# Patient Record
Sex: Female | Born: 1935 | Race: White | Hispanic: No | Smoking: Former smoker
Health system: Southern US, Community
[De-identification: ages and names within clinical notes are randomized; demographics above are authoritative.]

## PROBLEM LIST (undated history)

## (undated) DIAGNOSIS — I951 Orthostatic hypotension: Secondary | ICD-10-CM

## (undated) DIAGNOSIS — I442 Atrioventricular block, complete: Secondary | ICD-10-CM

## (undated) DIAGNOSIS — I495 Sick sinus syndrome: Secondary | ICD-10-CM

## (undated) DIAGNOSIS — M419 Scoliosis, unspecified: Secondary | ICD-10-CM

## (undated) DIAGNOSIS — Z950 Presence of cardiac pacemaker: Secondary | ICD-10-CM

## (undated) DIAGNOSIS — I480 Paroxysmal atrial fibrillation: Secondary | ICD-10-CM

## (undated) DIAGNOSIS — J449 Chronic obstructive pulmonary disease, unspecified: Secondary | ICD-10-CM

## (undated) DIAGNOSIS — M40204 Unspecified kyphosis, thoracic region: Secondary | ICD-10-CM

## (undated) DIAGNOSIS — R55 Syncope and collapse: Secondary | ICD-10-CM

## (undated) DIAGNOSIS — M3390 Dermatopolymyositis, unspecified, organ involvement unspecified: Secondary | ICD-10-CM

## (undated) DIAGNOSIS — I509 Heart failure, unspecified: Secondary | ICD-10-CM

## (undated) DIAGNOSIS — I10 Essential (primary) hypertension: Secondary | ICD-10-CM

## (undated) DIAGNOSIS — M3313 Other dermatomyositis without myopathy: Secondary | ICD-10-CM

## (undated) DIAGNOSIS — M353 Polymyalgia rheumatica: Secondary | ICD-10-CM

## (undated) HISTORY — DX: Unspecified kyphosis, thoracic region: M40.204

## (undated) HISTORY — DX: Dermatopolymyositis, unspecified, organ involvement unspecified: M33.90

## (undated) HISTORY — DX: Other dermatomyositis without myopathy: M33.13

## (undated) HISTORY — DX: Presence of cardiac pacemaker: Z95.0

## (undated) HISTORY — DX: Paroxysmal atrial fibrillation: I48.0

## (undated) HISTORY — DX: Atrioventricular block, complete: I44.2

## (undated) HISTORY — DX: Syncope and collapse: R55

## (undated) HISTORY — DX: Orthostatic hypotension: I95.1

## (undated) HISTORY — DX: Chronic obstructive pulmonary disease, unspecified: J44.9

## (undated) HISTORY — DX: Sick sinus syndrome: I49.5

## (undated) HISTORY — DX: Essential (primary) hypertension: I10

## (undated) HISTORY — DX: Scoliosis, unspecified: M41.9

---

## 1998-04-20 ENCOUNTER — Other Ambulatory Visit: Admission: RE | Admit: 1998-04-20 | Discharge: 1998-04-20 | Payer: Self-pay | Admitting: Gynecology

## 1999-05-10 ENCOUNTER — Other Ambulatory Visit: Admission: RE | Admit: 1999-05-10 | Discharge: 1999-05-10 | Payer: Self-pay | Admitting: Gynecology

## 2000-06-07 ENCOUNTER — Other Ambulatory Visit: Admission: RE | Admit: 2000-06-07 | Discharge: 2000-06-07 | Payer: Self-pay | Admitting: Gynecology

## 2001-07-04 ENCOUNTER — Other Ambulatory Visit: Admission: RE | Admit: 2001-07-04 | Discharge: 2001-07-04 | Payer: Self-pay | Admitting: Gynecology

## 2001-10-16 ENCOUNTER — Ambulatory Visit (HOSPITAL_COMMUNITY): Admission: RE | Admit: 2001-10-16 | Discharge: 2001-10-17 | Payer: Self-pay | Admitting: Cardiovascular Disease

## 2001-10-16 ENCOUNTER — Encounter: Payer: Self-pay | Admitting: Cardiovascular Disease

## 2002-07-22 ENCOUNTER — Other Ambulatory Visit: Admission: RE | Admit: 2002-07-22 | Discharge: 2002-07-22 | Payer: Self-pay | Admitting: Gynecology

## 2003-05-23 ENCOUNTER — Encounter: Admission: RE | Admit: 2003-05-23 | Discharge: 2003-05-23 | Payer: Self-pay | Admitting: Family Medicine

## 2003-07-24 ENCOUNTER — Other Ambulatory Visit: Admission: RE | Admit: 2003-07-24 | Discharge: 2003-07-24 | Payer: Self-pay | Admitting: Gynecology

## 2004-08-24 ENCOUNTER — Other Ambulatory Visit: Admission: RE | Admit: 2004-08-24 | Discharge: 2004-08-24 | Payer: Self-pay | Admitting: Gynecology

## 2005-05-24 ENCOUNTER — Encounter: Admission: RE | Admit: 2005-05-24 | Discharge: 2005-05-24 | Payer: Self-pay | Admitting: Family Medicine

## 2005-11-02 ENCOUNTER — Other Ambulatory Visit: Admission: RE | Admit: 2005-11-02 | Discharge: 2005-11-02 | Payer: Self-pay | Admitting: Gynecology

## 2006-11-08 ENCOUNTER — Other Ambulatory Visit: Admission: RE | Admit: 2006-11-08 | Discharge: 2006-11-08 | Payer: Self-pay | Admitting: Gynecology

## 2008-02-13 ENCOUNTER — Other Ambulatory Visit: Admission: RE | Admit: 2008-02-13 | Discharge: 2008-02-13 | Payer: Self-pay | Admitting: Gynecology

## 2008-03-04 ENCOUNTER — Ambulatory Visit: Payer: Self-pay | Admitting: Gynecology

## 2009-03-27 ENCOUNTER — Ambulatory Visit (HOSPITAL_COMMUNITY): Admission: RE | Admit: 2009-03-27 | Discharge: 2009-03-27 | Payer: Self-pay | Admitting: Cardiology

## 2009-03-27 HISTORY — PX: PERMANENT PACEMAKER GENERATOR CHANGE: SHX6022

## 2010-11-05 NOTE — Cardiovascular Report (Signed)
[place]  Patient:    KAMBRYN, DAPOLITO Visit Number: [id number] MRN: [id number]          Service Type: CAT Location: 3700 [phone] Attending Physician:  Ruta Hinds Dictated by:   Pearletha Furl Alanda Amass, M.D. Proc. Date: 10/16/01 Admit Date:  10/16/2001   CC:         Cardiopulmonary Lab  Duffy Rhody C. Andrey Campanile, M.D.  Lennette Bihari, M.D.   Cardiac Catheterization  PROCEDURES:  1. Explantation of end-of-life Biotronic GMNOS #168 pulse generator     (DOI August 02, 1990). 2. Implantation of new Medtronic DDDR KDR-901 Kappa pulse generator;    SN #[id number].  IMPLANTING PHYSICIAN:  Richard A. Alanda Amass, M.D.  COMPLICATIONS:  None.  ESTIMATED BLOOD LOSS:  Approximately 20 cc.  ANESTHESIA:  5 mg Valium p.o. premedication, 1% local Xylocaine, Total of 4 mg Nubain, 4 mg Versed for sedation.  EXISTING VENTRICULAR ELECTRODE:  Biotronic #[id number]; SN [id number]. ATRIAL ELECTRODE:  Pacesetter [id number]; SN [id number] (both implanted August 02, 1990).  MAGNET RATE:  BOL 85, at ROT magnet rate will drop to 65/min and pacer will revert to VVI mode.  PREOPERATIVE DIAGNOSES: 1. End-of-life pulse generator, Biotronic GMNOS with drop in magnet rate 11%    below program rate, end-of-life screen appearing on pacer interrogation and    reversion to VVI mode with secondary symptomatic fatigue x2 weeks --    outpatient teletrace follow-up diagnosis. 2. Systemic hypertension. 3. Venous edema. 4. Recurrent presyncope with documented 2:1 heart block, and CHB    August 02, 1990. 5. Pacemaker dependent on office follow-up. 6. Hyperlipidemia.  POSTOPERATIVE DIAGNOSES: 1. End-of-life pulse generator, Biotronic GMNOS with drop in magnet rate 11%    below program rate, end-of-life screen appearing on pacer interrogation and    reversion to VVI mode with secondary symptomatic fatigue x2 weeks --    outpatient teletrace follow-up diagnosis. 2. Systemic  hypertension. 3. Venous edema. 4. Recurrent presyncope with documented 2:1 heart block, and CHB    August 02, 1990. 5. Pacemaker dependent on office follow-up. 6. Hyperlipidemia.  The patient has reached end-of-life on her pulse generator, picked up on teletrace.  She had a magnet drop to 54, which was 11% below her PR of 65. Reversion to VVI mode with secondary CHF.  She is brought in now for elective generator replacement.  DESCRIPTION OF PROCEDURE:  The procedure was done on the 6th Floor EP Lab. The patient was in the postabsorptive state, premedicated with 5 mg Valium p.o.  Xylocaine 1% was used for local anesthesia, intermittent Versed and Nubain were given for sedation.  A right infraclavicular curvilinear transverse incision was performed below the patients previous incision.  It was brought down to the prepectoral fascia using blunt dissection, with electrocautery to control hemostasis. Prior to this, temporary pacemaker insertion was done through the RFV, using a balloon-tipped pacemaker positioned in the RV to provide backup pacing during the procedure and threshold testing.  The patient tolerated this well.  The generators fibrous capsule was exposed and incised carefully; opened using blunt dissection.  The generator was freed up.  The generator was delivered from the pocket.  The set screws were removed, two for each electrode; the hexnuts opened and the electrodes removed.  Inspection of the tip showed IS1 connectors; no blood in the leads and no insulation defects. Threshold testing was then performed, which showed excellent unipolar, bipolar, atrial and ventricular thresholds.  Retrograde conduction was done with ventricular pacing,  and atrial recordings showing no retrograde V-A conduction.  Pocket was irrigated with 500 mg kanamycin solution.  The patient was given 1 g of Ancef on call to the laboratory, and will receive 1 g postoperatively. The generator was  hooked to the electrode, and the proper atrial and ventricular sequence with the single hexnut tightened for each electrode.  It was delivered back into the pocket, with the electrodes looped behind; loosely secured to the posterior fibrous capsule and underlying muscle with a single #1 silk suture to prevent migration.  Sponge count was correct.  Subcutaneous tissue was closed with two separate running layers of 2-0 Dexon suture, and the skin was closed with 5-0 subcuticular Dexon sutures.  Steri-Strips were applied.  Fluoroscopy showed good position of the pulse generator and electrodes.  The patient tolerated the procedure well.  THRESHOLDS: 1. Unipolar atrial:  Threshold 0.5 V; resistance 427 ohms; P-waves 2.6 mV. 2. Bipolar atrial:  Threshold 0.7 V for capture; resistance 484 ohms;    P-waves 3.5 mV. 3. Ventricular unipolar:  Threshold 0.5 V; resistance 439 ohms; R-waves not    measured, since the patient was pacer dependent. 4. Ventricular bipolar:  Threshold 0.8 V; resistance 511 ohms; R-waves not    measured because the patient was pacer dependent.  MAGNET RATE (NEW GENERATOR):  BOL 85.  At ROT would drop to 65 and revert to VVI mode. Dictated by:   Pearletha Furl Alanda Amass, M.D. Attending Physician:  Ruta Hinds DD:  10/16/01 TD:  10/16/01 Job: [id number]

## 2011-01-13 ENCOUNTER — Emergency Department (HOSPITAL_COMMUNITY): Payer: Medicare Other

## 2011-01-13 ENCOUNTER — Inpatient Hospital Stay (HOSPITAL_COMMUNITY)
Admission: EM | Admit: 2011-01-13 | Discharge: 2011-01-18 | DRG: 481 | Disposition: A | Discharge status: Rehabilitation Facility | Payer: Medicare Other | Attending: Internal Medicine | Admitting: Internal Medicine

## 2011-01-13 DIAGNOSIS — T4275XA Adverse effect of unspecified antiepileptic and sedative-hypnotic drugs, initial encounter: Secondary | ICD-10-CM | POA: Diagnosis not present

## 2011-01-13 DIAGNOSIS — Z87891 Personal history of nicotine dependence: Secondary | ICD-10-CM

## 2011-01-13 DIAGNOSIS — D62 Acute posthemorrhagic anemia: Secondary | ICD-10-CM | POA: Diagnosis not present

## 2011-01-13 DIAGNOSIS — Z79899 Other long term (current) drug therapy: Secondary | ICD-10-CM

## 2011-01-13 DIAGNOSIS — Z950 Presence of cardiac pacemaker: Secondary | ICD-10-CM

## 2011-01-13 DIAGNOSIS — S72143A Displaced intertrochanteric fracture of unspecified femur, initial encounter for closed fracture: Principal | ICD-10-CM | POA: Diagnosis present

## 2011-01-13 DIAGNOSIS — Z7982 Long term (current) use of aspirin: Secondary | ICD-10-CM

## 2011-01-13 DIAGNOSIS — R0902 Hypoxemia: Secondary | ICD-10-CM | POA: Diagnosis not present

## 2011-01-13 DIAGNOSIS — I517 Cardiomegaly: Secondary | ICD-10-CM | POA: Diagnosis present

## 2011-01-13 DIAGNOSIS — J449 Chronic obstructive pulmonary disease, unspecified: Secondary | ICD-10-CM | POA: Diagnosis present

## 2011-01-13 DIAGNOSIS — I872 Venous insufficiency (chronic) (peripheral): Secondary | ICD-10-CM | POA: Diagnosis present

## 2011-01-13 DIAGNOSIS — W010XXA Fall on same level from slipping, tripping and stumbling without subsequent striking against object, initial encounter: Secondary | ICD-10-CM | POA: Diagnosis present

## 2011-01-13 DIAGNOSIS — J9819 Other pulmonary collapse: Secondary | ICD-10-CM | POA: Diagnosis not present

## 2011-01-13 DIAGNOSIS — E785 Hyperlipidemia, unspecified: Secondary | ICD-10-CM | POA: Diagnosis present

## 2011-01-13 DIAGNOSIS — F411 Generalized anxiety disorder: Secondary | ICD-10-CM | POA: Diagnosis present

## 2011-01-13 DIAGNOSIS — J4489 Other specified chronic obstructive pulmonary disease: Secondary | ICD-10-CM | POA: Diagnosis present

## 2011-01-13 DIAGNOSIS — K5900 Constipation, unspecified: Secondary | ICD-10-CM | POA: Diagnosis not present

## 2011-01-13 DIAGNOSIS — F19921 Other psychoactive substance use, unspecified with intoxication with delirium: Secondary | ICD-10-CM | POA: Diagnosis not present

## 2011-01-13 DIAGNOSIS — I10 Essential (primary) hypertension: Secondary | ICD-10-CM | POA: Diagnosis present

## 2011-01-13 LAB — DIFFERENTIAL
Basophils Absolute: 0 10*3/uL (ref 0.0–0.1)
Basophils Relative: 0 % (ref 0–1)
Eosinophils Absolute: 0.1 10*3/uL (ref 0.0–0.7)
Neutrophils Relative %: 84 % — ABNORMAL HIGH (ref 43–77)

## 2011-01-13 LAB — URINALYSIS, ROUTINE W REFLEX MICROSCOPIC
Ketones, ur: NEGATIVE mg/dL
Leukocytes, UA: NEGATIVE
Nitrite: NEGATIVE
Urobilinogen, UA: 0.2 mg/dL (ref 0.0–1.0)
pH: 7.5 (ref 5.0–8.0)

## 2011-01-13 LAB — BASIC METABOLIC PANEL
Calcium: 9.4 mg/dL (ref 8.4–10.5)
Chloride: 105 mEq/L (ref 96–112)
Creatinine, Ser: 0.64 mg/dL (ref 0.50–1.10)
GFR calc Af Amer: >60 mL/min (ref >60)
GFR calc non Af Amer: >60 mL/min (ref >60)

## 2011-01-13 LAB — CBC
Platelets: 268 10*3/uL (ref 150–400)
RBC: 4.57 MIL/uL (ref 3.87–5.11)
WBC: 10.9 10*3/uL — ABNORMAL HIGH (ref 4.0–10.5)

## 2011-01-13 LAB — PROTIME-INR: Prothrombin Time: 15 seconds (ref 11.6–15.2)

## 2011-01-13 NOTE — H&P (Signed)
[name], [name]               ACCOUNT NO.:  [id number]  MEDICAL RECORD NO.:  [id number]  LOCATION:  [facility]                         FACILITY:  [facility]  PHYSICIAN:  Pleas Koch, MD        DATE OF BIRTH:  1931-05-21  DATE OF ADMISSION:  01/13/2011 DATE OF DISCHARGE:                             HISTORY & PHYSICAL   CHIEF COMPLAINT:  Fall.  HISTORY OF PRESENT ILLNESS:  This is a very pleasant 77 year old Caucasian female who got up this morning to go to the restroom.  Her husband who has bladder cancer seems he had urinated on floor and as the patient was urgently going to the bathroom she slipped and fell and hurt herself and came to the emergency room.  There was no loss of consciousness, no chest pain, no shortness of breath, no palpitations, no bowel or bladder loss, no weakness.  She does most of her IADLs and ADLs by herself and is pretty healthy other than for some medical conditions.  PAST MEDICAL HISTORY:  She had a pacer which was placed in 1992 due to orthostatic hypotension, possible AV nodal block, which was replaced 3 years ago by Dr. Aleen Campi.  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  She has a history of hypertension, hyperlipidemia and has never had a heart attack in the past.  PAST SURGICAL HISTORY:  She has had lumbar disk surgery in the past for some sciatica.  She has had D and C in the past and has had a cyst removed as well.  MEDICATIONS:  Currently are: 1. Multivitamins. 2. Triamterene/hydrochlorothiazide 37.5/25 one tablet daily as needed. 3. Metoprolol 1000 units 1 tablet daily. 4. Aspirin 81 daily. 5. Zocor 20 mg 1 tablet daily. 6. Xanax 0.25 mg 1 tablet daily at bedtime as needed. 7. Vitamin B 50 mg 1 tablet daily. 8. Metoprolol tartrate 25 mg twice daily.  ALLERGIES:  She has no known drug allergies.  FAMILY HISTORY:  Her mother and father died of old age at 27 and 72. She has no siblings.  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  The patient has extensive smoking history smoked maybe 1 pack per day for the past 50 years.  She does drink occasionally.  PHYSICAL EXAMINATION:  VITAL SIGNS:  Temperature 98.1, blood pressure 145/71, pulse is 71, respirations 15, pain scale 5/10, O2 sats 94%. GENERAL:  She is an alert, oriented Caucasian female in some moderate distress with pain.  She has arcus. HEENT:  She has dentures in.  The throat is significantly clear with little bit of stigmata of possible bleeding. NECK:  Soft, supple.  I do not appreciate any carotid bruits although she does have elevation of her JVD on laying supine. CARDIAC:  She does have a possible ejection systolic murmur at the apex of the heart grade 2/3 on Fick.  I do not appreciate any dysrhythmia. ABDOMEN:  Soft, nontender, nondistended, and scaphoid.  She thinks she has lost weight recently. EXTREMITIES:  Her right leg is externally rotated and on movement, on slight movement causes her pain.  She is  able to raise her left leg without issue.  Pulses are equal bilaterally in the dorsalis pedis. NEUROLOGIC:  She is intact.  PERTINENT ADMISSION LABORATORY DATA:  CBC WBC 10.9, hemoglobin 14.4, hematocrit of 42.3, platelet count 268 with a predominant neutrophilia. Urinalysis was negative.  INR was 1.16.  BMET sodium 143, potassium 4.3, chloride 105, CO2 29, glucose 100, BUN 13, creatinine 0.64, calcium 9.4. She has also been typed and screened by the emergency room. Radiologically, she has an intertrochanteric and basicervical right femoral neck fracture noted on his x-ray.  No fractures also involved in femur.  Pelvis x-ray shows comminuted intertrochanteric basicervical right femoral neck fracture as detailed on hip x-ray.  Elbow x-ray showed no acute osseous abnormality.  Chest x-ray one-view showed cardiomegaly without pulmonary edema, hyperinflation consistent with COPD  or asthma.  No acute cardiopulmonary disease.  IMPRESSION/ASSESSMENT: 1. Based on surgery, the patient appears to be at moderate risk as     Orthopedic Surgery has classified as moderate risk for surgery.     She also does indeed have heart issues inclusive of a heart block     in any rhythm other than sinus rhythm, is at slightly higher risk     that moderate risk.  I do believe she could be cleared for surgery     but I would like Cardiology to make that assessment and plan as     well.  Certainly, in the perioperative period, she will require to     be on an anticoagulant as per discretion of orthopedic surgeon for     3-4 weeks and she is currently on a beta blocker.  She has no     diabetes mellitus.  She does have hypertension but has no other     organic medical problems which would preclude her other than the     heart issue.  She does on chest x-ray seem to have chronic     obstructive pulmonary disease and would benefit from incentive     spirometry subsequent to surgery. 2. Hypertension.  Her blood pressure is moderately controlled.  She     will continue on her metoprolol and we will hold her     triamterene/hydrochlorothiazide for now given the fact that this     could cause volume depletion. 3. Hyperlipidemia.  She will continue on her simvastatin as an     outpatient. 4. Sleep disorder.  She will continue alprazolam at night.  I will place parameters for blood pressure monitoring.  Dr. Cleophas Dunker has already been consulted by the emergency department.  I will consult [facility] regarding the patient to clear for surgery and hopefully she will have this done today and she will be kept n.p.o. in attendance of this.  For her pain, I will increase her from the morphine that she is on to Dilaudid and review and reassess her pain levels frequently and place on a bowel regimen.  We will get a CBC and BMET in the morning.  I have discussed  extensively with the patient and the daughter plan of care.  Daughter can reached at [phone].  Her name is [name].  The patient will be admitted to [facility] #6.  Please note that EKG was done January 13, 2011, a.m.  This showed essentially a dual paced rhythm and it is difficult to evaluate this rhythm compared to prior EKG done 2003.          ______________________________ Gerlean Ren  Mahala Menghini, MD     JS/MEDQ  D:  01/13/2011  T:  01/13/2011  Job:  [id number]  cc:   Hal T. Stoneking, M.D. Richard A. Alanda Amass, M.D.  Electronically Signed by Pleas Koch MD on 01/13/2011 04:46:10 PM

## 2011-01-14 LAB — DIFFERENTIAL
Basophils Absolute: 0 10*3/uL (ref 0.0–0.1)
Basophils Relative: 0 % (ref 0–1)
Eosinophils Absolute: 0 10*3/uL (ref 0.0–0.7)
Eosinophils Relative: 0 % (ref 0–5)
Monocytes Absolute: 0.7 10*3/uL (ref 0.1–1.0)
Neutro Abs: 6.4 10*3/uL (ref 1.7–7.7)

## 2011-01-14 LAB — BASIC METABOLIC PANEL
Calcium: 8.8 mg/dL (ref 8.4–10.5)
GFR calc Af Amer: >60 mL/min (ref >60)
GFR calc non Af Amer: >60 mL/min (ref >60)
Potassium: 4.1 mEq/L (ref 3.5–5.1)
Sodium: 137 mEq/L (ref 135–145)

## 2011-01-14 LAB — CBC
MCH: 30.6 pg (ref 26.0–34.0)
MCHC: 32.8 g/dL (ref 30.0–36.0)
Platelets: 205 10*3/uL (ref 150–400)
RDW: 14 % (ref 11.5–15.5)

## 2011-01-14 LAB — PROTIME-INR: Prothrombin Time: 16.9 seconds — ABNORMAL HIGH (ref 11.6–15.2)

## 2011-01-14 NOTE — Consult Note (Signed)
  NAMELATTIE, RIEGE NO.:  [id number]  MEDICAL RECORD NO.:  [id number]  LOCATION:  3728                         FACILITY:  [facility]  PHYSICIAN:  Claude Manges. Cleophas Dunker, M.D.DATE OF BIRTH:  Apr 28, 1931  DATE OF CONSULTATION: DATE OF DISCHARGE:                                CONSULTATION   REQUESTING PHYSICIAN:  Carleene Cooper, MD  CHIEF COMPLAINT:  Painful right hip.  HISTORY:  [name] is a very pleasant 77 year old white female who apparently slipped in the bathroom on some urine that was left by her husband who apparently has bladder cancer.  She did not see it on the floor, slipped, fell, landing onto her right hip.  She had immediate pain and discomfort and was unable to ambulate.  She was brought to the emergency room where she was noted to have an intertrochanteric hip fracture of the right hip.  We were asked for consultation because at their request.  She is seen today for evaluation.  She states her pain is moderately severe, it is more of an aching, throbbing pain with sharpness noted.  She denies any numbness or tingling.  No previous history of hip problems according to her since the day of evaluation.  Her past medical history, review of systems, family history, and social history were provided by the [facility] who were admitting physicians.  This was reviewed.  PHYSICAL EXAMINATION:  GENERAL:  A very pleasant 77 year old white female, well-developed, well-nourished, alert, cooperative, in moderate distress secondary to right hip pain. VITAL SIGNS:  Have been reviewed on the emergency room system. EXTREMITIES:  She has tenderness to palpation over the right hip.  She has also an externally rotated shortened right lower extremity.  There is good posterior tib pulse.  She is able to move the toes well. Sensation intact to light touch.  X-rays today reveals a intertrochanteric hip fracture with displacement of the right hip.  CLINICAL  IMPRESSION: 1. Displaced right intertrochanteric hip fracture. 2. Pacemaker.  RECOMMENDATIONS:  At this time, we have obtained clearances from [facility] medically, as well as Cardiology.  Therefore, we are going to proceed with an intramedullary nailing of the right hip to evaluate troch entry femoral nail.  Procedure risks and benefits have been fully explained to her and a gentleman who was with her which is her family.  They are understanding.  Once we were able to obtain operating room, we will proceed with this.  Thank you for this interesting consultation.     [name], P.A.-C.   ______________________________ Claude Manges. Cleophas Dunker, M.D.    BDP/MEDQ  D:  01/13/2011  T:  01/14/2011  Job:  [id number]  Electronically Signed by Jacqualine Code P.A.-C. on 01/14/2011 04:49:15 PM Electronically Signed by Norlene Campbell M.D. on 01/14/2011 04:51:48 PM

## 2011-01-14 NOTE — Op Note (Signed)
[name], MELCHER NO.:  [id number]  MEDICAL RECORD NO.:  [id number]  LOCATION:  3728                         FACILITY:  [facility]  PHYSICIAN:  Claude Manges. Etter Royall, M.D.DATE OF BIRTH:  Mar 18, 1931  DATE OF PROCEDURE:  01/13/2011 DATE OF DISCHARGE:                              OPERATIVE REPORT   PREOPERATIVE DIAGNOSIS:  Displaced two-part intertrochanteric fracture, right hip.  POSTOPERATIVE DIAGNOSIS:  Displaced two-part intertrochanteric fracture, right hip.  PROCEDURE:  Closed reduction and IM nailing, right hip fracture.  SURGEON:  Claude Manges. Cleophas Dunker, MD  ASSISTANT:  Oris Drone. Petrarca, PA-C  ANESTHESIA:  Spinal.  COMPLICATIONS:  None.  PROCEDURE IN DETAIL:  [name] has met with her family in the holding area.  I marked her right hip as the appropriate operative site.  The right lower extremity was short and externally rotated, showed good pulses.  [name] was then transported to room #5 and placed under spinal anesthetic by Dr. Jacklynn Bue.  She was then carefully transported to the operating fracture table.  The left lower extremity was padded and placed in 1990.  Reduction maneuver was performed of the right lower extremity with flexion, extension, internal rotation, and traction. Films revealed excellent position in both AP and lateral projections.  The right hip was then prepped with DuraPrep from iliac crest to below the knee.  Sterile draping was performed.  The tip of the greater trochanter was identified by image intensification.  About a 2-inch incision was made proximal to the tip of the greater trochanter and via sharp, dissection carried down to subcutaneous tissue.  Bleeding was controlled with the Bovie. Superficial and deep fascia was incised.  The muscle fascia was then incised and via blunt dissection, the tip of the greater trochanter was identified.  We utilized the [employer] S IM nail system.  The sharp-tipped awl was then  placed over the tip of the greater trochanter and checked in both AP and lateral projections and thought we had an excellent position.  A guidepin was then drilled into the tip of the trochanter through the proximal femur to the level of the lesser trochanter.  We checked this in both AP and lateral projections and thought we had an excellent position.  The proximal reamer was placed over the guidepin.  We reamed to 13.5 mm to the level of the lesser trochanter.  The initial guidepin was removed and the long ball-tip guidepin was then inserted to the level of the distal femoral metaphysis.  We measured a 36-cm nail.  The canal was quite wide.  We did insert one 13-mm reamer without any resistance and accordingly used the largest nail available in diameter, i.e. 11 mm.  This was carefully placed over the guidepin through the proximal hole in the greater trochanter and then advanced to the distal femur.  Under image intensification, we had an excellent position.  The tip was placed below the level of the greater trochanter. The external guide had been applied to the nail to ease the insertion. Using the external guide, we inserted a guidepin across the lateral femoral cortex through the femoral neck into the femoral head, placing it in inferior position to  the equator.  We checked in both AP and lateral projection and thought we had an excellent position.  We measured a 100-mm screw.  The reamer was then placed through the external guide and we measured again 100 mm and then inserted the 100-mm titanium screw to the lateral femoral cortex through the IM nail into the humeral head.  We had an excellent position again with both AP and lateral projections.  We then applied compression through the external guide and then tightened the screw proximally using the proximal screwdriver.  The construct was perfectly stable and there was no rotation.  At that point, we obtained final AP and lateral  hip projections and the hip fracture were perfectly stable and excellent position.  We did fix the device distally with a single transverse 40-mm 5-0 screw after appropriate drilling and measuring using the image intensification.  The external guide was removed.  The wounds were irrigated.  Simple skin stitch was placed over the incision distally.  The second incision for insertion of the lag screw was closed in 2 layers with 2-0 Vicryl and then skin clips.  The more proximal incision was closed with 0, 2-0 Vicryl and then skin clips.  Sterile bulky dressing was applied.  The patient tolerated the procedure well without complications and was returned to the postanesthesia recovery room without difficulty.     Claude Manges. Cleophas Dunker, M.D.     PWW/MEDQ  D:  01/13/2011  T:  01/14/2011  Job:  [id number]  Electronically Signed by Norlene Campbell M.D. on 01/14/2011 04:51:46 PM

## 2011-01-15 LAB — CBC
HCT: 31.2 % — ABNORMAL LOW (ref 36.0–46.0)
Hemoglobin: 10.3 g/dL — ABNORMAL LOW (ref 12.0–15.0)
MCHC: 33 g/dL (ref 30.0–36.0)
RDW: 13.8 % (ref 11.5–15.5)
WBC: 8.2 10*3/uL (ref 4.0–10.5)

## 2011-01-15 LAB — BASIC METABOLIC PANEL
CO2: 32 mEq/L (ref 19–32)
Calcium: 8.5 mg/dL (ref 8.4–10.5)
Creatinine, Ser: 0.5 mg/dL (ref 0.50–1.10)
GFR calc Af Amer: >60 mL/min (ref >60)
Sodium: 138 mEq/L (ref 135–145)

## 2011-01-15 LAB — DIFFERENTIAL
Basophils Absolute: 0 10*3/uL (ref 0.0–0.1)
Basophils Relative: 0 % (ref 0–1)
Lymphocytes Relative: 12 % (ref 12–46)
Neutro Abs: 6.2 10*3/uL (ref 1.7–7.7)

## 2011-01-15 LAB — PROTIME-INR
INR: 2.35 — ABNORMAL HIGH (ref 0.00–1.49)
Prothrombin Time: 26.1 seconds — ABNORMAL HIGH (ref 11.6–15.2)

## 2011-01-16 ENCOUNTER — Inpatient Hospital Stay (HOSPITAL_COMMUNITY): Payer: Medicare Other

## 2011-01-16 LAB — DIFFERENTIAL
Basophils Absolute: 0 10*3/uL (ref 0.0–0.1)
Basophils Relative: 0 % (ref 0–1)
Eosinophils Absolute: 0.2 10*3/uL (ref 0.0–0.7)
Monocytes Relative: 10 % (ref 3–12)
Neutro Abs: 6.4 10*3/uL (ref 1.7–7.7)
Neutrophils Relative %: 76 % (ref 43–77)

## 2011-01-16 LAB — URINALYSIS, MICROSCOPIC ONLY
Bilirubin Urine: NEGATIVE
Hgb urine dipstick: NEGATIVE
Specific Gravity, Urine: 1.008 (ref 1.005–1.030)
Urobilinogen, UA: 0.2 mg/dL (ref 0.0–1.0)
pH: 7 (ref 5.0–8.0)

## 2011-01-16 LAB — CBC
Hemoglobin: 10 g/dL — ABNORMAL LOW (ref 12.0–15.0)
Platelets: 202 10*3/uL (ref 150–400)
RBC: 3.28 MIL/uL — ABNORMAL LOW (ref 3.87–5.11)
WBC: 8.5 10*3/uL (ref 4.0–10.5)

## 2011-01-16 LAB — BASIC METABOLIC PANEL
BUN: 8 mg/dL (ref 6–23)
Chloride: 103 mEq/L (ref 96–112)
GFR calc Af Amer: >60 mL/min (ref >60)
GFR calc non Af Amer: >60 mL/min (ref >60)
Potassium: 3.8 mEq/L (ref 3.5–5.1)
Sodium: 140 mEq/L (ref 135–145)

## 2011-01-16 LAB — PROTIME-INR
INR: 2.72 — ABNORMAL HIGH (ref 0.00–1.49)
Prothrombin Time: 29.3 seconds — ABNORMAL HIGH (ref 11.6–15.2)

## 2011-01-17 DIAGNOSIS — S72143A Displaced intertrochanteric fracture of unspecified femur, initial encounter for closed fracture: Secondary | ICD-10-CM

## 2011-01-17 LAB — TYPE AND SCREEN
ABO/RH(D): A NEG
Antibody Screen: POSITIVE
Donor AG Type: NEGATIVE
Donor AG Type: NEGATIVE
Unit division: 0

## 2011-01-17 LAB — PROTIME-INR
INR: 2.21 — ABNORMAL HIGH (ref 0.00–1.49)
Prothrombin Time: 24.9 seconds — ABNORMAL HIGH (ref 11.6–15.2)

## 2011-01-18 ENCOUNTER — Inpatient Hospital Stay (HOSPITAL_COMMUNITY)
Admission: RE | Admit: 2011-01-18 | Discharge: 2011-01-24 | DRG: 945 | Disposition: A | Discharge status: Home Health | Payer: Medicare Other | Source: Ambulatory Visit | Attending: Physical Medicine & Rehabilitation | Admitting: Physical Medicine & Rehabilitation

## 2011-01-18 DIAGNOSIS — Z87891 Personal history of nicotine dependence: Secondary | ICD-10-CM

## 2011-01-18 DIAGNOSIS — Z8249 Family history of ischemic heart disease and other diseases of the circulatory system: Secondary | ICD-10-CM

## 2011-01-18 DIAGNOSIS — E785 Hyperlipidemia, unspecified: Secondary | ICD-10-CM

## 2011-01-18 DIAGNOSIS — S72009A Fracture of unspecified part of neck of unspecified femur, initial encounter for closed fracture: Secondary | ICD-10-CM

## 2011-01-18 DIAGNOSIS — J4489 Other specified chronic obstructive pulmonary disease: Secondary | ICD-10-CM

## 2011-01-18 DIAGNOSIS — F411 Generalized anxiety disorder: Secondary | ICD-10-CM

## 2011-01-18 DIAGNOSIS — W010XXA Fall on same level from slipping, tripping and stumbling without subsequent striking against object, initial encounter: Secondary | ICD-10-CM

## 2011-01-18 DIAGNOSIS — Z5189 Encounter for other specified aftercare: Principal | ICD-10-CM

## 2011-01-18 DIAGNOSIS — Y92009 Unspecified place in unspecified non-institutional (private) residence as the place of occurrence of the external cause: Secondary | ICD-10-CM

## 2011-01-18 DIAGNOSIS — Z79899 Other long term (current) drug therapy: Secondary | ICD-10-CM

## 2011-01-18 DIAGNOSIS — Z950 Presence of cardiac pacemaker: Secondary | ICD-10-CM

## 2011-01-18 DIAGNOSIS — J449 Chronic obstructive pulmonary disease, unspecified: Secondary | ICD-10-CM

## 2011-01-18 DIAGNOSIS — Z7901 Long term (current) use of anticoagulants: Secondary | ICD-10-CM

## 2011-01-18 DIAGNOSIS — D62 Acute posthemorrhagic anemia: Secondary | ICD-10-CM

## 2011-01-18 DIAGNOSIS — S72143A Displaced intertrochanteric fracture of unspecified femur, initial encounter for closed fracture: Secondary | ICD-10-CM

## 2011-01-18 DIAGNOSIS — I10 Essential (primary) hypertension: Secondary | ICD-10-CM

## 2011-01-18 DIAGNOSIS — I2510 Atherosclerotic heart disease of native coronary artery without angina pectoris: Secondary | ICD-10-CM

## 2011-01-18 DIAGNOSIS — K5900 Constipation, unspecified: Secondary | ICD-10-CM

## 2011-01-18 LAB — PROTIME-INR: Prothrombin Time: 25.6 seconds — ABNORMAL HIGH (ref 11.6–15.2)

## 2011-01-18 LAB — URINE CULTURE

## 2011-01-19 DIAGNOSIS — S72009A Fracture of unspecified part of neck of unspecified femur, initial encounter for closed fracture: Secondary | ICD-10-CM

## 2011-01-19 LAB — URINALYSIS, ROUTINE W REFLEX MICROSCOPIC
Glucose, UA: NEGATIVE mg/dL
Protein, ur: NEGATIVE mg/dL
Specific Gravity, Urine: 1.033 — ABNORMAL HIGH (ref 1.005–1.030)
Urobilinogen, UA: 1 mg/dL (ref 0.0–1.0)

## 2011-01-19 LAB — DIFFERENTIAL
Basophils Absolute: 0 10*3/uL (ref 0.0–0.1)
Basophils Relative: 1 % (ref 0–1)
Eosinophils Absolute: 0.2 10*3/uL (ref 0.0–0.7)
Eosinophils Relative: 3 % (ref 0–5)
Lymphocytes Relative: 20 % (ref 12–46)
Monocytes Absolute: 0.8 10*3/uL (ref 0.1–1.0)
Monocytes Relative: 13 % — ABNORMAL HIGH (ref 3–12)
Neutro Abs: 3.7 10*3/uL (ref 1.7–7.7)
Neutrophils Relative %: 63 % (ref 43–77)

## 2011-01-19 LAB — COMPREHENSIVE METABOLIC PANEL
Albumin: 2.6 g/dL — ABNORMAL LOW (ref 3.5–5.2)
BUN: 10 mg/dL (ref 6–23)
Calcium: 8.9 mg/dL (ref 8.4–10.5)
Chloride: 106 mEq/L (ref 96–112)
Creatinine, Ser: 0.55 mg/dL (ref 0.50–1.10)
GFR calc non Af Amer: >60 mL/min (ref >60)
Total Bilirubin: 0.9 mg/dL (ref 0.3–1.2)

## 2011-01-19 LAB — CBC
Hemoglobin: 10.3 g/dL — ABNORMAL LOW (ref 12.0–15.0)
MCH: 29.9 pg (ref 26.0–34.0)
MCHC: 32.2 g/dL (ref 30.0–36.0)
Platelets: 319 10*3/uL (ref 150–400)
RDW: 13.9 % (ref 11.5–15.5)

## 2011-01-19 LAB — PROTIME-INR: Prothrombin Time: 24.5 seconds — ABNORMAL HIGH (ref 11.6–15.2)

## 2011-01-19 LAB — URINE MICROSCOPIC-ADD ON

## 2011-01-20 DIAGNOSIS — S72009A Fracture of unspecified part of neck of unspecified femur, initial encounter for closed fracture: Secondary | ICD-10-CM

## 2011-01-20 LAB — PROTIME-INR: INR: 2.76 — ABNORMAL HIGH (ref 0.00–1.49)

## 2011-01-20 LAB — URINE CULTURE
Colony Count: 35000
Culture  Setup Time: 201208011144

## 2011-01-21 DIAGNOSIS — S72009A Fracture of unspecified part of neck of unspecified femur, initial encounter for closed fracture: Secondary | ICD-10-CM

## 2011-01-23 LAB — PROTIME-INR
INR: 2.2 — ABNORMAL HIGH (ref 0.00–1.49)
Prothrombin Time: 24.8 seconds — ABNORMAL HIGH (ref 11.6–15.2)

## 2011-01-24 DIAGNOSIS — S72009A Fracture of unspecified part of neck of unspecified femur, initial encounter for closed fracture: Secondary | ICD-10-CM

## 2011-01-24 LAB — PROTIME-INR
INR: 1.91 — ABNORMAL HIGH (ref 0.00–1.49)
Prothrombin Time: 22.2 seconds — ABNORMAL HIGH (ref 11.6–15.2)

## 2011-01-26 NOTE — Discharge Summary (Signed)
[name], [name]               ACCOUNT NO.:  [id number]  MEDICAL RECORD NO.:  [id number]  LOCATION:  5020                         FACILITY:  [facility]  PHYSICIAN:  Jeoffrey Massed, MD    DATE OF BIRTH:  Feb 06, 1931  DATE OF ADMISSION:  01/13/2011 DATE OF DISCHARGE:  01/18/2011                        DISCHARGE SUMMARY - REFERRING   PRIMARY CARE PRACTITIONER:  Hal T. Stoneking, MD  PRIMARY CARDIOLOGIST:  Pearletha Furl. Alanda Amass, MD  PRIMARY DISCHARGE DIAGNOSES: 1. Right hip fracture status post a closed reduction and     intramedullary nailing. 2. Transient delirium, probably secondary to narcotics, now resolved. 3. Transient hypoxia, probably secondary to underlying atelectasis,     now resolved.  SECONDARY DISCHARGE DIAGNOSES/PAST MEDICAL HISTORY: 1. Status post permanent pacemaker placement. 2. Hypertension. 3. Dyslipidemia. 4. History of chronic obstructive pulmonary disease. 5. History of chronic venous insufficiency. 6. History of anxiety.  ADDITIONAL DISCHARGE MEDICATIONS: 1. Tylenol 500 mg 1 tablet 4 times a day. 2. Albuterol inhaler 2 puffs inhaled q.4 h. p.r.n. 3. Lactulose 15 mL p.o. q.12 h 4. Magic mouthwash 5 mL p.o. 4 times a day p.r.n. 5. MiraLax 17 g p.o. twice daily. 6. Coumadin 2 mg 1 tablet p.o. daily per Pharmacy while at [facility] for DVT     prophylaxis. 7. Aspirin 81 mg 1 tablet daily. 8. Metoprolol 25 mg 1 tablet twice daily. 9. Multivitamins 1 tablet p.o. daily. 10.Vitamin D3 1000 units 1 tablet p.o. daily. 11.Vitamin B 50 mg 1 tablet p.o. daily. 12.Xanax 0.25 mg 1 tablet p.o. daily at bedtime. 13.Zocor 20 mg 1/2 tablet p.o. daily.  CONSULTANTS ON THE CASE: 1. [facility] 2. Dr. Cleophas Dunker from Orthopedics.  PROCEDURES PERFORMED:  The patient underwent a closed reduction and intramedullary nailing of the right hip fracture done by Dr. Norlene Campbell on January 13, 2011.  PERTINENT RADIOLOGICAL STUDIES: 1. X-ray of the right femur  done on January 13, 2011, shows     intertrochanteric and basicervical right femoral neck fracture. 2. X-ray of the pelvis showed to comminuted intertrochanteric and     basicervical right femoral neck fracture. 3. CT of the right hip showed right intertrochanteric fracture.     Bilateral hip osteoarthritis worse on the right.  BRIEF HISTORY OF PRESENT ILLNESS:  The patient is a very pleasant 77- year-old female with the above medical issues who fell on the day of admission and subsequently presented to the ED with the right hip pain and was found to have a right hip fracture and then admitted to the Medical Service for further evaluation and treatment.  Orthopedics was then consulted.  For further details, please see the history and physical that was dictated by Dr. Mahala Menghini on admission.  BRIEF HOSPITAL COURSE: 1. Right hip fracture.  This was secondary to a mechanical fall.  The     patient was seen by Orthopedics and taken to the operating room for     the hip repair as noted as above.  She was also seen preoperatively     by Cardiology and cardiology clearance was obtained.     Postoperatively, the patient has done well except for some mild     issues  with hypoxia and transient delirium all of which have     resolved.  The patient has been seen by [facility] and deemed a     candidate for acute rehab at [facility] and is being transferred there     today when a bed becomes available. 2. Transient delirium.  This is probably secondary to medications,     specifically her narcotics.  This is now resolved. 3. Transient hypoxia, probably secondary to underlying atelectasis and     underlying COPD.  This is also resolved.  The patient will need     outpatient pulmonary function tests and will need oxygen on a     p.r.n. basis to maintain O2 saturations more than 92%.  On     examination, her lungs are currently clear and she is very     comfortable without any active shortness of breath. 4.  Hypertension.  This is currently controlled with metoprolol.  Her     diuretics are currently on hold.  If her blood pressure does start     becoming an issue, she should probably be resumed on her diuretics     which include a combination of triamterene and hydrochlorothiazide. 5. The rest of her medical issues remained stable.  DISPOSITION:  It is felt that the patient is stable to be discharged to CIR today.  FOLLOWUP INSTRUCTIONS: 1. The patient will need to be followed up at Dr. Hoy Register office     in 8-10 days. 2. The patient will need to follow up with her primary care     practitioner in 1-2 weeks upon discharge from CIR. 3. The patient will need INR monitoring as she is on Coumadin for DVT     prophylaxis following a hip repair.     Jeoffrey Massed, MD    SG/MEDQ  D:  01/18/2011  T:  01/18/2011  Job:  [id number]  cc:   Hal T. Stoneking, M.D.  Electronically Signed by Jeoffrey Massed  on 01/26/2011 08:23:48 AM

## 2011-02-01 NOTE — H&P (Signed)
NAMECHRYSTIE, [name]               ACCOUNT NO.:  [id number]  MEDICAL RECORD NO.:  [id number]  LOCATION:  4147                         FACILITY:  [facility]  PHYSICIAN:  Ranelle Oyster, M.D.DATE OF BIRTH:  1931-06-11  DATE OF ADMISSION:  01/18/2011 DATE OF DISCHARGE:                             HISTORY & PHYSICAL   PRIMARY CARE PROVIDER:  Hal T. Stoneking, MD  CHIEF COMPLAINT:  Right hip pain.  HISTORY OF PRESENT ILLNESS:  This is a pleasant 77 year old white female who fell on July 26 sustaining a displaced 2-part intertrochanteric right hip fracture.  She underwent closed reduction and IM nailing on July 26 by Dr. Cleophas Dunker.  She is weightbearing as tolerated and on Coumadin for DVT prophylaxis.  The patient has had problems with postoperative encephalopathy and postoperative anemia.  She had issues with pain control as well.  Primary team asked the rehab service to evaluate this patient regarding inpatient rehab potential and I felt she could benefit from an inpatient rehab stay.  REVIEW OF SYSTEMS:  Notable for anxiety.  Shortness of breath is improved, although she had some oxygen desaturation on acute care. Right hip pain has been an issue particularly with movement and weakness in the right hip flexors.  She has had some constipation, resolved with bowel program.  Full 12-point review is in the written H and P.  Other pertinent positives above.  PAST MEDICAL HISTORY:  Positive for: 1. CAD with permanent pacer in 1992. 2. COPD. 3. Hypertension. 4. Hyperlipidemia. 5. Anxiety. 6. Lumbar disk surgery. 7. D and C.  She has remote history of tobacco use and occasionally has a drink of alcohol.  FAMILY HISTORY:  Positive for CAD and otherwise unremarkable.  SOCIAL HISTORY:  The patient is married.  Husband can assist at home. She lives in 1-level house with one-step to enter.  ALLERGIES:  None.  HOME MEDICATIONS:  Multivitamin, Maxzide, metoprolol, aspirin,  Zocor, Xanax, and vitamin D supplementation.  LABORATORY DATA:  Hemoglobin 10 and platelets 202.  Sodium 140, potassium 3.8, BUN 8, and creatinine 0.5.  INR is 2.29 as of today.  PHYSICAL EXAMINATION:  VITAL SIGNS:  Blood pressure is 139/83, pulse 66, respiratory rate 18, and temperature 98. GENERAL:  The patient is pleasant, alert, and oriented x3. HEENT:  Pupils equally round and reactive light.  Ear, nose, and throat exam notable for some irritation over the left gumline where her dentures meet as well as left lateral tongue.  She had full sets of dentures in place.  They seem to be appropriately fitting. NECK:  Supple without JVD or lymphadenopathy. CHEST:  Notable for few expiratory wheezes, but no obvious distress was seen.  No rales were heard. HEART:  Regular rate and rhythm without murmur, rubs, or gallop. EXTREMITIES:  No clubbing, cyanosis, or edema. ABDOMEN:  Soft and nontender.  Bowel sounds are positive. SKIN:  Her 3 skin incisions were clean and intact with staples and no drainage was seen.  These were covered with removal dressing. NEUROLOGIC:  Cranial nerves II through XII grossly intact.  Reflexes 1+. Sensation is normal in all 4 limbs.  Judgment, orientation, memory, and mood seem to be all appropriate today.  Strength was 4/5 both upper extremities, proximal and distal.  Right lower extremity hip flexion is 1 to 1+/5, knee extension 3/5 knee flexion 3/5.  The ankle dorsiflexion and plantar flexion grossly 4+ to 5/5.  POST ADMISSION PHYSICIAN EVALUATION: 1. Functional deficit secondary to 2-part right intertrochanteric hip     fracture.  The patient is status post closed reduction and     intramedullary nailing.  The patient with postoperative     encephalopathy. 2. The patient is admitted to receive collaborative interdisciplinary     care between the physiatrist, rehab nursing staff, and therapy     team. 3. The patient's level of medical complexity and  substantial therapy     needs in context so that medical necessity cannot be provided at a     lesser intensity of care.  The patient's medical issues bear     watching.  These are anticoagulation, postoperative anemia, history     of CAD with permanent pacemaker, hypertension, and COPD. 4. The patient has experienced substantial functional loss from her     baseline.  Premorbidly, she was independent.  Currently, she is mod     assist bed mobility, min assist transfer, min assist gait 50 feet     rolling walker, supervision upper body ADLs, max assist lower body     ADLs.  Judging by the patient's diagnosis, physical exam, and     functional history, she has potential for functional progress which     will result in measurable gains while inpatient rehab.  These gains     will be of substantial and practical use upon discharge to home     facilitating mobility and self-care. 5. The physiatrist will provide 24-hour management of medical needs as     well as oversight of therapy plan/treatment and provide guidance as     appropriate regarding interaction of the two.  Medical problem list     and plan are below. 6. The 24-hour rehab nursing team will assist in management of the     patient's skin care needs as well as pain management, integration     of therapy concepts, techniques, education, etc. 7. PT will assess and treat for lower extremity strength, particularly     the right hip flexors, pain management, adaptive techniques,     equipment, functional mobility, and safety with goals modified     independent for household distances. 8. OT will assess and treat for upper extremity use, ADLs, adaptive     techniques, functional mobility, safety, upper extremity strength,     safety overall and family/patient education with goals modified     independent to perhaps occasional set up for lower body ADLs. 9. Case management and social worker will assess and treat for     psychosocial  issues and discharge planning. 10.Team conference will be held weekly to assess progress towards     goals and to determine barriers at discharge. 11.The patient has demonstrated sufficient medical stability and     exercise capacity to tolerate at least 3 hours of therapy per day     at least 5 days week. 12.Estimated length of stay is 10 days.  Prognosis is good.  The     patient is motivated and should do very nicely.  MEDICAL PROBLEM LIST AND PLAN: 1. Deep vein thrombosis prophylaxis with Coumadin.  INR is 2.29.     Recheck hemoglobin on admission and follow up for any active signs  or symptoms of bleeding.  We will check INRs at least 3-5 days per     week. 2. Pain management:  We will use Tylenol primarily as her pain     management medication.  We will refrain from narcotics as possible     due to her intolerance in the recent past. 3. Mood:  We will use Xanax p.r.n. for anxiety flares and team will     provide ego support going forward. 4. Postoperative anemia:  We will check CBC tomorrow morning.  Again,     no active signs of bleeding not seen on exam here. 5. Coronary artery disease with permanent pacemaker.  The patient's     blood pressure and heart rate are under control as of this exam.     We will need to watch closely for intolerance of increased activity     and pain levels and effects of these upon her cardiovascular     system. 6. Blood pressure control as well as heart rate control with     Lopressor. 7. Constipation:  Add scheduled Senokot-S for regular movements.  She     had a large evacuation apparently yesterday. 8. Chronic obstructive pulmonary disease:  Encourage incentive     spirometry.  No inhalers or respiratory treatments needed at this     point.  We will check oxygen saturations with and without activity.     Ranelle Oyster, M.D.     ZTS/MEDQ  D:  01/18/2011  T:  01/19/2011  Job:  [id number]  cc:   Hal T. Stoneking,  M.D.  Electronically Signed by Faith Rogue M.D. on 02/01/2011 10:30:16 AM

## 2011-02-02 NOTE — Discharge Summary (Signed)
[name], [name]               ACCOUNT NO.:  [id number]  MEDICAL RECORD NO.:  [id number]  LOCATION:  4147                         FACILITY:  [facility]  PHYSICIAN:  Erick Colace, M.D.DATE OF BIRTH:  [birth year]  DATE OF ADMISSION:  01/18/2011 DATE OF DISCHARGE:  01/24/2011                              DISCHARGE SUMMARY   DISCHARGE DIAGNOSES: 1. Displaced right intertrochanteric hip fracture status post closed     reduction, intramedullary nailing, July 26. 2. Coumadin for deep vein thrombosis prophylaxis. 3. Pain management. 4. Anxiety. 5. Postoperative anemia. 6. Coronary artery disease with permanent pacemaker. 7. Hypertension. 8. Hyperlipidemia. 9. Constipation.  HISTORY:  This is a 77 year old white female admitted July 26 after a fall without loss of consciousness, sustained a displaced 2-part intertrochanteric hip fracture on the right.  Underwent closed reduction and intramedullary nailing July 26 by Dr. Cleophas Dunker.  Weightbearing as tolerated.  Coumadin for deep vein thrombosis prophylaxis. Postoperative anemia of 10 and monitored.  Bouts of mild confusion, monitored closely with limited use of narcotics.  She was minimal assist for ambulation.  She was admitted for comprehensive rehab program.  PAST MEDICAL HISTORY:  See discharge diagnoses.  ALLERGIES:  None.  SOCIAL HISTORY:  Remote tobacco.  Occasional alcohol.  She is married. Her husband can assist as needed, 1-level home, 1 step to entry.  FUNCTIONAL HISTORY PRIOR TO ADMISSION:  Independent.  FUNCTIONAL STATUS UPON ADMISSION TO REHAB SERVICES:  Moderate assist bed mobility, minimal assist transfer, minimal assist to ambulate 50 feet with a rolling walker, supervision upper body, max assist lower body, activities of daily living.  MEDICATIONS PRIOR TO ADMISSION: 1. Multivitamin. 2. Maxzide daily. 3. Metoprolol 25 mg twice daily. 4. Aspirin 81 mg daily. 5. Zocor 20 mg daily. 6. Xanax 0.25 mg at  bedtime as needed. 7. Vitamin.  PHYSICAL EXAMINATION:  VITAL SIGNS:  Blood pressure 139/83, pulse 66, temperature 98, respirations 18. GENERAL:  This was an alert female, in no acute distress, oriented x3. NEUROLOGIC:  Deep tendon reflexes 2+.  Sensation intact to light touch. EXTREMITIES:  Staples intact to right hip.  Surgical site without drainage. LUNGS:  Clear to auscultation. CARDIAC:  Regular rate and rhythm. ABDOMEN:  Soft, nontender.  Good bowel sounds.  REHABILITATION HOSPITAL COURSE:  The patient was admitted to inpatient rehab services with therapies initiated on a 3-hour daily basis consisting of physical therapy, occupational therapy and rehabilitation nursing.  The following issues were addressed during the patient's rehabilitation stay.  Pertaining Ms. Mano's right intertrochanteric hip fracture, she had undergone closed reduction, intramedullary nailing.  Surgical site healing nicely.  She is weightbearing as tolerated.  She was using Vicodin on a limited basis for pain.  She remained on Coumadin for deep vein thrombosis prophylaxis, latest INR of 2.20.  She will complete Coumadin protocol, August 27.  Home health nurse had been arranged for Coumadin therapy.  Postoperative anemia monitored closely.  Latest hemoglobin of 10.3.  She had no orthostatic changes.  No shortness of breath.  She did have a history of coronary artery disease with permanent pacemaker.  Her blood pressures are well controlled.  She was monitored closely with the use of Lopressor 25  mg daily.  She would remain on her Zocor for history of hyperlipidemia. She did have some mild constipation, resolved with laxative assistance. The patient received weekly collaborative interdisciplinary team conferences to discuss estimated length of stay, family teaching and barriers to discharge.  She was supervision upper body, minimal assist lower body, activities of daily living, minimal assist to ambulate  with a rolling walker as well as for overall transfers.  She exhibited no unsafe behavior.  She was continent of bowel and bladder.  She was discharged to home with home health therapies as recommended per rehab services after family teaching completed.  Latest labs showed an INR of 2.20.  Urine culture unremarkable.  Sodium 142, potassium 4.2, BUN 10, creatinine 0.5, hemoglobin 10.3, hematocrit of 32, platelet 319,000.  DISCHARGE MEDICATIONS AT TIME OF DICTATION: 1. Coumadin latest dose of 1.5 mg, adjusted accordingly for an INR of     2.0-3.0 with Coumadin to be completed on August 27. 2. Vitamin D 1000 units daily. 3. Lopressor 25 mg twice daily. 4. Multivitamin daily. 5. Zocor 10 mg daily. 6. Xanax 0.25 mg at bedtime as needed. 7. Vicodin 5/325 one tablet every 4 hours as needed pain, dispense of     60 tablets.  Her diet was regular.  Weightbearing as tolerated, right lower extremity.  The patient should follow up with Dr. Cleophas Dunker, Orthopedic Services 2 weeks, call for appointment; Dr. Merlene Laughter, Medical Management; Dr. Claudette Williamson, the outpatient rehab center as needed. Home health nurse had been arranged for INR, for Coumadin for DVT prophylaxis to be completed on August 27.  Home health therapies had been arranged.     [name], P.A.   ______________________________ Erick Colace, M.D.    DA/MEDQ  D:  01/24/2011  T:  01/24/2011  Job:  [id number]  cc:   Erick Colace, M.D. Hal T. Stoneking, M.D. Richard A. Alanda Amass, M.D. Claude Manges. Cleophas Dunker, M.D.  Electronically Signed by [name] P.A. on 01/24/2011 08:41:45 AM Electronically Signed by [name] M.D. on 02/02/2011 09:46:35 AM

## 2011-02-09 NOTE — Consult Note (Signed)
NAMEDILPREET, FAIRES               ACCOUNT NO.:  [id number]  MEDICAL RECORD NO.:  [id number]  LOCATION:  [facility]                         FACILITY:  [facility]  PHYSICIAN:  Jaylon Boylen A. Alanda Amass, M.D.DATE OF BIRTH:  28-May-1931  DATE OF CONSULTATION:  01/13/2011 DATE OF DISCHARGE:                                CONSULTATION   Ms. Hagmann is a delightful 77 year old female followed by Dr. Alanda Amass.  She has had a history of syncope and AV block.  She had a pacemaker implant in 1992 with a generator change in 2003 and her last generator change was in October 2010, with a [employer]. [employer] device.  She has no history of coronary disease.  She had a low-risk Myoview in 2008 and has not had anginal symptoms.  She recently saw Dr. Alanda Amass in March and was doing well.  She did have an echocardiogram in 2011, that showed good LV function.  Today, she apparently slipped on her bathroom floor and fractured her right hip. WE have been asked to see her for preop clearance.  She denies any chest pain.  She has not had any palpitations or tachycardia or syncopal symptoms.  PAST MEDICAL HISTORY:  Remarkable for treated hypertension.  She has treated dyslipidemia.  She has COPD, she has not smoked in 20 years. She has had problems with chronic venous insufficiency in the past.  HOME MEDICATIONS: 1. Multivitamins a day. 2. Maxzide 37.5/25 p.r.n. for edema. 3. Metoprolol 25 mg twice a day. 4. Aspirin 81 mg a day. 5. Zocor 20 mg a day. 6. Vitamin D. 7. Xanax p.r.n.  ALLERGIES:  She has no known drug allergies.  SOCIAL HISTORY:  She is married, her husband apparently has bladder cancer.  She is an ex-smoker, quit in 1994.  She has 2 children and 2 grandchildren.  Her daughter is a Research officer, political party.  FAMILY HISTORY:  Unremarkable for early coronary artery disease.  REVIEW OF SYSTEMS:  There is a past history of paroxysmal atrial fibrillation.  She has never had a stroke.  She has had carotid Dopplers in  2008, that showed mild disease and she does have bilateral carotid bruits.  She does not use oxygen at home.  PHYSICAL EXAMINATION:  VITAL SIGNS:  Blood pressure 110/62, pulse paced at 75 and respirations 16. GENERAL:  She is a able well-developed thin female, in no acute distress. HEENT:  Normocephalic.  Extraocular movements are intact.  Sclera is nonicteric.  Lids and conjunctivae are within normal limits. NECK:  Without JVD or bruit.  She does have bilateral carotid bruits. CHEST:  Diminished breath sounds without obvious wheezing. CARDIAC:  Regular rate and rhythm with a 1/6 systolic murmur at the left sternal border and aortic valve area. ABDOMEN:  Nontender and nondistended.  There is no obvious bruit or widened pulsation. EXTREMITIES:  Right hip fracture.  No edema. NEURO:  Grossly intact.  She is awake, alert, oriented and cooperative; moves all extremities without obvious deficit. SKIN:  Cool and dry.  LABORATORY DATA:  Sodium 143, potassium 4.3, BUN 13, creatinine 0.64, INR 1.16.  White count 10.9, hemoglobin 14.4, hematocrit 42.3 and platelets 268.  Chest x-ray shows COPD.  EKG is paced.  IMPRESSION: 1. Preoperative clearance for hip surgery. 2. History of syncope and atrioventricular block, status post     pacemaker in 1992, with subsequent generator changes, the last     generator change was October 2010 with a [employer]. Jude device. 3. Treated hypertension. 4. Treated dyslipidemia. 5. Chronic obstructive pulmonary disease, the patient has not smoked     in 20 years. 6. History of intermittent venous edema in the past. 7. Degenerative joint disease with remote lumbar spine surgery.  PLAN:  The patient will be evaluated by Dr. Kandis Cocking partner.  She should be in acceptable risk for surgery.  We will follow her perioperatively.     Abelino Derrick, P.A.   ______________________________ Pearletha Furl Alanda Amass, M.D.    Lenard Lance  D:  01/13/2011  T:  01/13/2011   Job:  [id number]  cc:   Hal T. Stoneking, M.D.  Electronically Signed by Corine Shelter P.A. on 01/20/2011 10:00:58 AM Electronically Signed by Susa Griffins M.D. on 02/09/2011 10:16:59 PM

## 2011-08-22 DIAGNOSIS — Z1331 Encounter for screening for depression: Secondary | ICD-10-CM | POA: Diagnosis not present

## 2011-08-22 DIAGNOSIS — Z0000 Encounter for general adult medical examination without abnormal findings: Secondary | ICD-10-CM | POA: Diagnosis not present

## 2011-09-05 DIAGNOSIS — I4891 Unspecified atrial fibrillation: Secondary | ICD-10-CM | POA: Diagnosis not present

## 2011-09-05 DIAGNOSIS — I498 Other specified cardiac arrhythmias: Secondary | ICD-10-CM | POA: Diagnosis not present

## 2011-09-05 DIAGNOSIS — I447 Left bundle-branch block, unspecified: Secondary | ICD-10-CM | POA: Diagnosis not present

## 2011-09-08 DIAGNOSIS — Z79899 Other long term (current) drug therapy: Secondary | ICD-10-CM | POA: Diagnosis not present

## 2011-09-08 DIAGNOSIS — R5381 Other malaise: Secondary | ICD-10-CM | POA: Diagnosis not present

## 2011-09-08 DIAGNOSIS — E782 Mixed hyperlipidemia: Secondary | ICD-10-CM | POA: Diagnosis not present

## 2011-09-08 DIAGNOSIS — R5383 Other fatigue: Secondary | ICD-10-CM | POA: Diagnosis not present

## 2011-09-13 DIAGNOSIS — E782 Mixed hyperlipidemia: Secondary | ICD-10-CM | POA: Diagnosis not present

## 2011-09-13 DIAGNOSIS — Z950 Presence of cardiac pacemaker: Secondary | ICD-10-CM | POA: Diagnosis not present

## 2011-09-13 DIAGNOSIS — I447 Left bundle-branch block, unspecified: Secondary | ICD-10-CM | POA: Diagnosis not present

## 2011-09-13 DIAGNOSIS — I10 Essential (primary) hypertension: Secondary | ICD-10-CM | POA: Diagnosis not present

## 2011-09-13 HISTORY — PX: NM MYOCAR PERF WALL MOTION: HXRAD629

## 2011-09-26 DIAGNOSIS — R5381 Other malaise: Secondary | ICD-10-CM | POA: Diagnosis not present

## 2011-12-02 DIAGNOSIS — Z1231 Encounter for screening mammogram for malignant neoplasm of breast: Secondary | ICD-10-CM | POA: Diagnosis not present

## 2012-02-01 DIAGNOSIS — H26499 Other secondary cataract, unspecified eye: Secondary | ICD-10-CM | POA: Diagnosis not present

## 2012-03-08 DIAGNOSIS — I4891 Unspecified atrial fibrillation: Secondary | ICD-10-CM | POA: Diagnosis not present

## 2012-03-08 DIAGNOSIS — I495 Sick sinus syndrome: Secondary | ICD-10-CM | POA: Diagnosis not present

## 2012-03-20 DIAGNOSIS — L570 Actinic keratosis: Secondary | ICD-10-CM | POA: Diagnosis not present

## 2012-03-20 DIAGNOSIS — D0439 Carcinoma in situ of skin of other parts of face: Secondary | ICD-10-CM | POA: Diagnosis not present

## 2012-03-20 DIAGNOSIS — C4359 Malignant melanoma of other part of trunk: Secondary | ICD-10-CM | POA: Diagnosis not present

## 2012-03-20 DIAGNOSIS — L821 Other seborrheic keratosis: Secondary | ICD-10-CM | POA: Diagnosis not present

## 2012-03-28 DIAGNOSIS — I495 Sick sinus syndrome: Secondary | ICD-10-CM | POA: Diagnosis not present

## 2012-03-28 DIAGNOSIS — I4891 Unspecified atrial fibrillation: Secondary | ICD-10-CM | POA: Diagnosis not present

## 2012-03-28 HISTORY — PX: US ECHOCARDIOGRAPHY: HXRAD669

## 2012-04-03 DIAGNOSIS — C4359 Malignant melanoma of other part of trunk: Secondary | ICD-10-CM | POA: Diagnosis not present

## 2012-04-03 DIAGNOSIS — D485 Neoplasm of uncertain behavior of skin: Secondary | ICD-10-CM | POA: Diagnosis not present

## 2012-05-27 DIAGNOSIS — J069 Acute upper respiratory infection, unspecified: Secondary | ICD-10-CM | POA: Diagnosis not present

## 2012-06-06 DIAGNOSIS — Z23 Encounter for immunization: Secondary | ICD-10-CM | POA: Diagnosis not present

## 2012-07-19 ENCOUNTER — Other Ambulatory Visit (HOSPITAL_COMMUNITY): Payer: Self-pay | Admitting: Cardiovascular Disease

## 2012-07-19 DIAGNOSIS — J449 Chronic obstructive pulmonary disease, unspecified: Secondary | ICD-10-CM | POA: Diagnosis not present

## 2012-07-19 DIAGNOSIS — E782 Mixed hyperlipidemia: Secondary | ICD-10-CM | POA: Diagnosis not present

## 2012-07-19 DIAGNOSIS — I729 Aneurysm of unspecified site: Secondary | ICD-10-CM

## 2012-07-19 DIAGNOSIS — R0989 Other specified symptoms and signs involving the circulatory and respiratory systems: Secondary | ICD-10-CM

## 2012-07-19 DIAGNOSIS — I4891 Unspecified atrial fibrillation: Secondary | ICD-10-CM | POA: Diagnosis not present

## 2012-07-23 DIAGNOSIS — R5381 Other malaise: Secondary | ICD-10-CM | POA: Diagnosis not present

## 2012-07-23 DIAGNOSIS — E119 Type 2 diabetes mellitus without complications: Secondary | ICD-10-CM | POA: Diagnosis not present

## 2012-07-23 DIAGNOSIS — R6889 Other general symptoms and signs: Secondary | ICD-10-CM | POA: Diagnosis not present

## 2012-07-23 DIAGNOSIS — E782 Mixed hyperlipidemia: Secondary | ICD-10-CM | POA: Diagnosis not present

## 2012-08-06 DIAGNOSIS — R5383 Other fatigue: Secondary | ICD-10-CM | POA: Diagnosis not present

## 2012-08-09 ENCOUNTER — Encounter (HOSPITAL_COMMUNITY): Payer: Medicare Other

## 2012-08-15 ENCOUNTER — Encounter (HOSPITAL_COMMUNITY): Payer: Medicare Other

## 2012-08-24 ENCOUNTER — Encounter (HOSPITAL_COMMUNITY): Payer: Medicare Other

## 2012-08-27 DIAGNOSIS — I10 Essential (primary) hypertension: Secondary | ICD-10-CM | POA: Diagnosis not present

## 2012-08-27 DIAGNOSIS — Z1331 Encounter for screening for depression: Secondary | ICD-10-CM | POA: Diagnosis not present

## 2012-08-27 DIAGNOSIS — Z0000 Encounter for general adult medical examination without abnormal findings: Secondary | ICD-10-CM | POA: Diagnosis not present

## 2012-08-29 ENCOUNTER — Ambulatory Visit (HOSPITAL_COMMUNITY)
Admission: RE | Admit: 2012-08-29 | Discharge: 2012-08-29 | Disposition: A | Discharge status: Home / Self Care | Payer: Medicare Other | Source: Ambulatory Visit | Attending: Cardiovascular Disease | Admitting: Cardiovascular Disease

## 2012-08-29 DIAGNOSIS — R0989 Other specified symptoms and signs involving the circulatory and respiratory systems: Secondary | ICD-10-CM | POA: Diagnosis not present

## 2012-08-29 DIAGNOSIS — I729 Aneurysm of unspecified site: Secondary | ICD-10-CM

## 2012-08-29 NOTE — Progress Notes (Signed)
Carotid Duplex Complete [name] 

## 2012-08-29 NOTE — Progress Notes (Signed)
Aorta Duplex Completed. Rollen Selders D  

## 2012-09-04 DIAGNOSIS — H60399 Other infective otitis externa, unspecified ear: Secondary | ICD-10-CM | POA: Diagnosis not present

## 2012-09-04 DIAGNOSIS — J209 Acute bronchitis, unspecified: Secondary | ICD-10-CM | POA: Diagnosis not present

## 2012-09-04 DIAGNOSIS — H6120 Impacted cerumen, unspecified ear: Secondary | ICD-10-CM | POA: Diagnosis not present

## 2012-09-11 DIAGNOSIS — I959 Hypotension, unspecified: Secondary | ICD-10-CM | POA: Diagnosis not present

## 2012-09-11 DIAGNOSIS — R5383 Other fatigue: Secondary | ICD-10-CM | POA: Diagnosis not present

## 2012-09-11 DIAGNOSIS — R5381 Other malaise: Secondary | ICD-10-CM | POA: Diagnosis not present

## 2012-09-12 DIAGNOSIS — E860 Dehydration: Secondary | ICD-10-CM | POA: Diagnosis not present

## 2012-09-13 DIAGNOSIS — I495 Sick sinus syndrome: Secondary | ICD-10-CM | POA: Diagnosis not present

## 2012-09-13 DIAGNOSIS — R6889 Other general symptoms and signs: Secondary | ICD-10-CM | POA: Diagnosis not present

## 2012-09-13 DIAGNOSIS — J449 Chronic obstructive pulmonary disease, unspecified: Secondary | ICD-10-CM | POA: Diagnosis not present

## 2012-09-14 DIAGNOSIS — I951 Orthostatic hypotension: Secondary | ICD-10-CM | POA: Diagnosis not present

## 2012-09-27 DIAGNOSIS — I951 Orthostatic hypotension: Secondary | ICD-10-CM | POA: Diagnosis not present

## 2012-10-25 DIAGNOSIS — I951 Orthostatic hypotension: Secondary | ICD-10-CM | POA: Diagnosis not present

## 2012-11-01 ENCOUNTER — Other Ambulatory Visit: Payer: Self-pay | Admitting: *Deleted

## 2012-11-01 ENCOUNTER — Other Ambulatory Visit: Payer: Self-pay | Admitting: Cardiovascular Disease

## 2012-11-01 DIAGNOSIS — I472 Ventricular tachycardia: Secondary | ICD-10-CM | POA: Diagnosis not present

## 2012-11-01 DIAGNOSIS — J449 Chronic obstructive pulmonary disease, unspecified: Secondary | ICD-10-CM | POA: Diagnosis not present

## 2012-11-01 DIAGNOSIS — Z950 Presence of cardiac pacemaker: Secondary | ICD-10-CM | POA: Diagnosis not present

## 2012-11-01 DIAGNOSIS — R6889 Other general symptoms and signs: Secondary | ICD-10-CM | POA: Diagnosis not present

## 2012-11-01 DIAGNOSIS — R5381 Other malaise: Secondary | ICD-10-CM | POA: Diagnosis not present

## 2012-11-01 DIAGNOSIS — D510 Vitamin B12 deficiency anemia due to intrinsic factor deficiency: Secondary | ICD-10-CM | POA: Diagnosis not present

## 2012-11-01 DIAGNOSIS — I959 Hypotension, unspecified: Secondary | ICD-10-CM | POA: Diagnosis not present

## 2012-11-01 LAB — CBC WITH DIFFERENTIAL/PLATELET
Basophils Absolute: 0 10*3/uL (ref 0.0–0.1)
Eosinophils Relative: 2 % (ref 0–5)
Lymphocytes Relative: 24 % (ref 12–46)
Neutro Abs: 5.7 10*3/uL (ref 1.7–7.7)
Platelets: 299 10*3/uL (ref 150–400)
RDW: 14.1 % (ref 11.5–15.5)
WBC: 8.5 10*3/uL (ref 4.0–10.5)

## 2012-11-02 LAB — COMPREHENSIVE METABOLIC PANEL WITH GFR
ALT: 14 U/L (ref 0–35)
AST: 22 U/L (ref 0–37)
Albumin: 4 g/dL (ref 3.5–5.2)
Alkaline Phosphatase: 77 U/L (ref 39–117)
BUN: 14 mg/dL (ref 6–23)
CO2: 28 meq/L (ref 19–32)
Calcium: 9.3 mg/dL (ref 8.4–10.5)
Chloride: 104 meq/L (ref 96–112)
Creat: 0.67 mg/dL (ref 0.50–1.10)
Glucose, Bld: 95 mg/dL (ref 70–99)
Potassium: 4.2 meq/L (ref 3.5–5.3)
Sodium: 143 meq/L (ref 135–145)
Total Bilirubin: 0.7 mg/dL (ref 0.3–1.2)
Total Protein: 6.3 g/dL (ref 6.0–8.3)

## 2012-11-02 LAB — CORTISOL: Cortisol, Plasma: 7.8 ug/dL

## 2012-11-02 LAB — VITAMIN B12: Vitamin B-12: 1783 pg/mL — ABNORMAL HIGH (ref 211–911)

## 2012-11-14 ENCOUNTER — Encounter: Payer: Self-pay | Admitting: Cardiovascular Disease

## 2012-11-27 DIAGNOSIS — I951 Orthostatic hypotension: Secondary | ICD-10-CM | POA: Diagnosis not present

## 2012-11-27 DIAGNOSIS — F329 Major depressive disorder, single episode, unspecified: Secondary | ICD-10-CM | POA: Diagnosis not present

## 2012-11-27 DIAGNOSIS — I10 Essential (primary) hypertension: Secondary | ICD-10-CM | POA: Diagnosis not present

## 2012-12-19 DIAGNOSIS — F329 Major depressive disorder, single episode, unspecified: Secondary | ICD-10-CM | POA: Diagnosis not present

## 2013-01-02 DIAGNOSIS — I951 Orthostatic hypotension: Secondary | ICD-10-CM | POA: Diagnosis not present

## 2013-01-02 DIAGNOSIS — R634 Abnormal weight loss: Secondary | ICD-10-CM | POA: Diagnosis not present

## 2013-01-06 DIAGNOSIS — H6120 Impacted cerumen, unspecified ear: Secondary | ICD-10-CM | POA: Diagnosis not present

## 2013-01-31 DIAGNOSIS — I10 Essential (primary) hypertension: Secondary | ICD-10-CM | POA: Diagnosis not present

## 2013-01-31 DIAGNOSIS — Z634 Disappearance and death of family member: Secondary | ICD-10-CM | POA: Diagnosis not present

## 2013-01-31 DIAGNOSIS — G479 Sleep disorder, unspecified: Secondary | ICD-10-CM | POA: Diagnosis not present

## 2013-01-31 DIAGNOSIS — R634 Abnormal weight loss: Secondary | ICD-10-CM | POA: Diagnosis not present

## 2013-02-21 DIAGNOSIS — J449 Chronic obstructive pulmonary disease, unspecified: Secondary | ICD-10-CM | POA: Diagnosis not present

## 2013-02-21 DIAGNOSIS — Z950 Presence of cardiac pacemaker: Secondary | ICD-10-CM | POA: Diagnosis not present

## 2013-02-21 DIAGNOSIS — I472 Ventricular tachycardia: Secondary | ICD-10-CM | POA: Diagnosis not present

## 2013-02-21 DIAGNOSIS — Z45018 Encounter for adjustment and management of other part of cardiac pacemaker: Secondary | ICD-10-CM | POA: Diagnosis not present

## 2013-02-21 LAB — PACEMAKER DEVICE OBSERVATION

## 2013-03-04 ENCOUNTER — Telehealth: Payer: Self-pay | Admitting: Cardiovascular Disease

## 2013-03-04 NOTE — Telephone Encounter (Signed)
Pt was calling to find out what doctor she needs to see now that Dr. Alanda Amass has retired

## 2013-03-04 NOTE — Telephone Encounter (Signed)
Pt wanted to speak to JC or Dr. Alanda Amass only about who she needs to see now that he is retiring.

## 2013-03-05 NOTE — Telephone Encounter (Signed)
Attempted to call pt. No answer.

## 2013-03-07 ENCOUNTER — Telehealth: Payer: Self-pay | Admitting: Cardiovascular Disease

## 2013-03-07 NOTE — Telephone Encounter (Signed)
Patient states that she called 4 days ago and has not been called back.

## 2013-03-07 NOTE — Telephone Encounter (Signed)
Printed note gave to [facility]  -ask Dr Alanda Amass

## 2013-03-14 NOTE — Telephone Encounter (Signed)
Talked to pt. About her next appt. With Dr. Rennis Golden

## 2013-03-19 ENCOUNTER — Encounter: Payer: Self-pay | Admitting: Cardiovascular Disease

## 2013-04-01 ENCOUNTER — Telehealth: Payer: Self-pay | Admitting: Cardiovascular Disease

## 2013-04-01 NOTE — Telephone Encounter (Signed)
Wants to get JC advice on which dr to see since RAW gone  Please call . Will be home til 2 pm.

## 2013-04-01 NOTE — Telephone Encounter (Signed)
Pt. Called and questions answered about Dr. Royann Shivers

## 2013-04-04 DIAGNOSIS — I10 Essential (primary) hypertension: Secondary | ICD-10-CM | POA: Diagnosis not present

## 2013-04-04 DIAGNOSIS — Z23 Encounter for immunization: Secondary | ICD-10-CM | POA: Diagnosis not present

## 2013-04-04 DIAGNOSIS — G479 Sleep disorder, unspecified: Secondary | ICD-10-CM | POA: Diagnosis not present

## 2013-04-04 DIAGNOSIS — F329 Major depressive disorder, single episode, unspecified: Secondary | ICD-10-CM | POA: Diagnosis not present

## 2013-04-04 DIAGNOSIS — Z79899 Other long term (current) drug therapy: Secondary | ICD-10-CM | POA: Diagnosis not present

## 2013-04-04 DIAGNOSIS — Z634 Disappearance and death of family member: Secondary | ICD-10-CM | POA: Diagnosis not present

## 2013-04-04 DIAGNOSIS — E441 Mild protein-calorie malnutrition: Secondary | ICD-10-CM | POA: Diagnosis not present

## 2013-04-23 ENCOUNTER — Telehealth: Payer: Self-pay | Admitting: Internal Medicine

## 2013-04-23 NOTE — Telephone Encounter (Signed)
Pt has Jan recall with Dr. Ernestina Columbia

## 2013-05-13 ENCOUNTER — Other Ambulatory Visit: Payer: Self-pay | Admitting: Cardiovascular Disease

## 2013-05-13 NOTE — Telephone Encounter (Signed)
Please advise 

## 2013-06-10 DIAGNOSIS — I951 Orthostatic hypotension: Secondary | ICD-10-CM | POA: Diagnosis not present

## 2013-06-10 DIAGNOSIS — G479 Sleep disorder, unspecified: Secondary | ICD-10-CM | POA: Diagnosis not present

## 2013-06-10 DIAGNOSIS — R634 Abnormal weight loss: Secondary | ICD-10-CM | POA: Diagnosis not present

## 2013-07-09 ENCOUNTER — Encounter: Payer: Self-pay | Admitting: *Deleted

## 2013-07-10 ENCOUNTER — Ambulatory Visit (INDEPENDENT_AMBULATORY_CARE_PROVIDER_SITE_OTHER): Payer: Medicare Other | Admitting: Cardiovascular Disease

## 2013-07-10 ENCOUNTER — Encounter: Payer: Self-pay | Admitting: Cardiovascular Disease

## 2013-07-10 VITALS — BP 114/65 | HR 70 | Resp 20 | Ht 65.5 in | Wt 116.7 lb

## 2013-07-10 DIAGNOSIS — I442 Atrioventricular block, complete: Secondary | ICD-10-CM | POA: Diagnosis not present

## 2013-07-10 DIAGNOSIS — R0602 Shortness of breath: Secondary | ICD-10-CM | POA: Diagnosis not present

## 2013-07-10 DIAGNOSIS — I495 Sick sinus syndrome: Secondary | ICD-10-CM

## 2013-07-10 DIAGNOSIS — I951 Orthostatic hypotension: Secondary | ICD-10-CM

## 2013-07-10 DIAGNOSIS — I359 Nonrheumatic aortic valve disorder, unspecified: Secondary | ICD-10-CM | POA: Diagnosis not present

## 2013-07-10 DIAGNOSIS — I351 Nonrheumatic aortic (valve) insufficiency: Secondary | ICD-10-CM

## 2013-07-10 DIAGNOSIS — Z950 Presence of cardiac pacemaker: Secondary | ICD-10-CM | POA: Diagnosis not present

## 2013-07-10 LAB — PACEMAKER DEVICE OBSERVATION

## 2013-07-10 NOTE — Patient Instructions (Signed)
Dr. Sallyanne Kuster has requested that you have an echocardiogram. Echocardiography is a painless test that uses sound waves to create images of your heart. It provides your doctor with information about the size and shape of your heart and how well your heart's chambers and valves are working. This procedure takes approximately one hour. There are no restrictions for this procedure.  Remote monitoring is used to monitor your Pacemaker of ICD from home. This monitoring reduces the number of office visits required to check your device to one time per year. It allows [place] to keep an eye on the functioning of your device to ensure it is working properly. You are scheduled for a device check from home on October 12, 2013. You may send your transmission at any time that day. If you have a wireless device, the transmission will be sent automatically. After your physician reviews your transmission, you will receive a postcard with your next transmission date.  Dr. Sallyanne Kuster recommends that you schedule a follow-up appointment in: 6 months for an office visit and pacemaker interrogation.

## 2013-07-13 ENCOUNTER — Encounter: Payer: Self-pay | Admitting: Cardiovascular Disease

## 2013-07-13 DIAGNOSIS — I351 Nonrheumatic aortic (valve) insufficiency: Secondary | ICD-10-CM | POA: Insufficient documentation

## 2013-07-13 DIAGNOSIS — Z950 Presence of cardiac pacemaker: Secondary | ICD-10-CM

## 2013-07-13 DIAGNOSIS — I442 Atrioventricular block, complete: Secondary | ICD-10-CM | POA: Insufficient documentation

## 2013-07-13 DIAGNOSIS — I495 Sick sinus syndrome: Secondary | ICD-10-CM

## 2013-07-13 DIAGNOSIS — I951 Orthostatic hypotension: Secondary | ICD-10-CM | POA: Insufficient documentation

## 2013-07-13 HISTORY — DX: Presence of cardiac pacemaker: Z95.0

## 2013-07-13 HISTORY — DX: Sick sinus syndrome: I49.5

## 2013-07-13 HISTORY — DX: Atrioventricular block, complete: I44.2

## 2013-07-13 NOTE — Assessment & Plan Note (Signed)
Her echocardiograms have shown gradual worsening of this valvular problem and I think it would be useful to review her aortic insufficiency again. This is especially important since the most recent assessment of left ventricle systolic function by nuclear stress testing suggested mildly depressed EF of 47%. The decrease in EF could also be due to RV apical pacing related synchrony. She'll be scheduled for an echocardiogram.

## 2013-07-13 NOTE — Progress Notes (Signed)
Patient ID: [name], female   DOB: Nov 30, 1930, 80 y.o.   MRN: [id number]      Reason for office visit New cardiology followup, pacemaker check, aortic insufficiency, orthostatic hypotension  [name] is a long-time patient of Dr. Terance Ice, who recently retired.  She has a history of orthostatic hypotension, sinus node dysfunction and complete heart block, status post dual-chamber pacemaker (St. Jude, 2010), diabetes mellitus controlled with diet and hyperlipidemia. She has moderate aortic insufficiency by echocardiography but has a normal size left ventricle with normal left ventricular function and no other significant valvular abnormalities.  She has no cardiovascular complaints. She is due reading after the loss of her husband of almost 46 years. He passed away last 02/03/2023. She is still living in their original home and has good support from her family.  Review of her echocardiograms performed over the last few years show a gradual worsening of the aortic insufficiency which was trace in 2005 & 2009, mild in 2010, mild to moderate in 2011 and moderate in October, 2013 (most recent assessment).  She does not have known coronary artery disease and had a quasinormal nuclear stress test in March of 2013(pacing-related artifact).  Her original pacemaker and the current leads were implanted in 1992. She has had 2 generator change out, most recently in 2010. Her device is a Buyer, retail 2110 non RF dual-chamber pacemaker with a battery longevity estimated at about 7 years. The atrial lead is a St. Jude 1572 and the ventricular lead was a Biotronik [id number].  She is pacemaker dependent, with a rather unreliable idioventricular escape rhythm at 36 beats per minute. She paces the atrium 97% of the time and paces the ventricle 100% of the time. Interrogation of her pacemaker also shows very brief episodes of tachycardia with atrial rates of up to 190 beats per minute, lasting for at  most 12 seconds.   No Known Allergies  Current Outpatient Prescriptions  Medication Sig Dispense Refill  . ALPRAZolam (XANAX) 0.25 MG tablet Take 0.25 mg by mouth at bedtime as needed for sleep.      [name] aspirin 81 MG tablet Take 81 mg by mouth daily.      . cholecalciferol (VITAMIN D) 1000 UNITS tablet Take 1,000 Units by mouth daily.      . fludrocortisone (FLORINEF) 0.1 MG tablet Take 0.1 mg by mouth 2 (two) times daily.       . metoprolol tartrate (LOPRESSOR) 25 MG tablet Take metoprolol 25 mg in the am and 12.68m in the pm. Pt. Stated understanding of instructions  30 tablet  3  . mirtazapine (REMERON) 15 MG tablet Take 15 mg by mouth at bedtime.      . Multiple Vitamin (MULTIVITAMIN) tablet Take 1 tablet by mouth daily.      . simvastatin (ZOCOR) 20 MG tablet Take 10 mg by mouth every evening.        No current facility-administered medications for this visit.    Past Medical History  Diagnosis Date  . Syncope   . COPD (chronic obstructive pulmonary disease)   . DM (dermatomyositis)   . Orthostatic hypotension   . Systemic hypertension   . Thoracic kyphosis   . Scoliosis     Past Surgical History  Procedure Laterality Date  . Permanent pacemaker generator change  03/27/2009    St.Jude  . Nm myocar perf wall motion  09/13/2011    Low risk  . [place]oreaechocardiography  03/28/2012    Mod  LAE,mild MR,aortic sclerosis w/mod AI,mod. TR,mild PI,Stage I diastolic dysfunction    No family history on file.  History   Social History  . Marital Status: Married    Spouse Name: N/A    Number of Children: N/A  . Years of Education: N/A   Occupational History  . Not on file.   Social History Main Topics  . Smoking status: Never Smoker   . Smokeless tobacco: Not on file  . Alcohol Use: No  . Drug Use: No  . Sexual Activity: Not on file   Other Topics Concern  . Not on file   Social History Narrative  . No narrative on file    Review of systems: The patient specifically  denies any chest pain at rest or with exertion, dyspnea at rest or with exertion, orthopnea, paroxysmal nocturnal dyspnea, syncope, palpitations, focal neurological deficits, intermittent claudication, lower extremity edema, unexplained weight gain, cough, hemoptysis or wheezing.  The patient also denies abdominal pain, nausea, vomiting, dysphagia, diarrhea, constipation, polyuria, polydipsia, dysuria, hematuria, frequency, urgency, abnormal bleeding or bruising, fever, chills, unexpected weight changes, mood swings, change in skin or hair texture, change in voice quality, auditory or visual problems, allergic reactions or rashes, new musculoskeletal complaints other than usual "aches and pains".   PHYSICAL EXAM BP 114/65  Pulse 70  Resp 20  Ht 5' 5.5" (1.664 m)  Wt 52.935 kg (116 lb 11.2 oz)  BMI 19.12 kg/m2  General: Alert, oriented x3, no distress Head: no evidence of trauma, PERRL, EOMI, no exophtalmos or lid lag, no myxedema, no xanthelasma; normal ears, nose and oropharynx Neck: normal jugular venous pulsations and no hepatojugular reflux; brisk carotid pulses without delay and no carotid bruits Chest: clear to auscultation, no signs of consolidation by percussion or palpation, normal fremitus, symmetrical and full respiratory excursions;, healthy subclavian pacemaker site Cardiovascular: normal position and quality of the apical impulse, regular rhythm, normal first and paradoxically split second heart sounds, no rubs or gallops, grade 1-2/6 early peaking systolic ejection murmur in the aortic focus, grade 5-1/7 holodiastolic murmur at the right lower sternal border Abdomen: no tenderness or distention, no masses by palpation, no abnormal pulsatility or arterial bruits, normal bowel sounds, no hepatosplenomegaly Extremities: no clubbing, cyanosis or edema; 2+ radial, ulnar and brachial pulses bilaterally; 2+ right femoral, posterior tibial and dorsalis pedis pulses; 2+ left femoral,  posterior tibial and dorsalis pedis pulses; no subclavian or femoral bruits Neurological: grossly nonfocal   EKG: Atrial ventricular sequential pacing  Lipid Panel  February 2014 total cholesterol 142, triglycerides 56, HDL 66, LDL 65, normal thyroid function studies in May, normal random serum cortisols at 7.8 (not sure what time of day this is drawn)  BMET    Component Value Date/Time   NA 143 11/01/2012 1640   K 4.2 11/01/2012 1640   CL 104 11/01/2012 1640   CO2 28 11/01/2012 1640   GLUCOSE 95 11/01/2012 1640   BUN 14 11/01/2012 1640   CREATININE 0.67 11/01/2012 1640   CREATININE 0.55 01/19/2011 0640   CALCIUM 9.3 11/01/2012 1640   GFRNONAA >60 01/19/2011 0640   GFRAA >60 01/19/2011 0640     ASSESSMENT AND PLAN Pacemaker Normal pacemaker lead and battery parameters. Unfortunately her device is not a radiofrequency 1 but she is still amenable to performing manual remote downloads. We discussed the fact that since she has complete heart block it is preferable that checked he performed every 3 months. Her lower pacing rate is set at 70 beats per  minute. No permanent changes were made to device settings. Her heart rate histogram is a little blunted but she is not particularly active.  Orthostatic hypotension In some control appears to be fair on the relatively low dose of fludrocortisone. Her blood pressure today is in the optimal range. I question whether she needs to continue taking metoprolol. I wonder if this was prescribed primarily for arrhythmia. As this is the first time I have met her, I decided not to make any changes.  CHB (complete heart block)    Aortic insufficiency Her echocardiograms have shown gradual worsening of this valvular problem and I think it would be useful to review her aortic insufficiency again. This is especially important since the most recent assessment of left ventricle systolic function by nuclear stress testing suggested mildly depressed EF of 47%. The  decrease in EF could also be due to RV apical pacing related synchrony. She'll be scheduled for an echocardiogram.   Patient Instructions  Dr. Sallyanne Kuster has requested that you have an echocardiogram. Echocardiography is a painless test that uses sound waves to create images of your heart. It provides your doctor with information about the size and shape of your heart and how well your heart's chambers and valves are working. This procedure takes approximately one hour. There are no restrictions for this procedure.  Remote monitoring is used to monitor your Pacemaker of ICD from home. This monitoring reduces the number of office visits required to check your device to one time per year. It allows [place] to keep an eye on the functioning of your device to ensure it is working properly. You are scheduled for a device check from home on October 12, 2013. You may send your transmission at any time that day. If you have a wireless device, the transmission will be sent automatically. After your physician reviews your transmission, you will receive a postcard with your next transmission date.  Dr. Sallyanne Kuster recommends that you schedule a follow-up appointment in: 6 months for an office visit and pacemaker interrogation.      Orders Placed This Encounter  Procedures  . 2D Echocardiogram without contrast   No orders of the defined types were placed in this encounter.    Holli Humbles, MD, [place] [phone] office [phone] pager

## 2013-07-13 NOTE — Assessment & Plan Note (Signed)
Normal pacemaker lead and battery parameters. Unfortunately her device is not a radiofrequency 1 but she is still amenable to performing manual remote downloads. We discussed the fact that since she has complete heart block it is preferable that checked he performed every 3 months. Her lower pacing rate is set at 70 beats per minute. No permanent changes were made to device settings. Her heart rate histogram is a little blunted but she is not particularly active.

## 2013-07-13 NOTE — Assessment & Plan Note (Signed)
In some control appears to be fair on the relatively low dose of fludrocortisone. Her blood pressure today is in the optimal range. I question whether she needs to continue taking metoprolol. I wonder if this was prescribed primarily for arrhythmia. As this is the first time I have met her, I decided not to make any changes.

## 2013-07-19 LAB — MDC_IDC_ENUM_SESS_TYPE_INCLINIC
Battery Voltage: 2.95 V
Brady Statistic RA Percent Paced: 97 %
Implantable Pulse Generator Serial Number: 2314326
Lead Channel Pacing Threshold Amplitude: 0.75 V
Lead Channel Pacing Threshold Pulse Width: 0.4 ms
Lead Channel Pacing Threshold Pulse Width: 0.4 ms
Lead Channel Sensing Intrinsic Amplitude: 12 mV
Lead Channel Setting Pacing Amplitude: 1 V
Lead Channel Setting Pacing Amplitude: 2.25 V
Lead Channel Setting Pacing Pulse Width: 0.4 ms
Lead Channel Setting Sensing Sensitivity: 4 mV
MDC IDC MSMT LEADCHNL RA IMPEDANCE VALUE: 430 Ohm
MDC IDC MSMT LEADCHNL RA PACING THRESHOLD AMPLITUDE: 0.75 V
MDC IDC MSMT LEADCHNL RA SENSING INTR AMPL: 2.2 mV
MDC IDC MSMT LEADCHNL RV IMPEDANCE VALUE: 460 Ohm
MDC IDC STAT BRADY RV PERCENT PACED: 99 %

## 2013-07-31 ENCOUNTER — Ambulatory Visit (HOSPITAL_COMMUNITY)
Admission: RE | Admit: 2013-07-31 | Discharge: 2013-07-31 | Disposition: A | Discharge status: Home / Self Care | Payer: Medicare Other | Source: Ambulatory Visit | Attending: Cardiovascular Disease | Admitting: Cardiovascular Disease

## 2013-07-31 DIAGNOSIS — R0602 Shortness of breath: Secondary | ICD-10-CM

## 2013-07-31 DIAGNOSIS — I359 Nonrheumatic aortic valve disorder, unspecified: Secondary | ICD-10-CM | POA: Diagnosis not present

## 2013-07-31 DIAGNOSIS — I379 Nonrheumatic pulmonary valve disorder, unspecified: Secondary | ICD-10-CM | POA: Diagnosis not present

## 2013-07-31 DIAGNOSIS — I079 Rheumatic tricuspid valve disease, unspecified: Secondary | ICD-10-CM | POA: Insufficient documentation

## 2013-07-31 DIAGNOSIS — H6120 Impacted cerumen, unspecified ear: Secondary | ICD-10-CM | POA: Diagnosis not present

## 2013-07-31 DIAGNOSIS — R0989 Other specified symptoms and signs involving the circulatory and respiratory systems: Secondary | ICD-10-CM | POA: Diagnosis not present

## 2013-07-31 DIAGNOSIS — R0609 Other forms of dyspnea: Secondary | ICD-10-CM | POA: Insufficient documentation

## 2013-07-31 DIAGNOSIS — I351 Nonrheumatic aortic (valve) insufficiency: Secondary | ICD-10-CM

## 2013-07-31 NOTE — Progress Notes (Signed)
2D Echo Performed 07/31/2013    Chestine Belknap, RCS  

## 2013-08-12 DIAGNOSIS — Z79899 Other long term (current) drug therapy: Secondary | ICD-10-CM | POA: Diagnosis not present

## 2013-08-12 DIAGNOSIS — I951 Orthostatic hypotension: Secondary | ICD-10-CM | POA: Diagnosis not present

## 2013-08-12 DIAGNOSIS — E7800 Pure hypercholesterolemia, unspecified: Secondary | ICD-10-CM | POA: Diagnosis not present

## 2013-08-12 DIAGNOSIS — R634 Abnormal weight loss: Secondary | ICD-10-CM | POA: Diagnosis not present

## 2013-10-14 ENCOUNTER — Ambulatory Visit (INDEPENDENT_AMBULATORY_CARE_PROVIDER_SITE_OTHER): Payer: Medicare Other | Admitting: *Deleted

## 2013-10-14 DIAGNOSIS — I442 Atrioventricular block, complete: Secondary | ICD-10-CM

## 2013-10-15 ENCOUNTER — Encounter: Payer: Self-pay | Admitting: Cardiovascular Disease

## 2013-10-15 LAB — MDC_IDC_ENUM_SESS_TYPE_REMOTE
Battery Voltage: 2.95 V
Brady Statistic AP VP Percent: 97 %
Brady Statistic AS VP Percent: 2.8 %
Brady Statistic RA Percent Paced: 97 %
Date Time Interrogation Session: 20150428232105
Lead Channel Pacing Threshold Amplitude: 0.75 V
Lead Channel Sensing Intrinsic Amplitude: 12 mV
Lead Channel Sensing Intrinsic Amplitude: 2 mV
Lead Channel Setting Pacing Amplitude: 1 V
Lead Channel Setting Sensing Sensitivity: 4 mV
MDC IDC MSMT BATTERY REMAINING LONGEVITY: 84 mo
MDC IDC MSMT LEADCHNL RA IMPEDANCE VALUE: 430 Ohm
MDC IDC MSMT LEADCHNL RV IMPEDANCE VALUE: 450 Ohm
MDC IDC MSMT LEADCHNL RV PACING THRESHOLD PULSEWIDTH: 0.4 ms
MDC IDC PG SERIAL: 2314326
MDC IDC SET LEADCHNL RA PACING AMPLITUDE: 2.25 V
MDC IDC SET LEADCHNL RV PACING PULSEWIDTH: 0.4 ms
MDC IDC STAT BRADY AP VS PERCENT: 1 %
MDC IDC STAT BRADY AS VS PERCENT: 1 %
MDC IDC STAT BRADY RV PERCENT PACED: 99 %

## 2013-10-24 ENCOUNTER — Telehealth: Payer: Self-pay | Admitting: Cardiovascular Disease

## 2013-10-24 ENCOUNTER — Encounter: Payer: Self-pay | Admitting: *Deleted

## 2013-10-24 NOTE — Telephone Encounter (Signed)
Patient informed that RDC was received. 

## 2013-10-24 NOTE — Telephone Encounter (Signed)
Calling about a Remote check .[name] Please Call    Thanks

## 2013-11-14 DIAGNOSIS — E7800 Pure hypercholesterolemia, unspecified: Secondary | ICD-10-CM | POA: Diagnosis not present

## 2013-11-14 DIAGNOSIS — L989 Disorder of the skin and subcutaneous tissue, unspecified: Secondary | ICD-10-CM | POA: Diagnosis not present

## 2013-11-14 DIAGNOSIS — I10 Essential (primary) hypertension: Secondary | ICD-10-CM | POA: Diagnosis not present

## 2013-11-14 DIAGNOSIS — R634 Abnormal weight loss: Secondary | ICD-10-CM | POA: Diagnosis not present

## 2013-11-14 DIAGNOSIS — F329 Major depressive disorder, single episode, unspecified: Secondary | ICD-10-CM | POA: Diagnosis not present

## 2013-11-14 DIAGNOSIS — I4891 Unspecified atrial fibrillation: Secondary | ICD-10-CM | POA: Diagnosis not present

## 2013-11-14 DIAGNOSIS — F3289 Other specified depressive episodes: Secondary | ICD-10-CM | POA: Diagnosis not present

## 2013-11-20 DIAGNOSIS — C44721 Squamous cell carcinoma of skin of unspecified lower limb, including hip: Secondary | ICD-10-CM | POA: Diagnosis not present

## 2013-11-20 DIAGNOSIS — L570 Actinic keratosis: Secondary | ICD-10-CM | POA: Diagnosis not present

## 2013-11-20 DIAGNOSIS — Z85820 Personal history of malignant melanoma of skin: Secondary | ICD-10-CM | POA: Diagnosis not present

## 2013-11-20 DIAGNOSIS — D045 Carcinoma in situ of skin of trunk: Secondary | ICD-10-CM | POA: Diagnosis not present

## 2013-12-05 DIAGNOSIS — Z85828 Personal history of other malignant neoplasm of skin: Secondary | ICD-10-CM | POA: Diagnosis not present

## 2013-12-05 DIAGNOSIS — L0100 Impetigo, unspecified: Secondary | ICD-10-CM | POA: Diagnosis not present

## 2013-12-05 DIAGNOSIS — A499 Bacterial infection, unspecified: Secondary | ICD-10-CM | POA: Diagnosis not present

## 2013-12-05 DIAGNOSIS — B9689 Other specified bacterial agents as the cause of diseases classified elsewhere: Secondary | ICD-10-CM | POA: Diagnosis not present

## 2013-12-05 DIAGNOSIS — Z85820 Personal history of malignant melanoma of skin: Secondary | ICD-10-CM | POA: Diagnosis not present

## 2014-01-03 DIAGNOSIS — Z85828 Personal history of other malignant neoplasm of skin: Secondary | ICD-10-CM | POA: Diagnosis not present

## 2014-01-03 DIAGNOSIS — L821 Other seborrheic keratosis: Secondary | ICD-10-CM | POA: Diagnosis not present

## 2014-02-04 ENCOUNTER — Encounter: Payer: Self-pay | Admitting: Cardiovascular Disease

## 2014-02-04 ENCOUNTER — Ambulatory Visit (INDEPENDENT_AMBULATORY_CARE_PROVIDER_SITE_OTHER): Payer: Medicare Other | Admitting: Cardiovascular Disease

## 2014-02-04 VITALS — BP 110/70 | HR 70 | Resp 16 | Ht 65.0 in | Wt 118.0 lb

## 2014-02-04 DIAGNOSIS — Z950 Presence of cardiac pacemaker: Secondary | ICD-10-CM

## 2014-02-04 DIAGNOSIS — I442 Atrioventricular block, complete: Secondary | ICD-10-CM

## 2014-02-04 DIAGNOSIS — I359 Nonrheumatic aortic valve disorder, unspecified: Secondary | ICD-10-CM | POA: Diagnosis not present

## 2014-02-04 DIAGNOSIS — I351 Nonrheumatic aortic (valve) insufficiency: Secondary | ICD-10-CM

## 2014-02-04 DIAGNOSIS — I495 Sick sinus syndrome: Secondary | ICD-10-CM

## 2014-02-04 NOTE — Patient Instructions (Signed)
Remote monitoring is used to monitor your pacemaker from home. This monitoring reduces the number of office visits required to check your device to one time per year. It allows [place] to keep an eye on the functioning of your device to ensure it is working properly. You are scheduled for a device check from home on 05-08-2014. You may send your transmission at any time that day. If you have a wireless device, the transmission will be sent automatically. After your physician reviews your transmission, you will receive a postcard with your next transmission date.  Your physician recommends that you schedule a follow-up appointment in: 12 months with Dr.Croitoru

## 2014-02-05 LAB — MDC_IDC_ENUM_SESS_TYPE_INCLINIC
Brady Statistic RA Percent Paced: 98 %
Implantable Pulse Generator Model: 2110
Implantable Pulse Generator Serial Number: 2314326
Lead Channel Impedance Value: 425 Ohm
Lead Channel Pacing Threshold Amplitude: 0.75 V
Lead Channel Pacing Threshold Amplitude: 0.75 V
Lead Channel Pacing Threshold Amplitude: 0.75 V
Lead Channel Pacing Threshold Pulse Width: 0.4 ms
Lead Channel Pacing Threshold Pulse Width: 0.4 ms
Lead Channel Sensing Intrinsic Amplitude: 2.6 mV
Lead Channel Setting Pacing Amplitude: 1 V
Lead Channel Setting Pacing Amplitude: 2.25 V
Lead Channel Setting Pacing Pulse Width: 0.4 ms
MDC IDC MSMT BATTERY REMAINING LONGEVITY: 75.6 mo
MDC IDC MSMT BATTERY VOLTAGE: 2.92 V
MDC IDC MSMT LEADCHNL RA IMPEDANCE VALUE: 387.5 Ohm
MDC IDC MSMT LEADCHNL RA PACING THRESHOLD PULSEWIDTH: 0.4 ms
MDC IDC MSMT LEADCHNL RV SENSING INTR AMPL: 11.3 mV
MDC IDC SESS DTM: 20150818155739
MDC IDC SET LEADCHNL RV SENSING SENSITIVITY: 4 mV
MDC IDC STAT BRADY RV PERCENT PACED: 99.98 %

## 2014-02-06 NOTE — Progress Notes (Signed)
Patient ID: [name], female   DOB: [birth year], 80 y.o.   MRN: [id number]     Reason for office visit COPD, aortic insufficiency, pacemaker followup  She has a history of orthostatic hypotension, sinus node dysfunction and complete heart block, status post dual-chamber pacemaker (St. Jude, 2010), diabetes mellitus controlled with diet and hyperlipidemia. She has had varied estimation of the severity of her aortic insufficiency by echocardiography but has a normal size left ventricle with normal left ventricular function and no other significant valvular abnormalities. Most recent echocardiogram showed only mild aortic insufficiency. Her original pacemaker and the current leads were implanted in 1992. She has had 2 generator change out, most recently in 2010. Her device is a Buyer, retail 2110 non RF dual-chamber pacemaker with a battery longevity estimated at about 7 years. The atrial lead is a St. Jude 4268 and the ventricular lead was a Biotronik [id number].  She is still grieving roughly one year after her husband of 50 years passed away. She has no cardiac complaints.  Pacemaker check shows normal device function. She has had a few very brief bursts of atrial tachycardia but no atrial fibrillation. She has 100% ventricular pacing and 98% atrial pacing.   No Known Allergies  Current Outpatient Prescriptions  Medication Sig Dispense Refill  . ALPRAZolam (XANAX) 0.25 MG tablet Take 0.25 mg by mouth at bedtime as needed for sleep.      [name] aspirin 81 MG tablet Take 81 mg by mouth daily.      . cholecalciferol (VITAMIN D) 1000 UNITS tablet Take 1,000 Units by mouth daily.      . fludrocortisone (FLORINEF) 0.1 MG tablet Take 0.1 mg by mouth 2 (two) times daily.       . metoprolol tartrate (LOPRESSOR) 25 MG tablet Take metoprolol 25 mg in the am and 12.5mg  in the pm. Pt. Stated understanding of instructions  30 tablet  3  . mirtazapine (REMERON) 15 MG tablet Take 15 mg by mouth at bedtime.        . Multiple Vitamin (MULTIVITAMIN) tablet Take 1 tablet by mouth daily.       No current facility-administered medications for this visit.    Past Medical History  Diagnosis Date  . Syncope   . COPD (chronic obstructive pulmonary disease)   . DM (dermatomyositis)   . Orthostatic hypotension   . Systemic hypertension   . Thoracic kyphosis   . Scoliosis   . Pacemaker 07/13/2013    Her original pacemaker and the current leads were implanted in 1992. She has had 2 generator change out, most recently in 2010. Her device is a Buyer, retail 2110 non RF dual-chamber pacemaker with a battery longevity estimated at about 7 years. The atrial lead is a St. Jude 3419 and the ventricular lead was a Biotronik [id number].   [name] CHB (complete heart block) 07/13/2013    Pacemaker dependent  . SSS (sick sinus syndrome) 07/13/2013    Past Surgical History  Procedure Laterality Date  . Permanent pacemaker generator change  03/27/2009    St.Jude  . Nm myocar perf wall motion  09/13/2011    Low risk  . US echocardiography  03/28/2012    Mod LAE,mild MR,aortic sclerosis w/mod AI,mod. TR,mild PI,Stage I diastolic dysfunction    No family history on file.  History   Social History  . Marital Status: Married    Spouse Name: N/A    Number of Children: N/A  . Years of Education: N/A  Occupational History  . Not on file.   Social History Main Topics  . Smoking status: Never Smoker   . Smokeless tobacco: Not on file  . Alcohol Use: No  . Drug Use: No  . Sexual Activity: Not on file   Other Topics Concern  . Not on file   Social History Narrative  . No narrative on file    Review of systems: The patient specifically denies any chest pain at rest or with exertion, dyspnea at rest or with exertion, orthopnea, paroxysmal nocturnal dyspnea, syncope, palpitations, focal neurological deficits, intermittent claudication, lower extremity edema, unexplained weight gain, cough, hemoptysis or  wheezing.  The patient also denies abdominal pain, nausea, vomiting, dysphagia, diarrhea, constipation, polyuria, polydipsia, dysuria, hematuria, frequency, urgency, abnormal bleeding or bruising, fever, chills, unexpected weight changes, mood swings, change in skin or hair texture, change in voice quality, auditory or visual problems, allergic reactions or rashes, new musculoskeletal complaints other than usual "aches and pains".   PHYSICAL EXAM BP 110/70  Pulse 70  Resp 16  Ht 5\' 5"  (1.651 m)  Wt 118 lb (53.524 kg)  BMI 19.64 kg/m2 General: Alert, oriented x3, no distress  Head: no evidence of trauma, PERRL, EOMI, no exophtalmos or lid lag, no myxedema, no xanthelasma; normal ears, nose and oropharynx  Neck: normal jugular venous pulsations and no hepatojugular reflux; brisk carotid pulses without delay and no carotid bruits  Chest: clear to auscultation, no signs of consolidation by percussion or palpation, normal fremitus, symmetrical and full respiratory excursions;, healthy subclavian pacemaker site  Cardiovascular: normal position and quality of the apical impulse, regular rhythm, normal first and paradoxically split second heart sounds, no rubs or gallops, grade 1-2/6 early peaking systolic ejection murmur in the aortic focus, grade 3-7/1 holodiastolic murmur at the right lower sternal border  Abdomen: no tenderness or distention, no masses by palpation, no abnormal pulsatility or arterial bruits, normal bowel sounds, no hepatosplenomegaly  Extremities: no clubbing, cyanosis or edema; 2+ radial, ulnar and brachial pulses bilaterally; 2+ right femoral, posterior tibial and dorsalis pedis pulses; 2+ left femoral, posterior tibial and dorsalis pedis pulses; no subclavian or femoral bruits  Neurological: grossly nonfocal      EKG: AV sequential pacing  Lipid Panel  February 2014 total cholesterol 142, triglycerides 56, HDL 66, LDL 65  BMET    Component Value Date/Time   NA 143  11/01/2012 1640   K 4.2 11/01/2012 1640   CL 104 11/01/2012 1640   CO2 28 11/01/2012 1640   GLUCOSE 95 11/01/2012 1640   BUN 14 11/01/2012 1640   CREATININE 0.67 11/01/2012 1640   CREATININE 0.55 01/19/2011 0640   CALCIUM 9.3 11/01/2012 1640   GFRNONAA >60 01/19/2011 0640   GFRAA >60 01/19/2011 0640     ASSESSMENT AND PLAN Complete heart block status post dual-chamber Pacemaker  Normal pacemaker lead and battery parameters.She is performing manual remote downloads. We discussed the fact that since she has complete heart block it is preferable that checked he performed every 3 months. Her lower pacing rate is set at 70 beats per minute. No permanent changes were made to device settings.   Orthostatic hypotension  Symptom control appears to be fair on the relatively low dose of fludrocortisone. Her blood pressure today is in the optimal range.  Aortic insufficiency  Her last echo shows only mild aortic insufficiency and normal left ventricular systolic function. Repeat evaluation would be indicated if she develops symptoms.   Orders Placed This Encounter  Procedures  .  Implantable device check  . EKG 12-Lead   No orders of the defined types were placed in this encounter.    Holli Humbles, MD, [place] [phone] office [phone] pager

## 2014-02-26 DIAGNOSIS — C44721 Squamous cell carcinoma of skin of unspecified lower limb, including hip: Secondary | ICD-10-CM | POA: Diagnosis not present

## 2014-02-28 DIAGNOSIS — Z79899 Other long term (current) drug therapy: Secondary | ICD-10-CM | POA: Diagnosis not present

## 2014-02-28 DIAGNOSIS — Z23 Encounter for immunization: Secondary | ICD-10-CM | POA: Diagnosis not present

## 2014-02-28 DIAGNOSIS — F329 Major depressive disorder, single episode, unspecified: Secondary | ICD-10-CM | POA: Diagnosis not present

## 2014-02-28 DIAGNOSIS — F3289 Other specified depressive episodes: Secondary | ICD-10-CM | POA: Diagnosis not present

## 2014-02-28 DIAGNOSIS — I10 Essential (primary) hypertension: Secondary | ICD-10-CM | POA: Diagnosis not present

## 2014-02-28 DIAGNOSIS — E7800 Pure hypercholesterolemia, unspecified: Secondary | ICD-10-CM | POA: Diagnosis not present

## 2014-02-28 DIAGNOSIS — Z0000 Encounter for general adult medical examination without abnormal findings: Secondary | ICD-10-CM | POA: Diagnosis not present

## 2014-03-03 ENCOUNTER — Encounter: Payer: Self-pay | Admitting: Cardiovascular Disease

## 2014-03-19 DIAGNOSIS — J209 Acute bronchitis, unspecified: Secondary | ICD-10-CM | POA: Diagnosis not present

## 2014-03-26 DIAGNOSIS — D225 Melanocytic nevi of trunk: Secondary | ICD-10-CM | POA: Diagnosis not present

## 2014-03-26 DIAGNOSIS — Z85828 Personal history of other malignant neoplasm of skin: Secondary | ICD-10-CM | POA: Diagnosis not present

## 2014-03-26 DIAGNOSIS — Z08 Encounter for follow-up examination after completed treatment for malignant neoplasm: Secondary | ICD-10-CM | POA: Diagnosis not present

## 2014-03-26 DIAGNOSIS — Z85820 Personal history of malignant melanoma of skin: Secondary | ICD-10-CM | POA: Diagnosis not present

## 2014-03-26 DIAGNOSIS — C44519 Basal cell carcinoma of skin of other part of trunk: Secondary | ICD-10-CM | POA: Diagnosis not present

## 2014-04-04 ENCOUNTER — Other Ambulatory Visit: Payer: Self-pay | Admitting: Geriatric Medicine

## 2014-04-04 DIAGNOSIS — E441 Mild protein-calorie malnutrition: Secondary | ICD-10-CM | POA: Diagnosis not present

## 2014-04-04 DIAGNOSIS — I340 Nonrheumatic mitral (valve) insufficiency: Secondary | ICD-10-CM | POA: Diagnosis not present

## 2014-04-04 DIAGNOSIS — I951 Orthostatic hypotension: Secondary | ICD-10-CM | POA: Diagnosis not present

## 2014-04-04 DIAGNOSIS — I10 Essential (primary) hypertension: Secondary | ICD-10-CM | POA: Diagnosis not present

## 2014-04-04 DIAGNOSIS — I480 Paroxysmal atrial fibrillation: Secondary | ICD-10-CM | POA: Diagnosis not present

## 2014-04-04 DIAGNOSIS — I714 Abdominal aortic aneurysm, without rupture, unspecified: Secondary | ICD-10-CM

## 2014-04-04 DIAGNOSIS — J309 Allergic rhinitis, unspecified: Secondary | ICD-10-CM | POA: Diagnosis not present

## 2014-04-10 ENCOUNTER — Ambulatory Visit
Admission: RE | Admit: 2014-04-10 | Discharge: 2014-04-10 | Disposition: A | Discharge status: Home / Self Care | Payer: Medicare Other | Source: Ambulatory Visit | Attending: Geriatric Medicine | Admitting: Geriatric Medicine

## 2014-04-10 DIAGNOSIS — Z136 Encounter for screening for cardiovascular disorders: Secondary | ICD-10-CM | POA: Diagnosis not present

## 2014-04-10 DIAGNOSIS — I714 Abdominal aortic aneurysm, without rupture, unspecified: Secondary | ICD-10-CM

## 2014-04-16 DIAGNOSIS — J329 Chronic sinusitis, unspecified: Secondary | ICD-10-CM | POA: Diagnosis not present

## 2014-04-16 DIAGNOSIS — J40 Bronchitis, not specified as acute or chronic: Secondary | ICD-10-CM | POA: Diagnosis not present

## 2014-04-30 DIAGNOSIS — Z85820 Personal history of malignant melanoma of skin: Secondary | ICD-10-CM | POA: Diagnosis not present

## 2014-04-30 DIAGNOSIS — C44712 Basal cell carcinoma of skin of right lower limb, including hip: Secondary | ICD-10-CM | POA: Diagnosis not present

## 2014-04-30 DIAGNOSIS — Z85828 Personal history of other malignant neoplasm of skin: Secondary | ICD-10-CM | POA: Diagnosis not present

## 2014-04-30 DIAGNOSIS — Z08 Encounter for follow-up examination after completed treatment for malignant neoplasm: Secondary | ICD-10-CM | POA: Diagnosis not present

## 2014-05-08 ENCOUNTER — Ambulatory Visit (INDEPENDENT_AMBULATORY_CARE_PROVIDER_SITE_OTHER): Payer: Medicare Other | Admitting: *Deleted

## 2014-05-08 ENCOUNTER — Telehealth: Payer: Self-pay | Admitting: Cardiology

## 2014-05-08 ENCOUNTER — Encounter: Payer: Self-pay | Admitting: Cardiovascular Disease

## 2014-05-08 DIAGNOSIS — I495 Sick sinus syndrome: Secondary | ICD-10-CM

## 2014-05-08 DIAGNOSIS — I442 Atrioventricular block, complete: Secondary | ICD-10-CM | POA: Diagnosis not present

## 2014-05-08 LAB — MDC_IDC_ENUM_SESS_TYPE_REMOTE
Battery Remaining Longevity: 66 mo
Battery Remaining Percentage: 59 %
Battery Voltage: 2.92 V
Brady Statistic AP VP Percent: 99 %
Date Time Interrogation Session: 20151119214747
Implantable Pulse Generator Model: 2110
Implantable Pulse Generator Serial Number: 2314326
Lead Channel Impedance Value: 380 Ohm
Lead Channel Pacing Threshold Pulse Width: 0.4 ms
Lead Channel Sensing Intrinsic Amplitude: 12 mV
Lead Channel Sensing Intrinsic Amplitude: 2.4 mV
Lead Channel Setting Pacing Amplitude: 1.125
Lead Channel Setting Pacing Pulse Width: 0.4 ms
MDC IDC MSMT LEADCHNL RA PACING THRESHOLD AMPLITUDE: 0.75 V
MDC IDC MSMT LEADCHNL RA PACING THRESHOLD PULSEWIDTH: 0.4 ms
MDC IDC MSMT LEADCHNL RV IMPEDANCE VALUE: 450 Ohm
MDC IDC MSMT LEADCHNL RV PACING THRESHOLD AMPLITUDE: 0.875 V
MDC IDC SET LEADCHNL RA PACING AMPLITUDE: 2.25 V
MDC IDC SET LEADCHNL RV SENSING SENSITIVITY: 4 mV
MDC IDC STAT BRADY AP VS PERCENT: 1 %
MDC IDC STAT BRADY AS VP PERCENT: 1.4 %
MDC IDC STAT BRADY AS VS PERCENT: 1 %
MDC IDC STAT BRADY RA PERCENT PACED: 99 %
MDC IDC STAT BRADY RV PERCENT PACED: 99 %

## 2014-05-08 NOTE — Telephone Encounter (Signed)
Spoke with pt and reminded pt of remote transmission that is due today. Pt verbalized understanding.   

## 2014-05-09 NOTE — Progress Notes (Signed)
Remote pacemaker transmission.   

## 2014-05-13 ENCOUNTER — Encounter: Payer: Self-pay | Admitting: Cardiology

## 2014-06-02 DIAGNOSIS — R0902 Hypoxemia: Secondary | ICD-10-CM | POA: Diagnosis not present

## 2014-06-02 DIAGNOSIS — R05 Cough: Secondary | ICD-10-CM | POA: Diagnosis not present

## 2014-06-02 DIAGNOSIS — J449 Chronic obstructive pulmonary disease, unspecified: Secondary | ICD-10-CM | POA: Diagnosis not present

## 2014-06-02 DIAGNOSIS — R531 Weakness: Secondary | ICD-10-CM | POA: Diagnosis not present

## 2014-06-02 DIAGNOSIS — R410 Disorientation, unspecified: Secondary | ICD-10-CM | POA: Diagnosis not present

## 2014-06-05 ENCOUNTER — Other Ambulatory Visit: Payer: Self-pay | Admitting: Nurse Practitioner

## 2014-06-05 ENCOUNTER — Ambulatory Visit
Admission: RE | Admit: 2014-06-05 | Discharge: 2014-06-05 | Disposition: A | Discharge status: Home / Self Care | Payer: Medicare Other | Source: Ambulatory Visit | Attending: Nurse Practitioner | Admitting: Nurse Practitioner

## 2014-06-05 DIAGNOSIS — R059 Cough, unspecified: Secondary | ICD-10-CM

## 2014-06-05 DIAGNOSIS — R531 Weakness: Secondary | ICD-10-CM | POA: Diagnosis not present

## 2014-06-05 DIAGNOSIS — R05 Cough: Secondary | ICD-10-CM | POA: Diagnosis not present

## 2014-06-05 DIAGNOSIS — J984 Other disorders of lung: Secondary | ICD-10-CM | POA: Diagnosis not present

## 2014-06-10 DIAGNOSIS — R531 Weakness: Secondary | ICD-10-CM | POA: Diagnosis not present

## 2014-06-10 DIAGNOSIS — R05 Cough: Secondary | ICD-10-CM | POA: Diagnosis not present

## 2014-06-10 DIAGNOSIS — J159 Unspecified bacterial pneumonia: Secondary | ICD-10-CM | POA: Diagnosis not present

## 2014-06-18 ENCOUNTER — Ambulatory Visit
Admission: RE | Admit: 2014-06-18 | Discharge: 2014-06-18 | Disposition: A | Discharge status: Home / Self Care | Payer: Medicare Other | Source: Ambulatory Visit | Attending: Nurse Practitioner | Admitting: Nurse Practitioner

## 2014-06-18 ENCOUNTER — Other Ambulatory Visit: Payer: Self-pay | Admitting: Nurse Practitioner

## 2014-06-18 DIAGNOSIS — J189 Pneumonia, unspecified organism: Secondary | ICD-10-CM | POA: Diagnosis not present

## 2014-06-18 DIAGNOSIS — R059 Cough, unspecified: Secondary | ICD-10-CM

## 2014-06-18 DIAGNOSIS — R05 Cough: Secondary | ICD-10-CM

## 2014-07-04 DIAGNOSIS — R634 Abnormal weight loss: Secondary | ICD-10-CM | POA: Diagnosis not present

## 2014-07-04 DIAGNOSIS — I951 Orthostatic hypotension: Secondary | ICD-10-CM | POA: Diagnosis not present

## 2014-07-04 DIAGNOSIS — I700 Atherosclerosis of aorta: Secondary | ICD-10-CM | POA: Diagnosis not present

## 2014-07-04 DIAGNOSIS — I480 Paroxysmal atrial fibrillation: Secondary | ICD-10-CM | POA: Diagnosis not present

## 2014-07-17 ENCOUNTER — Ambulatory Visit
Admission: RE | Admit: 2014-07-17 | Discharge: 2014-07-17 | Disposition: A | Discharge status: Home / Self Care | Payer: Medicare Other | Source: Ambulatory Visit | Attending: Geriatric Medicine | Admitting: Geriatric Medicine

## 2014-07-17 ENCOUNTER — Other Ambulatory Visit: Payer: Self-pay | Admitting: Geriatric Medicine

## 2014-07-17 DIAGNOSIS — Z8701 Personal history of pneumonia (recurrent): Secondary | ICD-10-CM | POA: Diagnosis not present

## 2014-07-17 DIAGNOSIS — J40 Bronchitis, not specified as acute or chronic: Secondary | ICD-10-CM

## 2014-07-17 DIAGNOSIS — J449 Chronic obstructive pulmonary disease, unspecified: Secondary | ICD-10-CM | POA: Diagnosis not present

## 2014-08-11 ENCOUNTER — Encounter: Payer: Medicare Other | Admitting: *Deleted

## 2014-08-11 ENCOUNTER — Telehealth: Payer: Self-pay | Admitting: Cardiology

## 2014-08-11 NOTE — Telephone Encounter (Signed)
Spoke with pt and reminded pt of remote transmission that is due today. Pt verbalized understanding.   

## 2014-08-12 ENCOUNTER — Encounter: Payer: Self-pay | Admitting: Cardiology

## 2014-08-17 DIAGNOSIS — I495 Sick sinus syndrome: Secondary | ICD-10-CM

## 2014-08-17 LAB — MDC_IDC_ENUM_SESS_TYPE_REMOTE
Battery Remaining Longevity: 67 mo
Battery Voltage: 2.92 V
Brady Statistic AP VP Percent: 97 %
Brady Statistic AS VP Percent: 2.9 %
Brady Statistic AS VS Percent: 1 %
Lead Channel Impedance Value: 430 Ohm
Lead Channel Impedance Value: 450 Ohm
Lead Channel Pacing Threshold Amplitude: 0.75 V
Lead Channel Pacing Threshold Pulse Width: 0.4 ms
Lead Channel Sensing Intrinsic Amplitude: 12 mV
Lead Channel Setting Pacing Amplitude: 1 V
Lead Channel Setting Pacing Amplitude: 2.25 V
Lead Channel Setting Pacing Pulse Width: 0.4 ms
Lead Channel Setting Sensing Sensitivity: 4 mV
MDC IDC MSMT BATTERY REMAINING PERCENTAGE: 59 %
MDC IDC MSMT LEADCHNL RA PACING THRESHOLD PULSEWIDTH: 0.4 ms
MDC IDC MSMT LEADCHNL RA SENSING INTR AMPL: 3.7 mV
MDC IDC MSMT LEADCHNL RV PACING THRESHOLD AMPLITUDE: 0.75 V
MDC IDC PG SERIAL: 2314326
MDC IDC SESS DTM: 20160228231008
MDC IDC STAT BRADY AP VS PERCENT: 1 %
MDC IDC STAT BRADY RA PERCENT PACED: 96 %
MDC IDC STAT BRADY RV PERCENT PACED: 99 %

## 2014-08-18 ENCOUNTER — Ambulatory Visit (INDEPENDENT_AMBULATORY_CARE_PROVIDER_SITE_OTHER): Payer: Medicare Other | Admitting: *Deleted

## 2014-08-18 DIAGNOSIS — I495 Sick sinus syndrome: Secondary | ICD-10-CM

## 2014-08-18 NOTE — Progress Notes (Signed)
Remote pacemaker transmission.   

## 2014-08-26 ENCOUNTER — Telehealth: Payer: Self-pay | Admitting: *Deleted

## 2014-08-26 NOTE — Telephone Encounter (Signed)
LMTCB/SSS 

## 2014-08-27 NOTE — Telephone Encounter (Signed)
New message      Returning [name]'s call

## 2014-08-28 ENCOUNTER — Telehealth: Payer: Self-pay | Admitting: Cardiovascular Disease

## 2014-08-28 NOTE — Telephone Encounter (Signed)
F/u ° ° °Pt returning your call °

## 2014-08-28 NOTE — Telephone Encounter (Signed)
Appt made for 3/15 to discuss A/C per [facility]. Patient voiced understanding.

## 2014-08-29 NOTE — Telephone Encounter (Signed)
Closed encounter °

## 2014-09-02 ENCOUNTER — Ambulatory Visit (INDEPENDENT_AMBULATORY_CARE_PROVIDER_SITE_OTHER): Payer: Medicare Other | Admitting: Cardiovascular Disease

## 2014-09-02 ENCOUNTER — Encounter: Payer: Self-pay | Admitting: Cardiovascular Disease

## 2014-09-02 VITALS — BP 100/62 | HR 72 | Ht 65.0 in | Wt 117.4 lb

## 2014-09-02 DIAGNOSIS — I495 Sick sinus syndrome: Secondary | ICD-10-CM

## 2014-09-02 DIAGNOSIS — I951 Orthostatic hypotension: Secondary | ICD-10-CM | POA: Diagnosis not present

## 2014-09-02 DIAGNOSIS — I351 Nonrheumatic aortic (valve) insufficiency: Secondary | ICD-10-CM | POA: Diagnosis not present

## 2014-09-02 DIAGNOSIS — I480 Paroxysmal atrial fibrillation: Secondary | ICD-10-CM | POA: Diagnosis not present

## 2014-09-02 DIAGNOSIS — I442 Atrioventricular block, complete: Secondary | ICD-10-CM

## 2014-09-02 DIAGNOSIS — Z950 Presence of cardiac pacemaker: Secondary | ICD-10-CM | POA: Diagnosis not present

## 2014-09-02 MED ORDER — APIXABAN 5 MG PO TABS: 5.0000 mg | ORAL_TABLET | Freq: Two times a day (BID) | ORAL | Status: DC

## 2014-09-02 NOTE — Patient Instructions (Signed)
START Eliquis 5mg  twice a day.  STOP Aspirin.  Dr. Sallyanne Kuster recommends that you schedule a follow-up appointment in: 1st of June pacer day.

## 2014-09-04 ENCOUNTER — Encounter: Payer: Self-pay | Admitting: Cardiovascular Disease

## 2014-09-04 DIAGNOSIS — I480 Paroxysmal atrial fibrillation: Secondary | ICD-10-CM

## 2014-09-04 HISTORY — DX: Paroxysmal atrial fibrillation: I48.0

## 2014-09-04 NOTE — Progress Notes (Signed)
Patient ID: [name], female   DOB: 03/20/1931, 81 y.o.   MRN: [id number]     Cardiology Office Note   Date:  09/04/2014   ID:  [name], DOB 1930-06-26, MRN [id number]  PCP:  Mathews Argyle, MD  Cardiologist:   Sanda Klein, MD   Chief Complaint  Patient presents with  . Follow-up    No complaints of chest pain, SOB, edema or dizziness. Feels fatigued all the time.   Had a bad bout of pneumonia in December.      History of Present Illness: [name] is a 81 y.o. female who presents for pacemaker check (complete heart block) discussion of a prolonged episode of atrial fibrillation that occurred in December. She is accompanied by her daughter.  She had almost 2 days of continuous atrial fibrillation in December, that she had no awareness of. She was suffering from pneumonia at the time and was quite sick, on oxygen, but was not hospitalized. She still feels rather weakened since that illness. Her pacemaker has previously detected multiple episodes of atrial tachycardia, but not atrial fibrillation.  Otherwise device interrogation is normal.  She has a history of orthostatic hypotension, sinus node dysfunction and complete heart block, status post dual-chamber pacemaker (St. Jude, 2010), diabetes mellitus controlled with diet and hyperlipidemia. She has had varied estimation of the severity of her aortic insufficiency by echocardiography but has a normal size left ventricle with normal left ventricular function and no other significant valvular abnormalities. Most recent echocardiogram showed only mild aortic insufficiency. Her original pacemaker and the current leads were implanted in 1992. She has had 2 generator change out, most recently in 2010. Her device is a Buyer, retail 2110 non RF dual-chamber pacemaker with a battery longevity estimated at about 7 years. The atrial lead is a St. Jude [id number] and the ventricular lead was a Biotronik [id number].    Past Medical  History  Diagnosis Date  . Syncope   . COPD (chronic obstructive pulmonary disease)   . DM (dermatomyositis)   . Orthostatic hypotension   . Systemic hypertension   . Thoracic kyphosis   . Scoliosis   . Pacemaker 07/13/2013    Her original pacemaker and the current leads were implanted in 1992. She has had 2 generator change out, most recently in 2010. Her device is a Buyer, retail 2110 non RF dual-chamber pacemaker with a battery longevity estimated at about 7 years. The atrial lead is a St. Jude 1655 and the ventricular lead was a Biotronik [id number].   [name] CHB (complete heart block) 07/13/2013    Pacemaker dependent  . SSS (sick sinus syndrome) 07/13/2013  . Paroxysmal atrial fibrillation 09/04/2014    Past Surgical History  Procedure Laterality Date  . Permanent pacemaker generator change  03/27/2009    St.Jude  . Nm myocar perf wall motion  09/13/2011    Low risk  . US echocardiography  03/28/2012    Mod LAE,mild MR,aortic sclerosis w/mod AI,mod. TR,mild PI,Stage I diastolic dysfunction     Current Outpatient Prescriptions  Medication Sig Dispense Refill  . ALPRAZolam (XANAX) 0.25 MG tablet Take 0.25 mg by mouth at bedtime as needed for sleep.    . cholecalciferol (VITAMIN D) 1000 UNITS tablet Take 1,000 Units by mouth daily.    . fludrocortisone (FLORINEF) 0.1 MG tablet Take 0.1 mg by mouth 2 (two) times daily.     . metoprolol tartrate (LOPRESSOR) 25 MG tablet Take metoprolol 25 mg in the am  and 12.5mg  in the pm. Pt. Stated understanding of instructions 30 tablet 3  . mirtazapine (REMERON) 15 MG tablet Take 15 mg by mouth at bedtime.    . Multiple Vitamin (MULTIVITAMIN) tablet Take 1 tablet by mouth daily.    [name] apixaban (ELIQUIS) 5 MG TABS tablet Take 1 tablet (5 mg total) by mouth 2 (two) times daily. 60 tablet 6   No current facility-administered medications for this visit.    Allergies:   Review of patient's allergies indicates no known allergies.    Social History:  The  patient  reports that she has never smoked. She does not have any smokeless tobacco history on file. She reports that she does not drink alcohol or use illicit drugs.   Family History:  The patient's family history is reviewed and is not contributory  ROS:  Please see the history of present illness.   Otherwise, review of systems are positive for none.   All other systems are reviewed and negative.    PHYSICAL EXAM: VS:  BP 100/62 mmHg  Pulse 72  Ht 5\' 5"  (1.651 m)  Wt 117 lb 6.4 oz (53.252 kg)  BMI 19.54 kg/m2 , BMI Body mass index is 19.54 kg/(m^2). General: Alert, oriented x3, no distress  Head: no evidence of trauma, PERRL, EOMI, no exophtalmos or lid lag, no myxedema, no xanthelasma; normal ears, nose and oropharynx  Neck: normal jugular venous pulsations and no hepatojugular reflux; brisk carotid pulses without delay and no carotid bruits  Chest: clear to auscultation, no signs of consolidation by percussion or palpation, normal fremitus, symmetrical and full respiratory excursions;, healthy subclavian pacemaker site  Cardiovascular: normal position and quality of the apical impulse, regular rhythm, normal first and paradoxically split second heart sounds, no rubs or gallops, grade 1-2/6 early peaking systolic ejection murmur in the aortic focus, grade 4-6/5 holodiastolic murmur at the right lower sternal border  Abdomen: no tenderness or distention, no masses by palpation, no abnormal pulsatility or arterial bruits, normal bowel sounds, no hepatosplenomegaly  Extremities: no clubbing, cyanosis or edema; 2+ radial, ulnar and brachial pulses bilaterally; 2+ right femoral, posterior tibial and dorsalis pedis pulses; 2+ left femoral, posterior tibial and dorsalis pedis pulses; no subclavian or femoral bruits  Neurological: grossly nonfocal    EKG:  EKG is not ordered today.  Recent Labs: No results found for requested labs within last 365 days.    Lipid Panel No results found  for: CHOL, TRIG, HDL, CHOLHDL, VLDL, LDLCALC, LDLDIRECT    Wt Readings from Last 3 Encounters:  09/02/14 117 lb 6.4 oz (53.252 kg)  02/04/14 118 lb (53.524 kg)  07/10/13 116 lb 11.2 oz (52.935 kg)     ASSESSMENT AND PLAN:  1. Paroxysmal atrial fibrillation - lengthy episode. CHADSVasc is at least 3. Although the arrhythmia occurred during an episode of pneumonia, she has previously had frequent PAT and the likelihood of AF recurrence is high. Recommend anticoagulation. Will start Eliquis. Stop Aspirin. Discussed risk of embolic stroke versus bleeding complications. Creat 0.67.  2. Orthostatic hypotension  Symptom control appears to be fair on the relatively low dose of fludrocortisone. Her blood pressure today is in the optimal range.  3. Aortic insufficiency  Her last echo shows only mild aortic insufficiency and normal left ventricular systolic function. Repeat evaluation would be indicated if she develops symptoms.   Current medicines are reviewed at length with the patient today.  The patient does not have concerns regarding medicines.  The following changes have  been made:     Patient Instructions  START Eliquis 5mg  twice a day.  STOP Aspirin.  Dr. Sallyanne Kuster recommends that you schedule a follow-up appointment in: 1st of June pacer day.       Mikael Spray, MD  09/04/2014 10:00 PM    [address], [place], [place]  [address] Phone: [phone]; Fax: [phone]

## 2014-09-18 ENCOUNTER — Encounter: Payer: Self-pay | Admitting: Cardiovascular Disease

## 2014-10-07 DIAGNOSIS — Z1283 Encounter for screening for malignant neoplasm of skin: Secondary | ICD-10-CM | POA: Diagnosis not present

## 2014-10-07 DIAGNOSIS — X32XXXD Exposure to sunlight, subsequent encounter: Secondary | ICD-10-CM | POA: Diagnosis not present

## 2014-10-07 DIAGNOSIS — C44519 Basal cell carcinoma of skin of other part of trunk: Secondary | ICD-10-CM | POA: Diagnosis not present

## 2014-10-07 DIAGNOSIS — Z08 Encounter for follow-up examination after completed treatment for malignant neoplasm: Secondary | ICD-10-CM | POA: Diagnosis not present

## 2014-10-07 DIAGNOSIS — Z85820 Personal history of malignant melanoma of skin: Secondary | ICD-10-CM | POA: Diagnosis not present

## 2014-10-07 DIAGNOSIS — L570 Actinic keratosis: Secondary | ICD-10-CM | POA: Diagnosis not present

## 2014-10-07 DIAGNOSIS — L821 Other seborrheic keratosis: Secondary | ICD-10-CM | POA: Diagnosis not present

## 2014-10-13 DIAGNOSIS — H6121 Impacted cerumen, right ear: Secondary | ICD-10-CM | POA: Diagnosis not present

## 2014-10-16 ENCOUNTER — Encounter: Payer: Self-pay | Admitting: Cardiovascular Disease

## 2014-10-16 ENCOUNTER — Ambulatory Visit (INDEPENDENT_AMBULATORY_CARE_PROVIDER_SITE_OTHER): Payer: Medicare Other | Admitting: Cardiovascular Disease

## 2014-10-16 VITALS — BP 138/76 | HR 70 | Ht 65.0 in | Wt 118.0 lb

## 2014-10-16 DIAGNOSIS — I495 Sick sinus syndrome: Secondary | ICD-10-CM

## 2014-10-16 DIAGNOSIS — I951 Orthostatic hypotension: Secondary | ICD-10-CM

## 2014-10-16 DIAGNOSIS — I480 Paroxysmal atrial fibrillation: Secondary | ICD-10-CM | POA: Diagnosis not present

## 2014-10-16 DIAGNOSIS — I442 Atrioventricular block, complete: Secondary | ICD-10-CM

## 2014-10-16 DIAGNOSIS — Z950 Presence of cardiac pacemaker: Secondary | ICD-10-CM

## 2014-10-16 NOTE — Progress Notes (Addendum)
Patient ID: [name], female   DOB: Aug 25, 1930, 81 y.o.   MRN: [id number]     Cardiology Office Note   Date:  10/16/2014   ID:  [name], DOB 1931/04/02, MRN [id number]  PCP:  Mathews Argyle, MD  Cardiologist:   Sanda Klein, MD   chief complaint: All up after recent diagnosis of paroxysmal atrial fibrillation and initiation of anticoagulation     History of Present Illness: [name] is a 81 y.o. female who presents for follow-up after recent pacemaker recording of prolonged atrial fibrillation and initiation of anticoagulation therapy. She has not had any serious bleeding problems but has noticed easy bruising of her shins after hitting them against objects. Interrogation of her pacemaker shows multiple episodes of brief paroxysmal atrial tachycardia lasting up to 10 seconds but no new sustained atrial fibrillation since the episode in December. As before she has 93% atrial pacing and virtually 100% ventricular pacing. There are no detectable R waves. She has complete heart block and is pacemaker dependent. Battery longevity is around 6 years. No neurological problems are reported.  She has a history of orthostatic hypotension, sinus node dysfunction and complete heart block, status post dual-chamber pacemaker (St. Jude, 2010), diabetes mellitus controlled with diet and hyperlipidemia. She has had varied estimation of the severity of her aortic insufficiency by echocardiography but has a normal size left ventricle with normal left ventricular function and no other significant valvular abnormalities. Most recent echocardiogram showed only mild aortic insufficiency. Her original pacemaker and the current leads were implanted in 1992. She has had 2 generator change out, most recently in 2010. Her device is a Buyer, retail 2110 non RF dual-chamber pacemaker with a battery longevity estimated at about 7 years. The atrial lead is a St. Jude 9518 and the ventricular lead was a  Biotronik [id number].  Past Medical History  Diagnosis Date  . Syncope   . COPD (chronic obstructive pulmonary disease)   . DM (dermatomyositis)   . Orthostatic hypotension   . Systemic hypertension   . Thoracic kyphosis   . Scoliosis   . Pacemaker 07/13/2013    Her original pacemaker and the current leads were implanted in 1992. She has had 2 generator change out, most recently in 2010. Her device is a Buyer, retail 2110 non RF dual-chamber pacemaker with a battery longevity estimated at about 7 years. The atrial lead is a St. Jude 8416 and the ventricular lead was a Biotronik [id number].   [name] CHB (complete heart block) 07/13/2013    Pacemaker dependent  . SSS (sick sinus syndrome) 07/13/2013  . Paroxysmal atrial fibrillation 09/04/2014    Past Surgical History  Procedure Laterality Date  . Permanent pacemaker generator change  03/27/2009    St.Jude  . Nm myocar perf wall motion  09/13/2011    Low risk  . US echocardiography  03/28/2012    Mod LAE,mild MR,aortic sclerosis w/mod AI,mod. TR,mild PI,Stage I diastolic dysfunction     Current Outpatient Prescriptions  Medication Sig Dispense Refill  . ALPRAZolam (XANAX) 0.25 MG tablet Take 0.25 mg by mouth at bedtime as needed for sleep.    [name] apixaban (ELIQUIS) 5 MG TABS tablet Take 1 tablet (5 mg total) by mouth 2 (two) times daily. 60 tablet 6  . cholecalciferol (VITAMIN D) 1000 UNITS tablet Take 1,000 Units by mouth daily.    . fludrocortisone (FLORINEF) 0.1 MG tablet Take 0.1 mg by mouth 2 (two) times daily.     [name]  metoprolol tartrate (LOPRESSOR) 25 MG tablet Take metoprolol 25 mg in the am and 12.5mg  in the pm. Pt. Stated understanding of instructions 30 tablet 3  . mirtazapine (REMERON) 15 MG tablet Take 7.5 mg by mouth at bedtime. Take 1/2 tablet at night.    . Multiple Vitamin (MULTIVITAMIN) tablet Take 1 tablet by mouth daily.     No current facility-administered medications for this visit.    Allergies:   Review of patient's  allergies indicates no known allergies.    Social History:  The patient  reports that she has never smoked. She does not have any smokeless tobacco history on file. She reports that she does not drink alcohol or use illicit drugs.   ROS:  Please see the history of present illness.    Otherwise, review of systems positive for none.   All other systems are reviewed and negative.    PHYSICAL EXAM: VS:  BP 138/76 mmHg  Pulse 70  Ht 5\' 5"  (1.651 m)  Wt 118 lb (53.524 kg)  BMI 19.64 kg/m2 , BMI Body mass index is 19.64 kg/(m^2).  General: Alert, oriented x3, no distress  Head: no evidence of trauma, PERRL, EOMI, no exophtalmos or lid lag, no myxedema, no xanthelasma; normal ears, nose and oropharynx  Neck: normal jugular venous pulsations and no hepatojugular reflux; brisk carotid pulses without delay and no carotid bruits  Chest: clear to auscultation, no signs of consolidation by percussion or palpation, normal fremitus, symmetrical and full respiratory excursions;, healthy subclavian pacemaker site  Cardiovascular: normal position and quality of the apical impulse, regular rhythm, normal first and paradoxically split second heart sounds, no rubs or gallops, grade 1-2/6 early peaking systolic ejection murmur in the aortic focus, grade 5-3/6 holodiastolic murmur at the right lower sternal border  Abdomen: no tenderness or distention, no masses by palpation, no abnormal pulsatility or arterial bruits, normal bowel sounds, no hepatosplenomegaly  Extremities: no clubbing, cyanosis or edema; 2+ radial, ulnar and brachial pulses bilaterally; 2+ right femoral, posterior tibial and dorsalis pedis pulses; 2+ left femoral, posterior tibial and dorsalis pedis pulses; no subclavian or femoral bruits  Neurological: grossly nonfocal   ECG ordered today shows atrial sensed ventricular paced rhythm (sinus mechanism) with dominant R waves in leads V1 and V2  Recent Labs: No results found for  requested labs within last 365 days.    Lipid Panel No results found for: CHOL, TRIG, HDL, CHOLHDL, VLDL, LDLCALC, LDLDIRECT    Wt Readings from Last 3 Encounters:  10/16/14 118 lb (53.524 kg)  09/02/14 117 lb 6.4 oz (53.252 kg)  02/04/14 118 lb (53.524 kg)     ASSESSMENT AND PLAN:  1. Paroxysmal atrial fibrillation - lengthy episode. CHADSVasc is at least 3. Although the arrhythmia occurred during an episode of pneumonia, she has previously had frequent PAT and the likelihood of AF recurrence is high. Recommend anticoagulation. Will start Eliquis. Discussed risk of embolic stroke versus bleeding complications. Creat 0.67.  2. Orthostatic hypotension  Symptom control appears to be fair on the relatively low dose of fludrocortisone. Her blood pressure today is in the optimal range.  3. Aortic insufficiency  Her last echo shows only mild aortic insufficiency and normal left ventricular systolic function. Repeat evaluation would be indicated if she develops symptoms.   Current medicines are reviewed at length with the patient today.  The patient does not have concerns regarding medicines.  The following changes have been made:  no change  Labs/ tests ordered today include:  No orders of the defined types were placed in this encounter.    Patient Instructions  Remote monitoring is used to monitor your Pacemaker from home. This monitoring reduces the number of office visits required to check your device to one time per year. It allows [place] to monitor the functioning of your device to ensure it is working properly. You are scheduled for a device check from home on January 16, 2015. You may send your transmission at any time that day. If you have a wireless device, the transmission will be sent automatically. After your physician reviews your transmission, you will receive a postcard with your next transmission date.  Dr. Sallyanne Kuster recommends that you schedule a follow-up appointment in: One  Year.       Mikael Spray, MD  10/16/2014 6:47 PM    Sanda Klein, MD, [facility] [phone] office [phone] pager

## 2014-10-16 NOTE — Patient Instructions (Signed)
Remote monitoring is used to monitor your Pacemaker from home. This monitoring reduces the number of office visits required to check your device to one time per year. It allows [place] to monitor the functioning of your device to ensure it is working properly. You are scheduled for a device check from home on January 16, 2015. You may send your transmission at any time that day. If you have a wireless device, the transmission will be sent automatically. After your physician reviews your transmission, you will receive a postcard with your next transmission date.  Dr. Sallyanne Kuster recommends that you schedule a follow-up appointment in: One Year.

## 2014-10-17 LAB — MDC_IDC_ENUM_SESS_TYPE_INCLINIC
Battery Remaining Percentage: 65 %
Brady Statistic RA Percent Paced: 93 %
Brady Statistic RV Percent Paced: 99 %
Implantable Pulse Generator Serial Number: 2314326
Lead Channel Impedance Value: 380 Ohm
Lead Channel Impedance Value: 450 Ohm
Lead Channel Pacing Threshold Amplitude: 0.75 V
Lead Channel Pacing Threshold Pulse Width: 0.4 ms
Lead Channel Pacing Threshold Pulse Width: 0.4 ms
Lead Channel Setting Pacing Amplitude: 1.125
Lead Channel Setting Pacing Amplitude: 2.25 V
Lead Channel Setting Pacing Pulse Width: 0.4 ms
MDC IDC MSMT BATTERY VOLTAGE: 2.9 V
MDC IDC MSMT LEADCHNL RA SENSING INTR AMPL: 2.3 mV
MDC IDC MSMT LEADCHNL RV PACING THRESHOLD AMPLITUDE: 0.875 V
MDC IDC SET LEADCHNL RV SENSING SENSITIVITY: 4 mV

## 2014-10-31 ENCOUNTER — Other Ambulatory Visit: Payer: Self-pay | Admitting: *Deleted

## 2014-10-31 MED ORDER — APIXABAN 5 MG PO TABS: 5.0000 mg | ORAL_TABLET | Freq: Two times a day (BID) | ORAL | Status: DC

## 2014-10-31 NOTE — Telephone Encounter (Signed)
Patient notified we have Eliquis samples available for pick up at the front desk.  Patient voiced understanding and will pick up next week.

## 2014-11-20 ENCOUNTER — Encounter: Payer: Self-pay | Admitting: Cardiovascular Disease

## 2014-11-25 ENCOUNTER — Encounter: Payer: BLUE CROSS/BLUE SHIELD | Admitting: Cardiovascular Disease

## 2014-11-28 ENCOUNTER — Telehealth: Payer: Self-pay | Admitting: *Deleted

## 2014-11-28 MED ORDER — APIXABAN 5 MG PO TABS: 5.0000 mg | ORAL_TABLET | Freq: Two times a day (BID) | ORAL | Status: DC

## 2014-11-28 NOTE — Telephone Encounter (Signed)
Patient notified I have Eliquis samples at the front desk for her to pick up.  Voiced appreciation and she will pick up next week.

## 2014-12-08 DIAGNOSIS — I10 Essential (primary) hypertension: Secondary | ICD-10-CM | POA: Diagnosis not present

## 2014-12-08 DIAGNOSIS — G4700 Insomnia, unspecified: Secondary | ICD-10-CM | POA: Diagnosis not present

## 2014-12-08 DIAGNOSIS — I700 Atherosclerosis of aorta: Secondary | ICD-10-CM | POA: Diagnosis not present

## 2014-12-08 DIAGNOSIS — R634 Abnormal weight loss: Secondary | ICD-10-CM | POA: Diagnosis not present

## 2014-12-08 DIAGNOSIS — M25562 Pain in left knee: Secondary | ICD-10-CM | POA: Diagnosis not present

## 2014-12-08 DIAGNOSIS — I480 Paroxysmal atrial fibrillation: Secondary | ICD-10-CM | POA: Diagnosis not present

## 2014-12-15 ENCOUNTER — Other Ambulatory Visit: Payer: Self-pay

## 2015-01-12 ENCOUNTER — Other Ambulatory Visit: Payer: Self-pay | Admitting: *Deleted

## 2015-01-12 MED ORDER — APIXABAN 5 MG PO TABS: 5.0000 mg | ORAL_TABLET | Freq: Two times a day (BID) | ORAL | Status: DC

## 2015-01-28 ENCOUNTER — Encounter: Payer: Self-pay | Admitting: *Deleted

## 2015-01-29 ENCOUNTER — Encounter: Payer: Self-pay | Admitting: Cardiovascular Disease

## 2015-01-29 ENCOUNTER — Ambulatory Visit (INDEPENDENT_AMBULATORY_CARE_PROVIDER_SITE_OTHER): Payer: Medicare Other | Admitting: *Deleted

## 2015-01-29 DIAGNOSIS — I495 Sick sinus syndrome: Secondary | ICD-10-CM | POA: Diagnosis not present

## 2015-02-04 LAB — CUP PACEART REMOTE DEVICE CHECK
Battery Remaining Longevity: 55 mo
Battery Remaining Percentage: 51 %
Battery Voltage: 2.87 V
Brady Statistic AP VS Percent: 1 %
Brady Statistic AS VP Percent: 11 %
Brady Statistic RA Percent Paced: 87 %
Brady Statistic RV Percent Paced: 99 %
Date Time Interrogation Session: 20160811233134
Lead Channel Impedance Value: 360 Ohm
Lead Channel Pacing Threshold Amplitude: 0.75 V
Lead Channel Pacing Threshold Pulse Width: 0.4 ms
Lead Channel Pacing Threshold Pulse Width: 0.4 ms
Lead Channel Sensing Intrinsic Amplitude: 3.1 mV
Lead Channel Setting Pacing Amplitude: 2.25 V
Lead Channel Setting Sensing Sensitivity: 4 mV
MDC IDC MSMT LEADCHNL RV IMPEDANCE VALUE: 400 Ohm
MDC IDC MSMT LEADCHNL RV PACING THRESHOLD AMPLITUDE: 0.875 V
MDC IDC MSMT LEADCHNL RV SENSING INTR AMPL: 12 mV
MDC IDC SET LEADCHNL RV PACING AMPLITUDE: 1.125
MDC IDC SET LEADCHNL RV PACING PULSEWIDTH: 0.4 ms
MDC IDC STAT BRADY AP VP PERCENT: 89 %
MDC IDC STAT BRADY AS VS PERCENT: 1 %
Pulse Gen Serial Number: 2314326

## 2015-02-04 NOTE — Progress Notes (Signed)
Remote pacemaker transmission.   

## 2015-02-18 ENCOUNTER — Encounter: Payer: Self-pay | Admitting: Cardiology

## 2015-03-16 DIAGNOSIS — Z23 Encounter for immunization: Secondary | ICD-10-CM | POA: Diagnosis not present

## 2015-04-14 DIAGNOSIS — J329 Chronic sinusitis, unspecified: Secondary | ICD-10-CM | POA: Diagnosis not present

## 2015-04-30 ENCOUNTER — Telehealth: Payer: Self-pay | Admitting: Cardiology

## 2015-04-30 ENCOUNTER — Encounter: Payer: Medicare Other | Admitting: *Deleted

## 2015-04-30 NOTE — Telephone Encounter (Signed)
Spoke with pt and reminded pt of remote transmission that is due today. Pt verbalized understanding.   

## 2015-05-01 ENCOUNTER — Encounter: Payer: Self-pay | Admitting: Cardiology

## 2015-05-07 ENCOUNTER — Ambulatory Visit (INDEPENDENT_AMBULATORY_CARE_PROVIDER_SITE_OTHER): Payer: Medicare Other | Admitting: *Deleted

## 2015-05-07 DIAGNOSIS — I480 Paroxysmal atrial fibrillation: Secondary | ICD-10-CM | POA: Diagnosis not present

## 2015-05-07 DIAGNOSIS — M542 Cervicalgia: Secondary | ICD-10-CM | POA: Diagnosis not present

## 2015-05-07 DIAGNOSIS — J449 Chronic obstructive pulmonary disease, unspecified: Secondary | ICD-10-CM | POA: Diagnosis not present

## 2015-05-07 DIAGNOSIS — E441 Mild protein-calorie malnutrition: Secondary | ICD-10-CM | POA: Diagnosis not present

## 2015-05-07 DIAGNOSIS — Z79899 Other long term (current) drug therapy: Secondary | ICD-10-CM | POA: Diagnosis not present

## 2015-05-07 DIAGNOSIS — I495 Sick sinus syndrome: Secondary | ICD-10-CM

## 2015-05-07 DIAGNOSIS — R5383 Other fatigue: Secondary | ICD-10-CM | POA: Diagnosis not present

## 2015-05-07 DIAGNOSIS — R350 Frequency of micturition: Secondary | ICD-10-CM | POA: Diagnosis not present

## 2015-05-07 DIAGNOSIS — I10 Essential (primary) hypertension: Secondary | ICD-10-CM | POA: Diagnosis not present

## 2015-05-08 NOTE — Progress Notes (Signed)
Remote pacemaker transmission.   

## 2015-05-11 DIAGNOSIS — F329 Major depressive disorder, single episode, unspecified: Secondary | ICD-10-CM | POA: Diagnosis not present

## 2015-05-11 DIAGNOSIS — R946 Abnormal results of thyroid function studies: Secondary | ICD-10-CM | POA: Diagnosis not present

## 2015-05-11 DIAGNOSIS — M791 Myalgia: Secondary | ICD-10-CM | POA: Diagnosis not present

## 2015-05-12 DIAGNOSIS — R946 Abnormal results of thyroid function studies: Secondary | ICD-10-CM | POA: Diagnosis not present

## 2015-05-22 LAB — CUP PACEART REMOTE DEVICE CHECK
Battery Remaining Longevity: 48 mo
Battery Remaining Percentage: 46 %
Brady Statistic AP VP Percent: 89 %
Brady Statistic AP VS Percent: 1 %
Brady Statistic AS VP Percent: 11 %
Brady Statistic RA Percent Paced: 87 %
Brady Statistic RV Percent Paced: 99 %
Implantable Lead Implant Date: 19920213
Implantable Lead Implant Date: 19920213
Implantable Lead Location: 753860
Implantable Lead Serial Number: 23000302
Lead Channel Impedance Value: 340 Ohm
Lead Channel Pacing Threshold Amplitude: 0.75 V
Lead Channel Pacing Threshold Amplitude: 0.875 V
Lead Channel Pacing Threshold Pulse Width: 0.4 ms
Lead Channel Sensing Intrinsic Amplitude: 12 mV
Lead Channel Sensing Intrinsic Amplitude: 2.8 mV
Lead Channel Setting Pacing Amplitude: 1.125
Lead Channel Setting Pacing Pulse Width: 0.4 ms
MDC IDC LEAD LOCATION: 753859
MDC IDC MSMT BATTERY VOLTAGE: 2.86 V
MDC IDC MSMT LEADCHNL RV IMPEDANCE VALUE: 380 Ohm
MDC IDC MSMT LEADCHNL RV PACING THRESHOLD PULSEWIDTH: 0.4 ms
MDC IDC SESS DTM: 20161118001418
MDC IDC SET LEADCHNL RA PACING AMPLITUDE: 2.25 V
MDC IDC SET LEADCHNL RV SENSING SENSITIVITY: 4 mV
MDC IDC STAT BRADY AS VS PERCENT: 1 %
Pulse Gen Model: 2110
Pulse Gen Serial Number: 2314326

## 2015-05-25 ENCOUNTER — Encounter: Payer: Self-pay | Admitting: Cardiology

## 2015-06-07 ENCOUNTER — Emergency Department (HOSPITAL_COMMUNITY): Payer: Medicare Other

## 2015-06-07 ENCOUNTER — Encounter (HOSPITAL_COMMUNITY): Payer: Self-pay | Admitting: Emergency Medicine

## 2015-06-07 ENCOUNTER — Inpatient Hospital Stay (HOSPITAL_COMMUNITY): Payer: Medicare Other

## 2015-06-07 ENCOUNTER — Inpatient Hospital Stay (HOSPITAL_COMMUNITY)
Admission: EM | Admit: 2015-06-07 | Discharge: 2015-06-10 | DRG: 871 | Disposition: A | Discharge status: Home / Self Care | Payer: Medicare Other | Attending: Family Medicine | Admitting: Family Medicine

## 2015-06-07 DIAGNOSIS — R0902 Hypoxemia: Secondary | ICD-10-CM

## 2015-06-07 DIAGNOSIS — I509 Heart failure, unspecified: Secondary | ICD-10-CM

## 2015-06-07 DIAGNOSIS — I5031 Acute diastolic (congestive) heart failure: Secondary | ICD-10-CM | POA: Diagnosis present

## 2015-06-07 DIAGNOSIS — E876 Hypokalemia: Secondary | ICD-10-CM | POA: Diagnosis not present

## 2015-06-07 DIAGNOSIS — I10 Essential (primary) hypertension: Secondary | ICD-10-CM | POA: Diagnosis present

## 2015-06-07 DIAGNOSIS — I480 Paroxysmal atrial fibrillation: Secondary | ICD-10-CM | POA: Diagnosis present

## 2015-06-07 DIAGNOSIS — R748 Abnormal levels of other serum enzymes: Secondary | ICD-10-CM | POA: Diagnosis present

## 2015-06-07 DIAGNOSIS — I872 Venous insufficiency (chronic) (peripheral): Secondary | ICD-10-CM | POA: Diagnosis present

## 2015-06-07 DIAGNOSIS — R05 Cough: Secondary | ICD-10-CM | POA: Diagnosis not present

## 2015-06-07 DIAGNOSIS — J441 Chronic obstructive pulmonary disease with (acute) exacerbation: Secondary | ICD-10-CM | POA: Diagnosis present

## 2015-06-07 DIAGNOSIS — J189 Pneumonia, unspecified organism: Secondary | ICD-10-CM

## 2015-06-07 DIAGNOSIS — E440 Moderate protein-calorie malnutrition: Secondary | ICD-10-CM | POA: Diagnosis present

## 2015-06-07 DIAGNOSIS — R059 Cough, unspecified: Secondary | ICD-10-CM

## 2015-06-07 DIAGNOSIS — I351 Nonrheumatic aortic (valve) insufficiency: Secondary | ICD-10-CM | POA: Diagnosis present

## 2015-06-07 DIAGNOSIS — Z87891 Personal history of nicotine dependence: Secondary | ICD-10-CM

## 2015-06-07 DIAGNOSIS — Z950 Presence of cardiac pacemaker: Secondary | ICD-10-CM | POA: Diagnosis not present

## 2015-06-07 DIAGNOSIS — I951 Orthostatic hypotension: Secondary | ICD-10-CM | POA: Diagnosis present

## 2015-06-07 DIAGNOSIS — Z681 Body mass index (BMI) 19 or less, adult: Secondary | ICD-10-CM | POA: Diagnosis not present

## 2015-06-07 DIAGNOSIS — R0602 Shortness of breath: Secondary | ICD-10-CM | POA: Diagnosis not present

## 2015-06-07 DIAGNOSIS — R778 Other specified abnormalities of plasma proteins: Secondary | ICD-10-CM

## 2015-06-07 DIAGNOSIS — R7989 Other specified abnormal findings of blood chemistry: Secondary | ICD-10-CM

## 2015-06-07 DIAGNOSIS — A419 Sepsis, unspecified organism: Principal | ICD-10-CM | POA: Diagnosis present

## 2015-06-07 DIAGNOSIS — M353 Polymyalgia rheumatica: Secondary | ICD-10-CM | POA: Diagnosis present

## 2015-06-07 DIAGNOSIS — J440 Chronic obstructive pulmonary disease with acute lower respiratory infection: Secondary | ICD-10-CM | POA: Diagnosis present

## 2015-06-07 DIAGNOSIS — R0600 Dyspnea, unspecified: Secondary | ICD-10-CM

## 2015-06-07 DIAGNOSIS — R069 Unspecified abnormalities of breathing: Secondary | ICD-10-CM | POA: Diagnosis not present

## 2015-06-07 LAB — BLOOD GAS, ARTERIAL
Acid-Base Excess: 5.2 mmol/L — ABNORMAL HIGH (ref 0.0–2.0)
Bicarbonate: 30.1 mEq/L — ABNORMAL HIGH (ref 20.0–24.0)
Drawn by: 308601
O2 Content: 4 L/min
O2 Saturation: 96.7 %
PCO2 ART: 50.8 mmHg — AB (ref 35.0–45.0)
PH ART: 7.397 (ref 7.350–7.450)
PO2 ART: 99.2 mmHg (ref 80.0–100.0)
Patient temperature: 100.9
TCO2: 27.6 mmol/L (ref 0–100)

## 2015-06-07 LAB — COMPREHENSIVE METABOLIC PANEL
ALK PHOS: 77 U/L (ref 38–126)
ALT: 29 U/L (ref 14–54)
AST: 32 U/L (ref 15–41)
Albumin: 3.4 g/dL — ABNORMAL LOW (ref 3.5–5.0)
Anion gap: 12 (ref 5–15)
BILIRUBIN TOTAL: 1.2 mg/dL (ref 0.3–1.2)
BUN: 16 mg/dL (ref 6–20)
CHLORIDE: 97 mmol/L — AB (ref 101–111)
CO2: 33 mmol/L — ABNORMAL HIGH (ref 22–32)
Calcium: 9.1 mg/dL (ref 8.9–10.3)
Creatinine, Ser: 0.75 mg/dL (ref 0.44–1.00)
GLUCOSE: 184 mg/dL — AB (ref 65–99)
Potassium: 2.7 mmol/L — CL (ref 3.5–5.1)
Sodium: 142 mmol/L (ref 135–145)
Total Protein: 6.7 g/dL (ref 6.5–8.1)

## 2015-06-07 LAB — CBC WITH DIFFERENTIAL/PLATELET
Basophils Absolute: 0 10*3/uL (ref 0.0–0.1)
Basophils Relative: 0 %
EOS PCT: 0 %
Eosinophils Absolute: 0 10*3/uL (ref 0.0–0.7)
HCT: 39.5 % (ref 36.0–46.0)
Hemoglobin: 12.7 g/dL (ref 12.0–15.0)
LYMPHS ABS: 1.7 10*3/uL (ref 0.7–4.0)
LYMPHS PCT: 10 %
MCH: 29.5 pg (ref 26.0–34.0)
MCHC: 32.2 g/dL (ref 30.0–36.0)
MCV: 91.9 fL (ref 78.0–100.0)
MONO ABS: 1 10*3/uL (ref 0.1–1.0)
Monocytes Relative: 6 %
Neutro Abs: 14.5 10*3/uL — ABNORMAL HIGH (ref 1.7–7.7)
Neutrophils Relative %: 84 %
PLATELETS: 265 10*3/uL (ref 150–400)
RBC: 4.3 MIL/uL (ref 3.87–5.11)
RDW: 15 % (ref 11.5–15.5)
WBC: 17.2 10*3/uL — AB (ref 4.0–10.5)

## 2015-06-07 LAB — URINE MICROSCOPIC-ADD ON

## 2015-06-07 LAB — TROPONIN I
Troponin I: 0.28 ng/mL — ABNORMAL HIGH (ref <0.031)
Troponin I: 0.11 ng/mL — ABNORMAL HIGH (ref <0.031)

## 2015-06-07 LAB — PROCALCITONIN: Procalcitonin: <0.1 ng/mL

## 2015-06-07 LAB — URINALYSIS, ROUTINE W REFLEX MICROSCOPIC
Bilirubin Urine: NEGATIVE
GLUCOSE, UA: 500 mg/dL — AB
Ketones, ur: NEGATIVE mg/dL
Leukocytes, UA: NEGATIVE
Nitrite: NEGATIVE
PROTEIN: NEGATIVE mg/dL
Specific Gravity, Urine: 1.014 (ref 1.005–1.030)
pH: 6 (ref 5.0–8.0)

## 2015-06-07 LAB — BRAIN NATRIURETIC PEPTIDE: B Natriuretic Peptide: 536.4 pg/mL — ABNORMAL HIGH (ref 0.0–100.0)

## 2015-06-07 LAB — INFLUENZA PANEL BY PCR (TYPE A & B)
H1N1FLUPCR: NOT DETECTED
INFLBPCR: NEGATIVE
Influenza A By PCR: NEGATIVE

## 2015-06-07 LAB — HIV ANTIBODY (ROUTINE TESTING W REFLEX): HIV Screen 4th Generation wRfx: NONREACTIVE

## 2015-06-07 LAB — LACTIC ACID, PLASMA: Lactic Acid, Venous: 1.6 mmol/L (ref 0.5–2.0)

## 2015-06-07 LAB — I-STAT CG4 LACTIC ACID, ED
LACTIC ACID, VENOUS: 1.94 mmol/L (ref 0.5–2.0)
Lactic Acid, Venous: 3.73 mmol/L (ref 0.5–2.0)

## 2015-06-07 MED ORDER — VANCOMYCIN HCL 500 MG IV SOLR: 500.0000 mg | Freq: Two times a day (BID) | INTRAVENOUS | Status: DC

## 2015-06-07 MED ORDER — ADULT MULTIVITAMIN W/MINERALS CH
1.0000 | ORAL_TABLET | Freq: Every day | ORAL | Status: DC
  Administered 2015-06-07 – 2015-06-10 (×4): 1 via ORAL
  Filled 2015-06-07 (×7): qty 1

## 2015-06-07 MED ORDER — METOPROLOL SUCCINATE ER 25 MG PO TB24
12.5000 mg | ORAL_TABLET | Freq: Every day | ORAL | Status: DC
  Administered 2015-06-07 – 2015-06-09 (×3): 12.5 mg via ORAL
  Filled 2015-06-07 (×3): qty 1

## 2015-06-07 MED ORDER — APIXABAN 2.5 MG PO TABS
2.5000 mg | ORAL_TABLET | Freq: Two times a day (BID) | ORAL | Status: DC
  Administered 2015-06-07 – 2015-06-10 (×6): 2.5 mg via ORAL
  Filled 2015-06-07 (×6): qty 1

## 2015-06-07 MED ORDER — PIPERACILLIN-TAZOBACTAM 3.375 G IVPB: 3.3750 g | Freq: Three times a day (TID) | INTRAVENOUS | Status: DC

## 2015-06-07 MED ORDER — SODIUM CHLORIDE 0.9 % IV BOLUS (SEPSIS)
1000.0000 mL | INTRAVENOUS | Status: DC
  Administered 2015-06-07 (×2): 1000 mL via INTRAVENOUS

## 2015-06-07 MED ORDER — PIPERACILLIN-TAZOBACTAM 3.375 G IVPB 30 MIN
3.3750 g | Freq: Once | INTRAVENOUS | Status: AC
  Administered 2015-06-07: 3.375 g via INTRAVENOUS
  Filled 2015-06-07: qty 50

## 2015-06-07 MED ORDER — VANCOMYCIN HCL IN DEXTROSE 1-5 GM/200ML-% IV SOLN
1000.0000 mg | Freq: Once | INTRAVENOUS | Status: AC
  Administered 2015-06-07: 1000 mg via INTRAVENOUS
  Filled 2015-06-07: qty 200

## 2015-06-07 MED ORDER — VITAMIN D3 25 MCG (1000 UNIT) PO TABS
1000.0000 [IU] | ORAL_TABLET | Freq: Every day | ORAL | Status: DC
  Administered 2015-06-07 – 2015-06-10 (×4): 1000 [IU] via ORAL
  Filled 2015-06-07 (×8): qty 1

## 2015-06-07 MED ORDER — METOPROLOL SUCCINATE ER 25 MG PO TB24
25.0000 mg | ORAL_TABLET | Freq: Every day | ORAL | Status: DC
  Administered 2015-06-07 – 2015-06-10 (×4): 25 mg via ORAL
  Filled 2015-06-07 (×4): qty 1

## 2015-06-07 MED ORDER — SODIUM CHLORIDE 0.9 % IV SOLN
1000.0000 mL | INTRAVENOUS | Status: DC
  Administered 2015-06-07: 1000 mL via INTRAVENOUS

## 2015-06-07 MED ORDER — ACETAMINOPHEN 325 MG PO TABS: 650.0000 mg | ORAL_TABLET | Freq: Four times a day (QID) | ORAL | Status: DC | PRN

## 2015-06-07 MED ORDER — ALBUTEROL SULFATE (2.5 MG/3ML) 0.083% IN NEBU
2.5000 mg | INHALATION_SOLUTION | RESPIRATORY_TRACT | Status: DC | PRN
  Administered 2015-06-07 – 2015-06-08 (×2): 2.5 mg via RESPIRATORY_TRACT
  Filled 2015-06-07 (×2): qty 3

## 2015-06-07 MED ORDER — OSELTAMIVIR PHOSPHATE 75 MG PO CAPS
75.0000 mg | ORAL_CAPSULE | Freq: Two times a day (BID) | ORAL | Status: DC
  Administered 2015-06-07: 75 mg via ORAL
  Filled 2015-06-07 (×2): qty 1

## 2015-06-07 MED ORDER — ALPRAZOLAM 0.25 MG PO TABS
0.2500 mg | ORAL_TABLET | Freq: Every evening | ORAL | Status: DC | PRN
  Administered 2015-06-07: 0.25 mg via ORAL
  Filled 2015-06-07: qty 1

## 2015-06-07 MED ORDER — PREDNISONE 20 MG PO TABS
20.0000 mg | ORAL_TABLET | Freq: Every day | ORAL | Status: DC
  Administered 2015-06-07 – 2015-06-08 (×2): 20 mg via ORAL
  Filled 2015-06-07 (×2): qty 1

## 2015-06-07 MED ORDER — POTASSIUM CHLORIDE 10 MEQ/100ML IV SOLN
10.0000 meq | INTRAVENOUS | Status: AC
Start: 2015-06-07 — End: 2015-06-07
  Administered 2015-06-07 (×4): 10 meq via INTRAVENOUS
  Filled 2015-06-07 (×5): qty 100

## 2015-06-07 MED ORDER — ASPIRIN 81 MG PO CHEW
324.0000 mg | CHEWABLE_TABLET | Freq: Once | ORAL | Status: DC
  Filled 2015-06-07: qty 4

## 2015-06-07 MED ORDER — DEXTROSE 5 % IV SOLN
500.0000 mg | INTRAVENOUS | Status: DC
  Administered 2015-06-07 – 2015-06-09 (×3): 500 mg via INTRAVENOUS
  Filled 2015-06-07 (×3): qty 500

## 2015-06-07 MED ORDER — GUAIFENESIN-DM 100-10 MG/5ML PO SYRP
5.0000 mL | ORAL_SOLUTION | ORAL | Status: DC | PRN
  Administered 2015-06-07: 5 mL via ORAL
  Filled 2015-06-07: qty 10

## 2015-06-07 MED ORDER — MIRTAZAPINE 7.5 MG PO TABS
7.5000 mg | ORAL_TABLET | Freq: Every day | ORAL | Status: DC
  Administered 2015-06-07 – 2015-06-09 (×3): 7.5 mg via ORAL
  Filled 2015-06-07 (×4): qty 1

## 2015-06-07 MED ORDER — APIXABAN 5 MG PO TABS
5.0000 mg | ORAL_TABLET | Freq: Two times a day (BID) | ORAL | Status: DC
  Administered 2015-06-07: 5 mg via ORAL
  Filled 2015-06-07: qty 1

## 2015-06-07 MED ORDER — METOPROLOL SUCCINATE ER 25 MG PO TB24: 312.5000 mg | ORAL_TABLET | Freq: Two times a day (BID) | ORAL | Status: DC

## 2015-06-07 MED ORDER — IPRATROPIUM-ALBUTEROL 0.5-2.5 (3) MG/3ML IN SOLN
3.0000 mL | Freq: Once | RESPIRATORY_TRACT | Status: AC
  Administered 2015-06-07: 3 mL via RESPIRATORY_TRACT
  Filled 2015-06-07: qty 3

## 2015-06-07 MED ORDER — DEXTROSE 5 % IV SOLN
1.0000 g | INTRAVENOUS | Status: DC
  Administered 2015-06-07 – 2015-06-09 (×3): 1 g via INTRAVENOUS
  Filled 2015-06-07 (×3): qty 10

## 2015-06-07 MED ORDER — ACETAMINOPHEN 500 MG PO TABS
1000.0000 mg | ORAL_TABLET | Freq: Once | ORAL | Status: AC
  Administered 2015-06-07: 1000 mg via ORAL
  Filled 2015-06-07: qty 2

## 2015-06-07 NOTE — Progress Notes (Signed)
ANTIBIOTIC CONSULT NOTE - INITIAL  Pharmacy Consult for Vancomycin & Zosyn Indication: Sepsis  No Known Allergies  Patient Measurements: Height: 5\' 5"  (165.1 cm) Weight: 117 lb (53.071 kg) IBW/kg (Calculated) : 57  Vital Signs: Temp: 102.9 F (39.4 C) (12/18 0141) Temp Source: Oral (12/18 0141) BP: 181/89 mmHg (12/18 0141) Pulse Rate: 94 (12/18 0141) Intake/Output from previous day:   Intake/Output from this shift:    Labs:  Recent Labs  06/07/15 0200  WBC 17.2*  HGB 12.7  PLT 265  CREATININE 0.75   Estimated Creatinine Clearance: 45.4 mL/min (by C-G formula based on Cr of 0.75). No results for input(s): VANCOTROUGH, VANCOPEAK, VANCORANDOM, GENTTROUGH, GENTPEAK, GENTRANDOM, TOBRATROUGH, TOBRAPEAK, TOBRARND, AMIKACINPEAK, AMIKACINTROU, AMIKACIN in the last 72 hours.   Microbiology: No results found for this or any previous visit (from the past 720 hour(s)).  Medical History: Past Medical History  Diagnosis Date  . Syncope   . COPD (chronic obstructive pulmonary disease) ([place])   . DM (dermatomyositis)   . Orthostatic hypotension   . Systemic hypertension   . Thoracic kyphosis   . Scoliosis   . Pacemaker 07/13/2013    Her original pacemaker and the current leads were implanted in 1992. She has had 2 generator change out, most recently in 2010. Her device is a Buyer, retail 2110 non RF dual-chamber pacemaker with a battery longevity estimated at about 7 years. The atrial lead is a St. Jude [id number] and the ventricular lead was a Biotronik [id number].   [name] CHB (complete heart block) ([place]) 07/13/2013    Pacemaker dependent  . SSS (sick sinus syndrome) ([place]) 07/13/2013  . Paroxysmal atrial fibrillation ([place]) 09/04/2014    Medications:  Scheduled:   Infusions:  . sodium chloride 1,000 mL (06/07/15 0216)  . piperacillin-tazobactam (ZOSYN)  IV    . sodium chloride 1,000 mL (06/07/15 0215)  . vancomycin    . vancomycin 1,000 mg (06/07/15 0221)   Assessment:  81 yr female  with shortness of breath.  H/O COPD and CHF  Febrile, elevated WBC  Code Sepsis called;  Vancomycin 1gm and Zosyn 3.375gm IV x 1 dose each ordered in ED  Pharmacy consulted to dose Vancomycin & Zosyn   12/18 >>vanc >> 12/18 >>zosyn >>    12/18 blood: 12/18 urine: 12/18 sputum:  12/18 influenza panel:  Trough/Dose change info:   Goal of Therapy:  Vancomycin trough level 15-20 mcg/ml  Plan:  Measure antibiotic drug levels at steady state Follow up culture results  Zosyn 3.375gm IV q8h (each dose infused over 4 hrs) Vancomycin 500mg  IV q12h  Everette Rank, PharmD 06/07/2015,3:04 AM

## 2015-06-07 NOTE — ED Notes (Signed)
Bed: [id number] Expected date:  Expected time:  Means of arrival:  Comments: Respiratory distress/CHF

## 2015-06-07 NOTE — Progress Notes (Signed)
Echocardiogram 2D Echocardiogram has been performed.  Tresa Res 06/07/2015, 11:14 AM

## 2015-06-07 NOTE — ED Provider Notes (Signed)
By signing my name below, I, Hansel Feinstein, attest that this documentation has been prepared under the direction and in the presence of [place], DO. Electronically Signed: Hansel Feinstein, ED Scribe. 06/07/2015. 1:55 AM.   TIME SEEN: 1:41 AM   CHIEF COMPLAINT:  Chief Complaint  Patient presents with  . Shortness of Breath     HPI:  HPI Comments: [name] is a 81 y.o. female with h/o COPD, HTN, sick sinus syndrome status post pacemaker, paroxysmal atrial fibrillation on Eliquis who presents to the Emergency Department complaining of moderate productive cough with yellow, green phlegm onset last night and worsened this morning with associated SOB. She states that her symptoms are worsened when she lies flat and improved in the semi-fowlers position.  Pt is not O2 dependent at home. She has had a flu shot this year, but does not recall if she has had a Pneumonia vaccination. PCP is Dr. Felipa Eth. Pt is a former smoker. No h/o asthma. She denies CP, HA, abdominal pain. Has a fever in the emergency room. States she was not aware that she had a fever at home.  Pt is a full code.   ROS: See HPI Constitutional:  fever  Eyes: no drainage  ENT: no runny nose   Cardiovascular:  no chest pain  Resp: Positive for cough, SOB GI: no vomiting, abdominal pain GU: no dysuria Integumentary: no rash  Allergy: no hives  Musculoskeletal: no leg swelling  Neurological: no slurred speech, HA ROS otherwise negative  PAST MEDICAL HISTORY/PAST SURGICAL HISTORY:  Past Medical History  Diagnosis Date  . Syncope   . COPD (chronic obstructive pulmonary disease) ([place])   . DM (dermatomyositis)   . Orthostatic hypotension   . Systemic hypertension   . Thoracic kyphosis   . Scoliosis   . Pacemaker 07/13/2013    Her original pacemaker and the current leads were implanted in 1992. She has had 2 generator change out, most recently in 2010. Her device is a Buyer, retail 2110 non RF dual-chamber  pacemaker with a battery longevity estimated at about 7 years. The atrial lead is a St. Jude [id number] and the ventricular lead was a Biotronik [id number].   [name] CHB (complete heart block) ([place]) 07/13/2013    Pacemaker dependent  . SSS (sick sinus syndrome) ([place]) 07/13/2013  . Paroxysmal atrial fibrillation ([place]) 09/04/2014    MEDICATIONS:  Prior to Admission medications   Medication Sig Start Date End Date Taking? Authorizing Provider  ALPRAZolam Duanne Moron) 0.25 MG tablet Take 0.25 mg by mouth at bedtime as needed for sleep.    Historical Provider, MD  apixaban (ELIQUIS) 5 MG TABS tablet Take 1 tablet (5 mg total) by mouth 2 (two) times daily. 01/12/15   Mihai Croitoru, MD  cholecalciferol (VITAMIN D) 1000 UNITS tablet Take 1,000 Units by mouth daily.    Historical Provider, MD  fludrocortisone (FLORINEF) 0.1 MG tablet Take 0.1 mg by mouth 2 (two) times daily.     Historical Provider, MD  metoprolol tartrate (LOPRESSOR) 25 MG tablet Take metoprolol 25 mg in the am and 12.5mg  in the pm. Pt. Stated understanding of instructions 11/02/12   Terance Ice, MD  mirtazapine (REMERON) 15 MG tablet Take 7.5 mg by mouth at bedtime. Take 1/2 tablet at night.    Historical Provider, MD  Multiple Vitamin (MULTIVITAMIN) tablet Take 1 tablet by mouth daily.    Historical Provider, MD    ALLERGIES:  No Known Allergies  SOCIAL HISTORY:  Social History  Substance Use Topics  . Smoking status: Never Smoker   . Smokeless tobacco: Not on file  . Alcohol Use: No    FAMILY HISTORY: History reviewed. No pertinent family history.  EXAM: BP 181/89 mmHg  Pulse 94  Temp(Src) 102.9 F (39.4 C) (Oral)  Resp 30  SpO2 88% CONSTITUTIONAL: Alert and oriented and responds appropriately to questions. Elderly; febrile, non-toxic. Appears uncomfortable.  HEAD: Normocephalic EYES: Conjunctivae clear, PERRL ENT: normal nose; no rhinorrhea; moist mucous membranes; pharynx without lesions noted NECK: Supple, no meningismus, no  LAD  CARD: RRR; S1 and S2 appreciated; no murmurs, no clicks, no rubs, no gallops.  RESP: Normal chest excursion without splinting; no wheezes, no rhonchi, no rales, no hypoxia or respiratory distress, speaking full sentences. Tachypneic; diminished at bases bilaterally.  ABD/GI: Normal bowel sounds; non-distended; soft, non-tender, no rebound, no guarding, no peritoneal signs BACK:  The back appears normal and is non-tender to palpation, there is no CVA tenderness EXT: Normal ROM in all joints; non-tender to palpation; no edema; normal capillary refill; no cyanosis, no calf tenderness or swelling    SKIN: Normal color for age and race; warm, no rash NEURO: Moves all extremities equally, sensation to light touch intact diffusely, cranial nerves II through XII intact PSYCH: The patient's mood and manner are appropriate. Grooming and personal hygiene are appropriate.  MEDICAL DECISION MAKING: Patient here to meet sepsis criteria. She is febrile, tachypneic, hypoxic. Suspect pneumonia versus influenza. EKG shows a paced rhythm she does have some ST elevation but does not appear to meet [place] criteria. No chest pain. We'll obtain labs, cultures, chest x-ray, urine. Will give IV fluids and antibiotics.  ED PROGRESS: Pt's labs show normal lactate. She has a leukocytosis of 17 with left shift. Chest x-ray shows mild interstitial edema and also patchy opacity the right lung base likely pneumonia. BNP pending. She has received 1 L of IV fluids and is hemodynamically stable. We'll stop IV fluid boluses and give IV gentle hydration with 75 mL/h given she is septic. Troponin is slightly positive. This is in the setting of sepsis however suspect this is secondary to demand ischemia. [facility] consult cardiology and hospitalist. Patient and family comfortable with this plan and updated.   3:50 AM  Pt's cardiologist is Dr. Sallyanne Kuster.  D/w Dr. Clayborne Artist, cardiology fellow.  He is comfortable with the patient staying here  at [facility].  States they can see the patient in consult and asked that if medicine would like a formal consult in the morning he asked that the repage cardiology.  Will give patient aspirin. Cardiology does recommend trending her troponins. He does not feel patient will need a cardiac catheterization unless she develops symptoms of ACS or her troponins continue to rise.  4:30 AM  D/w Dr. Alcario Drought for admission to telemetry. I will place will orders.   EKG Interpretation  Date/Time:  Sunday June 07 2015 01:41:03 EST Ventricular Rate:  95 PR Interval:  155 QRS Duration: 182 QT Interval:  393 QTC Calculation: 494 R Axis:   -11 Text Interpretation:  Ventricular-paced rhythm No further analysis attempted due to paced rhythm Confirmed by WARD,  DO, KRISTEN [id number]) on 06/07/2015 1:45:47 AM         CRITICAL CARE Performed by: Delice Bison Ward, DO    Total critical care time: 45 minutes - sepsis, related troponin  Critical care time was exclusive of separately billable procedures and treating other patients.  Critical care was necessary to treat or  prevent imminent or life-threatening deterioration.  Critical care was time spent personally by me on the following activities: development of treatment plan with patient and/or surrogate as well as nursing, discussions with consultants, evaluation of patient's response to treatment, examination of patient, obtaining history from patient or surrogate, ordering and performing treatments and interventions, ordering and review of laboratory studies, ordering and review of radiographic studies, pulse oximetry and re-evaluation of patient's condition.     I personally performed the services described in this documentation, which was scribed in my presence. The recorded information has been reviewed and is accurate.   [place], DO 06/07/15 0930

## 2015-06-07 NOTE — ED Notes (Signed)
Brought in by EMS from home with c/o shortness of breath.  Per EMS, pt reported that she has not been feeling well throughout the day (yesterday).  Few minutes ago, she started having shortness of breath.  Has hx of COPD and CHF.  Was given Solu-Medrol 125 mg IV and neb tx of Albuterol 10 mg and Atrovent 0.5 mg;  was also given Zofran 4 mg IV d/t persistent dry heaves en route to ED.   Arrived to ED with oral temp of T102.9.

## 2015-06-07 NOTE — H&P (Signed)
[facility] History and Physical  [name] [id number] DOB: 09/18/1930 DOA: 06/07/2015  Referring physician: EDP PCP: Mathews Argyle, MD   Chief Complaint: SOB   HPI: [name] is a 81 y.o. female with h/o COPD, HTN, pacemaker, PAF.  Patient presents to the ED with c/o productive cough, onset about 3 days ago.  Symptoms worsened last evening.  Productive of greenish purulent sputum.  There is also associated orthopnea as well.  Patient not O2 dependent at home.  Did have flu shot this year, dosent recall if she has had PNA vaccination.  Review of Systems: Systems reviewed.  As above, otherwise negative  Past Medical History  Diagnosis Date  . Syncope   . COPD (chronic obstructive pulmonary disease) ([place])   . DM (dermatomyositis)   . Orthostatic hypotension   . Systemic hypertension   . Thoracic kyphosis   . Scoliosis   . Pacemaker 07/13/2013    Her original pacemaker and the current leads were implanted in 1992. She has had 2 generator change out, most recently in 2010. Her device is a Buyer, retail 2110 non RF dual-chamber pacemaker with a battery longevity estimated at about 7 years. The atrial lead is a St. Jude [id number] and the ventricular lead was a Biotronik [id number].   [name] CHB (complete heart block) ([place]) 07/13/2013    Pacemaker dependent  . SSS (sick sinus syndrome) ([place]) 07/13/2013  . Paroxysmal atrial fibrillation ([place]) 09/04/2014   Past Surgical History  Procedure Laterality Date  . Permanent pacemaker generator change  03/27/2009    St.Jude  . Nm myocar perf wall motion  09/13/2011    Low risk  . US echocardiography  03/28/2012    Mod LAE,mild MR,aortic sclerosis w/mod AI,mod. TR,mild PI,Stage I diastolic dysfunction   Social History:  reports that she has never smoked. She does not have any smokeless tobacco history on file. She reports that she does not drink alcohol or use illicit drugs.  No Known Allergies  History reviewed. No pertinent  family history.   Prior to Admission medications   Medication Sig Start Date End Date Taking? Authorizing Provider  ALPRAZolam Duanne Moron) 0.25 MG tablet Take 0.25 mg by mouth at bedtime as needed for sleep.   Yes Historical Provider, MD  apixaban (ELIQUIS) 5 MG TABS tablet Take 1 tablet (5 mg total) by mouth 2 (two) times daily. 01/12/15  Yes Mihai Croitoru, MD  cholecalciferol (VITAMIN D) 1000 UNITS tablet Take 1,000 Units by mouth daily.   Yes Historical Provider, MD  metoprolol succinate (TOPROL-XL) 25 MG 24 hr tablet Take 12.5-25 tablets by mouth 2 (two) times daily. Take 1 tablet in the morning and 1/2 tablet in the evening 05/15/15  Yes Historical Provider, MD  mirtazapine (REMERON) 15 MG tablet Take 7.5 mg by mouth at bedtime. Take 1/2 tablet at night.   Yes Historical Provider, MD  Multiple Vitamin (MULTIVITAMIN) tablet Take 1 tablet by mouth daily.   Yes Historical Provider, MD  predniSONE (DELTASONE) 20 MG tablet Take 1 tablet by mouth daily. 05/12/15  Yes Historical Provider, MD   Physical Exam: Filed Vitals:   06/07/15 0430 06/07/15 0500  BP: 128/54 130/57  Pulse: 70 69  Temp:    Resp: 22 22    BP 130/57 mmHg  Pulse 69  Temp(Src) 102.9 F (39.4 C) (Oral)  Resp 22  Ht 5\' 5"  (1.651 m)  Wt 53.071 kg (117 lb)  BMI 19.47 kg/m2  SpO2 97%  General Appearance:  Sleepy but is oriented, no distress, appears stated age  Head:    Normocephalic, atraumatic  Eyes:    PERRL, EOMI, sclera non-icteric        Nose:   Nares without drainage or epistaxis. Mucosa, turbinates normal  Throat:   Moist mucous membranes. Oropharynx without erythema or exudate.  Neck:   Supple. No carotid bruits.  No thyromegaly.  No lymphadenopathy.   Back:     No CVA tenderness, no spinal tenderness  Lungs:     Diminished at bases bilaterally.  Chest wall:    No tenderness to palpitation  Heart:    Regular rate and rhythm without murmurs, gallops, rubs  Abdomen:     Soft, non-tender, nondistended, normal  bowel sounds, no organomegaly  Genitalia:    deferred  Rectal:    deferred  Extremities:   No clubbing, cyanosis or edema.  Pulses:   2+ and symmetric all extremities  Skin:   Skin color, texture, turgor normal, no rashes or lesions  Lymph nodes:   Cervical, supraclavicular, and axillary nodes normal  Neurologic:   CNII-XII intact. Normal strength, sensation and reflexes      throughout    Labs on Admission:  Basic Metabolic Panel:  Recent Labs Lab 06/07/15 0200  NA 142  K 2.7*  CL 97*  CO2 33*  GLUCOSE 184*  BUN 16  CREATININE 0.75  CALCIUM 9.1   Liver Function Tests:  Recent Labs Lab 06/07/15 0200  AST 32  ALT 29  ALKPHOS 77  BILITOT 1.2  PROT 6.7  ALBUMIN 3.4*   No results for input(s): LIPASE, AMYLASE in the last 168 hours. No results for input(s): AMMONIA in the last 168 hours. CBC:  Recent Labs Lab 06/07/15 0200  WBC 17.2*  NEUTROABS 14.5*  HGB 12.7  HCT 39.5  MCV 91.9  PLT 265   Cardiac Enzymes:  Recent Labs Lab 06/07/15 0200  TROPONINI 0.28*    BNP (last 3 results) No results for input(s): PROBNP in the last 8760 hours. CBG: No results for input(s): GLUCAP in the last 168 hours.  Radiological Exams on Admission: Dg Chest Port 1 View  06/07/2015  CLINICAL DATA:  Cough and shortness of breath. EXAM: PORTABLE CHEST 1 VIEW COMPARISON:  07/17/2014 FINDINGS: Dual lead right-sided pacemaker remains in place. Cardiomegaly, progressed from prior exam. Diffuse interstitial opacities, consistent with pulmonary edema. Ill-defined opacity at the right lung base. The lungs are hyperinflated. Blunting of costophrenic angles, may be secondary to hyperinflation or small pleural effusions. No pneumothorax. The bones are under mineralized. IMPRESSION: Cardiomegaly and interstitial edema, findings most consistent with CHF. Patchy opacity at the right lung base, superimposed atelectasis, pneumonia, or less likely vascular structures. Electronically Signed   By:  Jeb Levering M.D.   On: 06/07/2015 02:50    EKG: Independently reviewed.  Assessment/Plan Principal Problem:   CAP (community acquired pneumonia) Active Problems:   Sepsis ([place])   Acute CHF (congestive heart failure) ([place])   1. CAP causing sepsis -  1. PNA pathway 2. Narrowing ABx to rocephin / azithromycin (got zosyn vanc in ED x1) 3. Actually decreasing IVF rate due to findings of CHF (new) on CXR 4. Tylenol for fever 5. Cultures pending 6. Flu panel pending 7. Empiric tamiflu 2. Acute CHF - no history of same 1. Serial trops, EDP spoke with cards but unless troponins rise she is good to stay over here, no plans to cath at this point. 2. 2d echo ordered 3. Decreasing IVF  a this point due to CHF findings, rate only 50 cc/hr, saline lock this when she starts eating. 3. PMR - continue home prednisone 4. H/o A.Fib - continue eliquis, metoprolol   Code Status: Full  Family Communication: Daughter at bedside Disposition Plan: Admit to inpatient   Time spent: 52 min  GARDNER, JARED M. [facility] Pager [phone]  If 7AM-7PM, please contact the day team taking care of the patient [email or web address] Password [facility] 06/07/2015, 5:33 AM

## 2015-06-07 NOTE — Progress Notes (Signed)
Pt seen and examined, admitted earlier this am 84/F with productive cough, congestion, dyspnea -CXR with CAP and ? CHF -continue Current Abx, FU Flu PCR -check ECHO, elevated troponin improving-due to dedams -Stop IVF -continue home meds-prednisone, apixaban -repeat lactic acid  Domenic Polite, MD [phone]

## 2015-06-08 ENCOUNTER — Inpatient Hospital Stay (HOSPITAL_COMMUNITY): Payer: Medicare Other

## 2015-06-08 DIAGNOSIS — I5031 Acute diastolic (congestive) heart failure: Secondary | ICD-10-CM

## 2015-06-08 LAB — CBC
HCT: 36.2 % (ref 36.0–46.0)
Hemoglobin: 11.2 g/dL — ABNORMAL LOW (ref 12.0–15.0)
MCH: 29.6 pg (ref 26.0–34.0)
MCHC: 30.9 g/dL (ref 30.0–36.0)
MCV: 95.8 fL (ref 78.0–100.0)
PLATELETS: 268 10*3/uL (ref 150–400)
RBC: 3.78 MIL/uL — AB (ref 3.87–5.11)
RDW: 15.3 % (ref 11.5–15.5)
WBC: 14.1 10*3/uL — ABNORMAL HIGH (ref 4.0–10.5)

## 2015-06-08 LAB — EXPECTORATED SPUTUM ASSESSMENT W GRAM STAIN, RFLX TO RESP C

## 2015-06-08 LAB — BASIC METABOLIC PANEL
Anion gap: 7 (ref 5–15)
BUN: 15 mg/dL (ref 6–20)
CO2: 36 mmol/L — ABNORMAL HIGH (ref 22–32)
Calcium: 8.3 mg/dL — ABNORMAL LOW (ref 8.9–10.3)
Chloride: 99 mmol/L — ABNORMAL LOW (ref 101–111)
Creatinine, Ser: 0.58 mg/dL (ref 0.44–1.00)
GFR calc Af Amer: >60 mL/min (ref >60)
GLUCOSE: 117 mg/dL — AB (ref 65–99)
POTASSIUM: 3.5 mmol/L (ref 3.5–5.1)
Sodium: 142 mmol/L (ref 135–145)

## 2015-06-08 LAB — URINE CULTURE

## 2015-06-08 LAB — EXPECTORATED SPUTUM ASSESSMENT W REFEX TO RESP CULTURE

## 2015-06-08 MED ORDER — IPRATROPIUM-ALBUTEROL 0.5-2.5 (3) MG/3ML IN SOLN
3.0000 mL | RESPIRATORY_TRACT | Status: DC
  Administered 2015-06-08 – 2015-06-09 (×10): 3 mL via RESPIRATORY_TRACT
  Filled 2015-06-08 (×10): qty 3

## 2015-06-08 MED ORDER — GUAIFENESIN ER 600 MG PO TB12
600.0000 mg | ORAL_TABLET | Freq: Two times a day (BID) | ORAL | Status: DC
  Administered 2015-06-08 – 2015-06-10 (×5): 600 mg via ORAL
  Filled 2015-06-08 (×5): qty 1

## 2015-06-08 MED ORDER — FUROSEMIDE 10 MG/ML IJ SOLN
20.0000 mg | Freq: Once | INTRAMUSCULAR | Status: AC
  Administered 2015-06-08: 20 mg via INTRAVENOUS
  Filled 2015-06-08: qty 2

## 2015-06-08 MED ORDER — ALPRAZOLAM 0.5 MG PO TABS
0.5000 mg | ORAL_TABLET | Freq: Three times a day (TID) | ORAL | Status: DC | PRN
  Administered 2015-06-09: 0.5 mg via ORAL
  Filled 2015-06-08: qty 1

## 2015-06-08 MED ORDER — BUDESONIDE 0.25 MG/2ML IN SUSP: 0.2500 mg | Freq: Two times a day (BID) | RESPIRATORY_TRACT | Status: DC | PRN

## 2015-06-08 MED ORDER — METHYLPREDNISOLONE SODIUM SUCC 125 MG IJ SOLR
60.0000 mg | Freq: Two times a day (BID) | INTRAMUSCULAR | Status: DC
  Administered 2015-06-08 (×2): 60 mg via INTRAVENOUS
  Filled 2015-06-08 (×2): qty 2

## 2015-06-08 MED ORDER — LORAZEPAM 2 MG/ML IJ SOLN
0.5000 mg | Freq: Once | INTRAMUSCULAR | Status: AC
  Administered 2015-06-08: 0.5 mg via INTRAVENOUS
  Filled 2015-06-08: qty 1

## 2015-06-08 NOTE — Progress Notes (Signed)
[facility] PROGRESS NOTE  [name] [id number] DOB: 1931/05/27 DOA: 06/07/2015 PCP: Mathews Argyle, MD  Assessment/Plan: 1. CAP with COPD Exacerbation -clinically some deterioration -increase IV solumedrol, Nebs, Abx -repeat CXR -benzos for anxiety  2. Elevated troponin -due to demand, improved -ECHo with normal EF and wall motion -ASA/Toprol  3. Mild Acute Diastolic CHF -continue Toprol, IV lasix x1 today  4. P.Afib -rate fluctuating with nebs -continue Apixaban  5. H/o PMR -continue prednisone at home dose  6. Moderate malnutrition -supplements as tolerated  DVT proph: on apixaban  Code Status: Full COde Family Communication: daughter   Disposition Plan: home when improved   HPI/Subjective: Severe congestion and wheezing earlier this am  Objective: Filed Vitals:   06/07/15 2248 06/08/15 0558  BP:  100/61  Pulse: 76 73  Temp:  99 F (37.2 C)  Resp: 22 22    Intake/Output Summary (Last 24 hours) at 06/08/15 1354 Last data filed at 06/08/15 0616  Gross per 24 hour  Intake    540 ml  Output      0 ml  Net    540 ml   Filed Weights   06/07/15 0214 06/07/15 0601 06/07/15 1500  Weight: 53.071 kg (117 lb) 54.7 kg (120 lb 9.5 oz) 53.887 kg (118 lb 12.8 oz)    Exam:   General:  Frail, cachectic, chronically ill appearing  Cardiovascular: S1S2/RRR  Respiratory: diffuse wheezes and ronchi  Abdomen: soft, Nt, BS present  Musculoskeletal: No edema   Data Reviewed: Basic Metabolic Panel:  Recent Labs Lab 06/07/15 0200 06/08/15 0427  NA 142 142  K 2.7* 3.5  CL 97* 99*  CO2 33* 36*  GLUCOSE 184* 117*  BUN 16 15  CREATININE 0.75 0.58  CALCIUM 9.1 8.3*   Liver Function Tests:  Recent Labs Lab 06/07/15 0200  AST 32  ALT 29  ALKPHOS 77  BILITOT 1.2  PROT 6.7  ALBUMIN 3.4*   No results for input(s): LIPASE, AMYLASE in the last 168 hours. No results for input(s): AMMONIA in the last 168 hours. CBC:  Recent  Labs Lab 06/07/15 0200 06/08/15 0427  WBC 17.2* 14.1*  NEUTROABS 14.5*  --   HGB 12.7 11.2*  HCT 39.5 36.2  MCV 91.9 95.8  PLT 265 268   Cardiac Enzymes:  Recent Labs Lab 06/07/15 0200 06/07/15 0514  TROPONINI 0.28* 0.11*   BNP (last 3 results)  Recent Labs  06/07/15 0205  BNP 536.4*    ProBNP (last 3 results) No results for input(s): PROBNP in the last 8760 hours.  CBG: No results for input(s): GLUCAP in the last 168 hours.  Recent Results (from the past 240 hour(s))  Blood Culture (routine x 2)     Status: None (Preliminary result)   Collection Time: 06/07/15  2:12 AM  Result Value Ref Range Status   Specimen Description BLOOD RIGHT ANTECUBITAL  Final   Special Requests BOTTLES DRAWN AEROBIC AND ANAEROBIC 10ML  Final   Culture   Final    NO GROWTH 1 DAY Performed at [facility]    Report Status PENDING  Incomplete  Blood Culture (routine x 2)     Status: None (Preliminary result)   Collection Time: 06/07/15  2:13 AM  Result Value Ref Range Status   Specimen Description BLOOD LEFT ANTECUBITAL  Final   Special Requests BOTTLES DRAWN AEROBIC AND ANAEROBIC 5ML  Final   Culture   Final    NO GROWTH 1 DAY Performed at [facility]    Report Status PENDING  Incomplete  Culture, respiratory (NON-Expectorated)     Status: None (Preliminary result)   Collection Time: 06/07/15  2:41 AM  Result Value Ref Range Status   Specimen Description SPU  Final   Special Requests NONE  Final   Gram Stain   Final    ABUNDANT WBC PRESENT,BOTH PMN AND MONONUCLEAR RARE SQUAMOUS EPITHELIAL CELLS PRESENT FEW GRAM POSITIVE COCCI IN CLUSTERS Performed at [facility]    Culture   Final    Culture reincubated for better growth Performed at [facility]    Report Status PENDING  Incomplete  Urine culture     Status: None   Collection Time: 06/07/15  2:54 AM  Result Value Ref Range Status   Specimen Description URINE, CLEAN CATCH  Final    Special Requests NONE  Final   Culture   Final    MULTIPLE SPECIES PRESENT, SUGGEST RECOLLECTION Performed at [facility]    Report Status 06/08/2015 FINAL  Final     Studies: Dg Chest 2 View  06/08/2015  CLINICAL DATA:  Hypoxia EXAM: CHEST  2 VIEW COMPARISON:  06/07/2015 FINDINGS: Right pacer remains in place, unchanged. There is hyperinflation of the lungs compatible with COPD. There is cardiomegaly. Diffuse interstitial opacities are again noted which likely reflects mild interstitial edema. There are small bilateral pleural effusions. Bibasilar airspace opacities are present, right greater than left. Cannot exclude superimposed pneumonia. IMPRESSION: Probable mild interstitial edema, similar to prior study. Bibasilar opacities, cannot exclude superimposed pneumonia. Small bilateral effusions. Electronically Signed   By: Rolm Baptise M.D.   On: 06/08/2015 09:58   Dg Chest Port 1 View  06/07/2015  CLINICAL DATA:  Cough and shortness of breath. EXAM: PORTABLE CHEST 1 VIEW COMPARISON:  07/17/2014 FINDINGS: Dual lead right-sided pacemaker remains in place. Cardiomegaly, progressed from prior exam. Diffuse interstitial opacities, consistent with pulmonary edema. Ill-defined opacity at the right lung base. The lungs are hyperinflated. Blunting of costophrenic angles, may be secondary to hyperinflation or small pleural effusions. No pneumothorax. The bones are under mineralized. IMPRESSION: Cardiomegaly and interstitial edema, findings most consistent with CHF. Patchy opacity at the right lung base, superimposed atelectasis, pneumonia, or less likely vascular structures. Electronically Signed   By: Jeb Levering M.D.   On: 06/07/2015 02:50    Scheduled Meds: . apixaban  2.5 mg Oral BID  . aspirin  324 mg Oral Once  . azithromycin  500 mg Intravenous Q24H  . cefTRIAXone (ROCEPHIN)  IV  1 g Intravenous Q24H  . cholecalciferol  1,000 Units Oral Daily  . furosemide  20 mg Intravenous Once   . guaiFENesin  600 mg Oral BID  . ipratropium-albuterol  3 mL Nebulization Q4H  . methylPREDNISolone (SOLU-MEDROL) injection  60 mg Intravenous Q12H  . metoprolol succinate  12.5 mg Oral QHS  . metoprolol succinate  25 mg Oral Daily  . mirtazapine  7.5 mg Oral QHS  . multivitamin with minerals  1 tablet Oral Daily   Continuous Infusions:  Antibiotics Given (last 72 hours)    Date/Time Action Medication Dose Rate   06/07/15 0632 Given   oseltamivir (TAMIFLU) capsule 75 mg 75 mg    06/07/15 [id number] Given   cefTRIAXone (ROCEPHIN) 1 g in dextrose 5 % 50 mL IVPB 1 g 100 mL/hr   06/07/15 0714 Given   azithromycin (ZITHROMAX) 500 mg in dextrose 5 % 250 mL IVPB 500 mg 250 mL/hr   06/08/15 0529 Given  cefTRIAXone (ROCEPHIN) 1 g in dextrose 5 % 50 mL IVPB 1 g 100 mL/hr   06/08/15 0616 Given   azithromycin (ZITHROMAX) 500 mg in dextrose 5 % 250 mL IVPB 500 mg 250 mL/hr      Principal Problem:   CAP (community acquired pneumonia) Active Problems:   Sepsis ([place])   Acute CHF (congestive heart failure) ([place])    Time spent: 1min    [facility] Pager [phone]. If 7PM-7AM, please contact night-coverage at [email or web address], password [facility] 06/08/2015, 1:54 PM  LOS: 1 day

## 2015-06-08 NOTE — Progress Notes (Signed)
Patient with diffuse wheezing and rhonchi through the night shift.  Has had shortness of breath and dyspnea with any exertion, especially getting out of bed to use the bathroom. Has productive cough the thick, greenish sputum.  NP on call was notified and we began patient on breathing treatments.  Got a one time dose of IV ativan to help with anxiety. Will try to obtain sputum specimen and continue to monitor patient.

## 2015-06-09 LAB — CULTURE, RESPIRATORY W GRAM STAIN: Culture: NORMAL

## 2015-06-09 LAB — BASIC METABOLIC PANEL
ANION GAP: 8 (ref 5–15)
BUN: 13 mg/dL (ref 6–20)
CALCIUM: 8.7 mg/dL — AB (ref 8.9–10.3)
CO2: 39 mmol/L — AB (ref 22–32)
Chloride: 97 mmol/L — ABNORMAL LOW (ref 101–111)
Creatinine, Ser: 0.51 mg/dL (ref 0.44–1.00)
GFR calc non Af Amer: >60 mL/min (ref >60)
Glucose, Bld: 154 mg/dL — ABNORMAL HIGH (ref 65–99)
POTASSIUM: 3.7 mmol/L (ref 3.5–5.1)
Sodium: 144 mmol/L (ref 135–145)

## 2015-06-09 LAB — CBC
HEMATOCRIT: 37.8 % (ref 36.0–46.0)
HEMOGLOBIN: 11.5 g/dL — AB (ref 12.0–15.0)
MCH: 29 pg (ref 26.0–34.0)
MCHC: 30.4 g/dL (ref 30.0–36.0)
MCV: 95.5 fL (ref 78.0–100.0)
Platelets: 254 10*3/uL (ref 150–400)
RBC: 3.96 MIL/uL (ref 3.87–5.11)
RDW: 14.9 % (ref 11.5–15.5)
WBC: 9.5 10*3/uL (ref 4.0–10.5)

## 2015-06-09 LAB — CULTURE, RESPIRATORY

## 2015-06-09 MED ORDER — POTASSIUM CHLORIDE CRYS ER 20 MEQ PO TBCR
40.0000 meq | EXTENDED_RELEASE_TABLET | Freq: Every day | ORAL | Status: DC
  Administered 2015-06-09 – 2015-06-10 (×2): 40 meq via ORAL
  Filled 2015-06-09 (×2): qty 2

## 2015-06-09 MED ORDER — METHYLPREDNISOLONE SODIUM SUCC 40 MG IJ SOLR
40.0000 mg | Freq: Two times a day (BID) | INTRAMUSCULAR | Status: AC
  Administered 2015-06-09 (×2): 40 mg via INTRAVENOUS
  Filled 2015-06-09 (×2): qty 1

## 2015-06-09 MED ORDER — PREDNISONE 50 MG PO TABS
50.0000 mg | ORAL_TABLET | Freq: Every day | ORAL | Status: DC
  Administered 2015-06-10: 50 mg via ORAL
  Filled 2015-06-09: qty 1

## 2015-06-09 MED ORDER — IPRATROPIUM-ALBUTEROL 0.5-2.5 (3) MG/3ML IN SOLN
3.0000 mL | Freq: Three times a day (TID) | RESPIRATORY_TRACT | Status: DC
  Administered 2015-06-10 (×2): 3 mL via RESPIRATORY_TRACT
  Filled 2015-06-09 (×2): qty 3

## 2015-06-09 MED ORDER — LEVOFLOXACIN 750 MG PO TABS
750.0000 mg | ORAL_TABLET | ORAL | Status: DC
  Administered 2015-06-09: 750 mg via ORAL
  Filled 2015-06-09: qty 1

## 2015-06-09 MED ORDER — FUROSEMIDE 20 MG PO TABS
20.0000 mg | ORAL_TABLET | Freq: Every day | ORAL | Status: DC
  Administered 2015-06-09 – 2015-06-10 (×2): 20 mg via ORAL
  Filled 2015-06-09 (×2): qty 1

## 2015-06-09 NOTE — Evaluation (Signed)
Physical Therapy Evaluation Patient Details Name: NELLIE ARBON MRN: [id number] DOB: 1930-08-12 Today's Date: 06/09/2015   History of Present Illness  81 yo female admitted with Pna. Hx of COPD, HTN, SSS, pacemaker, Afib, DM, syncope, orthostatic hypotension, scoliosis.   Clinical Impression  On eval, pt was Min guard assist for mobility-walked ~175 feet. O2 sats 91% on RA, dyspnea 2/4. Pt tolerated activity fairly well. Do not anticipate any follow up PT needs but pt would likely benefit from PRN supervision/assist.     Follow Up Recommendations No PT follow up;Supervision - Intermittent    Equipment Recommendations  None recommended by PT    Recommendations for Other Services       Precautions / Restrictions Precautions Precautions: Fall Restrictions Weight Bearing Restrictions: No      Mobility  Bed Mobility Overal bed mobility: Modified Independent                Transfers Overall transfer level: Needs assistance   Transfers: Sit to/from Stand Sit to Stand: Supervision         General transfer comment: for safety  Ambulation/Gait Ambulation/Gait assistance: Min guard Ambulation Distance (Feet): 175 Feet Assistive device: None Gait Pattern/deviations: Step-through pattern;Decreased stride length     General Gait Details: close guard for safety. O2 sats 91% on RA during ambulation. Dyspnea 2/4  Stairs            Wheelchair Mobility    Modified Rankin (Stroke Patients Only)       Balance Overall balance assessment: Needs assistance         Standing balance support: During functional activity Standing balance-Leahy Scale: Good                               Pertinent Vitals/Pain Pain Assessment: No/denies pain    Home Living Family/patient expects to be discharged to:: Private residence Living Arrangements: Alone Available Help at Discharge: Family;Available PRN/intermittently (daughter) Type of Home: House       Home Layout: One level Home Equipment: None      Prior Function Level of Independence: Independent               Hand Dominance        Extremity/Trunk Assessment   Upper Extremity Assessment: Overall WFL for tasks assessed           Lower Extremity Assessment: Generalized weakness      Cervical / Trunk Assessment: Kyphotic  Communication   Communication: No difficulties  Cognition Arousal/Alertness: Awake/alert Behavior During Therapy: WFL for tasks assessed/performed Overall Cognitive Status: Within Functional Limits for tasks assessed                      General Comments      Exercises        Assessment/Plan    PT Assessment Patient needs continued PT services  PT Diagnosis Generalized weakness   PT Problem List Decreased activity tolerance;Decreased mobility  PT Treatment Interventions Gait training;Functional mobility training;Therapeutic activities;Patient/family education;Therapeutic exercise   PT Goals (Current goals can be found in the Care Plan section) Acute Rehab PT Goals Patient Stated Goal: to travel to [place] for Christmas PT Goal Formulation: With patient Time For Goal Achievement: 06/23/15 Potential to Achieve Goals: Good    Frequency Min 3X/week   Barriers to discharge        Co-evaluation  End of Session   Activity Tolerance: Patient tolerated treatment well Patient left: in bed;with call bell/phone within reach;with bed alarm set           Time: 1500-1516 PT Time Calculation (min) (ACUTE ONLY): 16 min   Charges:   PT Evaluation $Initial PT Evaluation Tier I: 1 Procedure     PT G Codes:        Weston Anna, MPT Pager: [phone]

## 2015-06-09 NOTE — Progress Notes (Signed)
[facility] PROGRESS NOTE  [name] [id number] DOB: [birth year] DOA: 06/07/2015 PCP: Mathews Argyle, MD  Assessment/Plan: 1. CAP with COPD Exacerbation -clinically deteriorated a little 12/18 since then improving -cut down IV solumedrol, change to Prednisone tomorrow -Nebs, Abx-change to PO levaquin -Wean O2, needed O2 when went home last time  2. Elevated troponin -due to demand, improved -ECHo with normal EF and wall motion -ASA/Toprol  3. Mild Acute Diastolic CHF -continue Toprol, given IV lasix x1 12/18 and 12/19 -I/O not accurate, appears euvolemic now, Po lasix low dose 20mg   4. P.Afib -rate now improving, continue home regimen of Toprol and Apixaban  5. H/o PMR -continue prednisone at home dose  6. Moderate malnutrition -supplements as tolerated  Ambulate, PT eval  DVT proph: on apixaban  Code Status: Full COde Family Communication: daughter   Disposition Plan: home tomorrow if stable   HPI/Subjective: Breathing improving, cough, congestion better  Objective: Filed Vitals:   06/08/15 2315 06/09/15 0406  BP:  160/60  Pulse: 72 73  Temp:  97.8 F (36.6 C)  Resp: 18 22    Intake/Output Summary (Last 24 hours) at 06/09/15 0918 Last data filed at 06/09/15 0700  Gross per 24 hour  Intake    420 ml  Output   1800 ml  Net  -1380 ml   Filed Weights   06/07/15 0214 06/07/15 0601 06/07/15 1500  Weight: 53.071 kg (117 lb) 54.7 kg (120 lb 9.5 oz) 53.887 kg (118 lb 12.8 oz)    Exam:   General:  Frail, cachectic, chronically ill appearing  Cardiovascular: S1S2/RRR  Respiratory:improved air movement, no wheezing  Abdomen: soft, Nt, BS present  Musculoskeletal: No edema   Data Reviewed: Basic Metabolic Panel:  Recent Labs Lab 06/07/15 0200 06/08/15 0427 06/09/15 0407  NA 142 142 144  K 2.7* 3.5 3.7  CL 97* 99* 97*  CO2 33* 36* 39*  GLUCOSE 184* 117* 154*  BUN 16 15 13   CREATININE 0.75 0.58 0.51  CALCIUM 9.1 8.3*  8.7*   Liver Function Tests:  Recent Labs Lab 06/07/15 0200  AST 32  ALT 29  ALKPHOS 77  BILITOT 1.2  PROT 6.7  ALBUMIN 3.4*   No results for input(s): LIPASE, AMYLASE in the last 168 hours. No results for input(s): AMMONIA in the last 168 hours. CBC:  Recent Labs Lab 06/07/15 0200 06/08/15 0427 06/09/15 0407  WBC 17.2* 14.1* 9.5  NEUTROABS 14.5*  --   --   HGB 12.7 11.2* 11.5*  HCT 39.5 36.2 37.8  MCV 91.9 95.8 95.5  PLT 265 268 254   Cardiac Enzymes:  Recent Labs Lab 06/07/15 0200 06/07/15 0514  TROPONINI 0.28* 0.11*   BNP (last 3 results)  Recent Labs  06/07/15 0205  BNP 536.4*    ProBNP (last 3 results) No results for input(s): PROBNP in the last 8760 hours.  CBG: No results for input(s): GLUCAP in the last 168 hours.  Recent Results (from the past 240 hour(s))  Blood Culture (routine x 2)     Status: None (Preliminary result)   Collection Time: 06/07/15  2:12 AM  Result Value Ref Range Status   Specimen Description BLOOD RIGHT ANTECUBITAL  Final   Special Requests BOTTLES DRAWN AEROBIC AND ANAEROBIC 10ML  Final   Culture   Final    NO GROWTH 1 DAY Performed at [facility]    Report Status PENDING  Incomplete  Blood Culture (routine x 2)     Status: None (Preliminary  result)   Collection Time: 06/07/15  2:13 AM  Result Value Ref Range Status   Specimen Description BLOOD LEFT ANTECUBITAL  Final   Special Requests BOTTLES DRAWN AEROBIC AND ANAEROBIC 5ML  Final   Culture   Final    NO GROWTH 1 DAY Performed at [facility]    Report Status PENDING  Incomplete  Culture, respiratory (NON-Expectorated)     Status: None (Preliminary result)   Collection Time: 06/07/15  2:41 AM  Result Value Ref Range Status   Specimen Description SPU  Final   Special Requests NONE  Final   Gram Stain   Final    ABUNDANT WBC PRESENT,BOTH PMN AND MONONUCLEAR RARE SQUAMOUS EPITHELIAL CELLS PRESENT FEW GRAM POSITIVE COCCI IN CLUSTERS Performed  at [facility]    Culture   Final    Culture reincubated for better growth Performed at [facility]    Report Status PENDING  Incomplete  Urine culture     Status: None   Collection Time: 06/07/15  2:54 AM  Result Value Ref Range Status   Specimen Description URINE, CLEAN CATCH  Final   Special Requests NONE  Final   Culture   Final    MULTIPLE SPECIES PRESENT, SUGGEST RECOLLECTION Performed at [facility]    Report Status 06/08/2015 FINAL  Final  Culture, sputum-assessment     Status: None   Collection Time: 06/08/15 10:01 PM  Result Value Ref Range Status   Specimen Description SPUTUM  Final   Special Requests NONE  Final   Sputum evaluation   Final    THIS SPECIMEN IS ACCEPTABLE. RESPIRATORY CULTURE REPORT TO FOLLOW.   Report Status 06/08/2015 FINAL  Final     Studies: Dg Chest 2 View  06/08/2015  CLINICAL DATA:  Hypoxia EXAM: CHEST  2 VIEW COMPARISON:  06/07/2015 FINDINGS: Right pacer remains in place, unchanged. There is hyperinflation of the lungs compatible with COPD. There is cardiomegaly. Diffuse interstitial opacities are again noted which likely reflects mild interstitial edema. There are small bilateral pleural effusions. Bibasilar airspace opacities are present, right greater than left. Cannot exclude superimposed pneumonia. IMPRESSION: Probable mild interstitial edema, similar to prior study. Bibasilar opacities, cannot exclude superimposed pneumonia. Small bilateral effusions. Electronically Signed   By: Rolm Baptise M.D.   On: 06/08/2015 09:58    Scheduled Meds: . apixaban  2.5 mg Oral BID  . aspirin  324 mg Oral Once  . cholecalciferol  1,000 Units Oral Daily  . furosemide  20 mg Oral Daily  . guaiFENesin  600 mg Oral BID  . ipratropium-albuterol  3 mL Nebulization Q4H  . levofloxacin  500 mg Oral Daily  . methylPREDNISolone (SOLU-MEDROL) injection  40 mg Intravenous Q12H  . metoprolol succinate  12.5 mg Oral QHS  . metoprolol  succinate  25 mg Oral Daily  . mirtazapine  7.5 mg Oral QHS  . multivitamin with minerals  1 tablet Oral Daily  . potassium chloride  40 mEq Oral Daily  . [START ON 06/10/2015] predniSONE  50 mg Oral Q breakfast   Continuous Infusions:  Antibiotics Given (last 72 hours)    Date/Time Action Medication Dose Rate   06/07/15 0632 Given   oseltamivir (TAMIFLU) capsule 75 mg 75 mg    06/07/15 [id number] Given   cefTRIAXone (ROCEPHIN) 1 g in dextrose 5 % 50 mL IVPB 1 g 100 mL/hr   06/07/15 0714 Given   azithromycin (ZITHROMAX) 500 mg in dextrose 5 % 250 mL IVPB  500 mg 250 mL/hr   06/08/15 0529 Given   cefTRIAXone (ROCEPHIN) 1 g in dextrose 5 % 50 mL IVPB 1 g 100 mL/hr   06/08/15 0616 Given   azithromycin (ZITHROMAX) 500 mg in dextrose 5 % 250 mL IVPB 500 mg 250 mL/hr   06/09/15 [id number] Given   cefTRIAXone (ROCEPHIN) 1 g in dextrose 5 % 50 mL IVPB 1 g 100 mL/hr   06/09/15 0615 Given   azithromycin (ZITHROMAX) 500 mg in dextrose 5 % 250 mL IVPB 500 mg 250 mL/hr      Principal Problem:   CAP (community acquired pneumonia) Active Problems:   Sepsis ([place])   Acute CHF (congestive heart failure) ([place])    Time spent: 19min    [facility] Pager [phone]. If 7PM-7AM, please contact night-coverage at [email or web address], password [facility] 06/09/2015, 9:18 AM  LOS: 2 days

## 2015-06-10 DIAGNOSIS — J189 Pneumonia, unspecified organism: Secondary | ICD-10-CM

## 2015-06-10 LAB — CBC
HCT: 39.3 % (ref 36.0–46.0)
Hemoglobin: 11.9 g/dL — ABNORMAL LOW (ref 12.0–15.0)
MCH: 29.2 pg (ref 26.0–34.0)
MCHC: 30.3 g/dL (ref 30.0–36.0)
MCV: 96.3 fL (ref 78.0–100.0)
PLATELETS: 271 10*3/uL (ref 150–400)
RBC: 4.08 MIL/uL (ref 3.87–5.11)
RDW: 15.1 % (ref 11.5–15.5)
WBC: 9.8 10*3/uL (ref 4.0–10.5)

## 2015-06-10 LAB — BASIC METABOLIC PANEL
ANION GAP: 9 (ref 5–15)
BUN: 16 mg/dL (ref 6–20)
CALCIUM: 8.9 mg/dL (ref 8.9–10.3)
CO2: 38 mmol/L — ABNORMAL HIGH (ref 22–32)
Chloride: 97 mmol/L — ABNORMAL LOW (ref 101–111)
Creatinine, Ser: 0.72 mg/dL (ref 0.44–1.00)
GFR calc Af Amer: >60 mL/min (ref >60)
GLUCOSE: 153 mg/dL — AB (ref 65–99)
POTASSIUM: 4 mmol/L (ref 3.5–5.1)
SODIUM: 144 mmol/L (ref 135–145)

## 2015-06-10 MED ORDER — FUROSEMIDE 20 MG PO TABS: 20.0000 mg | ORAL_TABLET | ORAL | Status: DC

## 2015-06-10 MED ORDER — LEVOFLOXACIN 750 MG PO TABS: 750.0000 mg | ORAL_TABLET | ORAL | Status: DC

## 2015-06-10 NOTE — Progress Notes (Signed)
Pt ambulated in hall without oxygen and maintained O2 saturations greater than 94% K. Hardin Negus RN

## 2015-06-10 NOTE — Care Management Important Message (Signed)
Important Message  Patient Details  Name: [name] MRN: [id number] Date of Birth: 11/11/1930   Medicare Important Message Given:  Yes    Camillo Flaming 06/10/2015, 11:16 AMImportant Message  Patient Details  Name: [name] MRN: [id number] Date of Birth: 04/14/1931   Medicare Important Message Given:  Yes    Camillo Flaming 06/10/2015, 11:16 AM

## 2015-06-10 NOTE — Consult Note (Signed)
   [facility] CM Inpatient Consult   06/10/2015  [name] June 20, 1931 [id number]   EPIC referral received for [facility] Care Management services. Spoke to patient at bedside to offer [facility]. She pleasantly declined stating her daughter is very supportive and she does not need any additional services. She accepted [facility] Care Management brochure to call in future if needed. Made inpatient RNCM aware.  Marthenia Rolling, MSN-Ed, RN,BSN [facility] Liaison [phone]

## 2015-06-10 NOTE — Discharge Summary (Signed)
Physician Discharge Summary  [name] [id number] DOB: Sep 11, 1930 DOA: 06/07/2015  PCP: Mathews Argyle, MD  Admit date: 06/07/2015 Discharge date: 06/10/2015  Time spent: 35 minutes  Recommendations for Outpatient Follow-up:  1. Needs Bmet and cbc 1 week 2. Consider 2 vw CXR 1 mo 3. Complete levaquin12/25/16 4. Continue Lasix 20 mg every other day   Discharge Diagnoses:  Principal Problem:   CAP (community acquired pneumonia) Active Problems:   Sepsis ([place])   Acute CHF (congestive heart failure) ([place])   Discharge Condition: well  Diet recommendation: heart healthy  Filed Weights   06/07/15 0214 06/07/15 0601 06/07/15 1500  Weight: 53.071 kg (117 lb) 54.7 kg (120 lb 9.5 oz) 53.887 kg (118 lb 12.8 oz)    History of present illness:  39.?, Known history Orthostatic hypotension Aortic insufficiency Sinus node dysfunction + complete heart block + paroxysmal A. fib status post dual-chamber pacemaker originally 1993 s/p multiple replacements in June 2010 [low risk Myoview 2008] Hip fracture right side 01/13/11 Chronic venous insufficiency Chronic COPD  Admitted from ED 12/18 with SOB and productive cough x 3 days, found to have a PNA   Hospital Course:   1. CAP with COPD Exacerbation - IV solumedrol, --> Prednisone 20 mg daily which is usual home dose -Nebs, Abx-change to PO levaquin to complete 06/14/15 -Wean O2, ambulatory sats needed prior to d/c home  2. Elevated troponin -due to demand, improved -ECHo with  EF 5%%, grd 1 DD and wall motion nl -PA Peak pressure 41 mm HG -ASA/Toprol 25 am, 12.5 pm  3. Mild Acute Diastolic CHF -continue Toprol as above, given IV lasix x1 12/18 and 12/19 -I/O not accurate, appears euvolemic now, Po lasix low dose 20mg  - now -1.9 liters since admit  4. P.Afib -rate now improving, continue home regimen of Toprol and Apixaban  5. H/o PMR -continue prednisone at home dose  6. Moderate  malnutrition -supplements as tolerated  Ambulate, PT eval  DVT proph: on apixaban  Discharge Exam: Filed Vitals:   06/09/15 2217 06/10/15 0409  BP: 155/73 168/84  Pulse: 77 70  Temp: 98.6 F (37 C) 97.7 F (36.5 C)  Resp: 20 20    General: alert pleasant oriented in nad Cardiovascular:  s1 s2 no m/r/g Respiratory:  Clear no added sound  Discharge Instructions   Discharge Instructions    Diet - low sodium heart healthy    Complete by:  As directed      Discharge instructions    Complete by:  As directed   Complete your Levaquin after 4 more doses Please start every other day lasix for fluid build-up I would recommend a Chest Xray in 1 week to clarify if the [neumonia has cleared up I would also recommend blood work at [employer] MD office in 1-2 weeks time     Increase activity slowly    Complete by:  As directed           Current Discharge Medication List    START taking these medications   Details  furosemide (LASIX) 20 MG tablet Take 1 tablet (20 mg total) by mouth every other day. Qty: 30 tablet, Refills: 0    levofloxacin (LEVAQUIN) 750 MG tablet Take 1 tablet (750 mg total) by mouth every other day. Qty: 4 tablet, Refills: 0      CONTINUE these medications which have NOT CHANGED   Details  ALPRAZolam (XANAX) 0.25 MG tablet Take 0.25 mg by mouth at bedtime as needed for sleep.  apixaban (ELIQUIS) 5 MG TABS tablet Take 1 tablet (5 mg total) by mouth 2 (two) times daily. Qty: 84 tablet, Refills: 0    cholecalciferol (VITAMIN D) 1000 UNITS tablet Take 1,000 Units by mouth daily.    metoprolol succinate (TOPROL-XL) 25 MG 24 hr tablet Take 12.5-25 tablets by mouth 2 (two) times daily. Take 1 tablet in the morning and 1/2 tablet in the evening Refills: 3    mirtazapine (REMERON) 15 MG tablet Take 7.5 mg by mouth at bedtime. Take 1/2 tablet at night.    predniSONE (DELTASONE) 20 MG tablet Take 1 tablet by mouth daily. Refills: 2      STOP taking these  medications     Multiple Vitamin (MULTIVITAMIN) tablet        No Known Allergies    The results of significant diagnostics from this hospitalization (including imaging, microbiology, ancillary and laboratory) are listed below for reference.    Significant Diagnostic Studies: Dg Chest 2 View  06/08/2015  CLINICAL DATA:  Hypoxia EXAM: CHEST  2 VIEW COMPARISON:  06/07/2015 FINDINGS: Right pacer remains in place, unchanged. There is hyperinflation of the lungs compatible with COPD. There is cardiomegaly. Diffuse interstitial opacities are again noted which likely reflects mild interstitial edema. There are small bilateral pleural effusions. Bibasilar airspace opacities are present, right greater than left. Cannot exclude superimposed pneumonia. IMPRESSION: Probable mild interstitial edema, similar to prior study. Bibasilar opacities, cannot exclude superimposed pneumonia. Small bilateral effusions. Electronically Signed   By: Rolm Baptise M.D.   On: 06/08/2015 09:58   Dg Chest Port 1 View  06/07/2015  CLINICAL DATA:  Cough and shortness of breath. EXAM: PORTABLE CHEST 1 VIEW COMPARISON:  07/17/2014 FINDINGS: Dual lead right-sided pacemaker remains in place. Cardiomegaly, progressed from prior exam. Diffuse interstitial opacities, consistent with pulmonary edema. Ill-defined opacity at the right lung base. The lungs are hyperinflated. Blunting of costophrenic angles, may be secondary to hyperinflation or small pleural effusions. No pneumothorax. The bones are under mineralized. IMPRESSION: Cardiomegaly and interstitial edema, findings most consistent with CHF. Patchy opacity at the right lung base, superimposed atelectasis, pneumonia, or less likely vascular structures. Electronically Signed   By: Jeb Levering M.D.   On: 06/07/2015 02:50    Microbiology: Recent Results (from the past 240 hour(s))  Blood Culture (routine x 2)     Status: None (Preliminary result)   Collection Time: 06/07/15   2:12 AM  Result Value Ref Range Status   Specimen Description BLOOD RIGHT ANTECUBITAL  Final   Special Requests BOTTLES DRAWN AEROBIC AND ANAEROBIC 10ML  Final   Culture   Final    NO GROWTH 2 DAYS Performed at [facility]    Report Status PENDING  Incomplete  Blood Culture (routine x 2)     Status: None (Preliminary result)   Collection Time: 06/07/15  2:13 AM  Result Value Ref Range Status   Specimen Description BLOOD LEFT ANTECUBITAL  Final   Special Requests BOTTLES DRAWN AEROBIC AND ANAEROBIC 5ML  Final   Culture   Final    NO GROWTH 2 DAYS Performed at [facility]    Report Status PENDING  Incomplete  Culture, respiratory (NON-Expectorated)     Status: None   Collection Time: 06/07/15  2:41 AM  Result Value Ref Range Status   Specimen Description SPU  Final   Special Requests NONE  Final   Gram Stain   Final    ABUNDANT WBC PRESENT,BOTH PMN AND MONONUCLEAR RARE SQUAMOUS EPITHELIAL  CELLS PRESENT FEW GRAM POSITIVE COCCI IN CLUSTERS Performed at [facility]    Culture   Final    NORMAL OROPHARYNGEAL FLORA Performed at [facility]    Report Status 06/09/2015 FINAL  Final  Urine culture     Status: None   Collection Time: 06/07/15  2:54 AM  Result Value Ref Range Status   Specimen Description URINE, CLEAN CATCH  Final   Special Requests NONE  Final   Culture   Final    MULTIPLE SPECIES PRESENT, SUGGEST RECOLLECTION Performed at [facility]    Report Status 06/08/2015 FINAL  Final  Culture, sputum-assessment     Status: None   Collection Time: 06/08/15 10:01 PM  Result Value Ref Range Status   Specimen Description SPUTUM  Final   Special Requests NONE  Final   Sputum evaluation   Final    THIS SPECIMEN IS ACCEPTABLE. RESPIRATORY CULTURE REPORT TO FOLLOW.   Report Status 06/08/2015 FINAL  Final  Culture, respiratory (NON-Expectorated)     Status: None (Preliminary result)   Collection Time: 06/08/15 10:01 PM  Result Value  Ref Range Status   Specimen Description SPU  Final   Special Requests NONE  Final   Gram Stain   Final    NO WBC SEEN FEW SQUAMOUS EPITHELIAL CELLS PRESENT FEW GRAM POSITIVE COCCI IN PAIRS IN CLUSTERS FEW GRAM VARIABLE ROD RARE YEAST    Culture   Final    NORMAL OROPHARYNGEAL FLORA Performed at [facility]    Report Status PENDING  Incomplete     Labs: Basic Metabolic Panel:  Recent Labs Lab 06/07/15 0200 06/08/15 0427 06/09/15 0407 06/10/15 0450  NA 142 142 144 144  K 2.7* 3.5 3.7 4.0  CL 97* 99* 97* 97*  CO2 33* 36* 39* 38*  GLUCOSE 184* 117* 154* 153*  BUN 16 15 13 16   CREATININE 0.75 0.58 0.51 0.72  CALCIUM 9.1 8.3* 8.7* 8.9   Liver Function Tests:  Recent Labs Lab 06/07/15 0200  AST 32  ALT 29  ALKPHOS 77  BILITOT 1.2  PROT 6.7  ALBUMIN 3.4*   No results for input(s): LIPASE, AMYLASE in the last 168 hours. No results for input(s): AMMONIA in the last 168 hours. CBC:  Recent Labs Lab 06/07/15 0200 06/08/15 0427 06/09/15 0407 06/10/15 0450  WBC 17.2* 14.1* 9.5 9.8  NEUTROABS 14.5*  --   --   --   HGB 12.7 11.2* 11.5* 11.9*  HCT 39.5 36.2 37.8 39.3  MCV 91.9 95.8 95.5 96.3  PLT 265 268 254 271   Cardiac Enzymes:  Recent Labs Lab 06/07/15 0200 06/07/15 0514  TROPONINI 0.28* 0.11*   BNP: BNP (last 3 results)  Recent Labs  06/07/15 0205  BNP 536.4*    ProBNP (last 3 results) No results for input(s): PROBNP in the last 8760 hours.  CBG: No results for input(s): GLUCAP in the last 168 hours.     SignedNita Sells  [facility] 06/10/2015, 9:08 AM

## 2015-06-11 LAB — CULTURE, RESPIRATORY: GRAM STAIN: NONE SEEN

## 2015-06-11 LAB — CULTURE, RESPIRATORY W GRAM STAIN: Culture: NORMAL

## 2015-06-12 LAB — CULTURE, BLOOD (ROUTINE X 2)
CULTURE: NO GROWTH
Culture: NO GROWTH

## 2015-06-16 ENCOUNTER — Other Ambulatory Visit: Payer: Self-pay

## 2015-06-16 DIAGNOSIS — I509 Heart failure, unspecified: Secondary | ICD-10-CM

## 2015-06-16 NOTE — Patient Outreach (Signed)
[facility]) Care Management  06/16/2015  TORYN GUNDER [birth year] [id number]   Referral Date: 06/16/2015 Referral Source:  Emmi Pneumonia Program Referral Issue:  Red on Emmi Dashboard Day #1 - Know how/when to take meds? No   Outreach call #1 to patient.  Screening and Emmi Red Alert assessed.   Providers:   Primary MD:  Dr. Felipa Eth - next appt 07/06/14 Cardiologist: yes Insurance:  Medicare  Social: Patient lives in her home alone.  Mobility: States ambulates with no assistance.  Falls: none  Pain: none  Transportation:  Daughter  Caregiver: daughter   [facility] conditions:  CHF, HTN, COPD Admission 06/07/2015 - 06/10/2015 for management of CAP (community acquired pneumonia), Sepsis ([place]), Acute CHF (congestive heart failure) ([place])  CAP Patient confirms she has completed all her antibiotic and is taking Lasix as prescribed.  States she has no questions.  States feeling better with no shortness of breath but still has a little cough.  Verbalized understanding of follow-up appt and   Medications:  Less than 10 medications Co-pay cost issues: none Flu vaccine 03/16/2015  Consent: Patient agreed to [facility] services.   Plan: RN CM instructed patient to contact Primary MD for any worsening in condition, fever, shortness of breath.    [facility] RN CM Referral  -CHF, HTN, COPD -Transition of Care Services:  Admission 06/07/2015 - 06/10/2015 for management of CAP (community acquired pneumonia), Sepsis ([place]), Acute CHF (congestive heart failure) ([place])  RN CM notified [place] Management Assistant: agreed to services/case opened. RN CM advised in next contact call within 10 days.  RN CM advised to please notify MD of any changes in condition prior to scheduled appt's.   RN CM provided contact name and # [phone] or main office # [phone] and 24-hour nurse line # [phone].  RN CM confirmed patient is aware of 911 services for urgent emergency  needs.  Mariann Laster, RN, BSN, [facility], CCM  [facility] [employer] Management Coordinator [phone] Direct [phone] Cell [phone] Office [phone] Fax

## 2015-06-23 ENCOUNTER — Other Ambulatory Visit: Payer: Self-pay

## 2015-06-23 NOTE — Patient Outreach (Signed)
[facility]) Care Management  06/23/2015  TASHYRA CIPOLLONE 10/08/1930 [id number]   Patient triggered RED on EMMI Pneumonia dashboard, notification sent to Erenest Rasher, RN.  Thanks, [name]. [place], [place] Assistant Phone: [phone] Fax: [phone]

## 2015-06-23 NOTE — Patient Outreach (Signed)
This RNCM made contact with patient who identified herself using HIPPA identifiers giving date of birth and address This RNCM stated purpose of call and explained referral process.  Patient stated she did would see Dr. Felipa Eth next week, states he keep a pretty good eye on things. Patient refused [place] Coordination benefits at this time.   Plan: Discharge patient from caseload Notify primary care physician of patient refusal

## 2015-06-25 DIAGNOSIS — Z79899 Other long term (current) drug therapy: Secondary | ICD-10-CM | POA: Diagnosis not present

## 2015-07-02 ENCOUNTER — Telehealth: Payer: Self-pay | Admitting: Cardiovascular Disease

## 2015-07-02 NOTE — Telephone Encounter (Signed)
She was told to let you know prior authorization is coming for her Eliquis.

## 2015-07-03 NOTE — Telephone Encounter (Signed)
Paperwork sent to [facility] for PA for Eliquis.

## 2015-07-06 ENCOUNTER — Telehealth: Payer: Self-pay | Admitting: *Deleted

## 2015-07-06 NOTE — Telephone Encounter (Signed)
PA faxed for Eliquis to [facility].

## 2015-07-08 ENCOUNTER — Other Ambulatory Visit: Payer: Self-pay | Admitting: Geriatric Medicine

## 2015-07-08 ENCOUNTER — Ambulatory Visit
Admission: RE | Admit: 2015-07-08 | Discharge: 2015-07-08 | Disposition: A | Discharge status: Home / Self Care | Payer: Medicare Other | Source: Ambulatory Visit | Attending: Geriatric Medicine | Admitting: Geriatric Medicine

## 2015-07-08 DIAGNOSIS — E7800 Pure hypercholesterolemia, unspecified: Secondary | ICD-10-CM | POA: Diagnosis not present

## 2015-07-08 DIAGNOSIS — J690 Pneumonitis due to inhalation of food and vomit: Secondary | ICD-10-CM

## 2015-07-08 DIAGNOSIS — I10 Essential (primary) hypertension: Secondary | ICD-10-CM | POA: Diagnosis not present

## 2015-07-08 DIAGNOSIS — Z0000 Encounter for general adult medical examination without abnormal findings: Secondary | ICD-10-CM | POA: Diagnosis not present

## 2015-07-08 DIAGNOSIS — I5030 Unspecified diastolic (congestive) heart failure: Secondary | ICD-10-CM | POA: Diagnosis not present

## 2015-07-08 DIAGNOSIS — J438 Other emphysema: Secondary | ICD-10-CM | POA: Diagnosis not present

## 2015-07-08 DIAGNOSIS — Z780 Asymptomatic menopausal state: Secondary | ICD-10-CM | POA: Diagnosis not present

## 2015-07-08 DIAGNOSIS — I480 Paroxysmal atrial fibrillation: Secondary | ICD-10-CM | POA: Diagnosis not present

## 2015-07-08 DIAGNOSIS — I700 Atherosclerosis of aorta: Secondary | ICD-10-CM | POA: Diagnosis not present

## 2015-07-08 DIAGNOSIS — F329 Major depressive disorder, single episode, unspecified: Secondary | ICD-10-CM | POA: Diagnosis not present

## 2015-07-08 DIAGNOSIS — J189 Pneumonia, unspecified organism: Secondary | ICD-10-CM | POA: Diagnosis not present

## 2015-07-09 ENCOUNTER — Telehealth: Payer: Self-pay | Admitting: Cardiovascular Disease

## 2015-07-09 NOTE — Telephone Encounter (Signed)
Expand All Collapse All   Pt's daughter called in stating that a prior authorization is needed for the pt's Eliquis medication. She was told that it needed a Doctor's approval

## 2015-07-09 NOTE — Telephone Encounter (Signed)
Pt's daughter called in stating that a prior authorization is needed for the pt's Eliquis medication. She was told that it needed a Doctor's approval. Please f/u with her  Thanks

## 2015-07-10 DIAGNOSIS — Z85828 Personal history of other malignant neoplasm of skin: Secondary | ICD-10-CM | POA: Diagnosis not present

## 2015-07-10 DIAGNOSIS — Z85820 Personal history of malignant melanoma of skin: Secondary | ICD-10-CM | POA: Diagnosis not present

## 2015-07-10 DIAGNOSIS — Z1283 Encounter for screening for malignant neoplasm of skin: Secondary | ICD-10-CM | POA: Diagnosis not present

## 2015-07-10 DIAGNOSIS — Z08 Encounter for follow-up examination after completed treatment for malignant neoplasm: Secondary | ICD-10-CM | POA: Diagnosis not present

## 2015-07-10 DIAGNOSIS — L821 Other seborrheic keratosis: Secondary | ICD-10-CM | POA: Diagnosis not present

## 2015-07-13 NOTE — Telephone Encounter (Signed)
Pt's daughter called in stating that her Eliquis prescription was denied because the prescription required a different diagnosis code. Please f/u with the appeals department number is [phone]. Please f/u   Thanks

## 2015-07-13 NOTE — Telephone Encounter (Signed)
Appeal has been done through [facility].  Awaiting response.

## 2015-07-16 NOTE — Telephone Encounter (Signed)
We do not have any Eliquis samples in stock either.

## 2015-07-16 NOTE — Telephone Encounter (Signed)
Pt's daughter called in stating that [facility] has not received the Appeal to the prior authorization and right now the pt is out of medication. She would like to know if there are any samples the pt could have to hold her over. Please f/u with her  Thanks

## 2015-07-17 ENCOUNTER — Other Ambulatory Visit: Payer: Self-pay | Admitting: Cardiovascular Disease

## 2015-07-17 MED ORDER — APIXABAN 5 MG PO TABS: 5.0000 mg | ORAL_TABLET | Freq: Two times a day (BID) | ORAL | Status: DC

## 2015-07-17 NOTE — Telephone Encounter (Signed)
PA approved for Eliquis through 06/19/16.  Pharmacy and patient notified.

## 2015-07-18 ENCOUNTER — Other Ambulatory Visit: Payer: Self-pay | Admitting: Cardiovascular Disease

## 2015-07-20 ENCOUNTER — Other Ambulatory Visit: Payer: Self-pay

## 2015-07-20 MED ORDER — APIXABAN 5 MG PO TABS
5.0000 mg | ORAL_TABLET | Freq: Two times a day (BID) | ORAL | Status: DC
Start: 2015-07-20 — End: 2015-08-29

## 2015-08-06 ENCOUNTER — Ambulatory Visit (INDEPENDENT_AMBULATORY_CARE_PROVIDER_SITE_OTHER): Payer: Medicare Other | Admitting: *Deleted

## 2015-08-06 ENCOUNTER — Telehealth: Payer: Self-pay | Admitting: Cardiology

## 2015-08-06 DIAGNOSIS — I495 Sick sinus syndrome: Secondary | ICD-10-CM | POA: Diagnosis not present

## 2015-08-06 NOTE — Telephone Encounter (Signed)
LMOVM reminding pt to send remote transmission.   

## 2015-08-07 NOTE — Progress Notes (Signed)
Remote pacemaker transmission.   

## 2015-08-12 DIAGNOSIS — M810 Age-related osteoporosis without current pathological fracture: Secondary | ICD-10-CM | POA: Diagnosis not present

## 2015-08-12 DIAGNOSIS — I10 Essential (primary) hypertension: Secondary | ICD-10-CM | POA: Diagnosis not present

## 2015-08-12 DIAGNOSIS — J449 Chronic obstructive pulmonary disease, unspecified: Secondary | ICD-10-CM | POA: Diagnosis not present

## 2015-08-12 DIAGNOSIS — E441 Mild protein-calorie malnutrition: Secondary | ICD-10-CM | POA: Diagnosis not present

## 2015-08-12 DIAGNOSIS — M353 Polymyalgia rheumatica: Secondary | ICD-10-CM | POA: Diagnosis not present

## 2015-08-12 DIAGNOSIS — I480 Paroxysmal atrial fibrillation: Secondary | ICD-10-CM | POA: Diagnosis not present

## 2015-08-12 DIAGNOSIS — I5030 Unspecified diastolic (congestive) heart failure: Secondary | ICD-10-CM | POA: Diagnosis not present

## 2015-08-12 DIAGNOSIS — Z79899 Other long term (current) drug therapy: Secondary | ICD-10-CM | POA: Diagnosis not present

## 2015-08-18 DIAGNOSIS — M810 Age-related osteoporosis without current pathological fracture: Secondary | ICD-10-CM | POA: Diagnosis not present

## 2015-08-18 DIAGNOSIS — I10 Essential (primary) hypertension: Secondary | ICD-10-CM | POA: Diagnosis not present

## 2015-08-18 DIAGNOSIS — I480 Paroxysmal atrial fibrillation: Secondary | ICD-10-CM | POA: Diagnosis not present

## 2015-08-25 ENCOUNTER — Emergency Department (HOSPITAL_COMMUNITY): Payer: Medicare Other

## 2015-08-25 ENCOUNTER — Inpatient Hospital Stay (HOSPITAL_COMMUNITY)
Admission: EM | Admit: 2015-08-25 | Discharge: 2015-08-29 | DRG: 190 | Disposition: A | Discharge status: Home Health | Payer: Medicare Other | Attending: Internal Medicine | Admitting: Internal Medicine

## 2015-08-25 ENCOUNTER — Encounter (HOSPITAL_COMMUNITY): Payer: Self-pay | Admitting: Emergency Medicine

## 2015-08-25 DIAGNOSIS — I442 Atrioventricular block, complete: Secondary | ICD-10-CM | POA: Diagnosis present

## 2015-08-25 DIAGNOSIS — T502X5A Adverse effect of carbonic-anhydrase inhibitors, benzothiadiazides and other diuretics, initial encounter: Secondary | ICD-10-CM | POA: Diagnosis present

## 2015-08-25 DIAGNOSIS — Z7901 Long term (current) use of anticoagulants: Secondary | ICD-10-CM

## 2015-08-25 DIAGNOSIS — I10 Essential (primary) hypertension: Secondary | ICD-10-CM | POA: Diagnosis present

## 2015-08-25 DIAGNOSIS — J441 Chronic obstructive pulmonary disease with (acute) exacerbation: Principal | ICD-10-CM | POA: Diagnosis present

## 2015-08-25 DIAGNOSIS — F329 Major depressive disorder, single episode, unspecified: Secondary | ICD-10-CM | POA: Diagnosis present

## 2015-08-25 DIAGNOSIS — J9601 Acute respiratory failure with hypoxia: Secondary | ICD-10-CM | POA: Diagnosis not present

## 2015-08-25 DIAGNOSIS — E2740 Unspecified adrenocortical insufficiency: Secondary | ICD-10-CM | POA: Diagnosis not present

## 2015-08-25 DIAGNOSIS — M3313 Other dermatomyositis without myopathy: Secondary | ICD-10-CM | POA: Diagnosis present

## 2015-08-25 DIAGNOSIS — I951 Orthostatic hypotension: Secondary | ICD-10-CM | POA: Diagnosis present

## 2015-08-25 DIAGNOSIS — E876 Hypokalemia: Secondary | ICD-10-CM | POA: Diagnosis present

## 2015-08-25 DIAGNOSIS — R0602 Shortness of breath: Secondary | ICD-10-CM | POA: Diagnosis not present

## 2015-08-25 DIAGNOSIS — I5030 Unspecified diastolic (congestive) heart failure: Secondary | ICD-10-CM | POA: Diagnosis present

## 2015-08-25 DIAGNOSIS — M419 Scoliosis, unspecified: Secondary | ICD-10-CM | POA: Diagnosis present

## 2015-08-25 DIAGNOSIS — M3390 Dermatopolymyositis, unspecified, organ involvement unspecified: Secondary | ICD-10-CM | POA: Diagnosis present

## 2015-08-25 DIAGNOSIS — J449 Chronic obstructive pulmonary disease, unspecified: Secondary | ICD-10-CM | POA: Diagnosis not present

## 2015-08-25 DIAGNOSIS — J9621 Acute and chronic respiratory failure with hypoxia: Secondary | ICD-10-CM | POA: Diagnosis not present

## 2015-08-25 DIAGNOSIS — I495 Sick sinus syndrome: Secondary | ICD-10-CM | POA: Diagnosis not present

## 2015-08-25 DIAGNOSIS — Z79899 Other long term (current) drug therapy: Secondary | ICD-10-CM

## 2015-08-25 DIAGNOSIS — F419 Anxiety disorder, unspecified: Secondary | ICD-10-CM | POA: Diagnosis present

## 2015-08-25 DIAGNOSIS — I480 Paroxysmal atrial fibrillation: Secondary | ICD-10-CM | POA: Diagnosis present

## 2015-08-25 DIAGNOSIS — I5033 Acute on chronic diastolic (congestive) heart failure: Secondary | ICD-10-CM | POA: Diagnosis not present

## 2015-08-25 DIAGNOSIS — F411 Generalized anxiety disorder: Secondary | ICD-10-CM | POA: Insufficient documentation

## 2015-08-25 DIAGNOSIS — Z7952 Long term (current) use of systemic steroids: Secondary | ICD-10-CM

## 2015-08-25 DIAGNOSIS — I083 Combined rheumatic disorders of mitral, aortic and tricuspid valves: Secondary | ICD-10-CM | POA: Diagnosis present

## 2015-08-25 DIAGNOSIS — Z950 Presence of cardiac pacemaker: Secondary | ICD-10-CM | POA: Diagnosis present

## 2015-08-25 DIAGNOSIS — R069 Unspecified abnormalities of breathing: Secondary | ICD-10-CM | POA: Diagnosis not present

## 2015-08-25 DIAGNOSIS — R062 Wheezing: Secondary | ICD-10-CM | POA: Diagnosis not present

## 2015-08-25 HISTORY — DX: Heart failure, unspecified: I50.9

## 2015-08-25 LAB — BRAIN NATRIURETIC PEPTIDE: B Natriuretic Peptide: 774.8 pg/mL — ABNORMAL HIGH (ref 0.0–100.0)

## 2015-08-25 MED ORDER — ALBUTEROL SULFATE (2.5 MG/3ML) 0.083% IN NEBU
5.0000 mg | INHALATION_SOLUTION | Freq: Once | RESPIRATORY_TRACT | Status: AC
  Administered 2015-08-25: 5 mg via RESPIRATORY_TRACT
  Filled 2015-08-25: qty 6

## 2015-08-25 MED ORDER — SODIUM CHLORIDE 0.9 % IV SOLN
INTRAVENOUS | Status: DC
  Administered 2015-08-25: 10 mL/h via INTRAVENOUS

## 2015-08-25 NOTE — ED Notes (Addendum)
RT at bedside.

## 2015-08-25 NOTE — ED Notes (Signed)
Pt transported from home with c/o respiratory distress x 2 hours. 90% on RA, albuterol neb given, Solumedrol 125mg  iv given. Pt now 100 on Baxter 2L.

## 2015-08-25 NOTE — ED Provider Notes (Signed)
CSN: [id number]     Arrival date & time 08/25/15  2151 History   First MD Initiated Contact with Patient 08/25/15 2202     Chief Complaint  Patient presents with  . Respiratory Distress     (Consider location/radiation/quality/duration/timing/severity/associated sxs/prior Treatment) HPI Comments: Patient here complaining of increased work of breathing 2 hours. Has a history of CHF as well as COPD and has audible wheezing. Denies any chest pain or chest pressure. Denies any orthopnea but does have some dyspnea on exertion. Did have URI symptoms that began yesterday evening. No vomiting or diarrhea. Called EMS and patient had pulse oximetry of 90% on room air. Was given albuterol as well as Solu-Medrol and patient's breathing has improved.  The history is provided by the patient.    Past Medical History  Diagnosis Date  . Syncope   . COPD (chronic obstructive pulmonary disease) ([place])   . DM (dermatomyositis)   . Orthostatic hypotension   . Systemic hypertension   . Thoracic kyphosis   . Scoliosis   . Pacemaker 07/13/2013    Her original pacemaker and the current leads were implanted in 1992. She has had 2 generator change out, most recently in 2010. Her device is a Buyer, retail 2110 non RF dual-chamber pacemaker with a battery longevity estimated at about 7 years. The atrial lead is a St. Jude [id number] and the ventricular lead was a Biotronik [id number].   [name] CHB (complete heart block) ([place]) 07/13/2013    Pacemaker dependent  . SSS (sick sinus syndrome) ([place]) 07/13/2013  . Paroxysmal atrial fibrillation ([place]) 09/04/2014   Past Surgical History  Procedure Laterality Date  . Permanent pacemaker generator change  03/27/2009    St.Jude  . Nm myocar perf wall motion  09/13/2011    Low risk  . US echocardiography  03/28/2012    Mod LAE,mild MR,aortic sclerosis w/mod AI,mod. TR,mild PI,Stage I diastolic dysfunction   No family history on file. Social History  Substance Use Topics  . Smoking  status: Never Smoker   . Smokeless tobacco: None  . Alcohol Use: No   OB History    No data available     Review of Systems  All other systems reviewed and are negative.     Allergies  Review of patient's allergies indicates no known allergies.  Home Medications   Prior to Admission medications   Medication Sig Start Date End Date Taking? Authorizing Provider  ALPRAZolam Duanne Moron) 0.25 MG tablet Take 0.25 mg by mouth at bedtime as needed for sleep.    Historical Provider, MD  apixaban (ELIQUIS) 5 MG TABS tablet Take 1 tablet (5 mg total) by mouth 2 (two) times daily. 07/20/15   Mihai Croitoru, MD  cholecalciferol (VITAMIN D) 1000 UNITS tablet Take 1,000 Units by mouth daily.    Historical Provider, MD  furosemide (LASIX) 20 MG tablet Take 1 tablet (20 mg total) by mouth every other day. 06/10/15   Nita Sells, MD  levofloxacin (LEVAQUIN) 750 MG tablet Take 1 tablet (750 mg total) by mouth every other day. 06/10/15   Nita Sells, MD  metoprolol succinate (TOPROL-XL) 25 MG 24 hr tablet Take 12.5-25 tablets by mouth 2 (two) times daily. Take 1 tablet in the morning and 1/2 tablet in the evening 05/15/15   Historical Provider, MD  mirtazapine (REMERON) 15 MG tablet Take 7.5 mg by mouth at bedtime. Take 1/2 tablet at night.    Historical Provider, MD  predniSONE (DELTASONE) 20 MG tablet Take 1 tablet  by mouth daily. 05/12/15   Historical Provider, MD   BP 140/77 mmHg  Pulse 73  Temp(Src) 98.2 F (36.8 C) (Oral)  Resp 22  Ht 5\' 5"  (1.651 m)  Wt 52.164 kg  BMI 19.14 kg/m2  SpO2 87% Physical Exam  Constitutional: She is oriented to person, place, and time. She appears well-developed and well-nourished.  Non-toxic appearance. No distress.  HENT:  Head: Normocephalic and atraumatic.  Eyes: Conjunctivae, EOM and lids are normal. Pupils are equal, round, and reactive to light.  Neck: Normal range of motion. Neck supple. No tracheal deviation present. No thyroid mass  present.  Cardiovascular: Normal rate, regular rhythm and normal heart sounds.  Exam reveals no gallop.   No murmur heard. Pulmonary/Chest: Effort normal. No stridor. No respiratory distress. She has decreased breath sounds. She has wheezes. She has no rhonchi. She has no rales.  Abdominal: Soft. Normal appearance and bowel sounds are normal. She exhibits no distension. There is no tenderness. There is no rebound and no CVA tenderness.  Musculoskeletal: Normal range of motion. She exhibits no edema or tenderness.  Neurological: She is alert and oriented to person, place, and time. She has normal strength. No cranial nerve deficit or sensory deficit. GCS eye subscore is 4. GCS verbal subscore is 5. GCS motor subscore is 6.  Skin: Skin is warm and dry. No abrasion and no rash noted.  Psychiatric: She has a normal mood and affect. Her speech is normal and behavior is normal.  Nursing note and vitals reviewed.   ED Course  Procedures (including critical care time) Labs Review Labs Reviewed  BASIC METABOLIC PANEL  CBC  TROPONIN I  BRAIN NATRIURETIC PEPTIDE    Imaging Review No results found. I have personally reviewed and evaluated these images and lab results as part of my medical decision-making.   EKG Interpretation   Date/Time:  Tuesday August 25 2015 22:11:15 EST Ventricular Rate:  77 PR Interval:  163 QRS Duration: 174 QT Interval:  433 QTC Calculation: 490 R Axis:   -63 Text Interpretation:  Atrial-ventricular dual-paced complexes No further  analysis attempted due to paced rhythm No significant change since last  tracing Confirmed by Ki Corbo  MD, Da Authement ([id number]) on 08/25/2015 10:45:34 PM      MDM   Final diagnoses:  None    Patient given albuterol and distal better. Will be given Lasix. Labs are pending at this time and patient will be admitted for COPD versus CHF. Care signed out to Dr. Renford Dills, MD 08/26/15 [phone]

## 2015-08-26 ENCOUNTER — Encounter (HOSPITAL_COMMUNITY): Payer: Self-pay | Admitting: Internal Medicine

## 2015-08-26 ENCOUNTER — Inpatient Hospital Stay (HOSPITAL_COMMUNITY): Payer: Medicare Other

## 2015-08-26 DIAGNOSIS — I10 Essential (primary) hypertension: Secondary | ICD-10-CM | POA: Diagnosis present

## 2015-08-26 DIAGNOSIS — I480 Paroxysmal atrial fibrillation: Secondary | ICD-10-CM | POA: Diagnosis present

## 2015-08-26 DIAGNOSIS — I951 Orthostatic hypotension: Secondary | ICD-10-CM | POA: Diagnosis present

## 2015-08-26 DIAGNOSIS — E2740 Unspecified adrenocortical insufficiency: Secondary | ICD-10-CM | POA: Diagnosis present

## 2015-08-26 DIAGNOSIS — I509 Heart failure, unspecified: Secondary | ICD-10-CM | POA: Diagnosis not present

## 2015-08-26 DIAGNOSIS — I5030 Unspecified diastolic (congestive) heart failure: Secondary | ICD-10-CM | POA: Diagnosis present

## 2015-08-26 DIAGNOSIS — Z7952 Long term (current) use of systemic steroids: Secondary | ICD-10-CM | POA: Diagnosis not present

## 2015-08-26 DIAGNOSIS — M419 Scoliosis, unspecified: Secondary | ICD-10-CM | POA: Diagnosis present

## 2015-08-26 DIAGNOSIS — I495 Sick sinus syndrome: Secondary | ICD-10-CM | POA: Diagnosis present

## 2015-08-26 DIAGNOSIS — J441 Chronic obstructive pulmonary disease with (acute) exacerbation: Secondary | ICD-10-CM | POA: Diagnosis present

## 2015-08-26 DIAGNOSIS — I5033 Acute on chronic diastolic (congestive) heart failure: Secondary | ICD-10-CM | POA: Insufficient documentation

## 2015-08-26 DIAGNOSIS — Z7901 Long term (current) use of anticoagulants: Secondary | ICD-10-CM | POA: Diagnosis not present

## 2015-08-26 DIAGNOSIS — J9601 Acute respiratory failure with hypoxia: Secondary | ICD-10-CM | POA: Diagnosis not present

## 2015-08-26 DIAGNOSIS — R0602 Shortness of breath: Secondary | ICD-10-CM | POA: Diagnosis not present

## 2015-08-26 DIAGNOSIS — E876 Hypokalemia: Secondary | ICD-10-CM | POA: Diagnosis present

## 2015-08-26 DIAGNOSIS — I083 Combined rheumatic disorders of mitral, aortic and tricuspid valves: Secondary | ICD-10-CM | POA: Diagnosis present

## 2015-08-26 DIAGNOSIS — M3390 Dermatopolymyositis, unspecified, organ involvement unspecified: Secondary | ICD-10-CM | POA: Diagnosis present

## 2015-08-26 DIAGNOSIS — Z950 Presence of cardiac pacemaker: Secondary | ICD-10-CM | POA: Diagnosis not present

## 2015-08-26 DIAGNOSIS — Z79899 Other long term (current) drug therapy: Secondary | ICD-10-CM | POA: Diagnosis not present

## 2015-08-26 DIAGNOSIS — T502X5A Adverse effect of carbonic-anhydrase inhibitors, benzothiadiazides and other diuretics, initial encounter: Secondary | ICD-10-CM | POA: Diagnosis present

## 2015-08-26 DIAGNOSIS — I5032 Chronic diastolic (congestive) heart failure: Secondary | ICD-10-CM | POA: Diagnosis not present

## 2015-08-26 DIAGNOSIS — M3313 Other dermatomyositis without myopathy: Secondary | ICD-10-CM | POA: Diagnosis present

## 2015-08-26 DIAGNOSIS — F419 Anxiety disorder, unspecified: Secondary | ICD-10-CM | POA: Diagnosis present

## 2015-08-26 DIAGNOSIS — I442 Atrioventricular block, complete: Secondary | ICD-10-CM | POA: Diagnosis present

## 2015-08-26 DIAGNOSIS — F329 Major depressive disorder, single episode, unspecified: Secondary | ICD-10-CM | POA: Diagnosis present

## 2015-08-26 LAB — BASIC METABOLIC PANEL
Anion gap: 10 (ref 5–15)
Anion gap: 15 (ref 5–15)
BUN: 16 mg/dL (ref 6–20)
BUN: 15 mg/dL (ref 6–20)
CALCIUM: 8.3 mg/dL — AB (ref 8.9–10.3)
CHLORIDE: 98 mmol/L — AB (ref 101–111)
CHLORIDE: 97 mmol/L — AB (ref 101–111)
CO2: 34 mmol/L — ABNORMAL HIGH (ref 22–32)
CO2: 30 mmol/L (ref 22–32)
CREATININE: 0.82 mg/dL (ref 0.44–1.00)
CREATININE: 0.8 mg/dL (ref 0.44–1.00)
Calcium: 8.7 mg/dL — ABNORMAL LOW (ref 8.9–10.3)
GFR calc Af Amer: >60 mL/min (ref >60)
GFR calc non Af Amer: >60 mL/min (ref >60)
GLUCOSE: 138 mg/dL — AB (ref 65–99)
Glucose, Bld: 168 mg/dL — ABNORMAL HIGH (ref 65–99)
Potassium: 3.2 mmol/L — ABNORMAL LOW (ref 3.5–5.1)
Potassium: 3.1 mmol/L — ABNORMAL LOW (ref 3.5–5.1)
SODIUM: 142 mmol/L (ref 135–145)
SODIUM: 142 mmol/L (ref 135–145)

## 2015-08-26 LAB — CBC
HCT: 37.2 % (ref 36.0–46.0)
HCT: 40.2 % (ref 36.0–46.0)
Hemoglobin: 11.9 g/dL — ABNORMAL LOW (ref 12.0–15.0)
Hemoglobin: 13.2 g/dL (ref 12.0–15.0)
MCH: 29.1 pg (ref 26.0–34.0)
MCH: 29.3 pg (ref 26.0–34.0)
MCHC: 32 g/dL (ref 30.0–36.0)
MCHC: 32.8 g/dL (ref 30.0–36.0)
MCV: 91 fL (ref 78.0–100.0)
MCV: 89.1 fL (ref 78.0–100.0)
PLATELETS: 254 10*3/uL (ref 150–400)
Platelets: 281 10*3/uL (ref 150–400)
RBC: 4.09 MIL/uL (ref 3.87–5.11)
RBC: 4.51 MIL/uL (ref 3.87–5.11)
RDW: 14.8 % (ref 11.5–15.5)
RDW: 15.5 % (ref 11.5–15.5)
WBC: 12.2 10*3/uL — AB (ref 4.0–10.5)
WBC: 12.2 10*3/uL — AB (ref 4.0–10.5)

## 2015-08-26 LAB — ECHOCARDIOGRAM COMPLETE
HEIGHTINCHES: 65 in
WEIGHTICAEL: 1809.6 [oz_av]

## 2015-08-26 LAB — TROPONIN I: Troponin I: 0.03 ng/mL (ref <0.031)

## 2015-08-26 MED ORDER — ESCITALOPRAM OXALATE 10 MG PO TABS
10.0000 mg | ORAL_TABLET | Freq: Every day | ORAL | Status: DC
  Administered 2015-08-26 – 2015-08-29 (×4): 10 mg via ORAL
  Filled 2015-08-26 (×4): qty 1

## 2015-08-26 MED ORDER — SODIUM CHLORIDE 0.9% FLUSH: 3.0000 mL | INTRAVENOUS | Status: DC | PRN

## 2015-08-26 MED ORDER — OXYCODONE HCL 5 MG PO TABS: 5.0000 mg | ORAL_TABLET | ORAL | Status: DC | PRN

## 2015-08-26 MED ORDER — SODIUM CHLORIDE 0.9 % IV SOLN: 250.0000 mL | INTRAVENOUS | Status: DC | PRN

## 2015-08-26 MED ORDER — IPRATROPIUM-ALBUTEROL 0.5-2.5 (3) MG/3ML IN SOLN
3.0000 mL | Freq: Four times a day (QID) | RESPIRATORY_TRACT | Status: DC
  Administered 2015-08-26 – 2015-08-27 (×5): 3 mL via RESPIRATORY_TRACT
  Filled 2015-08-26 (×6): qty 3

## 2015-08-26 MED ORDER — ONDANSETRON HCL 4 MG/2ML IJ SOLN
4.0000 mg | Freq: Four times a day (QID) | INTRAMUSCULAR | Status: DC | PRN
  Administered 2015-08-27: 4 mg via INTRAVENOUS
  Filled 2015-08-26: qty 2

## 2015-08-26 MED ORDER — METOPROLOL SUCCINATE ER 25 MG PO TB24
12.5000 mg | ORAL_TABLET | Freq: Every day | ORAL | Status: DC
  Administered 2015-08-26 – 2015-08-28 (×3): 12.5 mg via ORAL
  Filled 2015-08-26 (×3): qty 1

## 2015-08-26 MED ORDER — METOPROLOL SUCCINATE ER 25 MG PO TB24
25.0000 mg | ORAL_TABLET | Freq: Every day | ORAL | Status: DC
  Administered 2015-08-26 – 2015-08-29 (×4): 25 mg via ORAL
  Filled 2015-08-26 (×4): qty 1

## 2015-08-26 MED ORDER — SODIUM CHLORIDE 0.9% FLUSH: 3.0000 mL | Freq: Two times a day (BID) | INTRAVENOUS | Status: DC

## 2015-08-26 MED ORDER — POTASSIUM CHLORIDE CRYS ER 20 MEQ PO TBCR
20.0000 meq | EXTENDED_RELEASE_TABLET | Freq: Two times a day (BID) | ORAL | Status: AC
  Administered 2015-08-26 – 2015-08-27 (×4): 20 meq via ORAL
  Filled 2015-08-26 (×4): qty 1

## 2015-08-26 MED ORDER — PREDNISONE 20 MG PO TABS: 20.0000 mg | ORAL_TABLET | Freq: Every day | ORAL | Status: DC

## 2015-08-26 MED ORDER — ACETAMINOPHEN 325 MG PO TABS
650.0000 mg | ORAL_TABLET | Freq: Four times a day (QID) | ORAL | Status: DC | PRN
  Administered 2015-08-26 (×2): 650 mg via ORAL
  Filled 2015-08-26 (×2): qty 2

## 2015-08-26 MED ORDER — SODIUM CHLORIDE 0.9% FLUSH
3.0000 mL | Freq: Two times a day (BID) | INTRAVENOUS | Status: DC
  Administered 2015-08-26 – 2015-08-29 (×7): 3 mL via INTRAVENOUS

## 2015-08-26 MED ORDER — IPRATROPIUM-ALBUTEROL 0.5-2.5 (3) MG/3ML IN SOLN: 3.0000 mL | Freq: Four times a day (QID) | RESPIRATORY_TRACT | Status: DC

## 2015-08-26 MED ORDER — METHYLPREDNISOLONE SODIUM SUCC 125 MG IJ SOLR: 125.0000 mg | Freq: Once | INTRAMUSCULAR | Status: DC

## 2015-08-26 MED ORDER — ONDANSETRON HCL 4 MG PO TABS: 4.0000 mg | ORAL_TABLET | Freq: Four times a day (QID) | ORAL | Status: DC | PRN

## 2015-08-26 MED ORDER — DOXYCYCLINE HYCLATE 100 MG PO TABS
100.0000 mg | ORAL_TABLET | Freq: Two times a day (BID) | ORAL | Status: DC
  Administered 2015-08-26 – 2015-08-29 (×7): 100 mg via ORAL
  Filled 2015-08-26 (×7): qty 1

## 2015-08-26 MED ORDER — METHYLPREDNISOLONE SODIUM SUCC 125 MG IJ SOLR
60.0000 mg | Freq: Three times a day (TID) | INTRAMUSCULAR | Status: DC
  Administered 2015-08-26 – 2015-08-28 (×6): 60 mg via INTRAVENOUS
  Filled 2015-08-26 (×6): qty 2

## 2015-08-26 MED ORDER — FUROSEMIDE 10 MG/ML IJ SOLN
40.0000 mg | Freq: Once | INTRAMUSCULAR | Status: AC
  Administered 2015-08-26: 40 mg via INTRAVENOUS
  Filled 2015-08-26: qty 4

## 2015-08-26 MED ORDER — ALPRAZOLAM 0.25 MG PO TABS
0.2500 mg | ORAL_TABLET | Freq: Once | ORAL | Status: AC
  Administered 2015-08-26: 0.25 mg via ORAL
  Filled 2015-08-26: qty 1

## 2015-08-26 MED ORDER — HYDROMORPHONE HCL 1 MG/ML IJ SOLN: 0.5000 mg | INTRAMUSCULAR | Status: DC | PRN

## 2015-08-26 MED ORDER — APIXABAN 2.5 MG PO TABS
2.5000 mg | ORAL_TABLET | Freq: Two times a day (BID) | ORAL | Status: DC
  Administered 2015-08-26 – 2015-08-29 (×7): 2.5 mg via ORAL
  Filled 2015-08-26 (×8): qty 1

## 2015-08-26 MED ORDER — ALPRAZOLAM 0.25 MG PO TABS
0.2500 mg | ORAL_TABLET | Freq: Every day | ORAL | Status: DC
  Administered 2015-08-26: 0.25 mg via ORAL
  Filled 2015-08-26: qty 1

## 2015-08-26 MED ORDER — ALUM & MAG HYDROXIDE-SIMETH 200-200-20 MG/5ML PO SUSP: 30.0000 mL | Freq: Four times a day (QID) | ORAL | Status: DC | PRN

## 2015-08-26 MED ORDER — FUROSEMIDE 10 MG/ML IJ SOLN
40.0000 mg | Freq: Two times a day (BID) | INTRAMUSCULAR | Status: AC
  Administered 2015-08-26 – 2015-08-27 (×3): 40 mg via INTRAVENOUS
  Filled 2015-08-26 (×3): qty 4

## 2015-08-26 MED ORDER — FLUDROCORTISONE ACETATE 0.1 MG PO TABS
0.1000 mg | ORAL_TABLET | Freq: Every day | ORAL | Status: DC
  Administered 2015-08-26 – 2015-08-29 (×4): 0.1 mg via ORAL
  Filled 2015-08-26 (×4): qty 1

## 2015-08-26 MED ORDER — IPRATROPIUM-ALBUTEROL 0.5-2.5 (3) MG/3ML IN SOLN: 3.0000 mL | RESPIRATORY_TRACT | Status: DC

## 2015-08-26 MED ORDER — ACETAMINOPHEN 650 MG RE SUPP: 650.0000 mg | Freq: Four times a day (QID) | RECTAL | Status: DC | PRN

## 2015-08-26 MED ORDER — POTASSIUM CHLORIDE CRYS ER 10 MEQ PO TBCR
10.0000 meq | EXTENDED_RELEASE_TABLET | Freq: Every day | ORAL | Status: DC
Start: 2015-08-26 — End: 2015-08-29
  Administered 2015-08-26 – 2015-08-29 (×4): 10 meq via ORAL
  Filled 2015-08-26 (×4): qty 1

## 2015-08-26 MED ORDER — POTASSIUM CHLORIDE CRYS ER 20 MEQ PO TBCR
40.0000 meq | EXTENDED_RELEASE_TABLET | Freq: Once | ORAL | Status: AC
Start: 2015-08-26 — End: 2015-08-26
  Administered 2015-08-26: 40 meq via ORAL
  Filled 2015-08-26: qty 2

## 2015-08-26 MED ORDER — ALBUTEROL SULFATE (2.5 MG/3ML) 0.083% IN NEBU: 2.5000 mg | INHALATION_SOLUTION | RESPIRATORY_TRACT | Status: DC | PRN

## 2015-08-26 NOTE — Progress Notes (Signed)
At present time pt declined HH. Will continue to follow.

## 2015-08-26 NOTE — Progress Notes (Signed)
SATURATION QUALIFICATIONS: (This note is used to comply with regulatory documentation for home oxygen)  Patient Saturations on Room Air at Rest = 92%  Patient Saturations on Room Air while Ambulating = 80%    Please briefly explain why patient needs home oxygen: to maintain appropriate SaO2 levels Blondell Reveal Kistler PT 08/26/2015  [id number]

## 2015-08-26 NOTE — ED Provider Notes (Signed)
Patient signed out to me to follow-up on labs. She was seen earlier for respiratory distress that started 2 hours before arrival. Patient does have a history of COPD and CHF. She recently had her Lasix stopped because of hypokalemia. She also has been experiencing increased cough this week and was started on prednisone by her primary doctor.  Patient has been treated with Lasix and a dilator therapy as well as Solu-Medrol. She reports that she feels improved. She did, however, desaturate to 83% after ambulating in the hallway. She will require hospitalization for further management.  Results for orders placed or performed during the hospital encounter of [id number]  Basic metabolic panel  Result Value Ref Range   Sodium 142 135 - 145 mmol/L   Potassium 3.2 (L) 3.5 - 5.1 mmol/L   Chloride 98 (L) 101 - 111 mmol/L   CO2 34 (H) 22 - 32 mmol/L   Glucose, Bld 138 (H) 65 - 99 mg/dL   BUN 16 6 - 20 mg/dL   Creatinine, Ser 0.82 0.44 - 1.00 mg/dL   Calcium 8.7 (L) 8.9 - 10.3 mg/dL   GFR calc non Af Amer >60 >60 mL/min   GFR calc Af Amer >60 >60 mL/min   Anion gap 10 5 - 15  CBC  Result Value Ref Range   WBC 12.2 (H) 4.0 - 10.5 K/uL   RBC 4.51 3.87 - 5.11 MIL/uL   Hemoglobin 13.2 12.0 - 15.0 g/dL   HCT 40.2 36.0 - 46.0 %   MCV 89.1 78.0 - 100.0 fL   MCH 29.3 26.0 - 34.0 pg   MCHC 32.8 30.0 - 36.0 g/dL   RDW 15.5 11.5 - 15.5 %   Platelets 281 150 - 400 K/uL  Troponin I  Result Value Ref Range   Troponin I 0.03 <0.031 ng/mL  Brain natriuretic peptide  Result Value Ref Range   B Natriuretic Peptide 774.8 (H) 0.0 - 100.0 pg/mL   Dg Chest Port 1 View  08/25/2015  CLINICAL DATA:  Short of breath EXAM: PORTABLE CHEST 1 VIEW COMPARISON:  07/08/2015 FINDINGS: COPD with pulmonary hyperinflation. Heart size upper normal. Dual lead pacemaker unchanged. Negative for heart failure. No edema or effusion. Negative for mass or pneumonia. IMPRESSION: COPD without acute abnormality. Electronically Signed   By:  Franchot Gallo M.D.   On: 08/25/2015 22:41      Orpah Greek, MD 08/26/15 [phone]

## 2015-08-26 NOTE — Evaluation (Signed)
Physical Therapy Evaluation Patient Details Name: [name] MRN: [id number] DOB: April 08, 1931 Today's Date: 08/26/2015   History of Present Illness  82 y.o. female with a history of COPD, Diastolic CHF, Paroxsymal Atrial Fibrillation on Eliquis Rx, SSS and CHB S/P Pacemaker, HTN, and Dermatomyositis admitted with SOB, wheezing. Dx of acute respiratory failure, COPD exacerbation, CHF.   Clinical Impression  Pt admitted with above diagnosis. Pt currently with functional limitations due to the deficits listed below (see PT Problem List). Pt ambulated 24' with hand held assist of 1 for balance, SaO2 80% on RA with ambulation, 92% on RA at rest.  Pt will benefit from skilled PT to increase their independence and safety with mobility to allow discharge to the venue listed below.       Follow Up Recommendations Home health PT;Supervision for mobility/OOB    Equipment Recommendations  None recommended by PT    Recommendations for Other Services       Precautions / Restrictions Precautions Precautions: Other (comment) Precaution Comments: monitor O2 Restrictions Weight Bearing Restrictions: No      Mobility  Bed Mobility Overal bed mobility: Modified Independent             General bed mobility comments: with bedrail  Transfers Overall transfer level: Needs assistance Equipment used: 1 person hand held assist Transfers: Sit to/from Stand Sit to Stand: Min guard         General transfer comment: min/guard for safety/balance  Ambulation/Gait Ambulation/Gait assistance: Min assist Ambulation Distance (Feet): 90 Feet Assistive device: 1 person hand held assist Gait Pattern/deviations: Step-through pattern   Gait velocity interpretation: at or above normal speed for age/gender General Gait Details: min HHA for balance, SaO2 92% at rest on RA, 80% on RA with walking  Stairs            Wheelchair Mobility    Modified Rankin (Stroke Patients Only)        Balance Overall balance assessment: Needs assistance   Sitting balance-Leahy Scale: Good       Standing balance-Leahy Scale: Fair                               Pertinent Vitals/Pain Pain Assessment: No/denies pain    Home Living Family/patient expects to be discharged to:: Private residence Living Arrangements: Alone Available Help at Discharge: Family;Available PRN/intermittently Type of Home: House Home Access: Stairs to enter;Ramped entrance   Entrance Stairs-Number of Steps: 1 STE, ramp available if needed Home Layout: One level Home Equipment: Walker - 2 wheels;Bedside commode;Grab bars - tub/shower;Grab bars - toilet Additional Comments: daughter borrowing equipment for TKA 08/28/15    Prior Function Level of Independence: Independent               Hand Dominance        Extremity/Trunk Assessment   Upper Extremity Assessment: Overall WFL for tasks assessed           Lower Extremity Assessment: Overall WFL for tasks assessed      Cervical / Trunk Assessment: Normal  Communication   Communication: No difficulties  Cognition Arousal/Alertness: Awake/alert Behavior During Therapy: WFL for tasks assessed/performed Overall Cognitive Status: Within Functional Limits for tasks assessed                      General Comments      Exercises        Assessment/Plan    PT  Assessment Patient needs continued PT services  PT Diagnosis Difficulty walking   PT Problem List Decreased activity tolerance;Cardiopulmonary status limiting activity;Decreased balance;Decreased mobility  PT Treatment Interventions DME instruction;Gait training;Functional mobility training;Therapeutic activities;Therapeutic exercise   PT Goals (Current goals can be found in the Care Plan section) Acute Rehab PT Goals Patient Stated Goal: to gamble in [place] PT Goal Formulation: With patient/family Time For Goal Achievement: 09/09/15 Potential to Achieve  Goals: Good    Frequency Min 3X/week   Barriers to discharge        Co-evaluation               End of Session Equipment Utilized During Treatment: Gait belt Activity Tolerance: Patient tolerated treatment well Patient left: in chair;with call bell/phone within reach;with family/visitor present;with chair alarm set Nurse Communication: Mobility status         Time: [id number] PT Time Calculation (min) (ACUTE ONLY): 25 min   Charges:   PT Evaluation $PT Eval Low Complexity: 1 Procedure PT Treatments $Gait Training: 8-22 mins   PT G Codes:        [name] 08/26/2015, 2:35 PM [phone]

## 2015-08-26 NOTE — H&P (Addendum)
[name] Admission History and Physical       [name] [id number] DOB: 1931-03-18 DOA: 08/25/2015  Referring physician: EDP PCP: Debbie Argyle, MD  Specialists:   Chief Complaint: SOB  HPI: [name] is a 82 y.o. female with a history of COPD, Diastolic CHF, Paroxsymal Atrial Fibrillation on Eliquis Rx, SSS and CHB S/P Pacemaker, HTN, and Dermatomyositis who presents to the ED with complaints of increased SOB and Wheezes 2 hours before arrival to the ED.   She denies any Chest pain.  She was found to have hypoxia to the 83% and was placed on 2 liters NCO2.  Her chest  X-Ray revealed COPD changes.   She was administered Neb Treatments, IV Solumedrol, and IV Magnesium and had mild improvement.   She was also given IV Lasix x 1.   She was then referred for admission.     Of Note: she reports being taken off her Lasix Rx about 1 week ago due to Hypokalemia.       Review of Systems:  Constitutional: No Weight Loss, No Weight Gain, Night Sweats, Fevers, Chills, Dizziness, Light Headedness, Fatigue, or Generalized Weakness HEENT: No Headaches, Difficulty Swallowing,Tooth/Dental Problems,Sore Throat,  No Sneezing, Rhinitis, Ear Ache, Nasal Congestion, or Post Nasal Drip,  Cardio-vascular:  No Chest pain, Orthopnea, PND, Edema in Lower Extremities, Anasarca, Dizziness, Palpitations  Resp: No +Dyspnea, No DOE, No Productive Cough, +Non-Productive Cough, No Hemoptysis, +Wheezing.    GI: No Heartburn, Indigestion, Abdominal Pain, Nausea, Vomiting, Diarrhea, Constipation, Hematemesis, Hematochezia, Melena, Change in Bowel Habits,  Loss of Appetite  GU: No Dysuria, No Change in Color of Urine, No Urgency or Urinary Frequency, No Flank pain.  Musculoskeletal: No Joint Pain or Swelling, No Decreased Range of Motion, No Back Pain.  Neurologic: No Syncope, No Seizures, Muscle Weakness, Paresthesia, Vision Disturbance or Loss, No Diplopia, No Vertigo, No Difficulty Walking,   Skin: No Rash or Lesions. Psych: No Change in Mood or Affect, No Depression or Anxiety, No Memory loss, No Confusion, or Hallucinations    Past Medical History  Diagnosis Date  . Syncope   . COPD (chronic obstructive pulmonary disease) ([place])   . DM (dermatomyositis)   . Orthostatic hypotension   . Systemic hypertension   . Thoracic kyphosis   . Scoliosis   . Pacemaker 07/13/2013    Her original pacemaker and the current leads were implanted in 1992. She has had 2 generator change out, most recently in 2010. Her device is a Buyer, retail 2110 non RF dual-chamber pacemaker with a battery longevity estimated at about 7 years. The atrial lead is a St. Jude [id number] and the ventricular lead was a Biotronik [id number].   [name] CHB (complete heart block) ([place]) 07/13/2013    Pacemaker dependent  . SSS (sick sinus syndrome) ([place]) 07/13/2013  . Paroxysmal atrial fibrillation ([place]) 09/04/2014  . CHF (congestive heart failure) [facility])       Past Surgical History  Procedure Laterality Date  . Permanent pacemaker generator change  03/27/2009    St.Jude  . Nm myocar perf wall motion  09/13/2011    Low risk  . US echocardiography  03/28/2012    Mod LAE,mild MR,aortic sclerosis w/mod AI,mod. TR,mild PI,Stage I diastolic dysfunction      Prior to Admission medications   Medication Sig Start Date End Date Taking? Authorizing Provider  ALPRAZolam (XANAX) 0.25 MG tablet Take 0.25 mg by mouth at bedtime.    Yes Historical Provider, MD  apixaban [name])  5 MG TABS tablet Take 1 tablet (5 mg total) by mouth 2 (two) times daily. 07/20/15  Yes Debbie Croitoru, MD  escitalopram (LEXAPRO) 10 MG tablet TK 1 T PO QD 08/10/15  Yes Historical Provider, MD  fludrocortisone (FLORINEF) 0.1 MG tablet TK 1 T PO  BID 07/31/15  Yes Historical Provider, MD  metoprolol succinate (TOPROL-XL) 25 MG 24 hr tablet Take 12.5-25 tablets by mouth 2 (two) times daily. Take 1 tablet in the morning and 1/2 tablet in the evening 05/15/15  Yes  Historical Provider, MD  Potassium Chloride CR (MICRO-K) 8 MEQ CPCR capsule CR TAKE 1 CAPSULE BY MOUTH ONCE DAILY 08/12/15  Yes Historical Provider, MD  predniSONE (DELTASONE) 20 MG tablet Take 1 tablet by mouth daily. 05/12/15  Yes Historical Provider, MD  alendronate (FOSAMAX) 70 MG tablet TK 1 T PO WEEKLY 08/18/15   Historical Provider, MD  furosemide (LASIX) 20 MG tablet Take 1 tablet (20 mg total) by mouth every other day. Patient not taking: Reported on 08/25/2015 06/10/15   Debbie Sells, MD  levofloxacin (LEVAQUIN) 750 MG tablet Take 1 tablet (750 mg total) by mouth every other day. Patient not taking: Reported on 08/25/2015 06/10/15   Debbie Sells, MD     No Known Allergies    Social History:  reports that she has never smoked. She does not have any smokeless tobacco history on file. She reports that she does not drink alcohol or use illicit drugs.     No family history on file.     Physical Exam:  GEN:  Pleasant Elderly Well Nourished and Well Devleped  82 y.o. Caucasian female examined and in no acute distress; cooperative with exam Filed Vitals:   08/25/15 2151 08/25/15 2209 08/25/15 2253 08/26/15 0048  BP:  140/77  128/97  Pulse:  73  80  Temp:  98.2 F (36.8 C)    TempSrc:  Oral    Resp:  22  19  Height:  5\' 5"  (1.651 m)    Weight:  52.164 kg (115 lb)    SpO2: 100% 87% 95% 92%   Blood pressure 128/97, pulse 80, temperature 98.2 F (36.8 C), temperature source Oral, resp. rate 19, height 5\' 5"  (1.651 m), weight 52.164 kg (115 lb), SpO2 92 %. PSYCH: She is alert and oriented x4; does not appear anxious does not appear depressed; affect is normal HEENT: Normocephalic and Atraumatic, Mucous membranes pink; PERRLA; EOM intact; Fundi:  Benign;  No scleral icterus, Nares: Patent, Oropharynx: Clear, Edentulous with Dentures,    Neck:  FROM, No Cervical Lymphadenopathy nor Thyromegaly or Carotid Bruit; No JVD; Breasts:: Not examined CHEST WALL: No  tenderness CHEST: Decreased Breath Sounds, Occasional expiratory Wheezes,  HEART: Regular rate and rhythm; no murmurs rubs or gallops BACK: No kyphosis or scoliosis; No CVA tenderness ABDOMEN: Positive Bowel Sounds, Soft Non-Tender, No Rebound or Guarding; No Masses, No Organomegaly. Rectal Exam: Not done EXTREMITIES: No Cyanosis, Clubbing, or Edema; No Ulcerations. Genitalia: not examined PULSES: 2+ and symmetric SKIN: Normal hydration no rash or ulceration CNS:  Alert and Oriented x 4, No Focal Deficits Vascular: pulses palpable throughout    Labs on Admission:  Basic Metabolic Panel:  Recent Labs Lab 08/25/15 2217  NA 142  K 3.2*  CL 98*  CO2 34*  GLUCOSE 138*  BUN 16  CREATININE 0.82  CALCIUM 8.7*   Liver Function Tests: No results for input(s): AST, ALT, ALKPHOS, BILITOT, PROT, ALBUMIN in the last 168 hours. No results for input(s):  LIPASE, AMYLASE in the last 168 hours. No results for input(s): AMMONIA in the last 168 hours. CBC:  Recent Labs Lab 08/26/15  WBC 12.2*  HGB 13.2  HCT 40.2  MCV 89.1  PLT 281   Cardiac Enzymes:  Recent Labs Lab 08/25/15 2217  TROPONINI 0.03    BNP (last 3 results)  Recent Labs  06/07/15 0205 08/25/15 2217  BNP 536.4* 774.8*    ProBNP (last 3 results) No results for input(s): PROBNP in the last 8760 hours.  CBG: No results for input(s): GLUCAP in the last 168 hours.  Radiological Exams on Admission: Dg Chest Port 1 View  08/25/2015  CLINICAL DATA:  Short of breath EXAM: PORTABLE CHEST 1 VIEW COMPARISON:  07/08/2015 FINDINGS: COPD with pulmonary hyperinflation. Heart size upper normal. Dual lead pacemaker unchanged. Negative for heart failure. No edema or effusion. Negative for mass or pneumonia. IMPRESSION: COPD without acute abnormality. Electronically Signed   By: Franchot Gallo M.D.   On: 08/25/2015 22:41     EKG: Independently reviewed. Paced rate =77.      Assessment/Plan:   82 y.o. female with   Principal Problem:    Acute respiratory failure with hypoxia ([place])- due to COPD Exac, and Chronic CHF   O2 PRN   Active Problems:    COPD exacerbation (HCC)   DuoNebs   Steroid Taper   O2 PRN      Diastolic CHF (HCC)- chronic   IV Lasix given   Acute CHF Protocol ordered    IV Lasix BID for 3 more doses as BP tolerates   Monitor I/Os, and Electrolytes   2D ECHO in AM      Hypokalemia-   Replace K+   Check Magnesium      Pacemaker- due to SSS, and CHB   stable      Paroxysmal atrial fibrillation (HCC)    On Metoprolol and Eliquis Rx      Systemic hypertension    On Metoprolol Rx      Anticoagulant long-term use    On Eliquis for Paroxsymal Atrial Fibrillation      DM (dermatomyositis)   On Prednisone Rx    DVT Prophylaxis   On Eliquis          Code Status:     FULL CODE        Family Communication:   No Family Present    Disposition Plan:    Inpatient Status        Time spent: 47 Minutes      Theressa Millard [name] Pager [phone]   If 7AM -7PM Please Contact the Day Rounding Team MD for [name]  If 7PM-7AM, Please Contact Night-Floor Coverage  [email or web address] Password [email or web address] 08/26/2015, 1:13 AM     ADDENDUM:   Patient was seen and examined on 08/26/2015

## 2015-08-26 NOTE — Progress Notes (Signed)
Patient seen and examined. Admitted after midnight secondary to shortness of breath. Currently afebrile and breathing easier. Found to have acute on chronic diastolic heart failure exacerbation and also COPD exacerbation. Patient denies chest pain, he is afebrile, no nausea and w/o vomiting. She's is still having difficulty speaking in full sentences, with ongoing wheezing and some crackles bibasilar. For further info/details on admission please refer to H&P written by Dr. Arnoldo Morale   Plan: -Continue IV diuresis -Will follow 2-D echo results -Follow daily weights, intake and output and low sodium diet -Continue treatment for COPD exacerbation (nebulizations, steroids, antibiotics, flutter valve and mucolytic's) -Continue supportive care and as tolerated weaned off oxygen supplementation to room air -Follow clinical response.  Barton Dubois [id number]

## 2015-08-26 NOTE — Progress Notes (Signed)
Echocardiogram 2D Echocardiogram has been performed.  Tresa Res 08/26/2015, 11:58 AM

## 2015-08-27 DIAGNOSIS — I10 Essential (primary) hypertension: Secondary | ICD-10-CM

## 2015-08-27 DIAGNOSIS — I951 Orthostatic hypotension: Secondary | ICD-10-CM

## 2015-08-27 DIAGNOSIS — E876 Hypokalemia: Secondary | ICD-10-CM

## 2015-08-27 DIAGNOSIS — Z7901 Long term (current) use of anticoagulants: Secondary | ICD-10-CM

## 2015-08-27 DIAGNOSIS — I480 Paroxysmal atrial fibrillation: Secondary | ICD-10-CM

## 2015-08-27 LAB — BASIC METABOLIC PANEL
ANION GAP: 9 (ref 5–15)
BUN: 16 mg/dL (ref 6–20)
CHLORIDE: 94 mmol/L — AB (ref 101–111)
CO2: 38 mmol/L — ABNORMAL HIGH (ref 22–32)
CREATININE: 0.74 mg/dL (ref 0.44–1.00)
Calcium: 8.5 mg/dL — ABNORMAL LOW (ref 8.9–10.3)
GFR calc non Af Amer: >60 mL/min (ref >60)
Glucose, Bld: 102 mg/dL — ABNORMAL HIGH (ref 65–99)
Potassium: 3.7 mmol/L (ref 3.5–5.1)
SODIUM: 141 mmol/L (ref 135–145)

## 2015-08-27 MED ORDER — ALPRAZOLAM 0.25 MG PO TABS
0.2500 mg | ORAL_TABLET | Freq: Two times a day (BID) | ORAL | Status: DC | PRN
Start: 2015-08-27 — End: 2015-08-29
  Administered 2015-08-27 – 2015-08-28 (×2): 0.25 mg via ORAL
  Filled 2015-08-27 (×2): qty 1

## 2015-08-27 MED ORDER — IPRATROPIUM-ALBUTEROL 0.5-2.5 (3) MG/3ML IN SOLN
3.0000 mL | Freq: Three times a day (TID) | RESPIRATORY_TRACT | Status: DC
Start: 2015-08-27 — End: 2015-08-29
  Administered 2015-08-27 – 2015-08-28 (×3): 3 mL via RESPIRATORY_TRACT
  Filled 2015-08-27 (×4): qty 3

## 2015-08-27 MED ORDER — GUAIFENESIN ER 600 MG PO TB12
600.0000 mg | ORAL_TABLET | Freq: Two times a day (BID) | ORAL | Status: DC
  Administered 2015-08-27 – 2015-08-29 (×4): 600 mg via ORAL
  Filled 2015-08-27 (×4): qty 1

## 2015-08-27 NOTE — Progress Notes (Signed)
[facility] PROGRESS NOTE  [name] [id number] DOB: [birth year] DOA: 08/25/2015 PCP: Mathews Argyle, MD  Assessment/Plan: 1-acute resp failure with hypoxia -due to COPD and CHF exacerbation -will continue IV lasix, solumedrol, antibiotics, pulmicort, PRN nebulizer, flutter valve and mucinex -daily weight, low sodium diet and strict I's and O's -2-D echo with preserved EF and no wall motion abnormalities  -will follow clinical response -continue oxygen supplementation as needed   2-acute on chronic diastolic heart failure  -treatment as mentioned above -continue lopressor -continue IV diuresis -heart healthy diet -follow clinical response   3-orthostatic hypotension  -continue florinef -steroids for COPD also helping -BP soft but stable  4-depression: -will continue lexapro  5-paroxysmal A. Fib: also with hx of SSS/complete heart block -s/p pacemaker -continue lopressor for rate control -CHADsVASC score 3 -continue eliquis  6-hypokalemia: due to diuresis -will replete as needed   7-anxiety: -continue PRN xanax   Code Status: Full Family Communication: discussed with daughter [name] Disposition Plan: continue inpatient treatment, steroids, nebulizer, antibiotics and IV lasix. repelte electrolytes and continue oxygen supplementation    Consultants:  None   Procedures:  2-D echo: - Left ventricle: The cavity size was normal. There was mild  concentric hypertrophy. Systolic function was normal. The  estimated ejection fraction was in the range of 60% to 65%. Wall  motion was normal; there were no regional wall motion  abnormalities. Doppler parameters are consistent with abnormal  left ventricular relaxation (grade 1 diastolic dysfunction).  Doppler parameters are consistent with elevated ventricular  end-diastolic filling pressure. - Aortic valve: Trileaflet; mildly thickened, mildly calcified  leaflets. There was mild stenosis.  There was moderate  regurgitation. Mean gradient (S): 8 mm Hg. Peak gradient (S): 17  mm Hg. Valve area (VTI): 2.11 cm^2. Valve area (Vmax): 2.19 cm^2.  Valve area (Vmean): 2.11 cm^2. - Aortic root: The aortic root was normal in size. - Mitral valve: Structurally normal valve. There was moderate  regurgitation. - Left atrium: The atrium was mildly dilated. - Right ventricle: Systolic function was normal. - Right atrium: The atrium was mildly dilated. - Tricuspid valve: There was moderate regurgitation. - Pulmonary arteries: Systolic pressure was moderately increased.  PA peak pressure: 50 mm Hg (S). - Inferior vena cava: The vessel was dilated. The respirophasic  diameter changes were blunted (< 50%), consistent with elevated  central venous pressure. - Pericardium, extracardiac: There was no pericardial effusion.  Antibiotics:  Doxycycline   HPI/Subjective: Afebrile, improving in general. Patient still wheezing and requiring oxygen supplementation; but with improvement in air movement and almost able to speak in full sentences during examination   Objective: Filed Vitals:   08/27/15 1142 08/27/15 1324  BP: 94/51 101/86  Pulse: 74 73  Temp: 98.2 F (36.8 C)   Resp: 20 24    Intake/Output Summary (Last 24 hours) at 08/27/15 1708 Last data filed at 08/27/15 1324  Gross per 24 hour  Intake    120 ml  Output    800 ml  Net   -680 ml   Filed Weights   08/25/15 2209 08/26/15 0355 08/27/15 0443  Weight: 52.164 kg (115 lb) 51.302 kg (113 lb 1.6 oz) 51.438 kg (113 lb 6.4 oz)    Exam:   General: afebrile, breathing slightly better; but still wheezing and requiring O2 supplementation; no CP and having less trouble while speaking in full sentences.  Cardiovascular: no murmur, no rubs, no gallops; no JVD appreciated on exam  Respiratory: positive wheezing, scattered rhonchi and mild  decrease BS at her bases  Abdomen: soft, NT, ND, positive BS  Musculoskeletal: no  edema, no cyanosis   Data Reviewed: Basic Metabolic Panel:  Recent Labs Lab 08/25/15 2217 08/26/15 0509 08/27/15 0438  NA 142 142 141  K 3.2* 3.1* 3.7  CL 98* 97* 94*  CO2 34* 30 38*  GLUCOSE 138* 168* 102*  BUN 16 15 16   CREATININE 0.82 0.80 0.74  CALCIUM 8.7* 8.3* 8.5*   CBC:  Recent Labs Lab 08/26/15 08/26/15 0509  WBC 12.2* 12.2*  HGB 13.2 11.9*  HCT 40.2 37.2  MCV 89.1 91.0  PLT 281 254   Cardiac Enzymes:  Recent Labs Lab 08/25/15 2217  TROPONINI 0.03   BNP (last 3 results)  Recent Labs  06/07/15 0205 08/25/15 2217  BNP 536.4* 774.8*    Studies: Dg Chest Port 1 View  08/25/2015  CLINICAL DATA:  Short of breath EXAM: PORTABLE CHEST 1 VIEW COMPARISON:  07/08/2015 FINDINGS: COPD with pulmonary hyperinflation. Heart size upper normal. Dual lead pacemaker unchanged. Negative for heart failure. No edema or effusion. Negative for mass or pneumonia. IMPRESSION: COPD without acute abnormality. Electronically Signed   By: Franchot Gallo M.D.   On: 08/25/2015 22:41    Scheduled Meds: . apixaban  2.5 mg Oral BID  . doxycycline  100 mg Oral Q12H  . escitalopram  10 mg Oral Daily  . fludrocortisone  0.1 mg Oral Daily  . ipratropium-albuterol  3 mL Nebulization TID  . methylPREDNISolone (SOLU-MEDROL) injection  60 mg Intravenous 3 times per day  . metoprolol succinate  12.5 mg Oral QHS  . metoprolol succinate  25 mg Oral Daily  . potassium chloride  10 mEq Oral Daily  . potassium chloride  20 mEq Oral BID  . sodium chloride flush  3 mL Intravenous Q12H   Continuous Infusions: . sodium chloride 10 mL/hr (08/25/15 2305)    Principal Problem:   Acute respiratory failure with hypoxia (HCC) Active Problems:   Pacemaker   Paroxysmal atrial fibrillation (HCC)   COPD exacerbation (HCC)   Diastolic CHF (HCC)   Systemic hypertension   Hypokalemia   Anticoagulant long-term use   DM (dermatomyositis)   Acute on chronic diastolic CHF (congestive heart failure),  NYHA class 1 ([place])    Time spent: 30 minutes    Barton Dubois  [facility] Pager [phone]. If 7PM-7AM, please contact night-coverage at [email or web address], password [facility] 08/27/2015, 5:08 PM  LOS: 1 day

## 2015-08-27 NOTE — Progress Notes (Signed)
Physical Therapy Treatment Patient Details Name: [name] MRN: [id number] DOB: 1931-02-27 Today's Date: 08/27/2015    History of Present Illness 82 y.o. female with a history of COPD, Diastolic CHF, Paroxsymal Atrial Fibrillation on Eliquis Rx, SSS and CHB S/P Pacemaker, HTN, and Dermatomyositis admitted with SOB, wheezing. Dx of acute respiratory failure, COPD exacerbation, CHF.     PT Comments    Pt in bed on 2 lts at 96%.  Removed O2 for trial.  RA at rest 92% and decreased to 85% with amb.  Required 2 lts to achieve sats above 90%.  Tolerated amb around full unit with 1/4 dyspnea.  Did not require the use of any AD.  Good balance and steady gait.    Follow Up Recommendations  Home health PT;Supervision for mobility/OOB  SATURATION QUALIFICATIONS: (This note is used to comply with regulatory documentation for home oxygen)  Patient Saturations on Room Air at Rest = 92%  Patient Saturations on Room Air while Ambulating = 85%  Patient Saturations on 2 Liters of oxygen while Ambulating = 90%  Please briefly explain why patient needs home oxygen:  Pt required supplemental oxygen to achieve therapeutic level   Equipment Recommendations       Recommendations for Other Services       Precautions / Restrictions Precautions Precautions: Fall Precaution Comments: monitor O2 Restrictions Weight Bearing Restrictions: No    Mobility  Bed Mobility Overal bed mobility: Modified Independent             General bed mobility comments: in and out  Transfers Overall transfer level: Needs assistance Equipment used: 1 person hand held assist Transfers: Sit to/from [employer] Transfers Sit to Stand: Supervision;Min guard Stand pivot transfers: Supervision;Min guard       General transfer comment: min/guard for safety/balance and caution to O2 tubing   Ambulation/Gait Ambulation/Gait assistance: Min guard Ambulation Distance (Feet): 350 Feet Assistive device: 1  person hand held assist Gait Pattern/deviations: Step-through pattern Gait velocity: WFL   General Gait Details: min HHA for balance, RA SaO2 92% at rest on RA, 85% on RA with walking   Stairs            Wheelchair Mobility    Modified Rankin (Stroke Patients Only)       Balance                                    Cognition Arousal/Alertness: Awake/alert Behavior During Therapy: WFL for tasks assessed/performed Overall Cognitive Status: Within Functional Limits for tasks assessed                      Exercises      General Comments        Pertinent Vitals/Pain Pain Assessment: 0-10    Home Living                      Prior Function            PT Goals (current goals can now be found in the care plan section) Progress towards PT goals: Progressing toward goals    Frequency  Min 3X/week    PT Plan Current plan remains appropriate    Co-evaluation             End of Session Equipment Utilized During Treatment: Gait belt Activity Tolerance: Patient tolerated treatment well Patient left: in bed;with call  bell/phone within reach;with bed alarm set     Time: 1050-1120 PT Time Calculation (min) (ACUTE ONLY): 30 min  Charges:  $Gait Training: 8-22 mins $Therapeutic Activity: 8-22 mins                    G Codes:      Rica Koyanagi  PTA [facility]  Acute  Rehab Pager      [phone]

## 2015-08-28 LAB — BASIC METABOLIC PANEL
Anion gap: 9 (ref 5–15)
BUN: 25 mg/dL — AB (ref 6–20)
CALCIUM: 8.9 mg/dL (ref 8.9–10.3)
CO2: 35 mmol/L — AB (ref 22–32)
Chloride: 96 mmol/L — ABNORMAL LOW (ref 101–111)
Creatinine, Ser: 0.65 mg/dL (ref 0.44–1.00)
GFR calc Af Amer: >60 mL/min (ref >60)
GLUCOSE: 138 mg/dL — AB (ref 65–99)
POTASSIUM: 4.2 mmol/L (ref 3.5–5.1)
Sodium: 140 mmol/L (ref 135–145)

## 2015-08-28 MED ORDER — PREDNISONE 20 MG PO TABS
60.0000 mg | ORAL_TABLET | Freq: Every day | ORAL | Status: DC
  Administered 2015-08-29: 60 mg via ORAL
  Filled 2015-08-28: qty 3

## 2015-08-28 MED ORDER — FUROSEMIDE 20 MG PO TABS
20.0000 mg | ORAL_TABLET | Freq: Every day | ORAL | Status: DC
  Administered 2015-08-29: 20 mg via ORAL
  Filled 2015-08-28: qty 1

## 2015-08-28 NOTE — Progress Notes (Signed)
Physical Therapy Treatment Patient Details Name: [name] MRN: [id number] DOB: 08/22/1930 Today's Date: 08/28/2015    History of Present Illness 82 y.o. female with a history of COPD, Diastolic CHF, Paroxsymal Atrial Fibrillation on Eliquis Rx, SSS and CHB S/P Pacemaker, HTN, and Dermatomyositis admitted with SOB, wheezing. Dx of acute respiratory failure, COPD exacerbation, CHF.     PT Comments    Pt slowly feeling better.  Assisted OOb to amb to bathroom then in hallway.  SATURATION QUALIFICATIONS: (This note is used to comply with regulatory documentation for home oxygen)  Patient Saturations on Room Air at Rest = 92%  Patient Saturations on Room Air while Ambulating = 87%  Patient Saturations on 2 Liters of oxygen while Ambulating = 90%  Please briefly explain why patient needs home oxygen:  Pt requires supplemental oxygen to achieve therapeutic levels.   Follow Up Recommendations  Home health PT;Supervision for mobility/OOB     Equipment Recommendations  None recommended by PT    Recommendations for Other Services       Precautions / Restrictions Precautions Precautions: Fall Precaution Comments: monitor O2 Restrictions Weight Bearing Restrictions: No    Mobility  Bed Mobility Overal bed mobility: Modified Independent                Transfers Overall transfer level: Needs assistance Equipment used: 1 person hand held assist Transfers: Sit to/from Stand Sit to Stand: Supervision;Min guard         General transfer comment: min/guard for safety/balance and caution to O2 tubing   also assisted to bathroom  Ambulation/Gait Ambulation/Gait assistance: Supervision;Min guard Ambulation Distance (Feet): 350 Feet Assistive device: 1 person hand held assist Gait Pattern/deviations: Step-to pattern Gait velocity: WFL   General Gait Details: min HHA for balance, RA SaO2 92% at rest on RA, 87% on RA with walking.   Stairs             Wheelchair Mobility    Modified Rankin (Stroke Patients Only)       Balance                                    Cognition Arousal/Alertness: Awake/alert Behavior During Therapy: WFL for tasks assessed/performed Overall Cognitive Status: Within Functional Limits for tasks assessed                      Exercises      General Comments        Pertinent Vitals/Pain Pain Assessment: No/denies pain    Home Living                      Prior Function            PT Goals (current goals can now be found in the care plan section) Progress towards PT goals: Progressing toward goals    Frequency  Min 3X/week    PT Plan Current plan remains appropriate    Co-evaluation             End of Session Equipment Utilized During Treatment: Gait belt Activity Tolerance: Patient tolerated treatment well Patient left: in chair;with call bell/phone within reach;with chair alarm set     Time: 1140-1205 PT Time Calculation (min) (ACUTE ONLY): 25 min  Charges:  $Gait Training: 8-22 mins $Therapeutic Activity: 8-22 mins  G Codes:      Rica Koyanagi  PTA [facility]  Acute  Rehab Pager      [phone]

## 2015-08-28 NOTE — Progress Notes (Signed)
[facility] PROGRESS NOTE  [name] [id number] DOB: 04-25-1931 DOA: 08/25/2015 PCP: Mathews Argyle, MD  Assessment/Plan: 1-acute resp failure with hypoxia -due to COPD and CHF exacerbation -will transition to PO lasix (low dose), prednisone now, antibiotics, pulmicort, PRN nebulizer, flutter valve and mucinex -daily weight, low sodium diet and strict I's and O's -2-D echo with preserved EF and no wall motion abnormalities  -will follow clinical response -continue oxygen supplementation as needed   2-acute on chronic diastolic heart failure  -treatment as mentioned above -continue lopressor -heart healthy diet and daily weights -follow clinical response -Will plan to discharge on Lasix 20 mg by mouth daily.   3-orthostatic hypotension  -continue florinef -steroids for COPD also helping -BP stable  4-depression: -will continue lexapro  5-paroxysmal A. Fib: also with hx of SSS/complete heart block -s/p pacemaker -continue lopressor for rate control -CHADsVASC score 3 -continue eliquis 2.5mg  BID (dose adjusted for her age)  6-hypokalemia: due to diuresis -will replete as needed   7-anxiety: -continue PRN xanax   Code Status: Full Family Communication: discussed with daughter [name] Disposition Plan: Will transition her treatment to oral regimen, reassess home oxygen needs, follow supportive care and if remains stable most likely discharge in a.m.   Consultants:  None   Procedures:  2-D echo: - Left ventricle: The cavity size was normal. There was mild  concentric hypertrophy. Systolic function was normal. The  estimated ejection fraction was in the range of 60% to 65%. Wall  motion was normal; there were no regional wall motion  abnormalities. Doppler parameters are consistent with abnormal  left ventricular relaxation (grade 1 diastolic dysfunction).  Doppler parameters are consistent with elevated ventricular  end-diastolic filling  pressure. - Aortic valve: Trileaflet; mildly thickened, mildly calcified  leaflets. There was mild stenosis. There was moderate  regurgitation. Mean gradient (S): 8 mm Hg. Peak gradient (S): 17  mm Hg. Valve area (VTI): 2.11 cm^2. Valve area (Vmax): 2.19 cm^2.  Valve area (Vmean): 2.11 cm^2. - Aortic root: The aortic root was normal in size. - Mitral valve: Structurally normal valve. There was moderate  regurgitation. - Left atrium: The atrium was mildly dilated. - Right ventricle: Systolic function was normal. - Right atrium: The atrium was mildly dilated. - Tricuspid valve: There was moderate regurgitation. - Pulmonary arteries: Systolic pressure was moderately increased.  PA peak pressure: 50 mm Hg (S). - Inferior vena cava: The vessel was dilated. The respirophasic  diameter changes were blunted (< 50%), consistent with elevated  central venous pressure. - Pericardium, extracardiac: There was no pericardial effusion.  Antibiotics:  Doxycycline   HPI/Subjective: Afebrile, breathing a whole lot better and able to speak in full sentences. Patient denies chest pain and orthopnea. Oxygen supplementation still required with exertion.   Objective: Filed Vitals:   08/28/15 0510 08/28/15 1507  BP: 133/52 123/50  Pulse: 78 78  Temp: 98.2 F (36.8 C) 97.9 F (36.6 C)  Resp: 20 20    Intake/Output Summary (Last 24 hours) at 08/28/15 1812 Last data filed at 08/28/15 1508  Gross per 24 hour  Intake   2780 ml  Output      0 ml  Net   2780 ml   Filed Weights   08/26/15 0355 08/27/15 0443 08/28/15 0500  Weight: 51.302 kg (113 lb 1.6 oz) 51.438 kg (113 lb 6.4 oz) 50.984 kg (112 lb 6.4 oz)    Exam:   General: afebrile, breathing A lot better; with just minimal wheezing on exam and  able to speak in full sentences. Patient is still requiring O2 supplementation for exertion.  Cardiovascular: no murmur, no rubs, no gallops; no JVD appreciated on exam  Respiratory:  positive wheezing, scattered rhonchi and mild decrease BS at her bases  Abdomen: soft, NT, ND, positive BS  Musculoskeletal: no edema, no cyanosis   Data Reviewed: Basic Metabolic Panel:  Recent Labs Lab 08/25/15 2217 08/26/15 0509 08/27/15 0438 08/28/15 0417  NA 142 142 141 140  K 3.2* 3.1* 3.7 4.2  CL 98* 97* 94* 96*  CO2 34* 30 38* 35*  GLUCOSE 138* 168* 102* 138*  BUN 16 15 16  25*  CREATININE 0.82 0.80 0.74 0.65  CALCIUM 8.7* 8.3* 8.5* 8.9   CBC:  Recent Labs Lab 08/26/15 08/26/15 0509  WBC 12.2* 12.2*  HGB 13.2 11.9*  HCT 40.2 37.2  MCV 89.1 91.0  PLT 281 254   Cardiac Enzymes:  Recent Labs Lab 08/25/15 2217  TROPONINI 0.03   BNP (last 3 results)  Recent Labs  06/07/15 0205 08/25/15 2217  BNP 536.4* 774.8*    Studies: No results found.  Scheduled Meds: . apixaban  2.5 mg Oral BID  . doxycycline  100 mg Oral Q12H  . escitalopram  10 mg Oral Daily  . fludrocortisone  0.1 mg Oral Daily  . guaiFENesin  600 mg Oral BID  . ipratropium-albuterol  3 mL Nebulization TID  . metoprolol succinate  12.5 mg Oral QHS  . metoprolol succinate  25 mg Oral Daily  . potassium chloride  10 mEq Oral Daily  . [START ON 08/29/2015] predniSONE  60 mg Oral Q breakfast  . sodium chloride flush  3 mL Intravenous Q12H   Continuous Infusions: . sodium chloride 10 mL/hr (08/25/15 2305)    Principal Problem:   Acute respiratory failure with hypoxia (HCC) Active Problems:   Pacemaker   Paroxysmal atrial fibrillation (HCC)   COPD exacerbation (HCC)   Diastolic CHF (HCC)   Systemic hypertension   Hypokalemia   Anticoagulant long-term use   DM (dermatomyositis)   Acute on chronic diastolic CHF (congestive heart failure), NYHA class 1 ([place])    Time spent: 30 minutes    Barton Dubois  [facility] Pager [phone]. If 7PM-7AM, please contact night-coverage at [email or web address], password [facility] 08/28/2015, 6:12 PM  LOS: 2 days

## 2015-08-28 NOTE — Consult Note (Signed)
   [facility] CM Inpatient Consult   08/28/2015  [name] 04/12/1931 [id number]   Patient screened for [place] [facility] services and inpatient [facility] also requested for patient to be seen for [place] [facility] program. Spoke with patient at bedside. Discussed that she was recently active and declined further follow up from [place] [facility] in January 2017. Asked patient if she would like to re-enroll with [place] [facility] program. She pleasantly declined by stating "I just do not think it is necessary, I know what to do, and I call my doctor when needed". She also reports that she has a daughter who lives close by who assists her as needed. Discussed EMMI transition calls and she also declined that as well. Left brochure with contact information at bedside to call if she changes her mind in the future. Appreciative of visit. Will make inpatient RNCM aware that patient declined [place] [facility] and EMMI follow up.  Marthenia Rolling, MSN-Ed, RN,BSN [facility] Liaison [phone]

## 2015-08-29 DIAGNOSIS — Z950 Presence of cardiac pacemaker: Secondary | ICD-10-CM

## 2015-08-29 DIAGNOSIS — F411 Generalized anxiety disorder: Secondary | ICD-10-CM | POA: Insufficient documentation

## 2015-08-29 LAB — BASIC METABOLIC PANEL
ANION GAP: 4 — AB (ref 5–15)
BUN: 28 mg/dL — ABNORMAL HIGH (ref 6–20)
CALCIUM: 9 mg/dL (ref 8.9–10.3)
CO2: 35 mmol/L — ABNORMAL HIGH (ref 22–32)
Chloride: 100 mmol/L — ABNORMAL LOW (ref 101–111)
Creatinine, Ser: 0.64 mg/dL (ref 0.44–1.00)
Glucose, Bld: 116 mg/dL — ABNORMAL HIGH (ref 65–99)
Potassium: 4.3 mmol/L (ref 3.5–5.1)
SODIUM: 139 mmol/L (ref 135–145)

## 2015-08-29 LAB — BRAIN NATRIURETIC PEPTIDE: B NATRIURETIC PEPTIDE 5: 288.6 pg/mL — AB (ref 0.0–100.0)

## 2015-08-29 MED ORDER — FLUDROCORTISONE ACETATE 0.1 MG PO TABS: ORAL_TABLET | ORAL | Status: DC

## 2015-08-29 MED ORDER — POTASSIUM CHLORIDE CRYS ER 20 MEQ PO TBCR: 40.0000 meq | EXTENDED_RELEASE_TABLET | Freq: Every day | ORAL | Status: DC

## 2015-08-29 MED ORDER — IPRATROPIUM-ALBUTEROL 0.5-2.5 (3) MG/3ML IN SOLN: 3.0000 mL | Freq: Four times a day (QID) | RESPIRATORY_TRACT | Status: DC | PRN

## 2015-08-29 MED ORDER — GUAIFENESIN ER 600 MG PO TB12: 600.0000 mg | ORAL_TABLET | Freq: Two times a day (BID) | ORAL | Status: DC

## 2015-08-29 MED ORDER — PREDNISONE 20 MG PO TABS: 20.0000 mg | ORAL_TABLET | Freq: Every day | ORAL | Status: DC

## 2015-08-29 MED ORDER — PREDNISONE 20 MG PO TABS: ORAL_TABLET | ORAL | Status: DC

## 2015-08-29 MED ORDER — APIXABAN 2.5 MG PO TABS: 2.5000 mg | ORAL_TABLET | Freq: Two times a day (BID) | ORAL | Status: DC

## 2015-08-29 MED ORDER — FUROSEMIDE 20 MG PO TABS: 20.0000 mg | ORAL_TABLET | Freq: Every day | ORAL | Status: DC

## 2015-08-29 MED ORDER — IPRATROPIUM-ALBUTEROL 20-100 MCG/ACT IN AERS: 1.0000 | INHALATION_SPRAY | Freq: Four times a day (QID) | RESPIRATORY_TRACT | Status: DC | PRN

## 2015-08-29 MED ORDER — DOXYCYCLINE HYCLATE 100 MG PO TABS: 100.0000 mg | ORAL_TABLET | Freq: Two times a day (BID) | ORAL | Status: DC

## 2015-08-29 NOTE — Care Management Important Message (Signed)
Important Message  Patient Details  Name: [name] MRN: [id number] Date of Birth: 10-16-1930   Medicare Important Message Given:  Yes    Erenest Rasher, RN 08/29/2015, 11:07 AM

## 2015-08-29 NOTE — Progress Notes (Addendum)
SATURATION QUALIFICATIONS: (This note is used to comply with regulatory documentation for home oxygen)  Patient Saturations on Room Air at Rest = 91%  Patient Saturations on Room Air while Ambulating = 84%  Patient Saturations on 2Liters of oxygen while Ambulating = 96%

## 2015-08-29 NOTE — Care Management Note (Signed)
Case Management Note  Patient Details  Name: [name] MRN: [id number] Date of Birth: 1931/04/07  Subjective/Objective:   CHF                 Action/Plan: Discharge Planning: AVS reviewed NCM spoke to pt and lives home alone. States her dtr, [name] and son-in-law will assist as needed. Pt states she can drive to her appts. No additional DME needed. Offered choice for [place] list provided. Pt requested [facility]. Contacted [employer] rep for new referral for CHF Disease Mgmt program. Contacted [facility] for oxygen for home. Pt states she can afford her medications at home.   PCP-Dr Lajean Manes    Expected Discharge Date:  08/29/2015             Expected Discharge Plan:  [place]  In-House Referral:  NA  Discharge planning Services  CM Consult  Post Acute Care Choice:  Home Health Choice offered to:  Patient  DME Arranged:  Oxygen DME Agency:  [facility]  HH Arranged:  RN, Disease Management HH Agency:  [facility]  Status of Service:  Completed, signed off  Medicare Important Message Given:  Yes Date Medicare IM Given:    Medicare IM give by:    Date Additional Medicare IM Given:    Additional Medicare Important Message give by:     If discussed at [place] of Stay Meetings, dates discussed:    Additional Comments:  Erenest Rasher, RN 08/29/2015, 11:37 AM

## 2015-08-29 NOTE — Discharge Summary (Signed)
Physician Discharge Summary  [name] [id number] DOB: 07/16/1930 DOA: 08/25/2015  PCP: Mathews Argyle, MD  Admit date: 08/25/2015 Discharge date: 08/29/2015  Time spent: 40 minutes  Recommendations for Outpatient Follow-up:  1. Repeat BMEt to follow electrolytes and renal function  2. Follow volume status and adjsut diuretics as needed 3. Outpatient PFT's and initiation of maintenance inhalers for her COPD 4. Follow BP, as patient started on low dose of lasix and florinef adjusted (?? Adrenal insufficiency component)  5. Weaned oxygen as tolerated; at discharge requiring O2 supplementation on exertion    Discharge Diagnoses:  Principal Problem:   Acute respiratory failure with hypoxia (HCC) Active Problems:   Pacemaker   Paroxysmal atrial fibrillation (HCC)   COPD exacerbation (HCC)   Diastolic CHF (HCC)   Systemic hypertension   Hypokalemia   Anticoagulant long-term use   DM (dermatomyositis)   Acute on chronic diastolic CHF (congestive heart failure), NYHA class 1 ([place])   Discharge Condition: stable and improved. Discharge home with home health services. Follow up with PCP in 10 days.  Diet recommendation: heart healthy diet   Filed Weights   08/27/15 0443 08/28/15 0500 08/29/15 0658  Weight: 51.438 kg (113 lb 6.4 oz) 50.984 kg (112 lb 6.4 oz) 51.211 kg (112 lb 14.4 oz)    History of present illness:  As per H&P written by Dr. Arnoldo Morale on 08/25/15 82 y.o. female with a history of COPD, Diastolic CHF, Paroxsymal Atrial Fibrillation on Eliquis Rx, SSS and CHB S/P Pacemaker, HTN, and Dermatomyositis who presents to the ED with complaints of increased SOB and Wheezes 2 hours before arrival to the ED. She denies any Chest pain. She was found to have hypoxia to the 83% and was placed on 2 liters NCO2. Her chest X-Ray revealed COPD changes. She was administered Neb Treatments, IV Solumedrol, and IV Magnesium and had mild improvement. She was also given IV Lasix  x 1. She was then referred for admission.   Of Note: she reports being taken off her Lasix Rx about 1 week ago due to Hypokalemia.   Hospital Course:  1-acute resp failure with hypoxia: multifactorial  -due to COPD and CHF exacerbation -will transition to PO lasix (low dose daily), prednisone tapering, antibiotics, PRN combivent, continue use flutter valve and mucinex -will benefit of PFT's as an outpatient and initiation of maintenance therapy for her COPD -daily weight, low sodium diet and strict I's and O's -2-D echo with preserved EF and no wall motion abnormalities  -continue oxygen supplementation and weaned as tolerated    2-acute on chronic diastolic heart failure  -treatment as mentioned above -continue lopressor, heart healthy diet and daily weights -Will plan to discharge on Lasix 20 mg by mouth daily.  -low sodium diet encouraged and Heart healthy home health orders arranged at discharge   3-orthostatic hypotension:   -continue florinef, dose adjusted for better control; as she will benefit of volume management with lasix  -steroids for COPD also helping -BP stable at discharge -?? Adrenal insufficiency component   4-depression: -will continue lexapro  5-paroxysmal A. Fib: also with hx of SSS/complete heart block -s/p pacemaker -continue lopressor for rate control -CHADsVASC score 3 -continue eliquis 2.5mg  BID (dose adjusted for her age)  6-hypokalemia: due to diuresis along use of albuterol  -repleted and WNL at discharge supplementation adjusted as patient discharge on la+six   7-anxiety: -continue PRN xanax   Procedures:  2-D echo - Left ventricle: The cavity size was normal. There was mild  concentric hypertrophy. Systolic function was normal. The  estimated ejection fraction was in the range of 60% to 65%. Wall  motion was normal; there were no regional wall motion  abnormalities. Doppler parameters are consistent with abnormal  left  ventricular relaxation (grade 1 diastolic dysfunction).  Doppler parameters are consistent with elevated ventricular  end-diastolic filling pressure. - Aortic valve: Trileaflet; mildly thickened, mildly calcified  leaflets. There was mild stenosis. There was moderate  regurgitation. Mean gradient (S): 8 mm Hg. Peak gradient (S): 17  mm Hg. Valve area (VTI): 2.11 cm^2. Valve area (Vmax): 2.19 cm^2.  Valve area (Vmean): 2.11 cm^2. - Aortic root: The aortic root was normal in size. - Mitral valve: Structurally normal valve. There was moderate  regurgitation. - Left atrium: The atrium was mildly dilated. - Right ventricle: Systolic function was normal. - Right atrium: The atrium was mildly dilated. - Tricuspid valve: There was moderate regurgitation. - Pulmonary arteries: Systolic pressure was moderately increased.  PA peak pressure: 50 mm Hg (S). - Inferior vena cava: The vessel was dilated. The respirophasic  diameter changes were blunted (< 50%), consistent with elevated  central venous pressure. - Pericardium, extracardiac: There was no pericardial effusion.  Consultations:  None   Discharge Exam: Filed Vitals:   08/28/15 2044 08/29/15 0658  BP: 129/60 163/72  Pulse: 79 75  Temp: 98.5 F (36.9 C) 97.9 F (36.6 C)  Resp: 20 20    General: afebrile, breathing A lot better; with just minimal wheezing on exam and able to speak in full sentences. Patient is still requiring O2 supplementation for exertion.  Cardiovascular: no murmur, no rubs, no gallops; no JVD appreciated on exam  Respiratory: very mild wheezing, scattered rhonchi and no frank crackles  Abdomen: soft, NT, ND, positive BS  Musculoskeletal: no edema, no cyanosis   Discharge Instructions   Discharge Instructions    Diet - low sodium heart healthy    Complete by:  As directed      Discharge instructions    Complete by:  As directed   Take medications as prescribed Please arrange follow up with  PCP in 10 days Follow heart healthy diet (less than 2 gram daily) Maintain adequate hydration  Oxygen supplementation as instructed (especially on exertion) Check your weight on daily basis          Current Discharge Medication List    START taking these medications   Details  doxycycline (VIBRA-TABS) 100 MG tablet Take 1 tablet (100 mg total) by mouth every 12 (twelve) hours. Qty: 14 tablet, Refills: 0    guaiFENesin (MUCINEX) 600 MG 12 hr tablet Take 1 tablet (600 mg total) by mouth 2 (two) times daily. Qty: 40 tablet, Refills: 0    Ipratropium-Albuterol (COMBIVENT RESPIMAT) 20-100 MCG/ACT AERS respimat Inhale 1 puff into the lungs every 6 (six) hours as needed for wheezing or shortness of breath. Qty: 1 Inhaler, Refills: 2    potassium chloride (K-DUR,KLOR-CON) 20 MEQ tablet Take 2 tablets (40 mEq total) by mouth daily. Qty: 60 tablet, Refills: 2      CONTINUE these medications which have CHANGED   Details  apixaban (ELIQUIS) 2.5 MG TABS tablet Take 1 tablet (2.5 mg total) by mouth 2 (two) times daily. Qty: 60 tablet, Refills: 1    fludrocortisone (FLORINEF) 0.1 MG tablet 0.2 mg in am and 0.1 mg at bedtime    furosemide (LASIX) 20 MG tablet Take 1 tablet (20 mg total) by mouth daily. Qty: 30 tablet, Refills: 1    !!  predniSONE (DELTASONE) 20 MG tablet Take 1 tablet (20 mg total) by mouth daily. Resume when steroids tapering is completed    !! predniSONE (DELTASONE) 20 MG tablet Take 3 tablets by mouth daily X 1 days; then 2 tablets by mouth daily X 3 days; then resume 20 mg tablet daily Qty: 10 tablet, Refills: 0     !! - Potential duplicate medications found. Please discuss with provider.    CONTINUE these medications which have NOT CHANGED   Details  ALPRAZolam (XANAX) 0.25 MG tablet Take 0.25 mg by mouth at bedtime.     escitalopram (LEXAPRO) 10 MG tablet TK 1 T PO QD Refills: 6    metoprolol succinate (TOPROL-XL) 25 MG 24 hr tablet Take 12.5-25 tablets by  mouth 2 (two) times daily. Take 1 tablet in the morning and 1/2 tablet in the evening Refills: 3    alendronate (FOSAMAX) 70 MG tablet TK 1 T PO WEEKLY Refills: 12      STOP taking these medications     Potassium Chloride CR (MICRO-K) 8 MEQ CPCR capsule CR      levofloxacin (LEVAQUIN) 750 MG tablet        No Known Allergies Follow-up Information    Follow up with [facility].   Why:  will deliver your oxygen to home once you arrive   Contact information:   [address] [place] [address] [phone]       Follow up with [facility].   Why:  Home Health RN   Contact information:   [phone]      Follow up with Mathews Argyle, MD. Schedule an appointment as soon as possible for a visit in 10 days.   Specialty:  Internal Medicine   Contact information:   [address] [place] [address] [phone]       The results of significant diagnostics from this hospitalization (including imaging, microbiology, ancillary and laboratory) are listed below for reference.    Significant Diagnostic Studies: Dg Chest Port 1 View  08/25/2015  CLINICAL DATA:  Short of breath EXAM: PORTABLE CHEST 1 VIEW COMPARISON:  07/08/2015 FINDINGS: COPD with pulmonary hyperinflation. Heart size upper normal. Dual lead pacemaker unchanged. Negative for heart failure. No edema or effusion. Negative for mass or pneumonia. IMPRESSION: COPD without acute abnormality. Electronically Signed   By: Franchot Gallo M.D.   On: 08/25/2015 22:41   Labs: Basic Metabolic Panel:  Recent Labs Lab 08/25/15 2217 08/26/15 0509 08/27/15 0438 08/28/15 0417 08/29/15 0534  NA 142 142 141 140 139  K 3.2* 3.1* 3.7 4.2 4.3  CL 98* 97* 94* 96* 100*  CO2 34* 30 38* 35* 35*  GLUCOSE 138* 168* 102* 138* 116*  BUN 16 15 16  25* 28*  CREATININE 0.82 0.80 0.74 0.65 0.64  CALCIUM 8.7* 8.3* 8.5* 8.9 9.0   CBC:  Recent Labs Lab 08/26/15 08/26/15 0509   WBC 12.2* 12.2*  HGB 13.2 11.9*  HCT 40.2 37.2  MCV 89.1 91.0  PLT 281 254   Cardiac Enzymes:  Recent Labs Lab 08/25/15 2217  TROPONINI 0.03   BNP: BNP (last 3 results)  Recent Labs  06/07/15 0205 08/25/15 2217 08/29/15 0538  BNP 536.4* 774.8* 288.6*   Signed:  Barton Dubois MD.  [facility] 08/29/2015, 12:58 PM

## 2015-08-30 NOTE — Progress Notes (Signed)
Faxed insurance information to [facility], [phone].Jonnie Finner RN CCM Case Mgmt phone [phone]

## 2015-08-31 ENCOUNTER — Telehealth: Payer: Self-pay | Admitting: Cardiovascular Disease

## 2015-08-31 DIAGNOSIS — Z79899 Other long term (current) drug therapy: Secondary | ICD-10-CM

## 2015-08-31 DIAGNOSIS — Z9181 History of falling: Secondary | ICD-10-CM | POA: Diagnosis not present

## 2015-08-31 DIAGNOSIS — M810 Age-related osteoporosis without current pathological fracture: Secondary | ICD-10-CM | POA: Diagnosis not present

## 2015-08-31 DIAGNOSIS — M353 Polymyalgia rheumatica: Secondary | ICD-10-CM | POA: Diagnosis not present

## 2015-08-31 DIAGNOSIS — I480 Paroxysmal atrial fibrillation: Secondary | ICD-10-CM | POA: Diagnosis not present

## 2015-08-31 DIAGNOSIS — J441 Chronic obstructive pulmonary disease with (acute) exacerbation: Secondary | ICD-10-CM | POA: Diagnosis not present

## 2015-08-31 DIAGNOSIS — Z792 Long term (current) use of antibiotics: Secondary | ICD-10-CM | POA: Diagnosis not present

## 2015-08-31 DIAGNOSIS — I5032 Chronic diastolic (congestive) heart failure: Secondary | ICD-10-CM | POA: Diagnosis not present

## 2015-08-31 DIAGNOSIS — I951 Orthostatic hypotension: Secondary | ICD-10-CM | POA: Diagnosis not present

## 2015-08-31 DIAGNOSIS — Z7952 Long term (current) use of systemic steroids: Secondary | ICD-10-CM | POA: Diagnosis not present

## 2015-08-31 DIAGNOSIS — J440 Chronic obstructive pulmonary disease with acute lower respiratory infection: Secondary | ICD-10-CM | POA: Diagnosis not present

## 2015-08-31 DIAGNOSIS — I110 Hypertensive heart disease with heart failure: Secondary | ICD-10-CM | POA: Diagnosis not present

## 2015-08-31 DIAGNOSIS — F419 Anxiety disorder, unspecified: Secondary | ICD-10-CM | POA: Diagnosis not present

## 2015-08-31 DIAGNOSIS — Z950 Presence of cardiac pacemaker: Secondary | ICD-10-CM | POA: Diagnosis not present

## 2015-08-31 DIAGNOSIS — F329 Major depressive disorder, single episode, unspecified: Secondary | ICD-10-CM | POA: Diagnosis not present

## 2015-08-31 DIAGNOSIS — I495 Sick sinus syndrome: Secondary | ICD-10-CM | POA: Diagnosis not present

## 2015-08-31 NOTE — Telephone Encounter (Signed)
Called patient as no DPR is on file and Abigail Butts is not listed as an emergency contact. She states Abigail Butts is helping her as her daughter recently had knee replacement.   Patient was in the hospital and was started on potassium and her eliquis was reduced from 5mg  BID to 2.5mg  BID. She states her PCP advised her to contact cardiologist to check on this. Informed her that these changes are OK and that I will forward to the MD as an FYI.   She is due for an appt with Dr. Loletha Grayer in April - message sent to scheduler to contact patient for this appt.

## 2015-08-31 NOTE — Telephone Encounter (Signed)
New message: Please call,she needs to talk to you about her Eliquis and her Potassium.

## 2015-08-31 NOTE — Addendum Note (Signed)
Addended by: Fidel Levy on: 08/31/2015 01:48 PM   Modules accepted: Orders

## 2015-08-31 NOTE — Telephone Encounter (Signed)
She has lost weight and the Eliquis dose change is appropriate for current weight. Will check potassium level in about 2 weeks and will discuss whether she needs to stay on it long term at her upcoming appointment. Please order a BEMT in 2 wks.

## 2015-08-31 NOTE — Telephone Encounter (Signed)
Returned call to patient and communicated MD advice/info. She voiced understanding. Repeat BMET ordered and lab slips mailed to patient.

## 2015-09-01 LAB — CUP PACEART REMOTE DEVICE CHECK
Battery Remaining Longevity: 43 mo
Brady Statistic AP VP Percent: 90 %
Brady Statistic AP VS Percent: 1 %
Brady Statistic AS VS Percent: 1 %
Brady Statistic RV Percent Paced: 99 %
Implantable Lead Implant Date: 19920213
Implantable Lead Implant Date: 19920213
Implantable Lead Location: 753860
Implantable Lead Serial Number: 23000302
Lead Channel Pacing Threshold Amplitude: 0.625 V
Lead Channel Pacing Threshold Pulse Width: 0.4 ms
Lead Channel Sensing Intrinsic Amplitude: 12 mV
Lead Channel Setting Pacing Amplitude: 0.875
Lead Channel Setting Pacing Amplitude: 2.25 V
MDC IDC LEAD LOCATION: 753859
MDC IDC MSMT BATTERY REMAINING PERCENTAGE: 40 %
MDC IDC MSMT BATTERY VOLTAGE: 2.84 V
MDC IDC MSMT LEADCHNL RA IMPEDANCE VALUE: 360 Ohm
MDC IDC MSMT LEADCHNL RA SENSING INTR AMPL: 2.6 mV
MDC IDC MSMT LEADCHNL RV IMPEDANCE VALUE: 430 Ohm
MDC IDC PG SERIAL: 2314326
MDC IDC SESS DTM: 20170217001319
MDC IDC SET LEADCHNL RV PACING PULSEWIDTH: 0.4 ms
MDC IDC SET LEADCHNL RV SENSING SENSITIVITY: 4 mV
MDC IDC STAT BRADY AS VP PERCENT: 9.6 %
MDC IDC STAT BRADY RA PERCENT PACED: 85 %

## 2015-09-02 ENCOUNTER — Encounter: Payer: Self-pay | Admitting: Cardiology

## 2015-09-07 ENCOUNTER — Other Ambulatory Visit: Payer: Self-pay

## 2015-09-07 VITALS — Ht 72.0 in | Wt 107.0 lb

## 2015-09-07 DIAGNOSIS — Z79899 Other long term (current) drug therapy: Secondary | ICD-10-CM | POA: Diagnosis not present

## 2015-09-07 DIAGNOSIS — I5031 Acute diastolic (congestive) heart failure: Secondary | ICD-10-CM

## 2015-09-07 DIAGNOSIS — I480 Paroxysmal atrial fibrillation: Secondary | ICD-10-CM | POA: Diagnosis not present

## 2015-09-07 DIAGNOSIS — I10 Essential (primary) hypertension: Secondary | ICD-10-CM | POA: Diagnosis not present

## 2015-09-07 DIAGNOSIS — J449 Chronic obstructive pulmonary disease, unspecified: Secondary | ICD-10-CM | POA: Diagnosis not present

## 2015-09-07 DIAGNOSIS — I5030 Unspecified diastolic (congestive) heart failure: Secondary | ICD-10-CM | POA: Diagnosis not present

## 2015-09-07 NOTE — Patient Outreach (Signed)
[place] [facility]) Care Management  [place]  09/07/2015   [name] 12-23-1930 [id number]   Referral Date:  09/07/2015 Source:  EMMI HF Program Red on Emmi Day #6:  Sad, hopeless, empty? Yes Patient is eligible for [facility] CM service.   Outreach call #1 to patient. Screening and Emmi Red Alert assessed.   Subjective:  Providers:  Primary MD: Dr. Felipa Eth - last appt 09/07/15 Cardiologist: yes HH:  RN   - [place] [phone] Insurance: Medicare  Social: Patient lives in her home alone.  Husband passed several years ago and this is why she answered yes to the emmi question.  Mobility: States ambulates with no assistance.  Falls: none  Pain: none  Transportation: Daughter  Caregiver: daughter DME:  Scales, Oxygen ([place] [phone])  THN conditions: CHF, HTN, COPD Acute on chronic diastolic CHF (congestive heart failure), NYHA class 1 Admission history: 2 08/25/2015 - 08/29/2015   Acute respiratory failure with hypoxia, CHF Patient states she never knew she had CHF.  States this is all news to her and she knows nothing about it.  Patient states she has been sleeping with oxygen at night but Dr. Felipa Eth released her today and will have oxygen picked up.  States she had one RN visit since home and was advised they would get HF scales set up for patient but was out at the time.  States no one has been back to check her oxygen or weight since that visit.  Patient confirms she does have scales in the home.   Medications:  Less than 15 medications: yes Co-pay cost issues: none Flu vaccine 03/16/2015  Consent: Patient agreed to [facility] services.   Objective:   Current Medications:  Current Outpatient Prescriptions  Medication Sig Dispense Refill  . alendronate (FOSAMAX) 70 MG tablet TK 1 T PO WEEKLY  12  . ALPRAZolam (XANAX) 0.25 MG tablet Take 0.25 mg by mouth at bedtime.     [name] apixaban (ELIQUIS) 2.5 MG TABS  tablet Take 1 tablet (2.5 mg total) by mouth 2 (two) times daily. 60 tablet 1  . doxycycline (VIBRA-TABS) 100 MG tablet Take 1 tablet (100 mg total) by mouth every 12 (twelve) hours. 14 tablet 0  . escitalopram (LEXAPRO) 10 MG tablet TK 1 T PO QD  6  . fludrocortisone (FLORINEF) 0.1 MG tablet 0.2 mg in am and 0.1 mg at bedtime    . furosemide (LASIX) 20 MG tablet Take 1 tablet (20 mg total) by mouth daily. 30 tablet 1  . guaiFENesin (MUCINEX) 600 MG 12 hr tablet Take 1 tablet (600 mg total) by mouth 2 (two) times daily. 40 tablet 0  . Ipratropium-Albuterol (COMBIVENT RESPIMAT) 20-100 MCG/ACT AERS respimat Inhale 1 puff into the lungs every 6 (six) hours as needed for wheezing or shortness of breath. 1 Inhaler 2  . metoprolol succinate (TOPROL-XL) 25 MG 24 hr tablet Take 12.5-25 tablets by mouth 2 (two) times daily. Take 1 tablet in the morning and 1/2 tablet in the evening  3  . potassium chloride (K-DUR,KLOR-CON) 20 MEQ tablet Take 2 tablets (40 mEq total) by mouth daily. 60 tablet 2  . predniSONE (DELTASONE) 20 MG tablet Take 1 tablet (20 mg total) by mouth daily. Resume when steroids tapering is completed    . predniSONE (DELTASONE) 20 MG tablet Take 3 tablets by mouth daily X 1 days; then 2 tablets by mouth daily X 3 days; then resume 20 mg tablet daily (Patient  not taking: Reported on 09/07/2015) 10 tablet 0   No current facility-administered medications for this visit.    Functional Status:  In your present state of health, do you have any difficulty performing the following activities: 08/26/2015 06/07/2015  Hearing? N N  Vision? N N  Difficulty concentrating or making decisions? N N  Walking or climbing stairs? N N  Dressing or bathing? N N  Doing errands, shopping? N N    Fall/Depression Screening: PHQ 2/9 Scores 09/07/2015 06/16/2015  PHQ - 2 Score 2 0  PHQ- 9 Score 2 -    Assessment: Patient with new CHF diagnosis awareness.  Patient in need of CHF education and self management  education.   Plan: Referral Date:  09/07/15 Screening: 09/07/2015 CM start date: 09/07/2015    [facility] RN CM Referral  -Transition of care - admission within the past 30 days.  -New CHF diagnosis  RN CM reviewed signs/symptoms of HF such as swelling in hands, feet, abdominal girth; increased weight; report any of these symptoms to MD.   RN CM advised in next contact call within next 10 business days.  RN CM advised to please notify MD of any changes in condition prior to scheduled appt's.  RN CM provided contact name and # [phone] or main office # [phone] and 24-hour nurse line # [phone].  RN CM confirmed patient is aware of 911 services for urgent emergency needs.  Mariann Laster, RN, BSN, [facility], CCM  [facility] [employer] Management Coordinator [phone] Direct [phone] Cell [phone] Office [phone] Fax

## 2015-09-08 ENCOUNTER — Other Ambulatory Visit: Payer: Self-pay

## 2015-09-08 NOTE — Patient Outreach (Signed)
[place] [facility]) Care Management  09/08/2015  [name] September 23, 1930 [id number]  Assessment: Referral per telephonic care coordinator-Member with recent admission 3/7-3/11 for respiratory failure, acute on chronic heart failure. Call for transition of care. No answer. HIPPA compliant message left.  Plan: follow up call tomorrow.  [name], RN, MSN, [place] Coordinator Cell: [phone]

## 2015-09-09 ENCOUNTER — Other Ambulatory Visit: Payer: Self-pay

## 2015-09-09 DIAGNOSIS — I110 Hypertensive heart disease with heart failure: Secondary | ICD-10-CM | POA: Diagnosis not present

## 2015-09-09 DIAGNOSIS — F329 Major depressive disorder, single episode, unspecified: Secondary | ICD-10-CM | POA: Diagnosis not present

## 2015-09-09 DIAGNOSIS — I480 Paroxysmal atrial fibrillation: Secondary | ICD-10-CM | POA: Diagnosis not present

## 2015-09-09 DIAGNOSIS — J441 Chronic obstructive pulmonary disease with (acute) exacerbation: Secondary | ICD-10-CM | POA: Diagnosis not present

## 2015-09-09 DIAGNOSIS — I951 Orthostatic hypotension: Secondary | ICD-10-CM | POA: Diagnosis not present

## 2015-09-09 DIAGNOSIS — I5032 Chronic diastolic (congestive) heart failure: Secondary | ICD-10-CM | POA: Diagnosis not present

## 2015-09-10 ENCOUNTER — Other Ambulatory Visit: Payer: Self-pay | Admitting: *Deleted

## 2015-09-10 NOTE — Patient Outreach (Signed)
Covering for case manager Erskin Burnet, notified by care management assistant that the member triggered red on EMMI heart failure dashboard, responded no when asked if member had filled new prescriptions.  Call placed to member, introduced self and purpose of call, identity verified.  Member denies any trouble breathing, and states that she has been taking all medications as prescribed.  She reports seeing her physician last week, stating there was no concern regarding her medicine.  Attempted to verify medications individually, particularly the new ones since discharge, she states "somebody called yesterday, I just went over everything.  I am taking everything I am supposed to be taking."  She denies other concerns, thanks this care manager for the call.  Member made aware that assigned care manager will follow up upon return.  Valente David, BSN, [place] Management  [facility] Care Manager [phone]

## 2015-09-10 NOTE — Patient Outreach (Signed)
[place] [facility]) Care Management  09/10/2015  [name] [birth year] [id number]   Patient triggered RED on EMMI Heart Failure Dashboard, notification sent to [name], RN [facility] [place]).  Thanks, [name]. [place], [place] Assistant Phone: [phone] Fax: [phone]

## 2015-09-11 ENCOUNTER — Other Ambulatory Visit: Payer: Self-pay

## 2015-09-11 NOTE — Patient Outreach (Signed)
[place] [facility]) Care Management  09/11/2015  [name] [birth year] [id number]  Late entry for 09/09/15 1458- RNCM called for transition of care-82 year old with recent admission for acute respiratory failure/COPD and acute on chronic heart failure. Member reports she was not aware that she had heart failure diagnosis. Reports she has not been weighing self.  Plan: home visit next week.  [name], RN, MSN, [place] Coordinator Cell: [phone]

## 2015-09-11 NOTE — Patient Outreach (Signed)
[place] [facility]) Care Management  09/11/2015  [name] December 20, 1930 [id number]  Received notification: Heart failure- EMMI RED call on yesterday. However, member was called and needs assessed by covering Care Coordinator, [facility] following this call. No concerns/issues noted during the assessment, therefore no further action needed regarding EMMI Red call.  [name], RN, MSN, [place] Coordinator Cell: [phone]

## 2015-09-15 DIAGNOSIS — J438 Other emphysema: Secondary | ICD-10-CM | POA: Diagnosis not present

## 2015-09-17 ENCOUNTER — Other Ambulatory Visit: Payer: Self-pay

## 2015-09-17 DIAGNOSIS — I951 Orthostatic hypotension: Secondary | ICD-10-CM | POA: Diagnosis not present

## 2015-09-17 DIAGNOSIS — J441 Chronic obstructive pulmonary disease with (acute) exacerbation: Secondary | ICD-10-CM | POA: Diagnosis not present

## 2015-09-17 DIAGNOSIS — I110 Hypertensive heart disease with heart failure: Secondary | ICD-10-CM | POA: Diagnosis not present

## 2015-09-17 DIAGNOSIS — I480 Paroxysmal atrial fibrillation: Secondary | ICD-10-CM | POA: Diagnosis not present

## 2015-09-17 DIAGNOSIS — F329 Major depressive disorder, single episode, unspecified: Secondary | ICD-10-CM | POA: Diagnosis not present

## 2015-09-17 DIAGNOSIS — I5032 Chronic diastolic (congestive) heart failure: Secondary | ICD-10-CM | POA: Diagnosis not present

## 2015-09-17 NOTE — Patient Outreach (Addendum)
[place] [facility]) Care Management  [place]  09/17/2015   ERNESTINA FLASH 1930/11/23 [id number]  Subjective: member with no questions or concerns at this time. Denies difficulty breathing  Objective: BP 146/77 mmHg  Pulse 75  Resp 20  Ht 1.651 m (5\' 5" )  Wt 115 lb (52.164 kg)  BMI 19.14 kg/m2  SpO2 97%, lungs clear/decreased, heart rate regular.   Current Medications:  Current Outpatient Prescriptions  Medication Sig Dispense Refill  . alendronate (FOSAMAX) 70 MG tablet TK 1 T PO WEEKLY  12  . ALPRAZolam (XANAX) 0.25 MG tablet Take 0.25 mg by mouth at bedtime.     [name] apixaban (ELIQUIS) 2.5 MG TABS tablet Take 1 tablet (2.5 mg total) by mouth 2 (two) times daily. 60 tablet 1  . escitalopram (LEXAPRO) 10 MG tablet TK 1 T PO QD  6  . fludrocortisone (FLORINEF) 0.1 MG tablet 0.2 mg in am and 0.1 mg at bedtime    . furosemide (LASIX) 20 MG tablet Take 1 tablet (20 mg total) by mouth daily. 30 tablet 1  . Ipratropium-Albuterol (COMBIVENT RESPIMAT) 20-100 MCG/ACT AERS respimat Inhale 1 puff into the lungs every 6 (six) hours as needed for wheezing or shortness of breath. 1 Inhaler 2  . metoprolol succinate (TOPROL-XL) 25 MG 24 hr tablet Take 12.5-25 tablets by mouth 2 (two) times daily. Take 1 tablet in the morning and 1/2 tablet in the evening  3  . potassium chloride (K-DUR,KLOR-CON) 20 MEQ tablet Take 2 tablets (40 mEq total) by mouth daily. 60 tablet 2  . predniSONE (DELTASONE) 20 MG tablet Take 1 tablet (20 mg total) by mouth daily. Resume when steroids tapering is completed    . doxycycline (VIBRA-TABS) 100 MG tablet Take 1 tablet (100 mg total) by mouth every 12 (twelve) hours. (Patient not taking: Reported on 09/09/2015) 14 tablet 0  . guaiFENesin (MUCINEX) 600 MG 12 hr tablet Take 1 tablet (600 mg total) by mouth 2 (two) times daily. (Patient not taking: Reported on 09/09/2015) 40 tablet 0  . predniSONE (DELTASONE) 20 MG tablet Take 3 tablets by mouth daily X 1  days; then 2 tablets by mouth daily X 3 days; then resume 20 mg tablet daily (Patient not taking: Reported on 09/17/2015) 10 tablet 0   No current facility-administered medications for this visit.    Functional Status:  In your present state of health, do you have any difficulty performing the following activities: 09/17/2015 08/26/2015  Hearing? N N  Vision? N N  Difficulty concentrating or making decisions? N N  Walking or climbing stairs? N N  Dressing or bathing? N N  Doing errands, shopping? N N  Preparing Food and eating ? N -  Using the Toilet? N -  In the past six months, have you accidently leaked urine? N -  Do you have problems with loss of bowel control? N -  Managing your Medications? N -  Managing your Finances? N -  Housekeeping or managing your Housekeeping? N -    Fall/Depression Screening: PHQ 2/9 Scores 09/07/2015 06/16/2015  PHQ - 2 Score 2 0  PHQ- 9 Score 2 -   Fall Risk  09/17/2015 09/07/2015 06/16/2015  Falls in the past year? No No No     Assessment: 82 year old with recent admission 3/7-3/11 for acute respiratory failure with hypoxia/acute on chronic heart failure. Present during home visit, member's daughter and Member's daughter. Member denies shortness of breath. No edema.  Remains active with  home health-[facility]. Nurse Lenna Sciara present during visit. Tele-monitoring equipment set up and patient instructed per Ranelle Oyster, Home health nurse.   RNCM provided contact information. Member encouraged to call as needed.  Plan: telephonic transition of care call next week. THN CM Care Plan Problem One        Most Recent Value   THN CM Short Term Goal #1 (0-30 days)  member will weigh self dialy within the next 30 days.   THN CM Short Term Goal #1 Start Date  09/09/15   Interventions for Short Term Goal #1  telemonitoring scales/equipment set up and member instructed on how to use per home health.    THN CM Care Plan Problem Two        Most Recent Value    Care Plan Problem Two  knowledge deficit related to heart failure management.   Role Documenting the Problem Two  Care Management [place] for Problem Two  Active   Interventions for Problem Two Long Term Goal   reviewed the heart failure zone tool, encouraged to weigh and record weights.   THN Long Term Goal (31-90) days  member will verbalize at least three strategies for heart failure managment within the next 45-90 days.   [place] Long Term Goal Start Date  09/17/15     [name], RN, MSN, [place] Coordinator Cell: [phone]

## 2015-09-18 ENCOUNTER — Other Ambulatory Visit: Payer: Self-pay | Admitting: *Deleted

## 2015-09-18 NOTE — Patient Outreach (Signed)
[place] [facility]) Care Management  09/18/2015  [name] 1930-07-08 [id number]   Patient triggered RED on EMMI Heart Failure dashboard, notification sent to [name], RN.  Thanks, [name]. [place], [place] Assistant Phone: [phone] Fax: [phone]

## 2015-09-18 NOTE — Patient Outreach (Signed)
Covering for assigned care manager, J. Juleen China.  Notified by care management assistant that member appeared red on EMMI heart failure dashboard, indicating worse/new problems or new/worsening shortness of breath.  Call placed to member as a follow up, she states she is fine.  "I don't know where they got that.  I'm not having any problems."  She denies shortness of breath and any other new problems.  She reports her weight has been consistent, denies concerns.  Will notify assigned care management assistant of contact.  Valente David, BSN, [place] Management  [facility] Care Manager [phone]

## 2015-09-21 NOTE — Progress Notes (Signed)
Patient ID: ABIHAIL COALE, female   DOB: 09/08/1930, 82 y.o.   MRN: [id number]    Cardiology Office Note    Date:  09/22/2015   ID:  KONA HOLLIMON, DOB [birth year], MRN [id number]  PCP:  Mathews Argyle, MD  Cardiologist:  Sanda Klein, MD   Chief Complaint  Patient presents with  . Annual Exam    patient reports no complaints since leaving the hospital    History of Present Illness:  AMEENA FLEMINGS is a 82 y.o. female with complete heart block, sinus node dysfunction and pacemaker documentation of asymptomatic paroxysmal atrial fibrillation, diastolic heart failure and mild aortic inufficiency.  Mrs. Waage was hospitalized twice over the winter with pneumonia. During that time she had a lot more atrial fibrillation than usual. Following her last hospital discharge she quit smoking about 3 weeks ago. Her cough has improved substantially and she denies exertional dyspnea. She continued to lose a lot of weight and at one point was down 207 pounds. She has gained back some weight 212 pounds as of her hospital discharge on March 11. Today on our office scale she weighs 116 pounds.  She has had problems with both severe orthostatic hypotension with symptoms of dizziness, but also dyspnea and echo evidence of diastolic dysfunction with elevated left ventricular end-diastolic pressure and moderate pulmonary hypertension. She is taking a very unusual combination of fludrocortisone and furosemide which seems to be contradictory. She has also been taking prednisone for polymyalgia rheumatica.  She is taking Eliquis and has not had any bleeding problems or neurological events.  She describes multiple symptoms of depression including anhedonia, poor appetite, isolation. This all seems to date back to her husband's demise.  Interrogation of her pacemaker shows lengthy episodes of atrial fibrillation in December 2016, when she was hospitalized with pneumonia. The burden of atrial fibrillation  has been 4.6% Otherwise,  she has 84 % atrial pacing and virtually 100% ventricular pacing. There are no detectable R waves. She has complete heart block and is pacemaker dependent. Battery longevity is around 3.4-3.9 years.   She has a history of orthostatic hypotension, sinus node dysfunction and complete heart block, status post dual-chamber pacemaker, diabetes mellitus controlled with diet and hyperlipidemia. She has had varied estimation of the severity of her aortic insufficiency by echocardiography but has a normal size left ventricle with normal left ventricular function and no other significant valvular abnormalities. Most recent echocardiogram showed only mild aortic insufficiency.   Her original pacemaker and the current leads were implanted in 1992. She has had 2 generator change out procedures, most recently in 2010. Her device is a Buyer, retail 2110 non RF dual-chamber pacemaker. The atrial lead is a St. Jude [id number] and the ventricular lead was a Biotronik [id number].    Past Medical History  Diagnosis Date  . Syncope   . COPD (chronic obstructive pulmonary disease) ([place])   . DM (dermatomyositis)   . Orthostatic hypotension   . Systemic hypertension   . Thoracic kyphosis   . Scoliosis   . Pacemaker 07/13/2013    Her original pacemaker and the current leads were implanted in 1992. She has had 2 generator change out, most recently in 2010. Her device is a Buyer, retail 2110 non RF dual-chamber pacemaker with a battery longevity estimated at about 7 years. The atrial lead is a St. Jude [id number] and the ventricular lead was a Biotronik [id number].   [name] CHB (complete heart block) ([place]) 07/13/2013    Pacemaker  dependent  . SSS (sick sinus syndrome) ([place]) 07/13/2013  . Paroxysmal atrial fibrillation ([place]) 09/04/2014  . CHF (congestive heart failure) [facility])     Past Surgical History  Procedure Laterality Date  . Permanent pacemaker generator change  03/27/2009    St.Jude  . Nm myocar perf wall  motion  09/13/2011    Low risk  . US echocardiography  03/28/2012    Mod LAE,mild MR,aortic sclerosis w/mod AI,mod. TR,mild PI,Stage I diastolic dysfunction    Current Medications: Outpatient Prescriptions Prior to Visit  Medication Sig Dispense Refill  . alendronate (FOSAMAX) 70 MG tablet TK 1 T PO WEEKLY  12  . ALPRAZolam (XANAX) 0.25 MG tablet Take 0.25 mg by mouth at bedtime.     [name] apixaban (ELIQUIS) 2.5 MG TABS tablet Take 1 tablet (2.5 mg total) by mouth 2 (two) times daily. 60 tablet 1  . escitalopram (LEXAPRO) 10 MG tablet TK 1 T PO QD  6  . guaiFENesin (MUCINEX) 600 MG 12 hr tablet Take 1 tablet (600 mg total) by mouth 2 (two) times daily. 40 tablet 0  . Ipratropium-Albuterol (COMBIVENT RESPIMAT) 20-100 MCG/ACT AERS respimat Inhale 1 puff into the lungs every 6 (six) hours as needed for wheezing or shortness of breath. 1 Inhaler 2  . metoprolol succinate (TOPROL-XL) 25 MG 24 hr tablet Take 12.5-25 tablets by mouth 2 (two) times daily. Take 1 tablet in the morning and 1/2 tablet in the evening  3  . potassium chloride (K-DUR,KLOR-CON) 20 MEQ tablet Take 2 tablets (40 mEq total) by mouth daily. 60 tablet 2  . fludrocortisone (FLORINEF) 0.1 MG tablet 0.2 mg in am and 0.1 mg at bedtime    . furosemide (LASIX) 20 MG tablet Take 1 tablet (20 mg total) by mouth daily. 30 tablet 1  . doxycycline (VIBRA-TABS) 100 MG tablet Take 1 tablet (100 mg total) by mouth every 12 (twelve) hours. (Patient not taking: Reported on 09/09/2015) 14 tablet 0  . predniSONE (DELTASONE) 20 MG tablet Take 1 tablet (20 mg total) by mouth daily. Resume when steroids tapering is completed (Patient not taking: Reported on 09/22/2015)    . predniSONE (DELTASONE) 20 MG tablet Take 3 tablets by mouth daily X 1 days; then 2 tablets by mouth daily X 3 days; then resume 20 mg tablet daily (Patient not taking: Reported on 09/17/2015) 10 tablet 0   No facility-administered medications prior to visit.     Allergies:   Review of  patient's allergies indicates no known allergies.   Social History   Social History  . Marital Status: Married    Spouse Name: N/A  . Number of Children: N/A  . Years of Education: N/A   Social History Main Topics  . Smoking status: Never Smoker   . Smokeless tobacco: None  . Alcohol Use: No  . Drug Use: No  . Sexual Activity: Not Asked   Other Topics Concern  . None   Social History Narrative       ROS:   Please see the history of present illness.    ROS All other systems reviewed and are negative.   PHYSICAL EXAM:   VS:  BP 99/60 mmHg  Pulse 74  Ht 5\' 5"  (1.651 m)  Wt 52.617 kg (116 lb)  BMI 19.30 kg/m2   GEN: Well nourished, well developed, in no acute distress.  He is very slender HEENT: normal Neck: no JVD, carotid bruits, or masses Cardiac: Paradoxically split second heart sound, RRR; no murmurs, rubs,  or gallops,no edema; healthy subclavian pacemaker site  Respiratory:  Possibly a faint pleural rub in the left posterior base, otherwise clear to auscultation bilaterally, normal work of breathing; no wheezes or rhonchi heard GI: soft, nontender, nondistended, + BS MS: no deformity or atrophy Skin: warm and dry, no rash Neuro:  Alert and Oriented x 3, Strength and sensation are intact Psych: euthymic mood, full affect  Wt Readings from Last 3 Encounters:  09/22/15 52.617 kg (116 lb)  09/17/15 52.164 kg (115 lb)  09/07/15 48.535 kg (107 lb)      Studies/Labs Reviewed:   EKG:  EKG is not ordered today.  The intracardiac electrogram ordered today demonstrates 100% AV sequential pacing  Recent Labs: 06/07/2015: ALT 29 08/26/2015: Hemoglobin 11.9*; Platelets 254 08/29/2015: B Natriuretic Peptide 288.6*; BUN 28*; Creatinine, Ser 0.64; Potassium 4.3; Sodium 139       ASSESSMENT:    1. Chronic diastolic CHF (congestive heart failure) ([place])   2. Orthostatic hypotension   3. SSS (sick sinus syndrome) ([place])   4. CHB (complete heart block) ([facility])   5.  Paroxysmal atrial fibrillation ([place])   6. Pacemaker   7. Anticoagulant long-term use   8. Essential hypertension      PLAN:  In order of problems listed above:  1. CHF: At least on today's exam she appears to be clinically euvolemic, NYHA functional class I-II. I guess she is at her "dry weight", but she has lost a lot of true weight over the last several months. It seems to be counterproductive to treat her simultaneously with a loop diuretic and a sodium retention agent like fludrocortisone. We'll try to simultaneously discontinue the diuretic and lower the dose of fludrocortisone. I am not entirely convinced that she had heart failure exacerbation during her last hospitalization. Her BNP was indeed elevated (but higher levels are not unexpected in an octogenarian female). Her echocardiogram Doppler findings may support elevated left atrial filling pressure, but the E/e' ratio is actually in the ""gray zone. Her pulmonary hypertension and elevated right atrial filling pressures could be explained by her respiratory problems. Her chest x-ray showed COPD without heart failure. In summary, my suspicion is that she does not need diuretics, but would benefit from lower doses of aldosterone agonists. After discontinuing diuretics have asked her to continue weighing herself daily. I would like her to call if her weight exceeds 120 pounds (which would mean she is developing fluid retention possible heart failure) or less in 110 pounds 2. Orthostatic hypotension: Prefer to try more conservative methods such as slow changes in position, compression stockings, adequate hydration. If she continues to have symptomatic hypotension, ProAmatine during daytime only would be a better choice than fludrocortisone, in order to avoid heart failure. Cannot exclude that she may have a component of adrenal insufficiency, but her treatment with prednisone has been relatively recent and in relatively low doses. 3. SSS: Normal  pacemaker function, appropriate heart rate histogram distribution,  4. CHB: She is pacemaker dependent 5. PAF: Her episodes of exacerbation were clearly related to her pulmonary infection and the arrhythmia seems to have settled down again. Rate control remained adequate even when she was ill on current low dose of beta blocker. 6. [id number] remote downloads every 3 months and office visit yearly 7. Anticoagulation: She should continue anticoagulation since her embolic risk is high. CHADSVasc 5 (age 6, gender, heart failure, hypertension). No serious bleeding complications to date. 8. HTN: Borderline low blood pressure today. Reevaluate after changes in diuretic  and atrial cortisone doses  She is planning a trip to [facility] and will only change the prescriptions described below once she returns home in about 5 days.  Medication Adjustments/Labs and Tests Ordered: Current medicines are reviewed at length with the patient today.  Concerns regarding medicines are outlined above.  Medication changes, Labs and Tests ordered today are listed in the Patient Instructions below. Patient Instructions  Dr Sallyanne Kuster has recommended making the following medication changes: 1. STOP Furosemide 2. TAKE Florinef 0.1 mg - take 1 tablet by mouth once daily  Your physician recommends that you weigh, daily, at the same time every day, and in the same amount of clothing. Please record your daily weights on the handout provided and bring it to your next appointment. Please call the office to speak with a nurse if your weight increases above 120 pounds or decreases below 110 pounds.  Dr Sallyanne Kuster recommends that you schedule a follow-up appointment in 3 months.  If you need a refill on your cardiac medications before your next appointment, please call your pharmacy.     Mikael Spray, MD  09/22/2015 7:22 PM    [facility] [place], [place], [place]  [address] Phone: [phone]; Fax: [phone]

## 2015-09-22 ENCOUNTER — Encounter: Payer: Self-pay | Admitting: Cardiovascular Disease

## 2015-09-22 ENCOUNTER — Ambulatory Visit (INDEPENDENT_AMBULATORY_CARE_PROVIDER_SITE_OTHER): Payer: Medicare Other | Admitting: Cardiovascular Disease

## 2015-09-22 VITALS — BP 99/60 | HR 74 | Ht 65.0 in | Wt 116.0 lb

## 2015-09-22 DIAGNOSIS — Z7901 Long term (current) use of anticoagulants: Secondary | ICD-10-CM

## 2015-09-22 DIAGNOSIS — I495 Sick sinus syndrome: Secondary | ICD-10-CM | POA: Diagnosis not present

## 2015-09-22 DIAGNOSIS — I480 Paroxysmal atrial fibrillation: Secondary | ICD-10-CM | POA: Diagnosis not present

## 2015-09-22 DIAGNOSIS — I442 Atrioventricular block, complete: Secondary | ICD-10-CM | POA: Diagnosis not present

## 2015-09-22 DIAGNOSIS — I5032 Chronic diastolic (congestive) heart failure: Secondary | ICD-10-CM | POA: Diagnosis not present

## 2015-09-22 DIAGNOSIS — I951 Orthostatic hypotension: Secondary | ICD-10-CM

## 2015-09-22 DIAGNOSIS — Z950 Presence of cardiac pacemaker: Secondary | ICD-10-CM

## 2015-09-22 DIAGNOSIS — I10 Essential (primary) hypertension: Secondary | ICD-10-CM

## 2015-09-22 LAB — CUP PACEART INCLINIC DEVICE CHECK
Battery Voltage: 2.84 V
Implantable Lead Serial Number: 23000302
Lead Channel Setting Pacing Amplitude: 0.875
Lead Channel Setting Pacing Amplitude: 2.25 V
Lead Channel Setting Pacing Pulse Width: 0.4 ms
MDC IDC LEAD IMPLANT DT: 19920213
MDC IDC LEAD IMPLANT DT: 19920213
MDC IDC LEAD LOCATION: 753859
MDC IDC LEAD LOCATION: 753860
MDC IDC MSMT BATTERY REMAINING LONGEVITY: 40 mo
MDC IDC MSMT BATTERY REMAINING PERCENTAGE: 40 %
MDC IDC PG SERIAL: 2314326
MDC IDC SESS DTM: 20170404104912
MDC IDC SET LEADCHNL RV SENSING SENSITIVITY: 4 mV

## 2015-09-22 MED ORDER — FLUDROCORTISONE ACETATE 0.1 MG PO TABS: 0.2000 mg | ORAL_TABLET | Freq: Every morning | ORAL | Status: DC

## 2015-09-22 MED ORDER — FLUDROCORTISONE ACETATE 0.1 MG PO TABS: 0.1000 mg | ORAL_TABLET | Freq: Every morning | ORAL | Status: DC

## 2015-09-22 NOTE — Patient Instructions (Addendum)
Dr Sallyanne Kuster has recommended making the following medication changes: 1. STOP Furosemide 2. TAKE Florinef 0.1 mg - take 1 tablet by mouth once daily  Your physician recommends that you weigh, daily, at the same time every day, and in the same amount of clothing. Please record your daily weights on the handout provided and bring it to your next appointment. Please call the office to speak with a nurse if your weight increases above 120 pounds or decreases below 110 pounds.  Dr Sallyanne Kuster recommends that you schedule a follow-up appointment in 3 months.  If you need a refill on your cardiac medications before your next appointment, please call your pharmacy.

## 2015-09-25 ENCOUNTER — Other Ambulatory Visit: Payer: Self-pay

## 2015-09-25 NOTE — Patient Outreach (Signed)
[place] [facility]) Care Management  09/25/2015  KELCEY FREIBERG 02/14/1931 [id number]  Subjective: "Everything is fine".  Assessment: RNCM called for transition of care. Member reports she saw cardiologist and had medication changes. Furosemide discontinued and dosing of florinef change. Member continues to weigh with telemonitoring system. Denies any issues or problems.  Plan: Follow up telephonically next week.  [name], RN, MSN, [place] Coordinator Cell: [phone]

## 2015-09-28 ENCOUNTER — Encounter: Payer: Self-pay | Admitting: Cardiovascular Disease

## 2015-09-30 ENCOUNTER — Telehealth: Payer: Self-pay | Admitting: Cardiovascular Disease

## 2015-09-30 ENCOUNTER — Other Ambulatory Visit: Payer: Self-pay

## 2015-09-30 NOTE — Telephone Encounter (Signed)
Left message to call back  

## 2015-09-30 NOTE — Patient Outreach (Signed)
[name]) Care Management  09/30/2015  [name] 12/12/1930 [id number]   Assessment: Transition of care call. RNCM called member denies any health issues at this time. Reports she has been out of town. Her weight today is 114. RNCM reinforced to member that she is to call cardiologist for weight increase above 120 or below 110.   RNCM received a notification of flagging red on EMMI call regarding medications. Member is forgetful and does not remember response to EMMI call. Reports she is taking medications as directed. RNCM reviewed medications. Member with questions regarding potassium and wants to know if she still needs to be taking potassium.    RNCM called cardiology office to request follow up call with member regarding potassium question. Message left.  Plan: continue to follow, continue transition of care calls.  Debbie Silversmith, RN, MSN, [name] Cell: [phone]

## 2015-09-30 NOTE — Patient Outreach (Signed)
[place] [facility]) Care Management  09/30/2015  [name] 1931/04/17 [id number]   Patient triggered RED on EMMI Heart Failure dashboard, notification sent to [name], RN.  Thanks, [name]. [place], [place] Assistant Phone: [phone] Fax: [phone]

## 2015-09-30 NOTE — Telephone Encounter (Signed)
New Message  Pt c/o medication issue: 1. Name of Medication: potassium chloride (K-DUR,KLOR-CON) 20 MEQ tablet  4. What is your medication issue? Inetta Fermo with [place] [facility] called request a call back to discuss if the pt should continue to take this medication.   [place] with [place] Management asks that you call back and let her know how to proceed as well

## 2015-10-02 ENCOUNTER — Other Ambulatory Visit: Payer: Self-pay

## 2015-10-02 NOTE — Patient Outreach (Signed)
[place] [facility]) Care Management  10/02/2015  SCHERRY BROUSSARD 1931-01-16 [id number]  RNCM received call from cardiology nurse Anderson Malta. Discussed member's question regarding if potassium supplement is still needed. Anderson Malta to follow up with Dr. Orene Desanctis and notify member.  Plan RNCM will continue to follow.  [name], RN, MSN, [place] Coordinator Cell: [phone]

## 2015-10-02 NOTE — Telephone Encounter (Signed)
Potassium can be stopped when not taking furosemide

## 2015-10-02 NOTE — Telephone Encounter (Signed)
Inetta Fermo from [place] [facility] returned message. Pt is asking if she still needs to be taking her potassium since her furosemide was discontinued.  Juanna asked to call the pt back with recommendation as well.   Please advise.  Will route to Dr Sallyanne Kuster.

## 2015-10-02 NOTE — Telephone Encounter (Signed)
Left message with Inetta Fermo at [facility] to clarify.

## 2015-10-02 NOTE — Telephone Encounter (Signed)
Returned call to pt and told her she could stop taking her potassium since she is not longer taking furosemide per Dr Sallyanne Kuster.  Also, called and left message with Inetta Fermo at [place] [facility].

## 2015-10-05 ENCOUNTER — Other Ambulatory Visit: Payer: Self-pay

## 2015-10-05 NOTE — Patient Outreach (Signed)
[place] [facility]) Care Management  10/05/2015  [name] Jul 10, 1930 [id number]  Assessment: Transition of care call.Membe with no complaints. Denies any questions or concerns.  Continues to be monitored with tele-health equipment.  RNCM reinforced cardiology parameters for when to call.  Plan: transtion of care call next week.  [name], RN, MSN, [place] Coordinator Cell: [phone]

## 2015-10-13 DIAGNOSIS — I700 Atherosclerosis of aorta: Secondary | ICD-10-CM | POA: Diagnosis not present

## 2015-10-13 DIAGNOSIS — J449 Chronic obstructive pulmonary disease, unspecified: Secondary | ICD-10-CM | POA: Diagnosis not present

## 2015-10-13 DIAGNOSIS — I480 Paroxysmal atrial fibrillation: Secondary | ICD-10-CM | POA: Diagnosis not present

## 2015-10-13 DIAGNOSIS — M353 Polymyalgia rheumatica: Secondary | ICD-10-CM | POA: Diagnosis not present

## 2015-10-13 DIAGNOSIS — I10 Essential (primary) hypertension: Secondary | ICD-10-CM | POA: Diagnosis not present

## 2015-10-13 DIAGNOSIS — M810 Age-related osteoporosis without current pathological fracture: Secondary | ICD-10-CM | POA: Diagnosis not present

## 2015-10-13 DIAGNOSIS — I5032 Chronic diastolic (congestive) heart failure: Secondary | ICD-10-CM | POA: Diagnosis not present

## 2015-10-13 DIAGNOSIS — E441 Mild protein-calorie malnutrition: Secondary | ICD-10-CM | POA: Diagnosis not present

## 2015-10-14 ENCOUNTER — Other Ambulatory Visit: Payer: Self-pay

## 2015-10-14 NOTE — Patient Outreach (Signed)
[place] [facility]) Care Management  10/14/2015  KATORI DALPIAZ Aug 24, 1930 [id number]  Care coordination-call to arrange home visit.  Plan: home visit arranged for this week.  [name], RN, MSN, [place] Coordinator Cell: [phone]

## 2015-10-15 ENCOUNTER — Other Ambulatory Visit: Payer: Self-pay

## 2015-10-15 NOTE — Patient Outreach (Signed)
[place] [facility]) Care Management  [place]  10/15/2015   [name] Apr 06, 1931 [id number]  Subjective: member reports feeling well, she reports she meets her friends at [employer] every morning for coffee. She states, "I don't eat, I just drink coffee".  Objective: BP 154/82 mmHg  Pulse 73  Resp 20  Ht 1.651 m (5\' 5" )  Wt 114 lb 9.6 oz (51.982 kg)  BMI 19.07 kg/m2  SpO2 97%   Encounter Medications:  Outpatient Encounter Prescriptions as of 10/15/2015  Medication Sig Note  . alendronate (FOSAMAX) 70 MG tablet TK 1 T PO WEEKLY 08/25/2015: Received from: External Pharmacy  . ALPRAZolam (XANAX) 0.25 MG tablet Take 0.25 mg by mouth at bedtime.    [name] apixaban (ELIQUIS) 2.5 MG TABS tablet Take 1 tablet (2.5 mg total) by mouth 2 (two) times daily.   [name] escitalopram (LEXAPRO) 10 MG tablet TK 1 T PO QD 08/25/2015: Received from: External Pharmacy  . fludrocortisone (FLORINEF) 0.1 MG tablet Take 1 tablet (0.1 mg total) by mouth every morning.   [name] guaiFENesin (MUCINEX) 600 MG 12 hr tablet Take 1 tablet (600 mg total) by mouth 2 (two) times daily.   . Ipratropium-Albuterol (COMBIVENT RESPIMAT) 20-100 MCG/ACT AERS respimat Inhale 1 puff into the lungs every 6 (six) hours as needed for wheezing or shortness of breath.   . metoprolol succinate (TOPROL-XL) 25 MG 24 hr tablet Take 12.5-25 tablets by mouth 2 (two) times daily. Take 1 tablet in the morning and 1/2 tablet in the evening   . mirtazapine (REMERON) 15 MG tablet Take 15 mg by mouth at bedtime. 10/15/2015: Taking one half tablet at night.  . predniSONE (DELTASONE) 20 MG tablet Take 1 tablet by mouth daily. 10/15/2015: Prednisone 10 mg tablets alternating 1.5 tablets(15mg ) and 2 tablets (20 mg).  . fluticasone (FLONASE) 50 MCG/ACT nasal spray Place 1 spray into both nostrils daily. Reported on 10/15/2015   . potassium chloride (K-DUR,KLOR-CON) 20 MEQ tablet Take 2 tablets (40 mEq total) by mouth daily. (Patient not taking:  Reported on 10/15/2015)    No facility-administered encounter medications on file as of 10/15/2015.    Assessment: 82 year old with history of heart failure. Member reports she is doing well. Continues to participate in tele- health monitoring through home health.   Per member's blood pressure readings elevated in the mornings. 4/27 reading 182/94 HR 79 weight 114.6 pounds; 4/26 Blood Pressure 172/94 HR 83 weight 115.2 pounds; 4/25 190/93 HR 73 weight 114.8 pounds. Member states these readings are before taking medications. Manual reading today 154/82 HR 73. RNCM took member blood pressure with her automatic cuff-154/80 HR 70. Member's return demonstration of using blood pressure cuff revealed member was having some difficulty with placement of cuff and she was placing the cuff over her elbow. RNCM demonstrated location of where to place the cuff and reinforced how to obtain the most accurate reading. Member to continue to check/record blood pressure readings and check weight. Continues with participation with [facility] home health.  Heart failure zone tool reinforced.  Plan: home visit next month for follow up.  [name], RN, MSN, [place] Coordinator Cell: [phone]

## 2015-10-29 DIAGNOSIS — I5032 Chronic diastolic (congestive) heart failure: Secondary | ICD-10-CM | POA: Diagnosis not present

## 2015-10-29 DIAGNOSIS — I110 Hypertensive heart disease with heart failure: Secondary | ICD-10-CM | POA: Diagnosis not present

## 2015-10-29 DIAGNOSIS — J441 Chronic obstructive pulmonary disease with (acute) exacerbation: Secondary | ICD-10-CM | POA: Diagnosis not present

## 2015-10-29 DIAGNOSIS — I480 Paroxysmal atrial fibrillation: Secondary | ICD-10-CM | POA: Diagnosis not present

## 2015-10-29 DIAGNOSIS — F329 Major depressive disorder, single episode, unspecified: Secondary | ICD-10-CM | POA: Diagnosis not present

## 2015-10-29 DIAGNOSIS — I951 Orthostatic hypotension: Secondary | ICD-10-CM | POA: Diagnosis not present

## 2015-10-30 ENCOUNTER — Telehealth: Payer: Self-pay | Admitting: Cardiovascular Disease

## 2015-10-30 NOTE — Telephone Encounter (Signed)
New Message   Pt was told to notify office when her weight exceeded 120lbs- pt stated today she weighed- 121 lbs. Please call back and discuss.

## 2015-10-30 NOTE — Telephone Encounter (Signed)
Returned call.  Pt seen 1 month ago and encouraged to call if wt above 120 or below 110. Scale wt at appt was 116 lbs.  Pt notes wt today 121 lbs. She has been between 117-120 x 1 month. Notes readings consistent between home scale and HF management scale. She does not note any symptoms with exception of "feeling a little tired". She notes congestion but associates this w/ cough/allergy symptoms she's had. She denies SOB. She denies swelling lower extremities.  Pt aware I will seek Dr. Victorino December recommendations - advice on reassessment of BNP, restarting lasix PRN.

## 2015-10-30 NOTE — Telephone Encounter (Signed)
Let's follow over the weekend before we decide to increase diuretics again. Touch base on Monday

## 2015-10-31 ENCOUNTER — Encounter (HOSPITAL_COMMUNITY): Payer: Self-pay

## 2015-10-31 ENCOUNTER — Inpatient Hospital Stay (HOSPITAL_COMMUNITY)
Admission: EM | Admit: 2015-10-31 | Discharge: 2015-11-02 | DRG: 292 | Disposition: A | Discharge status: Home / Self Care | Payer: Medicare Other | Attending: Internal Medicine | Admitting: Internal Medicine

## 2015-10-31 ENCOUNTER — Emergency Department (HOSPITAL_COMMUNITY): Payer: Medicare Other

## 2015-10-31 DIAGNOSIS — Z6820 Body mass index (BMI) 20.0-20.9, adult: Secondary | ICD-10-CM

## 2015-10-31 DIAGNOSIS — I110 Hypertensive heart disease with heart failure: Principal | ICD-10-CM | POA: Diagnosis present

## 2015-10-31 DIAGNOSIS — I951 Orthostatic hypotension: Secondary | ICD-10-CM | POA: Diagnosis present

## 2015-10-31 DIAGNOSIS — I480 Paroxysmal atrial fibrillation: Secondary | ICD-10-CM | POA: Diagnosis not present

## 2015-10-31 DIAGNOSIS — M3390 Dermatopolymyositis, unspecified, organ involvement unspecified: Secondary | ICD-10-CM | POA: Diagnosis present

## 2015-10-31 DIAGNOSIS — I5030 Unspecified diastolic (congestive) heart failure: Secondary | ICD-10-CM | POA: Diagnosis present

## 2015-10-31 DIAGNOSIS — J449 Chronic obstructive pulmonary disease, unspecified: Secondary | ICD-10-CM | POA: Diagnosis present

## 2015-10-31 DIAGNOSIS — Z7901 Long term (current) use of anticoagulants: Secondary | ICD-10-CM

## 2015-10-31 DIAGNOSIS — J189 Pneumonia, unspecified organism: Secondary | ICD-10-CM | POA: Diagnosis not present

## 2015-10-31 DIAGNOSIS — Z950 Presence of cardiac pacemaker: Secondary | ICD-10-CM

## 2015-10-31 DIAGNOSIS — M3313 Other dermatomyositis without myopathy: Secondary | ICD-10-CM | POA: Diagnosis present

## 2015-10-31 DIAGNOSIS — R0602 Shortness of breath: Secondary | ICD-10-CM | POA: Diagnosis not present

## 2015-10-31 DIAGNOSIS — I10 Essential (primary) hypertension: Secondary | ICD-10-CM | POA: Diagnosis present

## 2015-10-31 DIAGNOSIS — I5033 Acute on chronic diastolic (congestive) heart failure: Secondary | ICD-10-CM | POA: Diagnosis present

## 2015-10-31 DIAGNOSIS — I5032 Chronic diastolic (congestive) heart failure: Secondary | ICD-10-CM

## 2015-10-31 DIAGNOSIS — R0902 Hypoxemia: Secondary | ICD-10-CM | POA: Diagnosis present

## 2015-10-31 DIAGNOSIS — J90 Pleural effusion, not elsewhere classified: Secondary | ICD-10-CM | POA: Diagnosis not present

## 2015-10-31 DIAGNOSIS — D72829 Elevated white blood cell count, unspecified: Secondary | ICD-10-CM | POA: Diagnosis not present

## 2015-10-31 DIAGNOSIS — R05 Cough: Secondary | ICD-10-CM | POA: Diagnosis not present

## 2015-10-31 DIAGNOSIS — R069 Unspecified abnormalities of breathing: Secondary | ICD-10-CM | POA: Diagnosis not present

## 2015-10-31 LAB — CBC WITH DIFFERENTIAL/PLATELET
BASOS PCT: 0 %
Basophils Absolute: 0 10*3/uL (ref 0.0–0.1)
EOS ABS: 0 10*3/uL (ref 0.0–0.7)
Eosinophils Relative: 0 %
HCT: 36.3 % (ref 36.0–46.0)
HEMOGLOBIN: 11.2 g/dL — AB (ref 12.0–15.0)
LYMPHS ABS: 0.3 10*3/uL — AB (ref 0.7–4.0)
Lymphocytes Relative: 2 %
MCH: 28.1 pg (ref 26.0–34.0)
MCHC: 30.9 g/dL (ref 30.0–36.0)
MCV: 91.2 fL (ref 78.0–100.0)
MONO ABS: 0.3 10*3/uL (ref 0.1–1.0)
MONOS PCT: 2 %
Neutro Abs: 14.2 10*3/uL — ABNORMAL HIGH (ref 1.7–7.7)
Neutrophils Relative %: 96 %
Platelets: 315 10*3/uL (ref 150–400)
RBC: 3.98 MIL/uL (ref 3.87–5.11)
RDW: 15.6 % — AB (ref 11.5–15.5)
WBC: 14.8 10*3/uL — ABNORMAL HIGH (ref 4.0–10.5)

## 2015-10-31 LAB — COMPREHENSIVE METABOLIC PANEL
ALK PHOS: 59 U/L (ref 38–126)
ALT: 19 U/L (ref 14–54)
AST: 22 U/L (ref 15–41)
Albumin: 3.4 g/dL — ABNORMAL LOW (ref 3.5–5.0)
Anion gap: 12 (ref 5–15)
BILIRUBIN TOTAL: 0.9 mg/dL (ref 0.3–1.2)
BUN: 14 mg/dL (ref 6–20)
CALCIUM: 8.7 mg/dL — AB (ref 8.9–10.3)
CO2: 28 mmol/L (ref 22–32)
CREATININE: 0.67 mg/dL (ref 0.44–1.00)
Chloride: 100 mmol/L — ABNORMAL LOW (ref 101–111)
Glucose, Bld: 218 mg/dL — ABNORMAL HIGH (ref 65–99)
Potassium: 3.4 mmol/L — ABNORMAL LOW (ref 3.5–5.1)
SODIUM: 140 mmol/L (ref 135–145)
Total Protein: 6.4 g/dL — ABNORMAL LOW (ref 6.5–8.1)

## 2015-10-31 LAB — I-STAT CG4 LACTIC ACID, ED
LACTIC ACID, VENOUS: 2.36 mmol/L — AB (ref 0.5–2.0)
LACTIC ACID, VENOUS: 1.09 mmol/L (ref 0.5–2.0)

## 2015-10-31 LAB — I-STAT TROPONIN, ED
Troponin i, poc: 0.03 ng/mL (ref 0.00–0.08)
Troponin i, poc: 0.02 ng/mL (ref 0.00–0.08)

## 2015-10-31 LAB — BRAIN NATRIURETIC PEPTIDE: B Natriuretic Peptide: 907.1 pg/mL — ABNORMAL HIGH (ref 0.0–100.0)

## 2015-10-31 MED ORDER — MIRTAZAPINE 15 MG PO TABS
7.5000 mg | ORAL_TABLET | Freq: Every day | ORAL | Status: DC
  Administered 2015-11-01: 7.5 mg via ORAL
  Filled 2015-10-31 (×2): qty 1

## 2015-10-31 MED ORDER — PREDNISONE 20 MG PO TABS
20.0000 mg | ORAL_TABLET | ORAL | Status: DC
  Administered 2015-11-01: 20 mg via ORAL
  Filled 2015-10-31 (×2): qty 1

## 2015-10-31 MED ORDER — HYDROMORPHONE HCL 1 MG/ML IJ SOLN
0.5000 mg | INTRAMUSCULAR | Status: DC | PRN
  Filled 2015-10-31: qty 1

## 2015-10-31 MED ORDER — PREDNISONE 5 MG PO TABS
15.0000 mg | ORAL_TABLET | ORAL | Status: DC
  Administered 2015-11-02: 15 mg via ORAL
  Filled 2015-10-31 (×2): qty 3

## 2015-10-31 MED ORDER — METOPROLOL SUCCINATE ER 25 MG PO TB24
25.0000 mg | ORAL_TABLET | Freq: Every day | ORAL | Status: DC
  Administered 2015-11-01 – 2015-11-02 (×2): 25 mg via ORAL
  Filled 2015-10-31 (×2): qty 1

## 2015-10-31 MED ORDER — ONDANSETRON HCL 4 MG/2ML IJ SOLN: 4.0000 mg | Freq: Four times a day (QID) | INTRAMUSCULAR | Status: DC | PRN

## 2015-10-31 MED ORDER — SODIUM CHLORIDE 0.9 % IV BOLUS (SEPSIS)
1000.0000 mL | Freq: Once | INTRAVENOUS | Status: AC
  Administered 2015-10-31: 1000 mL via INTRAVENOUS

## 2015-10-31 MED ORDER — ESCITALOPRAM OXALATE 10 MG PO TABS
10.0000 mg | ORAL_TABLET | Freq: Every day | ORAL | Status: DC
  Administered 2015-11-01 – 2015-11-02 (×2): 10 mg via ORAL
  Filled 2015-10-31 (×2): qty 1

## 2015-10-31 MED ORDER — VANCOMYCIN HCL 500 MG IV SOLR
500.0000 mg | Freq: Two times a day (BID) | INTRAVENOUS | Status: DC
  Filled 2015-10-31: qty 500

## 2015-10-31 MED ORDER — SODIUM CHLORIDE 0.9% FLUSH
3.0000 mL | Freq: Two times a day (BID) | INTRAVENOUS | Status: DC
  Administered 2015-10-31 – 2015-11-01 (×2): 3 mL via INTRAVENOUS

## 2015-10-31 MED ORDER — IPRATROPIUM-ALBUTEROL 0.5-2.5 (3) MG/3ML IN SOLN
3.0000 mL | Freq: Three times a day (TID) | RESPIRATORY_TRACT | Status: DC
  Administered 2015-11-01 (×3): 3 mL via RESPIRATORY_TRACT
  Filled 2015-10-31 (×2): qty 3

## 2015-10-31 MED ORDER — IPRATROPIUM-ALBUTEROL 0.5-2.5 (3) MG/3ML IN SOLN
3.0000 mL | RESPIRATORY_TRACT | Status: DC
  Administered 2015-10-31: 3 mL via RESPIRATORY_TRACT
  Filled 2015-10-31: qty 3

## 2015-10-31 MED ORDER — METOPROLOL SUCCINATE ER 25 MG PO TB24
12.5000 mg | ORAL_TABLET | Freq: Every day | ORAL | Status: DC
  Administered 2015-10-31 – 2015-11-01 (×2): 12.5 mg via ORAL
  Filled 2015-10-31 (×2): qty 1

## 2015-10-31 MED ORDER — ACETAMINOPHEN 650 MG RE SUPP: 650.0000 mg | Freq: Four times a day (QID) | RECTAL | Status: DC | PRN

## 2015-10-31 MED ORDER — SODIUM CHLORIDE 0.9 % IV SOLN
INTRAVENOUS | Status: DC
  Administered 2015-10-31: 23:00:00 via INTRAVENOUS

## 2015-10-31 MED ORDER — VANCOMYCIN HCL IN DEXTROSE 1-5 GM/200ML-% IV SOLN
1000.0000 mg | INTRAVENOUS | Status: AC
  Administered 2015-10-31: 1000 mg via INTRAVENOUS
  Filled 2015-10-31: qty 200

## 2015-10-31 MED ORDER — OXYCODONE HCL 5 MG PO TABS: 5.0000 mg | ORAL_TABLET | ORAL | Status: DC | PRN

## 2015-10-31 MED ORDER — ONDANSETRON HCL 4 MG PO TABS
4.0000 mg | ORAL_TABLET | Freq: Four times a day (QID) | ORAL | Status: DC | PRN
Start: 2015-10-31 — End: 2015-11-02

## 2015-10-31 MED ORDER — ACETAMINOPHEN 325 MG PO TABS: 650.0000 mg | ORAL_TABLET | Freq: Four times a day (QID) | ORAL | Status: DC | PRN

## 2015-10-31 MED ORDER — DEXTROSE 5 % IV SOLN
2.0000 g | INTRAVENOUS | Status: AC
  Administered 2015-10-31: 2 g via INTRAVENOUS
  Filled 2015-10-31: qty 2

## 2015-10-31 MED ORDER — APIXABAN 2.5 MG PO TABS
2.5000 mg | ORAL_TABLET | Freq: Two times a day (BID) | ORAL | Status: DC
  Administered 2015-11-01 – 2015-11-02 (×3): 2.5 mg via ORAL
  Filled 2015-10-31 (×4): qty 1

## 2015-10-31 MED ORDER — DEXTROSE 5 % IV SOLN: 1.0000 g | Freq: Three times a day (TID) | INTRAVENOUS | Status: DC

## 2015-10-31 MED ORDER — ALPRAZOLAM 0.25 MG PO TABS
0.2500 mg | ORAL_TABLET | Freq: Every day | ORAL | Status: DC
  Administered 2015-10-31 – 2015-11-01 (×2): 0.25 mg via ORAL
  Filled 2015-10-31 (×2): qty 1

## 2015-10-31 MED ORDER — DEXTROSE 5 % IV SOLN
1.0000 g | Freq: Two times a day (BID) | INTRAVENOUS | Status: DC
  Filled 2015-10-31: qty 1

## 2015-10-31 MED ORDER — FLUDROCORTISONE ACETATE 0.1 MG PO TABS
0.1000 mg | ORAL_TABLET | Freq: Every morning | ORAL | Status: DC
  Administered 2015-11-01 – 2015-11-02 (×2): 0.1 mg via ORAL
  Filled 2015-10-31 (×2): qty 1

## 2015-10-31 NOTE — Progress Notes (Signed)
Pharmacy Antibiotic Note  BRITNY PRIBNOW is a 82 y.o. female admitted on 10/31/2015 from [place] with productive cough and low O2 sats.  Pharmacy has been consulted for Vancomycin and Cefepim dosing for HCAP.  Plan:  Cefepime 2g IV stat, then 1g IV q12h  Vancomycin 1g IV stat, then 500 mg IV q12h  Measure Vanc trough at steady state.  Follow up renal fxn, culture results, and clinical course.   Height: 5\' 5"  (165.1 cm) Weight: 115 lb (52.164 kg) IBW/kg (Calculated) : 57  Temp (24hrs), Avg:98.2 F (36.8 C), Min:98.2 F (36.8 C), Max:98.2 F (36.8 C)   Recent Labs Lab 10/31/15 1818 10/31/15 1824  WBC 14.8*  --   CREATININE 0.67  --   LATICACIDVEN  --  2.36*    Estimated Creatinine Clearance: 44.7 mL/min (by C-G formula based on Cr of 0.67).  No Known Allergies  Antimicrobials this admission: 5/13 Cefepime >>  5/13 Vancomycin >>   Dose adjustments this admission:  Microbiology results: 5/13 BCx: ordered  Thank you for allowing pharmacy to be a part of this patient's care.  Gretta Arab PharmD, BCPS Pager [phone] 10/31/2015 6:30 PM

## 2015-10-31 NOTE — ED Notes (Signed)
Patient comfortable.  Watching TV and talking on phone.  NAD at this time.

## 2015-10-31 NOTE — H&P (Signed)
[facility] Admission History and Physical       [name] [id number] DOB: 09-27-1930 DOA: 10/31/2015  Referring physician: PCP: Mathews Argyle, MD  Specialists:   Chief Complaint: Cough and SOB  HPI: [name] is a 82 y.o. female with a history of COPD, CHF, Paroxsymal Atrial Fibrillation on Eliquis Rx, HTN and Dermatomyositis who presents to the ED after being seen at the [facility] for productive Cough and SOB worsening over the past 3 days.   He was found to have mild hypoxia at 92%, and a Chest X-ray was performed and revealed Bibasilar infiltrates.   She was sent to the ED and was found to have Leukocytosis (14.8) and an elevated Lactic Acid of 2.36, and Shew as placed on IV antibiotic Rx of Vancomycin and Cefepime for HCAP since she had been hospitalized in 08/2015.       Review of Systems:  Constitutional: No Weight Loss, No Weight Gain, Night Sweats, Fevers, Chills, Dizziness, Light Headedness, Fatigue, or Generalized Weakness HEENT: No Headaches, Difficulty Swallowing,Tooth/Dental Problems,Sore Throat,  No Sneezing, Rhinitis, Ear Ache, Nasal Congestion, or Post Nasal Drip,  Cardio-vascular:  No Chest pain, Orthopnea, PND, Edema in Lower Extremities, Anasarca, Dizziness, Palpitations  Resp: +Dyspnea, No DOE, +Productive Cough, No Non-Productive Cough, No Hemoptysis, No Wheezing.    GI: No Heartburn, Indigestion, Abdominal Pain, Nausea, Vomiting, Diarrhea, Constipation, Hematemesis, Hematochezia, Melena, Change in Bowel Habits,  Loss of Appetite  GU: No Dysuria, No Change in Color of Urine, No Urgency or Urinary Frequency, No Flank pain.  Musculoskeletal: No Joint Pain or Swelling, No Decreased Range of Motion, No Back Pain.  Neurologic: No Syncope, No Seizures, Muscle Weakness, Paresthesia, Vision Disturbance or Loss, No Diplopia, No Vertigo, No Difficulty Walking,  Skin: No Rash or Lesions. Psych: No Change in Mood or Affect, No Depression or Anxiety,  No Memory loss, No Confusion, or Hallucinations   Past Medical History  Diagnosis Date  . Syncope   . COPD (chronic obstructive pulmonary disease) ([place])   . DM (dermatomyositis)   . Orthostatic hypotension   . Systemic hypertension   . Thoracic kyphosis   . Scoliosis   . Pacemaker 07/13/2013    Her original pacemaker and the current leads were implanted in 1992. She has had 2 generator change out, most recently in 2010. Her device is a Buyer, retail 2110 non RF dual-chamber pacemaker with a battery longevity estimated at about 7 years. The atrial lead is a St. Jude [id number] and the ventricular lead was a Biotronik [id number].   [name] CHB (complete heart block) ([place]) 07/13/2013    Pacemaker dependent  . SSS (sick sinus syndrome) ([place]) 07/13/2013  . Paroxysmal atrial fibrillation ([place]) 09/04/2014  . CHF (congestive heart failure) [facility])      Past Surgical History  Procedure Laterality Date  . Permanent pacemaker generator change  03/27/2009    St.Jude  . Nm myocar perf wall motion  09/13/2011    Low risk  . US echocardiography  03/28/2012    Mod LAE,mild MR,aortic sclerosis w/mod AI,mod. TR,mild PI,Stage I diastolic dysfunction      Prior to Admission medications   Medication Sig Start Date End Date Taking? Authorizing Provider  alendronate (FOSAMAX) 70 MG tablet take 70mg s once weekly 08/18/15  Yes Historical Provider, MD  ALPRAZolam (XANAX) 0.25 MG tablet Take 0.25 mg by mouth at bedtime.    Yes Historical Provider, MD  apixaban (ELIQUIS) 2.5 MG TABS tablet Take 1 tablet (2.5 mg total)  by mouth 2 (two) times daily. 08/29/15  Yes Barton Dubois, MD  escitalopram (LEXAPRO) 10 MG tablet take 10 mgs daily 08/10/15  Yes Historical Provider, MD  fludrocortisone (FLORINEF) 0.1 MG tablet Take 1 tablet (0.1 mg total) by mouth every morning. 09/22/15  Yes Mihai Croitoru, MD  guaiFENesin (MUCINEX) 600 MG 12 hr tablet Take 1 tablet (600 mg total) by mouth 2 (two) times daily. 08/29/15  Yes Barton Dubois, MD   Ipratropium-Albuterol (COMBIVENT RESPIMAT) 20-100 MCG/ACT AERS respimat Inhale 1 puff into the lungs every 6 (six) hours as needed for wheezing or shortness of breath. 08/29/15  Yes Barton Dubois, MD  metoprolol succinate (TOPROL-XL) 25 MG 24 hr tablet Take 12.5-25 tablets by mouth 2 (two) times daily. Take 1 tablet in the morning and 1/2 tablet in the evening 05/15/15  Yes Historical Provider, MD  mirtazapine (REMERON) 15 MG tablet Take 7.5 mg by mouth at bedtime.    Yes Historical Provider, MD  predniSONE (DELTASONE) 10 MG tablet Take 15-20 mg by mouth daily. Alternate between 15 and 20 mgs daily 10/13/15  Yes Historical Provider, MD  potassium chloride (K-DUR,KLOR-CON) 20 MEQ tablet Take 2 tablets (40 mEq total) by mouth daily. Patient not taking: Reported on 10/15/2015 08/29/15   Barton Dubois, MD     No Known Allergies  Social History:  reports that she has never smoked. She does not have any smokeless tobacco history on file. She reports that she does not drink alcohol or use illicit drugs.    History reviewed. No pertinent family history.     Physical Exam:  GEN:    Pleasant Thin Elderly  82 y.o. Caucasian female examined and in no acute distress; cooperative with exam Filed Vitals:   10/31/15 1719 10/31/15 1724 10/31/15 1726 10/31/15 1945  BP:   171/72 141/108  Pulse:   72 72  Temp:   98.2 F (36.8 C)   TempSrc:   Oral   Resp:   16 21  Height:  5\' 5"  (1.651 m)    Weight:  52.164 kg (115 lb)    SpO2: 95%  95% 93%   Blood pressure 141/108, pulse 72, temperature 98.2 F (36.8 C), temperature source Oral, resp. rate 21, height 5\' 5"  (1.651 m), weight 52.164 kg (115 lb), SpO2 93 %. PSYCH: She is alert and oriented x4; does not appear anxious does not appear depressed; affect is normal HEENT: Normocephalic and Atraumatic, Mucous membranes pink; PERRLA; EOM intact; Fundi:  Benign;  No scleral icterus, Nares: Patent, Oropharynx: Clear,     Neck:  FROM, No Cervical Lymphadenopathy nor  Thyromegaly or Carotid Bruit; No JVD; Breasts:: Not examined CHEST WALL: No tenderness CHEST: Normal respiration, clear to auscultation bilaterally HEART: Regular rate and rhythm; no murmurs rubs or gallops BACK: No kyphosis or scoliosis; No CVA tenderness ABDOMEN: Positive Bowel Sounds, Scaphoid, Obese, Soft Non-Tender, No Rebound or Guarding; No Masses, No Organomegaly. Rectal Exam: Not done EXTREMITIES: No Cyanosis, Clubbing, or Edema; No Ulcerations. Genitalia: not examined PULSES: 2+ and symmetric SKIN: Normal hydration no rash or ulceration CNS:  Alert and Oriented x 4, No Focal Deficits Vascular: pulses palpable throughout    Labs on Admission:  Basic Metabolic Panel:  Recent Labs Lab 10/31/15 1818  NA 140  K 3.4*  CL 100*  CO2 28  GLUCOSE 218*  BUN 14  CREATININE 0.67  CALCIUM 8.7*   Liver Function Tests:  Recent Labs Lab 10/31/15 1818  AST 22  ALT 19  ALKPHOS 59  BILITOT 0.9  PROT 6.4*  ALBUMIN 3.4*   No results for input(s): LIPASE, AMYLASE in the last 168 hours. No results for input(s): AMMONIA in the last 168 hours. CBC:  Recent Labs Lab 10/31/15 1818  WBC 14.8*  NEUTROABS 14.2*  HGB 11.2*  HCT 36.3  MCV 91.2  PLT 315   Cardiac Enzymes: No results for input(s): CKTOTAL, CKMB, CKMBINDEX, TROPONINI in the last 168 hours.  BNP (last 3 results)  Recent Labs  08/25/15 2217 08/29/15 0538 10/31/15 1818  BNP 774.8* 288.6* 907.1*    ProBNP (last 3 results) No results for input(s): PROBNP in the last 8760 hours.  CBG: No results for input(s): GLUCAP in the last 168 hours.  Radiological Exams on Admission: Dg Chest 2 View  10/31/2015  CLINICAL DATA:  SOB, cough. Low O2. H/o COPD, CHF, a-fib. Surgical h/o pacemaker. Nonsmoker. EXAM: CHEST  2 VIEW COMPARISON:  10/31/2015 FINDINGS: Right-sided transvenous pacemaker leads to the right atrium and right ventricle. Lungs are hyperinflated. Heart is enlarged. There is mild perihilar peribronchial  thickening. There is stable small bilateral pleural effusions. Bilateral lower lobe infiltrates appear stable. No pulmonary edema. IMPRESSION: 1. Cardiomegaly. 2. Stable bilateral lower lobe infiltrates in small effusions. Electronically Signed   By: Nolon Nations M.D.   On: 10/31/2015 18:50     EKG: Independently reviewed.    Assessment/Plan:      82 y.o. female with  Principal Problem:    HCAP (healthcare-associated pneumonia)   IV Vancomycin and Cefepime   Active Problems:  Leukocytosis   Monitor Trend    COPD (chronic obstructive pulmonary disease) (HCC)   Duonebs   O2 PRN      Diastolic CHF (HCC)   Monitor I/Os     Paroxysmal atrial fibrillation (H[facility])   On Metoprolol and Eliquis Rx        Anticoagulant long-term use   On Eliquis Rx      Benign essential HTN   Continue Metoprolol Rx   Monitor BPs    Orthostatic hypotension   On Florinef Rx    DM (dermatomyositis)   On Prednisone Rx    DVT Prophylaxis   On Eliquis Rx    Code Status:     FULL CODE  Family Communication:   No Family Present    Disposition Plan:    Inpatient Status        Time spent: 42 Minutes      Theressa Millard [facility] Pager [phone]   If 7AM -7PM Please Contact the Day Rounding Team MD for [facility]  If 7PM-7AM, Please Contact Night-Floor Coverage  [email or web address] Password [email or web address] 10/31/2015, 9:09 PM     ADDENDUM:   Patient was seen and examined on 10/31/2015

## 2015-10-31 NOTE — ED Notes (Signed)
Bed: RESB Expected date:  Expected time:  Means of arrival:  Comments: SHOB  

## 2015-10-31 NOTE — ED Notes (Signed)
Bed: WA17 Expected date:  Expected time:  Means of arrival:  Comments: Hold for Resc A

## 2015-10-31 NOTE — ED Notes (Addendum)
PT RECEIVED FROM [facility] FOR A PRODUCTIVE COUGH X2 DAYS. PT DENIES SOB, BUT RA O2 SAT WAS 92-93% PER EMS. PT ARRIVED ON O2 AT 2L VIA NASAL CANNULA. PT STATES SHE COUGHED UP WHITE MUCUS. DENIES FEVER.

## 2015-11-01 ENCOUNTER — Encounter (HOSPITAL_COMMUNITY): Payer: Self-pay | Admitting: Certified Registered Nurse Anesthetist

## 2015-11-01 DIAGNOSIS — I10 Essential (primary) hypertension: Secondary | ICD-10-CM

## 2015-11-01 LAB — CBC
HCT: 31.9 % — ABNORMAL LOW (ref 36.0–46.0)
Hemoglobin: 10.1 g/dL — ABNORMAL LOW (ref 12.0–15.0)
MCH: 28.6 pg (ref 26.0–34.0)
MCHC: 31.7 g/dL (ref 30.0–36.0)
MCV: 90.4 fL (ref 78.0–100.0)
PLATELETS: 293 10*3/uL (ref 150–400)
RBC: 3.53 MIL/uL — ABNORMAL LOW (ref 3.87–5.11)
RDW: 15.4 % (ref 11.5–15.5)
WBC: 12.7 10*3/uL — AB (ref 4.0–10.5)

## 2015-11-01 LAB — BASIC METABOLIC PANEL
ANION GAP: 7 (ref 5–15)
BUN: 13 mg/dL (ref 6–20)
CALCIUM: 8.3 mg/dL — AB (ref 8.9–10.3)
CHLORIDE: 107 mmol/L (ref 101–111)
CO2: 30 mmol/L (ref 22–32)
Creatinine, Ser: 0.58 mg/dL (ref 0.44–1.00)
GFR calc Af Amer: >60 mL/min (ref >60)
GLUCOSE: 147 mg/dL — AB (ref 65–99)
POTASSIUM: 3.3 mmol/L — AB (ref 3.5–5.1)
SODIUM: 144 mmol/L (ref 135–145)

## 2015-11-01 LAB — STREP PNEUMONIAE URINARY ANTIGEN: STREP PNEUMO URINARY ANTIGEN: NEGATIVE

## 2015-11-01 MED ORDER — POTASSIUM CHLORIDE CRYS ER 20 MEQ PO TBCR
40.0000 meq | EXTENDED_RELEASE_TABLET | Freq: Four times a day (QID) | ORAL | Status: AC
  Administered 2015-11-01 (×2): 40 meq via ORAL
  Filled 2015-11-01 (×2): qty 2

## 2015-11-01 MED ORDER — FUROSEMIDE 10 MG/ML IJ SOLN: 40.0000 mg | Freq: Once | INTRAMUSCULAR | Status: DC

## 2015-11-01 MED ORDER — MAGNESIUM SULFATE 2 GM/50ML IV SOLN
2.0000 g | Freq: Once | INTRAVENOUS | Status: AC
  Administered 2015-11-01: 2 g via INTRAVENOUS
  Filled 2015-11-01: qty 50

## 2015-11-01 MED ORDER — FUROSEMIDE 10 MG/ML IJ SOLN
40.0000 mg | Freq: Three times a day (TID) | INTRAMUSCULAR | Status: AC
  Administered 2015-11-01 (×2): 40 mg via INTRAVENOUS
  Filled 2015-11-01 (×2): qty 4

## 2015-11-01 MED ORDER — IPRATROPIUM-ALBUTEROL 0.5-2.5 (3) MG/3ML IN SOLN: 3.0000 mL | RESPIRATORY_TRACT | Status: DC | PRN

## 2015-11-01 NOTE — ED Provider Notes (Signed)
CSN: [id number]     Arrival date & time 10/31/15  1712 History   First MD Initiated Contact with Patient 10/31/15 1729     Chief Complaint  Patient presents with  . Cough    x2 DAYS     (Consider location/radiation/quality/duration/timing/severity/associated sxs/prior Treatment) Patient is a 82 y.o. female presenting with cough.  Cough Cough characteristics:  Productive Sputum characteristics:  White Severity:  Moderate Duration:  2 days Timing:  Constant Progression:  Waxing and waning Chronicity:  New Relieved by:  Nothing Worsened by:  Nothing tried Ineffective treatments:  None tried Associated symptoms: rhinorrhea and shortness of breath   Associated symptoms: no chest pain, no chills, no fever, no headaches, no rash, no sore throat and no wheezing     Past Medical History  Diagnosis Date  . Syncope   . COPD (chronic obstructive pulmonary disease) ([place])   . DM (dermatomyositis)   . Orthostatic hypotension   . Systemic hypertension   . Thoracic kyphosis   . Scoliosis   . Pacemaker 07/13/2013    Her original pacemaker and the current leads were implanted in 1992. She has had 2 generator change out, most recently in 2010. Her device is a Buyer, retail 2110 non RF dual-chamber pacemaker with a battery longevity estimated at about 7 years. The atrial lead is a St. Jude [id number] and the ventricular lead was a Biotronik [id number].   [name] CHB (complete heart block) ([place]) 07/13/2013    Pacemaker dependent  . SSS (sick sinus syndrome) ([place]) 07/13/2013  . Paroxysmal atrial fibrillation ([place]) 09/04/2014  . CHF (congestive heart failure) [facility])    Past Surgical History  Procedure Laterality Date  . Permanent pacemaker generator change  03/27/2009    St.Jude  . Nm myocar perf wall motion  09/13/2011    Low risk  . US echocardiography  03/28/2012    Mod LAE,mild MR,aortic sclerosis w/mod AI,mod. TR,mild PI,Stage I diastolic dysfunction   History reviewed. No pertinent family  history. Social History  Substance Use Topics  . Smoking status: Never Smoker   . Smokeless tobacco: None  . Alcohol Use: No   OB History    No data available     Review of Systems  Constitutional: Negative for fever and chills.  HENT: Positive for rhinorrhea. Negative for sore throat.   Eyes: Negative for visual disturbance.  Respiratory: Positive for cough and shortness of breath. Negative for wheezing.   Cardiovascular: Negative for chest pain.  Gastrointestinal: Negative for nausea, vomiting, abdominal pain and diarrhea.  Genitourinary: Negative for difficulty urinating.  Musculoskeletal: Negative for back pain and neck pain.  Skin: Negative for rash.  Neurological: Negative for syncope and headaches.      Allergies  Review of patient's allergies indicates no known allergies.  Home Medications   Prior to Admission medications   Medication Sig Start Date End Date Taking? Authorizing Provider  alendronate (FOSAMAX) 70 MG tablet take 70mg s once weekly 08/18/15  Yes Historical Provider, MD  ALPRAZolam (XANAX) 0.25 MG tablet Take 0.25 mg by mouth at bedtime.    Yes Historical Provider, MD  apixaban (ELIQUIS) 2.5 MG TABS tablet Take 1 tablet (2.5 mg total) by mouth 2 (two) times daily. 08/29/15  Yes Barton Dubois, MD  escitalopram (LEXAPRO) 10 MG tablet take 10 mgs daily 08/10/15  Yes Historical Provider, MD  fludrocortisone (FLORINEF) 0.1 MG tablet Take 1 tablet (0.1 mg total) by mouth every morning. 09/22/15  Yes Mihai Croitoru, MD  guaiFENesin ([place]) 600  MG 12 hr tablet Take 1 tablet (600 mg total) by mouth 2 (two) times daily. 08/29/15  Yes Barton Dubois, MD  Ipratropium-Albuterol (COMBIVENT RESPIMAT) 20-100 MCG/ACT AERS respimat Inhale 1 puff into the lungs every 6 (six) hours as needed for wheezing or shortness of breath. 08/29/15  Yes Barton Dubois, MD  metoprolol succinate (TOPROL-XL) 25 MG 24 hr tablet Take 12.5-25 tablets by mouth 2 (two) times daily. Take 1 tablet in the  morning and 1/2 tablet in the evening 05/15/15  Yes Historical Provider, MD  mirtazapine (REMERON) 15 MG tablet Take 7.5 mg by mouth at bedtime.    Yes Historical Provider, MD  predniSONE (DELTASONE) 10 MG tablet Take 15-20 mg by mouth daily. Alternate between 15 and 20 mgs daily 10/13/15  Yes Historical Provider, MD  potassium chloride (K-DUR,KLOR-CON) 20 MEQ tablet Take 2 tablets (40 mEq total) by mouth daily. Patient not taking: Reported on 10/15/2015 08/29/15   Barton Dubois, MD   BP 158/55 mmHg  Pulse 74  Temp(Src) 98.2 F (36.8 C) (Oral)  Resp 20  Ht 5\' 5"  (1.651 m)  Wt 115 lb (52.164 kg)  BMI 19.14 kg/m2  SpO2 93% Physical Exam  Constitutional: She is oriented to person, place, and time. She appears well-developed and well-nourished. No distress.  HENT:  Head: Normocephalic and atraumatic.  Eyes: Conjunctivae and EOM are normal.  Neck: Normal range of motion. No JVD present.  Cardiovascular: Normal rate, regular rhythm, normal heart sounds and intact distal pulses.  Exam reveals no gallop and no friction rub.   No murmur heard. Pulmonary/Chest: Effort normal. No respiratory distress. She has no wheezes. She has rales.  Abdominal: Soft. She exhibits no distension. There is no tenderness. There is no guarding.  Musculoskeletal: She exhibits no edema or tenderness.  Neurological: She is alert and oriented to person, place, and time. No cranial nerve deficit. GCS eye subscore is 4. GCS verbal subscore is 5. GCS motor subscore is 6.  Skin: Skin is warm and dry. No rash noted. She is not diaphoretic. No erythema.  Nursing note and vitals reviewed.   ED Course  Procedures (including critical care time) Labs Review Labs Reviewed  CBC WITH DIFFERENTIAL/PLATELET - Abnormal; Notable for the following:    WBC 14.8 (*)    Hemoglobin 11.2 (*)    RDW 15.6 (*)    Neutro Abs 14.2 (*)    Lymphs Abs 0.3 (*)    All other components within normal limits  COMPREHENSIVE METABOLIC PANEL -  Abnormal; Notable for the following:    Potassium 3.4 (*)    Chloride 100 (*)    Glucose, Bld 218 (*)    Calcium 8.7 (*)    Total Protein 6.4 (*)    Albumin 3.4 (*)    All other components within normal limits  BRAIN NATRIURETIC PEPTIDE - Abnormal; Notable for the following:    B Natriuretic Peptide 907.1 (*)    All other components within normal limits  I-STAT CG4 LACTIC ACID, ED - Abnormal; Notable for the following:    Lactic Acid, Venous 2.36 (*)    All other components within normal limits  CULTURE, BLOOD (ROUTINE X 2)  CULTURE, BLOOD (ROUTINE X 2)  CULTURE, EXPECTORATED SPUTUM-ASSESSMENT  GRAM STAIN  BASIC METABOLIC PANEL  CBC  STREP PNEUMONIAE URINARY ANTIGEN  I-STAT TROPOININ, ED  I-STAT CG4 LACTIC ACID, ED  Randolm Idol, ED  Randolm Idol, ED    Imaging Review Dg Chest 2 View  10/31/2015  CLINICAL DATA:  SOB, cough. Low O2. H/o COPD, CHF, a-fib. Surgical h/o pacemaker. Nonsmoker. EXAM: CHEST  2 VIEW COMPARISON:  10/31/2015 FINDINGS: Right-sided transvenous pacemaker leads to the right atrium and right ventricle. Lungs are hyperinflated. Heart is enlarged. There is mild perihilar peribronchial thickening. There is stable small bilateral pleural effusions. Bilateral lower lobe infiltrates appear stable. No pulmonary edema. IMPRESSION: 1. Cardiomegaly. 2. Stable bilateral lower lobe infiltrates in small effusions. Electronically Signed   By: Nolon Nations M.D.   On: 10/31/2015 18:50   I have personally reviewed and evaluated these images and lab results as part of my medical decision-making.   EKG Interpretation   Date/Time:  Saturday Oct 31 2015 17:29:58 EDT Ventricular Rate:  83 PR Interval:  199 QRS Duration: 173 QT Interval:  452 QTC Calculation: 531 R Axis:   -78 Text Interpretation:  A-V dual-paced rhythm with some inhibition No  further analysis attempted due to paced rhythm No significant change since  last tracing Confirmed by [facility] MD, [place]  ([id number]) on 11/01/2015 2:18:11  AM      MDM   Final diagnoses:  HCAP (healthcare-associated pneumonia)  Chronic obstructive pulmonary disease, unspecified COPD type ([place])  Leukocytosis   82 year old female with a history of COPD, hypertension, pacemaker, paroxysmal atrial fibrillation presents with concern for productive cough for 2 days, shortness of breath.  Patient was evaluated at her primary care physician's office, and had chest x-ray was concerning for pneumonia and she sent to the emergency department. Chest x-ray was repeated here, is also concerning for pneumonia, and patient with leukocytosis, and given history, imaging and laboratory work, concern for pneumonia. Patient was admitted in the last 3 months, it would be classified as healthcare associated pneumonia, was given vancomycin and cefepime and IV fluids ordered per protocol.  Patient's BNP is also elevated, and she discontinued Lasix approximately 1 month ago, however feel history and exam are more concerning for pneumonia than congestive heart failure. Patient does not have peripheral edema, no JVD, no orthopnea.  Patient admitted to hospitalist for further care.   Gareth Morgan, MD 11/01/15 [phone]

## 2015-11-01 NOTE — Discharge Summary (Deleted)
PROGRESS NOTE  [name]  [id number] DOB: [birth year] DOA: 10/31/2015 PCP: Mathews Argyle, MD Outpatient Specialists:  Subjective: 82 year old female presented with cough and SOB, has paroxysmal A. fib she is on Eliquis  Brief Narrative:  [name] is a 82 y.o. female with a history of COPD, CHF, Paroxsymal Atrial Fibrillation on Eliquis Rx, HTN and Dermatomyositis who presents to the ED after being seen at the [facility] for productive Cough and SOB worsening over the past 3 days. He was found to have mild hypoxia at 92%, and a Chest X-ray was performed and revealed Bibasilar infiltrates. She was sent to the ED and was found to have Leukocytosis (14.8) and an elevated Lactic Acid of 2.36, and Shew as placed on IV antibiotic Rx of Vancomycin and Cefepime for HCAP since she had been hospitalized in 08/2015.  Assessment & Plan:   Principal Problem:   HCAP (healthcare-associated pneumonia) Active Problems:   Orthostatic hypotension   Paroxysmal atrial fibrillation (HCC)   Diastolic CHF (HCC)   Anticoagulant long-term use   DM (dermatomyositis)   COPD (chronic obstructive pulmonary disease) (HCC)   Leukocytosis   Benign essential HTN    ? HCAP Patient admitted to the hospital with productive cough, shortness of breath and leukocytosis. CXR showed bilateral infiltrates, appears chronic. Patient is started on cefepime and vancomycin, she denies any fever or chills and will hold antibiotics. I will continue bronchodilators, mucolytics, antitussives and oxygen as needed. Monitor off of antibiotics, keep negative fluid balance, stop IV fluids and give 1 dose of Lasix.  COPD History of COPD, does not have fever, chills or yellow sputum production. We'll discontinue antibiotics, hold steroids.  Chronic diastolic CHF Does not seem to have acute exacerbation, chest x-ray showed chronic bilateral infiltrates. Given IV fluids, I will discontinue it and give 1 dose of  Lasix to even the intake/output. Continue home medications.  Paroxysmal atrial fibrillation CHA2DS2-VASc is at least 4 because of female, CHF and 2 points for age above 94. Rate is controlled, she is on Eliquis, continued.  History of orthostatic hypotension Is on Florinef, continued.   DVT prophylaxis:  Code Status: Full Code Family Communication:  Disposition Plan:  Diet: Diet Heart Room service appropriate?: Yes; Fluid consistency:: Thin  Consultants:   None  Procedures:   None  Antimicrobials:   Cefepime and vancomycin   Objective: Filed Vitals:   10/31/15 2200 11/01/15 0220 11/01/15 0500 11/01/15 0809  BP: 173/63 158/55 164/66   Pulse: 69 74 70   Temp:   98.3 F (36.8 C)   TempSrc:   Oral   Resp: 20 20 18    Height:      Weight:      SpO2: 92% 93% 93% 96%    Intake/Output Summary (Last 24 hours) at 11/01/15 1023 Last data filed at 11/01/15 0500  Gross per 24 hour  Intake 2375.83 ml  Output    375 ml  Net 2000.83 ml   Filed Weights   10/31/15 1724  Weight: 52.164 kg (115 lb)    Examination: General exam: Appears calm and comfortable  Respiratory system: Clear to auscultation. Respiratory effort normal. Cardiovascular system: S1 & S2 heard, RRR. No JVD, murmurs, rubs, gallops or clicks. No pedal edema. Gastrointestinal system: Abdomen is nondistended, soft and nontender. No organomegaly or masses felt. Normal bowel sounds heard. Central nervous system: Alert and oriented. No focal neurological deficits. Extremities: Symmetric 5 x 5 power. Skin: No rashes, lesions or ulcers Psychiatry: Judgement and insight  appear normal. Mood & affect appropriate.   Data Reviewed: I have personally reviewed following labs and imaging studies  CBC:  Recent Labs Lab 10/31/15 1818 11/01/15 0504  WBC 14.8* 12.7*  NEUTROABS 14.2*  --   HGB 11.2* 10.1*  HCT 36.3 31.9*  MCV 91.2 90.4  PLT 315 0000000   Basic Metabolic Panel:  Recent Labs Lab 10/31/15 1818  11/01/15 0504  NA 140 144  K 3.4* 3.3*  CL 100* 107  CO2 28 30  GLUCOSE 218* 147*  BUN 14 13  CREATININE 0.67 0.58  CALCIUM 8.7* 8.3*   GFR: Estimated Creatinine Clearance: 44.7 mL/min (by C-G formula based on Cr of 0.58). Liver Function Tests:  Recent Labs Lab 10/31/15 1818  AST 22  ALT 19  ALKPHOS 59  BILITOT 0.9  PROT 6.4*  ALBUMIN 3.4*   No results for input(s): LIPASE, AMYLASE in the last 168 hours. No results for input(s): AMMONIA in the last 168 hours. Coagulation Profile: No results for input(s): INR, PROTIME in the last 168 hours. Cardiac Enzymes: No results for input(s): CKTOTAL, CKMB, CKMBINDEX, TROPONINI in the last 168 hours. BNP (last 3 results) No results for input(s): PROBNP in the last 8760 hours. HbA1C: No results for input(s): HGBA1C in the last 72 hours. CBG: No results for input(s): GLUCAP in the last 168 hours. Lipid Profile: No results for input(s): CHOL, HDL, LDLCALC, TRIG, CHOLHDL, LDLDIRECT in the last 72 hours. Thyroid Function Tests: No results for input(s): TSH, T4TOTAL, FREET4, T3FREE, THYROIDAB in the last 72 hours. Anemia Panel: No results for input(s): VITAMINB12, FOLATE, FERRITIN, TIBC, IRON, RETICCTPCT in the last 72 hours. Urine analysis:    Component Value Date/Time   COLORURINE YELLOW 06/07/2015 0254   APPEARANCEUR CLOUDY* 06/07/2015 0254   LABSPEC 1.014 06/07/2015 0254   PHURINE 6.0 06/07/2015 0254   GLUCOSEU 500* 06/07/2015 0254   HGBUR LARGE* 06/07/2015 0254   BILIRUBINUR NEGATIVE 06/07/2015 0254   KETONESUR NEGATIVE 06/07/2015 0254   PROTEINUR NEGATIVE 06/07/2015 0254   UROBILINOGEN 1.0 01/19/2011 1030   NITRITE NEGATIVE 06/07/2015 0254   LEUKOCYTESUR NEGATIVE 06/07/2015 0254   Sepsis Labs: @LABRCNTIP (procalcitonin:4,lacticidven:4)  ) Recent Results (from the past 240 hour(s))  Blood culture (routine x 2)     Status: None (Preliminary result)   Collection Time: 10/31/15  6:14 PM  Result Value Ref Range Status     Specimen Description BLOOD LEFT FOREARM  Final   Special Requests BOTTLES DRAWN AEROBIC AND ANAEROBIC 5CC  Final   Culture PENDING  Incomplete   Report Status PENDING  Incomplete  Blood culture (routine x 2)     Status: None (Preliminary result)   Collection Time: 10/31/15  6:18 PM  Result Value Ref Range Status   Specimen Description BLOOD RIGHT ANTECUBITAL  Final   Special Requests BOTTLES DRAWN AEROBIC AND ANAEROBIC 5CC  Final   Culture PENDING  Incomplete   Report Status PENDING  Incomplete     Invalid input(s): PROCALCITONIN, LACTICACIDVEN   Radiology Studies: Dg Chest 2 View  10/31/2015  CLINICAL DATA:  SOB, cough. Low O2. H/o COPD, CHF, a-fib. Surgical h/o pacemaker. Nonsmoker. EXAM: CHEST  2 VIEW COMPARISON:  10/31/2015 FINDINGS: Right-sided transvenous pacemaker leads to the right atrium and right ventricle. Lungs are hyperinflated. Heart is enlarged. There is mild perihilar peribronchial thickening. There is stable small bilateral pleural effusions. Bilateral lower lobe infiltrates appear stable. No pulmonary edema. IMPRESSION: 1. Cardiomegaly. 2. Stable bilateral lower lobe infiltrates in small effusions. Electronically Signed  By: Nolon Nations M.D.   On: 10/31/2015 18:50        Scheduled Meds: . ALPRAZolam  0.25 mg Oral QHS  . apixaban  2.5 mg Oral BID  . ceFEPime (MAXIPIME) IV  1 g Intravenous Q12H  . escitalopram  10 mg Oral Daily  . fludrocortisone  0.1 mg Oral q morning - 10a  . ipratropium-albuterol  3 mL Nebulization TID  . metoprolol succinate  12.5 mg Oral QHS  . metoprolol succinate  25 mg Oral Daily  . mirtazapine  7.5 mg Oral QHS  . potassium chloride  40 mEq Oral Q6H  . [START ON 11/02/2015] predniSONE  15 mg Oral Q48H  . predniSONE  20 mg Oral Q48H  . sodium chloride flush  3 mL Intravenous Q12H  . vancomycin  500 mg Intravenous Q12H   Continuous Infusions:    LOS: 1 day    Time spent: 35 minutes    Carden Teel A, MD [facility] Pager [phone]  If 7PM-7AM, please contact night-coverage [email or web address] Password [facility] 11/01/2015, 10:23 AM

## 2015-11-02 LAB — BASIC METABOLIC PANEL
Anion gap: 7 (ref 5–15)
BUN: 14 mg/dL (ref 6–20)
CALCIUM: 8.2 mg/dL — AB (ref 8.9–10.3)
CO2: 33 mmol/L — ABNORMAL HIGH (ref 22–32)
CREATININE: 0.66 mg/dL (ref 0.44–1.00)
Chloride: 104 mmol/L (ref 101–111)
Glucose, Bld: 88 mg/dL (ref 65–99)
Potassium: 3.1 mmol/L — ABNORMAL LOW (ref 3.5–5.1)
SODIUM: 144 mmol/L (ref 135–145)

## 2015-11-02 LAB — MAGNESIUM: Magnesium: 1.9 mg/dL (ref 1.7–2.4)

## 2015-11-02 MED ORDER — FUROSEMIDE 40 MG PO TABS: 40.0000 mg | ORAL_TABLET | Freq: Every day | ORAL | Status: DC

## 2015-11-02 MED ORDER — POTASSIUM CHLORIDE CRYS ER 20 MEQ PO TBCR: 40.0000 meq | EXTENDED_RELEASE_TABLET | Freq: Every day | ORAL | Status: DC

## 2015-11-02 MED ORDER — POTASSIUM CHLORIDE CRYS ER 20 MEQ PO TBCR
60.0000 meq | EXTENDED_RELEASE_TABLET | Freq: Four times a day (QID) | ORAL | Status: DC
  Administered 2015-11-02: 60 meq via ORAL
  Filled 2015-11-02: qty 3

## 2015-11-02 MED ORDER — FUROSEMIDE 10 MG/ML IJ SOLN
40.0000 mg | Freq: Once | INTRAMUSCULAR | Status: AC
  Administered 2015-11-02: 40 mg via INTRAVENOUS
  Filled 2015-11-02: qty 4

## 2015-11-02 NOTE — Progress Notes (Signed)
PROGRESS NOTE  [name]  [id number] DOB: 1931-01-25 DOA: 10/31/2015 PCP: Mathews Argyle, MD Outpatient Specialists:  Subjective: 82 year old female presented with cough and SOB, has paroxysmal A. fib she is on Eliquis  Brief Narrative:  [name] is a 82 y.o. female with a history of COPD, CHF, Paroxsymal Atrial Fibrillation on Eliquis Rx, HTN and Dermatomyositis who presents to the ED after being seen at the [facility] for productive Cough and SOB worsening over the past 3 days. He was found to have mild hypoxia at 92%, and a Chest X-ray was performed and revealed Bibasilar infiltrates. She was sent to the ED and was found to have Leukocytosis (14.8) and an elevated Lactic Acid of 2.36, and Shew as placed on IV antibiotic Rx of Vancomycin and Cefepime for HCAP since she had been hospitalized in 08/2015.  Assessment & Plan:   Principal Problem:   HCAP (healthcare-associated pneumonia) Active Problems:   Orthostatic hypotension   Paroxysmal atrial fibrillation (HCC)   Diastolic CHF (HCC)   Anticoagulant long-term use   DM (dermatomyositis)   COPD (chronic obstructive pulmonary disease) (HCC)   Leukocytosis   Benign essential HTN    ? HCAP Patient admitted to the hospital with productive cough, shortness of breath and leukocytosis. CXR showed bilateral infiltrates, appears chronic. Patient is started on cefepime and vancomycin, she denies any fever or chills and will hold antibiotics. I will continue bronchodilators, mucolytics, antitussives and oxygen as needed. Monitor off of antibiotics, keep negative fluid balance, stop IV fluids and give 1 dose of Lasix.  COPD History of COPD, does not have fever, chills or yellow sputum production. We'll discontinue antibiotics, hold steroids.  Chronic diastolic CHF Does not seem to have acute exacerbation, chest x-ray showed chronic bilateral infiltrates. Given IV fluids, I will discontinue it and give 1 dose of  Lasix to even the intake/output. Continue home medications.  Paroxysmal atrial fibrillation CHA2DS2-VASc is at least 4 because of female, CHF and 2 points for age above 24. Rate is controlled, she is on Eliquis, continued.  History of orthostatic hypotension Is on Florinef, continued.   DVT prophylaxis:  Code Status: Full Code Family Communication:  Disposition Plan:  Diet: Diet Heart Room service appropriate?: Yes; Fluid consistency:: Thin Diet - low sodium heart healthy  Consultants:   None  Procedures:   None  Antimicrobials:   Cefepime and vancomycin   Objective: Filed Vitals:   11/02/15 0149 11/02/15 0500 11/02/15 0544 11/02/15 1100  BP: 163/75  148/82 149/58  Pulse: 74  72 69  Temp: 99.2 F (37.3 C)  98.1 F (36.7 C)   TempSrc: Oral  Oral   Resp: 20     Height:      Weight:  54.522 kg (120 lb 3.2 oz)    SpO2: 95%  99%     Intake/Output Summary (Last 24 hours) at 11/02/15 1207 Last data filed at 11/02/15 1020  Gross per 24 hour  Intake    240 ml  Output   3175 ml  Net  -2935 ml   Filed Weights   10/31/15 1724 11/01/15 2100 11/02/15 0500  Weight: 52.164 kg (115 lb) 55.384 kg (122 lb 1.6 oz) 54.522 kg (120 lb 3.2 oz)    Examination: General exam: Appears calm and comfortable  Respiratory system: Clear to auscultation. Respiratory effort normal. Cardiovascular system: S1 & S2 heard, RRR. No JVD, murmurs, rubs, gallops or clicks. No pedal edema. Gastrointestinal system: Abdomen is nondistended, soft and nontender. No organomegaly  or masses felt. Normal bowel sounds heard. Central nervous system: Alert and oriented. No focal neurological deficits. Extremities: Symmetric 5 x 5 power. Skin: No rashes, lesions or ulcers Psychiatry: Judgement and insight appear normal. Mood & affect appropriate.   Data Reviewed: I have personally reviewed following labs and imaging studies  CBC:  Recent Labs Lab 10/31/15 1818 11/01/15 0504  WBC 14.8* 12.7*    NEUTROABS 14.2*  --   HGB 11.2* 10.1*  HCT 36.3 31.9*  MCV 91.2 90.4  PLT 315 0000000   Basic Metabolic Panel:  Recent Labs Lab 10/31/15 1818 11/01/15 0504 11/02/15 0405 11/02/15 0757  NA 140 144  --  144  K 3.4* 3.3*  --  3.1*  CL 100* 107  --  104  CO2 28 30  --  33*  GLUCOSE 218* 147*  --  88  BUN 14 13  --  14  CREATININE 0.67 0.58  --  0.66  CALCIUM 8.7* 8.3*  --  8.2*  MG  --   --  1.9  --    GFR: Estimated Creatinine Clearance: 46.6 mL/min (by C-G formula based on Cr of 0.66). Liver Function Tests:  Recent Labs Lab 10/31/15 1818  AST 22  ALT 19  ALKPHOS 59  BILITOT 0.9  PROT 6.4*  ALBUMIN 3.4*   No results for input(s): LIPASE, AMYLASE in the last 168 hours. No results for input(s): AMMONIA in the last 168 hours. Coagulation Profile: No results for input(s): INR, PROTIME in the last 168 hours. Cardiac Enzymes: No results for input(s): CKTOTAL, CKMB, CKMBINDEX, TROPONINI in the last 168 hours. BNP (last 3 results) No results for input(s): PROBNP in the last 8760 hours. HbA1C: No results for input(s): HGBA1C in the last 72 hours. CBG: No results for input(s): GLUCAP in the last 168 hours. Lipid Profile: No results for input(s): CHOL, HDL, LDLCALC, TRIG, CHOLHDL, LDLDIRECT in the last 72 hours. Thyroid Function Tests: No results for input(s): TSH, T4TOTAL, FREET4, T3FREE, THYROIDAB in the last 72 hours. Anemia Panel: No results for input(s): VITAMINB12, FOLATE, FERRITIN, TIBC, IRON, RETICCTPCT in the last 72 hours. Urine analysis:    Component Value Date/Time   COLORURINE YELLOW 06/07/2015 0254   APPEARANCEUR CLOUDY* 06/07/2015 0254   LABSPEC 1.014 06/07/2015 0254   PHURINE 6.0 06/07/2015 0254   GLUCOSEU 500* 06/07/2015 0254   HGBUR LARGE* 06/07/2015 0254   BILIRUBINUR NEGATIVE 06/07/2015 0254   KETONESUR NEGATIVE 06/07/2015 0254   PROTEINUR NEGATIVE 06/07/2015 0254   UROBILINOGEN 1.0 01/19/2011 1030   NITRITE NEGATIVE 06/07/2015 0254    LEUKOCYTESUR NEGATIVE 06/07/2015 0254   Sepsis Labs: @LABRCNTIP (procalcitonin:4,lacticidven:4)  ) Recent Results (from the past 240 hour(s))  Blood culture (routine x 2)     Status: None (Preliminary result)   Collection Time: 10/31/15  6:14 PM  Result Value Ref Range Status   Specimen Description BLOOD LEFT FOREARM  Final   Special Requests BOTTLES DRAWN AEROBIC AND ANAEROBIC 5CC  Final   Culture   Final    NO GROWTH < 12 HOURS Performed at [facility]    Report Status PENDING  Incomplete  Blood culture (routine x 2)     Status: None (Preliminary result)   Collection Time: 10/31/15  6:18 PM  Result Value Ref Range Status   Specimen Description BLOOD RIGHT ANTECUBITAL  Final   Special Requests BOTTLES DRAWN AEROBIC AND ANAEROBIC 5CC  Final   Culture   Final    NO GROWTH < 12 HOURS Performed at  [facility]    Report Status PENDING  Incomplete     Invalid input(s): PROCALCITONIN, LACTICACIDVEN   Radiology Studies: Dg Chest 2 View  10/31/2015  CLINICAL DATA:  SOB, cough. Low O2. H/o COPD, CHF, a-fib. Surgical h/o pacemaker. Nonsmoker. EXAM: CHEST  2 VIEW COMPARISON:  10/31/2015 FINDINGS: Right-sided transvenous pacemaker leads to the right atrium and right ventricle. Lungs are hyperinflated. Heart is enlarged. There is mild perihilar peribronchial thickening. There is stable small bilateral pleural effusions. Bilateral lower lobe infiltrates appear stable. No pulmonary edema. IMPRESSION: 1. Cardiomegaly. 2. Stable bilateral lower lobe infiltrates in small effusions. Electronically Signed   By: Nolon Nations M.D.   On: 10/31/2015 18:50        Scheduled Meds: . ALPRAZolam  0.25 mg Oral QHS  . apixaban  2.5 mg Oral BID  . escitalopram  10 mg Oral Daily  . fludrocortisone  0.1 mg Oral q morning - 10a  . metoprolol succinate  12.5 mg Oral QHS  . metoprolol succinate  25 mg Oral Daily  . mirtazapine  7.5 mg Oral QHS  . potassium chloride  60 mEq Oral Q6H  .  predniSONE  15 mg Oral Q48H  . predniSONE  20 mg Oral Q48H  . sodium chloride flush  3 mL Intravenous Q12H   Continuous Infusions:    LOS: 2 days    Time spent: 35 minutes    Adolphus Hanf A, MD [facility] Pager [phone]  If 7PM-7AM, please contact night-coverage [email or web address] Password [facility] 11/02/2015, 12:07 PM

## 2015-11-02 NOTE — Progress Notes (Signed)
Patient is alert and oriented x4, ambulatory without assist.  Discharge instructions reviewed, questions, concerns denied. Pt's skin very thin, encouraged to removed while in shower.

## 2015-11-02 NOTE — Consult Note (Signed)
   [facility] CM Inpatient Consult   11/02/2015  [name] Nov 15, 1930 [id number]    Patient is currently active with [facility] program for long-term disease management services. Patient will receive post hospital discharge transition of care calls and will be assessed for monthly home visits for assessments and for education. Will make inpatient RNCM aware patient is active with [facility]. Please see chart review tab then notes for further patient outreach details by [place].   Spoke with [name] at bedside. She is agreeable to ongoing [facility] follow up. However, she states " I do not want daily automated calls however". She was speaking EMMI transition calls. Made her aware that the [place]  post hospital discharge telephone calls will not be daily. She is agreeable to this and for ongoing monthly home visits if needed. Contact information left at bedside.  Marthenia Rolling, MSN-Ed, RN,BSN [facility] Liaison [phone]

## 2015-11-02 NOTE — Discharge Summary (Signed)
Physician Discharge Summary  [name] [id number] DOB: 1931/03/17 DOA: 10/31/2015  PCP: Mathews Argyle, MD  Admit date: 10/31/2015 Discharge date: 11/02/2015  Time spent: 40 minutes  Recommendations for Outpatient Follow-up:  1. Follow-up with cardiology on Wednesday, 11/04/2015. 2. Check BMP follow renal function and potassium.   Discharge Diagnoses:  Principal Problem:   HCAP (healthcare-associated pneumonia) Active Problems:   Orthostatic hypotension   Paroxysmal atrial fibrillation (HCC)   Diastolic CHF (HCC)   Anticoagulant long-term use   DM (dermatomyositis)   COPD (chronic obstructive pulmonary disease) (HCC)   Leukocytosis   Benign essential HTN   Discharge Condition: Stable  Diet recommendation: Heart healthy  Filed Weights   10/31/15 1724 11/01/15 2100 11/02/15 0500  Weight: 52.164 kg (115 lb) 55.384 kg (122 lb 1.6 oz) 54.522 kg (120 lb 3.2 oz)    History of present illness:  [name] is a 82 y.o. female with a history of COPD, CHF, Paroxsymal Atrial Fibrillation on Eliquis Rx, HTN and Dermatomyositis who presents to the ED after being seen at the [facility] for productive Cough and SOB worsening over the past 3 days. He was found to have mild hypoxia at 92%, and a Chest X-ray was performed and revealed Bibasilar infiltrates. She was sent to the ED and was found to have Leukocytosis (14.8) and an elevated Lactic Acid of 2.36, and Shew as placed on IV antibiotic Rx of Vancomycin and Cefepime for HCAP since she had been hospitalized in 08/2015.   Hospital Course:   Acute on chronic diastolic CHF She seems to have very mild exacerbation, chest x-ray showed chronic bilateral infiltrates. BNP above baseline at 907. Given IV fluids, I will discontinue it and give 1 dose of Lasix to even the intake/output. Although her intake/output showed -1.2 L at the time of discharge, her weight did not reflect that. She feels much better than the day of  admission, discharge on Lasix 40 mg and potassium supplement. Please note that she requested to be discharged, she has to attend her late son [name] was a Engineer, structural and killed on the line of duty. She has to attend this on 11/03/2015. I have called the cardiology clinic and schedule her to see Mrs. Servando Snare on 11/04/2015. Compression stockings as well.  ? HCAP Patient admitted to the hospital with productive cough, shortness of breath and leukocytosis. CXR showed bilateral infiltrates, appears chronic. Patient is started on cefepime and vancomycin, she denies any fever or chills and will hold antibiotics. I will continue bronchodilators, mucolytics, antitussives and oxygen as needed. Monitor off of antibiotics, I discontinued IV antibiotics while she was in the hospital, I do NOT think she has pneumonia.  COPD History of COPD, does not have fever, chills or yellow sputum production. We'll discontinue antibiotics, hold steroids.  Paroxysmal atrial fibrillation CHA2DS2-VASc is at least 4 because of female, CHF and 2 points for age above 42. Rate is controlled, she is on Eliquis, continued.  History of orthostatic hypotension Patient is on Florinef, this is the reason that she was taken off of Lasix on 09/21/2015. Continue Florinef, Lasix restarted, will try compression stockings.   Procedures:  None  Consultations:  None  Discharge Exam: Filed Vitals:   11/02/15 0149 11/02/15 0544  BP: 163/75 148/82  Pulse: 74 72  Temp: 99.2 F (37.3 C) 98.1 F (36.7 C)  Resp: 20   General: Alert and awake, oriented x3, not in any acute distress. HEENT: anicteric sclera, pupils reactive to light and  accommodation, EOMI CVS: S1-S2 clear, no murmur rubs or gallops Chest: clear to auscultation bilaterally, no wheezing, rales or rhonchi Abdomen: soft nontender, nondistended, normal bowel sounds, no organomegaly Extremities: no cyanosis, clubbing or edema noted bilaterally Neuro:  Cranial nerves II-XII intact, no focal neurological deficits  Discharge Instructions   Discharge Instructions    Compression stockings    Complete by:  As directed      Diet - low sodium heart healthy    Complete by:  As directed      Increase activity slowly    Complete by:  As directed           Current Discharge Medication List    START taking these medications   Details  furosemide (LASIX) 40 MG tablet Take 1 tablet (40 mg total) by mouth daily. Qty: 30 tablet, Refills: 0      CONTINUE these medications which have CHANGED   Details  potassium chloride SA (K-DUR,KLOR-CON) 20 MEQ tablet Take 2 tablets (40 mEq total) by mouth daily. Qty: 60 tablet, Refills: 2      CONTINUE these medications which have NOT CHANGED   Details  alendronate (FOSAMAX) 70 MG tablet take 70mg s once weekly Refills: 12    ALPRAZolam (XANAX) 0.25 MG tablet Take 0.25 mg by mouth at bedtime.     apixaban (ELIQUIS) 2.5 MG TABS tablet Take 1 tablet (2.5 mg total) by mouth 2 (two) times daily. Qty: 60 tablet, Refills: 1    escitalopram (LEXAPRO) 10 MG tablet take 10 mgs daily Refills: 6    fludrocortisone (FLORINEF) 0.1 MG tablet Take 1 tablet (0.1 mg total) by mouth every morning. Qty: 60 tablet, Refills: 11    guaiFENesin (MUCINEX) 600 MG 12 hr tablet Take 1 tablet (600 mg total) by mouth 2 (two) times daily. Qty: 40 tablet, Refills: 0    Ipratropium-Albuterol (COMBIVENT RESPIMAT) 20-100 MCG/ACT AERS respimat Inhale 1 puff into the lungs every 6 (six) hours as needed for wheezing or shortness of breath. Qty: 1 Inhaler, Refills: 2    metoprolol succinate (TOPROL-XL) 25 MG 24 hr tablet Take 12.5-25 tablets by mouth 2 (two) times daily. Take 1 tablet in the morning and 1/2 tablet in the evening Refills: 3    mirtazapine (REMERON) 15 MG tablet Take 7.5 mg by mouth at bedtime.     predniSONE (DELTASONE) 10 MG tablet Take 15-20 mg by mouth daily. Alternate between 15 and 20 mgs daily Refills: 3        No Known Allergies Follow-up Information    Follow up with Mathews Argyle, MD In 1 week.   Specialty:  Internal Medicine   Contact information:   [address] [place] [address] [phone]       Follow up with Truitt Merle, NP On 11/04/2015.   Specialties:  Nurse Practitioner, Interventional Cardiology, Cardiology, Radiology   Why:  @ 2:30   Contact information:   [address] [place] [address] [phone]        The results of significant diagnostics from this hospitalization (including imaging, microbiology, ancillary and laboratory) are listed below for reference.    Significant Diagnostic Studies: Dg Chest 2 View  10/31/2015  CLINICAL DATA:  SOB, cough. Low O2. H/o COPD, CHF, a-fib. Surgical h/o pacemaker. Nonsmoker. EXAM: CHEST  2 VIEW COMPARISON:  10/31/2015 FINDINGS: Right-sided transvenous pacemaker leads to the right atrium and right ventricle. Lungs are hyperinflated. Heart is enlarged. There is mild perihilar peribronchial thickening. There  is stable small bilateral pleural effusions. Bilateral lower lobe infiltrates appear stable. No pulmonary edema. IMPRESSION: 1. Cardiomegaly. 2. Stable bilateral lower lobe infiltrates in small effusions. Electronically Signed   By: Nolon Nations M.D.   On: 10/31/2015 18:50    Microbiology: Recent Results (from the past 240 hour(s))  Blood culture (routine x 2)     Status: None (Preliminary result)   Collection Time: 10/31/15  6:14 PM  Result Value Ref Range Status   Specimen Description BLOOD LEFT FOREARM  Final   Special Requests BOTTLES DRAWN AEROBIC AND ANAEROBIC 5CC  Final   Culture   Final    NO GROWTH < 12 HOURS Performed at [facility]    Report Status PENDING  Incomplete  Blood culture (routine x 2)     Status: None (Preliminary result)   Collection Time: 10/31/15  6:18 PM  Result Value Ref Range Status   Specimen Description BLOOD RIGHT ANTECUBITAL   Final   Special Requests BOTTLES DRAWN AEROBIC AND ANAEROBIC 5CC  Final   Culture   Final    NO GROWTH < 12 HOURS Performed at [facility]    Report Status PENDING  Incomplete     Labs: Basic Metabolic Panel:  Recent Labs Lab 10/31/15 1818 11/01/15 0504 11/02/15 0405 11/02/15 0757  NA 140 144  --  144  K 3.4* 3.3*  --  3.1*  CL 100* 107  --  104  CO2 28 30  --  33*  GLUCOSE 218* 147*  --  88  BUN 14 13  --  14  CREATININE 0.67 0.58  --  0.66  CALCIUM 8.7* 8.3*  --  8.2*  MG  --   --  1.9  --    Liver Function Tests:  Recent Labs Lab 10/31/15 1818  AST 22  ALT 19  ALKPHOS 59  BILITOT 0.9  PROT 6.4*  ALBUMIN 3.4*   No results for input(s): LIPASE, AMYLASE in the last 168 hours. No results for input(s): AMMONIA in the last 168 hours. CBC:  Recent Labs Lab 10/31/15 1818 11/01/15 0504  WBC 14.8* 12.7*  NEUTROABS 14.2*  --   HGB 11.2* 10.1*  HCT 36.3 31.9*  MCV 91.2 90.4  PLT 315 293   Cardiac Enzymes: No results for input(s): CKTOTAL, CKMB, CKMBINDEX, TROPONINI in the last 168 hours. BNP: BNP (last 3 results)  Recent Labs  08/25/15 2217 08/29/15 0538 10/31/15 1818  BNP 774.8* 288.6* 907.1*    ProBNP (last 3 results) No results for input(s): PROBNP in the last 8760 hours.  CBG: No results for input(s): GLUCAP in the last 168 hours.     Signed:  Birdie Hopes MD.  [facility] 11/02/2015, 10:54 AM

## 2015-11-02 NOTE — Telephone Encounter (Signed)
F/u  Pt dtr calling to speak w/ RN concerning her hospital follow up. Offered next avail appt w/ APP- pt dtr requested to speak w/ RN. Please call back and discuss.

## 2015-11-02 NOTE — Plan of Care (Signed)
Problem: Education: Goal: Ability to demonstrate managment of disease process will improve Outcome: Completed/Met Date Met:  11/02/15 Packet reviewed. Pt encouraged to adhere to this plan of care to assist with admission prevention.

## 2015-11-02 NOTE — Telephone Encounter (Signed)
Left message to call back  

## 2015-11-03 ENCOUNTER — Other Ambulatory Visit: Payer: Self-pay

## 2015-11-03 NOTE — Patient Outreach (Signed)
[facility]) Care Management  11/03/2015  [name] 14-Oct-1930 [id number]  82 year old with admission in December 2016 with community acquired pneumonia; 3/7-3/11 with respiratory failure-COPD exacerbation/acute on chronic heart failure. Member with recent admission 5/13-5/15 with healthcare associated pneumonia. RNCM called to complete transition of care. No answer/busy. Unable to leave message.  Plan: RNCM will continue to attempt to reach member.  [name], RN, MSN, [place] Coordinator Cell: [phone]

## 2015-11-03 NOTE — Telephone Encounter (Signed)
Spoke with pt, she is aware of her appointment tomorrow with lori gerhardt np.

## 2015-11-04 ENCOUNTER — Other Ambulatory Visit: Payer: Self-pay

## 2015-11-04 ENCOUNTER — Ambulatory Visit (INDEPENDENT_AMBULATORY_CARE_PROVIDER_SITE_OTHER): Payer: Medicare Other | Admitting: Nurse Practitioner

## 2015-11-04 ENCOUNTER — Encounter: Payer: Self-pay | Admitting: Nurse Practitioner

## 2015-11-04 VITALS — BP 98/58 | HR 68 | Ht 65.0 in | Wt 121.0 lb

## 2015-11-04 DIAGNOSIS — I480 Paroxysmal atrial fibrillation: Secondary | ICD-10-CM

## 2015-11-04 DIAGNOSIS — I509 Heart failure, unspecified: Secondary | ICD-10-CM | POA: Diagnosis not present

## 2015-11-04 DIAGNOSIS — I5032 Chronic diastolic (congestive) heart failure: Secondary | ICD-10-CM

## 2015-11-04 DIAGNOSIS — I4891 Unspecified atrial fibrillation: Secondary | ICD-10-CM | POA: Diagnosis not present

## 2015-11-04 LAB — CBC
HCT: 34.6 % — ABNORMAL LOW (ref 35.0–45.0)
Hemoglobin: 11 g/dL — ABNORMAL LOW (ref 11.7–15.5)
MCH: 28.1 pg (ref 27.0–33.0)
MCHC: 31.8 g/dL — ABNORMAL LOW (ref 32.0–36.0)
MCV: 88.3 fL (ref 80.0–100.0)
MPV: 10.8 fL (ref 7.5–12.5)
Platelets: 418 10*3/uL — ABNORMAL HIGH (ref 140–400)
RBC: 3.92 MIL/uL (ref 3.80–5.10)
RDW: 15 % (ref 11.0–15.0)
WBC: 10.9 10*3/uL — ABNORMAL HIGH (ref 3.8–10.8)

## 2015-11-04 LAB — BASIC METABOLIC PANEL
BUN: 26 mg/dL — ABNORMAL HIGH (ref 7–25)
CO2: 29 mmol/L (ref 20–31)
Calcium: 8.8 mg/dL (ref 8.6–10.4)
Chloride: 102 mmol/L (ref 98–110)
Creat: 0.84 mg/dL (ref 0.60–0.88)
Glucose, Bld: 165 mg/dL — ABNORMAL HIGH (ref 65–99)
Potassium: 4.9 mmol/L (ref 3.5–5.3)
Sodium: 141 mmol/L (ref 135–146)

## 2015-11-04 MED ORDER — FUROSEMIDE 40 MG PO TABS: 40.0000 mg | ORAL_TABLET | Freq: Every day | ORAL | Status: DC | PRN

## 2015-11-04 NOTE — Progress Notes (Signed)
CARDIOLOGY OFFICE NOTE  Date:  11/04/2015    [name] Date of Birth: [birth year] Medical Record [id number]  PCP:  Mathews Argyle, MD  Cardiologist:  Cro[place]    Chief Complaint  Patient presents with  . Congestive Heart Failure    Post hospital visit - seen for Dr. Sallyanne Kuster    History of Present Illness: [name] is a 82 y.o. female who presents today for a post hospital visit. Seen for Dr. Sallyanne Kuster.   She has a history of diastolic HF, COPD, PAF, on anticoagulation with Eliquis, HTN and dermatomyositis.   Saw Dr. Sallyanne Kuster about a month ago. She was taken off of Lasix at that time.   Presented to the ER earlier this month after being seen at the [facility] for productive cough and SOB worsening. Found to have mild hypoxia at 92%, and a CXR with bibasilar infiltrates. She was sent to the ED and was found to have leukocytosis (14.8) and an elevated Lactic Acid of 2.36, and was treated with antibiotics for pneumonia. She has been placed back on Lasix.   Comes back today. Here with her daughter. Hasn't even been home 2 whole days yet. Went to get her hair done right after discharge.  Then went to attend her late son's memorial as a Engineer, structural who was killed in the line of duty.  More upset about her bruises that she has moreso than anything else. Breathing is fine. No cough. No fever or chills. Daughter concerned about this diagnosis of pneumonia - was listed as the primary diagnosis but then in the D/C summary it say she did not have. Daughter notes that this is the second time she has "gotten in to trouble" - both spells happened after she had gone out of town for several days - ate/drank too much. She would like to not use lasix all the time. She wants to know about her dose of potassium. She does not know why she is anemic. Her weight is back to her normal weight - 116# - at home.  Past Medical History  Diagnosis Date  . Syncope   . COPD (chronic  obstructive pulmonary disease) ([place])   . DM (dermatomyositis)   . Orthostatic hypotension   . Systemic hypertension   . Thoracic kyphosis   . Scoliosis   . Pacemaker 07/13/2013    Her original pacemaker and the current leads were implanted in 1992. She has had 2 generator change out, most recently in 2010. Her device is a Buyer, retail 2110 non RF dual-chamber pacemaker with a battery longevity estimated at about 7 years. The atrial lead is a St. Jude [id number] and the ventricular lead was a Biotronik [id number].   [name] CHB (complete heart block) ([place]) 07/13/2013    Pacemaker dependent  . SSS (sick sinus syndrome) ([place]) 07/13/2013  . Paroxysmal atrial fibrillation ([place]) 09/04/2014  . CHF (congestive heart failure) [facility]     Past Surgical History  Procedure Laterality Date  . Permanent pacemaker generator change  03/27/2009    St.Jude  . Nm myocar perf wall motion  09/13/2011    Low risk  . US echocardiography  03/28/2012    Mod LAE,mild MR,aortic sclerosis w/mod AI,mod. TR,mild PI,Stage I diastolic dysfunction     Medications: Current Outpatient Prescriptions  Medication Sig Dispense Refill  . alendronate (FOSAMAX) 70 MG tablet take 70mg s once weekly  12  . ALPRAZolam (XANAX) 0.25 MG tablet Take 0.25 mg by mouth at  bedtime.     [name] apixaban (ELIQUIS) 2.5 MG TABS tablet Take 1 tablet (2.5 mg total) by mouth 2 (two) times daily. 60 tablet 1  . escitalopram (LEXAPRO) 10 MG tablet take 10 mgs daily  6  . fludrocortisone (FLORINEF) 0.1 MG tablet Take 1 tablet (0.1 mg total) by mouth every morning. 60 tablet 11  . furosemide (LASIX) 40 MG tablet Take 1 tablet (40 mg total) by mouth daily. 30 tablet 0  . guaiFENesin (MUCINEX) 600 MG 12 hr tablet Take 1 tablet (600 mg total) by mouth 2 (two) times daily. 40 tablet 0  . Ipratropium-Albuterol (COMBIVENT RESPIMAT) 20-100 MCG/ACT AERS respimat Inhale 1 puff into the lungs every 6 (six) hours as needed for wheezing or shortness of breath. 1 Inhaler 2  .  metoprolol succinate (TOPROL-XL) 25 MG 24 hr tablet Take 12.5-25 tablets by mouth 2 (two) times daily. Take 1 tablet in the morning and 1/2 tablet in the evening  3  . mirtazapine (REMERON) 15 MG tablet Take 7.5 mg by mouth at bedtime.     . potassium chloride SA (K-DUR,KLOR-CON) 20 MEQ tablet Take 2 tablets (40 mEq total) by mouth daily. 60 tablet 2  . predniSONE (DELTASONE) 10 MG tablet Take 15-20 mg by mouth daily. Alternate between 15 and 20 mgs daily  3   No current facility-administered medications for this visit.    Allergies: No Known Allergies  Social History: The patient  reports that she has never smoked. She does not have any smokeless tobacco history on file. She reports that she does not drink alcohol or use illicit drugs.   Family History: The patient's family history is not on file.   Review of Systems: Please see the history of present illness.   Otherwise, the review of systems is positive for none.   All other systems are reviewed and negative.   Physical Exam: VS:  BP 98/58 mmHg  Pulse 68  Ht 5\' 5"  (1.651 m)  Wt 121 lb (54.885 kg)  BMI 20.14 kg/m2 .  BMI Body mass index is 20.14 kg/(m^2).  Wt Readings from Last 3 Encounters:  11/04/15 121 lb (54.885 kg)  11/02/15 120 lb 3.2 oz (54.522 kg)  10/15/15 114 lb 9.6 oz (51.982 kg)    General: Pleasant. Elderly female who is alert and in no acute distress.  HEENT: Normal. Neck: Supple, no JVD, carotid bruits, or masses noted.  Cardiac: Regular rate and rhythm.  No edema.  Respiratory:  Lungs are clear to auscultation bilaterally with normal work of breathing.  GI: Soft and nontender.  MS: No deformity or atrophy. Gait and ROM intact. Skin: Warm and dry. Color is normal.  Neuro:  Strength and sensation are intact and no gross focal deficits noted.  Psych: Alert, appropriate and with normal affect.   LABORATORY DATA:  EKG:  EKG is not ordered today.  Lab Results  Component Value Date   WBC 12.7* 11/01/2015    HGB 10.1* 11/01/2015   HCT 31.9* 11/01/2015   PLT 293 11/01/2015   GLUCOSE 88 11/02/2015   ALT 19 10/31/2015   AST 22 10/31/2015   NA 144 11/02/2015   K 3.1* 11/02/2015   CL 104 11/02/2015   CREATININE 0.66 11/02/2015   BUN 14 11/02/2015   CO2 33* 11/02/2015   INR 1.91* 01/24/2011    BNP (last 3 results)  Recent Labs  08/25/15 2217 08/29/15 0538 10/31/15 1818  BNP 774.8* 288.6* 907.1*    ProBNP (last 3 results)  No results for input(s): PROBNP in the last 8760 hours.   Other Studies Reviewed Today:  Echo Study Conclusions from 08/2015  - Left ventricle: The cavity size was normal. There was mild  concentric hypertrophy. Systolic function was normal. The  estimated ejection fraction was in the range of 60% to 65%. Wall  motion was normal; there were no regional wall motion  abnormalities. Doppler parameters are consistent with abnormal  left ventricular relaxation (grade 1 diastolic dysfunction).  Doppler parameters are consistent with elevated ventricular  end-diastolic filling pressure. - Aortic valve: Trileaflet; mildly thickened, mildly calcified  leaflets. There was mild stenosis. There was moderate  regurgitation. Mean gradient (S): 8 mm Hg. Peak gradient (S): 17  mm Hg. Valve area (VTI): 2.11 cm^2. Valve area (Vmax): 2.19 cm^2.  Valve area (Vmean): 2.11 cm^2. - Aortic root: The aortic root was normal in size. - Mitral valve: Structurally normal valve. There was moderate  regurgitation. - Left atrium: The atrium was mildly dilated. - Right ventricle: Systolic function was normal. - Right atrium: The atrium was mildly dilated. - Tricuspid valve: There was moderate regurgitation. - Pulmonary arteries: Systolic pressure was moderately increased.  PA peak pressure: 50 mm Hg (S). - Inferior vena cava: The vessel was dilated. The respirophasic  diameter changes were blunted (< 50%), consistent with elevated  central venous pressure. -  Pericardium, extracardiac: There was no pericardial effusion.    Assessment/Plan:  Chronic diastolic CHF  She looks good. Volume status looks fine. Needs repeat lab today. Changing Lasix to just prn weight gain/swelling. Precautions given about travel.   ? HCAP Somewhat confusing. She does not appear to have pneumonia. No fever or chills. No cough. Would just follow for now.   COPD  Paroxysmal atrial fibrillation Managed with rate control and anticoagulation  History of orthostatic hypotension Patient is on Florinef, this is the reason that she was taken off of Lasix on 09/21/2015. Continue Florinef, Lasix changed to just prn. Lab today.   Anemia - needs repeat lab.   Current medicines are reviewed with the patient today.  The patient does not have concerns regarding medicines other than what has been noted above.  The following changes have been made:  See above.  Labs/ tests ordered today include:   No orders of the defined types were placed in this encounter.     Disposition:   FU with Dr. Sallyanne Kuster in 2 months.   Patient is agreeable to this plan and will call if any problems develop in the interim.   Signed: Burtis Junes, RN, ANP-C 11/04/2015 2:55 PM  [place] [address] [place], [place]  [address] Phone: [phone] Fax: [phone]

## 2015-11-04 NOTE — Patient Outreach (Signed)
[facility]) Care Management  11/04/2015  [name] 05-Dec-1930 [id number]  Assessment: 83 year old admitted 5/13-5/15 with healthcare associated pneumonia. Member reports she is feeling better. Member acknowledges that she was admitted with pneumonia. Member denies difficulty breathing at this time. Member with history of heart failure and reports she is weighing herself at home daily. Member reports she has a follow up appointment today with cardiologist today and has an appointment scheduled with primary care next week. RNCM request to schedule a home visit for next member, however member request a phone call for next week.  Plan: continue to follow  [name], RN, MSN, [place] Coordinator Cell: [phone]

## 2015-11-04 NOTE — Patient Instructions (Addendum)
We will be checking the following labs today - BMET, BNP and CBC  Medication Instructions:    Continue with your current medicines. BUT  I am changing the Lasix - to use just as needed for weight gain of 2 pounds or more overnight    Testing/Procedures To Be Arranged:  N/A  Follow-Up:   See Dr. Sallyanne Kuster in 2 months    Other Special Instructions:   Weigh daily  Restrict your salt  Extra careful when traveling!!!    If you need a refill on your cardiac medications before your next appointment, please call your pharmacy.   Call the [place] office at [phone] if you have any questions, problems or concerns.

## 2015-11-04 NOTE — Progress Notes (Signed)
Yes, for the time being that sounds like the right approach. [name]

## 2015-11-05 LAB — BRAIN NATRIURETIC PEPTIDE: Brain Natriuretic Peptide: 525.6 pg/mL — ABNORMAL HIGH (ref <100)

## 2015-11-05 LAB — CULTURE, BLOOD (ROUTINE X 2)
CULTURE: NO GROWTH
Culture: NO GROWTH

## 2015-11-09 ENCOUNTER — Other Ambulatory Visit: Payer: Self-pay

## 2015-11-09 NOTE — Patient Outreach (Signed)
[facility]) Care Management  11/09/2015  [name] 1931/05/19 [id number]  Subjective: "Everything is doing ok".  Assessment: 82 year old with recent admission 5/13-5/15 with healthcare associated pneumonia. Member also has a history of heart failure. RNCM spoke with member who reports she is doing well, member reports she has all her prescribed medications and denies any issues at this time.  Plan: home visit scheduled.  [name], RN, MSN, [place] Coordinator Cell: [phone]

## 2015-11-10 ENCOUNTER — Telehealth: Payer: Self-pay | Admitting: Cardiovascular Disease

## 2015-11-10 MED ORDER — MIDODRINE HCL 2.5 MG PO TABS: ORAL_TABLET | ORAL | Status: DC

## 2015-11-10 NOTE — Telephone Encounter (Signed)
Please advise if any concerns for interaction/potentiating effects for the listed steroid meds.

## 2015-11-10 NOTE — Telephone Encounter (Signed)
Spoke with both both patient and daughter - will have her d/c fludrocortisone.  She reports no dizziness or other orthostatic symptoms. For now I asked that she stop this and not take anything in its place.   Will call in rx for midodrine 2.5 mg to take up to tid for symptomatic hypotension.  Also will see patient next week for BP check.

## 2015-11-10 NOTE — Telephone Encounter (Signed)
New message     Calling to make sure Dr Sallyanne Kuster is aware that pt is taking prednisone 10mg  prescribed by another doctor and also taking fludrocortisone 0.1mg  prescribed by Dr Sallyanne Kuster.  Please call

## 2015-11-12 ENCOUNTER — Other Ambulatory Visit: Payer: Self-pay

## 2015-11-12 ENCOUNTER — Telehealth: Payer: Self-pay | Admitting: Cardiovascular Disease

## 2015-11-12 NOTE — Telephone Encounter (Signed)
Returned call to daughter/DPR, spoke to her regarding the patient's care and issues related to dizziness/symptomatic hypotension. Clarification given on parameters for midodrine use. Explained that this is a BP support medication and works by increasing vascular tone/"squeezing" the blood vessels. Explained since this is an opposing action to BP lowering medications, intended effect is raising pt's BP if she takes it. Since pt BP was 150/80 when checked today, did not advise use of this medication. Regarding symptoms, patient verbalized she has none at present, but daughter explains she is not always good about communicating her symptoms. I advised best course of action is to obtain a BP cuff which would be easy for patient to use and check at home. If BP is low (below [id number] systolic) and patient states any symptoms as previously discussed, would be appropriate to use midodrine. Have discussed BP home cuff selection in detail, including finding appropriate cuff size. Daughter will make purchase at pharmacy. She will contact us if further questions. Pt will follow up as scheduled and will track BPs daily at home until then.   Routed to pharmD for any further recommendations at this time.

## 2015-11-12 NOTE — Telephone Encounter (Signed)
New Message Pt c/o medication issue: 1. Name of Medication: midodrine (PROAMATINE) 2.5 MG tablet   4. What is your medication issue? Per [facility] nurse, Please call the pt daughter Jill Side)  at [phone], regarding perimeters for midodrine (PROAMATINE) 2.5 MG tablet. Daughter is not clear on the certain blood pressures that are required to take this medication.   Today her pressure is 150/80 Heart rate 83. But the daughter is unclear of the perimeters of this medication.  Per [facility] nurse the daughter believed that when the pt is dizzy or c/o of dizziness she was to give this medication to the pt. Please call

## 2015-11-12 NOTE — Patient Outreach (Signed)
[place] [facility]) Care Management  [place]  11/12/2015   AVERLEY WYCOFF 05/01/1931 [id number]  Subjective: member states, "oh I feel fine".  Objective: BP 150/80 mmHg  Pulse 83  Resp 20  Ht 1.651 m (5\' 5" )  Wt 115 lb 9.6 oz (52.436 kg)  BMI 19.24 kg/m2  SpO2 95%, lungs clear, heart rate slightly irregular   Encounter Medications:  Outpatient Encounter Prescriptions as of 11/12/2015  Medication Sig Note  . alendronate (FOSAMAX) 70 MG tablet take 70mg s once weekly   . ALPRAZolam (XANAX) 0.25 MG tablet Take 0.25 mg by mouth at bedtime.    [name] apixaban (ELIQUIS) 2.5 MG TABS tablet Take 1 tablet (2.5 mg total) by mouth 2 (two) times daily.   [name] escitalopram (LEXAPRO) 10 MG tablet take 10 mgs daily   . furosemide (LASIX) 40 MG tablet Take 1 tablet (40 mg total) by mouth daily as needed for fluid or edema (prn weight gain of 2 pounds or more overnight). 11/12/2015: Has been taking one tablet daily  . guaiFENesin (MUCINEX) 600 MG 12 hr tablet Take 1 tablet (600 mg total) by mouth 2 (two) times daily.   . Ipratropium-Albuterol (COMBIVENT RESPIMAT) 20-100 MCG/ACT AERS respimat Inhale 1 puff into the lungs every 6 (six) hours as needed for wheezing or shortness of breath.   . metoprolol succinate (TOPROL-XL) 25 MG 24 hr tablet Take 12.5-25 tablets by mouth 2 (two) times daily. Take 1 tablet in the morning and 1/2 tablet in the evening   . midodrine (PROAMATINE) 2.5 MG tablet Take 1 tablet by mouth up to 3 times daily as needed for low blood pressure.   . mirtazapine (REMERON) 15 MG tablet Take 7.5 mg by mouth at bedtime.    . potassium chloride SA (K-DUR,KLOR-CON) 20 MEQ tablet Take 2 tablets (40 mEq total) by mouth daily. 11/12/2015: Taking one tablet every day.  . predniSONE (DELTASONE) 10 MG tablet Take 15-20 mg by mouth daily. Alternate between 15 and 20 mgs daily 10/31/2015: Pt unsure if it was 15 or 20 mgs today  . fludrocortisone (FLORINEF) 0.1 MG tablet Take 1 tablet  (0.1 mg total) by mouth every morning. (Patient not taking: Reported on 11/12/2015)    No facility-administered encounter medications on file as of 11/12/2015.    Functional Status:  In your present state of health, do you have any difficulty performing the following activities: 10/31/2015 09/17/2015  Hearing? N N  Vision? N N  Difficulty concentrating or making decisions? N Y  Walking or climbing stairs? N N  Dressing or bathing? N N  Doing errands, shopping? N N  Preparing Food and eating ? - N  Using the Toilet? - N  In the past six months, have you accidently leaked urine? - N  Do you have problems with loss of bowel control? - N  Managing your Medications? - N  Managing your Finances? - N  Housekeeping or managing your Housekeeping? - N    Fall/Depression Screening: PHQ 2/9 Scores 09/07/2015 06/16/2015  PHQ - 2 Score 2 0  PHQ- 9 Score 2 -    Assessment: 82 year old with readmission due to pneumonia, member  Also with recent admission for heart failure. Member denies any difficulty breathing, denies any weight gain. Member weighs self daily. Member with some forgetfulness. Ms. Styers daughter assist member as needed with medication management.   Medications reviewed- 1) member has new medication Midodrine-She is unclear on when to take this medication, stating that  her daughter was wondering if she should just take this medication to prevent low blood pressure. Member handed RNCM a piece of paper that she states came from her pharmacy stating to take the Midodrine for blood pressure less than 120/80.  RNCM spoke with member's daughter who requested clarification of when to use Midodrine. RNCM called cardiolgist office and request they call daughter for clarification. Member's blood pressure is 150/80(manual) during home visit. RNCM checked member's blood pressure with her automatic cuff 148/72 heart rate 72. Member denies dizziness. RNCM reviewed signs/symptoms of hypotension.  2)  RNCM also reinforced with member that her furosemide was changed to as needed increase weight gain of 2 pounds or more overnight. Member had forgotten this and told RNCM that she had been taking this medication, however when RNCM looked at [facility] medication box, furosemide was not in the pill box.   Plan: contact next week for transition of care call.  [name], RN, MSN, [place] Coordinator Cell: [phone]

## 2015-11-12 NOTE — Telephone Encounter (Signed)
Agree with recommendations.  

## 2015-11-18 ENCOUNTER — Other Ambulatory Visit: Payer: Self-pay

## 2015-11-18 NOTE — Patient Outreach (Signed)
[place] [facility]) Care Management  11/18/2015  [name] 15-May-1931 [id number]  Call for transition of care. No answer. HIPPA compliant message left.  Plan: follow up call next week.  [name], RN, MSN, [place] Coordinator Cell: [phone]

## 2015-11-19 ENCOUNTER — Ambulatory Visit: Payer: Medicare Other

## 2015-11-23 ENCOUNTER — Other Ambulatory Visit: Payer: Self-pay

## 2015-11-23 ENCOUNTER — Ambulatory Visit (INDEPENDENT_AMBULATORY_CARE_PROVIDER_SITE_OTHER): Payer: Medicare Other | Admitting: Pharmacist

## 2015-11-23 VITALS — BP 110/68 | Wt 120.4 lb

## 2015-11-23 DIAGNOSIS — I951 Orthostatic hypotension: Secondary | ICD-10-CM

## 2015-11-23 NOTE — Progress Notes (Signed)
Patient ID: [name]                 DOB: [birth year]                      MRN: [id number]     HPI: [name] is a 82 y.o. female of Dr. Sallyanne Williamson referred for follow-up for hypotension. She reports she has never had issues with dizziness and denies symptoms of low blood pressure. She states she does not feel dizzy when going from sitting to standing quickly, though daughter states she occasionally sees her steady herself after standing.   She has not taken any of the lasix or midodrine since her last visit. They report they are unsure about the parameters to take midodrine. They also inquire about if she needs the potassium daily (this was held for several days and then labs drawn - which revealed normal potassium levels).   Daughter also reports that she goes on a monthly trip that has put her in the hospital three times now due to dietary indiscretion. Her daughter wonders if she should preemptively take lasix while on her trip to avoid a hospital admission.   Daughter reports that blood pressure today is much higher than the last few checks at the doctor. She reports at last visit in April 100/78. Patient was taking Lasix daily at that time.   Patient also inquiring about a sore on her leg. The sore happened while she was sleeping. She reports that it is a little more red today and has not been healing. Upon evaluation it is blistered and open. Advised that this sore be evaluated by primary care doctor to ensure it is not infected and healing appropriately.   Current HTN meds:  Metoprolol 25mg  each morning and 12.5mg  each evening Furosemide 40mg  PRN 2 lbs weight gain in 1 day or 5 lbs in one week Midodrine 2.5mg  PRN symptomatic low blood pressure  Family History: Father - issues with potassium, died at 26 Mother - had a stroke in early 63s, passed from cancer some years later No siblings  Social History: patient states she is not a current smoker (though daughter rolls her eyes  and shakes her head). They both admit she is frequently around second-hand smoke. She denies smokeless tobacco products and alcohol.   Diet: She drinks caffeine-free soda and decaf coffee, but does admit to occasional tea. She admits her diet consists of mostly fast food.   Exercise: none   Home BP readings: She has a home cuff and reports she monitors once a day. She did not bring her cuff or her log with her today. She states the numbers have been between 110-150 but mostly 120s.   Wt Readings from Last 3 Encounters:  11/12/15 115 lb 9.6 oz (52.436 kg)  11/04/15 121 lb (54.885 kg)  11/02/15 120 lb 3.2 oz (54.522 kg)   BP Readings from Last 3 Encounters:  11/12/15 150/80  11/04/15 98/58  11/02/15 149/58   Pulse Readings from Last 3 Encounters:  11/12/15 83  11/04/15 68  11/02/15 69    Renal function: Estimated Creatinine Clearance: 42.7 mL/min (by C-G formula based on Cr of 0.84).  Past Medical History  Diagnosis Date  . Syncope   . COPD (chronic obstructive pulmonary disease) ([place])   . DM (dermatomyositis)   . Orthostatic hypotension   . Systemic hypertension   . Thoracic kyphosis   . Scoliosis   . Pacemaker 07/13/2013  Her original pacemaker and the current leads were implanted in 1992. She has had 2 generator change out, most recently in 2010. Her device is a Buyer, retail 2110 non RF dual-chamber pacemaker with a battery longevity estimated at about 7 years. The atrial lead is a [employer]. Jude [id number] and the ventricular lead was a Biotronik [id number].   [name] CHB (complete heart block) ([place]) 07/13/2013    Pacemaker dependent  . SSS (sick sinus syndrome) ([place]) 07/13/2013  . Paroxysmal atrial fibrillation ([place]) 09/04/2014  . CHF (congestive heart failure) ([place])     Current Outpatient Prescriptions on File Prior to Visit  Medication Sig Dispense Refill  . alendronate (FOSAMAX) 70 MG tablet take 70mg s once weekly  12  . ALPRAZolam (XANAX) 0.25 MG tablet Take 0.25 mg by mouth at  bedtime.     [name] apixaban (ELIQUIS) 2.5 MG TABS tablet Take 1 tablet (2.5 mg total) by mouth 2 (two) times daily. 60 tablet 1  . escitalopram (LEXAPRO) 10 MG tablet take 10 mgs daily  6  . fludrocortisone (FLORINEF) 0.1 MG tablet Take 1 tablet (0.1 mg total) by mouth every morning. (Patient not taking: Reported on 11/12/2015) 60 tablet 11  . furosemide (LASIX) 40 MG tablet Take 1 tablet (40 mg total) by mouth daily as needed for fluid or edema (prn weight gain of 2 pounds or more overnight). (Patient not taking: Reported on 11/13/2015) 30 tablet 11  . guaiFENesin (MUCINEX) 600 MG 12 hr tablet Take 1 tablet (600 mg total) by mouth 2 (two) times daily. 40 tablet 0  . Ipratropium-Albuterol (COMBIVENT RESPIMAT) 20-100 MCG/ACT AERS respimat Inhale 1 puff into the lungs every 6 (six) hours as needed for wheezing or shortness of breath. 1 Inhaler 2  . metoprolol succinate (TOPROL-XL) 25 MG 24 hr tablet Take 12.5-25 tablets by mouth 2 (two) times daily. Take 1 tablet in the morning and 1/2 tablet in the evening  3  . midodrine (PROAMATINE) 2.5 MG tablet Take 1 tablet by mouth up to 3 times daily as needed for low blood pressure. 90 tablet 1  . mirtazapine (REMERON) 15 MG tablet Take 7.5 mg by mouth at bedtime.     . potassium chloride SA (K-DUR,KLOR-CON) 20 MEQ tablet Take 2 tablets (40 mEq total) by mouth daily. 60 tablet 2  . predniSONE (DELTASONE) 10 MG tablet Take 15-20 mg by mouth daily. Alternate between 15 and 20 mgs daily  3   No current facility-administered medications on file prior to visit.    No Known Allergies   Assessment/Plan: Hypotension: Patient's BP is WNL today and she remains asymptomatic. Instructed her to call the office if her systolic blood pressure is <100 consistently (3 or more days or symptomatic) - use midodrine. Instructed her to take potassium on days that she takes Lasix. I also advised that it would be ok to take 1/2 tablet of Lasix daily preemptively while on her trip.  Advised that she should call if she experienced symptoms of low blood pressure while taking Lasix daily.  Will get labs at next appointment to check potassium again after only taking with Lasix. She already has follow-up scheduled with Dr. Loletha Grayer in 1 month. Will see her 1 month after this to evaluate home blood pressure readings.   Thank you, Lelan Pons. Patterson Hammersmith, [facility]  11/23/2015 3:30 PM

## 2015-11-23 NOTE — Patient Outreach (Signed)
[facility]) Care Management  11/23/2015  [name] 1931/01/10 [id number]  RNCM called for transition of care. Member reports she is on her way to the doctor.  Plan: RNCM will follow up by the end of the week.  [name], RN, MSN, [place] Coordinator Cell: [phone]

## 2015-11-23 NOTE — Patient Instructions (Signed)
Return for a a follow up appointment in 1 month  Your blood pressure today is 122/74  Check your blood pressure at home daily (if able) and keep record of the readings.  Take your BP meds as follows: Take Lasix 1/2 tablet daily while on your trip Only take potassium tablet when take Lasix tablet Continue to monitor blood pressure at home - if consistently below 100 (top number) call office at [phone].   Bring all of your meds, your BP cuff and your record of home blood pressures to your next appointment.  Exercise as you're able, try to walk approximately 30 minutes per day.  Keep salt intake to a minimum, especially watch canned and prepared boxed foods.  Eat more fresh fruits and vegetables and fewer canned items.  Avoid eating in fast food restaurants.    HOW TO TAKE YOUR BLOOD PRESSURE: . Rest 5 minutes before taking your blood pressure. .  Don't smoke or drink caffeinated beverages for at least 30 minutes before. . Take your blood pressure before (not after) you eat. . Sit comfortably with your back supported and both feet on the floor (don't cross your legs). . Elevate your arm to heart level on a table or a desk. . Use the proper sized cuff. It should fit smoothly and snugly around your bare upper arm. There should be enough room to slip a fingertip under the cuff. The bottom edge of the cuff should be 1 inch above the crease of the elbow. . Ideally, take 3 measurements at one sitting and record the average.

## 2015-11-23 NOTE — Assessment & Plan Note (Signed)
Patient's BP is WNL today and she remains asymptomatic. Instructed her to call the office if her systolic blood pressure is <100 consistently (3 or more days or symptomatic) - will use midodrine. Instructed her to take potassium on days that she takes Lasix. I also advised that it would be ok to take 1/2 tablet of Lasix daily preemptively while on her trip. Advised that she should call if she experienced symptoms of low blood pressure while taking Lasix daily. Will get labs at next appointment to check potassium again after only taking with Lasix. She already has follow-up scheduled with Dr. Loletha Grayer in 1 month. Will see her 1 month after this to evaluate home blood pressure readings.

## 2015-11-25 DIAGNOSIS — I951 Orthostatic hypotension: Secondary | ICD-10-CM | POA: Diagnosis not present

## 2015-11-25 DIAGNOSIS — I5032 Chronic diastolic (congestive) heart failure: Secondary | ICD-10-CM | POA: Diagnosis not present

## 2015-11-25 DIAGNOSIS — L03115 Cellulitis of right lower limb: Secondary | ICD-10-CM | POA: Diagnosis not present

## 2015-11-25 DIAGNOSIS — S81811A Laceration without foreign body, right lower leg, initial encounter: Secondary | ICD-10-CM | POA: Diagnosis not present

## 2015-11-26 ENCOUNTER — Other Ambulatory Visit: Payer: Self-pay

## 2015-11-26 NOTE — Patient Outreach (Signed)
[place] [facility]) Care Management  11/26/2015  [name] [birth year] [id number]  RNCM called for transition of care. No answer. HIPPA compliant message left.  Plan: transition of care call next week.  [name], RN, MSN, [place] Coordinator Cell: [phone]

## 2015-12-02 DIAGNOSIS — T148 Other injury of unspecified body region: Secondary | ICD-10-CM | POA: Diagnosis not present

## 2015-12-02 DIAGNOSIS — L03115 Cellulitis of right lower limb: Secondary | ICD-10-CM | POA: Diagnosis not present

## 2015-12-02 DIAGNOSIS — I5032 Chronic diastolic (congestive) heart failure: Secondary | ICD-10-CM | POA: Diagnosis not present

## 2015-12-02 DIAGNOSIS — I951 Orthostatic hypotension: Secondary | ICD-10-CM | POA: Diagnosis not present

## 2015-12-03 ENCOUNTER — Other Ambulatory Visit: Payer: Self-pay

## 2015-12-03 NOTE — Patient Outreach (Signed)
[facility]) Care Management  12/03/2015  [name] 1930/12/30 [id number]  Subjective: "Everything seems to be going along all right."  Assessment: 82 year old with admission to hospital 5/13-5/15 with pneumonia. RNCM called for transition of crae call. Member with no concerns or issues. Reports she saw primary care on yesterday and she is doing well. Member reports, she had an area to her leg like a scratch that is better following a round of antibiotics. Denies any concerns at this time.  Plan: transition of care call next week.  [name], RN, MSN, [place] Coordinator Cell: [phone]

## 2015-12-07 DIAGNOSIS — M546 Pain in thoracic spine: Secondary | ICD-10-CM | POA: Diagnosis not present

## 2015-12-09 ENCOUNTER — Other Ambulatory Visit: Payer: Self-pay | Admitting: Geriatric Medicine

## 2015-12-09 ENCOUNTER — Ambulatory Visit
Admission: RE | Admit: 2015-12-09 | Discharge: 2015-12-09 | Disposition: A | Discharge status: Home / Self Care | Payer: Medicare Other | Source: Ambulatory Visit | Attending: Geriatric Medicine | Admitting: Geriatric Medicine

## 2015-12-09 DIAGNOSIS — M546 Pain in thoracic spine: Secondary | ICD-10-CM

## 2015-12-09 DIAGNOSIS — M47814 Spondylosis without myelopathy or radiculopathy, thoracic region: Secondary | ICD-10-CM | POA: Diagnosis not present

## 2015-12-10 ENCOUNTER — Other Ambulatory Visit: Payer: Self-pay

## 2015-12-10 NOTE — Patient Outreach (Signed)
[place] [facility]) Care Management  12/10/2015  DANNELY HENDERSON 1931/05/24 [id number]  Subjectve: "nope, I'm not having any of those problems."  Assessment: 82 year old with recent admission for pneumonia. Member denies any shortness of breath, no lung issues at this time. Denies issues with heart failure exacerbation. Transition of care program ended. RNCM discussed transition to health coach. Member declines.  RNCM encouraged member to call as needed. RNCM reinforced availability of 24 hour nurse advice line as needed.  Plan: close case.  [name], RN, MSN, [place] Coordinator Cell: [phone]

## 2015-12-14 ENCOUNTER — Other Ambulatory Visit: Payer: Self-pay | Admitting: *Deleted

## 2015-12-14 MED ORDER — APIXABAN 2.5 MG PO TABS: 2.5000 mg | ORAL_TABLET | Freq: Two times a day (BID) | ORAL | Status: DC

## 2015-12-23 ENCOUNTER — Ambulatory Visit (INDEPENDENT_AMBULATORY_CARE_PROVIDER_SITE_OTHER): Payer: Medicare Other | Admitting: *Deleted

## 2015-12-23 ENCOUNTER — Telehealth: Payer: Self-pay | Admitting: Cardiology

## 2015-12-23 DIAGNOSIS — I495 Sick sinus syndrome: Secondary | ICD-10-CM

## 2015-12-23 NOTE — Telephone Encounter (Signed)
Attempted to confirm remote transmission with pt. No answer and was unable to leave a message.   

## 2015-12-25 ENCOUNTER — Encounter: Payer: Self-pay | Admitting: Cardiology

## 2015-12-28 NOTE — Progress Notes (Signed)
Remote pacemaker transmission.   

## 2015-12-28 NOTE — Addendum Note (Signed)
Addended by: Tiajuana Amass on: 12/28/2015 04:28 PM   Modules accepted: Level of Service

## 2015-12-29 LAB — CUP PACEART REMOTE DEVICE CHECK
Battery Remaining Longevity: 38 mo
Battery Remaining Percentage: 35 %
Brady Statistic AP VP Percent: 90 %
Brady Statistic AP VS Percent: 1 %
Brady Statistic AS VS Percent: 1 %
Implantable Lead Implant Date: 19920213
Implantable Lead Location: 753860
Lead Channel Impedance Value: 360 Ohm
Lead Channel Impedance Value: 450 Ohm
Lead Channel Pacing Threshold Amplitude: 0.625 V
Lead Channel Pacing Threshold Pulse Width: 0.4 ms
Lead Channel Sensing Intrinsic Amplitude: 2.1 mV
Lead Channel Setting Pacing Amplitude: 0.875
Lead Channel Setting Pacing Pulse Width: 0.4 ms
MDC IDC LEAD IMPLANT DT: 19920213
MDC IDC LEAD LOCATION: 753859
MDC IDC LEAD SERIAL: 23000302
MDC IDC MSMT BATTERY VOLTAGE: 2.83 V
MDC IDC MSMT LEADCHNL RA PACING THRESHOLD AMPLITUDE: 0.75 V
MDC IDC MSMT LEADCHNL RA PACING THRESHOLD PULSEWIDTH: 0.4 ms
MDC IDC MSMT LEADCHNL RV SENSING INTR AMPL: 12 mV
MDC IDC SESS DTM: 20170706204555
MDC IDC SET LEADCHNL RA PACING AMPLITUDE: 2.25 V
MDC IDC SET LEADCHNL RV SENSING SENSITIVITY: 4 mV
MDC IDC STAT BRADY AS VP PERCENT: 9.9 %
MDC IDC STAT BRADY RA PERCENT PACED: 86 %
MDC IDC STAT BRADY RV PERCENT PACED: 99 %
Pulse Gen Model: 2110
Pulse Gen Serial Number: 2314326

## 2015-12-30 ENCOUNTER — Encounter: Payer: Self-pay | Admitting: Cardiology

## 2016-01-01 ENCOUNTER — Telehealth: Payer: Self-pay | Admitting: Pharmacist Clinician (PhC)/ Clinical Pharmacy Specialist

## 2016-01-01 NOTE — Telephone Encounter (Signed)
Daughter called and LM about concerns over midodrine dosing for her mother.  Returned call.  Daughter states that shortly after we saw her last she went to PCP.  At his office her BP was 58 systolic.  MD told her to take the midodrine tid not prn.  Daughter notes that whenever she goes to PCP her BP is significantly lower than anywhere else.  Since increasing dose to daily patient has noted her scalp itches and her hair is falling out.  States it is quite noticeable and she is not happy about it.  Daughter states home BP readings since then have been mostly [id number] systolic and patient rarely feels hypotensive symptoms.     Suggested that they go back to taking midodrine only on PRN systolic BP < [id number].  She has an appointment with Dr. Loletha Grayer next Thursday.  Daughter will have her continue to check home BP readings and bring that information when she sees Dr. Loletha Grayer next week.  Advised that it may take a week or so for the hair loss to ease.

## 2016-01-07 ENCOUNTER — Encounter: Payer: Self-pay | Admitting: Cardiovascular Disease

## 2016-01-07 ENCOUNTER — Ambulatory Visit (INDEPENDENT_AMBULATORY_CARE_PROVIDER_SITE_OTHER): Payer: Medicare Other | Admitting: Cardiovascular Disease

## 2016-01-07 VITALS — BP 112/68 | HR 73 | Ht 65.0 in | Wt 121.8 lb

## 2016-01-07 DIAGNOSIS — Z7901 Long term (current) use of anticoagulants: Secondary | ICD-10-CM

## 2016-01-07 DIAGNOSIS — I495 Sick sinus syndrome: Secondary | ICD-10-CM | POA: Diagnosis not present

## 2016-01-07 DIAGNOSIS — I5032 Chronic diastolic (congestive) heart failure: Secondary | ICD-10-CM

## 2016-01-07 DIAGNOSIS — I442 Atrioventricular block, complete: Secondary | ICD-10-CM

## 2016-01-07 DIAGNOSIS — I480 Paroxysmal atrial fibrillation: Secondary | ICD-10-CM

## 2016-01-07 DIAGNOSIS — I10 Essential (primary) hypertension: Secondary | ICD-10-CM

## 2016-01-07 DIAGNOSIS — I951 Orthostatic hypotension: Secondary | ICD-10-CM

## 2016-01-07 DIAGNOSIS — Z950 Presence of cardiac pacemaker: Secondary | ICD-10-CM

## 2016-01-07 NOTE — Progress Notes (Signed)
Patient ID: [name], female   DOB: Sep 24, 1930, 82 y.o.   MRN: [id number]    Cardiology Office Note    Date:  01/07/2016   ID:  SHELBYLYNN ROMBERG, DOB 06/19/1931, MRN [id number]  PCP:  Mathews Argyle, MD  Cardiologist:  Sanda Klein, MD   Chief Complaint  Patient presents with  . Follow-up    patient reports easy bruising and hair loss. talked with kristin last week regarding changing medications due to the hair loss.    History of Present Illness:  [name] is a 82 y.o. female with complete heart block, sinus node dysfunction and pacemaker documentation of asymptomatic paroxysmal atrial fibrillation, diastolic heart failure and mild aortic inufficiency.  She has also been taking prednisone for polymyalgia rheumatica, Currently may be experiencing a repeat flare. She went to [facility] and felt well, without problems with dyspnea or dizziness.  She is taking Eliquis and has not had any bleeding problems or neurological events. He is able to lie completely supine and walk fairly briskly without complaints of shortness of breath. She denies angina, edema, syncope, palpitations, intermittent claudication  Interrogation of her pacemaker on remote download July 6 shows a very low burden of atrial fibrillation (less than 1%), compared to prior burden in December 2016, when she was hospitalized with pneumonia. Otherwise,  she has 86% atrial pacing and virtually 100% ventricular pacing. There are no detectable R waves. She has complete heart block and is pacemaker dependent. Battery longevity is around 3.4-3.9 years.   She has had varied estimation of the severity of her aortic insufficiency by echocardiography but has a normal size left ventricle with normal left ventricular function and no other significant valvular abnormalities. Most recent echocardiogram showed only mild aortic insufficiency.   Her original pacemaker and the current leads were implanted in 1992. She has had 2  generator change out procedures, most recently in 2010. Her device is a Buyer, retail 2110 non RF dual-chamber pacemaker. The atrial lead is a St. Jude [id number] and the ventricular lead was a Biotronik [id number].    Past Medical History  Diagnosis Date  . Syncope   . COPD (chronic obstructive pulmonary disease) ([place])   . DM (dermatomyositis)   . Orthostatic hypotension   . Systemic hypertension   . Thoracic kyphosis   . Scoliosis   . Pacemaker 07/13/2013    Her original pacemaker and the current leads were implanted in 1992. She has had 2 generator change out, most recently in 2010. Her device is a Buyer, retail 2110 non RF dual-chamber pacemaker with a battery longevity estimated at about 7 years. The atrial lead is a St. Jude [id number] and the ventricular lead was a Biotronik [id number].   [name] CHB (complete heart block) ([place]) 07/13/2013    Pacemaker dependent  . SSS (sick sinus syndrome) ([place]) 07/13/2013  . Paroxysmal atrial fibrillation ([place]) 09/04/2014  . CHF (congestive heart failure) [facility])     Past Surgical History  Procedure Laterality Date  . Permanent pacemaker generator change  03/27/2009    St.Jude  . Nm myocar perf wall motion  09/13/2011    Low risk  . US echocardiography  03/28/2012    Mod LAE,mild MR,aortic sclerosis w/mod AI,mod. TR,mild PI,Stage I diastolic dysfunction    Current Medications: Outpatient Prescriptions Prior to Visit  Medication Sig Dispense Refill  . alendronate (FOSAMAX) 70 MG tablet take 70mg s once weekly  12  . ALPRAZolam (XANAX) 0.25 MG tablet Take 0.25 mg by  mouth at bedtime.     [name] apixaban (ELIQUIS) 2.5 MG TABS tablet Take 1 tablet (2.5 mg total) by mouth 2 (two) times daily. 180 tablet 2  . escitalopram (LEXAPRO) 10 MG tablet take 10 mgs daily  6  . furosemide (LASIX) 40 MG tablet Take 1 tablet (40 mg total) by mouth daily as needed for fluid or edema (prn weight gain of 2 pounds or more overnight). 30 tablet 11  . Ipratropium-Albuterol (COMBIVENT RESPIMAT)  20-100 MCG/ACT AERS respimat Inhale 1 puff into the lungs every 6 (six) hours as needed for wheezing or shortness of breath. 1 Inhaler 2  . metoprolol succinate (TOPROL-XL) 25 MG 24 hr tablet Take 12.5-25 tablets by mouth 2 (two) times daily. Take 1 tablet in the morning and 1/2 tablet in the evening  3  . mirtazapine (REMERON) 15 MG tablet Take 7.5 mg by mouth at bedtime.     . predniSONE (DELTASONE) 10 MG tablet Take 15-20 mg by mouth daily. Alternate between 15 and 20 mgs daily  3  . guaiFENesin (MUCINEX) 600 MG 12 hr tablet Take 1 tablet (600 mg total) by mouth 2 (two) times daily. (Patient not taking: Reported on 11/23/2015) 40 tablet 0  . midodrine (PROAMATINE) 2.5 MG tablet Take 1 tablet by mouth up to 3 times daily as needed for low blood pressure. (Patient taking differently: Take 2.5 mg by mouth as needed (for systolic blood pressure lower than 100 mmHG). Take 1 tablet by mouth up to 3 times daily as needed for low blood pressure.) 90 tablet 1  . potassium chloride SA (K-DUR,KLOR-CON) 20 MEQ tablet Take 2 tablets (40 mEq total) by mouth daily. (Patient not taking: Reported on 12/03/2015) 60 tablet 2   No facility-administered medications prior to visit.     Allergies:   Review of patient's allergies indicates no known allergies.   Social History   Social History  . Marital Status: Married    Spouse Name: N/A  . Number of Children: N/A  . Years of Education: N/A   Social History Main Topics  . Smoking status: Never Smoker   . Smokeless tobacco: None  . Alcohol Use: No  . Drug Use: No  . Sexual Activity: Not Asked   Other Topics Concern  . None   Social History Narrative       ROS:   Please see the history of present illness.    ROS All other systems reviewed and are negative.   PHYSICAL EXAM:   VS:  BP 112/68 mmHg  Pulse 73  Ht 5\' 5"  (1.651 m)  Wt 55.248 kg (121 lb 12.8 oz)  BMI 20.27 kg/m2   GEN: Well nourished, well developed, in no acute distress.  He is very  slender HEENT: normal Neck: no JVD, carotid bruits, or masses Cardiac: Paradoxically split second heart sound, RRR; no murmurs, rubs, or gallops,no edema; healthy subclavian pacemaker site  Respiratory:  Possibly a faint pleural rub in the left posterior base, otherwise clear to auscultation bilaterally, normal work of breathing; no wheezes or rhonchi heard GI: soft, nontender, nondistended, + BS MS: no deformity or atrophy Skin: warm and dry, no rash Neuro:  Alert and Oriented x 3, Strength and sensation are intact Psych: euthymic mood, full affect  Wt Readings from Last 3 Encounters:  01/07/16 55.248 kg (121 lb 12.8 oz)  11/23/15 54.613 kg (120 lb 6.4 oz)  11/12/15 52.436 kg (115 lb 9.6 oz)      Studies/Labs Reviewed:  EKG:  EKG is not ordered today.  The intracardiac electrogram ordered today demonstrates 100% AV sequential pacing  Recent Labs: 10/31/2015: ALT 19 11/02/2015: Magnesium 1.9 11/04/2015: Brain Natriuretic Peptide 525.6*; BUN 26*; Creat 0.84; Hemoglobin 11.0*; Platelets 418*; Potassium 4.9; Sodium 141       ASSESSMENT:    1. Chronic diastolic congestive heart failure ([place])   2. Orthostatic hypotension   3. SSS (sick sinus syndrome) ([place])   4. CHB (complete heart block) ([facility])   5. Paroxysmal atrial fibrillation ([place])   6. Pacemaker   7. Anticoagulant long-term use   8. Benign essential HTN      PLAN:  In order of problems listed above:  1. CHF:On today's exam she appears to be clinically euvolemic, NYHA functional class I-II. She is taking diuretics on an "as needed basis" only now. 2. Orthostatic hypotension: Really not taking Primatene and seems to be well compensated Prefer to try more conservative methods such as slow changes in position, compression stockings, adequate hydration. If she continues to have symptomatic hypotension, ProAmatine during daytime only would be a better choice than fludrocortisone, in order to avoid heart failure. Cannot exclude  that she may have a component of adrenal insufficiency, but her treatment with prednisone has been relatively recent and in relatively low doses. 3. SSS: Normal pacemaker function, appropriate heart rate histogram distribution,  4. CHB: She is pacemaker dependent 5. PAF: Her episodes of exacerbation were clearly related to her pulmonary infection and the arrhythmia seems to have settled down again. Rate control remained adequate even when she was ill on current low dose of beta blocker. 6. [id number] remote downloads every 3 months and office visit yearly 7. Anticoagulation: She should continue anticoagulation since her embolic risk is high. CHADSVasc 5 (age 89, gender, heart failure, hypertension). No serious bleeding complications to date and no falls recently. 8. HTN: Good blood pressure today.    Medication Adjustments/Labs and Tests Ordered: Current medicines are reviewed at length with the patient today.  Concerns regarding medicines are outlined above.  Medication changes, Labs and Tests ordered today are listed in the Patient Instructions below. Patient Instructions  Dr Sallyanne Kuster recommends that you continue on your current medications as directed. Please refer to the Current Medication list given to you today.  Remote monitoring is used to monitor your Pacemaker of ICD from home. This monitoring reduces the number of office visits required to check your device to one time per year. It allows [place] to keep an eye on the functioning of your device to ensure it is working properly. You are scheduled for a device check from home on Thursday, October 19th, 2017. You may send your transmission at any time that day. If you have a wireless device, the transmission will be sent automatically. After your physician reviews your transmission, you will receive a postcard with your next transmission date.  Dr Sallyanne Kuster recommends that you schedule a follow-up appointment in 6 months with a device check. You  will receive a reminder letter in the mail two months in advance. If you don't receive a letter, please call our office to schedule the follow-up appointment.  If you need a refill on your cardiac medications before your next appointment, please call your pharmacy.     Signed, Sanda Klein, MD  01/07/2016 12:24 PM    [address], [place], [place]  [address] Phone: [phone]; Fax: [phone]

## 2016-01-07 NOTE — Patient Instructions (Signed)
Dr Sallyanne Kuster recommends that you continue on your current medications as directed. Please refer to the Current Medication list given to you today.  Remote monitoring is used to monitor your Pacemaker of ICD from home. This monitoring reduces the number of office visits required to check your device to one time per year. It allows [place] to keep an eye on the functioning of your device to ensure it is working properly. You are scheduled for a device check from home on Thursday, October 19th, 2017. You may send your transmission at any time that day. If you have a wireless device, the transmission will be sent automatically. After your physician reviews your transmission, you will receive a postcard with your next transmission date.  Dr Sallyanne Kuster recommends that you schedule a follow-up appointment in 6 months with a device check. You will receive a reminder letter in the mail two months in advance. If you don't receive a letter, please call our office to schedule the follow-up appointment.  If you need a refill on your cardiac medications before your next appointment, please call your pharmacy.

## 2016-02-04 ENCOUNTER — Ambulatory Visit (INDEPENDENT_AMBULATORY_CARE_PROVIDER_SITE_OTHER): Payer: Medicare Other | Admitting: Pharmacist

## 2016-02-04 VITALS — BP 108/62 | HR 73 | Wt 122.6 lb

## 2016-02-04 DIAGNOSIS — I10 Essential (primary) hypertension: Secondary | ICD-10-CM

## 2016-02-04 DIAGNOSIS — I951 Orthostatic hypotension: Secondary | ICD-10-CM | POA: Diagnosis not present

## 2016-02-04 NOTE — Patient Instructions (Signed)
Continue to monitor your pressure at home. Call the office if you have any issues.   Follow up with Dr. Loletha Grayer as scheduled.

## 2016-02-04 NOTE — Progress Notes (Signed)
Patient ID: [name]                 DOB: [birth year]                      MRN: [id number]     HPI: [name] is a 82 y.o. female patient of Dr. Sallyanne Kuster with PMH below who presents today for blood pressure follow up with her daughter who helps care for her. She reports she has never had issues with dizziness and denies symptoms of low blood pressure. She states she does not feel dizzy when going from sitting to standing quickly.   Since our last visit she has been on her monthly trip twice (which daughter previously reports always puts her in the hospital due to fluid overload). She preemptively took 20mg  of Lasix while on her trip and states that does seem to help. She required 40mg x3 days of Lasix after her most recent trip but was not admitted to the hospital after either trip.   She also was seen by primary care at which time her blood pressure was low and she was told to take midodrine TID rather than PRN. She reports that her hair started falling out due to the medication. She and her daughter called our office since Dr. Loletha Grayer prescribed the medication. She went back off the medication as she has always been asymptomatic and she states her hair has stopped falling out but has not come back in from falling out.   She also reports that she has been weaning off prednisone taking 15mg  alternating with 20mg , but her back and leg back has been increasing with lower doses.    Cardiac Hx: CHF, HTN, orthostatic hypotension, PAF, COPD  Current HTN meds:  Metoprolol 25mg  each morning and 12.5mg  each evening Furosemide 40mg  PRN 2 lbs weight gain in 1 day or 5 lbs in one week Midodrine 2.5mg  PRN symptomatic low blood pressure  Family History: Father - issues with potassium, died at 30 Mother - had a stroke in early 47s, passed from cancer some years later No siblings  Social History: patient states she is not a current smoker (though daughter rolls her eyes and shakes her head). They both  admit she is frequently around second-hand smoke. She denies smokeless tobacco products and alcohol.   Diet: She drinks caffeine-free soda and decaf coffee, but does admit to occasional tea. She admits her diet consists of mostly fast food.   Exercise: none   Home BP readings: home pressures have been normal when monitoring - though she rarely records the date or time.   Wt Readings from Last 3 Encounters:  01/07/16 121 lb 12.8 oz (55.2 kg)  11/23/15 120 lb 6.4 oz (54.6 kg)  11/12/15 115 lb 9.6 oz (52.4 kg)   BP Readings from Last 3 Encounters:  01/07/16 112/68  11/23/15 110/68  11/12/15 (!) 150/80   Pulse Readings from Last 3 Encounters:  01/07/16 73  11/12/15 83  11/04/15 68    Renal function: CrCl cannot be calculated (Unknown ideal weight.).  Past Medical History:  Diagnosis Date  . CHB (complete heart block) ([place]) 07/13/2013   Pacemaker dependent  . CHF (congestive heart failure) ([place])   . COPD (chronic obstructive pulmonary disease) ([place])   . DM (dermatomyositis)   . Orthostatic hypotension   . Pacemaker 07/13/2013   Her original pacemaker and the current leads were implanted in 1992. She has had 2 generator change out,  most recently in 2010. Her device is a Buyer, retail 2110 non RF dual-chamber pacemaker with a battery longevity estimated at about 7 years. The atrial lead is a St. Jude [id number] and the ventricular lead was a Biotronik [id number].   [name] Paroxysmal atrial fibrillation ([place]) 09/04/2014  . Scoliosis   . SSS (sick sinus syndrome) ([place]) 07/13/2013  . Syncope   . Systemic hypertension   . Thoracic kyphosis     Current Outpatient Prescriptions on File Prior to Visit  Medication Sig Dispense Refill  . alendronate (FOSAMAX) 70 MG tablet take 70mg s once weekly  12  . ALPRAZolam (XANAX) 0.25 MG tablet Take 0.25 mg by mouth at bedtime.     [name] apixaban (ELIQUIS) 2.5 MG TABS tablet Take 1 tablet (2.5 mg total) by mouth 2 (two) times daily. 180 tablet 2  . escitalopram  (LEXAPRO) 10 MG tablet take 10 mgs daily  6  . furosemide (LASIX) 40 MG tablet Take 1 tablet (40 mg total) by mouth daily as needed for fluid or edema (prn weight gain of 2 pounds or more overnight). 30 tablet 11  . Ipratropium-Albuterol (COMBIVENT RESPIMAT) 20-100 MCG/ACT AERS respimat Inhale 1 puff into the lungs every 6 (six) hours as needed for wheezing or shortness of breath. 1 Inhaler 2  . metoprolol succinate (TOPROL-XL) 25 MG 24 hr tablet Take 12.5-25 tablets by mouth 2 (two) times daily. Take 1 tablet in the morning and 1/2 tablet in the evening  3  . midodrine (PROAMATINE) 2.5 MG tablet Take 1 tablet by mouth as needed. for systolic blood pressure lower than 100 mmHG    . mirtazapine (REMERON) 15 MG tablet Take 7.5 mg by mouth at bedtime.     . potassium chloride SA (K-DUR,KLOR-CON) 20 MEQ tablet Take 1 tablet by mouth daily as needed. For fluid or weight gain    . predniSONE (DELTASONE) 10 MG tablet Take 15-20 mg by mouth daily. Alternate between 15 and 20 mgs daily  3   No current facility-administered medications on file prior to visit.     No Known Allergies  There were no vitals taken for this visit.   Assessment/Plan: Orthostatic Hypotension: BP today is at goal. No symptoms of orthostatic hypotension.  She will remain off midodrine unless she experiences symptoms. She will continue to take lasix 20mg  preemptively on her trips to help reduce volume overload. She will follow up with Dr. Loletha Grayer as instructed and blood pressure clinic as needed.    Thank you, Lelan Pons. Patterson Hammersmith, [facility]  02/04/2016 9:07 AM

## 2016-02-09 ENCOUNTER — Encounter: Payer: Self-pay | Admitting: Pharmacist

## 2016-03-01 DIAGNOSIS — C44729 Squamous cell carcinoma of skin of left lower limb, including hip: Secondary | ICD-10-CM | POA: Diagnosis not present

## 2016-03-01 DIAGNOSIS — L858 Other specified epidermal thickening: Secondary | ICD-10-CM | POA: Diagnosis not present

## 2016-03-01 DIAGNOSIS — D0461 Carcinoma in situ of skin of right upper limb, including shoulder: Secondary | ICD-10-CM | POA: Diagnosis not present

## 2016-03-17 DIAGNOSIS — M353 Polymyalgia rheumatica: Secondary | ICD-10-CM | POA: Diagnosis not present

## 2016-03-17 DIAGNOSIS — M79604 Pain in right leg: Secondary | ICD-10-CM | POA: Diagnosis not present

## 2016-03-17 DIAGNOSIS — Z23 Encounter for immunization: Secondary | ICD-10-CM | POA: Diagnosis not present

## 2016-03-30 DIAGNOSIS — M353 Polymyalgia rheumatica: Secondary | ICD-10-CM | POA: Diagnosis not present

## 2016-03-30 DIAGNOSIS — J301 Allergic rhinitis due to pollen: Secondary | ICD-10-CM | POA: Diagnosis not present

## 2016-03-30 DIAGNOSIS — I10 Essential (primary) hypertension: Secondary | ICD-10-CM | POA: Diagnosis not present

## 2016-03-30 DIAGNOSIS — I951 Orthostatic hypotension: Secondary | ICD-10-CM | POA: Diagnosis not present

## 2016-04-07 ENCOUNTER — Telehealth: Payer: Self-pay | Admitting: Cardiology

## 2016-04-07 ENCOUNTER — Ambulatory Visit (INDEPENDENT_AMBULATORY_CARE_PROVIDER_SITE_OTHER): Payer: Medicare Other | Admitting: *Deleted

## 2016-04-07 DIAGNOSIS — I442 Atrioventricular block, complete: Secondary | ICD-10-CM

## 2016-04-07 NOTE — Telephone Encounter (Signed)
LMOVM reminding pt to send remote transmission.   

## 2016-04-08 ENCOUNTER — Encounter: Payer: Self-pay | Admitting: Cardiology

## 2016-04-08 NOTE — Progress Notes (Signed)
Remote pacemaker transmission.   

## 2016-05-05 DIAGNOSIS — M25472 Effusion, left ankle: Secondary | ICD-10-CM | POA: Diagnosis not present

## 2016-05-05 DIAGNOSIS — J449 Chronic obstructive pulmonary disease, unspecified: Secondary | ICD-10-CM | POA: Diagnosis not present

## 2016-05-05 LAB — CUP PACEART REMOTE DEVICE CHECK
Battery Remaining Longevity: 28 mo
Brady Statistic AP VS Percent: 1 %
Brady Statistic AS VP Percent: 9.4 %
Brady Statistic RV Percent Paced: 99 %
Implantable Lead Implant Date: 19920213
Implantable Lead Location: 753860
Implantable Lead Serial Number: 23000302
Lead Channel Impedance Value: 390 Ohm
Lead Channel Pacing Threshold Amplitude: 0.5 V
Lead Channel Sensing Intrinsic Amplitude: 12 mV
Lead Channel Setting Pacing Amplitude: 0.75 V
Lead Channel Setting Pacing Amplitude: 2.25 V
Lead Channel Setting Pacing Pulse Width: 0.4 ms
MDC IDC LEAD IMPLANT DT: 19920213
MDC IDC LEAD LOCATION: 753859
MDC IDC MSMT BATTERY REMAINING PERCENTAGE: 25 %
MDC IDC MSMT BATTERY VOLTAGE: 2.8 V
MDC IDC MSMT LEADCHNL RA PACING THRESHOLD AMPLITUDE: 0.75 V
MDC IDC MSMT LEADCHNL RA PACING THRESHOLD PULSEWIDTH: 0.4 ms
MDC IDC MSMT LEADCHNL RA SENSING INTR AMPL: 2.6 mV
MDC IDC MSMT LEADCHNL RV IMPEDANCE VALUE: 400 Ohm
MDC IDC MSMT LEADCHNL RV PACING THRESHOLD PULSEWIDTH: 0.4 ms
MDC IDC PG IMPLANT DT: 20101008
MDC IDC PG SERIAL: 2314326
MDC IDC SESS DTM: 20171019231430
MDC IDC SET LEADCHNL RV SENSING SENSITIVITY: 4 mV
MDC IDC STAT BRADY AP VP PERCENT: 91 %
MDC IDC STAT BRADY AS VS PERCENT: 1 %
MDC IDC STAT BRADY RA PERCENT PACED: 87 %

## 2016-05-10 DIAGNOSIS — G3184 Mild cognitive impairment, so stated: Secondary | ICD-10-CM | POA: Diagnosis not present

## 2016-05-10 DIAGNOSIS — I10 Essential (primary) hypertension: Secondary | ICD-10-CM | POA: Diagnosis not present

## 2016-05-17 DIAGNOSIS — R05 Cough: Secondary | ICD-10-CM | POA: Diagnosis not present

## 2016-05-17 DIAGNOSIS — M546 Pain in thoracic spine: Secondary | ICD-10-CM | POA: Diagnosis not present

## 2016-05-17 DIAGNOSIS — J209 Acute bronchitis, unspecified: Secondary | ICD-10-CM | POA: Diagnosis not present

## 2016-05-31 DIAGNOSIS — J069 Acute upper respiratory infection, unspecified: Secondary | ICD-10-CM | POA: Diagnosis not present

## 2016-05-31 DIAGNOSIS — S81811A Laceration without foreign body, right lower leg, initial encounter: Secondary | ICD-10-CM | POA: Diagnosis not present

## 2016-07-13 ENCOUNTER — Encounter: Payer: Medicare Other | Admitting: Cardiovascular Disease

## 2016-07-13 DIAGNOSIS — I5030 Unspecified diastolic (congestive) heart failure: Secondary | ICD-10-CM | POA: Diagnosis not present

## 2016-07-13 DIAGNOSIS — J449 Chronic obstructive pulmonary disease, unspecified: Secondary | ICD-10-CM | POA: Diagnosis not present

## 2016-07-13 DIAGNOSIS — Z0000 Encounter for general adult medical examination without abnormal findings: Secondary | ICD-10-CM | POA: Diagnosis not present

## 2016-07-13 DIAGNOSIS — Z79899 Other long term (current) drug therapy: Secondary | ICD-10-CM | POA: Diagnosis not present

## 2016-07-13 DIAGNOSIS — I10 Essential (primary) hypertension: Secondary | ICD-10-CM | POA: Diagnosis not present

## 2016-07-13 DIAGNOSIS — F329 Major depressive disorder, single episode, unspecified: Secondary | ICD-10-CM | POA: Diagnosis not present

## 2016-07-13 DIAGNOSIS — D649 Anemia, unspecified: Secondary | ICD-10-CM | POA: Diagnosis not present

## 2016-07-13 DIAGNOSIS — Z1389 Encounter for screening for other disorder: Secondary | ICD-10-CM | POA: Diagnosis not present

## 2016-07-13 DIAGNOSIS — M353 Polymyalgia rheumatica: Secondary | ICD-10-CM | POA: Diagnosis not present

## 2016-07-13 DIAGNOSIS — I480 Paroxysmal atrial fibrillation: Secondary | ICD-10-CM | POA: Diagnosis not present

## 2016-07-15 ENCOUNTER — Encounter (HOSPITAL_COMMUNITY): Payer: Self-pay | Admitting: Emergency Medicine

## 2016-07-15 ENCOUNTER — Emergency Department (HOSPITAL_COMMUNITY): Payer: Medicare Other

## 2016-07-15 ENCOUNTER — Inpatient Hospital Stay (HOSPITAL_COMMUNITY)
Admission: EM | Admit: 2016-07-15 | Discharge: 2016-07-18 | DRG: 190 | Disposition: A | Discharge status: Home / Self Care | Payer: Medicare Other | Attending: Family Medicine | Admitting: Family Medicine

## 2016-07-15 DIAGNOSIS — Z7901 Long term (current) use of anticoagulants: Secondary | ICD-10-CM

## 2016-07-15 DIAGNOSIS — Z79899 Other long term (current) drug therapy: Secondary | ICD-10-CM

## 2016-07-15 DIAGNOSIS — J90 Pleural effusion, not elsewhere classified: Secondary | ICD-10-CM | POA: Diagnosis not present

## 2016-07-15 DIAGNOSIS — J111 Influenza due to unidentified influenza virus with other respiratory manifestations: Secondary | ICD-10-CM

## 2016-07-15 DIAGNOSIS — S81802A Unspecified open wound, left lower leg, initial encounter: Secondary | ICD-10-CM | POA: Diagnosis present

## 2016-07-15 DIAGNOSIS — R748 Abnormal levels of other serum enzymes: Secondary | ICD-10-CM | POA: Diagnosis present

## 2016-07-15 DIAGNOSIS — R778 Other specified abnormalities of plasma proteins: Secondary | ICD-10-CM

## 2016-07-15 DIAGNOSIS — D649 Anemia, unspecified: Secondary | ICD-10-CM | POA: Diagnosis present

## 2016-07-15 DIAGNOSIS — J440 Chronic obstructive pulmonary disease with acute lower respiratory infection: Secondary | ICD-10-CM | POA: Diagnosis not present

## 2016-07-15 DIAGNOSIS — J189 Pneumonia, unspecified organism: Secondary | ICD-10-CM | POA: Diagnosis present

## 2016-07-15 DIAGNOSIS — Z950 Presence of cardiac pacemaker: Secondary | ICD-10-CM

## 2016-07-15 DIAGNOSIS — I110 Hypertensive heart disease with heart failure: Secondary | ICD-10-CM | POA: Diagnosis not present

## 2016-07-15 DIAGNOSIS — J441 Chronic obstructive pulmonary disease with (acute) exacerbation: Secondary | ICD-10-CM | POA: Diagnosis present

## 2016-07-15 DIAGNOSIS — R7989 Other specified abnormal findings of blood chemistry: Secondary | ICD-10-CM

## 2016-07-15 DIAGNOSIS — M419 Scoliosis, unspecified: Secondary | ICD-10-CM | POA: Diagnosis present

## 2016-07-15 DIAGNOSIS — I495 Sick sinus syndrome: Secondary | ICD-10-CM | POA: Diagnosis present

## 2016-07-15 DIAGNOSIS — I442 Atrioventricular block, complete: Secondary | ICD-10-CM | POA: Diagnosis not present

## 2016-07-15 DIAGNOSIS — J181 Lobar pneumonia, unspecified organism: Secondary | ICD-10-CM | POA: Diagnosis not present

## 2016-07-15 DIAGNOSIS — J1000 Influenza due to other identified influenza virus with unspecified type of pneumonia: Secondary | ICD-10-CM | POA: Diagnosis not present

## 2016-07-15 DIAGNOSIS — R233 Spontaneous ecchymoses: Secondary | ICD-10-CM | POA: Diagnosis present

## 2016-07-15 DIAGNOSIS — G4700 Insomnia, unspecified: Secondary | ICD-10-CM | POA: Diagnosis present

## 2016-07-15 DIAGNOSIS — Z7952 Long term (current) use of systemic steroids: Secondary | ICD-10-CM

## 2016-07-15 DIAGNOSIS — I480 Paroxysmal atrial fibrillation: Secondary | ICD-10-CM | POA: Diagnosis present

## 2016-07-15 DIAGNOSIS — R918 Other nonspecific abnormal finding of lung field: Secondary | ICD-10-CM | POA: Diagnosis not present

## 2016-07-15 DIAGNOSIS — E119 Type 2 diabetes mellitus without complications: Secondary | ICD-10-CM | POA: Diagnosis present

## 2016-07-15 DIAGNOSIS — J1100 Influenza due to unidentified influenza virus with unspecified type of pneumonia: Secondary | ICD-10-CM | POA: Diagnosis not present

## 2016-07-15 DIAGNOSIS — F329 Major depressive disorder, single episode, unspecified: Secondary | ICD-10-CM | POA: Diagnosis present

## 2016-07-15 DIAGNOSIS — J9601 Acute respiratory failure with hypoxia: Secondary | ICD-10-CM | POA: Diagnosis present

## 2016-07-15 DIAGNOSIS — I951 Orthostatic hypotension: Secondary | ICD-10-CM | POA: Diagnosis present

## 2016-07-15 DIAGNOSIS — R0603 Acute respiratory distress: Secondary | ICD-10-CM

## 2016-07-15 DIAGNOSIS — I248 Other forms of acute ischemic heart disease: Secondary | ICD-10-CM | POA: Diagnosis present

## 2016-07-15 DIAGNOSIS — X58XXXA Exposure to other specified factors, initial encounter: Secondary | ICD-10-CM

## 2016-07-15 DIAGNOSIS — R0602 Shortness of breath: Secondary | ICD-10-CM | POA: Diagnosis not present

## 2016-07-15 DIAGNOSIS — M353 Polymyalgia rheumatica: Secondary | ICD-10-CM | POA: Diagnosis present

## 2016-07-15 DIAGNOSIS — I5032 Chronic diastolic (congestive) heart failure: Secondary | ICD-10-CM | POA: Diagnosis present

## 2016-07-15 DIAGNOSIS — R069 Unspecified abnormalities of breathing: Secondary | ICD-10-CM | POA: Diagnosis not present

## 2016-07-15 LAB — BRAIN NATRIURETIC PEPTIDE: B NATRIURETIC PEPTIDE 5: 536.6 pg/mL — AB (ref 0.0–100.0)

## 2016-07-15 LAB — CBC WITH DIFFERENTIAL/PLATELET
Basophils Absolute: 0 10*3/uL (ref 0.0–0.1)
Basophils Relative: 0 %
EOS PCT: 0 %
Eosinophils Absolute: 0 10*3/uL (ref 0.0–0.7)
HCT: 35.2 % — ABNORMAL LOW (ref 36.0–46.0)
Hemoglobin: 10.4 g/dL — ABNORMAL LOW (ref 12.0–15.0)
LYMPHS ABS: 0.8 10*3/uL (ref 0.7–4.0)
LYMPHS PCT: 7 %
MCH: 24.4 pg — AB (ref 26.0–34.0)
MCHC: 29.5 g/dL — ABNORMAL LOW (ref 30.0–36.0)
MCV: 82.6 fL (ref 78.0–100.0)
Monocytes Absolute: 1.2 10*3/uL — ABNORMAL HIGH (ref 0.1–1.0)
Monocytes Relative: 10 %
Neutro Abs: 9.8 10*3/uL — ABNORMAL HIGH (ref 1.7–7.7)
Neutrophils Relative %: 83 %
PLATELETS: 282 10*3/uL (ref 150–400)
RBC: 4.26 MIL/uL (ref 3.87–5.11)
RDW: 16.9 % — AB (ref 11.5–15.5)
WBC: 11.8 10*3/uL — AB (ref 4.0–10.5)

## 2016-07-15 LAB — COMPREHENSIVE METABOLIC PANEL
ALT: 14 U/L (ref 14–54)
ANION GAP: 8 (ref 5–15)
AST: 24 U/L (ref 15–41)
Albumin: 3.5 g/dL (ref 3.5–5.0)
Alkaline Phosphatase: 56 U/L (ref 38–126)
BUN: 23 mg/dL — ABNORMAL HIGH (ref 6–20)
CHLORIDE: 102 mmol/L (ref 101–111)
CO2: 27 mmol/L (ref 22–32)
Calcium: 8.2 mg/dL — ABNORMAL LOW (ref 8.9–10.3)
Creatinine, Ser: 0.72 mg/dL (ref 0.44–1.00)
Glucose, Bld: 140 mg/dL — ABNORMAL HIGH (ref 65–99)
Potassium: 4.1 mmol/L (ref 3.5–5.1)
Sodium: 137 mmol/L (ref 135–145)
Total Bilirubin: 0.8 mg/dL (ref 0.3–1.2)
Total Protein: 6.6 g/dL (ref 6.5–8.1)

## 2016-07-15 LAB — INFLUENZA PANEL BY PCR (TYPE A & B)
Influenza A By PCR: POSITIVE — AB
Influenza B By PCR: NEGATIVE

## 2016-07-15 LAB — I-STAT CG4 LACTIC ACID, ED: LACTIC ACID, VENOUS: 1.07 mmol/L (ref 0.5–1.9)

## 2016-07-15 LAB — I-STAT TROPONIN, ED: TROPONIN I, POC: 0.11 ng/mL — AB (ref 0.00–0.08)

## 2016-07-15 LAB — TROPONIN I
Troponin I: 0.8 ng/mL (ref <0.03)
Troponin I: 0.51 ng/mL (ref <0.03)

## 2016-07-15 MED ORDER — IPRATROPIUM BROMIDE 0.06 % NA SOLN
2.0000 | Freq: Four times a day (QID) | NASAL | Status: DC | PRN
  Filled 2016-07-15: qty 15

## 2016-07-15 MED ORDER — MIDODRINE HCL 2.5 MG PO TABS
2.5000 mg | ORAL_TABLET | Freq: Two times a day (BID) | ORAL | Status: DC | PRN
  Filled 2016-07-15: qty 1

## 2016-07-15 MED ORDER — METHYLPREDNISOLONE SODIUM SUCC 125 MG IJ SOLR
125.0000 mg | Freq: Once | INTRAMUSCULAR | Status: AC
  Administered 2016-07-15: 125 mg via INTRAVENOUS
  Filled 2016-07-15: qty 2

## 2016-07-15 MED ORDER — ESCITALOPRAM OXALATE 10 MG PO TABS
10.0000 mg | ORAL_TABLET | Freq: Every day | ORAL | Status: DC
  Administered 2016-07-15 – 2016-07-18 (×4): 10 mg via ORAL
  Filled 2016-07-15 (×4): qty 1

## 2016-07-15 MED ORDER — PREDNISONE 50 MG PO TABS
50.0000 mg | ORAL_TABLET | Freq: Every day | ORAL | Status: DC
  Administered 2016-07-16 – 2016-07-17 (×2): 50 mg via ORAL
  Filled 2016-07-15 (×2): qty 1

## 2016-07-15 MED ORDER — ALPRAZOLAM 0.25 MG PO TABS
0.2500 mg | ORAL_TABLET | Freq: Every evening | ORAL | Status: DC | PRN
  Administered 2016-07-15 – 2016-07-17 (×3): 0.25 mg via ORAL
  Filled 2016-07-15 (×3): qty 1

## 2016-07-15 MED ORDER — SODIUM CHLORIDE 0.9% FLUSH: 3.0000 mL | INTRAVENOUS | Status: DC | PRN

## 2016-07-15 MED ORDER — IPRATROPIUM BROMIDE 0.02 % IN SOLN
0.5000 mg | Freq: Once | RESPIRATORY_TRACT | Status: AC
Start: 2016-07-15 — End: 2016-07-15
  Administered 2016-07-15: 0.5 mg via RESPIRATORY_TRACT
  Filled 2016-07-15: qty 2.5

## 2016-07-15 MED ORDER — METOPROLOL SUCCINATE ER 25 MG PO TB24
12.5000 mg | ORAL_TABLET | Freq: Every day | ORAL | Status: DC
  Administered 2016-07-15 – 2016-07-17 (×3): 12.5 mg via ORAL
  Filled 2016-07-15 (×3): qty 1

## 2016-07-15 MED ORDER — SODIUM CHLORIDE 0.9% FLUSH
3.0000 mL | Freq: Two times a day (BID) | INTRAVENOUS | Status: DC
  Administered 2016-07-15 – 2016-07-18 (×5): 3 mL via INTRAVENOUS

## 2016-07-15 MED ORDER — BENZONATATE 100 MG PO CAPS: 100.0000 mg | ORAL_CAPSULE | Freq: Three times a day (TID) | ORAL | Status: DC | PRN

## 2016-07-15 MED ORDER — DEXTROSE 5 % IV SOLN
1.0000 g | Freq: Once | INTRAVENOUS | Status: AC
  Administered 2016-07-15: 1 g via INTRAVENOUS
  Filled 2016-07-15: qty 10

## 2016-07-15 MED ORDER — IPRATROPIUM BROMIDE 0.02 % IN SOLN
0.5000 mg | Freq: Once | RESPIRATORY_TRACT | Status: AC
  Administered 2016-07-15: 0.5 mg via RESPIRATORY_TRACT
  Filled 2016-07-15: qty 2.5

## 2016-07-15 MED ORDER — ALBUTEROL SULFATE (2.5 MG/3ML) 0.083% IN NEBU
5.0000 mg | INHALATION_SOLUTION | Freq: Once | RESPIRATORY_TRACT | Status: AC
  Administered 2016-07-15: 5 mg via RESPIRATORY_TRACT
  Filled 2016-07-15: qty 6

## 2016-07-15 MED ORDER — APIXABAN 2.5 MG PO TABS
2.5000 mg | ORAL_TABLET | Freq: Two times a day (BID) | ORAL | Status: DC
  Administered 2016-07-15 – 2016-07-18 (×6): 2.5 mg via ORAL
  Filled 2016-07-15 (×6): qty 1

## 2016-07-15 MED ORDER — SODIUM CHLORIDE 0.9 % IV SOLN: 250.0000 mL | INTRAVENOUS | Status: DC | PRN

## 2016-07-15 MED ORDER — IPRATROPIUM-ALBUTEROL 0.5-2.5 (3) MG/3ML IN SOLN
3.0000 mL | Freq: Four times a day (QID) | RESPIRATORY_TRACT | Status: DC
  Administered 2016-07-15 – 2016-07-16 (×4): 3 mL via RESPIRATORY_TRACT
  Filled 2016-07-15 (×4): qty 3

## 2016-07-15 MED ORDER — MIRTAZAPINE 15 MG PO TABS
7.5000 mg | ORAL_TABLET | Freq: Every day | ORAL | Status: DC
  Administered 2016-07-15 – 2016-07-17 (×3): 7.5 mg via ORAL
  Filled 2016-07-15 (×3): qty 1

## 2016-07-15 MED ORDER — DEXTROSE 5 % IV SOLN
500.0000 mg | Freq: Once | INTRAVENOUS | Status: AC
  Administered 2016-07-15: 500 mg via INTRAVENOUS
  Filled 2016-07-15: qty 500

## 2016-07-15 MED ORDER — METOPROLOL SUCCINATE ER 25 MG PO TB24
25.0000 mg | ORAL_TABLET | Freq: Every day | ORAL | Status: DC
  Administered 2016-07-16 – 2016-07-18 (×3): 25 mg via ORAL
  Filled 2016-07-15 (×3): qty 1

## 2016-07-15 MED ORDER — OSELTAMIVIR PHOSPHATE 30 MG PO CAPS
30.0000 mg | ORAL_CAPSULE | Freq: Two times a day (BID) | ORAL | Status: DC
  Administered 2016-07-15 – 2016-07-18 (×7): 30 mg via ORAL
  Filled 2016-07-15 (×8): qty 1

## 2016-07-15 MED ORDER — SODIUM CHLORIDE 0.9 % IV BOLUS (SEPSIS)
500.0000 mL | Freq: Once | INTRAVENOUS | Status: AC
  Administered 2016-07-15: 500 mL via INTRAVENOUS

## 2016-07-15 MED ORDER — ALBUTEROL SULFATE (2.5 MG/3ML) 0.083% IN NEBU: 5.0000 mg | INHALATION_SOLUTION | RESPIRATORY_TRACT | Status: DC | PRN

## 2016-07-15 MED ORDER — SODIUM CHLORIDE 0.9 % IV SOLN
INTRAVENOUS | Status: DC
  Administered 2016-07-15: 14:00:00 via INTRAVENOUS

## 2016-07-15 NOTE — ED Notes (Signed)
One blood culture sent to main lab 

## 2016-07-15 NOTE — ED Triage Notes (Addendum)
Per EMS patient seen at PCP for cough on Wednesday.  Sent home but woke up this morning with worsening cough.  Patient oxygen saturation 77% on RA.  Placed on 4L via [place] @ 96%.  Patient has pacemaker.

## 2016-07-15 NOTE — ED Notes (Signed)
Hospitalist at bedside 

## 2016-07-15 NOTE — ED Notes (Addendum)
Rebekah at bedside attempting to draw 2nd blood culture and lactic.   RT en route to administer nebulizer treatment.

## 2016-07-15 NOTE — H&P (Signed)
History and Physical   MALALA KIMERY [id number] DOB: 30-Jan-1931 DOA: 07/15/2016  Referring MD/NP/PA: Dr. Maryan Rued, [place] PCP: Mathews Argyle, MD  Patient coming from: Home  Chief Complaint: shortness of breath  HPI: PAISLEY SHEFFLER is a 83 y.o. female with a history of COPD, PMR on chronic prednisone, diastolic CHF, PAF on eliquis s/p PPM, orthostatic hypotension on prn midodrine who presents with shortness of breath and wheezing. She reports 2 days of throat irritation/soreness that was mild, noted incidentally at recent PCP check up. She was felt to have viral URI, recommended supportive therapy. This continued until last night when she developed abrupt dyspnea, wheezing with associated cough prompting call to EMS. She also endorses fever. On arrival they found SpO2 to be in 70%'s improved to 90%'s on nasal cannula. On arrival to ED she required brief BiPAP for increased work of breathing and hypoxemia. Nebulizer treatments were provided with improvement. This was quickly tapered back to 4L by nasal cannula. Initial troponin was 0.11, though she denies chest pain. BNP was 536, improved from 907. Cr wnl, WBC 11.8. CXR suggested new infiltrate so abx were started. Flu swab returned positive and tamiflu was started. [facility] called to admit for hypoxemic respiratory failure.   Review of Systems: + poor appetite and decreased po. Denies HA, vision changes, chest pain, palpitations, abd pain, N/V/D, dysuria, urinary changes, myalgias or arthralgias, and per HPI. All others reviewed and are negative.   Past Medical History:  Diagnosis Date  . CHB (complete heart block) ([place]) 07/13/2013   Pacemaker dependent  . CHF (congestive heart failure) ([place])   . COPD (chronic obstructive pulmonary disease) ([place])   . DM (dermatomyositis)   . Orthostatic hypotension   . Pacemaker 07/13/2013   Her original pacemaker and the current leads were implanted in 1992. She has had 2 generator change out,  most recently in 2010. Her device is a Buyer, retail 2110 non RF dual-chamber pacemaker with a battery longevity estimated at about 7 years. The atrial lead is a [employer] Jude [id number] and the ventricular lead was a Biotronik [id number].   [name] Paroxysmal atrial fibrillation ([place]) 09/04/2014  . Scoliosis   . SSS (sick sinus syndrome) ([place]) 07/13/2013  . Syncope   . Systemic hypertension   . Thoracic kyphosis     Past Surgical History:  Procedure Laterality Date  . NM MYOCAR PERF WALL MOTION  09/13/2011   Low risk  . PERMANENT PACEMAKER GENERATOR CHANGE  03/27/2009   [employer]ude  . US ECHOCARDIOGRAPHY  03/28/2012   Mod LAE,mild MR,aortic sclerosis w/mod AI,mod. TR,mild PI,Stage I diastolic dysfunction    - Nonsmoker. No EtOH. No illicit drugs.   No Known Allergies  History reviewed. No pertinent family history. - Family history otherwise reviewed and not pertinent.  Prior to Admission medications   Medication Sig Start Date End Date Taking? Authorizing Provider  alendronate (FOSAMAX) 70 MG tablet Take 70 mg by mouth every Saturday. Take with a full glass of water on an empty stomach.   Yes Historical Provider, MD  ALPRAZolam (XANAX) 0.25 MG tablet Take 0.25 mg by mouth at bedtime.    Yes Historical Provider, MD  apixaban (ELIQUIS) 2.5 MG TABS tablet Take 1 tablet (2.5 mg total) by mouth 2 (two) times daily. 12/14/15  Yes Mihai Croitoru, MD  cholecalciferol (VITAMIN D) 1000 units tablet Take 1,000 Units by mouth 2 (two) times daily.   Yes Historical Provider, MD  escitalopram (LEXAPRO) 10 MG tablet Take 10 mg  by mouth daily.   Yes Historical Provider, MD  furosemide (LASIX) 40 MG tablet Take 40 mg by mouth daily as needed (for weight gain of greater than 2lbs).   Yes Historical Provider, MD  ipratropium (ATROVENT) 0.06 % nasal spray Place 2 sprays into both nostrils 4 (four) times daily as needed for rhinitis.   Yes Historical Provider, MD  Ipratropium-Albuterol (COMBIVENT RESPIMAT) 20-100 MCG/ACT AERS  respimat Inhale 1 puff into the lungs every 6 (six) hours as needed for wheezing or shortness of breath. 08/29/15  Yes Barton Dubois, MD  metoprolol succinate (TOPROL-XL) 25 MG 24 hr tablet Take 12.5-25 tablets by mouth 2 (two) times daily. Pt takes one tablet in the morning and one-half tablet at bedtime.   Yes Historical Provider, MD  midodrine (PROAMATINE) 2.5 MG tablet Take 2.5 mg by mouth as needed (when systolic BP is lower than 100).    Yes Historical Provider, MD  mirtazapine (REMERON) 15 MG tablet Take 7.5 mg by mouth at bedtime.    Yes Historical Provider, MD  potassium chloride SA (K-DUR,KLOR-CON) 20 MEQ tablet Take 20 mEq by mouth daily as needed (when taking Lasix).    Yes Historical Provider, MD  predniSONE (DELTASONE) 10 MG tablet Take 15 mg by mouth daily.    Yes Historical Provider, MD    Physical Exam: Vitals:   07/15/16 1045 07/15/16 1100 07/15/16 1131 07/15/16 1149  BP:  108/61 102/59 95/58  Pulse: 74 72 76 82  Resp: 20 17 25 26   Temp:      TempSrc:      SpO2: 97% 98% 93% 97%  Weight:      Height:       Constitutional: 83 y.o. female in no distress, calm demeanor Eyes: Lids and conjunctivae normal, PERRL ENMT: Mucous membranes are moist. Posterior pharynx clear of any exudate or lesions. Fair dentition.  Neck: normal, supple, no masses, no thyromegaly Respiratory: Increased rate and gets labored with longer sentences, but nonlabored at rest on 4L by [place]. End-expiratory wheezing scattered bilaterally. No focal findings. Cardiovascular: Regular rate and rhythm, no murmurs, rubs, or gallops. No carotid bruits. No JVD. No LE edema. 2+ pedal pulses. Abdomen: Normoactive bowel sounds. No tenderness, non-distended, and no masses palpated. No hepatosplenomegaly. GU: No indwelling catheter Musculoskeletal: No clubbing / cyanosis. No joint deformity upper and lower extremities. Good ROM, no contractures. Normal muscle tone.  Skin: Warm, dry, very thin, +senile purpura without  significant ecchymosis. 4cm superficial skin avulsion on left lower shin without drainage or erythema.  Neurologic: CN II-XII grossly intact. Gait not assessed. Speech normal. No focal deficits in motor strength or sensation in all extremities.  Psychiatric: Alert and oriented x3. Normal judgment and insight. Mood euthymic with congruent affect.   Labs on Admission: I have personally reviewed following labs and imaging studies  CBC:  Recent Labs Lab 07/15/16 0957  WBC 11.8*  NEUTROABS 9.8*  HGB 10.4*  HCT 35.2*  MCV 82.6  PLT [id number]   Basic Metabolic Panel:  Recent Labs Lab 07/15/16 0957  NA 137  K 4.1  CL 102  CO2 27  GLUCOSE 140*  BUN 23*  CREATININE 0.72  CALCIUM 8.2*   GFR: Estimated Creatinine Clearance: 47.9 mL/min (by C-G formula based on SCr of 0.72 mg/dL). Liver Function Tests:  Recent Labs Lab 07/15/16 0957  AST 24  ALT 14  ALKPHOS 56  BILITOT 0.8  PROT 6.6  ALBUMIN 3.5   No results for input(s): LIPASE, AMYLASE in the last  168 hours. No results for input(s): AMMONIA in the last 168 hours. Coagulation Profile: No results for input(s): INR, PROTIME in the last 168 hours. Cardiac Enzymes: No results for input(s): CKTOTAL, CKMB, CKMBINDEX, TROPONINI in the last 168 hours. BNP (last 3 results) No results for input(s): PROBNP in the last 8760 hours. HbA1C: No results for input(s): HGBA1C in the last 72 hours. CBG: No results for input(s): GLUCAP in the last 168 hours. Lipid Profile: No results for input(s): CHOL, HDL, LDLCALC, TRIG, CHOLHDL, LDLDIRECT in the last 72 hours. Thyroid Function Tests: No results for input(s): TSH, T4TOTAL, FREET4, T3FREE, THYROIDAB in the last 72 hours. Anemia Panel: No results for input(s): VITAMINB12, FOLATE, FERRITIN, TIBC, IRON, RETICCTPCT in the last 72 hours. Urine analysis:    Component Value Date/Time   COLORURINE YELLOW 06/07/2015 0254   APPEARANCEUR CLOUDY (A) 06/07/2015 0254   LABSPEC 1.014 06/07/2015 0254     PHURINE 6.0 06/07/2015 0254   GLUCOSEU 500 (A) 06/07/2015 0254   HGBUR LARGE (A) 06/07/2015 0254   BILIRUBINUR NEGATIVE 06/07/2015 0254   KETONESUR NEGATIVE 06/07/2015 0254   PROTEINUR NEGATIVE 06/07/2015 0254   UROBILINOGEN 1.0 01/19/2011 1030   NITRITE NEGATIVE 06/07/2015 0254   LEUKOCYTESUR NEGATIVE 06/07/2015 0254   Sepsis Labs: @LABRCNTIP (procalcitonin:4,lacticidven:4) )No results found for this or any previous visit (from the past 240 hour(s)).   Radiological Exams on Admission: Dg Chest Port 1 View  Result Date: 07/15/2016 CLINICAL DATA:  Severe breathing trouble. History of asthma/ COPD and congestive heart failure. EXAM: PORTABLE CHEST 1 VIEW COMPARISON:  10/31/2015 and 08/25/2015. FINDINGS: 0944 hours. Two AP views obtained. The heart size and mediastinal contours are stable. Right subclavian pacemaker leads appear unchanged. The previously demonstrated pleural effusions have improved. There is some residual asymmetric density at the right lung base which has improved. In addition, there is possible asymmetric right perihilar density, partially obscured by the pacemaker generator. The left lung is clear. There is no pneumothorax. No acute osseous findings are seen. IMPRESSION: The previously demonstrated pleural effusions have improved/resolved, although there are questionable asymmetric right perihilar and basilar airspace opacities which could reflect early inflammation. Followup PA and lateral chest X-ray is recommended in 3-4 weeks (following trial of antibiotic therapy as clinically warranted) to ensure resolution and exclude underlying malignancy. Electronically Signed   By: Richardean Sale M.D.   On: 07/15/2016 10:14   EKG: Independently reviewed. Paced rhythm.  Assessment/Plan Active Problems:   * No active hospital problems. *   Acute hypoxemic respiratory failure: Due to influenza on baseline poor pulmonary function due to long-standing COPD. Has required O2  transiently after past 2 admissions.  - Oxygen by nasal cannula to maintain SpO2 88 - 92%. Has shown steady improvement and is in no distress on oxygen by nasal cannula, so is stable for floor.  - Treat conditions as below  COPD exacerbation due to influenza: ?pneumonia on CXR: Abx given in ED prior to +flu PCR. Will hold these for now and repeat CXR in AM. Lactate wnl. - Tamiflu x5 days - Duonebs q6h / albuterol neb q3h prn - Give solumedrol 125mg  IV x1 in ED and continue augmented prednisone dose. - Blood cultures - Tessalon prn cough  - Per COPD gold protocol, consults to care management and social work have been placed  Polymyalgia rheumatica: No current symptoms. On chronic steroids, recently decreased dose prednisone 20mg -15mg  alternating to 15mg  daily.  - Hold chronic steroids while giving burst.  Chronic diastolic CHF: Last echo March 2017,  EF 60-65%, G1DD. Doubt significant component of CHF with BNP down from prior. Weights have been steadily rising over past 12 months with improving nutrition. 127lbs on admission, last was 121lbs, but currently appearing clinically euvolemic.  - Cardiologist, Dr. Sallyanne Kuster requested that patient contact him with any admission. Cardiology made aware of admission without indication for formal consultation at this time.  - DC IVF and hold her prn lasix. - Daily weight, strict I/O  Troponin elevation: Initial troponin 0.11 without chest pain. ECG paced. Suspect demand ischemia.  - Cycle cardiac enzymes and monitor for chest pain  Sick sinus syndrome and PAF on eliquis: s/p PPM with stable rate, 100% paced.   - Continue metoprolol and eliquis.  Orthostatic hypotension: Currently hypotensive - Continue prn low dose midodrine if SBP < 161mmHg.  LLE wound: New, noninfected. Due to thin skin. - Ointment and keep covered.  Depression/insomnia: Chronic, stable - Continue lexapro, trazodone  Anemia: Mild, chronic. Hgb 10.4 on admission, has been 10.1  - 11.2 in past 6 months. No evidence of bleeding.  - Check iron studies - Monitor CBC  DVT prophylaxis: Eliquis  Code Status: Full  Family Communication: Daughter at bedside Disposition Plan: Anticipate home with oxygen once improved Consults called: Cardiology notified of admission  Admission status: Observation    Vance Gather, MD [facility] Pager [phone]  If 7PM-7AM, please contact night-coverage [email or web address] Password [facility] 07/15/2016, 12:07 PM

## 2016-07-15 NOTE — ED Notes (Signed)
RN at bedside starting IV will collect labs 

## 2016-07-15 NOTE — ED Notes (Signed)
DG in process.

## 2016-07-15 NOTE — ED Provider Notes (Signed)
[facility] DEPT Provider Note   CSN: [id number] Arrival date & time: 07/15/16  [id number]     History   Chief Complaint Chief Complaint  Patient presents with  . Shortness of Breath    HPI IDALIS STCYR is a 83 y.o. female.  Patient is an 83 year old female with history of CHF, heart block, paroxysmal atrial fibrillation on Eliquis, and COPD presenting today with worsening shortness of breath, rales and wheezing. Patient states 2 days ago she had a slight tickle in her throat when she saw her PCP for her annual 3 month checkup. At that time her lungs are clear she was down a few pounds however yesterday she started to have more of a cough and then overnight she abruptly became very short of breath. When paramedics arrived patient's oxygen saturation on room air was in the 70s. Patient was placed on 4 L nasal cannula with improvement of oxygen saturation to the mid 90s. Patient denies fever, leg swelling, chest pain, abdominal pain, nausea, vomiting. She did develop rhinorrhea today. Not given any medication in route.   The history is provided by the patient and a relative.  Shortness of Breath  This is a new problem. Duration: last night. The problem occurs continuously.The current episode started yesterday. The problem has been rapidly worsening. Associated symptoms include rhinorrhea, cough and wheezing. Pertinent negatives include no fever, no sputum production, no chest pain, no abdominal pain, no leg pain and no leg swelling. It is unknown what precipitated the problem. She has tried beta-agonist inhalers and oral steroids for the symptoms. The treatment provided no relief. She has had prior hospitalizations. She has had prior ED visits. Associated medical issues include COPD and pneumonia. Associated medical issues comments: chf and paf on eliquis.    Past Medical History:  Diagnosis Date  . CHB (complete heart block) ([place]) 07/13/2013   Pacemaker dependent  . CHF (congestive heart  failure) ([place])   . COPD (chronic obstructive pulmonary disease) ([place])   . DM (dermatomyositis)   . Orthostatic hypotension   . Pacemaker 07/13/2013   Her original pacemaker and the current leads were implanted in 1992. She has had 2 generator change out, most recently in 2010. Her device is a Buyer, retail 2110 non RF dual-chamber pacemaker with a battery longevity estimated at about 7 years. The atrial lead is a St. Jude [id number] and the ventricular lead was a Biotronik [id number].   [name] Paroxysmal atrial fibrillation ([place]) 09/04/2014  . Scoliosis   . SSS (sick sinus syndrome) ([place]) 07/13/2013  . Syncope   . Systemic hypertension   . Thoracic kyphosis     Patient Active Problem List   Diagnosis Date Noted  . HCAP (healthcare-associated pneumonia) 10/31/2015  . COPD (chronic obstructive pulmonary disease) ([place]) 10/31/2015  . Leukocytosis 10/31/2015  . Benign essential HTN 10/31/2015  . Anxiety state   . Acute respiratory failure with hypoxia ([place]) 08/26/2015  . COPD exacerbation ([place]) 08/26/2015  . Diastolic CHF ([place]) [id number]  . Systemic hypertension 08/26/2015  . Hypokalemia 08/26/2015  . Anticoagulant long-term use 08/26/2015  . DM (dermatomyositis) 08/26/2015  . Acute on chronic diastolic CHF (congestive heart failure), NYHA class 1 ([place])   . Sepsis ([place]) 06/07/2015  . CAP (community acquired pneumonia) 06/07/2015  . Acute CHF (congestive heart failure) ([place]) 06/07/2015  . Paroxysmal atrial fibrillation ([place]) 09/04/2014  . Pacemaker 07/13/2013  . CHB (complete heart block) ([place]) 07/13/2013  . SSS (sick sinus syndrome) ([place]) 07/13/2013  . Orthostatic hypotension  07/13/2013  . Aortic insufficiency 07/13/2013    Past Surgical History:  Procedure Laterality Date  . NM MYOCAR PERF WALL MOTION  09/13/2011   Low risk  . PERMANENT PACEMAKER GENERATOR CHANGE  03/27/2009   St.Jude  . US ECHOCARDIOGRAPHY  03/28/2012   Mod LAE,mild MR,aortic sclerosis w/mod AI,mod. TR,mild PI,Stage I  diastolic dysfunction    OB History    No data available       Home Medications    Prior to Admission medications   Medication Sig Start Date End Date Taking? Authorizing Provider  alendronate (FOSAMAX) 70 MG tablet take 70mg s once weekly 08/18/15   Historical Provider, MD  ALPRAZolam Duanne Moron) 0.25 MG tablet Take 0.25 mg by mouth at bedtime.     Historical Provider, MD  apixaban (ELIQUIS) 2.5 MG TABS tablet Take 1 tablet (2.5 mg total) by mouth 2 (two) times daily. 12/14/15   Mihai Croitoru, MD  escitalopram (LEXAPRO) 10 MG tablet take 10 mgs daily 08/10/15   Historical Provider, MD  furosemide (LASIX) 40 MG tablet Take 1 tablet (40 mg total) by mouth daily as needed for fluid or edema (prn weight gain of 2 pounds or more overnight). 11/04/15   Burtis Junes, NP  Ipratropium-Albuterol (COMBIVENT RESPIMAT) 20-100 MCG/ACT AERS respimat Inhale 1 puff into the lungs every 6 (six) hours as needed for wheezing or shortness of breath. 08/29/15   Barton Dubois, MD  metoprolol succinate (TOPROL-XL) 25 MG 24 hr tablet Take 12.5-25 tablets by mouth 2 (two) times daily. Take 1 tablet in the morning and 1/2 tablet in the evening 05/15/15   Historical Provider, MD  midodrine (PROAMATINE) 2.5 MG tablet Take 1 tablet by mouth as needed. for systolic blood pressure lower than 100 mmHG 11/10/15   Historical Provider, MD  mirtazapine (REMERON) 15 MG tablet Take 7.5 mg by mouth at bedtime.     Historical Provider, MD  potassium chloride SA (K-DUR,KLOR-CON) 20 MEQ tablet Take 1 tablet by mouth daily as needed. For fluid or weight gain 11/02/15   Historical Provider, MD  predniSONE (DELTASONE) 10 MG tablet Take 15-20 mg by mouth daily. Alternate between 15 and 20 mgs daily 10/13/15   Historical Provider, MD    Family History History reviewed. No pertinent family history.  Social History Social History  Substance Use Topics  . Smoking status: Never Smoker  . Smokeless tobacco: Never Used  . Alcohol use No      Allergies   Patient has no known allergies.   Review of Systems Review of Systems  Constitutional: Negative for fever.  HENT: Positive for rhinorrhea.   Respiratory: Positive for cough, shortness of breath and wheezing. Negative for sputum production.   Cardiovascular: Negative for chest pain and leg swelling.  Gastrointestinal: Negative for abdominal pain.  All other systems reviewed and are negative.    Physical Exam Updated Vital Signs BP 159/74 (BP Location: Right Arm)   Pulse 93   Temp 98.8 F (37.1 C) (Oral)   Resp 20   Ht 5\' 5"  (1.651 m)   Wt 127 lb (57.6 kg)   SpO2 95%   BMI 21.13 kg/m   Physical Exam  Constitutional: She is oriented to person, place, and time. She appears well-developed and well-nourished. No distress.  HENT:  Head: Normocephalic and atraumatic.  Mouth/Throat: Oropharynx is clear and moist. Mucous membranes are dry.  Eyes: Conjunctivae and EOM are normal. Pupils are equal, round, and reactive to light.  Neck: Normal range of motion. Neck supple.  Cardiovascular: Normal rate, regular rhythm and intact distal pulses.   No murmur heard. Pulmonary/Chest: Effort normal. Tachypnea noted. No respiratory distress. She has wheezes. She has rhonchi. She has no rales.  Winded with speaking in complete sentences  Abdominal: Soft. She exhibits no distension. There is no tenderness. There is no rebound and no guarding.  Musculoskeletal: Normal range of motion. She exhibits no edema or tenderness.  Small superficial skin tear to the left shin  Neurological: She is alert and oriented to person, place, and time.  Skin: Skin is warm and dry. No rash noted. No erythema.  Psychiatric: She has a normal mood and affect. Her behavior is normal.  Nursing note and vitals reviewed.    ED Treatments / Results  Labs (all labs ordered are listed, but only abnormal results are displayed) Labs Reviewed  CBC WITH DIFFERENTIAL/PLATELET - Abnormal; Notable for the  following:       Result Value   WBC 11.8 (*)    Hemoglobin 10.4 (*)    HCT 35.2 (*)    MCH 24.4 (*)    MCHC 29.5 (*)    RDW 16.9 (*)    Neutro Abs 9.8 (*)    Monocytes Absolute 1.2 (*)    All other components within normal limits  COMPREHENSIVE METABOLIC PANEL - Abnormal; Notable for the following:    Glucose, Bld 140 (*)    BUN 23 (*)    Calcium 8.2 (*)    All other components within normal limits  BRAIN NATRIURETIC PEPTIDE - Abnormal; Notable for the following:    B Natriuretic Peptide 536.6 (*)    All other components within normal limits  I-STAT TROPOININ, ED - Abnormal; Notable for the following:    Troponin i, poc 0.11 (*)    All other components within normal limits  CULTURE, BLOOD (ROUTINE X 2)  CULTURE, BLOOD (ROUTINE X 2)  INFLUENZA PANEL BY PCR (TYPE A & B)  I-STAT CG4 LACTIC ACID, ED    EKG  EKG Interpretation  Date/Time:  Friday July 15 2016 09:45:43 EST Ventricular Rate:  91 PR Interval:    QRS Duration: 159 QT Interval:  395 QTC Calculation: 486 R Axis:   -94 Text Interpretation:  Atrial-sensed ventricular-paced rhythm No further analysis attempted due to paced rhythm Baseline wander in lead(s) V1 No significant change since last tracing Confirmed by Maryan Rued  MD, Loree Fee ([id number]) on 07/15/2016 9:50:24 AM       Radiology Dg Chest Port 1 View  Result Date: 07/15/2016 CLINICAL DATA:  Severe breathing trouble. History of asthma/ COPD and congestive heart failure. EXAM: PORTABLE CHEST 1 VIEW COMPARISON:  10/31/2015 and 08/25/2015. FINDINGS: 0944 hours. Two AP views obtained. The heart size and mediastinal contours are stable. Right subclavian pacemaker leads appear unchanged. The previously demonstrated pleural effusions have improved. There is some residual asymmetric density at the right lung base which has improved. In addition, there is possible asymmetric right perihilar density, partially obscured by the pacemaker generator. The left lung is clear.  There is no pneumothorax. No acute osseous findings are seen. IMPRESSION: The previously demonstrated pleural effusions have improved/resolved, although there are questionable asymmetric right perihilar and basilar airspace opacities which could reflect early inflammation. Followup PA and lateral chest X-ray is recommended in 3-4 weeks (following trial of antibiotic therapy as clinically warranted) to ensure resolution and exclude underlying malignancy. Electronically Signed   By: Richardean Sale M.D.   On: 07/15/2016 10:14    Procedures Procedures (including critical care  time)  Medications Ordered in ED Medications - No data to display   Initial Impression / Assessment and Plan / ED Course  I have reviewed the triage vital signs and the nursing notes.  Pertinent labs & imaging results that were available during my care of the patient were reviewed by me and considered in my medical decision making (see chart for details).    Patient is an 83 year old presenting in respiratory distress. She has diffuse rales, rhonchi and wheezing. She is afebrile here however concern for potential upper respiratory infection versus acute lash pulmonary edema. She does have a history of CHF but only takes Lasix when necessary. She denies any chest pain today she does have a pacemaker and is continuously paced. She was hypoxic when the paramedics arrived which improved with nasal cannula oxygen. She has no signs of fluid overload. Her weight is stable and no edema in the lower extremities. Patient was started on BiPAP for symptomatic relief. Initial blood pressure 159/74. CBC, CMP, troponin, BNP, chest x-ray pending. If chest x-ray is clear we'll start nebulizers. Patient does take prednisone daily. Signs of pulmonary edema will start on Lasix and nitroglycerin drip  12:09 PM Patient was x-ray with concern for new infiltrate but no evidence of fluid overload. Troponin is mildly elevated at 0.11 which is most likely  strain. BNP is improved from prior at 500. Mild leukocytosis of 11 with a normal lactic acid. CMP without evidence of renal function. Patient covered with Rocephin and azithromycin. She was also given Solu-Medrol and some mild fluids. She will be admitted for community acquired pneumonia. After initial breathing treatment BiPAP was weaned. She is currently on 4 L and looking much better.  CRITICAL CARE Performed by: Blanchie Dessert Total critical care time: 30 minutes Critical care time was exclusive of separately billable procedures and treating other patients. Critical care was necessary to treat or prevent imminent or life-threatening deterioration. Critical care was time spent personally by me on the following activities: development of treatment plan with patient and/or surrogate as well as nursing, discussions with consultants, evaluation of patient's response to treatment, examination of patient, obtaining history from patient or surrogate, ordering and performing treatments and interventions, ordering and review of laboratory studies, ordering and review of radiographic studies, pulse oximetry and re-evaluation of patient's condition.   Final Clinical Impressions(s) / ED Diagnoses   Final diagnoses:  Community acquired pneumonia, unspecified laterality  Elevated troponin  Respiratory distress    New Prescriptions New Prescriptions   No medications on file     Blanchie Dessert, MD 07/15/16 1211

## 2016-07-15 NOTE — ED Notes (Signed)
Bed: WA03 Expected date:  Expected time:  Means of arrival:  Comments: EMS-83yo Female cough, rales

## 2016-07-16 ENCOUNTER — Observation Stay (HOSPITAL_COMMUNITY): Payer: Medicare Other

## 2016-07-16 DIAGNOSIS — J1000 Influenza due to other identified influenza virus with unspecified type of pneumonia: Secondary | ICD-10-CM | POA: Diagnosis not present

## 2016-07-16 DIAGNOSIS — X58XXXA Exposure to other specified factors, initial encounter: Secondary | ICD-10-CM | POA: Diagnosis not present

## 2016-07-16 DIAGNOSIS — I248 Other forms of acute ischemic heart disease: Secondary | ICD-10-CM | POA: Diagnosis present

## 2016-07-16 DIAGNOSIS — E119 Type 2 diabetes mellitus without complications: Secondary | ICD-10-CM | POA: Diagnosis present

## 2016-07-16 DIAGNOSIS — Z7952 Long term (current) use of systemic steroids: Secondary | ICD-10-CM | POA: Diagnosis not present

## 2016-07-16 DIAGNOSIS — J181 Lobar pneumonia, unspecified organism: Secondary | ICD-10-CM | POA: Diagnosis not present

## 2016-07-16 DIAGNOSIS — J441 Chronic obstructive pulmonary disease with (acute) exacerbation: Secondary | ICD-10-CM

## 2016-07-16 DIAGNOSIS — J9601 Acute respiratory failure with hypoxia: Secondary | ICD-10-CM | POA: Diagnosis present

## 2016-07-16 DIAGNOSIS — I5032 Chronic diastolic (congestive) heart failure: Secondary | ICD-10-CM | POA: Diagnosis present

## 2016-07-16 DIAGNOSIS — Z7901 Long term (current) use of anticoagulants: Secondary | ICD-10-CM | POA: Diagnosis not present

## 2016-07-16 DIAGNOSIS — R748 Abnormal levels of other serum enzymes: Secondary | ICD-10-CM | POA: Diagnosis not present

## 2016-07-16 DIAGNOSIS — I480 Paroxysmal atrial fibrillation: Secondary | ICD-10-CM | POA: Diagnosis present

## 2016-07-16 DIAGNOSIS — J9600 Acute respiratory failure, unspecified whether with hypoxia or hypercapnia: Secondary | ICD-10-CM | POA: Diagnosis not present

## 2016-07-16 DIAGNOSIS — R233 Spontaneous ecchymoses: Secondary | ICD-10-CM | POA: Diagnosis present

## 2016-07-16 DIAGNOSIS — I951 Orthostatic hypotension: Secondary | ICD-10-CM | POA: Diagnosis present

## 2016-07-16 DIAGNOSIS — G4700 Insomnia, unspecified: Secondary | ICD-10-CM | POA: Diagnosis present

## 2016-07-16 DIAGNOSIS — D649 Anemia, unspecified: Secondary | ICD-10-CM | POA: Diagnosis present

## 2016-07-16 DIAGNOSIS — J189 Pneumonia, unspecified organism: Secondary | ICD-10-CM | POA: Diagnosis present

## 2016-07-16 DIAGNOSIS — M419 Scoliosis, unspecified: Secondary | ICD-10-CM | POA: Diagnosis present

## 2016-07-16 DIAGNOSIS — I442 Atrioventricular block, complete: Secondary | ICD-10-CM | POA: Diagnosis present

## 2016-07-16 DIAGNOSIS — J440 Chronic obstructive pulmonary disease with acute lower respiratory infection: Secondary | ICD-10-CM | POA: Diagnosis present

## 2016-07-16 DIAGNOSIS — Z950 Presence of cardiac pacemaker: Secondary | ICD-10-CM | POA: Diagnosis not present

## 2016-07-16 DIAGNOSIS — F329 Major depressive disorder, single episode, unspecified: Secondary | ICD-10-CM | POA: Diagnosis present

## 2016-07-16 DIAGNOSIS — I495 Sick sinus syndrome: Secondary | ICD-10-CM | POA: Diagnosis present

## 2016-07-16 DIAGNOSIS — J1100 Influenza due to unidentified influenza virus with unspecified type of pneumonia: Secondary | ICD-10-CM | POA: Diagnosis not present

## 2016-07-16 DIAGNOSIS — I110 Hypertensive heart disease with heart failure: Secondary | ICD-10-CM | POA: Diagnosis not present

## 2016-07-16 DIAGNOSIS — R0602 Shortness of breath: Secondary | ICD-10-CM | POA: Diagnosis not present

## 2016-07-16 DIAGNOSIS — S81802A Unspecified open wound, left lower leg, initial encounter: Secondary | ICD-10-CM | POA: Diagnosis present

## 2016-07-16 DIAGNOSIS — M353 Polymyalgia rheumatica: Secondary | ICD-10-CM | POA: Diagnosis present

## 2016-07-16 LAB — BASIC METABOLIC PANEL
Anion gap: 5 (ref 5–15)
BUN: 21 mg/dL — AB (ref 6–20)
CALCIUM: 8.4 mg/dL — AB (ref 8.9–10.3)
CO2: 30 mmol/L (ref 22–32)
CREATININE: 0.55 mg/dL (ref 0.44–1.00)
Chloride: 104 mmol/L (ref 101–111)
GFR calc Af Amer: >60 mL/min (ref >60)
GFR calc non Af Amer: >60 mL/min (ref >60)
GLUCOSE: 126 mg/dL — AB (ref 65–99)
Potassium: 4.3 mmol/L (ref 3.5–5.1)
Sodium: 139 mmol/L (ref 135–145)

## 2016-07-16 LAB — CBC
HCT: 30.9 % — ABNORMAL LOW (ref 36.0–46.0)
HEMOGLOBIN: 9.2 g/dL — AB (ref 12.0–15.0)
MCH: 24.6 pg — AB (ref 26.0–34.0)
MCHC: 29.8 g/dL — AB (ref 30.0–36.0)
MCV: 82.6 fL (ref 78.0–100.0)
PLATELETS: 263 10*3/uL (ref 150–400)
RBC: 3.74 MIL/uL — ABNORMAL LOW (ref 3.87–5.11)
RDW: 17 % — AB (ref 11.5–15.5)
WBC: 9.9 10*3/uL (ref 4.0–10.5)

## 2016-07-16 LAB — TROPONIN I: Troponin I: 0.45 ng/mL (ref <0.03)

## 2016-07-16 MED ORDER — AZITHROMYCIN 250 MG PO TABS
500.0000 mg | ORAL_TABLET | Freq: Every day | ORAL | Status: AC
  Administered 2016-07-16: 500 mg via ORAL
  Filled 2016-07-16: qty 2

## 2016-07-16 MED ORDER — DEXTROSE 5 % IV SOLN
1.0000 g | INTRAVENOUS | Status: DC
  Administered 2016-07-16 – 2016-07-17 (×2): 1 g via INTRAVENOUS
  Filled 2016-07-16 (×4): qty 10

## 2016-07-16 MED ORDER — IPRATROPIUM-ALBUTEROL 0.5-2.5 (3) MG/3ML IN SOLN
3.0000 mL | Freq: Three times a day (TID) | RESPIRATORY_TRACT | Status: DC
  Administered 2016-07-17 – 2016-07-18 (×4): 3 mL via RESPIRATORY_TRACT
  Filled 2016-07-16 (×4): qty 3

## 2016-07-16 MED ORDER — AZITHROMYCIN 250 MG PO TABS
250.0000 mg | ORAL_TABLET | Freq: Every day | ORAL | Status: DC
  Administered 2016-07-17 – 2016-07-18 (×2): 250 mg via ORAL
  Filled 2016-07-16 (×2): qty 1

## 2016-07-16 NOTE — Evaluation (Signed)
Physical Therapy Evaluation Patient Details Name: [name] MRN: [id number] DOB: [birth year] Today's Date: 07/16/2016   History of Present Illness  83 y.o. female with a history of COPD, PMR on chronic prednisone, diastolic CHF, PAF on eliquis s/p PPM, orthostatic hypotension on prn midodrine and admitted for Acute hypoxemic respiratory failure: Due to influenza on baseline poor pulmonary function due to long-standing COPD  Clinical Impression  Pt admitted with above diagnosis. Pt currently with functional limitations due to the deficits listed below (see PT Problem List).  Pt will benefit from skilled PT to increase their independence and safety with mobility to allow discharge to the venue listed below.   Pt assisted to bathroom and then ambulated in hallway.  Pt presents with generalized weakness and fatigues quickly.  Recommended pt use RW upon d/c for more stability and endurance while recovering.  Pt reports her daughter lives nearby and can call her anytime.  Pt hopeful for d/c home tomorrow however will continue to follow if remains in acute.     Follow Up Recommendations Supervision - Intermittent;No PT follow up    Equipment Recommendations  None recommended by PT    Recommendations for Other Services       Precautions / Restrictions Precautions Precautions: Fall Precaution Comments: monitor sats      Mobility  Bed Mobility Overal bed mobility: Needs Assistance Bed Mobility: Supine to Sit;Sit to Supine     Supine to sit: Supervision;HOB elevated Sit to supine: Supervision   General bed mobility comments: no physical assist, increased time  Transfers Overall transfer level: Needs assistance Equipment used: None Transfers: Sit to/from Stand Sit to Stand: Min assist         General transfer comment: min/guard from bed surface, however min assist for lower toilet  Ambulation/Gait Ambulation/Gait assistance: Min guard;Min assist Ambulation Distance (Feet):  160 Feet Assistive device: None Gait Pattern/deviations: Step-through pattern;Decreased stride length     General Gait Details: pt initially able to ambulate without UE support however fatigued quickly and held dynamap (required bil UE support), SPo2 91-95% on room however reapplied O2 [place] once back in room due to lung sounds  Stairs            Wheelchair Mobility    Modified Rankin (Stroke Patients Only)       Balance Overall balance assessment: Needs assistance         Standing balance support: Bilateral upper extremity supported Standing balance-Leahy Scale: Poor Standing balance comment: requiring UE support due to weakness and fatigue                             Pertinent Vitals/Pain Pain Assessment: No/denies pain    Home Living Family/patient expects to be discharged to:: Private residence Living Arrangements: Alone Available Help at Discharge: Family;Available PRN/intermittently Type of Home: House Home Access: Stairs to enter;Ramped entrance     Home Layout: One level Home Equipment: Walker - 2 wheels;Cane - single point Additional Comments: daughter lives nearby    Prior Function Level of Independence: Independent               Hand Dominance        Extremity/Trunk Assessment        Lower Extremity Assessment Lower Extremity Assessment: Generalized weakness    Cervical / Trunk Assessment Cervical / Trunk Assessment: Normal  Communication   Communication: No difficulties  Cognition Arousal/Alertness: Awake/alert Behavior During Therapy: WFL for tasks  assessed/performed Overall Cognitive Status: Within Functional Limits for tasks assessed                      General Comments      Exercises     Assessment/Plan    PT Assessment Patient needs continued PT services  PT Problem List Decreased strength;Decreased activity tolerance;Decreased balance;Decreased mobility;Cardiopulmonary status limiting  activity          PT Treatment Interventions DME instruction;Gait training;Therapeutic activities;Therapeutic exercise;Functional mobility training;Balance training;Patient/family education    PT Goals (Current goals can be found in the Care Plan section)  Acute Rehab PT Goals PT Goal Formulation: With patient Time For Goal Achievement: 07/23/16 Potential to Achieve Goals: Good    Frequency Min 3X/week   Barriers to discharge        Co-evaluation               End of Session Equipment Utilized During Treatment: Oxygen Activity Tolerance: Patient tolerated treatment well Patient left: in bed;with call bell/phone within reach;with bed alarm set Nurse Communication: Mobility status    Functional Assessment Tool Used: clinical judgement Functional Limitation: Mobility: Walking and moving around Mobility: Walking and Moving Around Current Status [id number]): At least 20 percent but less than 40 percent impaired, limited or restricted Mobility: Walking and Moving Around Goal Status [phone]): At least 1 percent but less than 20 percent impaired, limited or restricted    Time: 1002-1021 PT Time Calculation (min) (ACUTE ONLY): 19 min   Charges:   PT Evaluation $PT Eval Low Complexity: 1 Procedure     PT G Codes:   PT G-Codes **NOT FOR INPATIENT CLASS** Functional Assessment Tool Used: clinical judgement Functional Limitation: Mobility: Walking and moving around Mobility: Walking and Moving Around Current Status [id number]): At least 20 percent but less than 40 percent impaired, limited or restricted Mobility: Walking and Moving Around Goal Status [phone]): At least 1 percent but less than 20 percent impaired, limited or restricted    Adeana Grilliot,KATHrine E 07/16/2016, 12:36 PM Carmelia Bake, PT, DPT 07/16/2016 Pager: [phone]

## 2016-07-16 NOTE — Progress Notes (Signed)
PROGRESS NOTE [facility]   [name]   [id number] DOB: 17-Oct-1930  DOA: 07/15/2016 PCP: Mathews Argyle, MD   Brief Narrative:  [name] is a 83 y.o. female with a history of COPD, PMR on chronic prednisone, diastolic CHF, PAF on eliquis s/p PPM, orthostatic hypotension on prn midodrine who presents with shortness of breath and wheezing. She reports 2 days of throat irritation/soreness that was mild, noted incidentally at recent PCP check up. She was felt to have viral URI, recommended supportive therapy. This continued until last night when she developed abrupt dyspnea, wheezing with associated cough prompting call to EMS. She also endorses fever. On arrival they found SpO2 to be in 70%'s improved to 90%'s on nasal cannula. On arrival to ED she required brief BiPAP for increased work of breathing and hypoxemia. Nebulizer treatments were provided with improvement. This was quickly tapered back to 4L by nasal cannula. Initial troponin was 0.11, though she denies chest pain. BNP was 536, improved from 907. Cr wnl, WBC 11.8. CXR suggested new infiltrate so abx were started. Flu swab returned positive and tamiflu was started.   Subjective: Feeling much better, remains on Oxygen. No complaints. Remains afebrile.   Assessment & Plan: Acute hypoxemic respiratory failure: Due to influenza on baseline poor pulmonary function due to long-standing COPD and superimpose PNA. Has required O2 transiently after past 2 admissions. Improving  Continue O2 supplement keep sats above 89% See above  COPD exacerbation due to influenza and PNA - repeat CXR consistent with R side PNA, clinical exam with rales and decrease BS at the R mid lobe Will start abx - rocephin and azithro  Continue Tamiflu x 5 days Continue prednisone  O2 as needed  Follow up blood cultures  Get procalcitonin  Tessalon prn cough  Nebulizer q 6 PRN   Polymyalgia rheumatica: No current symptoms. On chronic  steroids, recently decreased dose prednisone 20mg -15mg  alternating to 15mg  daily.  Now on steroids for COPD exacerbation   Chronic diastolic CHF: - seem to be compensated, no signs of fluid overload. Continue home meds - BB  Lasix at home PRN - will continue to monitor   Troponin elevation: Initial troponin 0.11 without chest pain. ECG paced. Suspect demand ischemia.  TNIs trending down - EKG with no changes, asymptomatic  Will no pursue further cardiac work up - follow up with cardiology as outpatient   Sick sinus syndrome and PAF on eliquis: s/p PPM with stable rate, 100% paced.   Continue metoprolol and eliquis.  Orthostatic hypotension:  Continue prn low dose midodrine if SBP < 147mmHg. Check orthostatic   LLE wound: New, noninfected. Due to thin skin. Wound care consult   Depression/insomnia: Chronic, stable Continue lexapro, trazodone  Anemia: Mild, chronic. Hgb 10.4 on admission, has been 10.1 - 11.2 in past 6 months. No evidence of bleeding.  Iron panel  Monitor CBC   DVT prophylaxis: Eliquis  Code Status: FULL  Family Communication: Discussed with daughter by phone  Disposition Plan: None   Consultants:   None   Procedures:   None  Antimicrobials:  Tamiflu   Azithro and Rocephin 07/16/16   Objective: Vitals:   07/16/16 0425 07/16/16 0906 07/16/16 1213 07/16/16 1500  BP: (!) 152/62   (!) 131/59  Pulse: 76  79 73  Resp: 20   19  Temp: 99.3 F (37.4 C)   99 F (37.2 C)  TempSrc: Oral   Oral  SpO2: 91% 95%  99%  Weight: 59.3 kg (  130 lb 11.7 oz)     Height:        Intake/Output Summary (Last 24 hours) at 07/16/16 1622 Last data filed at 07/16/16 1346  Gross per 24 hour  Intake              240 ml  Output              950 ml  Net             -710 ml   Filed Weights   07/15/16 0942 07/15/16 1604 07/16/16 0425  Weight: 57.6 kg (127 lb) 59.9 kg (132 lb 0.9 oz) 59.3 kg (130 lb 11.7 oz)    Examination:  General exam: Appears calm and  comfortable  Respiratory system: Decrease breath sound r mid lobe, rales and rhonchi. No wheezing  Cardiovascular system: S1 & S2 heard, RRR. No JVD, murmurs, rubs or gallops Gastrointestinal system: Abdomen is nondistended, soft and nontender. Normal bowel sounds heard. Central nervous system: Alert and oriented. No focal neurological deficits. Extremities: No pedal edema.   Skin: No rashes  Psychiatry: Judgement and insight appear normal.    Data Reviewed: I have personally reviewed following labs and imaging studies  CBC:  Recent Labs Lab 07/15/16 0957 07/16/16 0530  WBC 11.8* 9.9  NEUTROABS 9.8*  --   HGB 10.4* 9.2*  HCT 35.2* 30.9*  MCV 82.6 82.6  PLT 282 [id number]   Basic Metabolic Panel:  Recent Labs Lab 07/15/16 0957 07/16/16 0530  NA 137 139  K 4.1 4.3  CL 102 104  CO2 27 30  GLUCOSE 140* 126*  BUN 23* 21*  CREATININE 0.72 0.55  CALCIUM 8.2* 8.4*   GFR: Estimated Creatinine Clearance: 47.9 mL/min (by C-G formula based on SCr of 0.55 mg/dL). Liver Function Tests:  Recent Labs Lab 07/15/16 0957  AST 24  ALT 14  ALKPHOS 56  BILITOT 0.8  PROT 6.6  ALBUMIN 3.5   No results for input(s): LIPASE, AMYLASE in the last 168 hours. No results for input(s): AMMONIA in the last 168 hours. Coagulation Profile: No results for input(s): INR, PROTIME in the last 168 hours. Cardiac Enzymes:  Recent Labs Lab 07/15/16 1544 07/15/16 2225 07/16/16 0530  TROPONINI 0.80* 0.51* 0.45*   BNP (last 3 results) No results for input(s): PROBNP in the last 8760 hours. HbA1C: No results for input(s): HGBA1C in the last 72 hours. CBG: No results for input(s): GLUCAP in the last 168 hours. Lipid Profile: No results for input(s): CHOL, HDL, LDLCALC, TRIG, CHOLHDL, LDLDIRECT in the last 72 hours. Thyroid Function Tests: No results for input(s): TSH, T4TOTAL, FREET4, T3FREE, THYROIDAB in the last 72 hours. Anemia Panel: No results for input(s): VITAMINB12, FOLATE, FERRITIN,  TIBC, IRON, RETICCTPCT in the last 72 hours. Sepsis Labs:  Recent Labs Lab 07/15/16 1040  LATICACIDVEN 1.07    Recent Results (from the past 240 hour(s))  Culture, blood (Routine X 2) w Reflex to ID Panel     Status: None (Preliminary result)   Collection Time: 07/15/16 10:33 AM  Result Value Ref Range Status   Specimen Description BLOOD RIGHT FOREARM  Final   Special Requests BOTTLES DRAWN AEROBIC AND ANAEROBIC 5ML  Final   Culture   Final    NO GROWTH < 24 HOURS Performed at [facility], [place] [address], [place], [place] [address]    Report Status PENDING  Incomplete     Radiology Studies: Dg Chest Port 1 View  Result Date: 07/16/2016 CLINICAL  DATA:  83 year old female with acute respiratory failure. EXAM: PORTABLE CHEST 1 VIEW COMPARISON:  Chest radiograph dated 07/15/2016 FINDINGS: There is hyperexpansion of the lungs with flattening of the diaphragm likely related to underlying COPD changes and air trapping. Mild diffuse interstitial coarsening appear chronic. An area of hazy density at the left lung base appears more prominent compared to the prior radiograph and may represent atelectatic changes or developing pneumonia. There has been interval resolution of the previously seen right lung base and right perihilar densities. Small linear density in the right mid lung field may represent trace amount of fluid in the minor fissure. There is no pneumothorax. Stable cardiac silhouette. A dual lead pacemaker is noted in stable position. There is atherosclerotic calcification of the aortic arch. Osteopenia with degenerative changes of the spine. No acute fracture. IMPRESSION: Left lung base hazy density appears increased since the prior radiograph and may represent atelectasis versus infiltrate. Correlation with clinical exam and follow-up to resolution is recommended. Interval resolution of the previously seen right perihilar and right lung base densities. Faint linear density in the  right mid lung field likely trace fluid in the minor fissure or focal subpleural airspace disease. Electronically Signed   By: Anner Crete M.D.   On: 07/16/2016 06:43   Dg Chest Port 1 View  Result Date: 07/15/2016 CLINICAL DATA:  Severe breathing trouble. History of asthma/ COPD and congestive heart failure. EXAM: PORTABLE CHEST 1 VIEW COMPARISON:  10/31/2015 and 08/25/2015. FINDINGS: 0944 hours. Two AP views obtained. The heart size and mediastinal contours are stable. Right subclavian pacemaker leads appear unchanged. The previously demonstrated pleural effusions have improved. There is some residual asymmetric density at the right lung base which has improved. In addition, there is possible asymmetric right perihilar density, partially obscured by the pacemaker generator. The left lung is clear. There is no pneumothorax. No acute osseous findings are seen. IMPRESSION: The previously demonstrated pleural effusions have improved/resolved, although there are questionable asymmetric right perihilar and basilar airspace opacities which could reflect early inflammation. Followup PA and lateral chest X-ray is recommended in 3-4 weeks (following trial of antibiotic therapy as clinically warranted) to ensure resolution and exclude underlying malignancy. Electronically Signed   By: Richardean Sale M.D.   On: 07/15/2016 10:14    Scheduled Meds: . apixaban  2.5 mg Oral BID  . escitalopram  10 mg Oral Daily  . ipratropium-albuterol  3 mL Nebulization Q6H  . metoprolol succinate  12.5 mg Oral QHS  . metoprolol succinate  25 mg Oral Daily  . mirtazapine  7.5 mg Oral QHS  . oseltamivir  30 mg Oral BID  . predniSONE  50 mg Oral Q breakfast  . sodium chloride flush  3 mL Intravenous Q12H   Continuous Infusions:   LOS: 0 days    Chipper Oman, MD [facility] Pager [phone]  If 7PM-7AM, please contact night-coverage [email or web address] Password [facility] 07/16/2016, 4:22 PM

## 2016-07-16 NOTE — Progress Notes (Signed)
Pharmacy Antibiotic Note  NARVELL BRANDY is a 83 y.o. female admitted on 07/15/2016 with pneumonia.  Pharmacy has been consulted for ceftriaxone dosing.  Plan:  Ceftriaxone 1g IV q24h  Dosage remains stable and need for further dosage adjustment appears unlikely at present.  Pharmacy will sign off at this time.  Please reconsult if a change in clinical status warrants re-evaluation of dosage.    Height: 5\' 5"  (165.1 cm) Weight: 130 lb 11.7 oz (59.3 kg) IBW/kg (Calculated) : 57  Temp (24hrs), Avg:99.2 F (37.3 C), Min:99 F (37.2 C), Max:99.3 F (37.4 C)   Recent Labs Lab 07/15/16 0957 07/15/16 1040 07/16/16 0530  WBC 11.8*  --  9.9  CREATININE 0.72  --  0.55  LATICACIDVEN  --  1.07  --     Estimated Creatinine Clearance: 47.9 mL/min (by C-G formula based on SCr of 0.55 mg/dL).    No Known Allergies  Antimicrobials this admission: 1/26 Tamiflu >>  1/27 Azithromycin >>  1/27 Ceftriaxone >>  Dose adjustments this admission:   Microbiology results: 1/26 BCx: ngtd 1/26 Influenza A positive  Thank you for allowing pharmacy to be a part of this patient's care.  Gretta Arab PharmD, BCPS Pager [phone] 07/16/2016 4:55 PM

## 2016-07-17 LAB — CBC WITH DIFFERENTIAL/PLATELET
BASOS ABS: 0 10*3/uL (ref 0.0–0.1)
Basophils Relative: 0 %
EOS ABS: 0 10*3/uL (ref 0.0–0.7)
EOS PCT: 0 %
HCT: 30.2 % — ABNORMAL LOW (ref 36.0–46.0)
Hemoglobin: 8.9 g/dL — ABNORMAL LOW (ref 12.0–15.0)
LYMPHS PCT: 12 %
Lymphs Abs: 0.9 10*3/uL (ref 0.7–4.0)
MCH: 24.5 pg — ABNORMAL LOW (ref 26.0–34.0)
MCHC: 29.5 g/dL — ABNORMAL LOW (ref 30.0–36.0)
MCV: 83 fL (ref 78.0–100.0)
Monocytes Absolute: 0.9 10*3/uL (ref 0.1–1.0)
Monocytes Relative: 11 %
Neutro Abs: 5.8 10*3/uL (ref 1.7–7.7)
Neutrophils Relative %: 77 %
PLATELETS: 253 10*3/uL (ref 150–400)
RBC: 3.64 MIL/uL — AB (ref 3.87–5.11)
RDW: 16.8 % — ABNORMAL HIGH (ref 11.5–15.5)
WBC: 7.6 10*3/uL (ref 4.0–10.5)

## 2016-07-17 LAB — BASIC METABOLIC PANEL
ANION GAP: 4 — AB (ref 5–15)
BUN: 21 mg/dL — ABNORMAL HIGH (ref 6–20)
CO2: 30 mmol/L (ref 22–32)
Calcium: 8.5 mg/dL — ABNORMAL LOW (ref 8.9–10.3)
Chloride: 105 mmol/L (ref 101–111)
Creatinine, Ser: 0.7 mg/dL (ref 0.44–1.00)
GFR calc Af Amer: >60 mL/min (ref >60)
GFR calc non Af Amer: >60 mL/min (ref >60)
Glucose, Bld: 95 mg/dL (ref 65–99)
POTASSIUM: 4 mmol/L (ref 3.5–5.1)
SODIUM: 139 mmol/L (ref 135–145)

## 2016-07-17 LAB — PROCALCITONIN: Procalcitonin: 1.89 ng/mL

## 2016-07-17 MED ORDER — HYDROCODONE-HOMATROPINE 5-1.5 MG/5ML PO SYRP: 5.0000 mL | ORAL_SOLUTION | ORAL | Status: DC | PRN

## 2016-07-17 MED ORDER — PREDNISONE 20 MG PO TABS
40.0000 mg | ORAL_TABLET | Freq: Every day | ORAL | Status: DC
  Administered 2016-07-18: 40 mg via ORAL
  Filled 2016-07-17: qty 2

## 2016-07-17 NOTE — Progress Notes (Signed)
PROGRESS NOTE [facility]   [name]   [id number] DOB: May 09, 1931  DOA: 07/15/2016 PCP: Mathews Argyle, MD   Brief Narrative:  [name] is a 83 y.o. female with a history of COPD, PMR on chronic prednisone, diastolic CHF, PAF on eliquis s/p PPM, orthostatic hypotension on prn midodrine who presents with shortness of breath and wheezing. She reports 2 days of throat irritation/soreness that was mild, noted incidentally at recent PCP check up. She was felt to have viral URI, recommended supportive therapy. This continued until last night when she developed abrupt dyspnea, wheezing with associated cough prompting call to EMS. She also endorses fever. On arrival they found SpO2 to be in 70%'s improved to 90%'s on nasal cannula. On arrival to ED she required brief BiPAP for increased work of breathing and hypoxemia. Nebulizer treatments were provided with improvement. This was quickly tapered back to 4L by nasal cannula. Initial troponin was 0.11, though she denies chest pain. BNP was 536, improved from 907. Cr wnl, WBC 11.8. CXR suggested new infiltrate so abx were started. Flu swab returned positive and tamiflu was started.   Subjective: Doing well, off O2, concern about increase in cough and does not feel safe ambulating by her self. No other complaints, remains afebrile tolerating diet well.   Assessment & Plan: Acute hypoxemic respiratory failure: Due to influenza on baseline poor pulmonary function due to long-standing COPD and superimpose PNA. Has required O2 transiently after past 2 admissions. Improving  Continue O2 supplement keep sats above 89% See below   COPD exacerbation due to influenza and PNA - repeat CXR consistent with R side PNA, clinical exam with rales and decrease BS at the R mid lobe Will start abx - rocephin and azithro  Continue Tamiflu x 5 days Continue prednisone - patient on long tem  O2 as needed keep sat above 88 % - check pulse ox  while ambulating  Follow up blood cultures - no growth UTD  Check procalcitonin  Hycodan added PRN  Tessalon prn cough  Nebulizer q 6 PRN   Petechial rash 2/2 to viral syndrome  Monitor    Polymyalgia rheumatica: No current symptoms. On chronic steroids, recently decreased dose prednisone 20mg -15mg  alternating to 15mg  daily.  Now higher dose steroids for COPD exacerbation   Chronic diastolic CHF: - seem to be compensated, no signs of fluid overload. Continue home meds - BB  Lasix at home PRN - will continue to monitor   Troponin elevation: Initial troponin 0.11 without chest pain. ECG paced. Suspect demand ischemia.  TNIs trending down - EKG with no changes, asymptomatic  Will no pursue further cardiac work up - follow up with cardiology as outpatient   Sick sinus syndrome and PAF on eliquis: s/p PPM with stable rate, 100% paced.   Continue metoprolol and eliquis.  Orthostatic hypotension:  Continue prn low dose midodrine if SBP < 13mmHg. Orthostatic vitals positive - BP ok continue current treatment   LLE wound: New, noninfected. Due to thin skin. Wound care consult  Keep silicon gel and ointment.   Depression/insomnia: Chronic, stable Continue lexapro, trazodone  Anemia: Mild, chronic. Hgb 10.4 on admission, has been 10.1 - 11.2 in past 6 months. - No evidence of bleeding.  Anemia panel  H/H stable  Monitor CBC   DVT prophylaxis: Eliquis  Code Status: FULL  Family Communication: Discussed with daughter by phone  Disposition Plan: None   Consultants:   None   Procedures:   None  Antimicrobials:  Tamiflu   Azithro and Rocephin 07/16/16   Objective: Vitals:   07/17/16 0850 07/17/16 1001 07/17/16 1343 07/17/16 1347  BP:  (!) 154/64 (!) 153/63   Pulse:  76 67   Resp:   16   Temp:   98.4 F (36.9 C)   TempSrc:   Oral   SpO2: 94%  94% 94%  Weight:      Height:        Intake/Output Summary (Last 24 hours) at 07/17/16 1452 Last data filed at  07/17/16 1230  Gross per 24 hour  Intake              270 ml  Output             2300 ml  Net            -2030 ml   Filed Weights   07/15/16 1604 07/16/16 0425 07/17/16 0500  Weight: 59.9 kg (132 lb 0.9 oz) 59.3 kg (130 lb 11.7 oz) 60.4 kg (133 lb 2.5 oz)    Examination:  General exam: NAD mild anxious  Respiratory system: Aeration improved - coarse breath sound at the left lower base. Mild expiratory wheezing throughout  Cardiovascular system: S1S2 RRR no murmurs  Central nervous system: AAOx 3  Extremities: No edema  Skin: New pethecial rash at all 4 extremities    Data Reviewed: I have personally reviewed following labs and imaging studies  CBC:  Recent Labs Lab 07/15/16 0957 07/16/16 0530 07/17/16 0519  WBC 11.8* 9.9 7.6  NEUTROABS 9.8*  --  5.8  HGB 10.4* 9.2* 8.9*  HCT 35.2* 30.9* 30.2*  MCV 82.6 82.6 83.0  PLT 282 263 [id number]   Basic Metabolic Panel:  Recent Labs Lab 07/15/16 0957 07/16/16 0530 07/17/16 0519  NA 137 139 139  K 4.1 4.3 4.0  CL 102 104 105  CO2 27 30 30   GLUCOSE 140* 126* 95  BUN 23* 21* 21*  CREATININE 0.72 0.55 0.70  CALCIUM 8.2* 8.4* 8.5*   GFR: Estimated Creatinine Clearance: 47.9 mL/min (by C-G formula based on SCr of 0.7 mg/dL). Liver Function Tests:  Recent Labs Lab 07/15/16 0957  AST 24  ALT 14  ALKPHOS 56  BILITOT 0.8  PROT 6.6  ALBUMIN 3.5   No results for input(s): LIPASE, AMYLASE in the last 168 hours. No results for input(s): AMMONIA in the last 168 hours. Coagulation Profile: No results for input(s): INR, PROTIME in the last 168 hours. Cardiac Enzymes:  Recent Labs Lab 07/15/16 1544 07/15/16 2225 07/16/16 0530  TROPONINI 0.80* 0.51* 0.45*   BNP (last 3 results) No results for input(s): PROBNP in the last 8760 hours. HbA1C: No results for input(s): HGBA1C in the last 72 hours. CBG: No results for input(s): GLUCAP in the last 168 hours. Lipid Profile: No results for input(s): CHOL, HDL, LDLCALC, TRIG,  CHOLHDL, LDLDIRECT in the last 72 hours. Thyroid Function Tests: No results for input(s): TSH, T4TOTAL, FREET4, T3FREE, THYROIDAB in the last 72 hours. Anemia Panel: No results for input(s): VITAMINB12, FOLATE, FERRITIN, TIBC, IRON, RETICCTPCT in the last 72 hours. Sepsis Labs:  Recent Labs Lab 07/15/16 1040  LATICACIDVEN 1.07    Recent Results (from the past 240 hour(s))  Culture, blood (Routine X 2) w Reflex to ID Panel     Status: None (Preliminary result)   Collection Time: 07/15/16 10:33 AM  Result Value Ref Range Status   Specimen Description BLOOD RIGHT FOREARM  Final   Special Requests BOTTLES DRAWN AEROBIC  AND ANAEROBIC 5ML  Final   Culture   Final    NO GROWTH 2 DAYS Performed at [facility], [place] [address], [place], [place] [address]    Report Status PENDING  Incomplete  Culture, blood (Routine X 2) w Reflex to ID Panel     Status: None (Preliminary result)   Collection Time: 07/15/16 10:25 PM  Result Value Ref Range Status   Specimen Description BLOOD RIGHT FOREARM  Final   Special Requests BOTTLES DRAWN AEROBIC ONLY 5ML  Final   Culture   Final    NO GROWTH 1 DAY Performed at [facility], [place] [address] [place], [place] [address]    Report Status PENDING  Incomplete     Radiology Studies: Dg Chest Port 1 View  Result Date: 07/16/2016 CLINICAL DATA:  83 year old female with acute respiratory failure. EXAM: PORTABLE CHEST 1 VIEW COMPARISON:  Chest radiograph dated 07/15/2016 FINDINGS: There is hyperexpansion of the lungs with flattening of the diaphragm likely related to underlying COPD changes and air trapping. Mild diffuse interstitial coarsening appear chronic. An area of hazy density at the left lung base appears more prominent compared to the prior radiograph and may represent atelectatic changes or developing pneumonia. There has been interval resolution of the previously seen right lung base and right perihilar densities. Small linear density in  the right mid lung field may represent trace amount of fluid in the minor fissure. There is no pneumothorax. Stable cardiac silhouette. A dual lead pacemaker is noted in stable position. There is atherosclerotic calcification of the aortic arch. Osteopenia with degenerative changes of the spine. No acute fracture. IMPRESSION: Left lung base hazy density appears increased since the prior radiograph and may represent atelectasis versus infiltrate. Correlation with clinical exam and follow-up to resolution is recommended. Interval resolution of the previously seen right perihilar and right lung base densities. Faint linear density in the right mid lung field likely trace fluid in the minor fissure or focal subpleural airspace disease. Electronically Signed   By: Anner Crete M.D.   On: 07/16/2016 06:43    Scheduled Meds: . apixaban  2.5 mg Oral BID  . azithromycin  250 mg Oral Daily  . cefTRIAXone (ROCEPHIN)  IV  1 g Intravenous Q24H  . escitalopram  10 mg Oral Daily  . ipratropium-albuterol  3 mL Nebulization TID  . metoprolol succinate  12.5 mg Oral QHS  . metoprolol succinate  25 mg Oral Daily  . mirtazapine  7.5 mg Oral QHS  . oseltamivir  30 mg Oral BID  . predniSONE  50 mg Oral Q breakfast  . sodium chloride flush  3 mL Intravenous Q12H   Continuous Infusions:   LOS: 1 day    Chipper Oman, MD [facility] Pager [phone]  If 7PM-7AM, please contact night-coverage [email or web address] Password [email or web address] 07/17/2016, 2:52 PM

## 2016-07-17 NOTE — Progress Notes (Signed)
SATURATION QUALIFICATIONS: (This note is used to comply with regulatory documentation for home oxygen)  Patient Saturations on Room Air at Rest = 95%  Patient Saturations on Room Air while Ambulating = 93-95%  Patient Saturations on 0 Liters of oxygen while Ambulating = 93-95%  Please briefly explain why patient needs home oxygen:

## 2016-07-18 DIAGNOSIS — R748 Abnormal levels of other serum enzymes: Secondary | ICD-10-CM

## 2016-07-18 LAB — CBC
HCT: 31.3 % — ABNORMAL LOW (ref 36.0–46.0)
Hemoglobin: 9.3 g/dL — ABNORMAL LOW (ref 12.0–15.0)
MCH: 24.3 pg — AB (ref 26.0–34.0)
MCHC: 29.7 g/dL — ABNORMAL LOW (ref 30.0–36.0)
MCV: 81.9 fL (ref 78.0–100.0)
PLATELETS: 270 10*3/uL (ref 150–400)
RBC: 3.82 MIL/uL — AB (ref 3.87–5.11)
RDW: 16.9 % — AB (ref 11.5–15.5)
WBC: 7.2 10*3/uL (ref 4.0–10.5)

## 2016-07-18 MED ORDER — CEFDINIR 300 MG PO CAPS
300.0000 mg | ORAL_CAPSULE | Freq: Two times a day (BID) | ORAL | 0 refills | Status: AC
Start: 2016-07-18 — End: 2016-07-23

## 2016-07-18 MED ORDER — OSELTAMIVIR PHOSPHATE 30 MG PO CAPS: 30.0000 mg | ORAL_CAPSULE | Freq: Two times a day (BID) | ORAL | 0 refills | Status: DC

## 2016-07-18 MED ORDER — AZITHROMYCIN 250 MG PO TABS: ORAL_TABLET | ORAL | 0 refills | Status: DC

## 2016-07-18 MED ORDER — BENZONATATE 100 MG PO CAPS: 100.0000 mg | ORAL_CAPSULE | Freq: Three times a day (TID) | ORAL | 0 refills | Status: DC | PRN

## 2016-07-18 NOTE — Progress Notes (Signed)
Patient discharged home, discharge instructions/prescrition given and explained to patient and she verbalized understanding, denies any pain/distress. Patient wants to eat lunch and waiting on daughter to transport her home.

## 2016-07-18 NOTE — Discharge Summary (Signed)
Physician Discharge Summary  CHANDRIKA LIVINGSTON  [id number]  DOB: 1931-05-16  DOA: 07/15/2016 PCP: Mathews Argyle, MD  Admit date: 07/15/2016 Discharge date: 07/18/2016  Admitted From: Home  Disposition:  Home   Recommendations for Outpatient Follow-up:  1. Follow up with PCP in 1- week 2. Please obtain BMP/CBC in one week - check hemoglobin 3. Please refer to cardiology for cardiac evaluation  4. Please follow up final blood cultures - no growth up to date  Discharge Condition: Stable  CODE STATUS: FULL Diet recommendation: Heart Healthy  Brief/Interim Summary: Josy Hamed Winslowis a 83 y.o.femalewith a history of COPD, PMR on chronic prednisone, diastolic CHF, PAF on eliquis s/p PPM, orthostatic hypotension on prn midodrine who presents with shortness of breath and wheezing. She reports 2 days of throat irritation/soreness that was mild, noted incidentally at recent PCP check up. She was felt to have viral URI, recommended supportive therapy. This continued until last night when she developed abrupt dyspnea, wheezing with associated cough prompting call to EMS. She also endorses fever. On arrival they found SpO2 to be in 70%'s improved to 90%'s on nasal cannula. On arrival to ED she required brief BiPAP for increased work of breathing and hypoxemia. Nebulizer treatments were provided with improvement. This was quickly tapered back to 4L by nasal cannula. Initial troponin was 0.11, though she denies chest pain. BNP was 536, improved from 907. Cr wnl, WBC 11.8. CXR suggested new infiltrate so abx were started. Flu swab returned positive and tamiflu was started. Troponin were cycle peak at 0.80 then trended down. Patient had no EKG changes, cardiology was called and recommended to follow up as outpatient. Patient subsequently was weaned off oxygen and now is breathing ion RA with good saturations, cough has improved and patient remained afebrile during the hospital stay. Patient felt to be  stable for discharge to follow up with her PCP in 1 week.   Subjective: Patient seen and examined at bedside with rounding team. Patient report doing much better, denies cough, chest pain and SOB.   Discharge Diagnoses/Hospital Course:  Acute hypoxemic respiratory failure: Due to influenza on baseline poor pulmonary function due to long-standing COPD and superimpose PNA. Has required O2 transiently after past 2 admissions. Resolved   Not requiring oxygen supplementation   COPD exacerbation due to Influenza A and PNA - repeat CXR consistent with R side PNA, clinical exam with rales and decrease BS at the R mid lobe Treated with rocephin and azithromycin - completed 3 days of rocephin will continue azithromycin to complete 5 day Switch rocephin to [facility] for 5 days  - procalcitonin still elevated therefore will continue dual abx coverage.  Continue Tamiflu x 5 days Continue prednisone - patient on long tem  Ambulatory sat above 91% on RA  Follow up blood cultures - no growth UTD   Tessalon prn cough   Petechial rash 2/2 to viral syndrome   Monitor  - will resolved on its own   Polymyalgia rheumatica:No current symptoms. On chronic steroids, recently decreased dose prednisone 20mg -15mg  alternating to 15mg  daily.  Continue home dose steroids   Chronic diastolic CHF: - seem to be compensated, no signs of fluid overload. Continue home meds - BB  Lasix at home PRN - will continue to monitor   Troponin elevation: Initial troponin 0.11 without chest pain. ECG paced. Suspect demand ischemia.  TNIs trending down - EKG with no changes, asymptomatic  Will no pursue further cardiac work up - follow up with cardiology as  outpatient   Sick sinus syndrome and PAF on eliquis: s/p PPM with stable rate, 100% paced.  Continue metoprolol and eliquis.  Orthostatic hypotension:  Continue prn low dose midodrine if SBP < 139mmHg. Orthostatic vitals positive - BP ok continue current treatment    LLE wound: New, noninfected. Due to thin skin. Petrolatum  Follow up with PCP   Depression/insomnia: Chronic, stable Continue lexapro, trazodone  Anemia chronic - unknown etiology - No evidence of bleeding.  H/H stable  Follow up with PCP for CBC and anemia panel check up     Discharge Instructions  You were cared for by a hospitalist during your hospital stay. If you have any questions about your discharge medications or the care you received while you were in the hospital after you are discharged, you can call the unit and asked to speak with the hospitalist on call if the hospitalist that took care of you is not available. Once you are discharged, your primary care physician will handle any further medical issues. Please note that NO REFILLS for any discharge medications will be authorized once you are discharged, as it is imperative that you return to your primary care physician (or establish a relationship with a primary care physician if you do not have one) for your aftercare needs so that they can reassess your need for medications and monitor your lab values.  Discharge Instructions    Call MD for:  difficulty breathing, headache or visual disturbances    Complete by:  As directed    Call MD for:  extreme fatigue    Complete by:  As directed    Call MD for:  hives    Complete by:  As directed    Call MD for:  persistant dizziness or light-headedness    Complete by:  As directed    Call MD for:  persistant nausea and vomiting    Complete by:  As directed    Call MD for:  redness, tenderness, or signs of infection (pain, swelling, redness, odor or green/yellow discharge around incision site)    Complete by:  As directed    Call MD for:  severe uncontrolled pain    Complete by:  As directed    Call MD for:  temperature >100.4    Complete by:  As directed    Diet - low sodium heart healthy    Complete by:  As directed    Discharge instructions    Complete by:  As  directed    Increase activity slowly    Complete by:  As directed      Allergies as of 07/18/2016   No Known Allergies     Medication List    TAKE these medications   alendronate 70 MG tablet Commonly known as:  FOSAMAX Take 70 mg by mouth every Saturday. Take with a full glass of water on an empty stomach.   ALPRAZolam 0.25 MG tablet Commonly known as:  XANAX Take 0.25 mg by mouth at bedtime.   apixaban 2.5 MG Tabs tablet Commonly known as:  ELIQUIS Take 1 tablet (2.5 mg total) by mouth 2 (two) times daily.   azithromycin 250 MG tablet Commonly known as:  ZITHROMAX Take 1 pill a day Start taking on:  07/19/2016   benzonatate 100 MG capsule Commonly known as:  TESSALON Take 1 capsule (100 mg total) by mouth 3 (three) times daily as needed for cough.   cefdinir 300 MG capsule Commonly known as:  OMNICEF Take  1 capsule (300 mg total) by mouth 2 (two) times daily.   cholecalciferol 1000 units tablet Commonly known as:  VITAMIN D Take 1,000 Units by mouth 2 (two) times daily.   escitalopram 10 MG tablet Commonly known as:  LEXAPRO Take 10 mg by mouth daily.   furosemide 40 MG tablet Commonly known as:  LASIX Take 40 mg by mouth daily as needed (for weight gain of greater than 2lbs).   ipratropium 0.06 % nasal spray Commonly known as:  ATROVENT Place 2 sprays into both nostrils 4 (four) times daily as needed for rhinitis.   Ipratropium-Albuterol 20-100 MCG/ACT Aers respimat Commonly known as:  COMBIVENT RESPIMAT Inhale 1 puff into the lungs every 6 (six) hours as needed for wheezing or shortness of breath.   metoprolol succinate 25 MG 24 hr tablet Commonly known as:  TOPROL-XL Take 12.5-25 tablets by mouth 2 (two) times daily. Pt takes one tablet in the morning and one-half tablet at bedtime.   midodrine 2.5 MG tablet Commonly known as:  PROAMATINE Take 2.5 mg by mouth as needed (when systolic BP is lower than 100).   mirtazapine 15 MG tablet Commonly known  as:  REMERON Take 7.5 mg by mouth at bedtime.   oseltamivir 30 MG capsule Commonly known as:  TAMIFLU Take 1 capsule (30 mg total) by mouth 2 (two) times daily.   potassium chloride SA 20 MEQ tablet Commonly known as:  K-DUR,KLOR-CON Take 20 mEq by mouth daily as needed (when taking Lasix).   predniSONE 10 MG tablet Commonly known as:  DELTASONE Take 15 mg by mouth daily.       No Known Allergies  Consultations:  None    Procedures/Studies: Dg Chest Port 1 View  Result Date: 07/16/2016 CLINICAL DATA:  83 year old female with acute respiratory failure. EXAM: PORTABLE CHEST 1 VIEW COMPARISON:  Chest radiograph dated 07/15/2016 FINDINGS: There is hyperexpansion of the lungs with flattening of the diaphragm likely related to underlying COPD changes and air trapping. Mild diffuse interstitial coarsening appear chronic. An area of hazy density at the left lung base appears more prominent compared to the prior radiograph and may represent atelectatic changes or developing pneumonia. There has been interval resolution of the previously seen right lung base and right perihilar densities. Small linear density in the right mid lung field may represent trace amount of fluid in the minor fissure. There is no pneumothorax. Stable cardiac silhouette. A dual lead pacemaker is noted in stable position. There is atherosclerotic calcification of the aortic arch. Osteopenia with degenerative changes of the spine. No acute fracture. IMPRESSION: Left lung base hazy density appears increased since the prior radiograph and may represent atelectasis versus infiltrate. Correlation with clinical exam and follow-up to resolution is recommended. Interval resolution of the previously seen right perihilar and right lung base densities. Faint linear density in the right mid lung field likely trace fluid in the minor fissure or focal subpleural airspace disease. Electronically Signed   By: Anner Crete M.D.   On:  07/16/2016 06:43   Dg Chest Port 1 View  Result Date: 07/15/2016 CLINICAL DATA:  Severe breathing trouble. History of asthma/ COPD and congestive heart failure. EXAM: PORTABLE CHEST 1 VIEW COMPARISON:  10/31/2015 and 08/25/2015. FINDINGS: 0944 hours. Two AP views obtained. The heart size and mediastinal contours are stable. Right subclavian pacemaker leads appear unchanged. The previously demonstrated pleural effusions have improved. There is some residual asymmetric density at the right lung base which has improved. In addition, there  is possible asymmetric right perihilar density, partially obscured by the pacemaker generator. The left lung is clear. There is no pneumothorax. No acute osseous findings are seen. IMPRESSION: The previously demonstrated pleural effusions have improved/resolved, although there are questionable asymmetric right perihilar and basilar airspace opacities which could reflect early inflammation. Followup PA and lateral chest X-ray is recommended in 3-4 weeks (following trial of antibiotic therapy as clinically warranted) to ensure resolution and exclude underlying malignancy. Electronically Signed   By: Richardean Sale M.D.   On: 07/15/2016 10:14    Discharge Exam: Vitals:   07/18/16 0608 07/18/16 0942  BP: (!) 163/68 128/63  Pulse: 78 74  Resp: 19   Temp: 98.7 F (37.1 C)    Vitals:   07/18/16 0457 07/18/16 0608 07/18/16 0820 07/18/16 0942  BP:  (!) 163/68  128/63  Pulse:  78  74  Resp:  19    Temp:  98.7 F (37.1 C)    TempSrc:  Oral    SpO2:  94% 95%   Weight: 58.7 kg (129 lb 4.8 oz)     Height:        General: Pt is alert, awake, not in acute distress Cardiovascular: RRR, S1/S2 +, no rubs, no gallops Respiratory: CTA bilaterally, no wheezing, no rhonchi Abdominal: Soft, NT, ND, bowel sounds + Extremities: Mild all extremity petechia, no edema, no cyanosis   The results of significant diagnostics from this hospitalization (including imaging,  microbiology, ancillary and laboratory) are listed below for reference.     Microbiology: Recent Results (from the past 240 hour(s))  Culture, blood (Routine X 2) w Reflex to ID Panel     Status: None (Preliminary result)   Collection Time: 07/15/16 10:33 AM  Result Value Ref Range Status   Specimen Description BLOOD RIGHT FOREARM  Final   Special Requests BOTTLES DRAWN AEROBIC AND ANAEROBIC 5ML  Final   Culture   Final    NO GROWTH 2 DAYS Performed at [facility], [place] [address], [place], [place] [address]    Report Status PENDING  Incomplete  Culture, blood (Routine X 2) w Reflex to ID Panel     Status: None (Preliminary result)   Collection Time: 07/15/16 10:25 PM  Result Value Ref Range Status   Specimen Description BLOOD RIGHT FOREARM  Final   Special Requests BOTTLES DRAWN AEROBIC ONLY 5ML  Final   Culture   Final    NO GROWTH 1 DAY Performed at [facility], [place] [address], [place],  [address]    Report Status PENDING  Incomplete     Labs: BNP (last 3 results)  Recent Labs  10/31/15 1818 11/04/15 1522 07/15/16 0957  BNP 907.1* 525.6* [id number]*   Basic Metabolic Panel:  Recent Labs Lab 07/15/16 0957 07/16/16 0530 07/17/16 0519  NA 137 139 139  K 4.1 4.3 4.0  CL 102 104 105  CO2 27 30 30   GLUCOSE 140* 126* 95  BUN 23* 21* 21*  CREATININE 0.72 0.55 0.70  CALCIUM 8.2* 8.4* 8.5*   Liver Function Tests:  Recent Labs Lab 07/15/16 0957  AST 24  ALT 14  ALKPHOS 56  BILITOT 0.8  PROT 6.6  ALBUMIN 3.5   No results for input(s): LIPASE, AMYLASE in the last 168 hours. No results for input(s): AMMONIA in the last 168 hours. CBC:  Recent Labs Lab 07/15/16 0957 07/16/16 0530 07/17/16 0519 07/18/16 0444  WBC 11.8* 9.9 7.6 7.2  NEUTROABS 9.8*  --  5.8  --  HGB 10.4* 9.2* 8.9* 9.3*  HCT 35.2* 30.9* 30.2* 31.3*  MCV 82.6 82.6 83.0 81.9  PLT 282 263 253 270   Cardiac Enzymes:  Recent Labs Lab 07/15/16 1544 07/15/16 2225  07/16/16 0530  TROPONINI 0.80* 0.51* 0.45*   BNP: Invalid input(s): POCBNP CBG: No results for input(s): GLUCAP in the last 168 hours. D-Dimer No results for input(s): DDIMER in the last 72 hours. Hgb A1c No results for input(s): HGBA1C in the last 72 hours. Lipid Profile No results for input(s): CHOL, HDL, LDLCALC, TRIG, CHOLHDL, LDLDIRECT in the last 72 hours. Thyroid function studies No results for input(s): TSH, T4TOTAL, T3FREE, THYROIDAB in the last 72 hours.  Invalid input(s): FREET3 Anemia work up No results for input(s): VITAMINB12, FOLATE, FERRITIN, TIBC, IRON, RETICCTPCT in the last 72 hours. Urinalysis    Component Value Date/Time   COLORURINE YELLOW 06/07/2015 0254   APPEARANCEUR CLOUDY (A) 06/07/2015 0254   LABSPEC 1.014 06/07/2015 0254   PHURINE 6.0 06/07/2015 0254   GLUCOSEU 500 (A) 06/07/2015 0254   HGBUR LARGE (A) 06/07/2015 0254   BILIRUBINUR NEGATIVE 06/07/2015 0254   KETONESUR NEGATIVE 06/07/2015 0254   PROTEINUR NEGATIVE 06/07/2015 0254   UROBILINOGEN 1.0 01/19/2011 1030   NITRITE NEGATIVE 06/07/2015 0254   LEUKOCYTESUR NEGATIVE 06/07/2015 0254   Sepsis Labs Invalid input(s): PROCALCITONIN,  WBC,  LACTICIDVEN Microbiology Recent Results (from the past 240 hour(s))  Culture, blood (Routine X 2) w Reflex to ID Panel     Status: None (Preliminary result)   Collection Time: 07/15/16 10:33 AM  Result Value Ref Range Status   Specimen Description BLOOD RIGHT FOREARM  Final   Special Requests BOTTLES DRAWN AEROBIC AND ANAEROBIC 5ML  Final   Culture   Final    NO GROWTH 2 DAYS Performed at [facility], [place] [address] [place], [place] [address]    Report Status PENDING  Incomplete  Culture, blood (Routine X 2) w Reflex to ID Panel     Status: None (Preliminary result)   Collection Time: 07/15/16 10:25 PM  Result Value Ref Range Status   Specimen Description BLOOD RIGHT FOREARM  Final   Special Requests BOTTLES DRAWN AEROBIC ONLY 5ML  Final    Culture   Final    NO GROWTH 1 DAY Performed at [facility], [place] [address] [place], [place] [address]    Report Status PENDING  Incomplete    Time coordinating discharge: Over 30 minutes  SIGNED:  Chipper Oman, MD  [facility] 07/18/2016, 11:39 AM Pager   If 7PM-7AM, please contact night-coverage [email or web address] Password [email or web address]

## 2016-07-18 NOTE — Progress Notes (Signed)
Patient discharged home with daughter, transported to the car by staff and transported home by daughter. Patient denies nay pain/distress, no pressure ulcer noted.

## 2016-07-18 NOTE — Progress Notes (Signed)
Pt states that she will not need HH at discharge. Pt is not home bound.

## 2016-07-18 NOTE — Progress Notes (Signed)
Physical Therapy Treatment Patient Details Name: [name] MRN: [id number] DOB: 06-Jun-1931 Today's Date: 07/18/2016    History of Present Illness 83 y.o. female with a history of COPD, PMR on chronic prednisone, diastolic CHF, PAF on eliquis s/p PPM, orthostatic hypotension on prn midodrine and admitted for Acute hypoxemic respiratory failure: Due to influenza on baseline poor pulmonary function due to long-standing COPD    PT Comments    Pt ambulated 400' without an assistive device, no loss of balance, HR 103, 1-2/4 dyspnea (noted O2 sats 93-95% on RA walking last night per RN note). PT goals met, pt ready to DC home from PT standpoint. PT signing off since pt is independent with mobility.   Follow Up Recommendations  Supervision - Intermittent;No PT follow up     Equipment Recommendations  None recommended by PT    Recommendations for Other Services       Precautions / Restrictions Precautions Precautions: Fall Restrictions Weight Bearing Restrictions: No    Mobility  Bed Mobility Overal bed mobility: Independent Bed Mobility: Supine to Sit;Sit to Supine     Supine to sit: Independent     General bed mobility comments: no physical assist  Transfers Overall transfer level: Independent Equipment used: None Transfers: Sit to/from Stand Sit to Stand: Independent         General transfer comment: sit to stand x 2 from bed and then from commode, no physical assist, no LOB  Ambulation/Gait Ambulation/Gait assistance: Independent Ambulation Distance (Feet): 400 Feet Assistive device: None Gait Pattern/deviations: WFL(Within Functional Limits)   Gait velocity interpretation: at or above normal speed for age/gender General Gait Details: steady, no LOB, 1-2/4 dyspnea, (per RN note ambulation sats last night were 93-95% on RA), HR 103 walking   Stairs            Wheelchair Mobility    Modified Rankin (Stroke Patients Only)       Balance      Sitting balance-Leahy Scale: Good     Standing balance support: Bilateral upper extremity supported Standing balance-Leahy Scale: Good Standing balance comment: requiring UE support due to weakness and fatigue                    Cognition Arousal/Alertness: Awake/alert Behavior During Therapy: WFL for tasks assessed/performed Overall Cognitive Status: Within Functional Limits for tasks assessed                      Exercises      General Comments        Pertinent Vitals/Pain Pain Assessment: No/denies pain    Home Living                      Prior Function            PT Goals (current goals can now be found in the care plan section) Acute Rehab PT Goals Patient Stated Goal: likes to take bus to [facility] [place] PT Goal Formulation: With patient Time For Goal Achievement: 07/23/16 Potential to Achieve Goals: Good Progress towards PT goals: Goals met/education completed, patient discharged from PT    Frequency    Min 3X/week      PT Plan Current plan remains appropriate    Co-evaluation             End of Session Equipment Utilized During Treatment: Gait belt Activity Tolerance: Patient tolerated treatment well Patient left: in bed;with call bell/phone within reach  Time: 0109-3235 PT Time Calculation (min) (ACUTE ONLY): 18 min  Charges:  $Gait Training: 8-22 mins                    G Codes:  Mobility: Walking and Moving Around Discharge Status [phone]): 0 percent impaired, limited or restricted   [name] 07/18/2016, 11:21 AM [phone]

## 2016-07-20 LAB — CULTURE, BLOOD (ROUTINE X 2): CULTURE: NO GROWTH

## 2016-07-21 LAB — CULTURE, BLOOD (ROUTINE X 2): CULTURE: NO GROWTH

## 2016-07-25 ENCOUNTER — Ambulatory Visit (INDEPENDENT_AMBULATORY_CARE_PROVIDER_SITE_OTHER): Payer: Medicare Other | Admitting: Cardiovascular Disease

## 2016-07-25 ENCOUNTER — Encounter: Payer: Self-pay | Admitting: Cardiovascular Disease

## 2016-07-25 VITALS — BP 105/65 | HR 74 | Ht 65.0 in | Wt 126.0 lb

## 2016-07-25 DIAGNOSIS — I495 Sick sinus syndrome: Secondary | ICD-10-CM | POA: Diagnosis not present

## 2016-07-25 DIAGNOSIS — I480 Paroxysmal atrial fibrillation: Secondary | ICD-10-CM | POA: Diagnosis not present

## 2016-07-25 DIAGNOSIS — I5032 Chronic diastolic (congestive) heart failure: Secondary | ICD-10-CM

## 2016-07-25 DIAGNOSIS — Z7901 Long term (current) use of anticoagulants: Secondary | ICD-10-CM

## 2016-07-25 DIAGNOSIS — I951 Orthostatic hypotension: Secondary | ICD-10-CM

## 2016-07-25 DIAGNOSIS — I442 Atrioventricular block, complete: Secondary | ICD-10-CM

## 2016-07-25 DIAGNOSIS — Z950 Presence of cardiac pacemaker: Secondary | ICD-10-CM

## 2016-07-25 DIAGNOSIS — R748 Abnormal levels of other serum enzymes: Secondary | ICD-10-CM

## 2016-07-25 DIAGNOSIS — I10 Essential (primary) hypertension: Secondary | ICD-10-CM

## 2016-07-25 NOTE — Patient Instructions (Signed)
Dr Sallyanne Kuster recommends that you continue on your current medications as directed. Please refer to the Current Medication list given to you today.  Remote monitoring is used to monitor your Pacemaker of ICD from home. This monitoring reduces the number of office visits required to check your device to one time per year. It allows [place] to keep an eye on the functioning of your device to ensure it is working properly. You are scheduled for a device check from home on Monday, May 7th, 2018. You may send your transmission at any time that day. If you have a wireless device, the transmission will be sent automatically. After your physician reviews your transmission, you will receive a postcard with your next transmission date.  Dr Sallyanne Kuster recommends that you schedule a follow-up appointment in 12 months with a pacemaker check. You will receive a reminder letter in the mail two months in advance. If you don't receive a letter, please call our office to schedule the follow-up appointment.  If you need a refill on your cardiac medications before your next appointment, please call your pharmacy.

## 2016-07-25 NOTE — Progress Notes (Signed)
Patient ID: [name], female   DOB: 07-Jan-1931, 83 y.o.   MRN: [id number]    Cardiology Office Note    Date:  07/25/2016   ID:  [name], DOB 09-08-1930, MRN [id number]  PCP:  Mathews Argyle, MD  Cardiologist:  Sanda Klein, MD   Chief Complaint  Patient presents with  . Follow-up    History of Present Illness:  [name] is a 83 y.o. female with complete heart block, sinus node dysfunction and pacemaker documentation of asymptomatic paroxysmal atrial fibrillation, diastolic heart failure and mild aortic insufficiency, on chronic prednisone for polymyalgia rheumatica.  She was recently hospitalized with pneumonia secondary to influenza. During that time she had a very mild increase in troponin to 0.11, she does not describe angina.  She has had a little more dizziness since her hospitalization and her blood pressure is relatively low today. She has not recently required any diuretics she remains borderline underweight with a BMI just under 21.  She is taking Eliquis and has not had any bleeding problems or neurological events. She is able to lie completely supine and walk fairly briskly without complaints of shortness of breath. She denies angina, edema, syncope, palpitations, intermittent claudication. She complains of very easy bruising has not had serious bleeding complications.  Pacemaker interrogation today shows normal device function. A brief increase in the burden of atrial fibrillation is seen associated with her recent hospitalization as well as with a previous episode of respiratory infection at occurred after Thanksgiving. Other than that her burden of atrial fibrillation is very low at 1.1%. High ventricular rates or not an issue since she has complete heart block Estimated battery longevity is 1.7 years. She has 87% atrial pacing and is pacemaker dependent with 100% ventricular pacing due to complete heart block.  She has had varied estimation of the  severity of her aortic insufficiency by echocardiography but has a normal size left ventricle with normal left ventricular function and no other significant valvular abnormalities. Most recent echocardiogram showed only mild aortic insufficiency.   Her original pacemaker and the current leads were implanted in 1992. She has had 2 generator change out procedures, most recently in 2010. Her device is a Buyer, retail 2110 non RF dual-chamber pacemaker. The atrial lead is a St. Jude [id number] and the ventricular lead was a Biotronik [id number].    Past Medical History:  Diagnosis Date  . CHB (complete heart block) ([place]) 07/13/2013   Pacemaker dependent  . CHF (congestive heart failure) ([place])   . COPD (chronic obstructive pulmonary disease) ([place])   . DM (dermatomyositis)   . Orthostatic hypotension   . Pacemaker 07/13/2013   Her original pacemaker and the current leads were implanted in 1992. She has had 2 generator change out, most recently in 2010. Her device is a Buyer, retail 2110 non RF dual-chamber pacemaker with a battery longevity estimated at about 7 years. The atrial lead is a St. Jude [id number] and the ventricular lead was a Biotronik [id number].   [name] Paroxysmal atrial fibrillation ([place]) 09/04/2014  . Scoliosis   . SSS (sick sinus syndrome) ([place]) 07/13/2013  . Syncope   . Systemic hypertension   . Thoracic kyphosis     Past Surgical History:  Procedure Laterality Date  . NM MYOCAR PERF WALL MOTION  09/13/2011   Low risk  . PERMANENT PACEMAKER GENERATOR CHANGE  03/27/2009   St.Jude  . US ECHOCARDIOGRAPHY  03/28/2012   Mod LAE,mild MR,aortic sclerosis w/mod AI,mod.  TR,mild PI,Stage I diastolic dysfunction    Current Medications: Outpatient Medications Prior to Visit  Medication Sig Dispense Refill  . alendronate (FOSAMAX) 70 MG tablet Take 70 mg by mouth every Saturday. Take with a full glass of water on an empty stomach.    . ALPRAZolam (XANAX) 0.25 MG tablet Take 0.25 mg by mouth at bedtime.      [name] azithromycin (ZITHROMAX) 250 MG tablet Take 1 pill a day 2 each 0  . cholecalciferol (VITAMIN D) 1000 units tablet Take 1,000 Units by mouth 2 (two) times daily.    [name] escitalopram (LEXAPRO) 10 MG tablet Take 10 mg by mouth daily.    . furosemide (LASIX) 40 MG tablet Take 40 mg by mouth daily as needed (for weight gain of greater than 2lbs).    [name] ipratropium (ATROVENT) 0.06 % nasal spray Place 2 sprays into both nostrils 4 (four) times daily as needed for rhinitis.    . Ipratropium-Albuterol (COMBIVENT RESPIMAT) 20-100 MCG/ACT AERS respimat Inhale 1 puff into the lungs every 6 (six) hours as needed for wheezing or shortness of breath. 1 Inhaler 2  . metoprolol succinate (TOPROL-XL) 25 MG 24 hr tablet Take 12.5-25 tablets by mouth 2 (two) times daily. Pt takes one tablet in the morning and one-half tablet at bedtime.  3  . midodrine (PROAMATINE) 2.5 MG tablet Take 2.5 mg by mouth as needed (when systolic BP is lower than 100).     . mirtazapine (REMERON) 15 MG tablet Take 7.5 mg by mouth at bedtime.     . potassium chloride SA (K-DUR,KLOR-CON) 20 MEQ tablet Take 20 mEq by mouth daily as needed (when taking Lasix).     . predniSONE (DELTASONE) 10 MG tablet Take 15 mg by mouth daily.   3  . apixaban (ELIQUIS) 2.5 MG TABS tablet Take 1 tablet (2.5 mg total) by mouth 2 (two) times daily. 180 tablet 2  . benzonatate (TESSALON) 100 MG capsule Take 1 capsule (100 mg total) by mouth 3 (three) times daily as needed for cough. (Patient not taking: Reported on 07/25/2016) 20 capsule 0  . oseltamivir (TAMIFLU) 30 MG capsule Take 1 capsule (30 mg total) by mouth 2 (two) times daily. (Patient not taking: Reported on 07/25/2016) 4 capsule 0   No facility-administered medications prior to visit.      Allergies:   Patient has no known allergies.   Social History   Social History  . Marital status: Married    Spouse name: N/A  . Number of children: N/A  . Years of education: N/A   Social History Main Topics    . Smoking status: Never Smoker  . Smokeless tobacco: Never Used  . Alcohol use No  . Drug use: No  . Sexual activity: Not Asked   Other Topics Concern  . None   Social History Narrative  . None       ROS:   Please see the history of present illness.    ROS All other systems reviewed and are negative.   PHYSICAL EXAM:   VS:  BP 105/65 (BP Location: Right Arm, Patient Position: Sitting, Cuff Size: Normal)   Pulse 74   Ht 5\' 5"  (1.651 m)   Wt 57.2 kg (126 lb)   BMI 20.97 kg/m    GEN: Well nourished, well developed, in no acute distress.  He is very slender HEENT: normal  Neck: no JVD, carotid bruits, or masses Cardiac: Paradoxically split second heart sound, RRR; no murmurs, rubs, or  gallops,no edema; healthy subclavian pacemaker site  Respiratory:  Possibly a faint pleural rub in the left posterior base, otherwise clear to auscultation bilaterally, normal work of breathing; no wheezes or rhonchi heard GI: soft, nontender, nondistended, + BS MS: no deformity or atrophy  Skin: warm and dry, no rash Neuro:  Alert and Oriented x 3, Strength and sensation are intact Psych: euthymic mood, full affect  Wt Readings from Last 3 Encounters:  07/25/16 57.2 kg (126 lb)  07/18/16 58.7 kg (129 lb 4.8 oz)  02/04/16 55.6 kg (122 lb 9.6 oz)      Studies/Labs Reviewed:   EKG:  EKG is ordered today.  It demonstrates 100% AV sequential pacing  Recent Labs: 11/02/2015: Magnesium 1.9 07/15/2016: ALT 14; B Natriuretic Peptide 536.6 07/17/2016: BUN 21; Creatinine, Ser 0.70; Potassium 4.0; Sodium 139 07/18/2016: Hemoglobin 9.3; Platelets 270       ASSESSMENT:    No diagnosis found.   PLAN:  In order of problems listed above:  1. AFib: As before, her episodes of atrial fibrillation are very frequently associated with respiratory infections. Overall the burden of atrial fibrillation is very low. 2. SSS: Normal pacemaker function, appropriate heart rate histogram distribution. He  requires almost 90% atrial pacing.  3. CHB: She is pacemaker dependent, 100% V pacing 4. [id number] remote downloads every 3 months and office visit yearly 5. CHF:On today's exam she appears to be clinically euvolemic, NYHA functional class I-II. She is taking diuretics on an "as needed basis" only now. 6. Orthostatic hypotension: Reinforced the use of conservative methods such as slow changes in position, compression stockings, adequate hydration. If she continues to have symptomatic hypotension, ProAmatine during daytime only would be a better choice than fludrocortisone, in order to avoid heart failure. Reminded her that she should not lay flat within 4-6 hours of taking this medication Cannot exclude that she may have a component of adrenal insufficiency, but her treatment with prednisone has been relatively recent and in relatively low doses. 7. Anticoagulation: She should continue anticoagulation since her embolic risk is high. CHADSVasc 5 (age 54, gender, heart failure, hypertension). No serious bleeding complications to date and no falls recently. 8. HTN: low normal blood pressure today.  9. Elevated troponin not consistent with a pattern of true acute coronary syndrome, but rather demand ischemia in the setting of respiratory infection and atrial fibrillation.   Medication Adjustments/Labs and Tests Ordered: Current medicines are reviewed at length with the patient today.  Concerns regarding medicines are outlined above.  Medication changes, Labs and Tests ordered today are listed in the Patient Instructions below. There are no Patient Instructions on file for this visit.   Signed, Sanda Klein, MD  07/25/2016 9:15 AM    [facility] [address] [place], [place], [place]  [address] Phone: [phone]; Fax: [phone]

## 2016-07-26 ENCOUNTER — Telehealth: Payer: Self-pay | Admitting: *Deleted

## 2016-07-26 ENCOUNTER — Encounter: Payer: Self-pay | Admitting: Cardiovascular Disease

## 2016-07-26 DIAGNOSIS — M353 Polymyalgia rheumatica: Secondary | ICD-10-CM | POA: Diagnosis not present

## 2016-07-26 DIAGNOSIS — J111 Influenza due to unidentified influenza virus with other respiratory manifestations: Secondary | ICD-10-CM | POA: Diagnosis not present

## 2016-07-26 DIAGNOSIS — Z79899 Other long term (current) drug therapy: Secondary | ICD-10-CM | POA: Diagnosis not present

## 2016-07-26 DIAGNOSIS — E611 Iron deficiency: Secondary | ICD-10-CM | POA: Diagnosis not present

## 2016-07-26 DIAGNOSIS — R42 Dizziness and giddiness: Secondary | ICD-10-CM | POA: Diagnosis not present

## 2016-07-26 DIAGNOSIS — I10 Essential (primary) hypertension: Secondary | ICD-10-CM | POA: Diagnosis not present

## 2016-07-26 NOTE — Telephone Encounter (Signed)
eliquis dose is 2.5 mg twice daily, chart up dated.

## 2016-07-29 LAB — CUP PACEART INCLINIC DEVICE CHECK
Implantable Lead Implant Date: 19920213
Implantable Lead Location: 753860
Implantable Pulse Generator Implant Date: 20101008
MDC IDC LEAD IMPLANT DT: 19920213
MDC IDC LEAD LOCATION: 753859
MDC IDC LEAD SERIAL: 23000302
MDC IDC SESS DTM: 20180209095219
Pulse Gen Model: 2110
Pulse Gen Serial Number: 2314326

## 2016-08-16 DIAGNOSIS — Z08 Encounter for follow-up examination after completed treatment for malignant neoplasm: Secondary | ICD-10-CM | POA: Diagnosis not present

## 2016-08-16 DIAGNOSIS — Z85828 Personal history of other malignant neoplasm of skin: Secondary | ICD-10-CM | POA: Diagnosis not present

## 2016-08-16 DIAGNOSIS — L820 Inflamed seborrheic keratosis: Secondary | ICD-10-CM | POA: Diagnosis not present

## 2016-09-13 ENCOUNTER — Other Ambulatory Visit: Payer: Self-pay | Admitting: Cardiovascular Disease

## 2016-09-13 DIAGNOSIS — I10 Essential (primary) hypertension: Secondary | ICD-10-CM | POA: Diagnosis not present

## 2016-09-13 DIAGNOSIS — J449 Chronic obstructive pulmonary disease, unspecified: Secondary | ICD-10-CM | POA: Diagnosis not present

## 2016-09-13 DIAGNOSIS — M353 Polymyalgia rheumatica: Secondary | ICD-10-CM | POA: Diagnosis not present

## 2016-09-13 DIAGNOSIS — I951 Orthostatic hypotension: Secondary | ICD-10-CM | POA: Diagnosis not present

## 2016-09-13 DIAGNOSIS — E611 Iron deficiency: Secondary | ICD-10-CM | POA: Diagnosis not present

## 2016-10-03 ENCOUNTER — Telehealth: Payer: Self-pay | Admitting: Cardiovascular Disease

## 2016-10-03 ENCOUNTER — Ambulatory Visit (INDEPENDENT_AMBULATORY_CARE_PROVIDER_SITE_OTHER): Payer: Medicare Other | Admitting: *Deleted

## 2016-10-03 DIAGNOSIS — I495 Sick sinus syndrome: Secondary | ICD-10-CM | POA: Diagnosis not present

## 2016-10-03 NOTE — Telephone Encounter (Signed)
New message      Pt has a pacemaker.  She states that she fell out of bed last Monday. Since the fall, she has not felt well.  Do you think she needs to have her pacemaker checked?

## 2016-10-03 NOTE — Telephone Encounter (Signed)
Spoke with pt regarding fall from 4/9, pt stated there was no bruising around the site, but her arm was sore. Informed pt that she could send in a remote transmission to check the pacemaker out, pt stated that the transmitter is at the daughters house and she would not be able to send a transmission until this evening when her daughter gets home. Informed pt that she would receive a call back when transmission was reviewed. Encouraged pt to follow up with primary care provider regarding arm soreness.

## 2016-10-05 ENCOUNTER — Inpatient Hospital Stay (HOSPITAL_COMMUNITY)
Admission: EM | Admit: 2016-10-05 | Discharge: 2016-10-10 | DRG: 166 | Disposition: A | Discharge status: Home / Self Care | Payer: Medicare Other | Attending: Internal Medicine | Admitting: Internal Medicine

## 2016-10-05 ENCOUNTER — Emergency Department (HOSPITAL_COMMUNITY): Payer: Medicare Other

## 2016-10-05 ENCOUNTER — Encounter (HOSPITAL_COMMUNITY): Payer: Self-pay | Admitting: Emergency Medicine

## 2016-10-05 DIAGNOSIS — M353 Polymyalgia rheumatica: Secondary | ICD-10-CM | POA: Diagnosis present

## 2016-10-05 DIAGNOSIS — I10 Essential (primary) hypertension: Secondary | ICD-10-CM | POA: Diagnosis not present

## 2016-10-05 DIAGNOSIS — Z79899 Other long term (current) drug therapy: Secondary | ICD-10-CM

## 2016-10-05 DIAGNOSIS — Z950 Presence of cardiac pacemaker: Secondary | ICD-10-CM | POA: Diagnosis not present

## 2016-10-05 DIAGNOSIS — W06XXXD Fall from bed, subsequent encounter: Secondary | ICD-10-CM | POA: Diagnosis present

## 2016-10-05 DIAGNOSIS — J9601 Acute respiratory failure with hypoxia: Principal | ICD-10-CM | POA: Diagnosis present

## 2016-10-05 DIAGNOSIS — Z7901 Long term (current) use of anticoagulants: Secondary | ICD-10-CM | POA: Diagnosis not present

## 2016-10-05 DIAGNOSIS — R0602 Shortness of breath: Secondary | ICD-10-CM | POA: Diagnosis not present

## 2016-10-05 DIAGNOSIS — J440 Chronic obstructive pulmonary disease with acute lower respiratory infection: Secondary | ICD-10-CM | POA: Diagnosis present

## 2016-10-05 DIAGNOSIS — I5032 Chronic diastolic (congestive) heart failure: Secondary | ICD-10-CM | POA: Diagnosis not present

## 2016-10-05 DIAGNOSIS — I951 Orthostatic hypotension: Secondary | ICD-10-CM | POA: Diagnosis present

## 2016-10-05 DIAGNOSIS — J189 Pneumonia, unspecified organism: Secondary | ICD-10-CM | POA: Diagnosis present

## 2016-10-05 DIAGNOSIS — J441 Chronic obstructive pulmonary disease with (acute) exacerbation: Secondary | ICD-10-CM | POA: Diagnosis present

## 2016-10-05 DIAGNOSIS — F17200 Nicotine dependence, unspecified, uncomplicated: Secondary | ICD-10-CM | POA: Diagnosis not present

## 2016-10-05 DIAGNOSIS — Z9889 Other specified postprocedural states: Secondary | ICD-10-CM

## 2016-10-05 DIAGNOSIS — I110 Hypertensive heart disease with heart failure: Secondary | ICD-10-CM | POA: Diagnosis present

## 2016-10-05 DIAGNOSIS — T17990A Other foreign object in respiratory tract, part unspecified in causing asphyxiation, initial encounter: Secondary | ICD-10-CM | POA: Diagnosis present

## 2016-10-05 DIAGNOSIS — S42301D Unspecified fracture of shaft of humerus, right arm, subsequent encounter for fracture with routine healing: Secondary | ICD-10-CM | POA: Diagnosis not present

## 2016-10-05 DIAGNOSIS — J398 Other specified diseases of upper respiratory tract: Secondary | ICD-10-CM | POA: Diagnosis present

## 2016-10-05 DIAGNOSIS — R404 Transient alteration of awareness: Secondary | ICD-10-CM | POA: Diagnosis not present

## 2016-10-05 DIAGNOSIS — R069 Unspecified abnormalities of breathing: Secondary | ICD-10-CM | POA: Diagnosis not present

## 2016-10-05 DIAGNOSIS — R7989 Other specified abnormal findings of blood chemistry: Secondary | ICD-10-CM | POA: Diagnosis not present

## 2016-10-05 DIAGNOSIS — I5033 Acute on chronic diastolic (congestive) heart failure: Secondary | ICD-10-CM | POA: Diagnosis present

## 2016-10-05 DIAGNOSIS — R0902 Hypoxemia: Secondary | ICD-10-CM

## 2016-10-05 DIAGNOSIS — I495 Sick sinus syndrome: Secondary | ICD-10-CM | POA: Diagnosis not present

## 2016-10-05 DIAGNOSIS — R55 Syncope and collapse: Secondary | ICD-10-CM | POA: Diagnosis not present

## 2016-10-05 DIAGNOSIS — J181 Lobar pneumonia, unspecified organism: Secondary | ICD-10-CM

## 2016-10-05 DIAGNOSIS — S2241XD Multiple fractures of ribs, right side, subsequent encounter for fracture with routine healing: Secondary | ICD-10-CM

## 2016-10-05 DIAGNOSIS — I5030 Unspecified diastolic (congestive) heart failure: Secondary | ICD-10-CM | POA: Diagnosis present

## 2016-10-05 DIAGNOSIS — J90 Pleural effusion, not elsewhere classified: Secondary | ICD-10-CM | POA: Diagnosis not present

## 2016-10-05 DIAGNOSIS — I480 Paroxysmal atrial fibrillation: Secondary | ICD-10-CM | POA: Diagnosis present

## 2016-10-05 DIAGNOSIS — S2241XA Multiple fractures of ribs, right side, initial encounter for closed fracture: Secondary | ICD-10-CM | POA: Diagnosis not present

## 2016-10-05 DIAGNOSIS — R05 Cough: Secondary | ICD-10-CM | POA: Diagnosis not present

## 2016-10-05 DIAGNOSIS — J438 Other emphysema: Secondary | ICD-10-CM | POA: Diagnosis not present

## 2016-10-05 DIAGNOSIS — J449 Chronic obstructive pulmonary disease, unspecified: Secondary | ICD-10-CM | POA: Diagnosis present

## 2016-10-05 DIAGNOSIS — R42 Dizziness and giddiness: Secondary | ICD-10-CM | POA: Diagnosis not present

## 2016-10-05 DIAGNOSIS — R918 Other nonspecific abnormal finding of lung field: Secondary | ICD-10-CM | POA: Diagnosis not present

## 2016-10-05 LAB — BRAIN NATRIURETIC PEPTIDE: B NATRIURETIC PEPTIDE 5: 544.3 pg/mL — AB (ref 0.0–100.0)

## 2016-10-05 LAB — COMPREHENSIVE METABOLIC PANEL
ALK PHOS: 62 U/L (ref 38–126)
ALT: 10 U/L — ABNORMAL LOW (ref 14–54)
AST: 19 U/L (ref 15–41)
Albumin: 3.2 g/dL — ABNORMAL LOW (ref 3.5–5.0)
Anion gap: 9 (ref 5–15)
BILIRUBIN TOTAL: 0.8 mg/dL (ref 0.3–1.2)
BUN: 16 mg/dL (ref 6–20)
CALCIUM: 8.7 mg/dL — AB (ref 8.9–10.3)
CO2: 28 mmol/L (ref 22–32)
CREATININE: 0.79 mg/dL (ref 0.44–1.00)
Chloride: 104 mmol/L (ref 101–111)
Glucose, Bld: 121 mg/dL — ABNORMAL HIGH (ref 65–99)
Potassium: 3.7 mmol/L (ref 3.5–5.1)
Sodium: 141 mmol/L (ref 135–145)
TOTAL PROTEIN: 6.3 g/dL — AB (ref 6.5–8.1)

## 2016-10-05 LAB — CBC WITH DIFFERENTIAL/PLATELET
BASOS ABS: 0 10*3/uL (ref 0.0–0.1)
BASOS PCT: 0 %
EOS ABS: 0.1 10*3/uL (ref 0.0–0.7)
Eosinophils Relative: 1 %
HCT: 40.4 % (ref 36.0–46.0)
HEMOGLOBIN: 12.4 g/dL (ref 12.0–15.0)
Lymphocytes Relative: 8 %
Lymphs Abs: 1.1 10*3/uL (ref 0.7–4.0)
MCH: 27.2 pg (ref 26.0–34.0)
MCHC: 30.7 g/dL (ref 30.0–36.0)
MCV: 88.6 fL (ref 78.0–100.0)
MONOS PCT: 8 %
Monocytes Absolute: 1.1 10*3/uL — ABNORMAL HIGH (ref 0.1–1.0)
NEUTROS PCT: 83 %
Neutro Abs: 12 10*3/uL — ABNORMAL HIGH (ref 1.7–7.7)
Platelets: 344 10*3/uL (ref 150–400)
RBC: 4.56 MIL/uL (ref 3.87–5.11)
RDW: 18.2 % — ABNORMAL HIGH (ref 11.5–15.5)
WBC: 14.4 10*3/uL — AB (ref 4.0–10.5)

## 2016-10-05 LAB — TROPONIN I: TROPONIN I: 0.03 ng/mL — AB (ref <0.03)

## 2016-10-05 MED ORDER — METOPROLOL SUCCINATE ER 25 MG PO TB24
12.5000 mg | ORAL_TABLET | Freq: Every day | ORAL | Status: DC
  Administered 2016-10-06 – 2016-10-09 (×4): 12.5 mg via ORAL
  Filled 2016-10-05 (×4): qty 1

## 2016-10-05 MED ORDER — ACETAMINOPHEN 325 MG PO TABS
650.0000 mg | ORAL_TABLET | Freq: Four times a day (QID) | ORAL | Status: DC | PRN
  Administered 2016-10-06 – 2016-10-09 (×2): 650 mg via ORAL
  Filled 2016-10-05 (×2): qty 2

## 2016-10-05 MED ORDER — IPRATROPIUM-ALBUTEROL 0.5-2.5 (3) MG/3ML IN SOLN
3.0000 mL | Freq: Four times a day (QID) | RESPIRATORY_TRACT | Status: DC
  Administered 2016-10-05 (×2): 3 mL via RESPIRATORY_TRACT
  Filled 2016-10-05: qty 3

## 2016-10-05 MED ORDER — MIRTAZAPINE 15 MG PO TABS
7.5000 mg | ORAL_TABLET | Freq: Every day | ORAL | Status: DC
  Administered 2016-10-05 – 2016-10-09 (×5): 7.5 mg via ORAL
  Filled 2016-10-05 (×5): qty 1

## 2016-10-05 MED ORDER — FERROUS SULFATE 325 (65 FE) MG PO TABS
325.0000 mg | ORAL_TABLET | Freq: Every day | ORAL | Status: DC
  Administered 2016-10-06 – 2016-10-10 (×4): 325 mg via ORAL
  Filled 2016-10-05 (×5): qty 1

## 2016-10-05 MED ORDER — SODIUM CHLORIDE 0.9 % IV SOLN: 250.0000 mL | INTRAVENOUS | Status: DC | PRN

## 2016-10-05 MED ORDER — GUAIFENESIN ER 600 MG PO TB12
600.0000 mg | ORAL_TABLET | Freq: Two times a day (BID) | ORAL | Status: DC
  Administered 2016-10-05 – 2016-10-10 (×9): 600 mg via ORAL
  Filled 2016-10-05 (×9): qty 1

## 2016-10-05 MED ORDER — DEXTROSE 5 % IV SOLN
1.0000 g | Freq: Once | INTRAVENOUS | Status: AC
  Administered 2016-10-05: 1 g via INTRAVENOUS
  Filled 2016-10-05: qty 10

## 2016-10-05 MED ORDER — IPRATROPIUM-ALBUTEROL 0.5-2.5 (3) MG/3ML IN SOLN
3.0000 mL | Freq: Four times a day (QID) | RESPIRATORY_TRACT | Status: DC
  Administered 2016-10-06 – 2016-10-08 (×7): 3 mL via RESPIRATORY_TRACT
  Filled 2016-10-05 (×6): qty 3

## 2016-10-05 MED ORDER — DEXTROSE 5 % IV SOLN
1.0000 g | INTRAVENOUS | Status: DC
  Administered 2016-10-06 – 2016-10-09 (×4): 1 g via INTRAVENOUS
  Filled 2016-10-05 (×4): qty 10

## 2016-10-05 MED ORDER — VITAMIN D3 25 MCG (1000 UNIT) PO TABS
1000.0000 [IU] | ORAL_TABLET | Freq: Two times a day (BID) | ORAL | Status: DC
  Administered 2016-10-05 – 2016-10-10 (×9): 1000 [IU] via ORAL
  Filled 2016-10-05 (×9): qty 1

## 2016-10-05 MED ORDER — MIDODRINE HCL 2.5 MG PO TABS
2.5000 mg | ORAL_TABLET | ORAL | Status: DC | PRN
  Filled 2016-10-05: qty 1

## 2016-10-05 MED ORDER — ONDANSETRON HCL 4 MG/2ML IJ SOLN: 4.0000 mg | Freq: Four times a day (QID) | INTRAMUSCULAR | Status: DC | PRN

## 2016-10-05 MED ORDER — SODIUM CHLORIDE 0.9% FLUSH
3.0000 mL | Freq: Two times a day (BID) | INTRAVENOUS | Status: DC
  Administered 2016-10-05 – 2016-10-10 (×7): 3 mL via INTRAVENOUS

## 2016-10-05 MED ORDER — AZITHROMYCIN 250 MG PO TABS
500.0000 mg | ORAL_TABLET | Freq: Every day | ORAL | Status: DC
  Administered 2016-10-06 – 2016-10-10 (×4): 500 mg via ORAL
  Filled 2016-10-05 (×4): qty 2

## 2016-10-05 MED ORDER — METOPROLOL SUCCINATE ER 25 MG PO TB24
25.0000 mg | ORAL_TABLET | Freq: Every day | ORAL | Status: DC
Start: 2016-10-05 — End: 2016-10-10
  Administered 2016-10-06 – 2016-10-10 (×4): 25 mg via ORAL
  Filled 2016-10-05 (×4): qty 1

## 2016-10-05 MED ORDER — SODIUM CHLORIDE 0.9% FLUSH
3.0000 mL | INTRAVENOUS | Status: DC | PRN
  Administered 2016-10-07: 3 mL via INTRAVENOUS
  Filled 2016-10-05: qty 3

## 2016-10-05 MED ORDER — ONDANSETRON HCL 4 MG PO TABS: 4.0000 mg | ORAL_TABLET | Freq: Four times a day (QID) | ORAL | Status: DC | PRN

## 2016-10-05 MED ORDER — IPRATROPIUM-ALBUTEROL 0.5-2.5 (3) MG/3ML IN SOLN: 3.0000 mL | Freq: Four times a day (QID) | RESPIRATORY_TRACT | Status: DC

## 2016-10-05 MED ORDER — METHYLPREDNISOLONE SODIUM SUCC 40 MG IJ SOLR
40.0000 mg | Freq: Four times a day (QID) | INTRAMUSCULAR | Status: DC
  Administered 2016-10-05 (×2): 40 mg via INTRAVENOUS
  Filled 2016-10-05 (×2): qty 1

## 2016-10-05 MED ORDER — APIXABAN 2.5 MG PO TABS
2.5000 mg | ORAL_TABLET | Freq: Two times a day (BID) | ORAL | Status: DC
  Administered 2016-10-05 – 2016-10-06 (×2): 2.5 mg via ORAL
  Filled 2016-10-05 (×2): qty 1

## 2016-10-05 MED ORDER — ACETAMINOPHEN 650 MG RE SUPP: 650.0000 mg | Freq: Four times a day (QID) | RECTAL | Status: DC | PRN

## 2016-10-05 MED ORDER — LEVALBUTEROL HCL 0.63 MG/3ML IN NEBU: 0.6300 mg | INHALATION_SOLUTION | Freq: Four times a day (QID) | RESPIRATORY_TRACT | Status: DC | PRN

## 2016-10-05 MED ORDER — PREDNISONE 20 MG PO TABS: 10.0000 mg | ORAL_TABLET | ORAL | Status: DC

## 2016-10-05 MED ORDER — ALPRAZOLAM 0.25 MG PO TABS
0.2500 mg | ORAL_TABLET | Freq: Every day | ORAL | Status: DC
  Administered 2016-10-05 – 2016-10-09 (×5): 0.25 mg via ORAL
  Filled 2016-10-05 (×5): qty 1

## 2016-10-05 MED ORDER — TRAMADOL HCL 50 MG PO TABS
50.0000 mg | ORAL_TABLET | Freq: Four times a day (QID) | ORAL | Status: DC | PRN
  Administered 2016-10-05: 50 mg via ORAL
  Filled 2016-10-05: qty 1

## 2016-10-05 MED ORDER — METOPROLOL SUCCINATE ER 25 MG PO TB24: 312.5000 mg | ORAL_TABLET | Freq: Two times a day (BID) | ORAL | Status: DC

## 2016-10-05 MED ORDER — ALBUTEROL SULFATE (2.5 MG/3ML) 0.083% IN NEBU: 5.0000 mg | INHALATION_SOLUTION | Freq: Once | RESPIRATORY_TRACT | Status: DC

## 2016-10-05 MED ORDER — ESCITALOPRAM OXALATE 10 MG PO TABS
10.0000 mg | ORAL_TABLET | Freq: Every day | ORAL | Status: DC
  Administered 2016-10-06 – 2016-10-10 (×4): 10 mg via ORAL
  Filled 2016-10-05 (×4): qty 1

## 2016-10-05 MED ORDER — DEXTROSE 5 % IV SOLN
500.0000 mg | Freq: Once | INTRAVENOUS | Status: AC
  Administered 2016-10-05: 500 mg via INTRAVENOUS
  Filled 2016-10-05: qty 500

## 2016-10-05 NOTE — H&P (Addendum)
History and Physical    [name] IWP:[id number] DOB: May 19, 1931 DOA: 10/05/2016    PCP: Mathews Argyle, MD  Patient coming from: home  Chief Complaint: cough and shortness of breath- right chest pain   HPI: [name] is a 83 y.o. female with medical history of COPD, admitted on 1/18 for Influenza and pneumonia, PMR on steroids, chronic dCHF, PAF on Eliquis, s/p pacer for SSS, who presents essentially for shortness of breath and worsening "croupy cough". Her daughter gives some of the history. She states her mother has a chronic cough but it sounds more congested now as it does when she has a COPD or CHF flare. The patient is not coughing much up. No fever. She has been having twinges of pain in her right posterior chest and flank and is found to have rib fractures. She fell off a bed in a hotel about 9 days ago when she was trying to answer the phone in the dark. She has not really had much pain. She did not seek medical care. Her daughter feels that her mother has been a little unsteady on her feet for about a week now. The patient told her doctor at she has been using her cane more.   ED Course:  BP 175/94, Pulse ox  WBC 14.4, Troponin 0.03, BNP 544.3 Fractures of third through seventh ribs- RML and RLL opacities  Review of Systems:  Easy bruising  All other systems reviewed and apart from HPI, are negative.  Past Medical History:  Diagnosis Date  . CHB (complete heart block) ([place]) 07/13/2013   Pacemaker dependent  . CHF (congestive heart failure) ([place])   . COPD (chronic obstructive pulmonary disease) ([place])   . DM (dermatomyositis)   . Orthostatic hypotension   . Pacemaker 07/13/2013   Her original pacemaker and the current leads were implanted in 1992. She has had 2 generator change out, most recently in 2010. Her device is a Buyer, retail 2110 non RF dual-chamber pacemaker with a battery longevity estimated at about 7 years. The atrial lead is a St. Jude [id number] and  the ventricular lead was a Biotronik [id number].   [name] Paroxysmal atrial fibrillation ([place]) 09/04/2014  . Scoliosis   . SSS (sick sinus syndrome) ([place]) 07/13/2013  . Syncope   . Systemic hypertension   . Thoracic kyphosis     Past Surgical History:  Procedure Laterality Date  . NM MYOCAR PERF WALL MOTION  09/13/2011   Low risk  . PERMANENT PACEMAKER GENERATOR CHANGE  03/27/2009   [employer].Jude  . US ECHOCARDIOGRAPHY  03/28/2012   Mod LAE,mild MR,aortic sclerosis w/mod AI,mod. TR,mild PI,Stage I diastolic dysfunction    Social History:  Life long smoker. Trys to avoid my question and states she does not smoke everyday.  She has never used smokeless tobacco. She reports that she does not drink alcohol or use drugs.  Lives alone, uses a Cane to walk.   No Known Allergies  No family history on file.   Prior to Admission medications   Medication Sig Start Date End Date Taking? Authorizing Provider  ALPRAZolam (XANAX) 0.25 MG tablet Take 0.25 mg by mouth at bedtime.    Yes Historical Provider, MD  cholecalciferol (VITAMIN D) 1000 units tablet Take 1,000 Units by mouth 2 (two) times daily.   Yes Historical Provider, MD  ELIQUIS 2.5 MG TABS tablet TAKE 1 TABLET(2.5 MG) BY MOUTH TWICE DAILY 09/13/16  Yes Mihai Croitoru, MD  escitalopram (LEXAPRO) 10 MG tablet Take  10 mg by mouth daily.   Yes Historical Provider, MD  furosemide (LASIX) 40 MG tablet Take 40 mg by mouth daily as needed (for weight gain of greater than 2lbs).   Yes Historical Provider, MD  ipratropium (ATROVENT) 0.06 % nasal spray Place 2 sprays into both nostrils 4 (four) times daily as needed for rhinitis.   Yes Historical Provider, MD  Ipratropium-Albuterol (COMBIVENT RESPIMAT) 20-100 MCG/ACT AERS respimat Inhale 1 puff into the lungs every 6 (six) hours as needed for wheezing or shortness of breath. 08/29/15  Yes Barton Dubois, MD  metoprolol succinate (TOPROL-XL) 25 MG 24 hr tablet Take 12.5-25 tablets by mouth 2 (two) times daily. Pt  takes one tablet in the morning and one-half tablet at bedtime.   Yes Historical Provider, MD  midodrine (PROAMATINE) 2.5 MG tablet Take 2.5 mg by mouth as needed (when systolic BP is lower than 100).    Yes Historical Provider, MD  mirtazapine (REMERON) 15 MG tablet Take 7.5 mg by mouth at bedtime.    Yes Historical Provider, MD  potassium chloride SA (K-DUR,KLOR-CON) 20 MEQ tablet Take 20 mEq by mouth daily as needed (when taking Lasix).    Yes Historical Provider, MD  predniSONE (DELTASONE) 10 MG tablet Take 10-15 mg by mouth daily. Takes 1 tablet one day and 1 and 1/2 the next day.Last dose was 15 mg   Yes Historical Provider, MD  alendronate (FOSAMAX) 70 MG tablet Take 70 mg by mouth every Saturday. Take with a full glass of water on an empty stomach.    Historical Provider, MD  azithromycin (ZITHROMAX) 250 MG tablet Take 1 pill a day Patient not taking: Reported on 10/05/2016 07/19/16   Doreatha Lew, MD    Physical Exam: Vitals:   10/05/16 0859 10/05/16 0903 10/05/16 0935 10/05/16 1212  BP: (!) 175/94  (!) 176/74 (!) 133/56  Pulse: 77  71 71  Resp: (!) 22  (!) 37 (!) 33  Temp: 98.2 F (36.8 C)     TempSrc: Oral     SpO2: 90% 92% 92% 99%      Constitutional: NAD, calm, comfortable Eyes: PERTLA, lids and conjunctivae normal ENMT: Mucous membranes are moist. Posterior pharynx clear of any exudate or lesions. Normal dentition.  Neck: normal, supple, no masses, no thyromegaly Respiratory: + chest congestion/ rhonchi. no wheezing, no crackles. Normal respiratory effort. No accessory muscle use.  Cardiovascular: S1 & S2 heard, regular rate and rhythm, no murmurs / rubs / gallops. No extremity edema. 2+ pedal pulses. No carotid bruits.  Abdomen: No distension, no tenderness, no masses palpated. No hepatosplenomegaly. Bowel sounds normal.  Musculoskeletal: no clubbing / cyanosis. No joint deformity upper and lower extremities. Good ROM, no contractures. Normal muscle tone.  Skin: no  rashes, lesions, ulcers. + bruising No induration Neurologic: CN 2-12 grossly intact. Sensation intact, DTR normal. Strength 5/5 in all 4 limbs.  Psychiatric: Normal judgment and insight. Alert and oriented x 3. Normal mood.     Labs on Admission: I have personally reviewed following labs and imaging studies  CBC:  Recent Labs Lab 10/05/16 0929  WBC 14.4*  NEUTROABS 12.0*  HGB 12.4  HCT 40.4  MCV 88.6  PLT 220   Basic Metabolic Panel:  Recent Labs Lab 10/05/16 0929  NA 141  K 3.7  CL 104  CO2 28  GLUCOSE 121*  BUN 16  CREATININE 0.79  CALCIUM 8.7*   GFR: CrCl cannot be calculated (Unknown ideal weight.). Liver Function Tests:  Recent  Labs Lab 10/05/16 0929  AST 19  ALT 10*  ALKPHOS 62  BILITOT 0.8  PROT 6.3*  ALBUMIN 3.2*   No results for input(s): LIPASE, AMYLASE in the last 168 hours. No results for input(s): AMMONIA in the last 168 hours. Coagulation Profile: No results for input(s): INR, PROTIME in the last 168 hours. Cardiac Enzymes:  Recent Labs Lab 10/05/16 0929  TROPONINI 0.03*   BNP (last 3 results) No results for input(s): PROBNP in the last 8760 hours. HbA1C: No results for input(s): HGBA1C in the last 72 hours. CBG: No results for input(s): GLUCAP in the last 168 hours. Lipid Profile: No results for input(s): CHOL, HDL, LDLCALC, TRIG, CHOLHDL, LDLDIRECT in the last 72 hours. Thyroid Function Tests: No results for input(s): TSH, T4TOTAL, FREET4, T3FREE, THYROIDAB in the last 72 hours. Anemia Panel: No results for input(s): VITAMINB12, FOLATE, FERRITIN, TIBC, IRON, RETICCTPCT in the last 72 hours. Urine analysis:    Component Value Date/Time   COLORURINE YELLOW 06/07/2015 0254   APPEARANCEUR CLOUDY (A) 06/07/2015 0254   LABSPEC 1.014 06/07/2015 0254   PHURINE 6.0 06/07/2015 0254   GLUCOSEU 500 (A) 06/07/2015 0254   HGBUR LARGE (A) 06/07/2015 0254   BILIRUBINUR NEGATIVE 06/07/2015 0254   KETONESUR NEGATIVE 06/07/2015 0254    PROTEINUR NEGATIVE 06/07/2015 0254   UROBILINOGEN 1.0 01/19/2011 1030   NITRITE NEGATIVE 06/07/2015 0254   LEUKOCYTESUR NEGATIVE 06/07/2015 0254   Sepsis Labs: @LABRCNTIP (procalcitonin:4,lacticidven:4) )No results found for this or any previous visit (from the past 240 hour(s)).   Radiological Exams on Admission: Dg Chest 2 View  Result Date: 10/05/2016 CLINICAL DATA:  Increased shortness of breath. Cough, congestion over the last several days. EXAM: CHEST  2 VIEW COMPARISON:  07/16/2016 FINDINGS: The lungs are hyperinflated likely secondary to COPD. There is a small right pleural effusion. There is right lower lobe airspace disease. The left lung is clear. There is no pneumothorax. There is stable cardiomegaly. There is mild bilateral interstitial prominence. There is a dual lead cardiac pacemaker. The osseous structures are unremarkable. IMPRESSION: 1. Right lower lobe pneumonia. Small right pleural effusion. Followup PA and lateral chest X-ray is recommended in 3-4 weeks following trial of antibiotic therapy to ensure resolution and exclude underlying malignancy. 2. Cardiomegaly with mild pulmonary vascular congestion. Electronically Signed   By: Kathreen Devoid   On: 10/05/2016 09:18   Ct Chest Wo Contrast  Addendum Date: 10/05/2016   ADDENDUM REPORT: 10/05/2016 11:10 ADDENDUM: Study discussed by telephone with Dr. Carmin Muskrat on 10/05/2016 at 1101 hours. Electronically Signed   By: Genevie Ann M.D.   On: 10/05/2016 11:10   Result Date: 10/05/2016 CLINICAL DATA:  83 year old female status post fall 1 week ago with right side chest and rib pain. Abnormal chest radiographs today. EXAM: CT CHEST WITHOUT CONTRAST TECHNIQUE: Multidetector CT imaging of the chest was performed following the standard protocol without IV contrast. COMPARISON:  Chest radiographs 0908 hours today. Lumbar spine CT 05/24/2005 FINDINGS: Cardiovascular: Calcified aorta and coronary artery atherosclerosis. Cardiomegaly. No  pericardial effusion. Right chest cardiac pacemaker or AICD. Vascular patency is not evaluated in the absence of IV contrast. Mediastinum/Nodes: 2 cm subcarinal lymph node, favor reactive (See lung findings below). Other mediastinal lymph nodes are within normal limits. Difficult to delineate any hilar lymph nodes in the absence of IV contrast. Mild gaseous distension of the thoracic esophagus appears inconsequential. Lungs/Pleura: Layering right pleural effusion up to 2.5 cm in thickness with simple fluid density. Dependent retained secretions in the right  bronchus intermedius (series 5, image 83). The right middle lobe bronchus is occluded on image 98. The right middle lobe bronchus is also occluded. Other Major airways are patent.  No pneumothorax. Abnormal right lower lobe confluent peribronchial opacity extending from the hilum. Extensive more peripheral right lower lobe nodularity and irregular opacity. In the basilar segments of the right lower lobe there is peribronchial consolidation and confluent irregular opacity. Questionable small areas of cavitation within the distal right lower lobe versus bronchiectasis (series 5, image 123). Superimposed confluent right middle lobe atelectasis. Both upper lobes demonstrates smooth septal thickening. There is superimposed peripheral right upper lobe subpleural reticular opacity. See also right rib findings below. Trace layering left pleural effusion. Mild mostly dependent opacity in the left lower lobe. Upper Abdomen: Multiple nonspecific hypodense lesions in the visible liver, the largest measuring about 3 cm diameter do have simple fluid density. Negative noncontrast visible spleen, pancreas, adrenal glands, kidneys and bowel in the upper abdomen. Musculoskeletal: Osteopenia. Acute fractures of the right lateral third through seventh ribs. These fractures are nondisplaced except at the third rib on series 5, image 50 where there is mild adjacent intercostal and  pleural or extrapleural hematoma. Sternum intact. No vertebral fracture identified. No other acute fracture or left rib fracture identified. Visible shoulder osseous structures appear grossly intact. IMPRESSION: 1. Acute fractures of the right lateral third through seventh ribs. The third rib fracture is mildly displaced with associated small volume adjacent intercostal and pleural or extrapleural hematoma. 2. Opacified or occluded right middle lobe and lower lobe bronchi with widespread abnormal right lower lobe opacity. The appearance does not suggest pulmonary contusion. Severe aspiration and/or pneumonia is possible but the appearance is suspicious for an obstructing bronchogenic carcinoma. Follow-up bronchoscopy may be most valuable at this point and is recommended. 3. Small layering right pleural effusion. Trace layering left pleural effusion. 4. Increased subcarinal mediastinal lymph node, indeterminate (as in #2). 5. Cardiomegaly. Calcified aortic and coronary artery atherosclerosis. 6. Indeterminate but favor benign multiple low-density lesions in the liver. Electronically Signed: By: Genevie Ann M.D. On: 10/05/2016 10:55    EKG: Independently reviewed. V paced rhythm  Assessment/Plan Principal Problem:   Acute hypoxemic respiratory failure - appears to have RML and RLL opacities possible endobronchial lestion with worsening congestion and dyspnea - Pulm has evaluated her and will perform a bronch on Friday - treat for pneumonia which is likely due to recent rib fractures with Rocephin and Zithromax - O2 as needed- on 3 L at this time - Nebs Q 6 hrs and PRN - Mucinex  Active Problems:  Pneumonia/ leukocytosis - as above     COPD (chronic obstructive pulmonary disease)  - not on home O2- uses Combivent - has been started on Solumedrol by Pulm    Closed multiple fractures of right upper extremity with ribs with routine healing - has not really needed anything for pain- she takes Ultracet  as needed- will place on Tramadol PRN - IS    Orthostatic hypotension - continue Midodrine- check orthostatics    Paroxysmal atrial fibrillation /   Anticoagulant long-term use - cont Eliquis and Metoprolol   Chronic Diastolic CHF (HCC) - grade 1- on lasix and K+ PRN for weight gain which she has not recently needed    Systemic hypertension - Metoprolol    Smoker - states she does not smoke daily now     Pacemaker   SSS (sick sinus syndrome)     DVT prophylaxis: Eliquis Code Status:  Full code  Family Communication: daughter Jill Side Disposition Plan: admit to tele  Consults called: none  Admission status: inpatient    Debbe Odea MD [facility] Pager: [email or web address] Password [email or web address] [phone], please contact night-coverage   10/05/2016, 1:41 PM

## 2016-10-05 NOTE — Consult Note (Addendum)
PULMONARY / CRITICAL CARE MEDICINE   Name: [name] MRN: [id number] DOB: 08-17-1930    ADMISSION DATE:  10/05/2016 CONSULTATION DATE:  10/05/16  REFERRING MD:  Kirby Funk MD  CHIEF COMPLAINT:  Fall, abnormal CT scan.  HISTORY OF PRESENT ILLNESS:   83 year old with history of complete heart block status post pacemaker, diastolic heart failure, paroxysmal A. fib, and prolymyalgia rheumatica, COPD. She had a fall about 9 days ago while on a trip outside [place]. She did not seek medical attention until now and has been lying in bed. Since her fall she's noticed worsening dyspnea, cough, congestion, mucus production, wheezing. CT scan in the ED shows fractured ribs on the right with extensive consolidation, narrowing of the bronchus on the right with question on endobronchial lesion. PCCM called for further evaluation  She has history of COPD and is maintained on inhalers by primary care. She has not had PFTs or a pulmonary eval,  not on home O2. She still continues to smoke  PAST MEDICAL HISTORY :  She  has a past medical history of CHB (complete heart block) ([place]) (07/13/2013); CHF (congestive heart failure) ([place]); COPD (chronic obstructive pulmonary disease) ([place]); DM (dermatomyositis); Orthostatic hypotension; Pacemaker (07/13/2013); Paroxysmal atrial fibrillation ([place]) (09/04/2014); Scoliosis; SSS (sick sinus syndrome) ([place]) (07/13/2013); Syncope; Systemic hypertension; and Thoracic kyphosis.  PAST SURGICAL HISTORY: She  has a past surgical history that includes Permanent pacemaker generator change (03/27/2009); NM MYOCAR PERF WALL MOTION (09/13/2011); and US ECHOCARDIOGRAPHY (03/28/2012).  No Known Allergies  No current facility-administered medications on file prior to encounter.    Current Outpatient Prescriptions on File Prior to Encounter  Medication Sig  . ALPRAZolam (XANAX) 0.25 MG tablet Take 0.25 mg by mouth at bedtime.   . cholecalciferol (VITAMIN D) 1000 units tablet Take  1,000 Units by mouth 2 (two) times daily.  [name] ELIQUIS 2.5 MG TABS tablet TAKE 1 TABLET(2.5 MG) BY MOUTH TWICE DAILY  . escitalopram (LEXAPRO) 10 MG tablet Take 10 mg by mouth daily.  . furosemide (LASIX) 40 MG tablet Take 40 mg by mouth daily as needed (for weight gain of greater than 2lbs).  [name] ipratropium (ATROVENT) 0.06 % nasal spray Place 2 sprays into both nostrils 4 (four) times daily as needed for rhinitis.  . Ipratropium-Albuterol (COMBIVENT RESPIMAT) 20-100 MCG/ACT AERS respimat Inhale 1 puff into the lungs every 6 (six) hours as needed for wheezing or shortness of breath.  . metoprolol succinate (TOPROL-XL) 25 MG 24 hr tablet Take 12.5-25 tablets by mouth 2 (two) times daily. Pt takes one tablet in the morning and one-half tablet at bedtime.  . midodrine (PROAMATINE) 2.5 MG tablet Take 2.5 mg by mouth as needed (when systolic BP is lower than 100).   . mirtazapine (REMERON) 15 MG tablet Take 7.5 mg by mouth at bedtime.   . potassium chloride SA (K-DUR,KLOR-CON) 20 MEQ tablet Take 20 mEq by mouth daily as needed (when taking Lasix).   . predniSONE (DELTASONE) 10 MG tablet Take 10-15 mg by mouth daily. Takes 1 tablet one day and 1 and 1/2 the next day.Last dose was 15 mg  . alendronate (FOSAMAX) 70 MG tablet Take 70 mg by mouth every Saturday. Take with a full glass of water on an empty stomach.  [name] azithromycin (ZITHROMAX) 250 MG tablet Take 1 pill a day (Patient not taking: Reported on 10/05/2016)    FAMILY HISTORY:  Her indicated that her mother is deceased. She indicated that her father is deceased.    SOCIAL  HISTORY: She  reports that she has never smoked. She has never used smokeless tobacco. She reports that she does not drink alcohol or use drugs.  REVIEW OF SYSTEMS:    SUBJECTIVE:   VITAL SIGNS: BP (!) 133/56   Pulse 71   Temp 98.2 F (36.8 C) (Oral)   Resp (!) 33   SpO2 99%   HEMODYNAMICS:    VENTILATOR SETTINGS:    INTAKE / OUTPUT: No intake/output data  recorded.  PHYSICAL EXAMINATION: General:  Awake, no distress Neuro:  No focal deficits HEENT:  No thyromegaly, JVD Cardiovascular:  Regular rate and rhythm, paced Lungs:  Bilateral expiratory wheeze, crackles Abdomen:  Soft, positive bowel sounds Musculoskeletal:  Normal tone and bulk Skin:  Intact  LABS:  BMET  Recent Labs Lab 10/05/16 0929  NA 141  K 3.7  CL 104  CO2 28  BUN 16  CREATININE 0.79  GLUCOSE 121*    Electrolytes  Recent Labs Lab 10/05/16 0929  CALCIUM 8.7*    CBC  Recent Labs Lab 10/05/16 0929  WBC 14.4*  HGB 12.4  HCT 40.4  PLT 344    Coag's No results for input(s): APTT, INR in the last 168 hours.  Sepsis Markers No results for input(s): LATICACIDVEN, PROCALCITON, O2SATVEN in the last 168 hours.  ABG No results for input(s): PHART, PCO2ART, PO2ART in the last 168 hours.  Liver Enzymes  Recent Labs Lab 10/05/16 0929  AST 19  ALT 10*  ALKPHOS 62  BILITOT 0.8  ALBUMIN 3.2*    Cardiac Enzymes  Recent Labs Lab 10/05/16 0929  TROPONINI 0.03*    Glucose No results for input(s): GLUCAP in the last 168 hours.  Imaging Dg Chest 2 View  Result Date: 10/05/2016 CLINICAL DATA:  Increased shortness of breath. Cough, congestion over the last several days. EXAM: CHEST  2 VIEW COMPARISON:  07/16/2016 FINDINGS: The lungs are hyperinflated likely secondary to COPD. There is a small right pleural effusion. There is right lower lobe airspace disease. The left lung is clear. There is no pneumothorax. There is stable cardiomegaly. There is mild bilateral interstitial prominence. There is a dual lead cardiac pacemaker. The osseous structures are unremarkable. IMPRESSION: 1. Right lower lobe pneumonia. Small right pleural effusion. Followup PA and lateral chest X-ray is recommended in 3-4 weeks following trial of antibiotic therapy to ensure resolution and exclude underlying malignancy. 2. Cardiomegaly with mild pulmonary vascular congestion.  Electronically Signed   By: Kathreen Devoid   On: 10/05/2016 09:18   Ct Chest Wo Contrast  Addendum Date: 10/05/2016   ADDENDUM REPORT: 10/05/2016 11:10 ADDENDUM: Study discussed by telephone with Dr. Carmin Muskrat on 10/05/2016 at 1101 hours. Electronically Signed   By: Genevie Ann M.D.   On: 10/05/2016 11:10   Result Date: 10/05/2016 CLINICAL DATA:  83 year old female status post fall 1 week ago with right side chest and rib pain. Abnormal chest radiographs today. EXAM: CT CHEST WITHOUT CONTRAST TECHNIQUE: Multidetector CT imaging of the chest was performed following the standard protocol without IV contrast. COMPARISON:  Chest radiographs 0908 hours today. Lumbar spine CT 05/24/2005 FINDINGS: Cardiovascular: Calcified aorta and coronary artery atherosclerosis. Cardiomegaly. No pericardial effusion. Right chest cardiac pacemaker or AICD. Vascular patency is not evaluated in the absence of IV contrast. Mediastinum/Nodes: 2 cm subcarinal lymph node, favor reactive (See lung findings below). Other mediastinal lymph nodes are within normal limits. Difficult to delineate any hilar lymph nodes in the absence of IV contrast. Mild gaseous distension of the thoracic  esophagus appears inconsequential. Lungs/Pleura: Layering right pleural effusion up to 2.5 cm in thickness with simple fluid density. Dependent retained secretions in the right bronchus intermedius (series 5, image 83). The right middle lobe bronchus is occluded on image 98. The right middle lobe bronchus is also occluded. Other Major airways are patent.  No pneumothorax. Abnormal right lower lobe confluent peribronchial opacity extending from the hilum. Extensive more peripheral right lower lobe nodularity and irregular opacity. In the basilar segments of the right lower lobe there is peribronchial consolidation and confluent irregular opacity. Questionable small areas of cavitation within the distal right lower lobe versus bronchiectasis (series 5, image  123). Superimposed confluent right middle lobe atelectasis. Both upper lobes demonstrates smooth septal thickening. There is superimposed peripheral right upper lobe subpleural reticular opacity. See also right rib findings below. Trace layering left pleural effusion. Mild mostly dependent opacity in the left lower lobe. Upper Abdomen: Multiple nonspecific hypodense lesions in the visible liver, the largest measuring about 3 cm diameter do have simple fluid density. Negative noncontrast visible spleen, pancreas, adrenal glands, kidneys and bowel in the upper abdomen. Musculoskeletal: Osteopenia. Acute fractures of the right lateral third through seventh ribs. These fractures are nondisplaced except at the third rib on series 5, image 50 where there is mild adjacent intercostal and pleural or extrapleural hematoma. Sternum intact. No vertebral fracture identified. No other acute fracture or left rib fracture identified. Visible shoulder osseous structures appear grossly intact. IMPRESSION: 1. Acute fractures of the right lateral third through seventh ribs. The third rib fracture is mildly displaced with associated small volume adjacent intercostal and pleural or extrapleural hematoma. 2. Opacified or occluded right middle lobe and lower lobe bronchi with widespread abnormal right lower lobe opacity. The appearance does not suggest pulmonary contusion. Severe aspiration and/or pneumonia is possible but the appearance is suspicious for an obstructing bronchogenic carcinoma. Follow-up bronchoscopy may be most valuable at this point and is recommended. 3. Small layering right pleural effusion. Trace layering left pleural effusion. 4. Increased subcarinal mediastinal lymph node, indeterminate (as in #2). 5. Cardiomegaly. Calcified aortic and coronary artery atherosclerosis. 6. Indeterminate but favor benign multiple low-density lesions in the liver. Electronically Signed: By: Genevie Ann M.D. On: 10/05/2016 10:55      STUDIES:  CT chest 10/05/16 > trace right effusion, multiple rib fractures on the right, extensive consolidative changes and right middle lobe and lower lobe with narrowing of right mainstem bronchus. Subcarinal lymph node I have reviewed the images personally  CULTURES:   ANTIBIOTICS: Ceftriaxone 4/18 > Azithro 4/18 >  SIGNIFICANT EVENTS:   LINES/TUBES:   DISCUSSION: 83 year old with COPD, smoker presenting after a fall with rib fractures, multilobar pneumonia on the right  I suspect that she has developed a pneumonia after a fall due to splinting on the right with inability to clear secretions. She is also wheezing on examination today suggestive of COPD exacerbation. There is narrowing of the right mainstem bronchus which is likely from mucus. I have suggested that we treat her with antibiotics and follow-up with a CT scan but the family are anxious about a possible malignancy and would like this evaluated sooner. The risk benefits of the procedure were discussed with the patient and her daughter and they are agreeable to proceed with the bronch.   Abnormal CT scan ? endobronchial mass - Tentatively scheduled for a bronchoscopy with airway examination on Friday 4/20. - Hold anticoagulation tomorrow  Rt pneumonia, COPD exacerbation - Continue ceftriaxone, azithromycin - Scheduled nebulizers -  Start steroids solumedrol 40 mg every 6 - Incentive spirometer  We will check again on her tomorrow.   More then 1/2 the time of the 80 min visit was spent in discussion and counseling the patient and family. Marshell Garfinkel MD [facility] Pager [phone] If no answer or after 3pm call: [phone] 10/05/2016, 12:39 PM

## 2016-10-05 NOTE — ED Triage Notes (Signed)
Per [facility] pt from home reports shortness of breath, Hx COPD. O2 sat 80 on [place], 97 10 L [place]. denies chest pain. No home O2 per EMS. Pt alert and oriented x 4. Also reports nausea

## 2016-10-05 NOTE — ED Notes (Signed)
Hold admission orders per Dr Wynelle Cleveland, until release by floor RN

## 2016-10-05 NOTE — Progress Notes (Signed)
Pt admitted to floor. Bruise noted to right upper arm due to a recent fall at home. Healing skin tear to left lower leg no drainage black scab noted to area. Skin thin fragile and scattered bruising also noted to other extremities.

## 2016-10-05 NOTE — ED Provider Notes (Signed)
[facility] DEPT Provider Note   CSN: [id number] Arrival date & time: 10/05/16  0855     History   Chief Complaint Chief Complaint  Patient presents with  . Shortness of Breath    HPI [name] is a 83 y.o. female.  HPI  Patient presents with right-sided chest pain, dyspnea, cough. She typically has some degree of dyspnea, acknowledges a history of COPD, CHF. Over the past day the symptoms have become worse. She does recall particular precipitant of [place]. During the trip she had a mechanical fall, falling against a nightstand, striking her right axilla. Since the fall there is been pain persistently in the right anterior inferior axilla, sore, nonradiating, worse with coughing. No report of fever, chills, confusion, disorientation. Patient does not wear oxygen at home. She is here with her daughter who assists with the history of present illness.   Past Medical History:  Diagnosis Date  . CHB (complete heart block) ([place]) 07/13/2013   Pacemaker dependent  . CHF (congestive heart failure) ([place])   . COPD (chronic obstructive pulmonary disease) ([place])   . DM (dermatomyositis)   . Orthostatic hypotension   . Pacemaker 07/13/2013   Her original pacemaker and the current leads were implanted in 1992. She has had 2 generator change out, most recently in 2010. Her device is a Buyer, retail 2110 non RF dual-chamber pacemaker with a battery longevity estimated at about 7 years. The atrial lead is a St. Jude [id number] and the ventricular lead was a Biotronik [id number].   [name] Paroxysmal atrial fibrillation ([place]) 09/04/2014  . Scoliosis   . SSS (sick sinus syndrome) ([place]) 07/13/2013  . Syncope   . Systemic hypertension   . Thoracic kyphosis     Patient Active Problem List   Diagnosis Date Noted  . HCAP (healthcare-associated pneumonia) 10/31/2015  . COPD (chronic obstructive pulmonary disease) ([place]) 10/31/2015  . Leukocytosis 10/31/2015  . Benign  essential HTN 10/31/2015  . Anxiety state   . Acute respiratory failure with hypoxia ([place]) 08/26/2015  . COPD exacerbation ([place]) 08/26/2015  . Diastolic CHF ([place]) 92/42/6834  . Systemic hypertension 08/26/2015  . Hypokalemia 08/26/2015  . Anticoagulant long-term use 08/26/2015  . DM (dermatomyositis) 08/26/2015  . Acute on chronic diastolic CHF (congestive heart failure), NYHA class 1 ([place])   . Sepsis ([place]) 06/07/2015  . CAP (community acquired pneumonia) 06/07/2015  . Acute CHF (congestive heart failure) ([place]) 06/07/2015  . Paroxysmal atrial fibrillation ([place]) 09/04/2014  . Pacemaker 07/13/2013  . CHB (complete heart block) ([place]) 07/13/2013  . SSS (sick sinus syndrome) ([place]) 07/13/2013  . Orthostatic hypotension 07/13/2013  . Aortic insufficiency 07/13/2013    Past Surgical History:  Procedure Laterality Date  . NM MYOCAR PERF WALL MOTION  09/13/2011   Low risk  . PERMANENT PACEMAKER GENERATOR CHANGE  03/27/2009   St.Jude  . US ECHOCARDIOGRAPHY  03/28/2012   Mod LAE,mild MR,aortic sclerosis w/mod AI,mod. TR,mild PI,Stage I diastolic dysfunction    OB History    No data available       Home Medications    Prior to Admission medications   Medication Sig Start Date End Date Taking? Authorizing Provider  alendronate (FOSAMAX) 70 MG tablet Take 70 mg by mouth every Saturday. Take with a full glass of water on an empty stomach.    Historical Provider, MD  ALPRAZolam Duanne Moron) 0.25 MG tablet Take 0.25 mg by mouth at bedtime.     Historical Provider, MD  apixaban (ELIQUIS) 2.5 MG TABS tablet Take 1 tablet (2.5 mg total) by mouth 2 (two) times daily. 07/26/16   Sanda Klein, MD  azithromycin (ZITHROMAX) 250 MG tablet Take 1 pill a day 07/19/16   Doreatha Lew, MD  cholecalciferol (VITAMIN D) 1000 units tablet Take 1,000 Units by mouth 2 (two) times daily.    Historical Provider, MD  ELIQUIS 2.5 MG TABS tablet TAKE 1 TABLET(2.5 MG) BY MOUTH TWICE DAILY 09/13/16   Mihai Croitoru, MD   escitalopram (LEXAPRO) 10 MG tablet Take 10 mg by mouth daily.    Historical Provider, MD  furosemide (LASIX) 40 MG tablet Take 40 mg by mouth daily as needed (for weight gain of greater than 2lbs).    Historical Provider, MD  ipratropium (ATROVENT) 0.06 % nasal spray Place 2 sprays into both nostrils 4 (four) times daily as needed for rhinitis.    Historical Provider, MD  Ipratropium-Albuterol (COMBIVENT RESPIMAT) 20-100 MCG/ACT AERS respimat Inhale 1 puff into the lungs every 6 (six) hours as needed for wheezing or shortness of breath. 08/29/15   Barton Dubois, MD  metoprolol succinate (TOPROL-XL) 25 MG 24 hr tablet Take 12.5-25 tablets by mouth 2 (two) times daily. Pt takes one tablet in the morning and one-half tablet at bedtime.    Historical Provider, MD  midodrine (PROAMATINE) 2.5 MG tablet Take 2.5 mg by mouth as needed (when systolic BP is lower than 100).     Historical Provider, MD  mirtazapine (REMERON) 15 MG tablet Take 7.5 mg by mouth at bedtime.     Historical Provider, MD  potassium chloride SA (K-DUR,KLOR-CON) 20 MEQ tablet Take 20 mEq by mouth daily as needed (when taking Lasix).     Historical Provider, MD  predniSONE (DELTASONE) 10 MG tablet Take 15 mg by mouth daily.     Historical Provider, MD    Family History No family history on file.  Social History Social History  Substance Use Topics  . Smoking status: Never Smoker  . Smokeless tobacco: Never Used  . Alcohol use No     Allergies   Patient has no known allergies.   Review of Systems Review of Systems   Physical Exam Updated Vital Signs BP (!) 175/94 (BP Location: Right Arm)   Pulse 77   Temp 98.2 F (36.8 C) (Oral)   Resp (!) 22   SpO2 92%   Physical Exam  Constitutional: She is oriented to person, place, and time. She appears well-developed and well-nourished. No distress.  HENT:  Head: Normocephalic and atraumatic.  Eyes: Conjunctivae and EOM are normal.  Cardiovascular: Regular rhythm.   Tachycardia present.   Pulmonary/Chest: No stridor. Tachypnea noted. She has decreased breath sounds.    Abdominal: She exhibits no distension.  Musculoskeletal: She exhibits no edema.  Neurological: She is alert and oriented to person, place, and time. No cranial nerve deficit.  Skin: Skin is warm and dry.  Psychiatric: She has a normal mood and affect.  Nursing note and vitals reviewed.    ED Treatments / Results  Labs (all labs ordered are listed, but only abnormal results are displayed) Labs Reviewed  COMPREHENSIVE METABOLIC PANEL - Abnormal; Notable for the following:       Result Value   Glucose, Bld 121 (*)    Calcium 8.7 (*)    Total Protein 6.3 (*)    Albumin 3.2 (*)    ALT 10 (*)    All other components within normal limits  CBC WITH DIFFERENTIAL/PLATELET -  Abnormal; Notable for the following:    WBC 14.4 (*)    RDW 18.2 (*)    Neutro Abs 12.0 (*)    Monocytes Absolute 1.1 (*)    All other components within normal limits  TROPONIN I - Abnormal; Notable for the following:    Troponin I 0.03 (*)    All other components within normal limits  BRAIN NATRIURETIC PEPTIDE - Abnormal; Notable for the following:    B Natriuretic Peptide 544.3 (*)    All other components within normal limits    EKG  EKG Interpretation  Date/Time:  Wednesday October 05 2016 09:15:50 EDT Ventricular Rate:  75 PR Interval:    QRS Duration: 177 QT Interval:  412 QTC Calculation: 461 R Axis:   -64 Text Interpretation:  A-V dual-paced rhythm with some inhibition No further analysis attempted due to paced rhythm Baseline wander in lead(s) V3 Abnormal ekg Confirmed by Carmin Muskrat  MD [phone]) on 10/05/2016 9:20:04 AM       Radiology Dg Chest 2 View  Result Date: 10/05/2016 CLINICAL DATA:  Increased shortness of breath. Cough, congestion over the last several days. EXAM: CHEST  2 VIEW COMPARISON:  07/16/2016 FINDINGS: The lungs are hyperinflated likely secondary to COPD. There is a  small right pleural effusion. There is right lower lobe airspace disease. The left lung is clear. There is no pneumothorax. There is stable cardiomegaly. There is mild bilateral interstitial prominence. There is a dual lead cardiac pacemaker. The osseous structures are unremarkable. IMPRESSION: 1. Right lower lobe pneumonia. Small right pleural effusion. Followup PA and lateral chest X-ray is recommended in 3-4 weeks following trial of antibiotic therapy to ensure resolution and exclude underlying malignancy. 2. Cardiomegaly with mild pulmonary vascular congestion. Electronically Signed   By: Kathreen Devoid   On: 10/05/2016 09:18   Ct Chest Wo Contrast  Addendum Date: 10/05/2016   ADDENDUM REPORT: 10/05/2016 11:10 ADDENDUM: Study discussed by telephone with Dr. Carmin Muskrat on 10/05/2016 at 1101 hours. Electronically Signed   By: Genevie Ann M.D.   On: 10/05/2016 11:10   Result Date: 10/05/2016 CLINICAL DATA:  83 year old female status post fall 1 week ago with right side chest and rib pain. Abnormal chest radiographs today. EXAM: CT CHEST WITHOUT CONTRAST TECHNIQUE: Multidetector CT imaging of the chest was performed following the standard protocol without IV contrast. COMPARISON:  Chest radiographs 0908 hours today. Lumbar spine CT 05/24/2005 FINDINGS: Cardiovascular: Calcified aorta and coronary artery atherosclerosis. Cardiomegaly. No pericardial effusion. Right chest cardiac pacemaker or AICD. Vascular patency is not evaluated in the absence of IV contrast. Mediastinum/Nodes: 2 cm subcarinal lymph node, favor reactive (See lung findings below). Other mediastinal lymph nodes are within normal limits. Difficult to delineate any hilar lymph nodes in the absence of IV contrast. Mild gaseous distension of the thoracic esophagus appears inconsequential. Lungs/Pleura: Layering right pleural effusion up to 2.5 cm in thickness with simple fluid density. Dependent retained secretions in the right bronchus intermedius  (series 5, image 83). The right middle lobe bronchus is occluded on image 98. The right middle lobe bronchus is also occluded. Other Major airways are patent.  No pneumothorax. Abnormal right lower lobe confluent peribronchial opacity extending from the hilum. Extensive more peripheral right lower lobe nodularity and irregular opacity. In the basilar segments of the right lower lobe there is peribronchial consolidation and confluent irregular opacity. Questionable small areas of cavitation within the distal right lower lobe versus bronchiectasis (series 5, image 123). Superimposed confluent right middle lobe  atelectasis. Both upper lobes demonstrates smooth septal thickening. There is superimposed peripheral right upper lobe subpleural reticular opacity. See also right rib findings below. Trace layering left pleural effusion. Mild mostly dependent opacity in the left lower lobe. Upper Abdomen: Multiple nonspecific hypodense lesions in the visible liver, the largest measuring about 3 cm diameter do have simple fluid density. Negative noncontrast visible spleen, pancreas, adrenal glands, kidneys and bowel in the upper abdomen. Musculoskeletal: Osteopenia. Acute fractures of the right lateral third through seventh ribs. These fractures are nondisplaced except at the third rib on series 5, image 50 where there is mild adjacent intercostal and pleural or extrapleural hematoma. Sternum intact. No vertebral fracture identified. No other acute fracture or left rib fracture identified. Visible shoulder osseous structures appear grossly intact. IMPRESSION: 1. Acute fractures of the right lateral third through seventh ribs. The third rib fracture is mildly displaced with associated small volume adjacent intercostal and pleural or extrapleural hematoma. 2. Opacified or occluded right middle lobe and lower lobe bronchi with widespread abnormal right lower lobe opacity. The appearance does not suggest pulmonary contusion. Severe  aspiration and/or pneumonia is possible but the appearance is suspicious for an obstructing bronchogenic carcinoma. Follow-up bronchoscopy may be most valuable at this point and is recommended. 3. Small layering right pleural effusion. Trace layering left pleural effusion. 4. Increased subcarinal mediastinal lymph node, indeterminate (as in #2). 5. Cardiomegaly. Calcified aortic and coronary artery atherosclerosis. 6. Indeterminate but favor benign multiple low-density lesions in the liver. Electronically Signed: By: Genevie Ann M.D. On: 10/05/2016 10:55    Procedures Procedures (including critical care time)  Medications Ordered in ED Medications  cefTRIAXone (ROCEPHIN) 1 g in dextrose 5 % 50 mL IVPB (1 g Intravenous New Bag/Given 10/05/16 1100)  azithromycin (ZITHROMAX) 500 mg in dextrose 5 % 250 mL IVPB (500 mg Intravenous New Bag/Given 10/05/16 1101)   On repeat exam the patient is now still requiring supplemental oxygen, 2 L, nasal cannula, down from 10 L on arrival. Patient now confirms that her fall was about 9 days ago I have discussed patient's case with our radiology team given acute abnormalities on CT chest, Ancef: Discussed patient with our pulmonology team to arrange for bronchoscopy, given the suspicious findings on aforementioned study.  On repeat exam the patient appears in similar condition. Additional labs notable for elevated BNP,  improved hemoglobin from prior abnormal value, and elevated troponin. Patient denies left-sided chest pain. Patient and daughter are aware of all findings, need for bronchoscopy, continued IV broad-spectrum antibiotics, admission for further evaluation and management.   Initial Impression / Assessment and Plan / ED Course  I have reviewed the triage vital signs and the nursing notes.  Pertinent labs & imaging results that were available during my care of the patient were reviewed by me and considered in my medical decision making (see chart for  details).    Final Clinical Impressions(s) / ED Diagnoses  Community-acquired pneumonia Pleural effusion Multiple rib fractures Fall, initial encounter Hypoxia Elevated troponin  CRITICAL CARE Performed by: Carmin Muskrat Total critical care time: 40 minutes Critical care time was exclusive of separately billable procedures and treating other patients. Critical care was necessary to treat or prevent imminent or life-threatening deterioration. Critical care was time spent personally by me on the following activities: development of treatment plan with patient and/or surrogate as well as nursing, discussions with consultants, evaluation of patient's response to treatment, examination of patient, obtaining history from patient or surrogate, ordering and performing treatments  and interventions, ordering and review of laboratory studies, ordering and review of radiographic studies, pulse oximetry and re-evaluation of patient's condition.     Carmin Muskrat, MD 10/05/16 [phone]

## 2016-10-05 NOTE — ED Notes (Signed)
Dr. Vanita Panda informed of Troponin of 0.0.3.

## 2016-10-06 LAB — BASIC METABOLIC PANEL
Anion gap: 9 (ref 5–15)
BUN: 11 mg/dL (ref 6–20)
CHLORIDE: 104 mmol/L (ref 101–111)
CO2: 27 mmol/L (ref 22–32)
CREATININE: 0.62 mg/dL (ref 0.44–1.00)
Calcium: 8.5 mg/dL — ABNORMAL LOW (ref 8.9–10.3)
GFR calc Af Amer: >60 mL/min (ref >60)
GFR calc non Af Amer: >60 mL/min (ref >60)
Glucose, Bld: 160 mg/dL — ABNORMAL HIGH (ref 65–99)
Potassium: 4.1 mmol/L (ref 3.5–5.1)
Sodium: 140 mmol/L (ref 135–145)

## 2016-10-06 LAB — CBC
HCT: 39.7 % (ref 36.0–46.0)
Hemoglobin: 12.4 g/dL (ref 12.0–15.0)
MCH: 28.5 pg (ref 26.0–34.0)
MCHC: 31.2 g/dL (ref 30.0–36.0)
MCV: 91.3 fL (ref 78.0–100.0)
PLATELETS: 300 10*3/uL (ref 150–400)
RBC: 4.35 MIL/uL (ref 3.87–5.11)
RDW: 17.7 % — AB (ref 11.5–15.5)
WBC: 14.3 10*3/uL — ABNORMAL HIGH (ref 4.0–10.5)

## 2016-10-06 MED ORDER — METHYLPREDNISOLONE SODIUM SUCC 40 MG IJ SOLR
40.0000 mg | Freq: Two times a day (BID) | INTRAMUSCULAR | Status: DC
  Administered 2016-10-06 – 2016-10-07 (×2): 40 mg via INTRAVENOUS
  Filled 2016-10-06 (×2): qty 1

## 2016-10-06 MED ORDER — APIXABAN 2.5 MG PO TABS
2.5000 mg | ORAL_TABLET | Freq: Two times a day (BID) | ORAL | Status: DC
  Administered 2016-10-07 – 2016-10-10 (×6): 2.5 mg via ORAL
  Filled 2016-10-06 (×7): qty 1

## 2016-10-06 NOTE — Progress Notes (Signed)
PULMONARY / CRITICAL CARE MEDICINE   Name: [name] MRN: [id number] DOB: Nov 11, 1930    ADMISSION DATE:  10/05/2016 CONSULTATION DATE:  10/05/16  REFERRING MD:  Kirby Funk MD  CHIEF COMPLAINT:  Fall, abnormal CT scan.  BRIEF SUMMARY:   83 year old with history of complete heart block status post pacemaker, diastolic heart failure, paroxysmal A. fib, and prolymyalgia rheumatica, COPD. She had a fall about 9 days ago while on a trip outside [place]. She did not seek medical attention until now and has been lying in bed. Since her fall she's noticed worsening dyspnea, cough, congestion, mucus production, wheezing. CT scan in the ED shows fractured ribs on the right with extensive consolidation, narrowing of the bronchus on the right with question on endobronchial lesion. PCCM called for further evaluation  She has history of COPD and is maintained on inhalers by primary care. She has not had PFTs or a pulmonary eval,  not on home O2. She still continues to smoke   SUBJECTIVE:  Pt denies acute complaints.  Reports ongoing moist cough.  Daughter in room with multiple questions regarding the bronchoscopy.  Family / patient wavering on if they want the procedure.    VITAL SIGNS: BP (!) 156/59 (BP Location: Right Arm)   Pulse 72   Temp 97.8 F (36.6 C) (Oral)   Resp 18   Ht 5\' 5"  (1.651 m)   Wt 129 lb 11.5 oz (58.8 kg)   SpO2 97%   BMI 21.59 kg/m   HEMODYNAMICS:    VENTILATOR SETTINGS:    INTAKE / OUTPUT: I/O last 3 completed shifts: In: 0  Out: 500 [Urine:500]  PHYSICAL EXAMINATION: General: elderly female in NAD HEENT: MM pink/moist PSY: calm/appropriate Neuro: AAOx4, speech clear, MAE CV: s1s2 rrr, no m/r/g PULM: even/non-labored, diminished on R with scattered crackles, left clear.  Moist cough JJ:KKXF, non-tender, bsx4 active  Extremities: warm/dry, no edema  Skin: no rashes or lesions    LABS:  BMET  Recent Labs Lab 10/05/16 0929 10/06/16 0503  NA  141 140  K 3.7 4.1  CL 104 104  CO2 28 27  BUN 16 11  CREATININE 0.79 0.62  GLUCOSE 121* 160*    Electrolytes  Recent Labs Lab 10/05/16 0929 10/06/16 0503  CALCIUM 8.7* 8.5*    CBC  Recent Labs Lab 10/05/16 0929 10/06/16 0503  WBC 14.4* 14.3*  HGB 12.4 12.4  HCT 40.4 39.7  PLT 344 300    Coag's No results for input(s): APTT, INR in the last 168 hours.  Sepsis Markers No results for input(s): LATICACIDVEN, PROCALCITON, O2SATVEN in the last 168 hours.  ABG No results for input(s): PHART, PCO2ART, PO2ART in the last 168 hours.  Liver Enzymes  Recent Labs Lab 10/05/16 0929  AST 19  ALT 10*  ALKPHOS 62  BILITOT 0.8  ALBUMIN 3.2*    Cardiac Enzymes  Recent Labs Lab 10/05/16 0929  TROPONINI 0.03*    Glucose No results for input(s): GLUCAP in the last 168 hours.  Imaging Ct Chest Wo Contrast  Addendum Date: 10/05/2016   ADDENDUM REPORT: 10/05/2016 11:10 ADDENDUM: Study discussed by telephone with Dr. Carmin Muskrat on 10/05/2016 at 1101 hours. Electronically Signed   By: Genevie Ann M.D.   On: 10/05/2016 11:10   Result Date: 10/05/2016 CLINICAL DATA:  83 year old female status post fall 1 week ago with right side chest and rib pain. Abnormal chest radiographs today. EXAM: CT CHEST WITHOUT CONTRAST TECHNIQUE: Multidetector CT imaging of the chest was  performed following the standard protocol without IV contrast. COMPARISON:  Chest radiographs 0908 hours today. Lumbar spine CT 05/24/2005 FINDINGS: Cardiovascular: Calcified aorta and coronary artery atherosclerosis. Cardiomegaly. No pericardial effusion. Right chest cardiac pacemaker or AICD. Vascular patency is not evaluated in the absence of IV contrast. Mediastinum/Nodes: 2 cm subcarinal lymph node, favor reactive (See lung findings below). Other mediastinal lymph nodes are within normal limits. Difficult to delineate any hilar lymph nodes in the absence of IV contrast. Mild gaseous distension of the thoracic  esophagus appears inconsequential. Lungs/Pleura: Layering right pleural effusion up to 2.5 cm in thickness with simple fluid density. Dependent retained secretions in the right bronchus intermedius (series 5, image 83). The right middle lobe bronchus is occluded on image 98. The right middle lobe bronchus is also occluded. Other Major airways are patent.  No pneumothorax. Abnormal right lower lobe confluent peribronchial opacity extending from the hilum. Extensive more peripheral right lower lobe nodularity and irregular opacity. In the basilar segments of the right lower lobe there is peribronchial consolidation and confluent irregular opacity. Questionable small areas of cavitation within the distal right lower lobe versus bronchiectasis (series 5, image 123). Superimposed confluent right middle lobe atelectasis. Both upper lobes demonstrates smooth septal thickening. There is superimposed peripheral right upper lobe subpleural reticular opacity. See also right rib findings below. Trace layering left pleural effusion. Mild mostly dependent opacity in the left lower lobe. Upper Abdomen: Multiple nonspecific hypodense lesions in the visible liver, the largest measuring about 3 cm diameter do have simple fluid density. Negative noncontrast visible spleen, pancreas, adrenal glands, kidneys and bowel in the upper abdomen. Musculoskeletal: Osteopenia. Acute fractures of the right lateral third through seventh ribs. These fractures are nondisplaced except at the third rib on series 5, image 50 where there is mild adjacent intercostal and pleural or extrapleural hematoma. Sternum intact. No vertebral fracture identified. No other acute fracture or left rib fracture identified. Visible shoulder osseous structures appear grossly intact. IMPRESSION: 1. Acute fractures of the right lateral third through seventh ribs. The third rib fracture is mildly displaced with associated small volume adjacent intercostal and pleural or  extrapleural hematoma. 2. Opacified or occluded right middle lobe and lower lobe bronchi with widespread abnormal right lower lobe opacity. The appearance does not suggest pulmonary contusion. Severe aspiration and/or pneumonia is possible but the appearance is suspicious for an obstructing bronchogenic carcinoma. Follow-up bronchoscopy may be most valuable at this point and is recommended. 3. Small layering right pleural effusion. Trace layering left pleural effusion. 4. Increased subcarinal mediastinal lymph node, indeterminate (as in #2). 5. Cardiomegaly. Calcified aortic and coronary artery atherosclerosis. 6. Indeterminate but favor benign multiple low-density lesions in the liver. Electronically Signed: By: Genevie Ann M.D. On: 10/05/2016 10:55     STUDIES:  CT chest 10/05/16 > trace right effusion, multiple rib fractures on R, extensive consolidative changes and RML / LL and LL with narrowing of right mainstem bronchus. Subcarinal lymph node   CULTURES:   ANTIBIOTICS: Ceftriaxone 4/18 >> Azithro 4/18 >>  SIGNIFICANT EVENTS: 4/18  Admit with multilobar PNA after mechanical fall with rib fractures   LINES/TUBES:   DISCUSSION: 83 year old with COPD, smoker presenting after a fall with rib fractures, multilobar pneumonia on the right.  Suspect she developed a PNA after a fall due to splinting in the setting of multiple rib fractures with inability to clear secretions.  On CT, there is also a narrowing of the R mainstem bronchus which is likely mucus but could be  an endobronchial lesion given her hx of smoking. Family initially anxious to have bronchoscopy but now are wavering on procedure.     Abnormal CT Scan - rule out endobronchial lesion Plan: Scheduled for FOB on 4/20 for airway examination  HOLD eliquis until 4/20 pm assuming no bleeding post procedure  NPO after MN for procedure in am  Pre-bronch orders placed Alternate option would be to treat conservatively for PNA and bring  back for follow up CT post abx   Right PNA COPD with Acute Exacerbation  Plan: Continue rocephin + azithromycin, D2/x Nebulized bronchodilators Reduce Solumedrol to 40 mg IV BID Push pulmonary hygiene > IS, mobilize  Intermittent CXR Mucinex BID   Right Rib Fractures  Plan: PRN tramadol   Noe Gens, NP-C [facility] Pgr: [phone] or if no answer [phone] 10/06/2016, 10:25 AM

## 2016-10-06 NOTE — Progress Notes (Addendum)
PROGRESS NOTE  [name] ZSW:[id number] DOB: 12/01/1930 DOA: 10/05/2016 PCP: Mathews Argyle, MD  HPI/Recap of past 24 hours: Patient is a very nice 83 year old female with past mental history of COPD and chronic diastolic heart failure who had sustained a fall on her right side over a week ago. Since that time, she has had some pain but also increasing shortness of breath, cough and wheeze. She came into the emergency room on 4/18 and a CT scan noted right-sided fractured ribs with extensive consolidation consistent with a pneumonia as well as a narrowing of the right mainstem bronchus possibly mucus, but also given her history of smoking, could be an endobronchial lesion. Pulmonary consulted who offered patient bronchoscopy which would be done on 4/20. Patient started on IV antibiotics and brought into the hospitalist service.  This morning, patient feeling better. Breathing a little bit easier. Less pain on the right side, only with adjustment of position.  Assessment/Plan: Principal Problem:   Acute hypoxemic respiratory failure (HCC) secondary to community-acquired pneumonia: Continue oxygen support plus nebulizers and antibiotics. Have added incentive spirometry. Active Problems:   Pacemaker   SSS (sick sinus syndrome) ([place]) status post pacemaker: Stable    Paroxysmal atrial fibrillation (HCC) on chronic anticoagulation: Currently in normal sinus rhythm. Chads 2 score of 6. Holding her eliquis tomorrow if she chooses to have bronchoscopy done    Systemic hypertension: Continue antihypertensives    COPD (chronic obstructive pulmonary disease) ([place]): Continue oxygen plus nebulizers plus steroids. Continue to taper down.   Chronic diastolic CHF (congestive heart failure) ([place]): Currently euvolemic.  Mildly elevated BNP more from her COPD and pneumonia issues. The same can also be said for her elevated troponin of 0.03  Questionable lesion seen on CT: Perhaps mucous versus  endobronchial lesion. Patient deciding on whether she wants of bronchoscopy.   Closed multiple fractures of right upper extremity with ribs with routine healing: We'll notify trauma service. Stable. Continue incentive spirometry use.   Code Status: Full code   Family Communication: Daughter at the bedside   Disposition Plan: If she decides against bronchoscopy, anticipate discharge next one to 2 days. She decides to do bronchoscopy and a discharge this weekend    Consultants:  Pulmonary   Procedures:  Potential bronchoscopy 4/20   Antimicrobials:  IV Rocephin and Zithromax 4/18-present  DVT prophylaxis: On eliquis   Objective: Vitals:   10/06/16 0539 10/06/16 0908 10/06/16 1158 10/06/16 1317  BP: (!) 156/59   (!) 111/58  Pulse: 72 72  76  Resp: 18 18  18   Temp: 97.8 F (36.6 C)   97.8 F (36.6 C)  TempSrc: Oral   Oral  SpO2: 96% 97% 95% 95%  Weight: 58.8 kg (129 lb 11.5 oz)     Height:        Intake/Output Summary (Last 24 hours) at 10/06/16 1352 Last data filed at 10/06/16 0947  Gross per 24 hour  Intake              200 ml  Output              700 ml  Net             -500 ml   Filed Weights   10/05/16 1630 10/06/16 0539  Weight: 59 kg (130 lb) 58.8 kg (129 lb 11.5 oz)    Exam:   General:  Alert and oriented 3, no acute distress   Cardiovascular: Regular rate and rhythm, S1-S2   Respiratory:  Decreased inspiratory effort, decreased breath sounds throughout   Abdomen: Soft, nontender, nondistended, positive bowel sounds   Musculoskeletal: No clubbing or cyanosis or edema   Skin: No skin breaks, tears or lesions. She does have significant bruising, especially on her right arm.  Psychiatry: Patient is appropriate, no evidence of psychoses    Data Reviewed: CBC:  Recent Labs Lab 10/05/16 0929 10/06/16 0503  WBC 14.4* 14.3*  NEUTROABS 12.0*  --   HGB 12.4 12.4  HCT 40.4 39.7  MCV 88.6 91.3  PLT 344 010   Basic Metabolic  Panel:  Recent Labs Lab 10/05/16 0929 10/06/16 0503  NA 141 140  K 3.7 4.1  CL 104 104  CO2 28 27  GLUCOSE 121* 160*  BUN 16 11  CREATININE 0.79 0.62  CALCIUM 8.7* 8.5*   GFR: Estimated Creatinine Clearance: 47.9 mL/min (by C-G formula based on SCr of 0.62 mg/dL). Liver Function Tests:  Recent Labs Lab 10/05/16 0929  AST 19  ALT 10*  ALKPHOS 62  BILITOT 0.8  PROT 6.3*  ALBUMIN 3.2*   No results for input(s): LIPASE, AMYLASE in the last 168 hours. No results for input(s): AMMONIA in the last 168 hours. Coagulation Profile: No results for input(s): INR, PROTIME in the last 168 hours. Cardiac Enzymes:  Recent Labs Lab 10/05/16 0929  TROPONINI 0.03*   BNP (last 3 results) No results for input(s): PROBNP in the last 8760 hours. HbA1C: No results for input(s): HGBA1C in the last 72 hours. CBG: No results for input(s): GLUCAP in the last 168 hours. Lipid Profile: No results for input(s): CHOL, HDL, LDLCALC, TRIG, CHOLHDL, LDLDIRECT in the last 72 hours. Thyroid Function Tests: No results for input(s): TSH, T4TOTAL, FREET4, T3FREE, THYROIDAB in the last 72 hours. Anemia Panel: No results for input(s): VITAMINB12, FOLATE, FERRITIN, TIBC, IRON, RETICCTPCT in the last 72 hours. Urine analysis:    Component Value Date/Time   COLORURINE YELLOW 06/07/2015 0254   APPEARANCEUR CLOUDY (A) 06/07/2015 0254   LABSPEC 1.014 06/07/2015 0254   PHURINE 6.0 06/07/2015 0254   GLUCOSEU 500 (A) 06/07/2015 0254   HGBUR LARGE (A) 06/07/2015 0254   BILIRUBINUR NEGATIVE 06/07/2015 0254   KETONESUR NEGATIVE 06/07/2015 0254   PROTEINUR NEGATIVE 06/07/2015 0254   UROBILINOGEN 1.0 01/19/2011 1030   NITRITE NEGATIVE 06/07/2015 0254   LEUKOCYTESUR NEGATIVE 06/07/2015 0254   Sepsis Labs: @LABRCNTIP (procalcitonin:4,lacticidven:4)  )No results found for this or any previous visit (from the past 240 hour(s)).    Studies: No results found.  Scheduled Meds: . ALPRAZolam  0.25 mg  Oral QHS  . [START ON 10/07/2016] apixaban  2.5 mg Oral BID  . azithromycin  500 mg Oral Daily  . cholecalciferol  1,000 Units Oral BID  . escitalopram  10 mg Oral Daily  . ferrous sulfate  325 mg Oral Q breakfast  . guaiFENesin  600 mg Oral BID  . ipratropium-albuterol  3 mL Nebulization QID  . methylPREDNISolone (SOLU-MEDROL) injection  40 mg Intravenous Q12H  . metoprolol succinate  12.5 mg Oral QHS  . metoprolol succinate  25 mg Oral Daily  . mirtazapine  7.5 mg Oral QHS  . sodium chloride flush  3 mL Intravenous Q12H    Continuous Infusions: . sodium chloride    . cefTRIAXone (ROCEPHIN)  IV Stopped (10/06/16 0947)     LOS: 1 day     Annita Brod, MD [facility] Pager [phone]  If 7PM-7AM, please contact night-coverage [email or web address] Password [email or web address] 10/06/2016, 1:52 PM

## 2016-10-07 ENCOUNTER — Inpatient Hospital Stay (HOSPITAL_COMMUNITY): Payer: Medicare Other

## 2016-10-07 ENCOUNTER — Encounter (HOSPITAL_COMMUNITY)
Admission: EM | Disposition: A | Discharge status: Home / Self Care | Payer: Self-pay | Source: Home / Self Care | Attending: Internal Medicine

## 2016-10-07 HISTORY — PX: VIDEO BRONCHOSCOPY: SHX5072

## 2016-10-07 LAB — CBC
HCT: 40.3 % (ref 36.0–46.0)
Hemoglobin: 12.3 g/dL (ref 12.0–15.0)
MCH: 28 pg (ref 26.0–34.0)
MCHC: 30.5 g/dL (ref 30.0–36.0)
MCV: 91.6 fL (ref 78.0–100.0)
PLATELETS: 328 10*3/uL (ref 150–400)
RBC: 4.4 MIL/uL (ref 3.87–5.11)
RDW: 17.6 % — AB (ref 11.5–15.5)
WBC: 17.6 10*3/uL — AB (ref 4.0–10.5)

## 2016-10-07 SURGERY — BRONCHOSCOPY, WITH FLUOROSCOPY
Anesthesia: Moderate Sedation | Laterality: Bilateral

## 2016-10-07 MED ORDER — FENTANYL CITRATE (PF) 100 MCG/2ML IJ SOLN
INTRAMUSCULAR | Status: DC | PRN
  Administered 2016-10-07 (×3): 25 ug via INTRAVENOUS

## 2016-10-07 MED ORDER — LIDOCAINE HCL (PF) 1 % IJ SOLN
INTRAMUSCULAR | Status: DC | PRN
  Administered 2016-10-07: 6 mL

## 2016-10-07 MED ORDER — BUTAMBEN-TETRACAINE-BENZOCAINE 2-2-14 % EX AERO: 1.0000 | INHALATION_SPRAY | Freq: Once | CUTANEOUS | Status: DC

## 2016-10-07 MED ORDER — LIDOCAINE HCL 2 % EX GEL
CUTANEOUS | Status: DC | PRN
  Administered 2016-10-07: 1

## 2016-10-07 MED ORDER — SODIUM CHLORIDE 0.9 % IV SOLN
INTRAVENOUS | Status: DC
  Administered 2016-10-07: 13:00:00 via INTRAVENOUS

## 2016-10-07 MED ORDER — FENTANYL CITRATE (PF) 100 MCG/2ML IJ SOLN
INTRAMUSCULAR | Status: AC
  Filled 2016-10-07: qty 4

## 2016-10-07 MED ORDER — LIDOCAINE HCL 2 % EX GEL: 1.0000 "application " | Freq: Once | CUTANEOUS | Status: DC

## 2016-10-07 MED ORDER — PHENYLEPHRINE HCL 0.25 % NA SOLN: 1.0000 | Freq: Four times a day (QID) | NASAL | Status: DC | PRN

## 2016-10-07 MED ORDER — PHENYLEPHRINE HCL 0.25 % NA SOLN
NASAL | Status: DC | PRN
  Administered 2016-10-07: 2 via NASAL

## 2016-10-07 MED ORDER — METHYLPREDNISOLONE SODIUM SUCC 40 MG IJ SOLR
20.0000 mg | Freq: Two times a day (BID) | INTRAMUSCULAR | Status: DC
  Administered 2016-10-07 – 2016-10-08 (×2): 20 mg via INTRAVENOUS
  Filled 2016-10-07: qty 0.5
  Filled 2016-10-07: qty 1
  Filled 2016-10-07 (×3): qty 0.5
  Filled 2016-10-07: qty 1

## 2016-10-07 MED ORDER — MIDAZOLAM HCL 10 MG/2ML IJ SOLN
INTRAMUSCULAR | Status: DC | PRN
  Administered 2016-10-07 (×3): 1 mg via INTRAVENOUS

## 2016-10-07 MED ORDER — MIDAZOLAM HCL 5 MG/ML IJ SOLN
INTRAMUSCULAR | Status: AC
  Filled 2016-10-07: qty 2

## 2016-10-07 NOTE — Care Management Note (Signed)
Case Management Note  Patient Details  Name: [name] MRN: [id number] Date of Birth: 02-21-1931  Subjective/Objective:     SBO, NPO               Action/Plan: pt not discharging this WE   Expected Discharge Date:   (unknown)               Expected Discharge Plan:  [place]  In-House Referral:     Discharge planning Services  CM Consult  Post Acute Care Choice:    Choice offered to:     DME Arranged:    DME Agency:     HH Arranged:    HH Agency:     Status of Service:  In process, will continue to follow  If discussed at Long Length of Stay Meetings, dates discussed:    Additional CommentsPurcell Mouton, RN 10/07/2016, 3:05 PM

## 2016-10-07 NOTE — Op Note (Signed)
[facility] Cardiopulmonary Patient Name: [name] Procedure Date: 10/07/2016 MRN: [id number] Attending MD: Marshell Garfinkel , MD Date of Birth: 1930/10/12 CSN: [id number] Age: 83 Admit Type: Inpatient Ethnicity: Not Hispanic or Latino Procedure:            Bronchoscopy Indications:          Abnormal CT scan of chest Providers:            Marshell Garfinkel, MD, Ashley Mariner RRT,RCP, Phillis Knack                        RRT, RCP Referring MD:          Medicines:            Midazolam 3 mg mg IV, Fentanyl 75 mcg IV Complications:        None                       No immediate complications Estimated Blood Loss: Estimated blood loss: none. Procedure:      Pre-Anesthesia Assessment:      - A History and Physical has been performed. Patient meds and allergies       have been reviewed. The risks and benefits of the procedure and the       sedation options and risks were discussed with the patient. All       questions were answered and informed consent was obtained. Patient       identification and proposed procedure were verified prior to the       procedure by the physician in the procedure room. Mental Status       Examination: alert and oriented. Airway Examination: normal       oropharyngeal airway. Respiratory Examination: clear to auscultation. CV       Examination: RRR, no murmurs, no S3 or S4. ASA Grade Assessment: II - A       patient with mild systemic disease. After reviewing the risks and       benefits, the patient was deemed in satisfactory condition to undergo       the procedure. The anesthesia plan was to use no sedation or anesthesia.       Immediately prior to administration of medications, the patient was       re-assessed for adequacy to receive sedatives. The heart rate,       respiratory rate, oxygen saturations, blood pressure, adequacy of       pulmonary ventilation, and response to care were monitored throughout       the procedure. The physical  status of the patient was re-assessed after       the procedure.      After obtaining informed consent, the bronchoscope was passed under       direct vision. Throughout the procedure, the patient's blood pressure,       pulse, and oxygen saturations were monitored continuously. the [id number]       ([id number]) scope was introduced through the right nostril and advanced to       the tracheobronchial tree. Findings:      The examination of the bronchial tree showed collapsible airways       consistent with tracheomalacia. There was airway edema with mucopurulent       seceretions on right. The seceretions were clered using saline lavage.       Washings were obtained in the right lower lobe and  sent for cell count,       bacterial culture, viral smears & culture, and fungal & AFB analysis.       The return was mucopurulent. Multiple specimens were obtained and pooled       into one specimen, which was sent for analysis. Impression:      - Abnormal CT scan of chest      - Washings were obtained.      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 The following parameters were       monitored: oxygen saturation, heart rate, blood pressure, respiratory       rate, EKG, adequacy of pulmonary ventilation, and response to care.       Total physician intraservice time was 15 minutes. Recommendation:      - Await washing results. Procedure Code(s):      --- Professional ---      [phone], Bronchoscopy, rigid or flexible, including fluoroscopic guidance,       when performed; diagnostic, with cell washing, when performed (separate       procedure)      99152, Moderate sedation services provided by the same physician or       other qualified health care professional performing the diagnostic or       therapeutic service  that the sedation supports, requiring the presence       of an independent trained observer to assist in the monitoring of the       patient's level of consciousness and physiological status; initial 15       minutes of intraservice time, patient age 53 years or older Diagnosis Code(s):      --- Professional ---      R93.8, Abnormal findings on diagnostic imaging of other specified body       structures CPT copyright 2016 [employer]. All rights reserved. The codes documented in this report are preliminary and upon coder review may  be revised to meet current compliance requirements. Marshell Garfinkel, MD 10/07/2016 12:39:26 PM Number of Addenda: 0 Scope In: 12:15:50 PM Scope Out: 12:27:15 PM

## 2016-10-07 NOTE — Progress Notes (Signed)
PROGRESS NOTE  NAVJOT PILGRIM CWC:[id number] DOB: December 07, 1930 DOA: 10/05/2016 PCP: Mathews Argyle, MD  HPI/Recap of past 24 hours: Patient is a very nice 83 year old female with past mental history of COPD and chronic diastolic heart failure who had sustained a fall on her right side over a week ago. Since that time, she has had some pain but also increasing shortness of breath, cough and wheeze. She came into the emergency room on 4/18 and a CT scan noted right-sided fractured ribs with extensive consolidation consistent with a pneumonia as well as a narrowing of the right mainstem bronchus possibly mucus, but also given her history of smoking, could be an endobronchial lesion. Pulmonary consulted who offered patient bronchoscopy which would be done on 4/20. Patient started on IV antibiotics and brought into the hospitalist service.  This morning, patient doing okay. She feels very thirsty since she is nothing by mouth. She consented to bronchoscopy which is scheduled to take place at lunchtime. She feels overall little bit weaker. Daughter notes that time she does get choked up with food.  Assessment/Plan: Principal Problem:   Acute hypoxemic respiratory failure (HCC) secondary to community-acquired pneumonia: Continue oxygen support plus nebulizers and antibiotics. Have added incentive spirometry. Because of reports of questionable dysphagia, will ask speech therapy to evaluate Active Problems:   Pacemaker   SSS (sick sinus syndrome) (HCC) status post pacemaker: Stable    Paroxysmal atrial fibrillation (HCC) on chronic anticoagulation: Currently in normal sinus rhythm. Chads 2 score of 6. Holding her eliquis tomorrow if she chooses to have bronchoscopy done    Systemic hypertension: Continue antihypertensives    COPD (chronic obstructive pulmonary disease) ([place]): Continue oxygen plus nebulizers plus steroids. Continue to taper down.   Chronic diastolic CHF (congestive heart failure)  ([place]): Currently euvolemic.  Mildly elevated BNP more from her COPD and pneumonia issues. The same can also be said for her elevated troponin of 0.03. weight unchanged from previous day.  Questionable lesion seen on CT: Perhaps mucous versus endobronchial lesion. For bronchoscopy today.    Closed multiple fractures of right upper extremity with ribs with routine healing: We'll notify trauma service. Stable. Continue incentive spirometry use.   Code Status: Full code   Family Communication: Daughter at the bedside   Disposition Plan: Depending on bronchoscopy results. PT and speech therapy to evaluate as well   Consultants:  Pulmonary   Procedures:  Potential bronchoscopy 4/20   Antimicrobials:  IV Rocephin and Zithromax 4/18-present  DVT prophylaxis: On eliquis-resume after bronchoscopy   Objective: Vitals:   10/07/16 0517 10/07/16 1136 10/07/16 1140 10/07/16 1145  BP: (!) 172/70 (!) 163/111 (!) 182/72   Pulse: 70 74    Resp: 18 (!) 22 19 (!) 27  Temp: 98.2 F (36.8 C) 98.6 F (37 C)    TempSrc: Oral Oral    SpO2: 98% 93% 98% 94%  Weight:  58.5 kg (129 lb)    Height:  5\' 5"  (1.651 m)     No intake or output data in the 24 hours ending 10/07/16 1219 Filed Weights   10/05/16 1630 10/06/16 0539 10/07/16 1136  Weight: 59 kg (130 lb) 58.8 kg (129 lb 11.5 oz) 58.5 kg (129 lb)    Exam:   General:  Alert and oriented 3, no acute distress   Cardiovascular: Regular rate and rhythm, S1-S2 , Occasional ectopic beat  Respiratory: Excellent inspiratory effort, decreased breath sounds throughout   Abdomen: Soft, nontender, nondistended, positive bowel sounds   Musculoskeletal: No  clubbing or cyanosis or edema   Skin: No skin breaks, tears or lesions. She does have significant bruising, especially on her right arm.  Psychiatry: Patient is appropriate, no evidence of psychoses    Data Reviewed: CBC:  Recent Labs Lab 10/05/16 0929 10/06/16 0503 10/07/16 0453    WBC 14.4* 14.3* 17.6*  NEUTROABS 12.0*  --   --   HGB 12.4 12.4 12.3  HCT 40.4 39.7 40.3  MCV 88.6 91.3 91.6  PLT 344 300 767   Basic Metabolic Panel:  Recent Labs Lab 10/05/16 0929 10/06/16 0503  NA 141 140  K 3.7 4.1  CL 104 104  CO2 28 27  GLUCOSE 121* 160*  BUN 16 11  CREATININE 0.79 0.62  CALCIUM 8.7* 8.5*   GFR: Estimated Creatinine Clearance: 47.9 mL/min (by C-G formula based on SCr of 0.62 mg/dL). Liver Function Tests:  Recent Labs Lab 10/05/16 0929  AST 19  ALT 10*  ALKPHOS 62  BILITOT 0.8  PROT 6.3*  ALBUMIN 3.2*   No results for input(s): LIPASE, AMYLASE in the last 168 hours. No results for input(s): AMMONIA in the last 168 hours. Coagulation Profile: No results for input(s): INR, PROTIME in the last 168 hours. Cardiac Enzymes:  Recent Labs Lab 10/05/16 0929  TROPONINI 0.03*   BNP (last 3 results) No results for input(s): PROBNP in the last 8760 hours. HbA1C: No results for input(s): HGBA1C in the last 72 hours. CBG: No results for input(s): GLUCAP in the last 168 hours. Lipid Profile: No results for input(s): CHOL, HDL, LDLCALC, TRIG, CHOLHDL, LDLDIRECT in the last 72 hours. Thyroid Function Tests: No results for input(s): TSH, T4TOTAL, FREET4, T3FREE, THYROIDAB in the last 72 hours. Anemia Panel: No results for input(s): VITAMINB12, FOLATE, FERRITIN, TIBC, IRON, RETICCTPCT in the last 72 hours. Urine analysis:    Component Value Date/Time   COLORURINE YELLOW 06/07/2015 0254   APPEARANCEUR CLOUDY (A) 06/07/2015 0254   LABSPEC 1.014 06/07/2015 0254   PHURINE 6.0 06/07/2015 0254   GLUCOSEU 500 (A) 06/07/2015 0254   HGBUR LARGE (A) 06/07/2015 0254   BILIRUBINUR NEGATIVE 06/07/2015 0254   KETONESUR NEGATIVE 06/07/2015 0254   PROTEINUR NEGATIVE 06/07/2015 0254   UROBILINOGEN 1.0 01/19/2011 1030   NITRITE NEGATIVE 06/07/2015 0254   LEUKOCYTESUR NEGATIVE 06/07/2015 0254   Sepsis Labs: @LABRCNTIP (procalcitonin:4,lacticidven:4)  )No  results found for this or any previous visit (from the past 240 hour(s)).    Studies: No results found.  Scheduled Meds: . [MAR Hold] ALPRAZolam  0.25 mg Oral QHS  . [MAR Hold] apixaban  2.5 mg Oral BID  . [MAR Hold] azithromycin  500 mg Oral Daily  . butamben-tetracaine-benzocaine  1 spray Topical Once  . [MAR Hold] cholecalciferol  1,000 Units Oral BID  . [MAR Hold] escitalopram  10 mg Oral Daily  . [MAR Hold] ferrous sulfate  325 mg Oral Q breakfast  . [MAR Hold] guaiFENesin  600 mg Oral BID  . [MAR Hold] ipratropium-albuterol  3 mL Nebulization QID  . lidocaine  1 application Topical Once  . methylPREDNISolone (SOLU-MEDROL) injection  20 mg Intravenous Q12H  . [MAR Hold] metoprolol succinate  12.5 mg Oral QHS  . [MAR Hold] metoprolol succinate  25 mg Oral Daily  . [MAR Hold] mirtazapine  7.5 mg Oral QHS  . [MAR Hold] sodium chloride flush  3 mL Intravenous Q12H    Continuous Infusions: . [MAR Hold] sodium chloride    . sodium chloride    . [MAR Hold] cefTRIAXone (ROCEPHIN)  IV Stopped (10/06/16 6803)     LOS: 2 days     Annita Brod, MD [facility] Pager [phone]  If 7PM-7AM, please contact night-coverage [email or web address] Password [email or web address] 10/07/2016, 12:19 PM

## 2016-10-07 NOTE — Progress Notes (Signed)
PULMONARY / CRITICAL CARE MEDICINE   Name: [name] MRN: [id number] DOB: 10-01-1930    ADMISSION DATE:  10/05/2016 CONSULTATION DATE:  10/05/16  REFERRING MD:  Kirby Funk MD  CHIEF COMPLAINT:  Fall, abnormal CT scan.  BRIEF SUMMARY:   83 year old with history of complete heart block status post pacemaker, diastolic heart failure, paroxysmal A. fib, and prolymyalgia rheumatica, COPD. She had a fall about 9 days ago while on a trip outside [place]. She did not seek medical attention until now and has been lying in bed. Since her fall she's noticed worsening dyspnea, cough, congestion, mucus production, wheezing. CT scan in the ED shows fractured ribs on the right with extensive consolidation, narrowing of the bronchus on the right with question on endobronchial lesion. PCCM called for further evaluation  She has history of COPD and is maintained on inhalers by primary care. She has not had PFTs or a pulmonary eval,  not on home O2. She still continues to smoke   SUBJECTIVE: No major issues ovenight  VITAL SIGNS: BP (!) 136/47 (BP Location: Left Arm)   Pulse 71   Temp 98.1 F (36.7 C) (Oral)   Resp 18   Ht _0  (1.651 m)   Wt 129 lb (58.5 kg)   SpO2 94%   BMI 21.47 kg/m   HEMODYNAMICS:    VENTILATOR SETTINGS:    INTAKE / OUTPUT: I/O last 3 completed shifts: In: 200 [P.O.:200] Out: 700 [Urine:700]  PHYSICAL EXAMINATION: Blood pressure (!) 136/47, pulse 71, temperature 98.1 F (36.7 C), temperature source Oral, resp. rate 18, height _1  (1.651 m), weight 129 lb (58.5 kg), SpO2 94 %. Gen:      No acute distress HEENT:  EOMI, sclera anicteric Neck:     No masses; no thyromegaly Lungs:    B/L rhonchi; normal respiratory effort CV:         Regular rate and rhythm; no murmurs Abd:      + bowel sounds; soft, non-tender; no palpable masses, no distension Ext:    No edema; adequate peripheral perfusion Skin:      Warm and dry; no rash Neuro: alert and oriented x  3 Psych: normal mood and affect   LABS:  BMET  Recent Labs Lab 10/05/16 0929 10/06/16 0503  NA 141 140  K 3.7 4.1  CL 104 104  CO2 28 27  BUN 16 11  CREATININE 0.79 0.62  GLUCOSE 121* 160*    Electrolytes  Recent Labs Lab 10/05/16 0929 10/06/16 0503  CALCIUM 8.7* 8.5*    CBC  Recent Labs Lab 10/05/16 0929 10/06/16 0503 10/07/16 0453  WBC 14.4* 14.3* 17.6*  HGB 12.4 12.4 12.3  HCT 40.4 39.7 40.3  PLT 344 300 328    Coag's No results for input(s): APTT, INR in the last 168 hours.  Sepsis Markers No results for input(s): LATICACIDVEN, PROCALCITON, O2SATVEN in the last 168 hours.  ABG No results for input(s): PHART, PCO2ART, PO2ART in the last 168 hours.  Liver Enzymes  Recent Labs Lab 10/05/16 0929  AST 19  ALT 10*  ALKPHOS 62  BILITOT 0.8  ALBUMIN 3.2*    Cardiac Enzymes  Recent Labs Lab 10/05/16 0929  TROPONINI 0.03*    Glucose No results for input(s): GLUCAP in the last 168 hours.  Imaging Dg Chest Port 1 View  Result Date: 10/07/2016 CLINICAL DATA:  Status post bronchoscopy. EXAM: PORTABLE CHEST 1 VIEW COMPARISON:  Radiographs and CT scan of October 05, 2016. FINDINGS: Stable  cardiomegaly with central pulmonary vascular congestion. Atherosclerosis of thoracic aorta is noted. Right-sided pacemaker is unchanged in position. Minimal bilateral pleural effusions are noted. Bibasilar atelectasis or edema is noted. Bony thorax is unremarkable. No pneumothorax is noted. IMPRESSION: Aortic atherosclerosis. Stable cardiomegaly with central pulmonary vascular congestion. Mild bibasilar atelectasis or edema is noted with minimal associated pleural effusions. No pneumothorax is noted. Electronically Signed   By: Marijo Conception, M.D.   On: 10/07/2016 13:26     STUDIES:  CT chest 10/05/16 > trace right effusion, multiple rib fractures on R, extensive consolidative changes and RML / LL and LL with narrowing of right mainstem bronchus. Subcarinal lymph  node Post bronch CXR 10/07/16 > Mild vascular congestion. bibasal atelectasis. No pneumothorax. All images reviewed.   CULTURES: BAL cultures 4/20 >  ANTIBIOTICS: Ceftriaxone 4/18 >> Azithro 4/18 >>  SIGNIFICANT EVENTS: 4/18  Admit with multilobar PNA after mechanical fall with rib fractures   LINES/TUBES:   DISCUSSION: 83 year old with COPD, smoker presenting after a fall with rib fractures, multilobar pneumonia on the right.  Suspect she developed a PNA after a fall due to splinting in the setting of multiple rib fractures with inability to clear secretions.  On CT, there is also a narrowing of the R mainstem bronchus which is likely mucus but could be an endobronchial lesion given her hx of smoking.   Right PNA COPD with Acute Exacerbation  Abnormal CT Scan - rule out endobronchial lesion Plan: Bronch today just showed airway edema, collapsible airways and mucus. There was no suspicious lesions seen Ok to resume eliquis. Continue treatment with abx as now. Follow bronch cultures. Continue prednisone. Can change to prednisone 60 mg and start taper. Continue nebs.  We will arrange follow up with me in pulmonary clinic about 1 month from discharge. She will need a repeat CT scan to ensure resolution of infiltrates.  PCCM will be available as needed for the rest of this admission. Please call with any questions.  Marshell Garfinkel MD [facility] Pager [phone] If no answer or after 3pm call: [phone] 10/07/2016, 5:54 PM

## 2016-10-07 NOTE — Progress Notes (Signed)
Video bronchoscopy performed.  Intervention bronchial alveolar lavage.  No complications noted.  Will continue to monitor.

## 2016-10-07 NOTE — Progress Notes (Signed)
SLP Cancellation Note  Patient Details Name: [name] MRN: [id number] DOB: December 17, 1930   Cancelled treatment:       Reason Eval/Treat Not Completed: Patient at procedure or test/unavailable   Tiara Bartoli, Alyviah Crandle 10/07/2016, 12:20 PM

## 2016-10-08 DIAGNOSIS — I5033 Acute on chronic diastolic (congestive) heart failure: Secondary | ICD-10-CM

## 2016-10-08 LAB — ACID FAST SMEAR (AFB, MYCOBACTERIA): Acid Fast Smear: NEGATIVE

## 2016-10-08 LAB — ACID FAST SMEAR (AFB)

## 2016-10-08 LAB — BRAIN NATRIURETIC PEPTIDE: B Natriuretic Peptide: 675.1 pg/mL — ABNORMAL HIGH (ref 0.0–100.0)

## 2016-10-08 MED ORDER — IPRATROPIUM-ALBUTEROL 0.5-2.5 (3) MG/3ML IN SOLN
3.0000 mL | Freq: Three times a day (TID) | RESPIRATORY_TRACT | Status: DC
  Administered 2016-10-08 – 2016-10-10 (×6): 3 mL via RESPIRATORY_TRACT
  Filled 2016-10-08 (×7): qty 3

## 2016-10-08 MED ORDER — IPRATROPIUM-ALBUTEROL 0.5-2.5 (3) MG/3ML IN SOLN: 3.0000 mL | Freq: Four times a day (QID) | RESPIRATORY_TRACT | Status: DC | PRN

## 2016-10-08 MED ORDER — POTASSIUM CHLORIDE CRYS ER 20 MEQ PO TBCR
20.0000 meq | EXTENDED_RELEASE_TABLET | Freq: Every day | ORAL | Status: DC | PRN
  Administered 2016-10-08: 20 meq via ORAL
  Filled 2016-10-08: qty 1

## 2016-10-08 MED ORDER — FUROSEMIDE 10 MG/ML IJ SOLN
20.0000 mg | Freq: Every day | INTRAMUSCULAR | Status: DC
  Administered 2016-10-08 – 2016-10-09 (×2): 20 mg via INTRAVENOUS
  Filled 2016-10-08 (×2): qty 2

## 2016-10-08 NOTE — Evaluation (Addendum)
Clinical/Bedside Swallow Evaluation Patient Details  Name: [name] MRN: [id number] Date of Birth: 03-20-1931  Today's Date: 10/08/2016 Time: SLP Start Time (ACUTE ONLY): 1213 SLP Stop Time (ACUTE ONLY): 1224 SLP Time Calculation (min) (ACUTE ONLY): 11 min  Past Medical History:  Past Medical History:  Diagnosis Date  . CHB (complete heart block) ([place]) 07/13/2013   Pacemaker dependent  . CHF (congestive heart failure) ([place])   . COPD (chronic obstructive pulmonary disease) ([place])   . DM (dermatomyositis)   . Orthostatic hypotension   . Pacemaker 07/13/2013   Her original pacemaker and the current leads were implanted in 1992. She has had 2 generator change out, most recently in 2010. Her device is a Buyer, retail 2110 non RF dual-chamber pacemaker with a battery longevity estimated at about 7 years. The atrial lead is a St. Jude [id number] and the ventricular lead was a Biotronik [id number].   [name] Paroxysmal atrial fibrillation ([place]) 09/04/2014  . Scoliosis   . SSS (sick sinus syndrome) ([place]) 07/13/2013  . Syncope   . Systemic hypertension   . Thoracic kyphosis    Past Surgical History:  Past Surgical History:  Procedure Laterality Date  . NM MYOCAR PERF WALL MOTION  09/13/2011   Low risk  . PERMANENT PACEMAKER GENERATOR CHANGE  03/27/2009   St.Jude  . US ECHOCARDIOGRAPHY  03/28/2012   Mod LAE,mild MR,aortic sclerosis w/mod AI,mod. TR,mild PI,Stage I diastolic dysfunction   HPI:  83 y.o.femalewith medical history of COPD, admitted on 1/18 for Influenza and pneumonia, COPD, DM.,PMR on steroids, chronic dCHF, PAF, s/p pacer for SSS, who presents essentially for shortness of breath and worsening "croupy cough". Per chart dtr states pt has a chronic cough but it sounds more congested now as it does when she has a COPD or CHF flare. Per MD note, Daughter notes that time she does get choked up with food. CXR 10/05/16 revealed RLL pna. CXR 10/07/16 revealed aortic atherosclerosis. Stable  cardiomegaly with central pulmonary vascular congestion. Mild bibasilar atelectasis or edema is noted with minimal associated pleural effusions. No pneumothorax is noted.   Assessment / Plan / Recommendation Clinical Impression  Pt states she may cough if she talks while swallowing but denies a pattern of difficulty. Oral and pharyngeal phases were within functional limits with one delayed throat clear. SLP educated pt to not speak immediately after swallowing, sit upright and small sips. Advised her to notify MD and outpatient MBS can be performed if symptoms worsen. Continue regular texture, thin liquids, no ST follow up needed. Bronchoscopy showed collapsible airways consistent with tracheomalacia.   SLP Visit Diagnosis: Dysphagia, unspecified (R13.10)    Aspiration Risk  Mild aspiration risk    Diet Recommendation Regular;Thin liquid   Liquid Administration via: Cup;Straw Medication Administration: Whole meds with liquid Supervision: Patient able to self feed Compensations: Slow rate;Small sips/bites Postural Changes: Seated upright at 90 degrees    Other  Recommendations Oral Care Recommendations: Oral care BID   Follow up Recommendations None      Frequency and Duration            Prognosis        Swallow Study   General HPI: 83 y.o.femalewith medical history of COPD, admitted on 1/18 for Influenza and pneumonia, COPD, DM.,PMR on steroids, chronic dCHF, PAF, s/p pacer for SSS, who presents essentially for shortness of breath and worsening "croupy cough". Per chart dtr states pt has a chronic cough but it sounds more congested now as it does  when she has a COPD or CHF flare. Per MD note, Daughter notes that time she does get choked up with food. CXR 10/05/16 revealed RLL pna. CXR 10/07/16 revealed aortic atherosclerosis. Stable cardiomegaly with central pulmonary vascular congestion. Mild bibasilar atelectasis or edema is noted with minimal associated pleural effusions. No  pneumothorax is noted. Type of Study: Bedside Swallow Evaluation Previous Swallow Assessment:  (none) Diet Prior to this Study: Regular;Thin liquids Temperature Spikes Noted: No Respiratory Status: Nasal cannula History of Recent Intubation: No Behavior/Cognition: Alert;Cooperative;Pleasant mood Oral Cavity Assessment: Within Functional Limits Oral Care Completed by SLP: No Oral Cavity - Dentition: Dentures, top;Dentures, bottom Vision: Functional for self-feeding Self-Feeding Abilities: Able to feed self Patient Positioning: Upright in bed Baseline Vocal Quality: Normal Volitional Cough: Strong Volitional Swallow: Able to elicit    Oral/Motor/Sensory Function Overall Oral Motor/Sensory Function: Within functional limits   Ice Chips Ice chips: Not tested   Thin Liquid Thin Liquid: Impaired Presentation: Straw;Cup Pharyngeal  Phase Impairments: Throat Clearing - Delayed    Nectar Thick Nectar Thick Liquid: Not tested   Honey Thick Honey Thick Liquid: Not tested   Puree Puree: Within functional limits   Solid   GO   Solid: Within functional limits        Houston Siren 10/08/2016,12:31 PM  Orbie Pyo Colvin Caroli.Ed [employer] [phone]

## 2016-10-08 NOTE — Progress Notes (Signed)
PROGRESS NOTE  [name] NOM:[id number] DOB: [birth year] DOA: 10/05/2016 PCP: Mathews Argyle, MD  HPI/Recap of past 24 hours: Patient is a very nice 83 year old female with past mental history of COPD and chronic diastolic heart failure who had sustained a fall on her right side over a week ago. Since that time, she has had some pain but also increasing shortness of breath, cough and wheeze. She came into the emergency room on 4/18 and a CT scan noted right-sided fractured ribs with extensive consolidation consistent with a pneumonia as well as a narrowing of the right mainstem bronchus possibly mucus, but also given her history of smoking, could be an endobronchial lesion. Pulmonary consulted who offered patient bronchoscopy which would be done on 4/20. Patient started on IV antibiotics and brought into the hospitalist service.  Patient underwent bronchoscopy which noted only mucous plugging and no evidence of endobronchial lesion. Patient overall doing well. She has no complaints. Attempt to ambulate noted still some persistent hypoxia. She also had a 3 pound weight gain since yesterday. Cleared by physical therapy. Cleared by speech therapy.  Assessment/Plan: Principal Problem:   Acute hypoxemic respiratory failure (HCC) secondary to community-acquired pneumonia and acute on chronic diastolic heart failure: Continue oxygen support plus nebulizers and antibiotics. Have added incentive spirometry. No evidence of dysphagia. Cleared by physical therapy. We'll give low-dose Lasix and continue to encourage ambulation Active Problems:   Pacemaker   SSS (sick sinus syndrome) (HCC) status post pacemaker: Stable    Paroxysmal atrial fibrillation (HCC) on chronic anticoagulation: Currently in normal sinus rhythm. Chads 2 score of 6. Eliquis has been restarted.    Systemic hypertension: Continue antihypertensives.  Blood pressure should come down when she is better diuresed    COPD (chronic  obstructive pulmonary disease) ([place]): Continue oxygen plus nebulizers. Discontinue steroids.   Questionable lesion seen on CT: Mucus plugging. Fortunately no endobronchial lesion.    Closed multiple fractures of right upper extremity with ribs with routine healing: We'll notify trauma service. Stable. Continue incentive spirometry use.   Code Status: Full code   Family Communication: Spoke with daughter at bedside  Disposition Plan: Potential discharge tomorrow. Hoping to get off of oxygen   Consultants:  Pulmonary   Procedures:  Bronchoscopy done 4/20: No evidence of endobronchial lesion. Only mucous plugging  Antimicrobials:  IV Rocephin and by mouth Zithromax 4/18-present  DVT prophylaxis: On eliquis   Objective: Vitals:   10/07/16 2112 10/08/16 0626 10/08/16 0929 10/08/16 0930  BP: 134/70 (!) 176/77    Pulse: 75 79    Resp: 18 18    Temp: 98.7 F (37.1 C) 98.9 F (37.2 C)    TempSrc: Oral Oral    SpO2: 94% 98% (!) 85% 93%  Weight:  60.4 kg (133 lb 2.5 oz)    Height:        Intake/Output Summary (Last 24 hours) at 10/08/16 1336 Last data filed at 10/08/16 0900  Gross per 24 hour  Intake              340 ml  Output                0 ml  Net              340 ml   Filed Weights   10/06/16 0539 10/07/16 1136 10/08/16 0626  Weight: 58.8 kg (129 lb 11.5 oz) 58.5 kg (129 lb) 60.4 kg (133 lb 2.5 oz)    Exam:   General:  Alert and oriented 3, no acute distress   Cardiovascular: Regular rate and rhythm, S1-S2   Respiratory: Decreased breath sounds throughout, no crackles or wheezes  Abdomen: Soft, nontender, nondistended, positive bowel sounds   Musculoskeletal: No clubbing or cyanosis or edema   Skin: No skin breaks, tears or lesions.   Psychiatry: Patient is appropriate, no evidence of psychoses    Data Reviewed: CBC:  Recent Labs Lab 10/05/16 0929 10/06/16 0503 10/07/16 0453  WBC 14.4* 14.3* 17.6*  NEUTROABS 12.0*  --   --   HGB 12.4  12.4 12.3  HCT 40.4 39.7 40.3  MCV 88.6 91.3 91.6  PLT 344 300 151   Basic Metabolic Panel:  Recent Labs Lab 10/05/16 0929 10/06/16 0503  NA 141 140  K 3.7 4.1  CL 104 104  CO2 28 27  GLUCOSE 121* 160*  BUN 16 11  CREATININE 0.79 0.62  CALCIUM 8.7* 8.5*   GFR: Estimated Creatinine Clearance: 47.9 mL/min (by C-G formula based on SCr of 0.62 mg/dL). Liver Function Tests:  Recent Labs Lab 10/05/16 0929  AST 19  ALT 10*  ALKPHOS 62  BILITOT 0.8  PROT 6.3*  ALBUMIN 3.2*   No results for input(s): LIPASE, AMYLASE in the last 168 hours. No results for input(s): AMMONIA in the last 168 hours. Coagulation Profile: No results for input(s): INR, PROTIME in the last 168 hours. Cardiac Enzymes:  Recent Labs Lab 10/05/16 0929  TROPONINI 0.03*   BNP (last 3 results) No results for input(s): PROBNP in the last 8760 hours. HbA1C: No results for input(s): HGBA1C in the last 72 hours. CBG: No results for input(s): GLUCAP in the last 168 hours. Lipid Profile: No results for input(s): CHOL, HDL, LDLCALC, TRIG, CHOLHDL, LDLDIRECT in the last 72 hours. Thyroid Function Tests: No results for input(s): TSH, T4TOTAL, FREET4, T3FREE, THYROIDAB in the last 72 hours. Anemia Panel: No results for input(s): VITAMINB12, FOLATE, FERRITIN, TIBC, IRON, RETICCTPCT in the last 72 hours. Urine analysis:    Component Value Date/Time   COLORURINE YELLOW 06/07/2015 0254   APPEARANCEUR CLOUDY (A) 06/07/2015 0254   LABSPEC 1.014 06/07/2015 0254   PHURINE 6.0 06/07/2015 0254   GLUCOSEU 500 (A) 06/07/2015 0254   HGBUR LARGE (A) 06/07/2015 0254   BILIRUBINUR NEGATIVE 06/07/2015 0254   KETONESUR NEGATIVE 06/07/2015 0254   PROTEINUR NEGATIVE 06/07/2015 0254   UROBILINOGEN 1.0 01/19/2011 1030   NITRITE NEGATIVE 06/07/2015 0254   LEUKOCYTESUR NEGATIVE 06/07/2015 0254   Sepsis Labs: _0 (procalcitonin:4,lacticidven:4)  ) Recent Results (from the past 240 hour(s))  Culture,  bal-quantitative     Status: None (Preliminary result)   Collection Time: 10/07/16 12:41 PM  Result Value Ref Range Status   Specimen Description BRONCHIAL WASHINGS RIGHT  Final   Special Requests NONE  Final   Gram Stain   Final    ABUNDANT WBC PRESENT, PREDOMINANTLY PMN RARE GRAM NEGATIVE RODS RARE GRAM POSITIVE COCCI IN PAIRS    Culture   Final    TOO YOUNG TO READ Performed at [facility], [place] [address] [place],  [address]    Report Status PENDING  Incomplete      Studies: No results found.  Scheduled Meds: . ALPRAZolam  0.25 mg Oral QHS  . apixaban  2.5 mg Oral BID  . azithromycin  500 mg Oral Daily  . cholecalciferol  1,000 Units Oral BID  . escitalopram  10 mg Oral Daily  . ferrous sulfate  325 mg Oral Q breakfast  . furosemide  20 mg Intravenous Daily  . guaiFENesin  600 mg Oral BID  . ipratropium-albuterol  3 mL Nebulization TID  . metoprolol succinate  12.5 mg Oral QHS  . metoprolol succinate  25 mg Oral Daily  . mirtazapine  7.5 mg Oral QHS  . sodium chloride flush  3 mL Intravenous Q12H    Continuous Infusions: . sodium chloride    . sodium chloride 10 mL/hr at 10/07/16 1248  . cefTRIAXone (ROCEPHIN)  IV Stopped (10/08/16 0944)     LOS: 3 days     Annita Brod, MD [facility] Pager [phone]  If 7PM-7AM, please contact night-coverage [email or web address] Password [email or web address] 10/08/2016, 1:36 PM

## 2016-10-08 NOTE — Progress Notes (Signed)
SATURATION QUALIFICATIONS: (This note is used to comply with regulatory documentation for home oxygen)  Patient Saturations on Room Air at Rest = 90%  Patient Saturations on Room Air while Ambulating = 85%  Patient Saturations on 2 Liters of oxygen while Ambulating = 93%  Please briefly explain why patient needs home oxygen: 

## 2016-10-08 NOTE — Evaluation (Signed)
Physical Therapy Evaluation Patient Details Name: [name] MRN: [id number] DOB: 1930/12/16 Today's Date: 10/08/2016   History of Present Illness  Pt admitted from home with R PNA s/p fall 9 days ago with multiple fractured ribs.  Pt wtih hx of SSS, A-fib, COPD, CHF, Pacemaker and Complete Heart block  Clinical Impression  Pt admitted as above and presenting with functional mobility limitations 2* generalized weakness and mild ambulatory balance deficits.  Pt desat to 84% ambulating on RA this am but hopes to dc home without O2 and with intermittent assist of daughters.    Follow Up Recommendations No PT follow up    Equipment Recommendations  None recommended by PT    Recommendations for Other Services       Precautions / Restrictions Precautions Precautions: Fall Restrictions Weight Bearing Restrictions: No      Mobility  Bed Mobility Overal bed mobility: Modified Independent             General bed mobility comments: Pt unassisted to EOB  Transfers Overall transfer level: Needs assistance Equipment used: None Transfers: Sit to/from Stand Sit to Stand: Supervision         General transfer comment: mild instability with initial standing but no LOB  Ambulation/Gait Ambulation/Gait assistance: Min guard Ambulation Distance (Feet): 400 Feet Assistive device: None Gait Pattern/deviations: Step-through pattern;Decreased step length - right;Decreased step length - left;Shuffle;Trunk flexed Gait velocity: decr Gait velocity interpretation: Below normal speed for age/gender General Gait Details: Mild instability improving with increased distance ambulated.  Pt with no LOB but desat to 84% on RA with ambulation but recovered to 92% with rest x 63min.  Stairs            Wheelchair Mobility    Modified Rankin (Stroke Patients Only)       Balance Overall balance assessment: Needs assistance Sitting-balance support: No upper extremity supported;Feet  supported Sitting balance-Leahy Scale: Good     Standing balance support: No upper extremity supported Standing balance-Leahy Scale: Fair                               Pertinent Vitals/Pain Pain Assessment: No/denies pain    Home Living Family/patient expects to be discharged to:: Private residence Living Arrangements: Alone Available Help at Discharge: Family;Available PRN/intermittently Type of Home: House Home Access: Stairs to enter;Ramped entrance   Entrance Stairs-Number of Steps: 1 Home Layout: One level Home Equipment: Walker - 2 wheels;Cane - single point;Bedside commode Additional Comments: daughter lives nearby    Prior Function Level of Independence: Independent               Hand Dominance        Extremity/Trunk Assessment   Upper Extremity Assessment Upper Extremity Assessment: Generalized weakness    Lower Extremity Assessment Lower Extremity Assessment: Generalized weakness    Cervical / Trunk Assessment Cervical / Trunk Assessment: Kyphotic  Communication   Communication: No difficulties  Cognition Arousal/Alertness: Awake/alert Behavior During Therapy: WFL for tasks assessed/performed Overall Cognitive Status: Within Functional Limits for tasks assessed                                        General Comments      Exercises     Assessment/Plan    PT Assessment Patient needs continued PT services  PT Problem List Decreased strength;Decreased  activity tolerance;Decreased balance       PT Treatment Interventions DME instruction;Gait training;Stair training;Functional mobility training;Therapeutic activities;Therapeutic exercise;Balance training;Patient/family education    PT Goals (Current goals can be found in the Care Plan section)  Acute Rehab PT Goals Patient Stated Goal: GO home WITHOUT oxygen PT Goal Formulation: With patient Time For Goal Achievement: 10/22/16 Potential to Achieve Goals:  Good    Frequency Min 3X/week   Barriers to discharge        Co-evaluation               End of Session Equipment Utilized During Treatment: Gait belt Activity Tolerance: Patient tolerated treatment well Patient left: in bed;with call bell/phone within reach;with bed alarm set Nurse Communication: Mobility status;Other (comment) (O2 sats to 84% on RA with ambulation) PT Visit Diagnosis: Unsteadiness on feet (R26.81);Difficulty in walking, not elsewhere classified (R26.2)    Time: 9147-8295 PT Time Calculation (min) (ACUTE ONLY): 31 min   Charges:   PT Evaluation $PT Eval Low Complexity: 1 Procedure PT Treatments $Gait Training: 8-22 mins   PT G Codes:        Pg [phone]   Kippy Melena 10/08/2016, 12:26 PM

## 2016-10-09 LAB — BASIC METABOLIC PANEL
ANION GAP: 7 (ref 5–15)
BUN: 25 mg/dL — ABNORMAL HIGH (ref 6–20)
CALCIUM: 8.7 mg/dL — AB (ref 8.9–10.3)
CHLORIDE: 102 mmol/L (ref 101–111)
CO2: 31 mmol/L (ref 22–32)
CREATININE: 0.71 mg/dL (ref 0.44–1.00)
GFR calc non Af Amer: >60 mL/min (ref >60)
Glucose, Bld: 83 mg/dL (ref 65–99)
Potassium: 4.2 mmol/L (ref 3.5–5.1)
SODIUM: 140 mmol/L (ref 135–145)

## 2016-10-09 LAB — CBC
HCT: 40.1 % (ref 36.0–46.0)
Hemoglobin: 12.2 g/dL (ref 12.0–15.0)
MCH: 27.5 pg (ref 26.0–34.0)
MCHC: 30.4 g/dL (ref 30.0–36.0)
MCV: 90.3 fL (ref 78.0–100.0)
Platelets: 369 K/uL (ref 150–400)
RBC: 4.44 MIL/uL (ref 3.87–5.11)
RDW: 17.6 % — ABNORMAL HIGH (ref 11.5–15.5)
WBC: 12.2 K/uL — ABNORMAL HIGH (ref 4.0–10.5)

## 2016-10-09 MED ORDER — FUROSEMIDE 10 MG/ML IJ SOLN
40.0000 mg | Freq: Once | INTRAMUSCULAR | Status: AC
  Administered 2016-10-09: 40 mg via INTRAVENOUS
  Filled 2016-10-09: qty 4

## 2016-10-09 NOTE — Progress Notes (Signed)
SATURATION QUALIFICATIONS: (This note is used to comply with regulatory documentation for home oxygen)  Patient Saturations on Room Air at Rest =91%  Patient Saturations on Room Air while Ambulating = 85%  Patient Saturations on 2 Liters of oxygen while Ambulating = 92%  Please briefly explain why patient needs home oxygen:

## 2016-10-09 NOTE — Progress Notes (Signed)
PROGRESS NOTE  [name] GUY:[id number] DOB: 01-Mar-1931 DOA: 10/05/2016 PCP: Mathews Argyle, MD  HPI/Recap of past 24 hours: Patient is a very nice 83 year old female with past mental history of COPD and chronic diastolic heart failure who had sustained a fall on her right side over a week ago. Since that time, she has had some pain but also increasing shortness of breath, cough and wheeze. She came into the emergency room on 4/18 and a CT scan noted right-sided fractured ribs with extensive consolidation consistent with a pneumonia as well as a narrowing of the right mainstem bronchus possibly mucus, but also given her history of smoking, could be an endobronchial lesion. Pulmonary consulted who offered patient bronchoscopy which would be done on 4/20. Patient started on IV antibiotics and brought into the hospitalist service.  Patient underwent bronchoscopy which noted only mucous plugging and no evidence of endobronchial lesion. Since then patient has been well although she continues to have persistent hypoxia. Noted to have a 3 pound weight gain so started on Lasix of which she has been diuresing well. This morning with ambulation, sats dropped to mid 80s on room air although she says overall she thinks her breathing is better.  Assessment/Plan: Principal Problem:   Acute hypoxemic respiratory failure (HCC) secondary to community-acquired pneumonia and acute on chronic diastolic heart failure: Continue oxygen support plus nebulizers and antibiotics. Have added incentive spirometry. Discontinued steroids. No evidence of dysphagia. Cleared by physical therapy. Continue Lasix and by tomorrow, she should be fully diuresed. Has stopped IV fluids. If she is still hypoxic, it may be just that her lungs need time to recover and she'll have to go home on 2 L nasal cannula.  Finish IV Rocephin today. Active Problems:   Pacemaker   SSS (sick sinus syndrome) ([place]) status post pacemaker:  Stable    Paroxysmal atrial fibrillation (HCC) on chronic anticoagulation: Currently in normal sinus rhythm. Chads 2 score of 6. Eliquis has been restarted.    Systemic hypertension: Continue antihypertensives.  Blood pressure should come down when she is better diuresed     COPD (chronic obstructive pulmonary disease) ([place]): Continue oxygen plus nebulizers. Stop steroids 4/21   Questionable lesion seen on CT: Mucus plugging. Fortunately no endobronchial lesion.    Closed multiple fractures of right upper extremity with ribs with routine healing: Notify trauma service. Stable. Continue incentive spirometry use.   Code Status: Full code   Family Communication: Spoke with daughter by phone  Disposition Plan: Discharge home tomorrow, hopefully off of oxygen   Consultants:  Pulmonary   Procedures:  Bronchoscopy done 4/20: No evidence of endobronchial lesion. Only mucous plugging  Antimicrobials:  IV Rocephin 4/18-4/22   and by mouth Zithromax 4/18-present  DVT prophylaxis: On eliquis   Objective: Vitals:   10/08/16 2045 10/08/16 2205 10/09/16 0000 10/09/16 0523  BP:  138/67  (!) 158/79  Pulse:  73  71  Resp:  18  16  Temp:  98.2 F (36.8 C)  98.2 F (36.8 C)  TempSrc:  Oral  Oral  SpO2: 95% 96% 95% 95%  Weight:    59.9 kg (132 lb 0.9 oz)  Height:        Intake/Output Summary (Last 24 hours) at 10/09/16 1238 Last data filed at 10/09/16 1036  Gross per 24 hour  Intake            629.5 ml  Output              500  ml  Net            129.5 ml   Filed Weights   10/07/16 1136 10/08/16 0626 10/09/16 0523  Weight: 58.5 kg (129 lb) 60.4 kg (133 lb 2.5 oz) 59.9 kg (132 lb 0.9 oz)    Exam:   General:  Alert and oriented 3, no acute distress   Cardiovascular: Regular rate and rhythm, S1-S2   Respiratory: Decreased breath sounds throughout, no crackles or wheezes  Abdomen: Soft, nontender, nondistended, positive bowel sounds   Musculoskeletal: No clubbing  or cyanosis or edema   Skin: No skin breaks, tears or lesions.   Psychiatry: Patient is appropriate, no evidence of psychoses    Data Reviewed: CBC:  Recent Labs Lab 10/05/16 0929 10/06/16 0503 10/07/16 0453 10/09/16 0538  WBC 14.4* 14.3* 17.6* 12.2*  NEUTROABS 12.0*  --   --   --   HGB 12.4 12.4 12.3 12.2  HCT 40.4 39.7 40.3 40.1  MCV 88.6 91.3 91.6 90.3  PLT 344 300 328 875   Basic Metabolic Panel:  Recent Labs Lab 10/05/16 0929 10/06/16 0503 10/09/16 0538  NA 141 140 140  K 3.7 4.1 4.2  CL 104 104 102  CO2 _0 GLUCOSE 121* 160* 83  BUN 16 11 25*  CREATININE 0.79 0.62 0.71  CALCIUM 8.7* 8.5* 8.7*   GFR: Estimated Creatinine Clearance: 47.9 mL/min (by C-G formula based on SCr of 0.71 mg/dL). Liver Function Tests:  Recent Labs Lab 10/05/16 0929  AST 19  ALT 10*  ALKPHOS 62  BILITOT 0.8  PROT 6.3*  ALBUMIN 3.2*   No results for input(s): LIPASE, AMYLASE in the last 168 hours. No results for input(s): AMMONIA in the last 168 hours. Coagulation Profile: No results for input(s): INR, PROTIME in the last 168 hours. Cardiac Enzymes:  Recent Labs Lab 10/05/16 0929  TROPONINI 0.03*   BNP (last 3 results) No results for input(s): PROBNP in the last 8760 hours. HbA1C: No results for input(s): HGBA1C in the last 72 hours. CBG: No results for input(s): GLUCAP in the last 168 hours. Lipid Profile: No results for input(s): CHOL, HDL, LDLCALC, TRIG, CHOLHDL, LDLDIRECT in the last 72 hours. Thyroid Function Tests: No results for input(s): TSH, T4TOTAL, FREET4, T3FREE, THYROIDAB in the last 72 hours. Anemia Panel: No results for input(s): VITAMINB12, FOLATE, FERRITIN, TIBC, IRON, RETICCTPCT in the last 72 hours. Urine analysis:    Component Value Date/Time   COLORURINE YELLOW 06/07/2015 0254   APPEARANCEUR CLOUDY (A) 06/07/2015 0254   LABSPEC 1.014 06/07/2015 0254   PHURINE 6.0 06/07/2015 0254   GLUCOSEU 500 (A) 06/07/2015 0254   HGBUR LARGE  (A) 06/07/2015 0254   BILIRUBINUR NEGATIVE 06/07/2015 0254   KETONESUR NEGATIVE 06/07/2015 0254   PROTEINUR NEGATIVE 06/07/2015 0254   UROBILINOGEN 1.0 01/19/2011 1030   NITRITE NEGATIVE 06/07/2015 0254   LEUKOCYTESUR NEGATIVE 06/07/2015 0254   Sepsis Labs: _1 (procalcitonin:4,lacticidven:4)  ) Recent Results (from the past 240 hour(s))  Culture, bal-quantitative     Status: Abnormal (Preliminary result)   Collection Time: 10/07/16 12:41 PM  Result Value Ref Range Status   Specimen Description BRONCHIAL WASHINGS RIGHT  Final   Special Requests NONE  Final   Gram Stain   Final    ABUNDANT WBC PRESENT, PREDOMINANTLY PMN RARE GRAM NEGATIVE RODS RARE GRAM POSITIVE COCCI IN PAIRS    Culture (A)  Final    60,000 COLONIES/mL STREPTOCOCCUS SALIVARIUS 60,000 COLONIES/mL PSEUDOMONAS AERUGINOSA SUSCEPTIBILITIES TO FOLLOW Performed at [facility]  Hospital Lab, [place] [address], [place], [place] [address]    Report Status PENDING  Incomplete  Acid Fast Smear (AFB)     Status: None   Collection Time: 10/07/16 12:42 PM  Result Value Ref Range Status   AFB Specimen Processing Concentration  Final   Acid Fast Smear Negative  Final    Comment: (NOTE) Performed At: [facility] [place], [place] [id number] Lindon Romp MD UX:[id number]    Source (AFB) BRONCHIAL WASHINGS  Final    Comment: RIGHT      Studies: No results found.  Scheduled Meds: . ALPRAZolam  0.25 mg Oral QHS  . apixaban  2.5 mg Oral BID  . azithromycin  500 mg Oral Daily  . cholecalciferol  1,000 Units Oral BID  . escitalopram  10 mg Oral Daily  . ferrous sulfate  325 mg Oral Q breakfast  . furosemide  20 mg Intravenous Daily  . furosemide  40 mg Intravenous Once  . guaiFENesin  600 mg Oral BID  . ipratropium-albuterol  3 mL Nebulization TID  . metoprolol succinate  12.5 mg Oral QHS  . metoprolol succinate  25 mg Oral Daily  . mirtazapine  7.5 mg Oral QHS  . sodium chloride flush  3  mL Intravenous Q12H    Continuous Infusions: . sodium chloride    . cefTRIAXone (ROCEPHIN)  IV 1 g (10/09/16 1040)     LOS: 4 days     Annita Brod, MD [facility] Pager [phone]  If 7PM-7AM, please contact night-coverage [email or web address] Password [facility] 10/09/2016, 12:38 PM

## 2016-10-10 ENCOUNTER — Encounter (HOSPITAL_COMMUNITY): Payer: Self-pay | Admitting: Pulmonary Disease

## 2016-10-10 ENCOUNTER — Telehealth: Payer: Self-pay

## 2016-10-10 ENCOUNTER — Inpatient Hospital Stay (HOSPITAL_COMMUNITY)
Admission: EM | Admit: 2016-10-10 | Discharge: 2016-10-13 | DRG: 640 | Disposition: A | Discharge status: Skilled Nursing Facility | Payer: Medicare Other | Attending: Internal Medicine | Admitting: Internal Medicine

## 2016-10-10 DIAGNOSIS — Z7901 Long term (current) use of anticoagulants: Secondary | ICD-10-CM

## 2016-10-10 DIAGNOSIS — I442 Atrioventricular block, complete: Secondary | ICD-10-CM | POA: Diagnosis present

## 2016-10-10 DIAGNOSIS — S2231XA Fracture of one rib, right side, initial encounter for closed fracture: Secondary | ICD-10-CM | POA: Diagnosis present

## 2016-10-10 DIAGNOSIS — J449 Chronic obstructive pulmonary disease, unspecified: Secondary | ICD-10-CM | POA: Diagnosis present

## 2016-10-10 DIAGNOSIS — E860 Dehydration: Principal | ICD-10-CM | POA: Diagnosis present

## 2016-10-10 DIAGNOSIS — T502X5A Adverse effect of carbonic-anhydrase inhibitors, benzothiadiazides and other diuretics, initial encounter: Secondary | ICD-10-CM | POA: Diagnosis present

## 2016-10-10 DIAGNOSIS — R55 Syncope and collapse: Secondary | ICD-10-CM | POA: Diagnosis not present

## 2016-10-10 DIAGNOSIS — Z7952 Long term (current) use of systemic steroids: Secondary | ICD-10-CM

## 2016-10-10 DIAGNOSIS — R42 Dizziness and giddiness: Secondary | ICD-10-CM | POA: Diagnosis not present

## 2016-10-10 DIAGNOSIS — I480 Paroxysmal atrial fibrillation: Secondary | ICD-10-CM | POA: Diagnosis present

## 2016-10-10 DIAGNOSIS — M353 Polymyalgia rheumatica: Secondary | ICD-10-CM | POA: Diagnosis present

## 2016-10-10 DIAGNOSIS — F17200 Nicotine dependence, unspecified, uncomplicated: Secondary | ICD-10-CM | POA: Diagnosis present

## 2016-10-10 DIAGNOSIS — M419 Scoliosis, unspecified: Secondary | ICD-10-CM | POA: Diagnosis present

## 2016-10-10 DIAGNOSIS — J440 Chronic obstructive pulmonary disease with acute lower respiratory infection: Secondary | ICD-10-CM | POA: Diagnosis present

## 2016-10-10 DIAGNOSIS — I951 Orthostatic hypotension: Secondary | ICD-10-CM | POA: Diagnosis not present

## 2016-10-10 DIAGNOSIS — I110 Hypertensive heart disease with heart failure: Secondary | ICD-10-CM | POA: Diagnosis present

## 2016-10-10 DIAGNOSIS — I10 Essential (primary) hypertension: Secondary | ICD-10-CM | POA: Diagnosis present

## 2016-10-10 DIAGNOSIS — J151 Pneumonia due to Pseudomonas: Secondary | ICD-10-CM | POA: Diagnosis present

## 2016-10-10 DIAGNOSIS — J9601 Acute respiratory failure with hypoxia: Secondary | ICD-10-CM | POA: Diagnosis not present

## 2016-10-10 DIAGNOSIS — I5032 Chronic diastolic (congestive) heart failure: Secondary | ICD-10-CM | POA: Diagnosis present

## 2016-10-10 DIAGNOSIS — Z950 Presence of cardiac pacemaker: Secondary | ICD-10-CM

## 2016-10-10 DIAGNOSIS — Z79899 Other long term (current) drug therapy: Secondary | ICD-10-CM

## 2016-10-10 DIAGNOSIS — W19XXXA Unspecified fall, initial encounter: Secondary | ICD-10-CM | POA: Diagnosis present

## 2016-10-10 DIAGNOSIS — Z66 Do not resuscitate: Secondary | ICD-10-CM | POA: Diagnosis present

## 2016-10-10 DIAGNOSIS — I495 Sick sinus syndrome: Secondary | ICD-10-CM | POA: Diagnosis present

## 2016-10-10 LAB — CBC WITH DIFFERENTIAL/PLATELET
BASOS ABS: 0 10*3/uL (ref 0.0–0.1)
BASOS PCT: 0 %
Eosinophils Absolute: 0.1 10*3/uL (ref 0.0–0.7)
Eosinophils Relative: 1 %
HEMATOCRIT: 43.1 % (ref 36.0–46.0)
HEMOGLOBIN: 13.5 g/dL (ref 12.0–15.0)
LYMPHS ABS: 1.1 10*3/uL (ref 0.7–4.0)
Lymphocytes Relative: 8 %
MCH: 27.8 pg (ref 26.0–34.0)
MCHC: 31.3 g/dL (ref 30.0–36.0)
MCV: 88.9 fL (ref 78.0–100.0)
Monocytes Absolute: 1.2 10*3/uL — ABNORMAL HIGH (ref 0.1–1.0)
Monocytes Relative: 8 %
NEUTROS ABS: 12.5 10*3/uL — AB (ref 1.7–7.7)
NEUTROS PCT: 83 %
Platelets: 360 10*3/uL (ref 150–400)
RBC: 4.85 MIL/uL (ref 3.87–5.11)
RDW: 17.1 % — ABNORMAL HIGH (ref 11.5–15.5)
WBC: 14.9 10*3/uL — AB (ref 4.0–10.5)

## 2016-10-10 LAB — BASIC METABOLIC PANEL
Anion gap: 11 (ref 5–15)
BUN: 27 mg/dL — AB (ref 6–20)
CALCIUM: 8.4 mg/dL — AB (ref 8.9–10.3)
CHLORIDE: 93 mmol/L — AB (ref 101–111)
CO2: 33 mmol/L — AB (ref 22–32)
CREATININE: 0.95 mg/dL (ref 0.44–1.00)
GFR calc Af Amer: >60 mL/min (ref >60)
GFR calc non Af Amer: 54 mL/min — ABNORMAL LOW (ref >60)
GLUCOSE: 96 mg/dL (ref 65–99)
Potassium: 3.9 mmol/L (ref 3.5–5.1)
Sodium: 137 mmol/L (ref 135–145)

## 2016-10-10 LAB — CULTURE, BAL-QUANTITATIVE W GRAM STAIN

## 2016-10-10 LAB — CULTURE, BAL-QUANTITATIVE

## 2016-10-10 MED ORDER — LEVOFLOXACIN 750 MG PO TABS: 750.0000 mg | ORAL_TABLET | ORAL | 0 refills | Status: DC

## 2016-10-10 MED ORDER — LEVOFLOXACIN 750 MG PO TABS
750.0000 mg | ORAL_TABLET | ORAL | Status: DC
Start: 2016-10-10 — End: 2016-10-10
  Administered 2016-10-10: 750 mg via ORAL
  Filled 2016-10-10: qty 1

## 2016-10-10 NOTE — ED Notes (Signed)
Bed: WA03 Expected date:  Expected time:  Means of arrival:  Comments: 83 yo F/nausea

## 2016-10-10 NOTE — ED Triage Notes (Signed)
Patient bib [facility] from home for dizziness and nausea. Pt discharged from hospital today after falling last Wednesday. Patient sustained 5 broken ribs as well pneumonia. Pt states she began to fill dizzy throughout hospital stay but was approved for discharge. Dizziness became worse since arriving home. Pt unable to get around her home, family concerned pt needs to go to rehab. Pt also c/o nausea, states she is unable to eat but has not vomited and denied zofran from EMS.  Pt on 2L o2 at home.

## 2016-10-10 NOTE — Telephone Encounter (Signed)
-----   Message from Marshell Garfinkel, MD sent at 10/07/2016  5:58 PM EDT ----- Regarding: Follow up Can you make appointment for this pt to see me or NP in clinic in the next 1-2 months.

## 2016-10-10 NOTE — Telephone Encounter (Signed)
Spoke with pt, who ask that I call her call her back in the morning to scheduled her for a hosp f/u. Pt states her daughter will be bringing her to her appointment, and she is currently gone to the store. Will call back on 4/24

## 2016-10-10 NOTE — ED Provider Notes (Signed)
[facility] DEPT Provider Note   CSN: [id number] Arrival date & time: 10/10/16  2108  By signing my name below, I, Jeanell Sparrow, attest that this documentation has been prepared under the direction and in the presence of non-physician practitioner, Abigail Butts, PA-C. Electronically Signed: Jeanell Sparrow, Scribe. 10/10/2016. 11:00 PM.  History   Chief Complaint Chief Complaint  Patient presents with  . Dizziness  . Nausea   The history is provided by the patient, medical records and a relative. No language interpreter was used.   HPI Comments: [name] is a 83 y.o. female with a PMHx of orthostatic hypotension and DM who presents to the Emergency Department complaining of intermittent moderate dizziness that started today. She was discharged today from hospital admission from a fall, where she had 5 fractured ribs and pneumonia. She states she was having dizziness while at the hospital and it worsened when after coming home. She describes the dizziness as a lightheaded sensation and feeling as if she might pass out. She has been not been eating or drinking in the past 2 days due to decreased appetite. Today she had breakfast but could not eat lunch. She reports associated nausea, weakness (generalized), and incoherent speech (intermittent "when describing commands"). She usually has a systolic blood pressure of 80 when standing without any known symptoms. Denies any room-spinning sensation, chest pain, SOB, vision changes, or other complaints at this time.    PCP: Mathews Argyle, MD  Past Medical History:  Diagnosis Date  . CHB (complete heart block) ([place]) 07/13/2013   Pacemaker dependent  . CHF (congestive heart failure) ([place])   . COPD (chronic obstructive pulmonary disease) ([place])   . DM (dermatomyositis)   . Orthostatic hypotension   . Pacemaker 07/13/2013   Her original pacemaker and the current leads were implanted in 1992. She has had 2 generator change out,  most recently in 2010. Her device is a Buyer, retail 2110 non RF dual-chamber pacemaker with a battery longevity estimated at about 7 years. The atrial lead is a St. Jude [id number] and the ventricular lead was a Biotronik [id number].   [name] Paroxysmal atrial fibrillation ([place]) 09/04/2014  . Scoliosis   . SSS (sick sinus syndrome) ([place]) 07/13/2013  . Syncope   . Systemic hypertension   . Thoracic kyphosis     Patient Active Problem List   Diagnosis Date Noted  . Essential hypertension 10/11/2016  . Syncope 10/11/2016  . Acute hypoxemic respiratory failure ([place]) 10/05/2016  . Chronic diastolic CHF (congestive heart failure) ([place]) 10/05/2016  . Smoker 10/05/2016  . Closed multiple fractures of right upper extremity with ribs with routine healing 10/05/2016  . HCAP (healthcare-associated pneumonia) 10/31/2015  . COPD (chronic obstructive pulmonary disease) ([place]) 10/31/2015  . Leukocytosis 10/31/2015  . Benign essential HTN 10/31/2015  . Anxiety state   . Acute respiratory failure with hypoxia ([place]) 08/26/2015  . COPD exacerbation ([place]) 08/26/2015  . Diastolic CHF ([place]) 73/71/0626  . Systemic hypertension 08/26/2015  . Hypokalemia 08/26/2015  . Anticoagulant long-term use 08/26/2015  . DM (dermatomyositis) 08/26/2015  . Acute on chronic diastolic CHF (congestive heart failure), NYHA class 1 ([place])   . Sepsis ([place]) 06/07/2015  . CAP (community acquired pneumonia) 06/07/2015  . Acute CHF (congestive heart failure) ([place]) 06/07/2015  . Paroxysmal atrial fibrillation ([place]) 09/04/2014  . Pacemaker 07/13/2013  . CHB (complete heart block) ([place]) 07/13/2013  . SSS (sick sinus syndrome) ([place]) 07/13/2013  . Orthostatic hypotension 07/13/2013  . Aortic insufficiency 07/13/2013  Past Surgical History:  Procedure Laterality Date  . NM MYOCAR PERF WALL MOTION  09/13/2011   Low risk  . PERMANENT PACEMAKER GENERATOR CHANGE  03/27/2009   St.Jude  . US ECHOCARDIOGRAPHY  03/28/2012   Mod LAE,mild MR,aortic  sclerosis w/mod AI,mod. TR,mild PI,Stage I diastolic dysfunction  . VIDEO BRONCHOSCOPY Bilateral 10/07/2016   Procedure: VIDEO BRONCHOSCOPY WITH FLUORO;  Surgeon: Marshell Garfinkel, MD;  Location: [facility];  Service: Cardiopulmonary;  Laterality: Bilateral;    OB History    No data available       Home Medications    Prior to Admission medications   Medication Sig Start Date End Date Taking? Authorizing Provider  alendronate (FOSAMAX) 70 MG tablet Take 70 mg by mouth every Saturday. Take with a full glass of water on an empty stomach.   Yes Historical Provider, MD  ALPRAZolam (XANAX) 0.25 MG tablet Take 0.25 mg by mouth at bedtime.    Yes Historical Provider, MD  cholecalciferol (VITAMIN D) 1000 units tablet Take 1,000 Units by mouth 2 (two) times daily.   Yes Historical Provider, MD  ELIQUIS 2.5 MG TABS tablet TAKE 1 TABLET(2.5 MG) BY MOUTH TWICE DAILY 09/13/16  Yes Mihai Croitoru, MD  escitalopram (LEXAPRO) 10 MG tablet Take 10 mg by mouth daily.   Yes Historical Provider, MD  ferrous sulfate 325 (65 FE) MG tablet Take 325 mg by mouth daily with breakfast.   Yes Historical Provider, MD  furosemide (LASIX) 40 MG tablet Take 40 mg by mouth daily as needed (for weight gain of greater than 2lbs).   Yes Historical Provider, MD  ipratropium (ATROVENT) 0.06 % nasal spray Place 2 sprays into both nostrils 4 (four) times daily as needed for rhinitis.   Yes Historical Provider, MD  Ipratropium-Albuterol (COMBIVENT RESPIMAT) 20-100 MCG/ACT AERS respimat Inhale 1 puff into the lungs every 6 (six) hours as needed for wheezing or shortness of breath. 08/29/15  Yes Barton Dubois, MD  levofloxacin (LEVAQUIN) 750 MG tablet Take 1 tablet (750 mg total) by mouth every other day. 10/12/16  Yes Annita Brod, MD  metoprolol succinate (TOPROL-XL) 25 MG 24 hr tablet Take 12.5-25 tablets by mouth 2 (two) times daily. Pt takes one tablet in the morning and one-half tablet at bedtime.   Yes Historical Provider, MD   midodrine (PROAMATINE) 2.5 MG tablet Take 2.5 mg by mouth as needed (when systolic BP is lower than 100).    Yes Historical Provider, MD  mirtazapine (REMERON) 15 MG tablet Take 7.5 mg by mouth at bedtime.    Yes Historical Provider, MD  potassium chloride SA (K-DUR,KLOR-CON) 20 MEQ tablet Take 20 mEq by mouth daily as needed (when taking Lasix).    Yes Historical Provider, MD  predniSONE (DELTASONE) 10 MG tablet Take 10-15 mg by mouth daily. Takes 1 tablet one day and 1 and 1/2 the next day.Last dose was 15 mg   Yes Historical Provider, MD    Family History History reviewed. No pertinent family history.  Social History Social History  Substance Use Topics  . Smoking status: Never Smoker  . Smokeless tobacco: Never Used  . Alcohol use No     Allergies   Patient has no known allergies.   Review of Systems Review of Systems  Constitutional: Positive for appetite change.  Eyes: Negative for visual disturbance.  Respiratory: Negative for shortness of breath.   Cardiovascular: Negative for chest pain.  Gastrointestinal: Positive for nausea.  Neurological: Positive for dizziness, speech difficulty, weakness (Generalized) and  light-headedness.  All other systems reviewed and are negative.    Physical Exam Updated Vital Signs BP (!) 115/50 (BP Location: Left Arm)   Pulse 77   Resp (!) 22   SpO2 97%   Physical Exam  Constitutional: She is oriented to person, place, and time. She appears well-developed and well-nourished. No distress.  Awake, alert, nontoxic appearance  HENT:  Head: Normocephalic and atraumatic.  Mouth/Throat: Oropharynx is clear and moist. No oropharyngeal exudate.  Eyes: Conjunctivae and EOM are normal. Pupils are equal, round, and reactive to light. No scleral icterus.  No horizontal, vertical or rotational nystagmus  Neck: Normal range of motion. Neck supple.  Full active and passive ROM without pain No midline or paraspinal tenderness No nuchal  rigidity or meningeal signs  Cardiovascular: Normal rate, regular rhythm and intact distal pulses.   Pulmonary/Chest: Effort normal and breath sounds normal. No respiratory distress. She has no wheezes. She has no rales.  Equal chest expansion  Abdominal: Soft. Bowel sounds are normal. She exhibits no mass. There is no tenderness. There is no rebound and no guarding.  Musculoskeletal: Normal range of motion. She exhibits no edema.  Lymphadenopathy:    She has no cervical adenopathy.  Neurological: She is alert and oriented to person, place, and time. No cranial nerve deficit. She exhibits normal muscle tone. Coordination normal.  Mental Status:  Alert, oriented, thought content appropriate. Speech fluent without evidence of aphasia. Able to follow 2 step commands without difficulty.  Cranial Nerves:  II:  Peripheral visual fields grossly normal, pupils equal, round, reactive to light III,IV, VI: ptosis not present, extra-ocular motions intact bilaterally  V,VII: smile symmetric, facial light touch sensation equal VIII: hearing grossly normal bilaterally  IX,X: midline uvula rise  XI: bilateral shoulder shrug equal and strong XII: midline tongue extension  Motor:  5/5 in upper and lower extremities bilaterally including strong and equal grip strength and dorsiflexion/plantar flexion Sensory: light touch normal in all extremities.  Cerebellar: normal finger-to-nose with bilateral upper extremities Gait: unable to ambulate due to severe weakness and near syncope when standing CV: distal pulses palpable throughout   Skin: Skin is warm and dry. No rash noted. She is not diaphoretic.  Psychiatric: She has a normal mood and affect. Her behavior is normal. Judgment and thought content normal.  Nursing note and vitals reviewed.    ED Treatments / Results  DIAGNOSTIC STUDIES: Oxygen Saturation is 97% on RA, normal by my interpretation.    COORDINATION OF CARE: 11:04 PM- Pt advised of plan  for treatment and pt agrees.  Labs (all labs ordered are listed, but only abnormal results are displayed) Labs Reviewed  CBC WITH DIFFERENTIAL/PLATELET - Abnormal; Notable for the following:       Result Value   WBC 14.9 (*)    RDW 17.1 (*)    Neutro Abs 12.5 (*)    Monocytes Absolute 1.2 (*)    All other components within normal limits  COMPREHENSIVE METABOLIC PANEL - Abnormal; Notable for the following:    Chloride 96 (*)    Glucose, Bld 126 (*)    BUN 26 (*)    Albumin 2.9 (*)    ALT 12 (*)    GFR calc non Af Amer 56 (*)    All other components within normal limits  URINALYSIS, ROUTINE W REFLEX MICROSCOPIC - Abnormal; Notable for the following:    Hgb urine dipstick SMALL (*)    Squamous Epithelial / LPF 0-5 (*)  All other components within normal limits  I-STAT CG4 LACTIC ACID, ED  I-STAT TROPOININ, ED    EKG  EKG Interpretation  Date/Time:  Tuesday October 11 2016 00:12:00 EDT Ventricular Rate:  70 PR Interval:    QRS Duration: 160 QT Interval:  447 QTC Calculation: 483 R Axis:   -87 Text Interpretation:  Afib/flutter and ventricular-paced rhythm No further analysis attempted due to paced rhythm No significant change since last tracing Confirmed by [facility] MD, [place] ([id number]) on 10/11/2016 12:25:40 AM        Procedures Procedures (including critical care time)  Medications Ordered in ED Medications  0.9 %  sodium chloride infusion (not administered)     Initial Impression / Assessment and Plan / ED Course  I have reviewed the triage vital signs and the nursing notes.  Pertinent labs & imaging results that were available during my care of the patient were reviewed by me and considered in my medical decision making (see chart for details).     Pt presents with generalized weakness and near syncope after retuning home from the hospital today.  Suspect orthostatic hypotension 2/2 large doses of lasix while hospitalized.  Confirmed here in the ED with severe  symptomatic hypotension when standing.  Pt unable to walk due to this.  Creatinine normal.  Labs reassuring.    Orthostatic VS for the past 24 hrs:  BP- Lying Pulse- Lying BP- Sitting Pulse- Sitting BP- Standing at 0 minutes Pulse- Standing at 0 minutes  10/11/16 0023 117/59 78 (!) 83/37 72 (!) 54/37 73   Discussed with Dr. Roel Cluck who will admit.    Final Clinical Impressions(s) / ED Diagnoses   Final diagnoses:  Postural dizziness with near syncope  Orthostatic hypotension    New Prescriptions New Prescriptions   No medications on file   I personally performed the services described in this documentation, which was scribed in my presence. The recorded information has been reviewed and is accurate.     Jarrett [place], PA-C 10/11/16 0105    Fatima Blank, MD 10/11/16 [phone]

## 2016-10-10 NOTE — Care Management Important Message (Signed)
Important Message  Patient Details  Name: SHERRON MAPP MRN: [id number] Date of Birth: 03-May-1931   Medicare Important Message Given:  Yes    Kerin Salen 10/10/2016, 11:27 [place] Message  Patient Details  Name: AYNSLEY FLEET MRN: [id number] Date of Birth: Apr 07, 1931   Medicare Important Message Given:  Yes    Kerin Salen 10/10/2016, 11:26 AM

## 2016-10-10 NOTE — Discharge Summary (Signed)
Discharge Summary  [name] YSA:[id number] DOB: 17-May-1931  PCP: Mathews Argyle, MD  Admit date: 10/05/2016 Discharge date: 10/10/2016  Time spent: 25 minutes   Recommendations for Outpatient Follow-up:  1. New medication: Levaquin 750 mg every 48 hours with next dose on 4/25 and final dose on 4/27. 2. Patient will go home on 2 L nasal cannula with all activity.  3. She'll follow-up with her PCP in the next 1-2 weeks and at that time can be assessed for discontinuation if her oxygenation has improved.  Discharge Diagnoses:  Active Hospital Problems   Diagnosis Date Noted  . Acute hypoxemic respiratory failure ([place]) 10/05/2016  . Chronic diastolic CHF (congestive heart failure) ([place]) 10/05/2016  . Smoker 10/05/2016  . Closed multiple fractures of right upper extremity with ribs with routine healing 10/05/2016  . COPD (chronic obstructive pulmonary disease) ([place]) 10/31/2015  . Diastolic CHF ([place]) 32/35/5732  . Systemic hypertension 08/26/2015  . Anticoagulant long-term use 08/26/2015  . CAP (community acquired pneumonia) 06/07/2015  . Paroxysmal atrial fibrillation ([place]) 09/04/2014  . Pacemaker 07/13/2013  . SSS (sick sinus syndrome) ([place]) 07/13/2013  . Orthostatic hypotension 07/13/2013    Resolved Hospital Problems   Diagnosis Date Noted Date Resolved  No resolved problems to display.    Discharge Condition:  Improved, being discharged home  Diet recommendation: heart healthy   Vitals:   10/10/16 0419 10/10/16 1009  BP: 126/60 130/62  Pulse: 74 72  Resp: 20   Temp: 100.1 F (37.8 C)     History of present illness:  Patient is a very nice 83 year old female with past mental history of COPD and chronic diastolic heart failure who had sustained a fall on her right side over a week ago. Since that time, she has had some pain but also increasing shortness of breath, cough and wheeze. She came into the emergency room on 4/18 and a CT scan noted right-sided  fractured ribs with extensive consolidation consistent with a pneumonia as well as a narrowing of the right mainstem bronchus possibly mucus, but also given her history of smoking, could be an endobronchial lesion. Pulmonary consulted who offered patient bronchoscopy which would be done on 4/20. Patient started on IV antibiotics and brought into the hospitalist service.  Hospital Course:  Principal Problem:   Acute hypoxemic respiratory failure (HCC) secondary to community-acquired pneumonia and acute on chronic diastolic heart failure: Patient continued on oxygen 2 alert plus nebulizers and antibiotics. She finished course of Rocephin and Zithromax by day of discharge, 4/23. Tolerating incentive spirometry very well. Fortunately her blood fractures were more posterior and not interfering with good inspiratory effort. Speech therapy cleared her with no signs of dysphagia. Seen by physical therapy who recommended no outpatient follow-up. Patient underwent bronchoscopy which noted only mucus plugging and no evidence of endobronchial lesion.  Patient still continued to be hypoxia. She was noted to have a 3 pound weight gain so started on Lasix which she had been diuresing well. By day of discharge, down 126 pounds down from 130 on admission and as high as 133 on 4/21. Felt to be a dry weight. Still noted to be hypoxic with ambulation, oxygen saturations down to 88% on room air on day of discharge. She will need to go home on oxygen.   Incidentally, it was noted that on her bronchoscopy, cultures grew out for strep viridans and for Pseudomonas. Strep coverage by Rocephin and Zithromax but Pseudomonas better covered by Levaquin. Patient given 750 mg by mouth Levaquin  prior to discharge and she will receive Levaquin 750 every other day for total of 5 days' therapy. Active Problems:   Pacemaker   SSS (sick sinus syndrome) ([place]) status post pacemaker: Stable    Paroxysmal atrial fibrillation (HCC) on chronic  anticoagulation: Currently in normal sinus rhythm. Chads 2 score of 6. Eliquis has been restarted.    Systemic hypertension: Continue antihypertensives.  Blood pressure should come down when she is better diuresed     COPD (chronic obstructive pulmonary disease) ([place]): Continue oxygen plus nebulizers. Finished steroids on 4/21   Questionable lesion seen on CT: Mucus plugging. Fortunately no endobronchial lesion.    Closed multiple fractures of right upper extremity with ribs with routine healing: Notify trauma service. Stable. Continue incentive spirometry use.   Family Communication: Spoke with daughter by phone  Disposition Plan: Discharge home tomorrow, hopefully off of oxygen  Discharge Exam: BP 130/62   Pulse 72   Temp 100.1 F (37.8 C) (Oral)   Resp 20   Ht _0  (1.651 m)   Wt 57.5 kg (126 lb 12.2 oz)   SpO2 97%   BMI 21.09 kg/m   GenAlert and oriented 3, no acute distress  Cardiovascular: regular rate and rhythm, S1-S2  Respiratory: decreased breath sounds throughout   Discharge Instructions You were cared for by a hospitalist during your hospital stay. If you have any questions about your discharge medications or the care you received while you were in the hospital after you are discharged, you can call the unit and asked to speak with the hospitalist on call if the hospitalist that took care of you is not available. Once you are discharged, your primary care physician will handle any further medical issues. Please note that NO REFILLS for any discharge medications will be authorized once you are discharged, as it is imperative that you return to your primary care physician (or establish a relationship with a primary care physician if you do not have one) for your aftercare needs so that they can reassess your need for medications and monitor your lab values.  Discharge Instructions    Diet - low sodium heart healthy    Complete by:  As directed    Increase  activity slowly    Complete by:  As directed      Allergies as of 10/10/2016   No Known Allergies     Medication List    TAKE these medications   alendronate 70 MG tablet Commonly known as:  FOSAMAX Take 70 mg by mouth every Saturday. Take with a full glass of water on an empty stomach.   ALPRAZolam 0.25 MG tablet Commonly known as:  XANAX Take 0.25 mg by mouth at bedtime.   cholecalciferol 1000 units tablet Commonly known as:  VITAMIN D Take 1,000 Units by mouth 2 (two) times daily.   ELIQUIS 2.5 MG Tabs tablet Generic drug:  apixaban TAKE 1 TABLET(2.5 MG) BY MOUTH TWICE DAILY   escitalopram 10 MG tablet Commonly known as:  LEXAPRO Take 10 mg by mouth daily.   ferrous sulfate 325 (65 FE) MG tablet Take 325 mg by mouth daily with breakfast.   furosemide 40 MG tablet Commonly known as:  LASIX Take 40 mg by mouth daily as needed (for weight gain of greater than 2lbs).   ipratropium 0.06 % nasal spray Commonly known as:  ATROVENT Place 2 sprays into both nostrils 4 (four) times daily as needed for rhinitis.   Ipratropium-Albuterol 20-100 MCG/ACT Aers respimat Commonly known  as:  COMBIVENT RESPIMAT Inhale 1 puff into the lungs every 6 (six) hours as needed for wheezing or shortness of breath.   levofloxacin 750 MG tablet Commonly known as:  LEVAQUIN Take 1 tablet (750 mg total) by mouth every other day. Start taking on:  10/12/2016   metoprolol succinate 25 MG 24 hr tablet Commonly known as:  TOPROL-XL Take 12.5-25 tablets by mouth 2 (two) times daily. Pt takes one tablet in the morning and one-half tablet at bedtime.   midodrine 2.5 MG tablet Commonly known as:  PROAMATINE Take 2.5 mg by mouth as needed (when systolic BP is lower than 100).   mirtazapine 15 MG tablet Commonly known as:  REMERON Take 7.5 mg by mouth at bedtime.   potassium chloride SA 20 MEQ tablet Commonly known as:  K-DUR,KLOR-CON Take 20 mEq by mouth daily as needed (when taking Lasix).    predniSONE 10 MG tablet Commonly known as:  DELTASONE Take 10-15 mg by mouth daily. Takes 1 tablet one day and 1 and 1/2 the next day.Last dose was 15 mg            Durable Medical Equipment        Start     Ordered   10/10/16 1149  DME Oxygen  Once    Question Answer Comment  Mode or (Route) Nasal cannula   Liters per Minute 2   Frequency Continuous (stationary and portable oxygen unit needed)   Oxygen delivery system Gas      10/10/16 1148     No Known Allergies Follow-up Information    Mathews Argyle, MD Follow up in 1 week(s).   Specialty:  Internal Medicine Contact information: [address] [place] [address] [phone]            The results of significant diagnostics from this hospitalization (including imaging, microbiology, ancillary and laboratory) are listed below for reference.    Significant Diagnostic Studies: Dg Chest 2 View  Result Date: 10/05/2016 CLINICAL DATA:  Increased shortness of breath. Cough, congestion over the last several days. EXAM: CHEST  2 VIEW COMPARISON:  07/16/2016 FINDINGS: The lungs are hyperinflated likely secondary to COPD. There is a small right pleural effusion. There is right lower lobe airspace disease. The left lung is clear. There is no pneumothorax. There is stable cardiomegaly. There is mild bilateral interstitial prominence. There is a dual lead cardiac pacemaker. The osseous structures are unremarkable. IMPRESSION: 1. Right lower lobe pneumonia. Small right pleural effusion. Followup PA and lateral chest X-ray is recommended in 3-4 weeks following trial of antibiotic therapy to ensure resolution and exclude underlying malignancy. 2. Cardiomegaly with mild pulmonary vascular congestion. Electronically Signed   By: Kathreen Devoid   On: 10/05/2016 09:18   Ct Chest Wo Contrast  Addendum Date: 10/05/2016   ADDENDUM REPORT: 10/05/2016 11:10 ADDENDUM: Study discussed by telephone with Dr. Carmin Muskrat on 10/05/2016 at 1101 hours. Electronically Signed   By: Genevie Ann M.D.   On: 10/05/2016 11:10   Result Date: 10/05/2016 CLINICAL DATA:  83 year old female status post fall 1 week ago with right side chest and rib pain. Abnormal chest radiographs today. EXAM: CT CHEST WITHOUT CONTRAST TECHNIQUE: Multidetector CT imaging of the chest was performed following the standard protocol without IV contrast. COMPARISON:  Chest radiographs 0908 hours today. Lumbar spine CT 05/24/2005 FINDINGS: Cardiovascular: Calcified aorta and coronary artery atherosclerosis. Cardiomegaly. No pericardial effusion. Right chest cardiac pacemaker or AICD. Vascular patency is not evaluated  in the absence of IV contrast. Mediastinum/Nodes: 2 cm subcarinal lymph node, favor reactive (See lung findings below). Other mediastinal lymph nodes are within normal limits. Difficult to delineate any hilar lymph nodes in the absence of IV contrast. Mild gaseous distension of the thoracic esophagus appears inconsequential. Lungs/Pleura: Layering right pleural effusion up to 2.5 cm in thickness with simple fluid density. Dependent retained secretions in the right bronchus intermedius (series 5, image 83). The right middle lobe bronchus is occluded on image 98. The right middle lobe bronchus is also occluded. Other Major airways are patent.  No pneumothorax. Abnormal right lower lobe confluent peribronchial opacity extending from the hilum. Extensive more peripheral right lower lobe nodularity and irregular opacity. In the basilar segments of the right lower lobe there is peribronchial consolidation and confluent irregular opacity. Questionable small areas of cavitation within the distal right lower lobe versus bronchiectasis (series 5, image 123). Superimposed confluent right middle lobe atelectasis. Both upper lobes demonstrates smooth septal thickening. There is superimposed peripheral right upper lobe subpleural reticular opacity. See also right  rib findings below. Trace layering left pleural effusion. Mild mostly dependent opacity in the left lower lobe. Upper Abdomen: Multiple nonspecific hypodense lesions in the visible liver, the largest measuring about 3 cm diameter do have simple fluid density. Negative noncontrast visible spleen, pancreas, adrenal glands, kidneys and bowel in the upper abdomen. Musculoskeletal: Osteopenia. Acute fractures of the right lateral third through seventh ribs. These fractures are nondisplaced except at the third rib on series 5, image 50 where there is mild adjacent intercostal and pleural or extrapleural hematoma. Sternum intact. No vertebral fracture identified. No other acute fracture or left rib fracture identified. Visible shoulder osseous structures appear grossly intact. IMPRESSION: 1. Acute fractures of the right lateral third through seventh ribs. The third rib fracture is mildly displaced with associated small volume adjacent intercostal and pleural or extrapleural hematoma. 2. Opacified or occluded right middle lobe and lower lobe bronchi with widespread abnormal right lower lobe opacity. The appearance does not suggest pulmonary contusion. Severe aspiration and/or pneumonia is possible but the appearance is suspicious for an obstructing bronchogenic carcinoma. Follow-up bronchoscopy may be most valuable at this point and is recommended. 3. Small layering right pleural effusion. Trace layering left pleural effusion. 4. Increased subcarinal mediastinal lymph node, indeterminate (as in #2). 5. Cardiomegaly. Calcified aortic and coronary artery atherosclerosis. 6. Indeterminate but favor benign multiple low-density lesions in the liver. Electronically Signed: By: Genevie Ann M.D. On: 10/05/2016 10:55   Dg Chest Port 1 View  Result Date: 10/07/2016 CLINICAL DATA:  Status post bronchoscopy. EXAM: PORTABLE CHEST 1 VIEW COMPARISON:  Radiographs and CT scan of October 05, 2016. FINDINGS: Stable cardiomegaly with central  pulmonary vascular congestion. Atherosclerosis of thoracic aorta is noted. Right-sided pacemaker is unchanged in position. Minimal bilateral pleural effusions are noted. Bibasilar atelectasis or edema is noted. Bony thorax is unremarkable. No pneumothorax is noted. IMPRESSION: Aortic atherosclerosis. Stable cardiomegaly with central pulmonary vascular congestion. Mild bibasilar atelectasis or edema is noted with minimal associated pleural effusions. No pneumothorax is noted. Electronically Signed   By: Marijo Conception, M.D.   On: 10/07/2016 13:26    Microbiology: Recent Results (from the past 240 hour(s))  Culture, bal-quantitative     Status: Abnormal   Collection Time: 10/07/16 12:41 PM  Result Value Ref Range Status   Specimen Description BRONCHIAL WASHINGS RIGHT  Final   Special Requests NONE  Final   Gram Stain   Final  ABUNDANT WBC PRESENT, PREDOMINANTLY PMN RARE GRAM NEGATIVE RODS RARE GRAM POSITIVE COCCI IN PAIRS Performed at [facility], [place] [address] [place], [place] [address]    Culture (A)  Final    60,000 COLONIES/mL VIRIDANS STREPTOCOCCUS 60,000 COLONIES/mL PSEUDOMONAS AERUGINOSA    Report Status 10/10/2016 FINAL  Final   Organism ID, Bacteria VIRIDANS STREPTOCOCCUS (A)  Final   Organism ID, Bacteria PSEUDOMONAS AERUGINOSA (A)  Final      Susceptibility   Pseudomonas aeruginosa - MIC*    CEFTAZIDIME 4 SENSITIVE Sensitive     CIPROFLOXACIN <=0.25 SENSITIVE Sensitive     GENTAMICIN <=1 SENSITIVE Sensitive     IMIPENEM 2 SENSITIVE Sensitive     PIP/TAZO 8 SENSITIVE Sensitive     CEFEPIME 4 SENSITIVE Sensitive     * 60,000 COLONIES/mL PSEUDOMONAS AERUGINOSA   Viridans streptococcus - MIC*    PENICILLIN 2 INTERMEDIATE Intermediate     CEFTRIAXONE 4 RESISTANT Resistant     ERYTHROMYCIN 2 RESISTANT Resistant     LEVOFLOXACIN 1 SENSITIVE Sensitive     VANCOMYCIN 0.5 SENSITIVE Sensitive     * 60,000 COLONIES/mL VIRIDANS STREPTOCOCCUS  Acid Fast Smear (AFB)      Status: None   Collection Time: 10/07/16 12:42 PM  Result Value Ref Range Status   AFB Specimen Processing Concentration  Final   Acid Fast Smear Negative  Final    Comment: (NOTE) Performed At: [facility] [place], [place] [id number] Lindon Romp MD WI:[id number]    Source (AFB) BRONCHIAL WASHINGS  Final    Comment: RIGHT     Labs: Basic Metabolic Panel:  Recent Labs Lab 10/05/16 0929 10/06/16 0503 10/09/16 0538 10/10/16 0459  NA 141 140 140 137  K 3.7 4.1 4.2 3.9  CL 104 104 102 93*  CO2 _0 33*  GLUCOSE 121* 160* 83 96  BUN 16 11 25* 27*  CREATININE 0.79 0.62 0.71 0.95  CALCIUM 8.7* 8.5* 8.7* 8.4*   Liver Function Tests:  Recent Labs Lab 10/05/16 0929  AST 19  ALT 10*  ALKPHOS 62  BILITOT 0.8  PROT 6.3*  ALBUMIN 3.2*   No results for input(s): LIPASE, AMYLASE in the last 168 hours. No results for input(s): AMMONIA in the last 168 hours. CBC:  Recent Labs Lab 10/05/16 0929 10/06/16 0503 10/07/16 0453 10/09/16 0538  WBC 14.4* 14.3* 17.6* 12.2*  NEUTROABS 12.0*  --   --   --   HGB 12.4 12.4 12.3 12.2  HCT 40.4 39.7 40.3 40.1  MCV 88.6 91.3 91.6 90.3  PLT 344 300 328 369   Cardiac Enzymes:  Recent Labs Lab 10/05/16 0929  TROPONINI 0.03*   BNP: BNP (last 3 results)  Recent Labs  07/15/16 0957 10/05/16 0930 10/08/16 1741  BNP 536.6* 544.3* 675.1*    ProBNP (last 3 results) No results for input(s): PROBNP in the last 8760 hours.  CBG: No results for input(s): GLUCAP in the last 168 hours.     Signed:  Annita Brod, MD [facility] 10/10/2016, 2:54 PM

## 2016-10-11 ENCOUNTER — Observation Stay (HOSPITAL_BASED_OUTPATIENT_CLINIC_OR_DEPARTMENT_OTHER): Payer: Medicare Other

## 2016-10-11 ENCOUNTER — Encounter (HOSPITAL_COMMUNITY): Payer: Self-pay | Admitting: Internal Medicine

## 2016-10-11 DIAGNOSIS — S2231XA Fracture of one rib, right side, initial encounter for closed fracture: Secondary | ICD-10-CM | POA: Diagnosis not present

## 2016-10-11 DIAGNOSIS — I4891 Unspecified atrial fibrillation: Secondary | ICD-10-CM

## 2016-10-11 DIAGNOSIS — I110 Hypertensive heart disease with heart failure: Secondary | ICD-10-CM | POA: Diagnosis not present

## 2016-10-11 DIAGNOSIS — Z950 Presence of cardiac pacemaker: Secondary | ICD-10-CM | POA: Diagnosis not present

## 2016-10-11 DIAGNOSIS — J9601 Acute respiratory failure with hypoxia: Secondary | ICD-10-CM | POA: Diagnosis not present

## 2016-10-11 DIAGNOSIS — Z7901 Long term (current) use of anticoagulants: Secondary | ICD-10-CM | POA: Diagnosis not present

## 2016-10-11 DIAGNOSIS — I10 Essential (primary) hypertension: Secondary | ICD-10-CM | POA: Diagnosis present

## 2016-10-11 DIAGNOSIS — Z66 Do not resuscitate: Secondary | ICD-10-CM | POA: Diagnosis not present

## 2016-10-11 DIAGNOSIS — R55 Syncope and collapse: Secondary | ICD-10-CM | POA: Diagnosis not present

## 2016-10-11 DIAGNOSIS — R42 Dizziness and giddiness: Secondary | ICD-10-CM | POA: Diagnosis not present

## 2016-10-11 DIAGNOSIS — Z79899 Other long term (current) drug therapy: Secondary | ICD-10-CM | POA: Diagnosis not present

## 2016-10-11 DIAGNOSIS — J449 Chronic obstructive pulmonary disease, unspecified: Secondary | ICD-10-CM | POA: Diagnosis not present

## 2016-10-11 DIAGNOSIS — F17200 Nicotine dependence, unspecified, uncomplicated: Secondary | ICD-10-CM | POA: Diagnosis not present

## 2016-10-11 DIAGNOSIS — J151 Pneumonia due to Pseudomonas: Secondary | ICD-10-CM | POA: Diagnosis not present

## 2016-10-11 DIAGNOSIS — M419 Scoliosis, unspecified: Secondary | ICD-10-CM | POA: Diagnosis present

## 2016-10-11 DIAGNOSIS — I5032 Chronic diastolic (congestive) heart failure: Secondary | ICD-10-CM | POA: Diagnosis not present

## 2016-10-11 DIAGNOSIS — M353 Polymyalgia rheumatica: Secondary | ICD-10-CM | POA: Diagnosis present

## 2016-10-11 DIAGNOSIS — W19XXXA Unspecified fall, initial encounter: Secondary | ICD-10-CM | POA: Diagnosis present

## 2016-10-11 DIAGNOSIS — I495 Sick sinus syndrome: Secondary | ICD-10-CM | POA: Diagnosis not present

## 2016-10-11 DIAGNOSIS — E860 Dehydration: Secondary | ICD-10-CM | POA: Diagnosis not present

## 2016-10-11 DIAGNOSIS — I442 Atrioventricular block, complete: Secondary | ICD-10-CM | POA: Diagnosis not present

## 2016-10-11 DIAGNOSIS — I951 Orthostatic hypotension: Secondary | ICD-10-CM | POA: Diagnosis not present

## 2016-10-11 DIAGNOSIS — T502X5A Adverse effect of carbonic-anhydrase inhibitors, benzothiadiazides and other diuretics, initial encounter: Secondary | ICD-10-CM | POA: Diagnosis present

## 2016-10-11 DIAGNOSIS — J440 Chronic obstructive pulmonary disease with acute lower respiratory infection: Secondary | ICD-10-CM | POA: Diagnosis not present

## 2016-10-11 DIAGNOSIS — I480 Paroxysmal atrial fibrillation: Secondary | ICD-10-CM | POA: Diagnosis not present

## 2016-10-11 DIAGNOSIS — Z7952 Long term (current) use of systemic steroids: Secondary | ICD-10-CM | POA: Diagnosis not present

## 2016-10-11 DIAGNOSIS — J438 Other emphysema: Secondary | ICD-10-CM | POA: Diagnosis not present

## 2016-10-11 LAB — CBC WITH DIFFERENTIAL/PLATELET
BASOS ABS: 0 10*3/uL (ref 0.0–0.1)
Basophils Relative: 0 %
EOS ABS: 0.2 10*3/uL (ref 0.0–0.7)
EOS PCT: 2 %
HCT: 41.7 % (ref 36.0–46.0)
HEMOGLOBIN: 12.6 g/dL (ref 12.0–15.0)
LYMPHS ABS: 1.3 10*3/uL (ref 0.7–4.0)
Lymphocytes Relative: 11 %
MCH: 26.8 pg (ref 26.0–34.0)
MCHC: 30.2 g/dL (ref 30.0–36.0)
MCV: 88.5 fL (ref 78.0–100.0)
Monocytes Absolute: 0.9 10*3/uL (ref 0.1–1.0)
Monocytes Relative: 8 %
NEUTROS PCT: 79 %
Neutro Abs: 9.1 10*3/uL — ABNORMAL HIGH (ref 1.7–7.7)
PLATELETS: 341 10*3/uL (ref 150–400)
RBC: 4.71 MIL/uL (ref 3.87–5.11)
RDW: 17 % — ABNORMAL HIGH (ref 11.5–15.5)
WBC: 11.5 10*3/uL — ABNORMAL HIGH (ref 4.0–10.5)

## 2016-10-11 LAB — BASIC METABOLIC PANEL
Anion gap: 7 (ref 5–15)
BUN: 24 mg/dL — AB (ref 6–20)
CALCIUM: 8.5 mg/dL — AB (ref 8.9–10.3)
CO2: 33 mmol/L — ABNORMAL HIGH (ref 22–32)
CREATININE: 0.87 mg/dL (ref 0.44–1.00)
Chloride: 97 mmol/L — ABNORMAL LOW (ref 101–111)
GFR calc Af Amer: >60 mL/min (ref >60)
GFR, EST NON AFRICAN AMERICAN: 60 mL/min — AB (ref >60)
GLUCOSE: 116 mg/dL — AB (ref 65–99)
Potassium: 3.9 mmol/L (ref 3.5–5.1)
SODIUM: 137 mmol/L (ref 135–145)

## 2016-10-11 LAB — URINALYSIS, ROUTINE W REFLEX MICROSCOPIC
Bacteria, UA: NONE SEEN
Bilirubin Urine: NEGATIVE
GLUCOSE, UA: NEGATIVE mg/dL
KETONES UR: NEGATIVE mg/dL
LEUKOCYTES UA: NEGATIVE
NITRITE: NEGATIVE
PH: 6 (ref 5.0–8.0)
Protein, ur: NEGATIVE mg/dL
SPECIFIC GRAVITY, URINE: 1.016 (ref 1.005–1.030)

## 2016-10-11 LAB — TROPONIN I
TROPONIN I: 0.03 ng/mL — AB (ref <0.03)
TROPONIN I: 0.03 ng/mL — AB (ref <0.03)

## 2016-10-11 LAB — TSH: TSH: 0.032 u[IU]/mL — AB (ref 0.350–4.500)

## 2016-10-11 LAB — ECHOCARDIOGRAM COMPLETE
HEIGHTINCHES: 65 in
Weight: 2080 oz

## 2016-10-11 LAB — COMPREHENSIVE METABOLIC PANEL
ALT: 12 U/L — ABNORMAL LOW (ref 14–54)
AST: 16 U/L (ref 15–41)
Albumin: 2.9 g/dL — ABNORMAL LOW (ref 3.5–5.0)
Alkaline Phosphatase: 79 U/L (ref 38–126)
Anion gap: 8 (ref 5–15)
BUN: 26 mg/dL — ABNORMAL HIGH (ref 6–20)
CALCIUM: 8.9 mg/dL (ref 8.9–10.3)
CHLORIDE: 96 mmol/L — AB (ref 101–111)
CO2: 32 mmol/L (ref 22–32)
Creatinine, Ser: 0.92 mg/dL (ref 0.44–1.00)
GFR, EST NON AFRICAN AMERICAN: 56 mL/min — AB (ref >60)
Glucose, Bld: 126 mg/dL — ABNORMAL HIGH (ref 65–99)
Potassium: 4 mmol/L (ref 3.5–5.1)
SODIUM: 136 mmol/L (ref 135–145)
Total Bilirubin: 0.7 mg/dL (ref 0.3–1.2)
Total Protein: 6.6 g/dL (ref 6.5–8.1)

## 2016-10-11 LAB — PREALBUMIN: Prealbumin: 12.5 mg/dL — ABNORMAL LOW (ref 18–38)

## 2016-10-11 LAB — I-STAT TROPONIN, ED: TROPONIN I, POC: 0.01 ng/mL (ref 0.00–0.08)

## 2016-10-11 LAB — PHOSPHORUS: PHOSPHORUS: 3.4 mg/dL (ref 2.5–4.6)

## 2016-10-11 LAB — I-STAT CG4 LACTIC ACID, ED: Lactic Acid, Venous: 1.04 mmol/L (ref 0.5–1.9)

## 2016-10-11 LAB — MAGNESIUM: Magnesium: 1.8 mg/dL (ref 1.7–2.4)

## 2016-10-11 MED ORDER — SODIUM CHLORIDE 0.9 % IV SOLN: Freq: Once | INTRAVENOUS | Status: DC

## 2016-10-11 MED ORDER — ALPRAZOLAM 0.25 MG PO TABS
0.2500 mg | ORAL_TABLET | Freq: Once | ORAL | Status: AC
  Administered 2016-10-11: 0.25 mg via ORAL
  Filled 2016-10-11: qty 1

## 2016-10-11 MED ORDER — GUAIFENESIN ER 600 MG PO TB12
600.0000 mg | ORAL_TABLET | Freq: Two times a day (BID) | ORAL | Status: DC
  Administered 2016-10-11 – 2016-10-13 (×5): 600 mg via ORAL
  Filled 2016-10-11 (×5): qty 1

## 2016-10-11 MED ORDER — ESCITALOPRAM OXALATE 10 MG PO TABS
10.0000 mg | ORAL_TABLET | Freq: Every day | ORAL | Status: DC
Start: 2016-10-11 — End: 2016-10-13
  Administered 2016-10-11 – 2016-10-13 (×3): 10 mg via ORAL
  Filled 2016-10-11 (×3): qty 1

## 2016-10-11 MED ORDER — SODIUM CHLORIDE 0.9 % IV SOLN
INTRAVENOUS | Status: DC
  Administered 2016-10-11 – 2016-10-12 (×2): via INTRAVENOUS

## 2016-10-11 MED ORDER — LEVOFLOXACIN 750 MG PO TABS
750.0000 mg | ORAL_TABLET | ORAL | Status: DC
  Administered 2016-10-12: 750 mg via ORAL
  Filled 2016-10-11 (×2): qty 1

## 2016-10-11 MED ORDER — ALBUTEROL SULFATE (2.5 MG/3ML) 0.083% IN NEBU: 2.5000 mg | INHALATION_SOLUTION | RESPIRATORY_TRACT | Status: DC | PRN

## 2016-10-11 MED ORDER — MIRTAZAPINE 15 MG PO TABS
7.5000 mg | ORAL_TABLET | Freq: Every day | ORAL | Status: DC
  Administered 2016-10-11 – 2016-10-12 (×2): 7.5 mg via ORAL
  Filled 2016-10-11 (×2): qty 1

## 2016-10-11 MED ORDER — METOPROLOL SUCCINATE ER 25 MG PO TB24
12.5000 mg | ORAL_TABLET | Freq: Two times a day (BID) | ORAL | Status: DC
  Administered 2016-10-11 – 2016-10-13 (×5): 12.5 mg via ORAL
  Filled 2016-10-11 (×5): qty 1

## 2016-10-11 MED ORDER — SODIUM CHLORIDE 0.9% FLUSH
3.0000 mL | Freq: Two times a day (BID) | INTRAVENOUS | Status: DC
  Administered 2016-10-11 – 2016-10-12 (×3): 3 mL via INTRAVENOUS

## 2016-10-11 MED ORDER — APIXABAN 2.5 MG PO TABS
2.5000 mg | ORAL_TABLET | Freq: Two times a day (BID) | ORAL | Status: DC
  Administered 2016-10-11 – 2016-10-13 (×5): 2.5 mg via ORAL
  Filled 2016-10-11 (×5): qty 1

## 2016-10-11 MED ORDER — SENNA 8.6 MG PO TABS
1.0000 | ORAL_TABLET | Freq: Two times a day (BID) | ORAL | Status: DC
  Administered 2016-10-11 – 2016-10-13 (×5): 8.6 mg via ORAL
  Filled 2016-10-11 (×5): qty 1

## 2016-10-11 MED ORDER — MIDODRINE HCL 2.5 MG PO TABS
2.5000 mg | ORAL_TABLET | ORAL | Status: DC | PRN
  Filled 2016-10-11: qty 1

## 2016-10-11 MED ORDER — POLYETHYLENE GLYCOL 3350 17 G PO PACK
17.0000 g | PACK | Freq: Every day | ORAL | Status: DC | PRN
Start: 2016-10-11 — End: 2016-10-13

## 2016-10-11 MED ORDER — IPRATROPIUM-ALBUTEROL 0.5-2.5 (3) MG/3ML IN SOLN
3.0000 mL | Freq: Four times a day (QID) | RESPIRATORY_TRACT | Status: DC
  Administered 2016-10-12 – 2016-10-13 (×6): 3 mL via RESPIRATORY_TRACT
  Filled 2016-10-11 (×6): qty 3

## 2016-10-11 MED ORDER — ORAL CARE MOUTH RINSE
15.0000 mL | Freq: Two times a day (BID) | OROMUCOSAL | Status: DC
  Administered 2016-10-11 – 2016-10-12 (×3): 15 mL via OROMUCOSAL

## 2016-10-11 MED ORDER — IPRATROPIUM-ALBUTEROL 0.5-2.5 (3) MG/3ML IN SOLN
3.0000 mL | Freq: Four times a day (QID) | RESPIRATORY_TRACT | Status: DC
  Administered 2016-10-11 (×3): 3 mL via RESPIRATORY_TRACT
  Filled 2016-10-11 (×3): qty 3

## 2016-10-11 MED ORDER — PREDNISONE 5 MG PO TABS
15.0000 mg | ORAL_TABLET | Freq: Every day | ORAL | Status: DC
  Administered 2016-10-11 – 2016-10-13 (×3): 15 mg via ORAL
  Filled 2016-10-11 (×3): qty 3

## 2016-10-11 MED ORDER — ALPRAZOLAM 0.25 MG PO TABS
0.2500 mg | ORAL_TABLET | Freq: Every day | ORAL | Status: DC
  Administered 2016-10-11 – 2016-10-12 (×2): 0.25 mg via ORAL
  Filled 2016-10-11 (×2): qty 1

## 2016-10-11 NOTE — Telephone Encounter (Signed)
Daughter [name])  returned phone call, contact # [phone].Mearl Latin

## 2016-10-11 NOTE — Progress Notes (Signed)
  Echocardiogram 2D Echocardiogram has been performed.  [name] 10/11/2016, 12:31 PM

## 2016-10-11 NOTE — Evaluation (Signed)
Occupational Therapy Evaluation Patient Details Name: [name] MRN: [id number] DOB: 1931-06-08 Today's Date: 10/11/2016    History of Present Illness 83 yo female admitted with dizziness, presyncope. Recent history of R sided rib fractures. Hx of SSS, Afib, COPD, CHF, pacemaker.    Clinical Impression   Pt admitted with weakness . Pt currently with functional limitations due to the deficits listed below (see OT Problem List).  Pt will benefit from skilled OT to increase their safety and independence with ADL and functional mobility for ADL to facilitate discharge to venue listed below.      Follow Up Recommendations  Home health OT;Supervision/Assistance - 24 hour    Equipment Recommendations  None recommended by OT    Recommendations for Other Services       Precautions / Restrictions Precautions Precautions: Fall Precaution Comments: monitor O2 sats      Mobility Bed Mobility Overal bed mobility: Needs Assistance Bed Mobility: Supine to Sit     Supine to sit: Min guard;HOB elevated        Transfers Overall transfer level: Needs assistance   Transfers: Sit to/from Stand;Stand Pivot Transfers Sit to Stand: Min assist Stand pivot transfers: Min assist       General transfer comment: Assist to rise, stabilize. Stand pivot, bed to bsc.     Balance Overall balance assessment: Needs assistance;History of Falls           Standing balance-Leahy Scale: Fair                             ADL either performed or assessed with clinical judgement   ADL Overall ADL's : Needs assistance/impaired                 Upper Body Dressing : Set up;Sitting   Lower Body Dressing: Sit to/from stand;Minimal assistance;Cueing for safety;Cueing for sequencing   Toilet Transfer: Minimal assistance;BSC;RW;Comfort height toilet   Toileting- Clothing Manipulation and Hygiene: Minimal assistance;Sit to/from stand       Functional mobility during ADLs:  Minimal assistance;Rolling walker;Cueing for safety;Cueing for sequencing       Vision         Perception     Praxis      Pertinent Vitals/Pain Pain Score: 4  Faces Pain Scale: Hurts a little bit Pain Location:  R rib area Pain Descriptors / Indicators: Discomfort;Sore Pain Intervention(s): Monitored during session;Repositioned     Hand Dominance     Extremity/Trunk Assessment Upper Extremity Assessment Upper Extremity Assessment: Generalized weakness           Communication Communication Communication: No difficulties   Cognition Arousal/Alertness: Awake/alert Behavior During Therapy: WFL for tasks assessed/performed Overall Cognitive Status: Within Functional Limits for tasks assessed                                     General Comments       Exercises     Shoulder Instructions      Home Living Family/patient expects to be discharged to:: Other (Comment) (pt going to ALF for 1 month and willl have [facility] therapy) Living Arrangements: Alone Available Help at Discharge: Family Type of Home: House Home Access: Stairs to enter;Ramped entrance Entrance Stairs-Number of Steps: 1   Home Layout: One level     Bathroom Shower/Tub: Occupational psychologist: Handicapped height  Home Equipment: Byars - 2 wheels;Cane - single point;Bedside commode   Additional Comments: daughter lives nearby      Prior Functioning/Environment Level of Independence: Independent                 OT Problem List: Decreased activity tolerance;Pain;Decreased strength;Decreased knowledge of use of DME or AE      OT Treatment/Interventions: Self-care/ADL training;Patient/family education    OT Goals(Current goals can be found in the care plan section) Acute Rehab OT Goals Patient Stated Goal: to go home OT Goal Formulation: With patient Time For Goal Achievement: 10/25/16  OT Frequency: Min 2X/week   Barriers to D/C:             Co-evaluation              End of Session Equipment Utilized During Treatment: Surveyor, mining Communication: Mobility status  Activity Tolerance: Patient tolerated treatment well Patient left: in chair  OT Visit Diagnosis: Unsteadiness on feet (R26.81);Muscle weakness (generalized) (M62.81)                Time: 1941-7408 OT Time Calculation (min): 35 min Charges:  OT General Charges $OT Visit: 1 Procedure OT Evaluation $OT Eval Moderate Complexity: 1 Procedure OT Treatments $Self Care/Home Management : 8-22 mins G-Codes:       Betsy Pries 10/11/2016, 3:57 PM

## 2016-10-11 NOTE — Progress Notes (Signed)
Patient admitted earlier today by Dr. Toy Baker. See H&P for details.   83 year old female with history of atrial fibrillation, diastolic heart failure, recent hospitalization for acute hypoxemic respiratory failure with community-acquired pneumonia, acute on chronic diastolic heart failure. Patient was recently discharged on 10/10/2016. She presented with orthostatic vital signs, dehydration. Patient was noted to have PT see her on her last admission and did not need nursing facility placement. However discussed with patient as well as daughter at bedside, given patient's deconditioning, patient may benefit from brief stay at skilled nursing facility. PT did see the patient today and recommended SNF versus home health PT with aid pending if patient wishes to return home. Patient currently on gentle IV fluid hydration. Currently on Levaquin for completion of pneumonia treatment.  Time spent: 25 minutes  Ezequiel Macauley D.O. [facility] Pager [phone]  If 7PM-7AM, please contact night-coverage [email or web address] Password [facility] 10/11/2016, 2:55 PM

## 2016-10-11 NOTE — Progress Notes (Signed)
CRITICAL VALUE ALERT  Critical value received:  Troponin 0.03  Date of notification:  10/11/16  Time of notification: 0651  Critical value read back:Yes.    Nurse who received alert:  Rich Fuchs   MD notified (1st page): Baltazar Najjar  Time of first page:  [phone]  MD notified (2nd page):  Time of second page:  Responding MD:    Time MD responded:

## 2016-10-11 NOTE — Telephone Encounter (Signed)
lmtcb x1 for pt. 

## 2016-10-11 NOTE — Clinical Social Work Note (Signed)
Clinical Social Work Assessment  Patient Details  Name: [name] MRN: [id number] Date of Birth: [birth year]  Date of referral:  10/11/16               Reason for consult:  Facility Placement                Permission sought to share information with:  Family Supports Permission granted to share information::  Yes, Verbal Permission Granted  Name::     Jill Side  Agency::     Relationship::  Daughter  Contact Information:  [phone]  Housing/Transportation Living arrangements for the past 2 months:  [place] of Information:  Patient, Adult Children (Daughter) Patient Interpreter Needed:  None Criminal Activity/Legal Involvement Pertinent to Current Situation/Hospitalization:  No - Comment as needed Significant Relationships:  Adult Children Lives with:  Self Do you feel safe going back to the place where you live?  No Need for family participation in patient care:  Yes (Comment)  Care giving concerns:  Patient resides alone and daughter concerned about patient's safety living alone. Patient's daughter has concerns with patient walking around her oxygen machine and the tube from the oxygen machine going down the hall.    Social Worker assessment / plan:  CSW spoke with patient and patient's daughter, patient alert and oriented x 4. Patient's daughter reported that they already have a plan and that [facility] staff will be coming to assess patient today. Patient's daughter reported that patient would prefer to be at an ALF vs SNF due to the environment. Patient's daughter reported that if patient is not able to go to ALF then they would consider additional options. Patient's daughter reported that the ALF is able to have someone come to facility to do PT with patient. Patient agreeable to going ALF for ST rehab. Patient reports that she has friends that are at the ALF. CSW will follow up with staff from ALF to coordinate discharge for patient.   Employment  status:  Retired Forensic scientist:  Information systems manager, Programmer, applications PT Recommendations:  [place] / Referral to community resources:  [place]  Patient/Family's Response to care:  Patient and patient's daughter agreeable to patient going to ALF for [place] rehab.  Patient/Family's Understanding of and Emotional Response to Diagnosis, Current Treatment, and Prognosis:  Patient and patient's daughter pleasant when speaking with CSW. Patient's daughter reported that the plan is for patient to go to [facility] rehab and return back to her home.   Emotional Assessment Appearance:  Appears stated age Attitude/Demeanor/Rapport:  Other (Open) Affect (typically observed):  Pleasant Orientation:  Oriented to Self, Oriented to Place, Oriented to  Time, Oriented to Situation Alcohol / Substance use:  Not Applicable Psych involvement (Current and /or in the community):  No (Comment)  Discharge Needs  Concerns to be addressed:  Care Coordination Readmission within the last 30 days:  Yes Current discharge risk:  None Barriers to Discharge:  Continued Medical Work up   [employer], LCSW 10/11/2016, 2:16 PM

## 2016-10-11 NOTE — Evaluation (Signed)
Physical Therapy Evaluation Patient Details Name: SHARILYN GEISINGER MRN: [id number] DOB: 15-Jan-1931 Today's Date: 10/11/2016   History of Present Illness  83 yo female admitted with dizziness, presyncope. Recent history of R sided rib fractures. Hx of SSS, Afib, COPD, CHF, pacemaker.   Clinical Impression  On eval, pt required Min assist for mobility. She walked ~200 feet with use of a RW. O2 sats dropped to 88% on RA during ambulation. Pt presents with general weakness, decreased activity tolerance, and impaired gait and balance. Discussed d/c plan-pt would like to return home if possible. There is some concern about whether she can safely manage at home alone. If pt decides to return home, she would benefit from [place] and a [place]. Will follow.     Follow Up Recommendations SNF ([facility] if pt decides to return home)    Equipment Recommendations  None recommended by PT    Recommendations for Other Services       Precautions / Restrictions Precautions Precautions: Fall Precaution Comments: monitor O2 sats Restrictions Weight Bearing Restrictions: No      Mobility  Bed Mobility Overal bed mobility: Needs Assistance Bed Mobility: Supine to Sit     Supine to sit: Min guard;HOB elevated     General bed mobility comments: Increased time. Pt relied on bedrail.   Transfers Overall transfer level: Needs assistance   Transfers: Sit to/from Stand;Stand Pivot Transfers Sit to Stand: Min assist Stand pivot transfers: Min assist       General transfer comment: Assist to rise, stabilize. Stand pivot, bed to bsc.   Ambulation/Gait Ambulation/Gait assistance: Min assist Ambulation Distance (Feet): 200 Feet Assistive device: Rolling walker (2 wheeled) Gait Pattern/deviations: Step-through pattern     General Gait Details: Began ambulation without an assistive device, pt unable to safely ambulate without holding on to therapist. Transitioned to  RW use before entering hallway. Min guard assist with use of RW. O2 sats 88% on RA. Pt denied lightheadedness/dizziness.   Stairs            Wheelchair Mobility    Modified Rankin (Stroke Patients Only)       Balance Overall balance assessment: Needs assistance;History of Falls           Standing balance-Leahy Scale: Fair                               Pertinent Vitals/Pain Pain Assessment: Faces Faces Pain Scale: Hurts little more Pain Location: R side when getting in/out of bed Pain Descriptors / Indicators: Grimacing;Guarding Pain Intervention(s): Limited activity within patient's tolerance;Repositioned    Home Living Family/patient expects to be discharged to:: Private residence Living Arrangements: Alone Available Help at Discharge: Family Type of Home: House Home Access: Stairs to enter;Ramped entrance   Entrance Stairs-Number of Steps: 1 Home Layout: One level Home Equipment: Environmental consultant - 2 wheels;Cane - single point;Bedside commode Additional Comments: daughter lives nearby    Prior Function Level of Independence: Independent               Hand Dominance        Extremity/Trunk Assessment   Upper Extremity Assessment Upper Extremity Assessment: Generalized weakness    Lower Extremity Assessment Lower Extremity Assessment: Generalized weakness    Cervical / Trunk Assessment Cervical / Trunk Assessment: Kyphotic  Communication   Communication: No difficulties  Cognition Arousal/Alertness: Awake/alert Behavior During Therapy: WFL for tasks assessed/performed  Overall Cognitive Status: Within Functional Limits for tasks assessed                                        General Comments      Exercises     Assessment/Plan    PT Assessment Patient needs continued PT services  PT Problem List Decreased strength;Decreased mobility;Decreased balance;Decreased activity tolerance;Decreased knowledge of use of  DME;Pain       PT Treatment Interventions Gait training;Therapeutic exercise;DME instruction;Therapeutic activities;Patient/family education;Balance training;Functional mobility training    PT Goals (Current goals can be found in the Care Plan section)  Acute Rehab PT Goals Patient Stated Goal: to go home PT Goal Formulation: With patient Time For Goal Achievement: 10/25/16 Potential to Achieve Goals: Good    Frequency Min 3X/week   Barriers to discharge Decreased caregiver support      Co-evaluation               End of Session Equipment Utilized During Treatment: Gait belt Activity Tolerance: Patient tolerated treatment well Patient left: in chair;with call bell/phone within reach;with chair alarm set   PT Visit Diagnosis: Muscle weakness (generalized) (M62.81);Difficulty in walking, not elsewhere classified (R26.2)    Time: 1101-1131 PT Time Calculation (min) (ACUTE ONLY): 30 min   Charges:   PT Evaluation $PT Eval Low Complexity: 1 Procedure PT Treatments $Gait Training: 8-22 mins   PT G Codes:   PT G-Codes **NOT FOR INPATIENT CLASS** Functional Assessment Tool Used: AM-PAC 6 Clicks Basic Mobility;Clinical judgement Functional Limitation: Mobility: Walking and moving around Mobility: Walking and Moving Around Current Status (Q6578): At least 20 percent but less than 40 percent impaired, limited or restricted Mobility: Walking and Moving Around Goal Status [phone]): At least 1 percent but less than 20 percent impaired, limited or restricted      Weston Anna, MPT Pager: [phone]

## 2016-10-11 NOTE — Progress Notes (Signed)
Nutrition Brief Note  RD consulted for nutrition assessment.  Patient currently consuming 75% of meals at this time. Ate toast, fruit cup, oranges, and grits for breakfast this morning (500 kcal, 5 g protein). Pt was eating 75-100% of meals in ED. Pt has had steady weigh gain over the past year.  Wt Readings from Last 15 Encounters:  10/11/16 130 lb (59 kg)  10/10/16 126 lb 12.2 oz (57.5 kg)  07/25/16 126 lb (57.2 kg)  07/18/16 129 lb 4.8 oz (58.7 kg)  02/04/16 122 lb 9.6 oz (55.6 kg)  01/07/16 121 lb 12.8 oz (55.2 kg)  11/23/15 120 lb 6.4 oz (54.6 kg)  11/12/15 115 lb 9.6 oz (52.4 kg)  11/04/15 121 lb (54.9 kg)  11/02/15 120 lb 3.2 oz (54.5 kg)  10/15/15 114 lb 9.6 oz (52 kg)  09/22/15 116 lb (52.6 kg)  09/17/15 115 lb (52.2 kg)  09/07/15 107 lb (48.5 kg)  08/29/15 112 lb 14.4 oz (51.2 kg)    Body mass index is 21.63 kg/m. Patient meets criteria for normal based on current BMI.   Current diet order is Heart Healthy, patient is consuming approximately 75% of meals at this time. Labs and medications reviewed.   No nutrition interventions warranted at this time. If nutrition issues arise, please consult RD.   Clayton Bibles, MS, RD, LDN Pager: [phone] After Hours Pager: [phone]

## 2016-10-11 NOTE — Telephone Encounter (Signed)
Spoke with pt's daughter [name], states that pt is currently back in the hospital and will call back to schedule a HFU when pt is re-released.  Will close encounter.

## 2016-10-11 NOTE — ED Notes (Signed)
Patient very dizzy upon standing for orthostatic vs. Pt had to lean on nurse tech for support.

## 2016-10-11 NOTE — Progress Notes (Signed)
CSW contacted patient's daughter to follow up. Patient's daughter reported that the assessment went well and the plan is for patient to discharge to [facility] and receive PT/OT at their facility. CSW will follow up with ALF and assist patient with discharge to ALF, when medically stable.   [name], [place] Social Worker [facility] Cell#: [phone]

## 2016-10-11 NOTE — H&P (Signed)
[name] SMO:[id number] DOB: 1931/02/20 DOA: 10/10/2016     PCP: Mathews Argyle, MD   Outpatient Specialists: Cardiology [place] Patient coming from:  home Lives alone,        Chief Complaint: pre-syncope  HPI: [name] is a 83 y.o. female with medical history significant of complete heart block, sinus node dysfunction and pacemaker   paroxysmal atrial fibrillation, diastolic heart failure and mild aortic insufficiency, on chronic prednisone for polymyalgia rheumatica, COPD  Presented with lightheadedness and occasional nausea patient was just discharged from hospital today she reports that while hospitalized she's been feeling lightheaded but it seemed to have improved prior to discharge. Once she arrived home she her lightheadedness has gotten worse she was unable to get around her home been having intermittent nausea and not able to eat she had not vomited anything. She reports found hospital she hasn't eaten much for past 2 days. Patient has history and past orthostatic hypotension states that her blood pressure usually goes to 80s when she stands up she hadn't had any chest pain or shortness of breath She was discharged on 2 L of oxygen  The beginning of April patient had a fall onto her right side and since then she's been having problems with worsening shortness of breath she was admitted on 18th and was found to have right-sided fractured ribs and extensive consolidation consistent with pneumonia as well as possible diastolic CHF exacerbation. Secondary to 3 pound weight gain Patient was treated with IV antibiotics and diuresed. At the time of discharge her weight was down to 126 pounds down from 1:30 in the admission she was noted to be seizing the hypoxic with ambulation with oxygen saturation down to 88% room air and was needing to go home on oxygen. He had a bronchoscopy done on April 20 cultures grew out strep viridans and for Pseudomonas Patient given 750 mg by  mouth Levaquin prior to discharge and she will receive Levaquin 750 every other day for total of 5 days' therapy. A she finished steroids on 21st of April  Regarding pertinent Chronic problems: hx of A.fib on Eliquis and has not had any bleeding problems or neurological events.  Her original pacemaker and the current leads were implanted in 1992. She has had 2 generator change out procedures, most recently in 2010. Her device is a Buyer, retail 2110 non RF dual-chamber pacemaker. The atrial lead is a St. Jude [id number] and the ventricular lead was a Biotronik [id number].   IN ER:  Temp (24hrs), Avg:100.2 F (37.9 C), Min:100.1 F (37.8 C), Max:100.2 F (37.9 C)   RR 22 pulse 76 BP 139/69 laying down Lactic acid 1.04 troponin 0.01 WBC 14.9 which is close to her baseline hemoglobin 13.5 which is up from yesterday suggestive of hemoconcentration Sodium 136 potassium 4.0 BUN 26 creatinine 0.9 to at her baseline cr 0.70  Alb 2.9 Orthostatics was checked in emergency department patient's blood pressure went in to 50 systolics and she stood up and she was unable to remain standing  Following Medications were ordered in ER: Medications - No data to display    Hospitalist was called for admission for dehydration likely secondary to over diuresis and poor by mouth intake  Review of Systems:    Pertinent positives include: fatigue and lightheadedness, nausea,  Constitutional:  No weight loss, night sweats, Fevers, chills, , weight loss  HEENT:  No headaches, Difficulty swallowing,Tooth/dental problems,Sore throat,  No sneezing, itching, ear ache, nasal congestion, post  nasal drip,  Cardio-vascular:  No chest pain, Orthopnea, PND, anasarca, dizziness, palpitations.no Bilateral lower extremity swelling  GI:  No heartburn, indigestion, abdominal pain,  vomiting, diarrhea, change in bowel habits, loss of appetite, melena, blood in stool, hematemesis Resp:  no shortness of breath at rest. No  dyspnea on exertion, No excess mucus, no productive cough, No non-productive cough, No coughing up of blood.No change in color of mucus.No wheezing. Skin:  no rash or lesions. No jaundice GU:  no dysuria, change in color of urine, no urgency or frequency. No straining to urinate.  No flank pain.  Musculoskeletal:  No joint pain or no joint swelling. No decreased range of motion. No back pain.  Psych:  No change in mood or affect. No depression or anxiety. No memory loss.  Neuro: no localizing neurological complaints, no tingling, no weakness, no double vision, no gait abnormality, no slurred speech, no confusion  As per HPI otherwise 10 point review of systems negative.   Past Medical History: Past Medical History:  Diagnosis Date  . CHB (complete heart block) ([place]) 07/13/2013   Pacemaker dependent  . CHF (congestive heart failure) ([place])   . COPD (chronic obstructive pulmonary disease) ([place])   . DM (dermatomyositis)   . Orthostatic hypotension   . Pacemaker 07/13/2013   Her original pacemaker and the current leads were implanted in 1992. She has had 2 generator change out, most recently in 2010. Her device is a Buyer, retail 2110 non RF dual-chamber pacemaker with a battery longevity estimated at about 7 years. The atrial lead is a St. Jude 8119 and the ventricular lead was a Biotronik [id number].   [name] Paroxysmal atrial fibrillation ([place]) 09/04/2014  . Scoliosis   . SSS (sick sinus syndrome) ([place]) 07/13/2013  . Syncope   . Systemic hypertension   . Thoracic kyphosis    Past Surgical History:  Procedure Laterality Date  . NM MYOCAR PERF WALL MOTION  09/13/2011   Low risk  . PERMANENT PACEMAKER GENERATOR CHANGE  03/27/2009   St.Jude  . US ECHOCARDIOGRAPHY  03/28/2012   Mod LAE,mild MR,aortic sclerosis w/mod AI,mod. TR,mild PI,Stage I diastolic dysfunction  . VIDEO BRONCHOSCOPY Bilateral 10/07/2016   Procedure: VIDEO BRONCHOSCOPY WITH FLUORO;  Surgeon: Marshell Garfinkel, MD;  Location: [facility]  ENDOSCOPY;  Service: Cardiopulmonary;  Laterality: Bilateral;     Social History:  Ambulatory  Independently at baseline but lately needs assist   reports that she has never smoked. She has never used smokeless tobacco. She reports that she does not drink alcohol or use drugs.  Allergies:  No Known Allergies     Family History:   History reviewed. No pertinent family history.  Medications: Prior to Admission medications   Medication Sig Start Date End Date Taking? Authorizing Provider  alendronate (FOSAMAX) 70 MG tablet Take 70 mg by mouth every Saturday. Take with a full glass of water on an empty stomach.   Yes Historical Provider, MD  ALPRAZolam (XANAX) 0.25 MG tablet Take 0.25 mg by mouth at bedtime.    Yes Historical Provider, MD  cholecalciferol (VITAMIN D) 1000 units tablet Take 1,000 Units by mouth 2 (two) times daily.   Yes Historical Provider, MD  ELIQUIS 2.5 MG TABS tablet TAKE 1 TABLET(2.5 MG) BY MOUTH TWICE DAILY 09/13/16  Yes Mihai Croitoru, MD  escitalopram (LEXAPRO) 10 MG tablet Take 10 mg by mouth daily.   Yes Historical Provider, MD  ferrous sulfate 325 (65 FE) MG tablet Take 325 mg by mouth daily  with breakfast.   Yes Historical Provider, MD  furosemide (LASIX) 40 MG tablet Take 40 mg by mouth daily as needed (for weight gain of greater than 2lbs).   Yes Historical Provider, MD  ipratropium (ATROVENT) 0.06 % nasal spray Place 2 sprays into both nostrils 4 (four) times daily as needed for rhinitis.   Yes Historical Provider, MD  Ipratropium-Albuterol (COMBIVENT RESPIMAT) 20-100 MCG/ACT AERS respimat Inhale 1 puff into the lungs every 6 (six) hours as needed for wheezing or shortness of breath. 08/29/15  Yes Barton Dubois, MD  levofloxacin (LEVAQUIN) 750 MG tablet Take 1 tablet (750 mg total) by mouth every other day. 10/12/16  Yes Annita Brod, MD  metoprolol succinate (TOPROL-XL) 25 MG 24 hr tablet Take 12.5-25 tablets by mouth 2 (two) times daily. Pt takes one  tablet in the morning and one-half tablet at bedtime.   Yes Historical Provider, MD  midodrine (PROAMATINE) 2.5 MG tablet Take 2.5 mg by mouth as needed (when systolic BP is lower than 100).    Yes Historical Provider, MD  mirtazapine (REMERON) 15 MG tablet Take 7.5 mg by mouth at bedtime.    Yes Historical Provider, MD  potassium chloride SA (K-DUR,KLOR-CON) 20 MEQ tablet Take 20 mEq by mouth daily as needed (when taking Lasix).    Yes Historical Provider, MD  predniSONE (DELTASONE) 10 MG tablet Take 10-15 mg by mouth daily. Takes 1 tablet one day and 1 and 1/2 the next day.Last dose was 15 mg   Yes Historical Provider, MD    Physical Exam: Patient Vitals for the past 24 hrs:  BP Temp Temp src Pulse Resp SpO2 Height Weight  10/11/16 0045 139/69 - - 76 (!) 22 93 % _0  (1.651 m) 59 kg (130 lb)  10/11/16 0043 - 100.2 F (37.9 C) Rectal - - - - -  10/11/16 0022 (!) 117/59 - - 78 16 93 % - -  10/10/16 2212 (!) 115/50 - - 77 (!) 22 97 % - -  10/10/16 2123 - - - - - 94 % - -    1. General:  in No Acute distress 2. Psychological: Alert and  Oriented 3. Head/ENT:     Dry Mucous Membranes                          Head Non traumatic, neck supple                            Poor Dentition 4. SKIN:   decreased Skin turgor,  Skin clean Dry and intact no rash 5. Heart: Regular rate and rhythm  Murmur, Rub or gallop 6. Lungs:  no wheezes some crackles   7. Abdomen: Soft,  non-tender, Non distended 8. Lower extremities: no clubbing, cyanosis, or edema 9. Neurologically   strength 5 out of 5 in all 4 extremities cranial nerves II through XII intact 10. MSK: Normal range of motion   body mass index is 21.63 kg/m.  Labs on Admission:   Labs on Admission: I have personally reviewed following labs and imaging studies  CBC:  Recent Labs Lab 10/05/16 0929 10/06/16 0503 10/07/16 0453 10/09/16 0538 10/10/16 2334  WBC 14.4* 14.3* 17.6* 12.2* 14.9*  NEUTROABS 12.0*  --   --   --  12.5*  HGB  12.4 12.4 12.3 12.2 13.5  HCT 40.4 39.7 40.3 40.1 43.1  MCV 88.6 91.3 91.6 90.3 88.9  PLT 344 300 328 369 161   Basic Metabolic Panel:  Recent Labs Lab 10/05/16 0929 10/06/16 0503 10/09/16 0538 10/10/16 0459 10/10/16 2334  NA 141 140 140 137 136  K 3.7 4.1 4.2 3.9 4.0  CL 104 104 102 93* 96*  CO2 _0 33* 32  GLUCOSE 121* 160* 83 96 126*  BUN 16 11 25* 27* 26*  CREATININE 0.79 0.62 0.71 0.95 0.92  CALCIUM 8.7* 8.5* 8.7* 8.4* 8.9   GFR: Estimated Creatinine Clearance: 41.7 mL/min (by C-G formula based on SCr of 0.92 mg/dL). Liver Function Tests:  Recent Labs Lab 10/05/16 0929 10/10/16 2334  AST 19 16  ALT 10* 12*  ALKPHOS 62 79  BILITOT 0.8 0.7  PROT 6.3* 6.6  ALBUMIN 3.2* 2.9*   No results for input(s): LIPASE, AMYLASE in the last 168 hours. No results for input(s): AMMONIA in the last 168 hours. Coagulation Profile: No results for input(s): INR, PROTIME in the last 168 hours. Cardiac Enzymes:  Recent Labs Lab 10/05/16 0929  TROPONINI 0.03*   BNP (last 3 results) No results for input(s): PROBNP in the last 8760 hours. HbA1C: No results for input(s): HGBA1C in the last 72 hours. CBG: No results for input(s): GLUCAP in the last 168 hours. Lipid Profile: No results for input(s): CHOL, HDL, LDLCALC, TRIG, CHOLHDL, LDLDIRECT in the last 72 hours. Thyroid Function Tests: No results for input(s): TSH, T4TOTAL, FREET4, T3FREE, THYROIDAB in the last 72 hours. Anemia Panel: No results for input(s): VITAMINB12, FOLATE, FERRITIN, TIBC, IRON, RETICCTPCT in the last 72 hours. Urine analysis:    Component Value Date/Time   COLORURINE YELLOW 10/11/2016 0033   APPEARANCEUR CLEAR 10/11/2016 0033   LABSPEC 1.016 10/11/2016 0033   PHURINE 6.0 10/11/2016 0033   GLUCOSEU NEGATIVE 10/11/2016 0033   HGBUR SMALL (A) 10/11/2016 0033   BILIRUBINUR NEGATIVE 10/11/2016 0033   KETONESUR NEGATIVE 10/11/2016 0033   PROTEINUR NEGATIVE 10/11/2016 0033   UROBILINOGEN 1.0  01/19/2011 1030   NITRITE NEGATIVE 10/11/2016 0033   LEUKOCYTESUR NEGATIVE 10/11/2016 0033   Sepsis Labs: _1 (procalcitonin:4,lacticidven:4) ) Recent Results (from the past 240 hour(s))  Culture, bal-quantitative     Status: Abnormal   Collection Time: 10/07/16 12:41 PM  Result Value Ref Range Status   Specimen Description BRONCHIAL WASHINGS RIGHT  Final   Special Requests NONE  Final   Gram Stain   Final    ABUNDANT WBC PRESENT, PREDOMINANTLY PMN RARE GRAM NEGATIVE RODS RARE GRAM POSITIVE COCCI IN PAIRS Performed at [facility], [place] [address], [place], [place] [address]    Culture (A)  Final    60,000 COLONIES/mL VIRIDANS STREPTOCOCCUS 60,000 COLONIES/mL PSEUDOMONAS AERUGINOSA    Report Status 10/10/2016 FINAL  Final   Organism ID, Bacteria VIRIDANS STREPTOCOCCUS (A)  Final   Organism ID, Bacteria PSEUDOMONAS AERUGINOSA (A)  Final      Susceptibility   Pseudomonas aeruginosa - MIC*    CEFTAZIDIME 4 SENSITIVE Sensitive     CIPROFLOXACIN <=0.25 SENSITIVE Sensitive     GENTAMICIN <=1 SENSITIVE Sensitive     IMIPENEM 2 SENSITIVE Sensitive     PIP/TAZO 8 SENSITIVE Sensitive     CEFEPIME 4 SENSITIVE Sensitive     * 60,000 COLONIES/mL PSEUDOMONAS AERUGINOSA   Viridans streptococcus - MIC*    PENICILLIN 2 INTERMEDIATE Intermediate     CEFTRIAXONE 4 RESISTANT Resistant     ERYTHROMYCIN 2 RESISTANT Resistant     LEVOFLOXACIN 1 SENSITIVE Sensitive     VANCOMYCIN 0.5 SENSITIVE Sensitive     *  60,000 COLONIES/mL VIRIDANS STREPTOCOCCUS  Acid Fast Smear (AFB)     Status: None   Collection Time: 10/07/16 12:42 PM  Result Value Ref Range Status   AFB Specimen Processing Concentration  Final   Acid Fast Smear Negative  Final    Comment: (NOTE) Performed At: [facility] [place], [place] [id number] Lindon Romp MD GY:[id number]    Source (AFB) BRONCHIAL WASHINGS  Final    Comment: RIGHT     UA no evidence of UTI     No results found  for: HGBA1C  Estimated Creatinine Clearance: 41.7 mL/min (by C-G formula based on SCr of 0.92 mg/dL).  BNP (last 3 results) No results for input(s): PROBNP in the last 8760 hours.   ECG REPORT  Independently reviewed Rate: 70  Rhythm: Paced ST&T Change: NA    Filed Weights   10/11/16 0045  Weight: 59 kg (130 lb)     Cultures:    Component Value Date/Time   SDES BRONCHIAL WASHINGS RIGHT 10/07/2016 1241   SPECREQUEST NONE 10/07/2016 1241   CULT (A) 10/07/2016 1241    60,000 COLONIES/mL VIRIDANS STREPTOCOCCUS 60,000 COLONIES/mL PSEUDOMONAS AERUGINOSA    REPTSTATUS 10/10/2016 FINAL 10/07/2016 1241     Radiological Exams on Admission: No results found.  Chart has been reviewed    Assessment/Plan   83 y.o. female with medical history significant of complete heart block, sinus node dysfunction and pacemaker   paroxysmal atrial fibrillation, diastolic heart failure and mild aortic insufficiency, on chronic prednisone for polymyalgia rheumatica, COPD admitted with dehydration and symptomatic orthostatis due to recent diuresis and poor PO intake.    Present on Admission: Postural dizziness with presyncope- most likely secondary to over diuresis will hold Lasix give gentle fluids and monitor probably would benefit from PT OT evaluation, restart midodrine, strict I and O's  daily orthostatics . Chronic diastolic CHF (congestive heart failure) ([place]) last echogram was in March 2017 showing preserved EF grade 1 diastolic dysfunction . COPD (chronic obstructive pulmonary disease) ([facility]) - continue DuoNeb albuterol when necessary Mucinex continue 2 L of oxygen flutter valve Sensitivities reviewed continue Levaquin for both coverage of pseudomonas and strep viridans no evidence of sepsis at this point . Orthostatic hypotension patient is pacemaker dependent with history of diastolic CHF likely sensitive to diuresis. Will hold Lasix  give gentle fluids   and monitor . Paroxysmal atrial  fibrillation ([facility]) -           - CHA2DS2 vas score6 : continue current anticoagulation with  Eliquis            -  Rate controled:  Currently controlled with  Toprolol  will continue but given hypotension will start tomorrow slightly decreased dose and holding parameters for just as needed   Recent pneumonia continue Levaquin QOD will need to start on Wednesday. . SSS (sick sinus syndrome) ([facility]) sp pacemaker . Essential hypertension  - continue Toprol BP well controlled Poor by mouth intake will have nutrition evaluate check prealbumin  Other plan as per orders.  DVT prophylaxis:  eliquis  Code Status:    DNR/DNI   as per patient    Family Communication:   Family   at  Bedside  plan of care was discussed with   Daughter   Disposition Plan:    possily will need placement for rehabilitation  Would benefit from PT/OT eval prior to DC  ordered                      Nutrition   consulted                          Consults called: none   Admission status:  obs   Level of care     tele        I have spent a total of 56 min on this admission  Kendan Cornforth 10/11/2016, 1:55 AM    [facility]  Pager [phone]   after 2 AM please page floor coverage PA If 7AM-7PM, please contact the day team taking care of the patient  [email or web address]  Password [email or web address]

## 2016-10-12 ENCOUNTER — Telehealth: Payer: Self-pay | Admitting: Cardiology

## 2016-10-12 DIAGNOSIS — R55 Syncope and collapse: Secondary | ICD-10-CM

## 2016-10-12 DIAGNOSIS — I10 Essential (primary) hypertension: Secondary | ICD-10-CM

## 2016-10-12 DIAGNOSIS — J438 Other emphysema: Secondary | ICD-10-CM

## 2016-10-12 DIAGNOSIS — R42 Dizziness and giddiness: Secondary | ICD-10-CM

## 2016-10-12 DIAGNOSIS — Z7901 Long term (current) use of anticoagulants: Secondary | ICD-10-CM

## 2016-10-12 DIAGNOSIS — I951 Orthostatic hypotension: Secondary | ICD-10-CM

## 2016-10-12 DIAGNOSIS — I5032 Chronic diastolic (congestive) heart failure: Secondary | ICD-10-CM

## 2016-10-12 DIAGNOSIS — I480 Paroxysmal atrial fibrillation: Secondary | ICD-10-CM

## 2016-10-12 DIAGNOSIS — I495 Sick sinus syndrome: Secondary | ICD-10-CM

## 2016-10-12 NOTE — Progress Notes (Signed)
CSW assisting with d/c planning. Spoke with [facility] from [place] [phone]) this afternoon. [name] reports that ALF bed will be ready tomorrow for pt if stable for d/c. CSW will continue to follow to assist with d/c planning.  Werner Lean LCSW [phone]

## 2016-10-12 NOTE — Telephone Encounter (Signed)
Pt daughter called back and stated that pt never received results from 10-03-16 remote transmission that Device Tech RN told her to send. Informed pt that Device Tech RN was with another pt and will call her with these results later today. Pt daughter verbalized understanding.

## 2016-10-12 NOTE — Progress Notes (Signed)
PROGRESS NOTE  [name] SAY:[id number] DOB: 1931-04-12 DOA: 10/10/2016 PCP: Mathews Argyle, MD  Brief History:  83 year old female with a history of complete heart block status post PPM, atrial fibrillation, diastolic CHF, polymyalgia rheumatica on chronic prednisone, and COPD presenting with nausea and lightheadedness. Notably, the patient was recently hospitalized on 10/05/2016 through April 20 13,018. During that hospitalization, the patient was treated for respiratory failure secondary to pneumonia and CHF. The patient was diuresed, and her discharge weight was 126 pounds. The patient had a bronchoscopy during that hospitalization secondary to mucous plugging. BAL cultures revealed pseudomonas aeruginosa and shipped coccus viridans. The patient was discharged with levofloxacin. Unfortunately, the patient had worsening dizziness and nausea after returning home. As a result, the patient represented to the emergency department for further evaluation. At the time of readmission, the patient was noted to have positive orthostatic vital signs. Her furosemide was held, and the patient was started on intravenous fluids.  Assessment/Plan:  orthostatic hypotension/presyncope  -Secondary to volume depletion from diuresis  -Improved with fluid resuscitation  -Repeat orthostatic vital signs negative  -Continue to hold furosemide  -saline lock IVF  Chronic diastolic CHF -Appears clinically euvolemic -10/11/2016 echo EF 45-50 percent, apical HK, PASP 33 -daily weights -dry weight 126-129  Acute respiratory failure with hypoxia -Presently stable on 3 L -Secondary to pneumonia and CHF -Pulmonary hygiene -Continue bronchodilators  Lobar pneumonia -Continue levofloxacin with last dose on 10/16/16 -first dose of levoflox was on 4/23 -10/07/2016 BAL culture with Pseudomonas and Strept viridans  COPD -Patient continues to smoke -Continue bronchodilators  Paroxysmal atrial  fibrillation  -Continue apixiban  -CHADS-VASc = 6  Right rib fracture -pt had recent fall prior to last admission -pain control -pulmonary hygiene -flutter valve  Hypertension -Continue metoprolol succinate  Sick sinus syndrome -Status post permanent pacemaker     Disposition Plan:   SNF on 4/26 if stable Family Communication:   Daughter updated on phone 4/25--Total time spent 35 minutes.  Greater than 50% spent face to face counseling and coordinating care.   Consultants:  none  Code Status:  FULL   DVT Prophylaxis:  apixaban   Procedures: As Listed in Progress Note Above  Antibiotics: Levofloxacin 4/23>>>    Subjective:  patient is feeling better. She is not feeling dizzy anymore. She is feeling stronger today. Denies any fevers, chills, chest pain, nausea, vomiting, diarrhea, abdominal pain. She has some dyspnea on exertion.   Objective: Vitals:   10/12/16 0946 10/12/16 1045 10/12/16 1426 10/12/16 1752  BP:  (!) 118/49 (!) 102/41 (!) 158/66  Pulse:  69 73 73  Resp:  _0 Temp:  98.7 F (37.1 C) 98.6 F (37 C) 98.1 F (36.7 C)  TempSrc:  Oral Oral Oral  SpO2: 93% 96% 96% 98%  Weight:      Height:        Intake/Output Summary (Last 24 hours) at 10/12/16 1801 Last data filed at 10/12/16 1214  Gross per 24 hour  Intake              900 ml  Output              400 ml  Net              500 ml   Weight change: -0.168 kg (-5.9 oz) Exam:   General:  Pt is alert, follows commands appropriately, not in acute distress  HEENT: No icterus, No  thrush, No neck mass, [place]/AT  Cardiovascular: RRR, S1/S2, no rubs, no gallops  Respiratory: Bilateral scattered rhonchi, right greater than left. No wheezing.  Abdomen: Soft/+BS, non tender, non distended, no guarding  Extremities: No edema, No lymphangitis, No petechiae, No rashes, no synovitis   Data Reviewed: I have personally reviewed following labs and imaging studies Basic Metabolic  Panel:  Recent Labs Lab 10/06/16 0503 10/09/16 0538 10/10/16 0459 10/10/16 2334 10/11/16 0528  NA 140 140 137 136 137  K 4.1 4.2 3.9 4.0 3.9  CL 104 102 93* 96* 97*  CO2 27 31 33* 32 33*  GLUCOSE 160* 83 96 126* 116*  BUN 11 25* 27* 26* 24*  CREATININE 0.62 0.71 0.95 0.92 0.87  CALCIUM 8.5* 8.7* 8.4* 8.9 8.5*  MG  --   --   --   --  1.8  PHOS  --   --   --   --  3.4   Liver Function Tests:  Recent Labs Lab 10/10/16 2334  AST 16  ALT 12*  ALKPHOS 79  BILITOT 0.7  PROT 6.6  ALBUMIN 2.9*   No results for input(s): LIPASE, AMYLASE in the last 168 hours. No results for input(s): AMMONIA in the last 168 hours. Coagulation Profile: No results for input(s): INR, PROTIME in the last 168 hours. CBC:  Recent Labs Lab 10/06/16 0503 10/07/16 0453 10/09/16 0538 10/10/16 2334 10/11/16 0528  WBC 14.3* 17.6* 12.2* 14.9* 11.5*  NEUTROABS  --   --   --  12.5* 9.1*  HGB 12.4 12.3 12.2 13.5 12.6  HCT 39.7 40.3 40.1 43.1 41.7  MCV 91.3 91.6 90.3 88.9 88.5  PLT 300 328 369 360 341   Cardiac Enzymes:  Recent Labs Lab 10/11/16 0528 10/11/16 1001 10/11/16 1504  TROPONINI 0.03* 0.03* <0.03   BNP: Invalid input(s): POCBNP CBG: No results for input(s): GLUCAP in the last 168 hours. HbA1C: No results for input(s): HGBA1C in the last 72 hours. Urine analysis:    Component Value Date/Time   COLORURINE YELLOW 10/11/2016 0033   APPEARANCEUR CLEAR 10/11/2016 0033   LABSPEC 1.016 10/11/2016 0033   PHURINE 6.0 10/11/2016 0033   GLUCOSEU NEGATIVE 10/11/2016 0033   HGBUR SMALL (A) 10/11/2016 0033   BILIRUBINUR NEGATIVE 10/11/2016 0033   KETONESUR NEGATIVE 10/11/2016 0033   PROTEINUR NEGATIVE 10/11/2016 0033   UROBILINOGEN 1.0 01/19/2011 1030   NITRITE NEGATIVE 10/11/2016 0033   LEUKOCYTESUR NEGATIVE 10/11/2016 0033   Sepsis Labs: _0 (procalcitonin:4,lacticidven:4) ) Recent Results (from the past 240 hour(s))  Culture, bal-quantitative     Status: Abnormal    Collection Time: 10/07/16 12:41 PM  Result Value Ref Range Status   Specimen Description BRONCHIAL WASHINGS RIGHT  Final   Special Requests NONE  Final   Gram Stain   Final    ABUNDANT WBC PRESENT, PREDOMINANTLY PMN RARE GRAM NEGATIVE RODS RARE GRAM POSITIVE COCCI IN PAIRS Performed at [facility], [place] [address] [place], [place] [address]    Culture (A)  Final    60,000 COLONIES/mL VIRIDANS STREPTOCOCCUS 60,000 COLONIES/mL PSEUDOMONAS AERUGINOSA    Report Status 10/10/2016 FINAL  Final   Organism ID, Bacteria VIRIDANS STREPTOCOCCUS (A)  Final   Organism ID, Bacteria PSEUDOMONAS AERUGINOSA (A)  Final      Susceptibility   Pseudomonas aeruginosa - MIC*    CEFTAZIDIME 4 SENSITIVE Sensitive     CIPROFLOXACIN <=0.25 SENSITIVE Sensitive     GENTAMICIN <=1 SENSITIVE Sensitive     IMIPENEM 2 SENSITIVE Sensitive  PIP/TAZO 8 SENSITIVE Sensitive     CEFEPIME 4 SENSITIVE Sensitive     * 60,000 COLONIES/mL PSEUDOMONAS AERUGINOSA   Viridans streptococcus - MIC*    PENICILLIN 2 INTERMEDIATE Intermediate     CEFTRIAXONE 4 RESISTANT Resistant     ERYTHROMYCIN 2 RESISTANT Resistant     LEVOFLOXACIN 1 SENSITIVE Sensitive     VANCOMYCIN 0.5 SENSITIVE Sensitive     * 60,000 COLONIES/mL VIRIDANS STREPTOCOCCUS  Acid Fast Smear (AFB)     Status: None   Collection Time: 10/07/16 12:42 PM  Result Value Ref Range Status   AFB Specimen Processing Concentration  Final   Acid Fast Smear Negative  Final    Comment: (NOTE) Performed At: [facility] [address] [place], [place] [id number] Lindon Romp MD CZ:[id number]    Source (AFB) BRONCHIAL WASHINGS  Final    Comment: RIGHT  Fungus Culture With Stain     Status: None (Preliminary result)   Collection Time: 10/07/16 12:42 PM  Result Value Ref Range Status   Fungus Stain Final report  Final    Comment: (NOTE) Performed At: [facility] [place], [place] [id number] Lindon Romp MD  UK:[id number]    Fungus (Mycology) Culture PENDING  Incomplete   Fungal Source BRONCHIAL WASHINGS  Final    Comment: RIGHT  Fungus Culture Result     Status: None   Collection Time: 10/07/16 12:42 PM  Result Value Ref Range Status   Result 1 Comment  Final    Comment: (NOTE) KOH/Calcofluor preparation:  no fungus observed. Performed At: [facility] [place], [place] [id number] Lindon Romp MD OH:[id number]      Scheduled Meds: . ALPRAZolam  0.25 mg Oral QHS  . apixaban  2.5 mg Oral BID  . escitalopram  10 mg Oral Daily  . guaiFENesin  600 mg Oral BID  . ipratropium-albuterol  3 mL Nebulization QID  . levofloxacin  750 mg Oral Q48H  . mouth rinse  15 mL Mouth Rinse BID  . metoprolol succinate  12.5 mg Oral BID  . mirtazapine  7.5 mg Oral QHS  . predniSONE  15 mg Oral Q breakfast  . senna  1 tablet Oral BID  . sodium chloride flush  3 mL Intravenous Q12H   Continuous Infusions:  Procedures/Studies: Dg Chest 2 View  Result Date: 10/05/2016 CLINICAL DATA:  Increased shortness of breath. Cough, congestion over the last several days. EXAM: CHEST  2 VIEW COMPARISON:  07/16/2016 FINDINGS: The lungs are hyperinflated likely secondary to COPD. There is a small right pleural effusion. There is right lower lobe airspace disease. The left lung is clear. There is no pneumothorax. There is stable cardiomegaly. There is mild bilateral interstitial prominence. There is a dual lead cardiac pacemaker. The osseous structures are unremarkable. IMPRESSION: 1. Right lower lobe pneumonia. Small right pleural effusion. Followup PA and lateral chest X-ray is recommended in 3-4 weeks following trial of antibiotic therapy to ensure resolution and exclude underlying malignancy. 2. Cardiomegaly with mild pulmonary vascular congestion. Electronically Signed   By: Kathreen Devoid   On: 10/05/2016 09:18   Ct Chest Wo Contrast  Addendum Date: 10/05/2016   ADDENDUM REPORT: 10/05/2016  11:10 ADDENDUM: Study discussed by telephone with Dr. Carmin Muskrat on 10/05/2016 at 1101 hours. Electronically Signed   By: Genevie Ann M.D.   On: 10/05/2016 11:10   Result Date: 10/05/2016 CLINICAL DATA:  83 year old female status post fall 1 week ago with right side  chest and rib pain. Abnormal chest radiographs today. EXAM: CT CHEST WITHOUT CONTRAST TECHNIQUE: Multidetector CT imaging of the chest was performed following the standard protocol without IV contrast. COMPARISON:  Chest radiographs 0908 hours today. Lumbar spine CT 05/24/2005 FINDINGS: Cardiovascular: Calcified aorta and coronary artery atherosclerosis. Cardiomegaly. No pericardial effusion. Right chest cardiac pacemaker or AICD. Vascular patency is not evaluated in the absence of IV contrast. Mediastinum/Nodes: 2 cm subcarinal lymph node, favor reactive (See lung findings below). Other mediastinal lymph nodes are within normal limits. Difficult to delineate any hilar lymph nodes in the absence of IV contrast. Mild gaseous distension of the thoracic esophagus appears inconsequential. Lungs/Pleura: Layering right pleural effusion up to 2.5 cm in thickness with simple fluid density. Dependent retained secretions in the right bronchus intermedius (series 5, image 83). The right middle lobe bronchus is occluded on image 98. The right middle lobe bronchus is also occluded. Other Major airways are patent.  No pneumothorax. Abnormal right lower lobe confluent peribronchial opacity extending from the hilum. Extensive more peripheral right lower lobe nodularity and irregular opacity. In the basilar segments of the right lower lobe there is peribronchial consolidation and confluent irregular opacity. Questionable small areas of cavitation within the distal right lower lobe versus bronchiectasis (series 5, image 123). Superimposed confluent right middle lobe atelectasis. Both upper lobes demonstrates smooth septal thickening. There is superimposed peripheral  right upper lobe subpleural reticular opacity. See also right rib findings below. Trace layering left pleural effusion. Mild mostly dependent opacity in the left lower lobe. Upper Abdomen: Multiple nonspecific hypodense lesions in the visible liver, the largest measuring about 3 cm diameter do have simple fluid density. Negative noncontrast visible spleen, pancreas, adrenal glands, kidneys and bowel in the upper abdomen. Musculoskeletal: Osteopenia. Acute fractures of the right lateral third through seventh ribs. These fractures are nondisplaced except at the third rib on series 5, image 50 where there is mild adjacent intercostal and pleural or extrapleural hematoma. Sternum intact. No vertebral fracture identified. No other acute fracture or left rib fracture identified. Visible shoulder osseous structures appear grossly intact. IMPRESSION: 1. Acute fractures of the right lateral third through seventh ribs. The third rib fracture is mildly displaced with associated small volume adjacent intercostal and pleural or extrapleural hematoma. 2. Opacified or occluded right middle lobe and lower lobe bronchi with widespread abnormal right lower lobe opacity. The appearance does not suggest pulmonary contusion. Severe aspiration and/or pneumonia is possible but the appearance is suspicious for an obstructing bronchogenic carcinoma. Follow-up bronchoscopy may be most valuable at this point and is recommended. 3. Small layering right pleural effusion. Trace layering left pleural effusion. 4. Increased subcarinal mediastinal lymph node, indeterminate (as in #2). 5. Cardiomegaly. Calcified aortic and coronary artery atherosclerosis. 6. Indeterminate but favor benign multiple low-density lesions in the liver. Electronically Signed: By: Genevie Ann M.D. On: 10/05/2016 10:55   Dg Chest Port 1 View  Result Date: 10/07/2016 CLINICAL DATA:  Status post bronchoscopy. EXAM: PORTABLE CHEST 1 VIEW COMPARISON:  Radiographs and CT scan of  October 05, 2016. FINDINGS: Stable cardiomegaly with central pulmonary vascular congestion. Atherosclerosis of thoracic aorta is noted. Right-sided pacemaker is unchanged in position. Minimal bilateral pleural effusions are noted. Bibasilar atelectasis or edema is noted. Bony thorax is unremarkable. No pneumothorax is noted. IMPRESSION: Aortic atherosclerosis. Stable cardiomegaly with central pulmonary vascular congestion. Mild bibasilar atelectasis or edema is noted with minimal associated pleural effusions. No pneumothorax is noted. Electronically Signed   By: Marijo Conception, M.D.  On: 10/07/2016 13:26    Juron Vorhees, DO  [facility] Pager [phone]  If 7PM-7AM, please contact night-coverage [email or web address] Password [email or web address] 10/12/2016, 6:01 PM   LOS: 1 day

## 2016-10-12 NOTE — Progress Notes (Signed)
CSW received voice message from staff member [name] from [facility] [phone]), providing CSW with contact information.  CSW returned call, staff in meeting. CSW left message with another staff member requesting return call. CSW will continue to follow up with ALF.   [name], [place] Social Worker [facility] Cell#: [phone]

## 2016-10-12 NOTE — Telephone Encounter (Signed)
Spoke with pts daughter about transmission from 4/16. Informed her that pts device was functioning normally. Pts daughter stated that pt was going to rehab for a couple of weeks and there was no landline available there and wanted to know if the remote for 4/16 could could replace the remote on 5/7 since the pt would be in rehab, informed her that it could and next remote would be scheduler for 01/02/2017. Pts daughter voiced understanding.

## 2016-10-12 NOTE — Care Management Note (Signed)
Case Management Note  Patient Details  Name: [name] MRN: [id number] Date of Birth: [birth year]  Subjective/Objective:     83 year old female with history of atrial fibrillation, diastolic heart failure, recent hospitalization for acute hypoxemic respiratory failure with community-acquired pneumonia, acute on chronic diastolic heart failure. Patient was recently discharged on 10/10/2016. She presented with orthostatic vital signs, dehydratio                Action/Plan: Pt plan to discharge to [facility]   Expected Discharge Date:                  Expected Discharge Plan:  Assisted Living / Rest Home  In-House Referral:  Clinical Social Work  Discharge planning Services  CM Consult  Post Acute Care Choice:    Choice offered to:  Adult Children  DME Arranged:    DME Agency:     HH Arranged:    HH Agency:     Status of Service:  Completed, signed off  If discussed at [place] of Stay Meetings, dates discussed:    Additional CommentsPurcell Mouton, RN 10/12/2016, 11:34 AM

## 2016-10-13 ENCOUNTER — Encounter: Payer: Self-pay | Admitting: Cardiology

## 2016-10-13 LAB — CUP PACEART REMOTE DEVICE CHECK
Brady Statistic RV Percent Paced: 99 %
Implantable Lead Implant Date: 19920213
Implantable Lead Location: 753859
Implantable Lead Serial Number: 23000302
Lead Channel Impedance Value: 350 Ohm
Lead Channel Pacing Threshold Pulse Width: 0.4 ms
Lead Channel Sensing Intrinsic Amplitude: 2.5 mV
MDC IDC LEAD IMPLANT DT: 19920213
MDC IDC LEAD LOCATION: 753860
MDC IDC MSMT LEADCHNL RV IMPEDANCE VALUE: 390 Ohm
MDC IDC MSMT LEADCHNL RV PACING THRESHOLD AMPLITUDE: 0.75 V
MDC IDC PG IMPLANT DT: 20101008
MDC IDC SESS DTM: 20180426111139
MDC IDC STAT BRADY RA PERCENT PACED: 87 %
Pulse Gen Model: 2110
Pulse Gen Serial Number: 2314326

## 2016-10-13 LAB — BASIC METABOLIC PANEL
Anion gap: 7 (ref 5–15)
BUN: 14 mg/dL (ref 6–20)
CHLORIDE: 103 mmol/L (ref 101–111)
CO2: 31 mmol/L (ref 22–32)
Calcium: 8.9 mg/dL (ref 8.9–10.3)
Creatinine, Ser: 0.77 mg/dL (ref 0.44–1.00)
GFR calc Af Amer: >60 mL/min (ref >60)
GFR calc non Af Amer: >60 mL/min (ref >60)
Glucose, Bld: 89 mg/dL (ref 65–99)
POTASSIUM: 4 mmol/L (ref 3.5–5.1)
SODIUM: 141 mmol/L (ref 135–145)

## 2016-10-13 LAB — QUANTIFERON IN TUBE
QFT TB AG MINUS NIL VALUE: <0 IU/mL
QUANTIFERON MITOGEN VALUE: 0.91 [IU]/mL
QUANTIFERON TB AG VALUE: 0.02 IU/mL
QUANTIFERON TB GOLD: NEGATIVE
Quantiferon Nil Value: 0.03 IU/mL

## 2016-10-13 LAB — T4, FREE: Free T4: 1.19 ng/dL — ABNORMAL HIGH (ref 0.61–1.12)

## 2016-10-13 LAB — QUANTIFERON TB GOLD ASSAY (BLOOD)

## 2016-10-13 MED ORDER — LEVOFLOXACIN 750 MG PO TABS: 750.0000 mg | ORAL_TABLET | ORAL | 0 refills | Status: DC

## 2016-10-13 NOTE — Progress Notes (Signed)
LCSW following for discharge planning/disposition:  Pt discharging to [facility]. Facility was selected by family prior to CSW involvement.   Patient will transport by daughter. CM arranged pt's [place] needs (O2 device, HH PT) Family notified regarding discharge - Daughter Jill Side [phone].   All information sent to facility by [facility]. Confirmed with Shawn at [facility] that FL2 and d/c summary were received.    No other needs at this time   Plan: DC to [facility].   Sharren Bridge, MSW, LCSW Clinical Social Work 10/13/2016 [phone]

## 2016-10-13 NOTE — Discharge Summary (Addendum)
Physician Discharge Summary  [name] QPY:[id number] DOB: [birth year] DOA: 10/10/2016  PCP: Mathews Argyle, MD  Admit date: 10/10/2016 Discharge date: 10/13/2016  Admitted From: Home Disposition:   SNF  Recommendations for Outpatient Follow-up:  1. Follow up with PCP in 1-2 weeks 2. Please obtain BMP/CBC in one week 3. Maintain oxygen 2 L nasal cannula, wean to room air for saturation >92% 4. Please weigh pt daily 5. Please dose levofloxacin on 4/27 and 4/29 6. Follow up results of Free T4    Discharge Condition: Stable CODE STATUS: FULL Diet recommendation: Heart Healthy    Brief/Interim Summary: 83 year old female with a history of complete heart block status post PPM, atrial fibrillation, diastolic CHF, polymyalgia rheumatica on chronic prednisone, and COPD presenting with nausea and lightheadedness. Notably, the patient was recently hospitalized on 10/05/2016 through April 20 13,018. During that hospitalization, the patient was treated for respiratory failure secondary to pneumonia and CHF. The patient was diuresed, and her discharge weight was 126 pounds. The patient had a bronchoscopy during that hospitalization secondary to mucous plugging. BAL cultures revealed pseudomonas aeruginosa and shipped coccus viridans. The patient was discharged with levofloxacin. Unfortunately, the patient had worsening dizziness and nausea after returning home. As a result, the patient represented to the emergency department for further evaluation. At the time of readmission, the patient was noted to have positive orthostatic vital signs. Her furosemide was held, and the patient was started on intravenous fluids with clinical improvement.  Repeat orthostatics were negative and pt improved symptomatically.  Discharge Diagnoses:  orthostatic hypotension/presyncope  -Secondary to volume depletion from diuresis  -Improved with fluid resuscitation  -Repeat orthostatic vital signs negative    -Continue to hold furosemide  -saline lock IVF  Chronic diastolic CHF -Appears clinically euvolemic at time of discharge -10/11/2016 echo EF 45-50 percent, apical HK, PASP 33 -daily weights -dry weight 126-129  Acute respiratory failure with hypoxia -Presently stable on 2 L -pt desaturated with ambulation -Secondary to pneumonia and CHF -Pulmonary hygiene -Continue bronchodilators  Lobar pneumonia--Pseudomonas -Continue levofloxacin with last dose on 10/16/16--give one dose on 4/27 and one dose on 4/29 -first dose of levoflox was on 4/23 -10/07/2016 BAL culture with Pseudomonas and Strept viridans -question clinical significance of S. Viridans  Abnormal TSH -TSH 0.032 -Free T4 pending at time of d/c -suspect euthyroid sick syndrome  COPD -Patient continues to smoke -Continue bronchodilators  Paroxysmal atrial fibrillation  -Continue apixiban  -CHADS-VASc = 6  Right rib fracture -pt had recent fall prior to last admission -pain control -pulmonary hygiene -flutter valve  Hypertension -Continue metoprolol succinate  Sick sinus syndrome -Status post permanent pacemaker   Discharge Instructions  Discharge Instructions    Diet - low sodium heart healthy    Complete by:  As directed    Increase activity slowly    Complete by:  As directed      Allergies as of 10/13/2016   No Known Allergies     Medication List    TAKE these medications   alendronate 70 MG tablet Commonly known as:  FOSAMAX Take 70 mg by mouth every Saturday. Take with a full glass of water on an empty stomach.   ALPRAZolam 0.25 MG tablet Commonly known as:  XANAX Take 0.25 mg by mouth at bedtime.   cholecalciferol 1000 units tablet Commonly known as:  VITAMIN D Take 1,000 Units by mouth 2 (two) times daily.   ELIQUIS 2.5 MG Tabs tablet Generic drug:  apixaban TAKE 1 TABLET(2.5 MG) BY MOUTH  TWICE DAILY   escitalopram 10 MG tablet Commonly known as:  LEXAPRO Take 10 mg  by mouth daily.   ferrous sulfate 325 (65 FE) MG tablet Take 325 mg by mouth daily with breakfast.   furosemide 40 MG tablet Commonly known as:  LASIX Take 40 mg by mouth daily as needed (for weight gain of greater than 2lbs).   ipratropium 0.06 % nasal spray Commonly known as:  ATROVENT Place 2 sprays into both nostrils 4 (four) times daily as needed for rhinitis.   Ipratropium-Albuterol 20-100 MCG/ACT Aers respimat Commonly known as:  COMBIVENT RESPIMAT Inhale 1 puff into the lungs every 6 (six) hours as needed for wheezing or shortness of breath.   levofloxacin 750 MG tablet Commonly known as:  LEVAQUIN Take 1 tablet (750 mg total) by mouth every other day. Please give dose on 10/14/16 and final dose on 10/16/16 Start taking on:  10/14/2016 What changed:  additional instructions   metoprolol succinate 25 MG 24 hr tablet Commonly known as:  TOPROL-XL Take 12.5-25 tablets by mouth 2 (two) times daily. Pt takes one tablet in the morning and one-half tablet at bedtime.   midodrine 2.5 MG tablet Commonly known as:  PROAMATINE Take 2.5 mg by mouth as needed (when systolic BP is lower than 100).   mirtazapine 15 MG tablet Commonly known as:  REMERON Take 7.5 mg by mouth at bedtime.   potassium chloride SA 20 MEQ tablet Commonly known as:  K-DUR,KLOR-CON Take 20 mEq by mouth daily as needed (when taking Lasix).   predniSONE 10 MG tablet Commonly known as:  DELTASONE Take 10-15 mg by mouth daily. Takes 1 tablet one day and 1 and 1/2 the next day.Last dose was 15 mg       No Known Allergies  Consultations:  none   Procedures/Studies: Dg Chest 2 View  Result Date: 10/05/2016 CLINICAL DATA:  Increased shortness of breath. Cough, congestion over the last several days. EXAM: CHEST  2 VIEW COMPARISON:  07/16/2016 FINDINGS: The lungs are hyperinflated likely secondary to COPD. There is a small right pleural effusion. There is right lower lobe airspace disease. The left lung is  clear. There is no pneumothorax. There is stable cardiomegaly. There is mild bilateral interstitial prominence. There is a dual lead cardiac pacemaker. The osseous structures are unremarkable. IMPRESSION: 1. Right lower lobe pneumonia. Small right pleural effusion. Followup PA and lateral chest X-ray is recommended in 3-4 weeks following trial of antibiotic therapy to ensure resolution and exclude underlying malignancy. 2. Cardiomegaly with mild pulmonary vascular congestion. Electronically Signed   By: Kathreen Devoid   On: 10/05/2016 09:18   Ct Chest Wo Contrast  Addendum Date: 10/05/2016   ADDENDUM REPORT: 10/05/2016 11:10 ADDENDUM: Study discussed by telephone with Dr. Carmin Muskrat on 10/05/2016 at 1101 hours. Electronically Signed   By: Genevie Ann M.D.   On: 10/05/2016 11:10   Result Date: 10/05/2016 CLINICAL DATA:  83 year old female status post fall 1 week ago with right side chest and rib pain. Abnormal chest radiographs today. EXAM: CT CHEST WITHOUT CONTRAST TECHNIQUE: Multidetector CT imaging of the chest was performed following the standard protocol without IV contrast. COMPARISON:  Chest radiographs 0908 hours today. Lumbar spine CT 05/24/2005 FINDINGS: Cardiovascular: Calcified aorta and coronary artery atherosclerosis. Cardiomegaly. No pericardial effusion. Right chest cardiac pacemaker or AICD. Vascular patency is not evaluated in the absence of IV contrast. Mediastinum/Nodes: 2 cm subcarinal lymph node, favor reactive (See lung findings below). Other mediastinal lymph nodes are  within normal limits. Difficult to delineate any hilar lymph nodes in the absence of IV contrast. Mild gaseous distension of the thoracic esophagus appears inconsequential. Lungs/Pleura: Layering right pleural effusion up to 2.5 cm in thickness with simple fluid density. Dependent retained secretions in the right bronchus intermedius (series 5, image 83). The right middle lobe bronchus is occluded on image 98. The right  middle lobe bronchus is also occluded. Other Major airways are patent.  No pneumothorax. Abnormal right lower lobe confluent peribronchial opacity extending from the hilum. Extensive more peripheral right lower lobe nodularity and irregular opacity. In the basilar segments of the right lower lobe there is peribronchial consolidation and confluent irregular opacity. Questionable small areas of cavitation within the distal right lower lobe versus bronchiectasis (series 5, image 123). Superimposed confluent right middle lobe atelectasis. Both upper lobes demonstrates smooth septal thickening. There is superimposed peripheral right upper lobe subpleural reticular opacity. See also right rib findings below. Trace layering left pleural effusion. Mild mostly dependent opacity in the left lower lobe. Upper Abdomen: Multiple nonspecific hypodense lesions in the visible liver, the largest measuring about 3 cm diameter do have simple fluid density. Negative noncontrast visible spleen, pancreas, adrenal glands, kidneys and bowel in the upper abdomen. Musculoskeletal: Osteopenia. Acute fractures of the right lateral third through seventh ribs. These fractures are nondisplaced except at the third rib on series 5, image 50 where there is mild adjacent intercostal and pleural or extrapleural hematoma. Sternum intact. No vertebral fracture identified. No other acute fracture or left rib fracture identified. Visible shoulder osseous structures appear grossly intact. IMPRESSION: 1. Acute fractures of the right lateral third through seventh ribs. The third rib fracture is mildly displaced with associated small volume adjacent intercostal and pleural or extrapleural hematoma. 2. Opacified or occluded right middle lobe and lower lobe bronchi with widespread abnormal right lower lobe opacity. The appearance does not suggest pulmonary contusion. Severe aspiration and/or pneumonia is possible but the appearance is suspicious for an  obstructing bronchogenic carcinoma. Follow-up bronchoscopy may be most valuable at this point and is recommended. 3. Small layering right pleural effusion. Trace layering left pleural effusion. 4. Increased subcarinal mediastinal lymph node, indeterminate (as in #2). 5. Cardiomegaly. Calcified aortic and coronary artery atherosclerosis. 6. Indeterminate but favor benign multiple low-density lesions in the liver. Electronically Signed: By: Genevie Ann M.D. On: 10/05/2016 10:55   Dg Chest Port 1 View  Result Date: 10/07/2016 CLINICAL DATA:  Status post bronchoscopy. EXAM: PORTABLE CHEST 1 VIEW COMPARISON:  Radiographs and CT scan of October 05, 2016. FINDINGS: Stable cardiomegaly with central pulmonary vascular congestion. Atherosclerosis of thoracic aorta is noted. Right-sided pacemaker is unchanged in position. Minimal bilateral pleural effusions are noted. Bibasilar atelectasis or edema is noted. Bony thorax is unremarkable. No pneumothorax is noted. IMPRESSION: Aortic atherosclerosis. Stable cardiomegaly with central pulmonary vascular congestion. Mild bibasilar atelectasis or edema is noted with minimal associated pleural effusions. No pneumothorax is noted. Electronically Signed   By: Marijo Conception, M.D.   On: 10/07/2016 13:26        Discharge Exam: Vitals:   10/13/16 0428 10/13/16 0945  BP: (!) 158/61 118/76  Pulse: 69 75  Resp: 18 18  Temp: 98.4 F (36.9 C) 98.6 F (37 C)   Vitals:   10/12/16 2300 10/13/16 0428 10/13/16 0945 10/13/16 1009  BP: (!) 143/65 (!) 158/61 118/76   Pulse: 70 69 75   Resp: _0 Temp: 97.5 F (36.4 C) 98.4 F (36.9  C) 98.6 F (37 C)   TempSrc: Oral Oral Oral   SpO2: 98% 98% 96% 95%  Weight:  59.8 kg (131 lb 13.4 oz)    Height:        General: Pt is alert, awake, not in acute distress Cardiovascular: RRR, S1/S2 +, no rubs, no gallops Respiratory: CTA bilaterally, no wheezing, no rhonchi Abdominal: Soft, NT, ND, bowel sounds + Extremities: no  edema, no cyanosis   The results of significant diagnostics from this hospitalization (including imaging, microbiology, ancillary and laboratory) are listed below for reference.    Significant Diagnostic Studies: Dg Chest 2 View  Result Date: 10/05/2016 CLINICAL DATA:  Increased shortness of breath. Cough, congestion over the last several days. EXAM: CHEST  2 VIEW COMPARISON:  07/16/2016 FINDINGS: The lungs are hyperinflated likely secondary to COPD. There is a small right pleural effusion. There is right lower lobe airspace disease. The left lung is clear. There is no pneumothorax. There is stable cardiomegaly. There is mild bilateral interstitial prominence. There is a dual lead cardiac pacemaker. The osseous structures are unremarkable. IMPRESSION: 1. Right lower lobe pneumonia. Small right pleural effusion. Followup PA and lateral chest X-ray is recommended in 3-4 weeks following trial of antibiotic therapy to ensure resolution and exclude underlying malignancy. 2. Cardiomegaly with mild pulmonary vascular congestion. Electronically Signed   By: Kathreen Devoid   On: 10/05/2016 09:18   Ct Chest Wo Contrast  Addendum Date: 10/05/2016   ADDENDUM REPORT: 10/05/2016 11:10 ADDENDUM: Study discussed by telephone with Dr. Carmin Muskrat on 10/05/2016 at 1101 hours. Electronically Signed   By: Genevie Ann M.D.   On: 10/05/2016 11:10   Result Date: 10/05/2016 CLINICAL DATA:  83 year old female status post fall 1 week ago with right side chest and rib pain. Abnormal chest radiographs today. EXAM: CT CHEST WITHOUT CONTRAST TECHNIQUE: Multidetector CT imaging of the chest was performed following the standard protocol without IV contrast. COMPARISON:  Chest radiographs 0908 hours today. Lumbar spine CT 05/24/2005 FINDINGS: Cardiovascular: Calcified aorta and coronary artery atherosclerosis. Cardiomegaly. No pericardial effusion. Right chest cardiac pacemaker or AICD. Vascular patency is not evaluated in the absence of  IV contrast. Mediastinum/Nodes: 2 cm subcarinal lymph node, favor reactive (See lung findings below). Other mediastinal lymph nodes are within normal limits. Difficult to delineate any hilar lymph nodes in the absence of IV contrast. Mild gaseous distension of the thoracic esophagus appears inconsequential. Lungs/Pleura: Layering right pleural effusion up to 2.5 cm in thickness with simple fluid density. Dependent retained secretions in the right bronchus intermedius (series 5, image 83). The right middle lobe bronchus is occluded on image 98. The right middle lobe bronchus is also occluded. Other Major airways are patent.  No pneumothorax. Abnormal right lower lobe confluent peribronchial opacity extending from the hilum. Extensive more peripheral right lower lobe nodularity and irregular opacity. In the basilar segments of the right lower lobe there is peribronchial consolidation and confluent irregular opacity. Questionable small areas of cavitation within the distal right lower lobe versus bronchiectasis (series 5, image 123). Superimposed confluent right middle lobe atelectasis. Both upper lobes demonstrates smooth septal thickening. There is superimposed peripheral right upper lobe subpleural reticular opacity. See also right rib findings below. Trace layering left pleural effusion. Mild mostly dependent opacity in the left lower lobe. Upper Abdomen: Multiple nonspecific hypodense lesions in the visible liver, the largest measuring about 3 cm diameter do have simple fluid density. Negative noncontrast visible spleen, pancreas, adrenal glands, kidneys and bowel in the upper  abdomen. Musculoskeletal: Osteopenia. Acute fractures of the right lateral third through seventh ribs. These fractures are nondisplaced except at the third rib on series 5, image 50 where there is mild adjacent intercostal and pleural or extrapleural hematoma. Sternum intact. No vertebral fracture identified. No other acute fracture or left  rib fracture identified. Visible shoulder osseous structures appear grossly intact. IMPRESSION: 1. Acute fractures of the right lateral third through seventh ribs. The third rib fracture is mildly displaced with associated small volume adjacent intercostal and pleural or extrapleural hematoma. 2. Opacified or occluded right middle lobe and lower lobe bronchi with widespread abnormal right lower lobe opacity. The appearance does not suggest pulmonary contusion. Severe aspiration and/or pneumonia is possible but the appearance is suspicious for an obstructing bronchogenic carcinoma. Follow-up bronchoscopy may be most valuable at this point and is recommended. 3. Small layering right pleural effusion. Trace layering left pleural effusion. 4. Increased subcarinal mediastinal lymph node, indeterminate (as in #2). 5. Cardiomegaly. Calcified aortic and coronary artery atherosclerosis. 6. Indeterminate but favor benign multiple low-density lesions in the liver. Electronically Signed: By: Genevie Ann M.D. On: 10/05/2016 10:55   Dg Chest Port 1 View  Result Date: 10/07/2016 CLINICAL DATA:  Status post bronchoscopy. EXAM: PORTABLE CHEST 1 VIEW COMPARISON:  Radiographs and CT scan of October 05, 2016. FINDINGS: Stable cardiomegaly with central pulmonary vascular congestion. Atherosclerosis of thoracic aorta is noted. Right-sided pacemaker is unchanged in position. Minimal bilateral pleural effusions are noted. Bibasilar atelectasis or edema is noted. Bony thorax is unremarkable. No pneumothorax is noted. IMPRESSION: Aortic atherosclerosis. Stable cardiomegaly with central pulmonary vascular congestion. Mild bibasilar atelectasis or edema is noted with minimal associated pleural effusions. No pneumothorax is noted. Electronically Signed   By: Marijo Conception, M.D.   On: 10/07/2016 13:26     Microbiology: Recent Results (from the past 240 hour(s))  Culture, bal-quantitative     Status: Abnormal   Collection Time: 10/07/16  12:41 PM  Result Value Ref Range Status   Specimen Description BRONCHIAL WASHINGS RIGHT  Final   Special Requests NONE  Final   Gram Stain   Final    ABUNDANT WBC PRESENT, PREDOMINANTLY PMN RARE GRAM NEGATIVE RODS RARE GRAM POSITIVE COCCI IN PAIRS Performed at [facility], [address], [place], [place] [address]    Culture (A)  Final    60,000 COLONIES/mL VIRIDANS STREPTOCOCCUS 60,000 COLONIES/mL PSEUDOMONAS AERUGINOSA    Report Status 10/10/2016 FINAL  Final   Organism ID, Bacteria VIRIDANS STREPTOCOCCUS (A)  Final   Organism ID, Bacteria PSEUDOMONAS AERUGINOSA (A)  Final      Susceptibility   Pseudomonas aeruginosa - MIC*    CEFTAZIDIME 4 SENSITIVE Sensitive     CIPROFLOXACIN <=0.25 SENSITIVE Sensitive     GENTAMICIN <=1 SENSITIVE Sensitive     IMIPENEM 2 SENSITIVE Sensitive     PIP/TAZO 8 SENSITIVE Sensitive     CEFEPIME 4 SENSITIVE Sensitive     * 60,000 COLONIES/mL PSEUDOMONAS AERUGINOSA   Viridans streptococcus - MIC*    PENICILLIN 2 INTERMEDIATE Intermediate     CEFTRIAXONE 4 RESISTANT Resistant     ERYTHROMYCIN 2 RESISTANT Resistant     LEVOFLOXACIN 1 SENSITIVE Sensitive     VANCOMYCIN 0.5 SENSITIVE Sensitive     * 60,000 COLONIES/mL VIRIDANS STREPTOCOCCUS  Acid Fast Smear (AFB)     Status: None   Collection Time: 10/07/16 12:42 PM  Result Value Ref Range Status   AFB Specimen Processing Concentration  Final   Acid  Fast Smear Negative  Final    Comment: (NOTE) Performed At: [facility] [place], [place] [id number] Lindon Romp MD GS:[id number]    Source (AFB) BRONCHIAL WASHINGS  Final    Comment: RIGHT  Fungus Culture With Stain     Status: None (Preliminary result)   Collection Time: 10/07/16 12:42 PM  Result Value Ref Range Status   Fungus Stain Final report  Final    Comment: (NOTE) Performed At: [facility] [place], [place] [id number] Lindon Romp MD KM:[id number]    Fungus (Mycology)  Culture PENDING  Incomplete   Fungal Source BRONCHIAL WASHINGS  Final    Comment: RIGHT  Fungus Culture Result     Status: None   Collection Time: 10/07/16 12:42 PM  Result Value Ref Range Status   Result 1 Comment  Final    Comment: (NOTE) KOH/Calcofluor preparation:  no fungus observed. Performed At: [facility] [place], [place] [id number] Lindon Romp MD VA:[id number]      Labs: Basic Metabolic Panel:  Recent Labs Lab 10/09/16 0538 10/10/16 0459 10/10/16 2334 10/11/16 0528  NA 140 137 136 137  K 4.2 3.9 4.0 3.9  CL 102 93* 96* 97*  CO2 31 33* 32 33*  GLUCOSE 83 96 126* 116*  BUN 25* 27* 26* 24*  CREATININE 0.71 0.95 0.92 0.87  CALCIUM 8.7* 8.4* 8.9 8.5*  MG  --   --   --  1.8  PHOS  --   --   --  3.4   Liver Function Tests:  Recent Labs Lab 10/10/16 2334  AST 16  ALT 12*  ALKPHOS 79  BILITOT 0.7  PROT 6.6  ALBUMIN 2.9*   No results for input(s): LIPASE, AMYLASE in the last 168 hours. No results for input(s): AMMONIA in the last 168 hours. CBC:  Recent Labs Lab 10/07/16 0453 10/09/16 0538 10/10/16 2334 10/11/16 0528  WBC 17.6* 12.2* 14.9* 11.5*  NEUTROABS  --   --  12.5* 9.1*  HGB 12.3 12.2 13.5 12.6  HCT 40.3 40.1 43.1 41.7  MCV 91.6 90.3 88.9 88.5  PLT 328 369 360 341   Cardiac Enzymes:  Recent Labs Lab 10/11/16 0528 10/11/16 1001 10/11/16 1504  TROPONINI 0.03* 0.03* <0.03   BNP: Invalid input(s): POCBNP CBG: No results for input(s): GLUCAP in the last 168 hours.  Time coordinating discharge:  Greater than 30 minutes  Signed:  Sorina Derrig, DO [facility] Pager: [phone] 10/13/2016, 11:10 AM

## 2016-10-13 NOTE — Progress Notes (Signed)
Remote pacemaker transmission.   

## 2016-10-13 NOTE — Clinical Social Work Placement (Signed)
   CLINICAL SOCIAL WORK PLACEMENT  NOTE  Date:  10/13/2016  Patient Details  Name: [name] MRN: [id number] Date of Birth: 08-Jan-1931  Clinical Social Work is seeking post-discharge placement for this patient at the [place] level of care (*CSW will initial, date and re-position this form in  chart as items are completed):  Yes   Patient/family provided with [facility] Work Department's list of facilities offering this level of care within the geographic area requested by the patient (or if unable, by the patient's family).  Yes   Patient/family informed of their freedom to choose among providers that offer the needed level of care, that participate in Medicare, Medicaid or managed care program needed by the patient, have an available bed and are willing to accept the patient.  Yes   Patient/family informed of [facility]'s ownership interest in [facility] and [facility], as well as of the fact that they are under no obligation to receive care at these facilities.  PASRR submitted to EDS on       PASRR number received on       Existing PASRR number confirmed on       FL2 transmitted to all facilities in geographic area requested by pt/family on       FL2 transmitted to all facilities within larger geographic area on       Patient informed that his/her managed care company has contracts with or will negotiate with certain facilities, including the following:        Yes   Patient/family informed of bed offers received.  Patient chooses bed at [facility]     Physician recommends and patient chooses bed at [place]    Patient to be transferred to [facility] on 10/13/16.  Patient to be transferred to facility by family     Patient family notified on 10/13/16 of transfer.  Name of family member notified:  [name], daughter      PHYSICIAN       Additional Comment:     _______________________________________________ Nila Nephew, LCSW 10/13/2016, 4:05 PM

## 2016-10-13 NOTE — Consult Note (Signed)
   [facility] CM Inpatient Consult   10/13/2016  DEETTE REVAK 1931/01/29 [id number]    Patient screened for potential [facility] Care Management services. Chart reviewed. Noted current discharge plan is for  ALF.  There are no identifiable [facility] Care Management needs at this time. If patient's post hospital needs change, please place a [facility] Care Management consult. For questions please contact:  Marthenia Rolling, [place], RN,BSN [facility] Liaison [phone]

## 2016-10-13 NOTE — NC FL2 (Signed)
[place] LEVEL OF CARE SCREENING TOOL     IDENTIFICATION  Patient Name: [name] Birthdate: 1930-10-15 Sex: female Admission Date (Current Location): 10/10/2016  [facility] and [place] Number:  Herbalist and Address:  [facility],  [place] [address], [place]      Provider Number: [id number]  Attending Physician Name and Address:  Orson Eva, MD  Relative Name and Phone Number:       Current Level of Care: Hospital Recommended Level of Care: [place] Prior Approval Number:    Date Approved/Denied:   PASRR Number:    Discharge Plan: Other (Comment) (ALF)    Current Diagnoses: Patient Active Problem List   Diagnosis Date Noted  . Postural dizziness with near syncope   . Essential hypertension 10/11/2016  . Syncope 10/11/2016  . Postural dizziness with presyncope 10/11/2016  . Acute hypoxemic respiratory failure ([place]) 10/05/2016  . Chronic diastolic CHF (congestive heart failure) ([place]) 10/05/2016  . Smoker 10/05/2016  . Closed multiple fractures of right upper extremity with ribs with routine healing 10/05/2016  . HCAP (healthcare-associated pneumonia) 10/31/2015  . COPD (chronic obstructive pulmonary disease) ([place]) 10/31/2015  . Leukocytosis 10/31/2015  . Benign essential HTN 10/31/2015  . Anxiety state   . Acute respiratory failure with hypoxia ([place]) 08/26/2015  . COPD exacerbation ([place]) 08/26/2015  . Diastolic CHF ([place]) 80/99/8338  . Systemic hypertension 08/26/2015  . Hypokalemia 08/26/2015  . Anticoagulant long-term use 08/26/2015  . DM (dermatomyositis) 08/26/2015  . Acute on chronic diastolic CHF (congestive heart failure), NYHA class 1 ([place])   . Sepsis ([place]) 06/07/2015  . CAP (community acquired pneumonia) 06/07/2015  . Acute CHF (congestive heart failure) ([place]) 06/07/2015  . Paroxysmal atrial fibrillation ([place]) 09/04/2014  . Pacemaker 07/13/2013  . CHB (complete heart block) ([place])  07/13/2013  . SSS (sick sinus syndrome) ([place]) 07/13/2013  . Orthostatic hypotension 07/13/2013  . Aortic insufficiency 07/13/2013    Orientation RESPIRATION BLADDER Height & Weight     Self, Time, Situation, Place  O2 (2L) Continent Weight: 131 lb 13.4 oz (59.8 kg) Height:  5\' 5"  (165.1 cm)  BEHAVIORAL SYMPTOMS/MOOD NEUROLOGICAL BOWEL NUTRITION STATUS      Continent Diet (low sodium)  AMBULATORY STATUS COMMUNICATION OF NEEDS Skin   Limited Assist Verbally Skin abrasions (left leg abrasion)                       Personal Care Assistance Level of Assistance  Bathing, Feeding, Dressing Bathing Assistance: Limited assistance Feeding assistance: Independent Dressing Assistance: Limited assistance     Functional Limitations Info  Sight, Hearing, Speech Sight Info: Adequate Hearing Info: Adequate Speech Info: Adequate    SPECIAL CARE FACTORS FREQUENCY  PT (By licensed PT), OT (By licensed OT)     PT Frequency: Min 3x/week OT Frequency: Min 3x/week            Contractures Contractures Info: Not present    Additional Factors Info  Allergies, Code Status Code Status Info: DNR Allergies Info: NKA           Current Medications (10/13/2016):  This is the current hospital active medication list Current Facility-Administered Medications  Medication Dose Route Frequency Provider Last Rate Last Dose  . albuterol (PROVENTIL) (2.5 MG/3ML) 0.083% nebulizer solution 2.5 mg  2.5 mg Nebulization Q4H PRN Toy Baker, MD      . ALPRAZolam Duanne Moron) tablet 0.25 mg  0.25 mg Oral QHS Toy Baker, MD  0.25 mg at 10/12/16 2217  . apixaban (ELIQUIS) tablet 2.5 mg  2.5 mg Oral BID Toy Baker, MD   2.5 mg at 10/13/16 1104  . escitalopram (LEXAPRO) tablet 10 mg  10 mg Oral Daily Toy Baker, MD   10 mg at 10/13/16 1105  . guaiFENesin (MUCINEX) 12 hr tablet 600 mg  600 mg Oral BID Toy Baker, MD   600 mg at 10/13/16 1106  . ipratropium-albuterol  (DUONEB) 0.5-2.5 (3) MG/3ML nebulizer solution 3 mL  3 mL Nebulization QID [name] Mikhail, DO   3 mL at 10/13/16 1147  . levofloxacin (LEVAQUIN) tablet 750 mg  750 mg Oral Q48H Toy Baker, MD   750 mg at 10/12/16 0941  . MEDLINE mouth rinse  15 mL Mouth Rinse BID Toy Baker, MD   15 mL at 10/12/16 2217  . metoprolol succinate (TOPROL-XL) 24 hr tablet 12.5 mg  12.5 mg Oral BID Toy Baker, MD   12.5 mg at 10/13/16 1105  . midodrine (PROAMATINE) tablet 2.5 mg  2.5 mg Oral PRN Toy Baker, MD      . mirtazapine (REMERON) tablet 7.5 mg  7.5 mg Oral QHS Toy Baker, MD   7.5 mg at 10/12/16 2217  . polyethylene glycol (MIRALAX / GLYCOLAX) packet 17 g  17 g Oral Daily PRN Toy Baker, MD      . predniSONE (DELTASONE) tablet 15 mg  15 mg Oral Q breakfast Toy Baker, MD   15 mg at 10/13/16 0852  . senna (SENOKOT) tablet 8.6 mg  1 tablet Oral BID Toy Baker, MD   8.6 mg at 10/13/16 1104  . sodium chloride flush (NS) 0.9 % injection 3 mL  3 mL Intravenous Q12H Toy Baker, MD   3 mL at 10/12/16 2217     Discharge Medications: TAKE these medications   alendronate 70 MG tablet Commonly known as:  FOSAMAX Take 70 mg by mouth every Saturday. Take with a full glass of water on an empty stomach.   ALPRAZolam 0.25 MG tablet Commonly known as:  XANAX Take 0.25 mg by mouth at bedtime.   cholecalciferol 1000 units tablet Commonly known as:  VITAMIN D Take 1,000 Units by mouth 2 (two) times daily.   ELIQUIS 2.5 MG Tabs tablet Generic drug:  apixaban TAKE 1 TABLET(2.5 MG) BY MOUTH TWICE DAILY   escitalopram 10 MG tablet Commonly known as:  LEXAPRO Take 10 mg by mouth daily.   ferrous sulfate 325 (65 FE) MG tablet Take 325 mg by mouth daily with breakfast.   furosemide 40 MG tablet Commonly known as:  LASIX Take 40 mg by mouth daily as needed (for weight gain of greater than 2lbs).   ipratropium 0.06 % nasal spray Commonly  known as:  ATROVENT Place 2 sprays into both nostrils 4 (four) times daily as needed for rhinitis.   Ipratropium-Albuterol 20-100 MCG/ACT Aers respimat Commonly known as:  COMBIVENT RESPIMAT Inhale 1 puff into the lungs every 6 (six) hours as needed for wheezing or shortness of breath.   levofloxacin 750 MG tablet Commonly known as:  LEVAQUIN Take 1 tablet (750 mg total) by mouth every other day. Please give dose on 10/14/16 and final dose on 10/16/16 Start taking on:  10/14/2016 What changed:  additional instructions   metoprolol succinate 25 MG 24 hr tablet Commonly known as:  TOPROL-XL Take 12.5-25 tablets by mouth 2 (two) times daily. Pt takes one tablet in the morning and one-half tablet at bedtime.   midodrine 2.5 MG tablet  Commonly known as:  PROAMATINE Take 2.5 mg by mouth as needed (when systolic BP is lower than 100).   mirtazapine 15 MG tablet Commonly known as:  REMERON Take 7.5 mg by mouth at bedtime.   potassium chloride SA 20 MEQ tablet Commonly known as:  K-DUR,KLOR-CON Take 20 mEq by mouth daily as needed (when taking Lasix).   predniSONE 10 MG tablet Commonly known as:  DELTASONE Take 10-15 mg by mouth daily. Takes 1 tablet one day and 1 and 1/2 the next day.Last dose was 15 mg     Relevant Imaging Results:  Relevant Lab Results:   Additional Information SSN [id number]. Needs Home Health PT at facility  Nila Nephew, [place]

## 2016-10-18 DIAGNOSIS — J449 Chronic obstructive pulmonary disease, unspecified: Secondary | ICD-10-CM | POA: Diagnosis not present

## 2016-10-18 DIAGNOSIS — S81811A Laceration without foreign body, right lower leg, initial encounter: Secondary | ICD-10-CM | POA: Diagnosis not present

## 2016-10-18 DIAGNOSIS — I10 Essential (primary) hypertension: Secondary | ICD-10-CM | POA: Diagnosis not present

## 2016-10-18 DIAGNOSIS — I480 Paroxysmal atrial fibrillation: Secondary | ICD-10-CM | POA: Diagnosis not present

## 2016-10-18 DIAGNOSIS — Z79899 Other long term (current) drug therapy: Secondary | ICD-10-CM | POA: Diagnosis not present

## 2016-10-18 DIAGNOSIS — D692 Other nonthrombocytopenic purpura: Secondary | ICD-10-CM | POA: Diagnosis not present

## 2016-10-18 DIAGNOSIS — J9621 Acute and chronic respiratory failure with hypoxia: Secondary | ICD-10-CM | POA: Diagnosis not present

## 2016-10-19 DIAGNOSIS — R262 Difficulty in walking, not elsewhere classified: Secondary | ICD-10-CM | POA: Diagnosis not present

## 2016-10-19 DIAGNOSIS — S2241XA Multiple fractures of ribs, right side, initial encounter for closed fracture: Secondary | ICD-10-CM | POA: Diagnosis not present

## 2016-10-19 DIAGNOSIS — M6281 Muscle weakness (generalized): Secondary | ICD-10-CM | POA: Diagnosis not present

## 2016-10-19 DIAGNOSIS — J189 Pneumonia, unspecified organism: Secondary | ICD-10-CM | POA: Diagnosis not present

## 2016-10-19 DIAGNOSIS — J441 Chronic obstructive pulmonary disease with (acute) exacerbation: Secondary | ICD-10-CM | POA: Diagnosis not present

## 2016-10-20 DIAGNOSIS — R262 Difficulty in walking, not elsewhere classified: Secondary | ICD-10-CM | POA: Diagnosis not present

## 2016-10-20 DIAGNOSIS — J189 Pneumonia, unspecified organism: Secondary | ICD-10-CM | POA: Diagnosis not present

## 2016-10-20 DIAGNOSIS — S2241XA Multiple fractures of ribs, right side, initial encounter for closed fracture: Secondary | ICD-10-CM | POA: Diagnosis not present

## 2016-10-20 DIAGNOSIS — M6281 Muscle weakness (generalized): Secondary | ICD-10-CM | POA: Diagnosis not present

## 2016-10-20 DIAGNOSIS — J441 Chronic obstructive pulmonary disease with (acute) exacerbation: Secondary | ICD-10-CM | POA: Diagnosis not present

## 2016-10-21 DIAGNOSIS — S2241XA Multiple fractures of ribs, right side, initial encounter for closed fracture: Secondary | ICD-10-CM | POA: Diagnosis not present

## 2016-10-21 DIAGNOSIS — J441 Chronic obstructive pulmonary disease with (acute) exacerbation: Secondary | ICD-10-CM | POA: Diagnosis not present

## 2016-10-21 DIAGNOSIS — J189 Pneumonia, unspecified organism: Secondary | ICD-10-CM | POA: Diagnosis not present

## 2016-10-21 DIAGNOSIS — R262 Difficulty in walking, not elsewhere classified: Secondary | ICD-10-CM | POA: Diagnosis not present

## 2016-10-21 DIAGNOSIS — M6281 Muscle weakness (generalized): Secondary | ICD-10-CM | POA: Diagnosis not present

## 2016-10-24 DIAGNOSIS — J441 Chronic obstructive pulmonary disease with (acute) exacerbation: Secondary | ICD-10-CM | POA: Diagnosis not present

## 2016-10-24 DIAGNOSIS — S2241XA Multiple fractures of ribs, right side, initial encounter for closed fracture: Secondary | ICD-10-CM | POA: Diagnosis not present

## 2016-10-24 DIAGNOSIS — R262 Difficulty in walking, not elsewhere classified: Secondary | ICD-10-CM | POA: Diagnosis not present

## 2016-10-24 DIAGNOSIS — J189 Pneumonia, unspecified organism: Secondary | ICD-10-CM | POA: Diagnosis not present

## 2016-10-24 DIAGNOSIS — M6281 Muscle weakness (generalized): Secondary | ICD-10-CM | POA: Diagnosis not present

## 2016-10-25 DIAGNOSIS — J441 Chronic obstructive pulmonary disease with (acute) exacerbation: Secondary | ICD-10-CM | POA: Diagnosis not present

## 2016-10-25 DIAGNOSIS — J189 Pneumonia, unspecified organism: Secondary | ICD-10-CM | POA: Diagnosis not present

## 2016-10-25 DIAGNOSIS — S2241XA Multiple fractures of ribs, right side, initial encounter for closed fracture: Secondary | ICD-10-CM | POA: Diagnosis not present

## 2016-10-25 DIAGNOSIS — R262 Difficulty in walking, not elsewhere classified: Secondary | ICD-10-CM | POA: Diagnosis not present

## 2016-10-25 DIAGNOSIS — M6281 Muscle weakness (generalized): Secondary | ICD-10-CM | POA: Diagnosis not present

## 2016-10-26 DIAGNOSIS — J189 Pneumonia, unspecified organism: Secondary | ICD-10-CM | POA: Diagnosis not present

## 2016-10-26 DIAGNOSIS — S2241XA Multiple fractures of ribs, right side, initial encounter for closed fracture: Secondary | ICD-10-CM | POA: Diagnosis not present

## 2016-10-26 DIAGNOSIS — M6281 Muscle weakness (generalized): Secondary | ICD-10-CM | POA: Diagnosis not present

## 2016-10-26 DIAGNOSIS — R262 Difficulty in walking, not elsewhere classified: Secondary | ICD-10-CM | POA: Diagnosis not present

## 2016-10-26 DIAGNOSIS — J441 Chronic obstructive pulmonary disease with (acute) exacerbation: Secondary | ICD-10-CM | POA: Diagnosis not present

## 2016-10-27 DIAGNOSIS — R262 Difficulty in walking, not elsewhere classified: Secondary | ICD-10-CM | POA: Diagnosis not present

## 2016-10-27 DIAGNOSIS — J441 Chronic obstructive pulmonary disease with (acute) exacerbation: Secondary | ICD-10-CM | POA: Diagnosis not present

## 2016-10-27 DIAGNOSIS — M6281 Muscle weakness (generalized): Secondary | ICD-10-CM | POA: Diagnosis not present

## 2016-10-27 DIAGNOSIS — S2241XA Multiple fractures of ribs, right side, initial encounter for closed fracture: Secondary | ICD-10-CM | POA: Diagnosis not present

## 2016-10-27 DIAGNOSIS — J189 Pneumonia, unspecified organism: Secondary | ICD-10-CM | POA: Diagnosis not present

## 2016-10-28 DIAGNOSIS — S2241XA Multiple fractures of ribs, right side, initial encounter for closed fracture: Secondary | ICD-10-CM | POA: Diagnosis not present

## 2016-10-28 DIAGNOSIS — J189 Pneumonia, unspecified organism: Secondary | ICD-10-CM | POA: Diagnosis not present

## 2016-10-28 DIAGNOSIS — M6281 Muscle weakness (generalized): Secondary | ICD-10-CM | POA: Diagnosis not present

## 2016-10-28 DIAGNOSIS — J441 Chronic obstructive pulmonary disease with (acute) exacerbation: Secondary | ICD-10-CM | POA: Diagnosis not present

## 2016-10-28 DIAGNOSIS — R262 Difficulty in walking, not elsewhere classified: Secondary | ICD-10-CM | POA: Diagnosis not present

## 2016-10-28 NOTE — Telephone Encounter (Signed)
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 She would not require treatment for it right now but would need monitoring. Please call and ensure she has follow up in clinic.  [name]

## 2016-10-28 NOTE — Telephone Encounter (Signed)
[name] states pt has an appt with her pcp first and she will discuss further with them and then call us to get the follow up appt.

## 2016-10-31 DIAGNOSIS — S2241XA Multiple fractures of ribs, right side, initial encounter for closed fracture: Secondary | ICD-10-CM | POA: Diagnosis not present

## 2016-10-31 DIAGNOSIS — R262 Difficulty in walking, not elsewhere classified: Secondary | ICD-10-CM | POA: Diagnosis not present

## 2016-10-31 DIAGNOSIS — J441 Chronic obstructive pulmonary disease with (acute) exacerbation: Secondary | ICD-10-CM | POA: Diagnosis not present

## 2016-10-31 DIAGNOSIS — M6281 Muscle weakness (generalized): Secondary | ICD-10-CM | POA: Diagnosis not present

## 2016-10-31 DIAGNOSIS — J189 Pneumonia, unspecified organism: Secondary | ICD-10-CM | POA: Diagnosis not present

## 2016-11-02 DIAGNOSIS — S2241XA Multiple fractures of ribs, right side, initial encounter for closed fracture: Secondary | ICD-10-CM | POA: Diagnosis not present

## 2016-11-02 DIAGNOSIS — R262 Difficulty in walking, not elsewhere classified: Secondary | ICD-10-CM | POA: Diagnosis not present

## 2016-11-02 DIAGNOSIS — M6281 Muscle weakness (generalized): Secondary | ICD-10-CM | POA: Diagnosis not present

## 2016-11-02 DIAGNOSIS — J441 Chronic obstructive pulmonary disease with (acute) exacerbation: Secondary | ICD-10-CM | POA: Diagnosis not present

## 2016-11-02 DIAGNOSIS — J189 Pneumonia, unspecified organism: Secondary | ICD-10-CM | POA: Diagnosis not present

## 2016-11-03 DIAGNOSIS — J189 Pneumonia, unspecified organism: Secondary | ICD-10-CM | POA: Diagnosis not present

## 2016-11-03 DIAGNOSIS — S2241XA Multiple fractures of ribs, right side, initial encounter for closed fracture: Secondary | ICD-10-CM | POA: Diagnosis not present

## 2016-11-03 DIAGNOSIS — J441 Chronic obstructive pulmonary disease with (acute) exacerbation: Secondary | ICD-10-CM | POA: Diagnosis not present

## 2016-11-03 DIAGNOSIS — M6281 Muscle weakness (generalized): Secondary | ICD-10-CM | POA: Diagnosis not present

## 2016-11-03 DIAGNOSIS — R262 Difficulty in walking, not elsewhere classified: Secondary | ICD-10-CM | POA: Diagnosis not present

## 2016-11-04 DIAGNOSIS — R262 Difficulty in walking, not elsewhere classified: Secondary | ICD-10-CM | POA: Diagnosis not present

## 2016-11-04 DIAGNOSIS — M6281 Muscle weakness (generalized): Secondary | ICD-10-CM | POA: Diagnosis not present

## 2016-11-04 DIAGNOSIS — J189 Pneumonia, unspecified organism: Secondary | ICD-10-CM | POA: Diagnosis not present

## 2016-11-04 DIAGNOSIS — J441 Chronic obstructive pulmonary disease with (acute) exacerbation: Secondary | ICD-10-CM | POA: Diagnosis not present

## 2016-11-04 DIAGNOSIS — S2241XA Multiple fractures of ribs, right side, initial encounter for closed fracture: Secondary | ICD-10-CM | POA: Diagnosis not present

## 2016-11-07 DIAGNOSIS — J189 Pneumonia, unspecified organism: Secondary | ICD-10-CM | POA: Diagnosis not present

## 2016-11-07 DIAGNOSIS — S2241XA Multiple fractures of ribs, right side, initial encounter for closed fracture: Secondary | ICD-10-CM | POA: Diagnosis not present

## 2016-11-07 DIAGNOSIS — R262 Difficulty in walking, not elsewhere classified: Secondary | ICD-10-CM | POA: Diagnosis not present

## 2016-11-07 DIAGNOSIS — J441 Chronic obstructive pulmonary disease with (acute) exacerbation: Secondary | ICD-10-CM | POA: Diagnosis not present

## 2016-11-07 DIAGNOSIS — M6281 Muscle weakness (generalized): Secondary | ICD-10-CM | POA: Diagnosis not present

## 2016-11-08 DIAGNOSIS — J449 Chronic obstructive pulmonary disease, unspecified: Secondary | ICD-10-CM | POA: Diagnosis not present

## 2016-11-08 DIAGNOSIS — M353 Polymyalgia rheumatica: Secondary | ICD-10-CM | POA: Diagnosis not present

## 2016-11-08 DIAGNOSIS — D692 Other nonthrombocytopenic purpura: Secondary | ICD-10-CM | POA: Diagnosis not present

## 2016-11-08 DIAGNOSIS — I10 Essential (primary) hypertension: Secondary | ICD-10-CM | POA: Diagnosis not present

## 2016-11-08 LAB — FUNGUS CULTURE WITH STAIN

## 2016-11-08 LAB — FUNGUS CULTURE RESULT

## 2016-11-08 LAB — FUNGAL ORGANISM REFLEX

## 2016-11-09 ENCOUNTER — Ambulatory Visit (INDEPENDENT_AMBULATORY_CARE_PROVIDER_SITE_OTHER): Payer: Medicare Other | Admitting: Pulmonary Disease

## 2016-11-09 ENCOUNTER — Encounter: Payer: Self-pay | Admitting: Pulmonary Disease

## 2016-11-09 VITALS — BP 106/64 | HR 76 | Ht 65.0 in | Wt 132.8 lb

## 2016-11-09 DIAGNOSIS — M6281 Muscle weakness (generalized): Secondary | ICD-10-CM | POA: Diagnosis not present

## 2016-11-09 DIAGNOSIS — R262 Difficulty in walking, not elsewhere classified: Secondary | ICD-10-CM | POA: Diagnosis not present

## 2016-11-09 DIAGNOSIS — J181 Lobar pneumonia, unspecified organism: Secondary | ICD-10-CM

## 2016-11-09 DIAGNOSIS — J189 Pneumonia, unspecified organism: Secondary | ICD-10-CM | POA: Diagnosis not present

## 2016-11-09 DIAGNOSIS — J438 Other emphysema: Secondary | ICD-10-CM

## 2016-11-09 DIAGNOSIS — S2241XA Multiple fractures of ribs, right side, initial encounter for closed fracture: Secondary | ICD-10-CM | POA: Diagnosis not present

## 2016-11-09 DIAGNOSIS — J441 Chronic obstructive pulmonary disease with (acute) exacerbation: Secondary | ICD-10-CM | POA: Diagnosis not present

## 2016-11-09 MED ORDER — BUDESONIDE-FORMOTEROL FUMARATE 160-4.5 MCG/ACT IN AERO: 2.0000 | INHALATION_SPRAY | Freq: Two times a day (BID) | RESPIRATORY_TRACT | 6 refills | Status: DC

## 2016-11-09 MED ORDER — BUDESONIDE-FORMOTEROL FUMARATE 160-4.5 MCG/ACT IN AERO: 2.0000 | INHALATION_SPRAY | Freq: Two times a day (BID) | RESPIRATORY_TRACT | 0 refills | Status: DC

## 2016-11-09 NOTE — Progress Notes (Signed)
[name]    [id number]    10-Oct-1930  Primary Care Physician:Stone[place], Christiane Ha, MD  Referring Physician: Lajean Manes, MD [address] [place], [place] [address]  Chief complaint:  Follow up for COPD and recent hospitalization for PNA.  HPI: 83 year old with history of complete heart block status post pacemaker, diastolic heart failure, paroxysmal A. fib, and prolymyalgia rheumatica, COPD. She was admitted to [facility] in April, 2018 while on a trip outside [place]. She did not seek medical attention for about a week and came with worsening dyspnea, cough, congestion, mucus production, wheezing. CT scan in the ED showed fractured ribs on the right with extensive consolidation, narrowing of the bronchus on the right with question on endobronchial lesion. PCCM called for further evaluation. She underwent a bronchoscopy on 10/07/16 which did not show any endobronchial lesion. There is purulent secretions in the right lung which was sent for culture. The microbiology shows Pseudomonas and viridans strep which was treated adequately with antibiotics. The AFB culture is showing MAI. She was sent to rehabilitation and is now in assisted living and is making slow recovery.  She has history of COPD and is maintained on inhalers by primary care. She has not had PFTs or a pulmonary eval,  not on home O2. She still continues to smoke   Outpatient Encounter Prescriptions as of 11/09/2016  Medication Sig  . alendronate (FOSAMAX) 70 MG tablet Take 70 mg by mouth every Saturday. Take with a full glass of water on an empty stomach.  . ALPRAZolam (XANAX) 0.25 MG tablet Take 0.25 mg by mouth at bedtime.   [name] ELIQUIS 2.5 MG TABS tablet TAKE 1 TABLET(2.5 MG) BY MOUTH TWICE DAILY  . escitalopram (LEXAPRO) 10 MG tablet Take 10 mg by mouth daily.  . furosemide (LASIX) 40 MG tablet Take 40 mg by mouth daily as needed (for weight gain of greater than 2lbs).  [name] ipratropium (ATROVENT) 0.06 %  nasal spray Place 2 sprays into both nostrils 4 (four) times daily as needed for rhinitis.  . Ipratropium-Albuterol (COMBIVENT RESPIMAT) 20-100 MCG/ACT AERS respimat Inhale 1 puff into the lungs every 6 (six) hours as needed for wheezing or shortness of breath.  . metoprolol succinate (TOPROL-XL) 25 MG 24 hr tablet Take 12.5-25 tablets by mouth 2 (two) times daily. Pt takes one tablet in the morning and one-half tablet at bedtime.  . midodrine (PROAMATINE) 2.5 MG tablet Take 2.5 mg by mouth as needed (when systolic BP is lower than 100).   . mirtazapine (REMERON) 15 MG tablet Take 7.5 mg by mouth at bedtime.   . potassium chloride SA (K-DUR,KLOR-CON) 20 MEQ tablet Take 20 mEq by mouth daily as needed (when taking Lasix).   . predniSONE (DELTASONE) 10 MG tablet Take 10 mg by mouth daily.   . [DISCONTINUED] cholecalciferol (VITAMIN D) 1000 units tablet Take 1,000 Units by mouth 2 (two) times daily.  . [DISCONTINUED] ferrous sulfate 325 (65 FE) MG tablet Take 325 mg by mouth daily with breakfast.  . [DISCONTINUED] levofloxacin (LEVAQUIN) 750 MG tablet Take 1 tablet (750 mg total) by mouth every other day. Please give dose on 10/14/16 and final dose on 10/16/16   No facility-administered encounter medications on file as of 11/09/2016.     Allergies as of 11/09/2016  . (No Known Allergies)    Past Medical History:  Diagnosis Date  . CHB (complete heart block) ([place]) 07/13/2013   Pacemaker dependent  . CHF (  congestive heart failure) ([place])   . COPD (chronic obstructive pulmonary disease) ([place])   . DM (dermatomyositis)   . Orthostatic hypotension   . Pacemaker 07/13/2013   Her original pacemaker and the current leads were implanted in 1992. She has had 2 generator change out, most recently in 2010. Her device is a Buyer, retail 2110 non RF dual-chamber pacemaker with a battery longevity estimated at about 7 years. The atrial lead is a St. Jude [id number] and the ventricular lead was a Biotronik [id number].   [name]  Paroxysmal atrial fibrillation ([place]) 09/04/2014  . Scoliosis   . SSS (sick sinus syndrome) ([place]) 07/13/2013  . Syncope   . Systemic hypertension   . Thoracic kyphosis     Past Surgical History:  Procedure Laterality Date  . NM MYOCAR PERF WALL MOTION  09/13/2011   Low risk  . PERMANENT PACEMAKER GENERATOR CHANGE  03/27/2009   St.Jude  . US ECHOCARDIOGRAPHY  03/28/2012   Mod LAE,mild MR,aortic sclerosis w/mod AI,mod. TR,mild PI,Stage I diastolic dysfunction  . VIDEO BRONCHOSCOPY Bilateral 10/07/2016   Procedure: VIDEO BRONCHOSCOPY WITH FLUORO;  Surgeon: Marshell Garfinkel, MD;  Location: [facility];  Service: Cardiopulmonary;  Laterality: Bilateral;    Family History  Problem Relation Age of Onset  . Stroke Mother     Social History   Social History  . Marital status: Married    Spouse name: N/A  . Number of children: N/A  . Years of education: N/A   Occupational History  . Not on file.   Social History Main Topics  . Smoking status: Former Smoker    Packs/day: 0.50    Years: 60.00    Types: Cigarettes  . Smokeless tobacco: Never Used     Comment: quit 10/10/16--11/09/16  . Alcohol use No  . Drug use: No  . Sexual activity: Not on file   Other Topics Concern  . Not on file   Social History Narrative  . No narrative on file    Review of systems: Review of Systems  Constitutional: Negative for fever and chills.  HENT: Negative.   Eyes: Negative for blurred vision.  Respiratory: as per HPI  Cardiovascular: Negative for chest pain and palpitations.  Gastrointestinal: Negative for vomiting, diarrhea, blood per rectum. Genitourinary: Negative for dysuria, urgency, frequency and hematuria.  Musculoskeletal: Negative for myalgias, back pain and joint pain.  Skin: Negative for itching and rash.  Neurological: Negative for dizziness, tremors, focal weakness, seizures and loss of consciousness.  Endo/Heme/Allergies: Negative for environmental allergies.    Psychiatric/Behavioral: Negative for depression, suicidal ideas and hallucinations.  All other systems reviewed and are negative.  Physical Exam: Blood pressure 106/64, pulse 76, height 5\' 5"  (1.651 m), weight 132 lb 12.8 oz (60.2 kg), SpO2 94 %. Gen:      No acute distress HEENT:  EOMI, sclera anicteric Neck:     No masses; no thyromegaly Lungs:    Clear to auscultation bilaterally; normal respiratory effort CV:         Regular rate and rhythm; no murmurs Abd:      + bowel sounds; soft, non-tender; no palpable masses, no distension Ext:    No edema; adequate peripheral perfusion Skin:      Warm and dry; no rash Neuro: alert and oriented x 3 Psych: normal mood and affect  Data Reviewed: Bronchoscopy culture 10/07/16-  Pseudomonas aeruginosa Strep viridans MAI  Echocardiogram 10/12/14 Apical hypokinesis with mildly reduced LV systolic function; mild  LVH; sclerotic aortic valve with  mild AI; mild MR; mild biatrial  enlargement; mild RVE; mild TR; mildly elevated pulmonary  pressure.  CT chest 10/05/16 - trace right effusion, multiple rib fractures on R, extensive consolidative changes and RML / LL and LL with narrowing of right mainstem bronchus. Subcarinal lymph node. I had reviewed the images personally  Assessment:  Right lung pneumonia after fall ? Endobronchial lesion I suspect suspect she developed a PNA after a fall due to splinting in the setting of multiple rib fractures with inability to clear secretions. There was a questionable endobronchial lesion but it was probably just mucus. We will repeat a CT scan in a couple of months to ensure clearance of the infiltrate. She has MAI growing in the sputum but we can hold off on treating this now as clinically she is improved. Reval after CT scan.  COPD She likely has COPD given her extensive smoking history. I'll start on Symbicort and order PFTs. I am not sure why she is on 10 mg of prednisone. If she is stable at next visit  we'll start tapering this off.  Active smoker I have strongly encouraged her to quit smoking altogether. She wants to do this on her own. Time spent counseling-5 minutes  Plan/Recommendations: - Follow up CT scan  - PFTs - Start symbicort - Smoking cessation.  Marshell Garfinkel MD [facility] Pager [phone] 11/09/2016, 10:59 AM  CC: Lajean Manes, MD

## 2016-11-09 NOTE — Patient Instructions (Addendum)
We will start you on Symbicort 160/4.5 This schedule a CT without contrast in 2-3 months. Follow-up after CT scan. We'll get PFTs on the day of return visit

## 2016-11-10 DIAGNOSIS — J441 Chronic obstructive pulmonary disease with (acute) exacerbation: Secondary | ICD-10-CM | POA: Diagnosis not present

## 2016-11-10 DIAGNOSIS — M6281 Muscle weakness (generalized): Secondary | ICD-10-CM | POA: Diagnosis not present

## 2016-11-10 DIAGNOSIS — J189 Pneumonia, unspecified organism: Secondary | ICD-10-CM | POA: Diagnosis not present

## 2016-11-10 DIAGNOSIS — S2241XA Multiple fractures of ribs, right side, initial encounter for closed fracture: Secondary | ICD-10-CM | POA: Diagnosis not present

## 2016-11-10 DIAGNOSIS — R262 Difficulty in walking, not elsewhere classified: Secondary | ICD-10-CM | POA: Diagnosis not present

## 2016-11-11 DIAGNOSIS — R262 Difficulty in walking, not elsewhere classified: Secondary | ICD-10-CM | POA: Diagnosis not present

## 2016-11-11 DIAGNOSIS — S2241XA Multiple fractures of ribs, right side, initial encounter for closed fracture: Secondary | ICD-10-CM | POA: Diagnosis not present

## 2016-11-11 DIAGNOSIS — J189 Pneumonia, unspecified organism: Secondary | ICD-10-CM | POA: Diagnosis not present

## 2016-11-11 DIAGNOSIS — M6281 Muscle weakness (generalized): Secondary | ICD-10-CM | POA: Diagnosis not present

## 2016-11-11 DIAGNOSIS — J441 Chronic obstructive pulmonary disease with (acute) exacerbation: Secondary | ICD-10-CM | POA: Diagnosis not present

## 2016-11-15 DIAGNOSIS — M6281 Muscle weakness (generalized): Secondary | ICD-10-CM | POA: Diagnosis not present

## 2016-11-15 DIAGNOSIS — S2241XA Multiple fractures of ribs, right side, initial encounter for closed fracture: Secondary | ICD-10-CM | POA: Diagnosis not present

## 2016-11-15 DIAGNOSIS — J441 Chronic obstructive pulmonary disease with (acute) exacerbation: Secondary | ICD-10-CM | POA: Diagnosis not present

## 2016-11-15 DIAGNOSIS — J189 Pneumonia, unspecified organism: Secondary | ICD-10-CM | POA: Diagnosis not present

## 2016-11-15 DIAGNOSIS — R262 Difficulty in walking, not elsewhere classified: Secondary | ICD-10-CM | POA: Diagnosis not present

## 2016-11-17 DIAGNOSIS — S2241XA Multiple fractures of ribs, right side, initial encounter for closed fracture: Secondary | ICD-10-CM | POA: Diagnosis not present

## 2016-11-17 DIAGNOSIS — M6281 Muscle weakness (generalized): Secondary | ICD-10-CM | POA: Diagnosis not present

## 2016-11-17 DIAGNOSIS — J189 Pneumonia, unspecified organism: Secondary | ICD-10-CM | POA: Diagnosis not present

## 2016-11-17 DIAGNOSIS — R262 Difficulty in walking, not elsewhere classified: Secondary | ICD-10-CM | POA: Diagnosis not present

## 2016-11-17 DIAGNOSIS — J441 Chronic obstructive pulmonary disease with (acute) exacerbation: Secondary | ICD-10-CM | POA: Diagnosis not present

## 2016-11-18 DIAGNOSIS — J189 Pneumonia, unspecified organism: Secondary | ICD-10-CM | POA: Diagnosis not present

## 2016-11-18 DIAGNOSIS — J441 Chronic obstructive pulmonary disease with (acute) exacerbation: Secondary | ICD-10-CM | POA: Diagnosis not present

## 2016-11-18 DIAGNOSIS — R262 Difficulty in walking, not elsewhere classified: Secondary | ICD-10-CM | POA: Diagnosis not present

## 2016-11-18 DIAGNOSIS — S2241XA Multiple fractures of ribs, right side, initial encounter for closed fracture: Secondary | ICD-10-CM | POA: Diagnosis not present

## 2016-11-18 DIAGNOSIS — M6281 Muscle weakness (generalized): Secondary | ICD-10-CM | POA: Diagnosis not present

## 2016-11-21 DIAGNOSIS — J441 Chronic obstructive pulmonary disease with (acute) exacerbation: Secondary | ICD-10-CM | POA: Diagnosis not present

## 2016-11-21 DIAGNOSIS — M6281 Muscle weakness (generalized): Secondary | ICD-10-CM | POA: Diagnosis not present

## 2016-11-21 DIAGNOSIS — R262 Difficulty in walking, not elsewhere classified: Secondary | ICD-10-CM | POA: Diagnosis not present

## 2016-11-21 DIAGNOSIS — S2241XA Multiple fractures of ribs, right side, initial encounter for closed fracture: Secondary | ICD-10-CM | POA: Diagnosis not present

## 2016-11-21 DIAGNOSIS — J189 Pneumonia, unspecified organism: Secondary | ICD-10-CM | POA: Diagnosis not present

## 2016-11-30 LAB — ACID FAST CULTURE WITH REFLEXED SENSITIVITIES: ACID FAST CULTURE - AFSCU3: POSITIVE — AB

## 2016-11-30 LAB — AFB ORGANISM ID BY DNA PROBE
M TUBERCULOSIS COMPLEX: NEGATIVE
M avium complex: POSITIVE — AB

## 2016-12-23 ENCOUNTER — Other Ambulatory Visit: Payer: Self-pay | Admitting: Cardiovascular Disease

## 2016-12-28 ENCOUNTER — Ambulatory Visit
Admission: RE | Admit: 2016-12-28 | Discharge: 2016-12-28 | Disposition: A | Discharge status: Home / Self Care | Payer: Medicare Other | Source: Ambulatory Visit | Attending: Geriatric Medicine | Admitting: Geriatric Medicine

## 2016-12-28 ENCOUNTER — Other Ambulatory Visit: Payer: Self-pay | Admitting: Geriatric Medicine

## 2016-12-28 DIAGNOSIS — M79605 Pain in left leg: Secondary | ICD-10-CM

## 2016-12-28 DIAGNOSIS — I10 Essential (primary) hypertension: Secondary | ICD-10-CM | POA: Diagnosis not present

## 2016-12-28 DIAGNOSIS — M25462 Effusion, left knee: Secondary | ICD-10-CM | POA: Diagnosis not present

## 2016-12-28 DIAGNOSIS — M1612 Unilateral primary osteoarthritis, left hip: Secondary | ICD-10-CM | POA: Diagnosis not present

## 2016-12-28 DIAGNOSIS — I480 Paroxysmal atrial fibrillation: Secondary | ICD-10-CM | POA: Diagnosis not present

## 2016-12-28 DIAGNOSIS — I700 Atherosclerosis of aorta: Secondary | ICD-10-CM | POA: Diagnosis not present

## 2016-12-28 DIAGNOSIS — M353 Polymyalgia rheumatica: Secondary | ICD-10-CM | POA: Diagnosis not present

## 2017-01-01 ENCOUNTER — Encounter (HOSPITAL_COMMUNITY): Payer: Self-pay

## 2017-01-01 ENCOUNTER — Emergency Department (HOSPITAL_COMMUNITY): Payer: Medicare Other

## 2017-01-01 ENCOUNTER — Inpatient Hospital Stay (HOSPITAL_COMMUNITY)
Admission: EM | Admit: 2017-01-01 | Discharge: 2017-01-05 | DRG: 981 | Disposition: A | Discharge status: Home Health | Payer: Medicare Other | Attending: Internal Medicine | Admitting: Internal Medicine

## 2017-01-01 DIAGNOSIS — Z7983 Long term (current) use of bisphosphonates: Secondary | ICD-10-CM

## 2017-01-01 DIAGNOSIS — M25572 Pain in left ankle and joints of left foot: Secondary | ICD-10-CM | POA: Diagnosis present

## 2017-01-01 DIAGNOSIS — M353 Polymyalgia rheumatica: Secondary | ICD-10-CM | POA: Diagnosis present

## 2017-01-01 DIAGNOSIS — I5030 Unspecified diastolic (congestive) heart failure: Secondary | ICD-10-CM | POA: Diagnosis present

## 2017-01-01 DIAGNOSIS — M549 Dorsalgia, unspecified: Secondary | ICD-10-CM | POA: Diagnosis not present

## 2017-01-01 DIAGNOSIS — M4854XA Collapsed vertebra, not elsewhere classified, thoracic region, initial encounter for fracture: Secondary | ICD-10-CM | POA: Diagnosis present

## 2017-01-01 DIAGNOSIS — I110 Hypertensive heart disease with heart failure: Principal | ICD-10-CM | POA: Diagnosis present

## 2017-01-01 DIAGNOSIS — I10 Essential (primary) hypertension: Secondary | ICD-10-CM | POA: Diagnosis present

## 2017-01-01 DIAGNOSIS — J449 Chronic obstructive pulmonary disease, unspecified: Secondary | ICD-10-CM | POA: Diagnosis present

## 2017-01-01 DIAGNOSIS — R0902 Hypoxemia: Secondary | ICD-10-CM | POA: Diagnosis not present

## 2017-01-01 DIAGNOSIS — Z950 Presence of cardiac pacemaker: Secondary | ICD-10-CM

## 2017-01-01 DIAGNOSIS — S99922A Unspecified injury of left foot, initial encounter: Secondary | ICD-10-CM | POA: Diagnosis present

## 2017-01-01 DIAGNOSIS — I5043 Acute on chronic combined systolic (congestive) and diastolic (congestive) heart failure: Secondary | ICD-10-CM | POA: Diagnosis present

## 2017-01-01 DIAGNOSIS — M791 Myalgia, unspecified site: Secondary | ICD-10-CM

## 2017-01-01 DIAGNOSIS — Z7951 Long term (current) use of inhaled steroids: Secondary | ICD-10-CM

## 2017-01-01 DIAGNOSIS — J9601 Acute respiratory failure with hypoxia: Secondary | ICD-10-CM | POA: Diagnosis not present

## 2017-01-01 DIAGNOSIS — M419 Scoliosis, unspecified: Secondary | ICD-10-CM | POA: Diagnosis present

## 2017-01-01 DIAGNOSIS — R7989 Other specified abnormal findings of blood chemistry: Secondary | ICD-10-CM

## 2017-01-01 DIAGNOSIS — M5126 Other intervertebral disc displacement, lumbar region: Secondary | ICD-10-CM | POA: Diagnosis not present

## 2017-01-01 DIAGNOSIS — W228XXA Striking against or struck by other objects, initial encounter: Secondary | ICD-10-CM | POA: Diagnosis present

## 2017-01-01 DIAGNOSIS — I442 Atrioventricular block, complete: Secondary | ICD-10-CM | POA: Diagnosis present

## 2017-01-01 DIAGNOSIS — M8580 Other specified disorders of bone density and structure, unspecified site: Secondary | ICD-10-CM | POA: Diagnosis present

## 2017-01-01 DIAGNOSIS — R791 Abnormal coagulation profile: Secondary | ICD-10-CM | POA: Diagnosis not present

## 2017-01-01 DIAGNOSIS — Z7952 Long term (current) use of systemic steroids: Secondary | ICD-10-CM

## 2017-01-01 DIAGNOSIS — Z7901 Long term (current) use of anticoagulants: Secondary | ICD-10-CM | POA: Diagnosis not present

## 2017-01-01 DIAGNOSIS — S22079A Unspecified fracture of T9-T10 vertebra, initial encounter for closed fracture: Secondary | ICD-10-CM

## 2017-01-01 DIAGNOSIS — Z79899 Other long term (current) drug therapy: Secondary | ICD-10-CM

## 2017-01-01 DIAGNOSIS — F329 Major depressive disorder, single episode, unspecified: Secondary | ICD-10-CM | POA: Diagnosis present

## 2017-01-01 DIAGNOSIS — J189 Pneumonia, unspecified organism: Secondary | ICD-10-CM | POA: Diagnosis not present

## 2017-01-01 DIAGNOSIS — M79672 Pain in left foot: Secondary | ICD-10-CM | POA: Diagnosis not present

## 2017-01-01 DIAGNOSIS — Z87891 Personal history of nicotine dependence: Secondary | ICD-10-CM

## 2017-01-01 DIAGNOSIS — F419 Anxiety disorder, unspecified: Secondary | ICD-10-CM | POA: Diagnosis present

## 2017-01-01 DIAGNOSIS — I480 Paroxysmal atrial fibrillation: Secondary | ICD-10-CM | POA: Diagnosis present

## 2017-01-01 DIAGNOSIS — I495 Sick sinus syndrome: Secondary | ICD-10-CM | POA: Diagnosis present

## 2017-01-01 DIAGNOSIS — R911 Solitary pulmonary nodule: Secondary | ICD-10-CM | POA: Diagnosis not present

## 2017-01-01 HISTORY — DX: Polymyalgia rheumatica: M35.3

## 2017-01-01 LAB — COMPREHENSIVE METABOLIC PANEL
ALBUMIN: 3.6 g/dL (ref 3.5–5.0)
ALK PHOS: 84 U/L (ref 38–126)
ALT: 15 U/L (ref 14–54)
ANION GAP: 9 (ref 5–15)
AST: 22 U/L (ref 15–41)
BILIRUBIN TOTAL: 1.3 mg/dL — AB (ref 0.3–1.2)
BUN: 18 mg/dL (ref 6–20)
CALCIUM: 8.7 mg/dL — AB (ref 8.9–10.3)
CO2: 29 mmol/L (ref 22–32)
Chloride: 105 mmol/L (ref 101–111)
Creatinine, Ser: 0.78 mg/dL (ref 0.44–1.00)
GFR calc Af Amer: >60 mL/min (ref >60)
GFR calc non Af Amer: >60 mL/min (ref >60)
GLUCOSE: 89 mg/dL (ref 65–99)
Potassium: 3.6 mmol/L (ref 3.5–5.1)
SODIUM: 143 mmol/L (ref 135–145)
Total Protein: 6.9 g/dL (ref 6.5–8.1)

## 2017-01-01 LAB — CBC WITH DIFFERENTIAL/PLATELET
BASOS ABS: 0 10*3/uL (ref 0.0–0.1)
Basophils Relative: 0 %
Eosinophils Absolute: 0.1 10*3/uL (ref 0.0–0.7)
Eosinophils Relative: 1 %
HEMATOCRIT: 40.6 % (ref 36.0–46.0)
HEMOGLOBIN: 13 g/dL (ref 12.0–15.0)
Lymphocytes Relative: 17 %
Lymphs Abs: 1.6 10*3/uL (ref 0.7–4.0)
MCH: 29.1 pg (ref 26.0–34.0)
MCHC: 32 g/dL (ref 30.0–36.0)
MCV: 90.8 fL (ref 78.0–100.0)
Monocytes Absolute: 0.9 10*3/uL (ref 0.1–1.0)
Monocytes Relative: 10 %
NEUTROS ABS: 6.9 10*3/uL (ref 1.7–7.7)
NEUTROS PCT: 72 %
Platelets: 292 10*3/uL (ref 150–400)
RBC: 4.47 MIL/uL (ref 3.87–5.11)
RDW: 15.6 % — ABNORMAL HIGH (ref 11.5–15.5)
WBC: 9.6 10*3/uL (ref 4.0–10.5)

## 2017-01-01 LAB — SEDIMENTATION RATE: Sed Rate: 24 mm/hr — ABNORMAL HIGH (ref 0–22)

## 2017-01-01 LAB — D-DIMER, QUANTITATIVE: D-Dimer, Quant: 0.77 ug/mL-FEU — ABNORMAL HIGH (ref 0.00–0.50)

## 2017-01-01 MED ORDER — ONDANSETRON HCL 4 MG/2ML IJ SOLN
4.0000 mg | Freq: Once | INTRAMUSCULAR | Status: AC
  Administered 2017-01-01: 4 mg via INTRAVENOUS
  Filled 2017-01-01: qty 2

## 2017-01-01 MED ORDER — ALPRAZOLAM 0.25 MG PO TABS
0.2500 mg | ORAL_TABLET | Freq: Every day | ORAL | Status: DC
  Administered 2017-01-01 – 2017-01-04 (×4): 0.25 mg via ORAL
  Filled 2017-01-01 (×4): qty 1

## 2017-01-01 MED ORDER — HYDROMORPHONE HCL 1 MG/ML IJ SOLN
1.0000 mg | Freq: Once | INTRAMUSCULAR | Status: AC
  Administered 2017-01-01: 1 mg via INTRAVENOUS
  Filled 2017-01-01: qty 1

## 2017-01-01 MED ORDER — SODIUM CHLORIDE 0.9% FLUSH
3.0000 mL | Freq: Two times a day (BID) | INTRAVENOUS | Status: DC
  Administered 2017-01-01 – 2017-01-05 (×8): 3 mL via INTRAVENOUS

## 2017-01-01 MED ORDER — KETOROLAC TROMETHAMINE 15 MG/ML IJ SOLN
15.0000 mg | Freq: Four times a day (QID) | INTRAMUSCULAR | Status: DC | PRN
  Administered 2017-01-01 – 2017-01-02 (×4): 15 mg via INTRAVENOUS
  Filled 2017-01-01 (×4): qty 1

## 2017-01-01 MED ORDER — ONDANSETRON HCL 4 MG/2ML IJ SOLN: 4.0000 mg | Freq: Four times a day (QID) | INTRAMUSCULAR | Status: DC | PRN

## 2017-01-01 MED ORDER — ONDANSETRON HCL 4 MG PO TABS: 4.0000 mg | ORAL_TABLET | Freq: Four times a day (QID) | ORAL | Status: DC | PRN

## 2017-01-01 MED ORDER — SODIUM CHLORIDE 0.9 % IV SOLN: INTRAVENOUS | Status: DC

## 2017-01-01 MED ORDER — ACETAMINOPHEN 650 MG RE SUPP: 650.0000 mg | Freq: Four times a day (QID) | RECTAL | Status: DC | PRN

## 2017-01-01 MED ORDER — MIRTAZAPINE 15 MG PO TABS
7.5000 mg | ORAL_TABLET | Freq: Every day | ORAL | Status: DC
  Administered 2017-01-02 – 2017-01-04 (×3): 7.5 mg via ORAL
  Filled 2017-01-01 (×4): qty 1

## 2017-01-01 MED ORDER — METOPROLOL SUCCINATE ER 25 MG PO TB24
25.0000 mg | ORAL_TABLET | Freq: Every day | ORAL | Status: DC
  Administered 2017-01-01 – 2017-01-05 (×5): 25 mg via ORAL
  Filled 2017-01-01 (×5): qty 1

## 2017-01-01 MED ORDER — METOPROLOL SUCCINATE ER 25 MG PO TB24: 625.0000 mg | ORAL_TABLET | Freq: Every day | ORAL | Status: DC

## 2017-01-01 MED ORDER — ACETAMINOPHEN 325 MG PO TABS
650.0000 mg | ORAL_TABLET | Freq: Four times a day (QID) | ORAL | Status: DC | PRN
  Administered 2017-01-02 – 2017-01-05 (×4): 650 mg via ORAL
  Filled 2017-01-01 (×5): qty 2

## 2017-01-01 MED ORDER — IPRATROPIUM-ALBUTEROL 20-100 MCG/ACT IN AERS: 1.0000 | INHALATION_SPRAY | Freq: Four times a day (QID) | RESPIRATORY_TRACT | Status: DC | PRN

## 2017-01-01 MED ORDER — IPRATROPIUM-ALBUTEROL 0.5-2.5 (3) MG/3ML IN SOLN
3.0000 mL | Freq: Four times a day (QID) | RESPIRATORY_TRACT | Status: DC | PRN
Start: 2017-01-01 — End: 2017-01-05

## 2017-01-01 MED ORDER — ESCITALOPRAM OXALATE 10 MG PO TABS
10.0000 mg | ORAL_TABLET | Freq: Every day | ORAL | Status: DC
  Administered 2017-01-02 – 2017-01-05 (×4): 10 mg via ORAL
  Filled 2017-01-01 (×5): qty 1

## 2017-01-01 MED ORDER — PREDNISONE 10 MG PO TABS
10.0000 mg | ORAL_TABLET | Freq: Every day | ORAL | Status: DC
  Administered 2017-01-02 – 2017-01-05 (×4): 10 mg via ORAL
  Filled 2017-01-01 (×4): qty 1

## 2017-01-01 MED ORDER — APIXABAN 2.5 MG PO TABS
2.5000 mg | ORAL_TABLET | Freq: Two times a day (BID) | ORAL | Status: DC
  Administered 2017-01-01: 2.5 mg via ORAL
  Filled 2017-01-01: qty 1

## 2017-01-01 MED ORDER — MOMETASONE FURO-FORMOTEROL FUM 200-5 MCG/ACT IN AERO
2.0000 | INHALATION_SPRAY | Freq: Two times a day (BID) | RESPIRATORY_TRACT | Status: DC
  Administered 2017-01-01 – 2017-01-05 (×6): 2 via RESPIRATORY_TRACT
  Filled 2017-01-01 (×2): qty 8.8

## 2017-01-01 MED ORDER — HYDROCODONE-ACETAMINOPHEN 5-325 MG PO TABS
1.0000 | ORAL_TABLET | ORAL | Status: DC | PRN
  Administered 2017-01-01 – 2017-01-03 (×3): 1 via ORAL
  Administered 2017-01-04 (×2): 2 via ORAL
  Filled 2017-01-01: qty 2
  Filled 2017-01-01 (×2): qty 1
  Filled 2017-01-01 (×2): qty 2

## 2017-01-01 MED ORDER — SODIUM CHLORIDE 0.9 % IV BOLUS (SEPSIS)
250.0000 mL | Freq: Once | INTRAVENOUS | Status: AC
  Administered 2017-01-01: 250 mL via INTRAVENOUS

## 2017-01-01 NOTE — ED Triage Notes (Signed)
This pt., with assist from her daughter tell me pt. Has has polymyalgia rheumatica for about two years. She is here today with c/o multiple aches/pain. She states this current problem set began when she "bumped her (left) foot" about three weeks ago. She saw her pcp, who dx with a pmr flare and rx with double-dose steroids for a few days with no resultant relief. She also c/o a congested cough. Pt. Is awake, alert and in no distress. She has numerous sm. Areas of ecchymoses in various stages of healing. She is also on Eliquis for a-fib.

## 2017-01-01 NOTE — ED Notes (Signed)
Pt tried ambulating to bedside commode and O2 dropped to 77-79 and could not put any weight on left leg because of pain

## 2017-01-01 NOTE — ED Provider Notes (Signed)
[facility] DEPT Provider Note   CSN: [id number] Arrival date & time: 01/01/17  1028     History   Chief Complaint Chief Complaint  Patient presents with  . Generalized Body Aches    HPI [name] is a 83 y.o. female.  Patient with worsening of pain to her low back left leg greater than right leg. Particularly left knee left ankle area. Also with pain in her right leg as well. Patient also had a cough and has been feeling more short of breath. Patient has a history of polymyalgia rheumatica has been seen by her primary care provider several times your of late. Was recently started on tramadol. But is not helping to control the pain. Patient also had a short course of prednisone burst which did not improve it either. Patient is currently taking 10 mg prednisone daily.      Past Medical History:  Diagnosis Date  . CHB (complete heart block) ([place]) 07/13/2013   Pacemaker dependent  . CHF (congestive heart failure) ([place])   . COPD (chronic obstructive pulmonary disease) ([place])   . DM (dermatomyositis)   . Orthostatic hypotension   . Pacemaker 07/13/2013   Her original pacemaker and the current leads were implanted in 1992. She has had 2 generator change out, most recently in 2010. Her device is a Buyer, retail 2110 non RF dual-chamber pacemaker with a battery longevity estimated at about 7 years. The atrial lead is a St. Jude [id number] and the ventricular lead was a Biotronik [id number].   [name] Paroxysmal atrial fibrillation ([place]) 09/04/2014  . Polymyalgia rheumatica ([place])   . Scoliosis   . SSS (sick sinus syndrome) ([place]) 07/13/2013  . Syncope   . Systemic hypertension   . Thoracic kyphosis     Patient Active Problem List   Diagnosis Date Noted  . Polymyalgia rheumatica ([place])   . Postural dizziness with near syncope   . Essential hypertension 10/11/2016  . Syncope 10/11/2016  . Postural dizziness with presyncope 10/11/2016  . Acute hypoxemic respiratory failure ([place]) 10/05/2016    . Chronic diastolic CHF (congestive heart failure) ([place]) 10/05/2016  . Smoker 10/05/2016  . Closed multiple fractures of right upper extremity with ribs with routine healing 10/05/2016  . HCAP (healthcare-associated pneumonia) 10/31/2015  . COPD (chronic obstructive pulmonary disease) ([place]) 10/31/2015  . Leukocytosis 10/31/2015  . Benign essential HTN 10/31/2015  . Anxiety state   . Acute respiratory failure with hypoxia ([place]) 08/26/2015  . COPD exacerbation ([place]) 08/26/2015  . Diastolic CHF ([place]) 29/79/8921  . Systemic hypertension 08/26/2015  . Hypokalemia 08/26/2015  . Anticoagulant long-term use 08/26/2015  . DM (dermatomyositis) 08/26/2015  . Acute on chronic diastolic CHF (congestive heart failure), NYHA class 1 ([place])   . Sepsis ([place]) 06/07/2015  . CAP (community acquired pneumonia) 06/07/2015  . Acute CHF (congestive heart failure) ([place]) 06/07/2015  . Paroxysmal atrial fibrillation ([place]) 09/04/2014  . Pacemaker 07/13/2013  . CHB (complete heart block) ([place]) 07/13/2013  . SSS (sick sinus syndrome) ([place]) 07/13/2013  . Orthostatic hypotension 07/13/2013  . Aortic insufficiency 07/13/2013    Past Surgical History:  Procedure Laterality Date  . NM MYOCAR PERF WALL MOTION  09/13/2011   Low risk  . PERMANENT PACEMAKER GENERATOR CHANGE  03/27/2009   St.Jude  . US ECHOCARDIOGRAPHY  03/28/2012   Mod LAE,mild MR,aortic sclerosis w/mod AI,mod. TR,mild PI,Stage I diastolic dysfunction  . VIDEO BRONCHOSCOPY Bilateral 10/07/2016   Procedure: VIDEO BRONCHOSCOPY WITH FLUORO;  Surgeon: Marshell Garfinkel, MD;  Location:  [facility];  Service: Cardiopulmonary;  Laterality: Bilateral;    OB History    No data available       Home Medications    Prior to Admission medications   Medication Sig Start Date End Date Taking? Authorizing Provider  alendronate (FOSAMAX) 70 MG tablet Take 70 mg by mouth every Saturday. Take with a full glass of water on an empty stomach.   Yes [provider]  ALPRAZolam (XANAX) 0.25 MG tablet Take 0.25 mg by mouth at bedtime.    Yes [provider]  budesonide-formoterol (SYMBICORT) 160-4.5 MCG/ACT inhaler Inhale 2 puffs into the lungs 2 (two) times daily. 11/09/16 01/01/18 Yes Mannam, Praveen, MD  ELIQUIS 2.5 MG TABS tablet TAKE 1 TABLET(2.5 MG) BY MOUTH TWICE DAILY 12/23/16  Yes Croitoru, Mihai, MD  escitalopram (LEXAPRO) 10 MG tablet Take 10 mg by mouth daily.   Yes [provider]  furosemide (LASIX) 40 MG tablet Take 40 mg by mouth daily as needed (for weight gain of greater than 2lbs).   Yes [provider]  metoprolol succinate (TOPROL-XL) 25 MG 24 hr tablet Take 25 tablets by mouth daily.    Yes [provider]  mirtazapine (REMERON) 15 MG tablet Take 7.5 mg by mouth at bedtime.    Yes [provider]  potassium chloride SA (K-DUR,KLOR-CON) 20 MEQ tablet Take 20 mEq by mouth daily as needed (when taking Lasix).    Yes [provider]  predniSONE (DELTASONE) 10 MG tablet Take 10 mg by mouth daily.    Yes [provider]  Ipratropium-Albuterol (COMBIVENT RESPIMAT) 20-100 MCG/ACT AERS respimat Inhale 1 puff into the lungs every 6 (six) hours as needed for wheezing or shortness of breath. 08/29/15   Barton Dubois, MD  midodrine (PROAMATINE) 2.5 MG tablet Take 2.5 mg by mouth as needed (when systolic BP is lower than 100).     [provider]    Family History Family History  Problem Relation Age of Onset  . Stroke Mother     Social History Social History  Substance Use Topics  . Smoking status: Former Smoker    Packs/day: 0.50    Years: 60.00    Types: Cigarettes  . Smokeless tobacco: Never Used     Comment: quit 10/10/16--11/09/16  . Alcohol use No     Allergies   Patient has no known allergies.   Review of Systems Review of Systems  Constitutional: Negative for fever.  HENT: Negative for congestion.   Eyes: Negative for visual disturbance.    Respiratory: Positive for cough and shortness of breath.   Cardiovascular: Positive for leg swelling. Negative for chest pain.  Gastrointestinal: Negative for abdominal pain.  Genitourinary: Negative for dysuria.  Musculoskeletal: Positive for arthralgias, back pain and myalgias.  Skin: Negative for rash.  Neurological: Negative for headaches.  Hematological: Bruises/bleeds easily.  Psychiatric/Behavioral: Negative for confusion.     Physical Exam Updated Vital Signs BP (!) 163/70 (BP Location: Left Arm)   Pulse 74   Temp 98.5 F (36.9 C) (Oral)   Resp 13   Ht 1.651 m (5\' 5" )   Wt 59 kg (130 lb)   SpO2 98%   BMI 21.63 kg/m   Physical Exam  Constitutional: She is oriented to person, place, and time. She appears well-developed and well-nourished. No distress.  HENT:  Head: Normocephalic and atraumatic.  Mouth/Throat: Oropharynx is clear and moist.  Eyes: Pupils are equal, round, and reactive to light. Conjunctivae and EOM are normal.  Neck: Normal range of motion. Neck supple.  Cardiovascular: Normal rate and regular rhythm.   Pulmonary/Chest: Effort normal and breath sounds normal. No respiratory distress. She has no wheezes.  Abdominal: Soft.  Musculoskeletal: Normal range of motion.   bruising on both lower extremities. Swelling to left ankle and foot. Cap refill to distal toes is intact.  Neurological: She is alert and oriented to person, place, and time. No cranial nerve deficit or sensory deficit. She exhibits normal muscle tone. Coordination normal.  Skin: Skin is warm. No rash noted.  Nursing note and vitals reviewed.    ED Treatments / Results  Labs (all labs ordered are listed, but only abnormal results are displayed) Labs Reviewed  CBC WITH DIFFERENTIAL/PLATELET - Abnormal; Notable for the following:       Result Value   RDW 15.6 (*)    All other components within normal limits  COMPREHENSIVE METABOLIC PANEL - Abnormal; Notable for the following:     Calcium 8.7 (*)    Total Bilirubin 1.3 (*)    All other components within normal limits  SEDIMENTATION RATE - Abnormal; Notable for the following:    Sed Rate 24 (*)    All other components within normal limits    EKG  EKG Interpretation  Date/Time:  Sunday January 01 2017 12:11:31 EDT Ventricular Rate:  70 PR Interval:    QRS Duration: 174 QT Interval:  431 QTC Calculation: 466 R Axis:   -61 Text Interpretation:  Atrial-ventricular dual-paced rhythm No further analysis attempted due to paced rhythm Confirmed by Fredia Sorrow [phone]) on 01/01/2017 12:25:04 PM       Radiology Dg Ankle Complete Left  Result Date: 01/01/2017 CLINICAL DATA:  Left ankle pain since hitting her foot on something 3 weeks ago. EXAM: LEFT ANKLE COMPLETE - 3+ VIEW COMPARISON:  None. FINDINGS: Mild-to-moderate diffuse lateral soft tissue swelling. Mild talofibular spur formation. No fracture, dislocation or effusion seen. IMPRESSION: No fracture. Electronically Signed   By: Claudie Revering M.D.   On: 01/01/2017 11:46   Ct Chest Wo Contrast  Result Date: 01/01/2017 CLINICAL DATA:  Severe LEFT leg pain, low back and upper back pain, progressive pain over 3 days, cough and congestion, history pneumonia, benign essential hypertension, COPD, dermatomyositis, atrial fibrillation, CHF, polymyalgia rheumatica, former smoker EXAM: CT CHEST WITHOUT CONTRAST TECHNIQUE: Multidetector CT imaging of the chest was performed following the standard protocol without IV contrast. Sagittal and coronal MPR images reconstructed from axial data set. Respiratory motion artifacts degrade exam. COMPARISON:  CT chest 10/05/2016 FINDINGS: Cardiovascular: Atherosclerotic calcifications aorta, proximal great vessels and coronary arteries. Cardiac chambers appear mildly dilated. No pericardial effusion. Sequential pacemaker leads via RIGHT subclavian approach at RIGHT ventricle and RIGHT atrium. Mediastinum/Nodes: Scattered normal sized mediastinal  lymph nodes. No thoracic adenopathy. Esophagus unremarkable. Base of cervical region normal appearance Lungs/Pleura: Small BILATERAL pleural effusions. Hyperinflated lungs with scattered peribronchial thickening and diffuse interseptal thickening unchanged. Subsegmental atelectasis in BILATERAL lower lobes, lingula, and RIGHT middle lobe. Calcified granuloma RIGHT middle lobe. Scattered tiny nodules in periphery of RIGHT lung, some stable, additional potentially obscured by infiltrates on previous exam. No definite enlarging pulmonary mass or nodule. No segmental consolidation or pneumothorax. Upper Abdomen: Multiple hepatic cysts, better characterized on previous exam which last motion artifacts, grossly stable in appearance. Probable peripelvic cyst upper pole LEFT kidney. Remaining visualized upper abdomen unremarkable. Musculoskeletal: Sclerosis at lateral RIGHT third fourth fifth sixth and seventh ribs from healing fractures. Diffuse osseous demineralization. Interval acute compression fracture of  T9 vertebral body with 25% anterior height loss. IMPRESSION: Improved pulmonary infiltrates particularly in RIGHT lower lobe and RIGHT middle lobe since previous exam. Scattered subsegmental atelectasis and bronchitic changes with small BILATERAL pleural effusions. Hepatic cysts. Osseous demineralization with interval compression fracture of T9 vertebral body with approximately 25% anterior height loss. Tiny peripheral nonspecific nodular foci RIGHT lung, recommendation below. No follow-up needed if patient is low-risk (and has no known or suspected primary neoplasm). Non-contrast chest CT can be considered in 12 months if patient is high-risk. This recommendation follows the consensus statement: Guidelines for Management of Incidental Pulmonary Nodules Detected on CT Images: From the Fleischner Society 2017; Radiology 2017; 284:228-243. Aortic Atherosclerosis (ICD10-I70.0) and Emphysema (ICD10-J43.9). Electronically  Signed   By: Lavonia Dana M.D.   On: 01/01/2017 12:12   Ct Lumbar Spine Wo Contrast  Result Date: 01/01/2017 CLINICAL DATA:  Severe LEFT leg pain, and low back pain. Chronic but acutely progressive. EXAM: CT LUMBAR SPINE WITHOUT CONTRAST TECHNIQUE: Multidetector CT imaging of the lumbar spine was performed without intravenous contrast administration. Multiplanar CT image reconstructions were also generated. COMPARISON:  CT lumbar spine 05/24/2005. FINDINGS: Segmentation: Standard Alignment: Normal. Vertebrae: No acute fracture or focal pathologic process. Incompletely visualized Tarlov cyst at S2 appears to result in bony remodeling. Paraspinal and other soft tissues: Aortic atherosclerosis. No masses. Disc levels: L1-L2: Fairly good preservation of disc height. Osseous spurring. No definite impingement. L2-L3: Fairly good preservation of disc height. Osseous spurring facet arthropathy. Broad-based disc protrusion. LEFT subarticular zone and foraminal zone narrowing. LEFT L2 and LEFT L3 nerve root impingement. Mild stenosis. L3-L4: Disc space narrowing, worse on the LEFT. Osseous spurring. Broad-based disc protrusion central and to the LEFT, partially calcified. Facet arthropathy. Moderate to severe stenosis. BILATERAL LEFT greater than RIGHT L3 and L4 nerve root impingement. L4-L5: Disc space narrowing, worse on the RIGHT. Osseous spurring. Facet arthropathy. Central disc protrusion, partially calcified, eccentric to the RIGHT. Severe stenosis. RIGHT greater than LEFT L4 and L5 nerve root impingement. L5-S1: Disc space narrowing. Central protrusion. Posterior element hypertrophy. Mild stenosis. RIGHT greater than LEFT L5 and S1 nerve root impingement. Compared with study 12 years ago, there is progression of disc disease at all levels. IMPRESSION: Advanced multilevel spondylosis. Potentially symptomatic neural impingement at multiple levels; LEFT-sided predominant at L2-3 and L3-4; see comments above. No acute  compression fracture or traumatic subluxation. Electronically Signed   By: Staci Righter M.D.   On: 01/01/2017 12:10   Dg Foot Complete Left  Result Date: 01/01/2017 CLINICAL DATA:  Left foot pain since hitting her foot on something 3 weeks ago. EXAM: LEFT FOOT - COMPLETE 3+ VIEW COMPARISON:  Left ankle radiographs obtained at the same time. FINDINGS: Hallux valgus. Calcifications medial to the first metatarsal head and in the plantar soft tissues of the proximal foot. No fracture or dislocation seen. Minimal inferior calcaneal spur formation. IMPRESSION: No fracture. Electronically Signed   By: Claudie Revering M.D.   On: 01/01/2017 11:48    Procedures Procedures (including critical care time)  Medications Ordered in ED Medications  0.9 %  sodium chloride infusion ( Intravenous Rate/Dose Change 01/01/17 1322)  sodium chloride 0.9 % bolus 250 mL (0 mLs Intravenous Stopped 01/01/17 1319)  ondansetron (ZOFRAN) injection 4 mg (4 mg Intravenous Given 01/01/17 1158)  HYDROmorphone (DILAUDID) injection 1 mg (1 mg Intravenous Given 01/01/17 1158)     Initial Impression / Assessment and Plan / ED Course  I have reviewed the triage vital signs and the  nursing notes.  Pertinent labs & imaging results that were available during my care of the patient were reviewed by me and considered in my medical decision making (see chart for details).   patient with a history of polymyalgia rheumatica for the past 2 years. Patient's been having some increased difficulties with extremity pain and back pain over the last of few weeks. Has been seen by her primary care doctor several times this week. Patient had a history of pneumonia with hospitalization back in April. Still followed by pulmonary medicine. Patient was initially sent home on oxygen following that hospitalization. But has no longer had an oxygen requirement. However patient did arrive here today with hypoxia. And so was started on 2 L of oxygen. Patient's  oxygen saturations on that were in the upper 90s to 100%.  Patient's main complaints was back pain and leg pain had some swelling to the left ankle area. Her primary x-ray her knee and hip on the left side. Had not x-rayed her foot and ankle.  Here today patient had CT of chest without done because pulmonary plan to that the end of the week and the door one to make sure the pneumonia was better. That shows no evidence of pulmonary edema. She shows improvement significant improvement with the pneumonia. I had x-ray of her left ankle and left foot without any bony abnormalities. Patient did state that she did bump that left ankle several weeks ago but there was nothing real significant that she would've considered that it would be any fracture. Patient also had CT of her lumbar back. Which showed multiple degenerative changes and plenty explanation for pain there but nothing acute no acute compression fractures.  The patient improved with pain medicine and got her up to standard see if she be able to go home. Patient still had a lot of leg pain did not able to bear weight. Well. Predominantly with the left leg and pain in the left knee area. In her oxygen saturations dropped down to 74%. Stayed there for a while. Patient was restarted on 2 L of oxygen sats came back up.  Clinically was initially not concerned about pulmonary embolus and this seemed to be a good history for but don't have good explanation for the hypoxia. Less patient requires long-term oxygen.  Patient also had a sedimentation rate done and it was 24 only slightly above normal so is unlikely that this is related to any kind of connective tissue disorder as the cause of the pain at this time.  Patient will be admitted by hospitalist and may require transfer to rehabilitation facility.  Patient has a pacemaker. And patient is on blood thinners. In addition patient has had a past history of CHF. Patient has the pacemaker for complete heart  block.          Final Clinical Impressions(s) / ED Diagnoses   Final diagnoses:  Polymyalgia rheumatica ([place])  Myalgia  Hypoxia    New Prescriptions New Prescriptions   No medications on file     Fredia Sorrow, MD 01/01/17 [phone]

## 2017-01-01 NOTE — H&P (Signed)
History and Physical    [name] ZOX:[id number] DOB: 08-28-1930 DOA: 01/01/2017  PCP: Lajean Manes, MD  Patient coming from: Home  Chief Complaint: Left leg pain, back pain  HPI: [name] is a 83 y.o. female with medical history significant of A Fib on Eliquis, complete heart block s/p pacemaker, CHF, COPD, polymyalgia rheumatica on chronic steroids, HTN who presents with few week history of left leg pain. She states that she was in of normal health and normal mobility when she stubbed her left great toe. Since then, she has had significant left ankle pain that has limited her mobility, and now is using a walker. She was evaluated at her PCP office to thought this may be PMR flareup. At that time, her steroid dose was doubled, however, she did not have any relief. She also admits to back pain, but thought that it was due to laying around. She denies any fevers or chills, chest pain or shortness of breath, cough, nausea, vomiting, diarrhea or abdominal pain, dysuria. Even though she was found to be hypoxemic in the emergency department, she denies any symptomatic shortness of breath or labored breathing. She currently lives alone at home, but due to her decreased mobility as well as pain, she does not feel to be at home alone.  ED Course: Left ankle and left foot x-ray was completed without acute abnormality, it did show mild-to-moderate diffuse lateral soft tissue swelling. CT chest without contrast revealed improving pneumonia (she had been previously admitted at the hospital in April for pneumonia) , as well as interval compression fracture of T9 vertebral body with 25%, anterior height loss.  Review of Systems: As per HPI otherwise 10 point review of systems negative.   Past Medical History:  Diagnosis Date  . CHB (complete heart block) ([place]) 07/13/2013   Pacemaker dependent  . CHF (congestive heart failure) ([place])   . COPD (chronic obstructive pulmonary disease) ([place])   . DM  (dermatomyositis)   . Orthostatic hypotension   . Pacemaker 07/13/2013   Her original pacemaker and the current leads were implanted in 1992. She has had 2 generator change out, most recently in 2010. Her device is a Buyer, retail 2110 non RF dual-chamber pacemaker with a battery longevity estimated at about 7 years. The atrial lead is a St. Jude [id number] and the ventricular lead was a Biotronik [id number].   [name] Paroxysmal atrial fibrillation ([place]) 09/04/2014  . Polymyalgia rheumatica ([place])   . Scoliosis   . SSS (sick sinus syndrome) ([place]) 07/13/2013  . Syncope   . Systemic hypertension   . Thoracic kyphosis     Past Surgical History:  Procedure Laterality Date  . NM MYOCAR PERF WALL MOTION  09/13/2011   Low risk  . PERMANENT PACEMAKER GENERATOR CHANGE  03/27/2009   [employer]ude  . US ECHOCARDIOGRAPHY  03/28/2012   Mod LAE,mild MR,aortic sclerosis w/mod AI,mod. TR,mild PI,Stage I diastolic dysfunction  . VIDEO BRONCHOSCOPY Bilateral 10/07/2016   Procedure: VIDEO BRONCHOSCOPY WITH FLUORO;  Surgeon: Marshell Garfinkel, MD;  Location: [facility];  Service: Cardiopulmonary;  Laterality: Bilateral;     reports that she has quit smoking. Her smoking use included Cigarettes. She has a 30.00 pack-year smoking history. She has never used smokeless tobacco. She reports that she does not drink alcohol or use drugs.  No Known Allergies  Family History  Problem Relation Age of Onset  . Stroke Mother     Prior to Admission medications   Medication Sig Start Date  End Date Taking? Authorizing Provider  alendronate (FOSAMAX) 70 MG tablet Take 70 mg by mouth every Saturday. Take with a full glass of water on an empty stomach.   Yes [provider]  ALPRAZolam (XANAX) 0.25 MG tablet Take 0.25 mg by mouth at bedtime.    Yes [provider]  budesonide-formoterol (SYMBICORT) 160-4.5 MCG/ACT inhaler Inhale 2 puffs into the lungs 2 (two) times daily. 11/09/16 01/01/18 Yes Mannam, Praveen, MD  ELIQUIS 2.5  MG TABS tablet TAKE 1 TABLET(2.5 MG) BY MOUTH TWICE DAILY 12/23/16  Yes Croitoru, Mihai, MD  escitalopram (LEXAPRO) 10 MG tablet Take 10 mg by mouth daily.   Yes [provider]  furosemide (LASIX) 40 MG tablet Take 40 mg by mouth daily as needed (for weight gain of greater than 2lbs).   Yes [provider]  metoprolol succinate (TOPROL-XL) 25 MG 24 hr tablet Take 25 tablets by mouth daily.    Yes [provider]  mirtazapine (REMERON) 15 MG tablet Take 7.5 mg by mouth at bedtime.    Yes [provider]  potassium chloride SA (K-DUR,KLOR-CON) 20 MEQ tablet Take 20 mEq by mouth daily as needed (when taking Lasix).    Yes [provider]  predniSONE (DELTASONE) 10 MG tablet Take 10 mg by mouth daily.    Yes [provider]  Ipratropium-Albuterol (COMBIVENT RESPIMAT) 20-100 MCG/ACT AERS respimat Inhale 1 puff into the lungs every 6 (six) hours as needed for wheezing or shortness of breath. 08/29/15   Barton Dubois, MD  midodrine (PROAMATINE) 2.5 MG tablet Take 2.5 mg by mouth as needed (when systolic BP is lower than 100).     [provider]    Physical Exam: Vitals:   01/01/17 1036 01/01/17 1220 01/01/17 1400 01/01/17 1602  BP: (!) 169/85 (!) 148/62 (!) 163/70 (!) 158/73  Pulse: 77 73 74 75  Resp: 16 13 13  (!) 24  Temp: 98.5 F (36.9 C)     TempSrc: Oral     SpO2: 100% 94% 98% 97%  Weight: 59 kg (130 lb)     Height: 5\' 5"  (1.651 m)       Constitutional: NAD, calm, comfortable Eyes: PERRL, lids and conjunctivae normal ENMT: Mucous membranes are moist. Posterior pharynx clear of any exudate or lesions.Normal dentition.  Neck: normal, supple, no masses, no thyromegaly Respiratory: clear to auscultation bilaterally, diminished on right, no wheezing, no crackles. Normal respiratory effort. No accessory muscle use. On [place] O2  Cardiovascular: Regular rate and rhythm, no murmurs / rubs / gallops. Abdomen: no tenderness, no masses  palpated. No hepatosplenomegaly. Bowel sounds positive.  Musculoskeletal: no clubbing / cyanosis. No joint deformity upper and lower extremities. Good ROM. No abnormalities of feet, +1 edema left ankle  Skin: no rashes, lesions, ulcers. No induration Neurologic: CN 2-12 grossly intact. Sensation intact. Strength 5/5 in all 4.  Psychiatric: Normal judgment and insight. Alert and oriented x 3. Normal mood.   Labs on Admission: I have personally reviewed following labs and imaging studies  CBC:  Recent Labs Lab 01/01/17 1155  WBC 9.6  NEUTROABS 6.9  HGB 13.0  HCT 40.6  MCV 90.8  PLT 532   Basic Metabolic Panel:  Recent Labs Lab 01/01/17 1155  NA 143  K 3.6  CL 105  CO2 29  GLUCOSE 89  BUN 18  CREATININE 0.78  CALCIUM 8.7*   GFR: Estimated Creatinine Clearance: 47.9 mL/min (by C-G formula based on SCr of 0.78 mg/dL). Liver Function Tests:  Recent Labs Lab 01/01/17 1155  AST 22  ALT 15  ALKPHOS 84  BILITOT 1.3*  PROT 6.9  ALBUMIN 3.6   No results for input(s): LIPASE, AMYLASE in the last 168 hours. No results for input(s): AMMONIA in the last 168 hours. Coagulation Profile: No results for input(s): INR, PROTIME in the last 168 hours. Cardiac Enzymes: No results for input(s): CKTOTAL, CKMB, CKMBINDEX, TROPONINI in the last 168 hours. BNP (last 3 results) No results for input(s): PROBNP in the last 8760 hours. HbA1C: No results for input(s): HGBA1C in the last 72 hours. CBG: No results for input(s): GLUCAP in the last 168 hours. Lipid Profile: No results for input(s): CHOL, HDL, LDLCALC, TRIG, CHOLHDL, LDLDIRECT in the last 72 hours. Thyroid Function Tests: No results for input(s): TSH, T4TOTAL, FREET4, T3FREE, THYROIDAB in the last 72 hours. Anemia Panel: No results for input(s): VITAMINB12, FOLATE, FERRITIN, TIBC, IRON, RETICCTPCT in the last 72 hours. Urine analysis:    Component Value Date/Time   COLORURINE YELLOW 10/11/2016 0033   APPEARANCEUR CLEAR  10/11/2016 0033   LABSPEC 1.016 10/11/2016 0033   PHURINE 6.0 10/11/2016 0033   GLUCOSEU NEGATIVE 10/11/2016 0033   HGBUR SMALL (A) 10/11/2016 0033   BILIRUBINUR NEGATIVE 10/11/2016 0033   KETONESUR NEGATIVE 10/11/2016 0033   PROTEINUR NEGATIVE 10/11/2016 0033   UROBILINOGEN 1.0 01/19/2011 1030   NITRITE NEGATIVE 10/11/2016 0033   LEUKOCYTESUR NEGATIVE 10/11/2016 0033   Sepsis Labs: !!!!!!!!!!!!!!!!!!!!!!!!!!!!!!!!!!!!!!!!!!!! @LABRCNTIP (procalcitonin:4,lacticidven:4) )No results found for this or any previous visit (from the past 240 hour(s)).   Radiological Exams on Admission: Dg Ankle Complete Left  Result Date: 01/01/2017 CLINICAL DATA:  Left ankle pain since hitting her foot on something 3 weeks ago. EXAM: LEFT ANKLE COMPLETE - 3+ VIEW COMPARISON:  None. FINDINGS: Mild-to-moderate diffuse lateral soft tissue swelling. Mild talofibular spur formation. No fracture, dislocation or effusion seen. IMPRESSION: No fracture. Electronically Signed   By: Claudie Revering M.D.   On: 01/01/2017 11:46   Ct Chest Wo Contrast  Result Date: 01/01/2017 CLINICAL DATA:  Severe LEFT leg pain, low back and upper back pain, progressive pain over 3 days, cough and congestion, history pneumonia, benign essential hypertension, COPD, dermatomyositis, atrial fibrillation, CHF, polymyalgia rheumatica, former smoker EXAM: CT CHEST WITHOUT CONTRAST TECHNIQUE: Multidetector CT imaging of the chest was performed following the standard protocol without IV contrast. Sagittal and coronal MPR images reconstructed from axial data set. Respiratory motion artifacts degrade exam. COMPARISON:  CT chest 10/05/2016 FINDINGS: Cardiovascular: Atherosclerotic calcifications aorta, proximal great vessels and coronary arteries. Cardiac chambers appear mildly dilated. No pericardial effusion. Sequential pacemaker leads via RIGHT subclavian approach at RIGHT ventricle and RIGHT atrium. Mediastinum/Nodes: Scattered normal sized mediastinal  lymph nodes. No thoracic adenopathy. Esophagus unremarkable. Base of cervical region normal appearance Lungs/Pleura: Small BILATERAL pleural effusions. Hyperinflated lungs with scattered peribronchial thickening and diffuse interseptal thickening unchanged. Subsegmental atelectasis in BILATERAL lower lobes, lingula, and RIGHT middle lobe. Calcified granuloma RIGHT middle lobe. Scattered tiny nodules in periphery of RIGHT lung, some stable, additional potentially obscured by infiltrates on previous exam. No definite enlarging pulmonary mass or nodule. No segmental consolidation or pneumothorax. Upper Abdomen: Multiple hepatic cysts, better characterized on previous exam which last motion artifacts, grossly stable in appearance. Probable peripelvic cyst upper pole LEFT kidney. Remaining visualized upper abdomen unremarkable. Musculoskeletal: Sclerosis at lateral RIGHT third fourth fifth sixth and seventh ribs from healing fractures. Diffuse osseous demineralization. Interval acute compression fracture of T9 vertebral body with 25% anterior height loss. IMPRESSION: Improved pulmonary infiltrates  particularly in RIGHT lower lobe and RIGHT middle lobe since previous exam. Scattered subsegmental atelectasis and bronchitic changes with small BILATERAL pleural effusions. Hepatic cysts. Osseous demineralization with interval compression fracture of T9 vertebral body with approximately 25% anterior height loss. Tiny peripheral nonspecific nodular foci RIGHT lung, recommendation below. No follow-up needed if patient is low-risk (and has no known or suspected primary neoplasm). Non-contrast chest CT can be considered in 12 months if patient is high-risk. This recommendation follows the consensus statement: Guidelines for Management of Incidental Pulmonary Nodules Detected on CT Images: From the Fleischner Society 2017; Radiology 2017; 284:228-243. Aortic Atherosclerosis (ICD10-I70.0) and Emphysema (ICD10-J43.9). Electronically  Signed   By: Lavonia Dana M.D.   On: 01/01/2017 12:12   Ct Lumbar Spine Wo Contrast  Result Date: 01/01/2017 CLINICAL DATA:  Severe LEFT leg pain, and low back pain. Chronic but acutely progressive. EXAM: CT LUMBAR SPINE WITHOUT CONTRAST TECHNIQUE: Multidetector CT imaging of the lumbar spine was performed without intravenous contrast administration. Multiplanar CT image reconstructions were also generated. COMPARISON:  CT lumbar spine 05/24/2005. FINDINGS: Segmentation: Standard Alignment: Normal. Vertebrae: No acute fracture or focal pathologic process. Incompletely visualized Tarlov cyst at S2 appears to result in bony remodeling. Paraspinal and other soft tissues: Aortic atherosclerosis. No masses. Disc levels: L1-L2: Fairly good preservation of disc height. Osseous spurring. No definite impingement. L2-L3: Fairly good preservation of disc height. Osseous spurring facet arthropathy. Broad-based disc protrusion. LEFT subarticular zone and foraminal zone narrowing. LEFT L2 and LEFT L3 nerve root impingement. Mild stenosis. L3-L4: Disc space narrowing, worse on the LEFT. Osseous spurring. Broad-based disc protrusion central and to the LEFT, partially calcified. Facet arthropathy. Moderate to severe stenosis. BILATERAL LEFT greater than RIGHT L3 and L4 nerve root impingement. L4-L5: Disc space narrowing, worse on the RIGHT. Osseous spurring. Facet arthropathy. Central disc protrusion, partially calcified, eccentric to the RIGHT. Severe stenosis. RIGHT greater than LEFT L4 and L5 nerve root impingement. L5-S1: Disc space narrowing. Central protrusion. Posterior element hypertrophy. Mild stenosis. RIGHT greater than LEFT L5 and S1 nerve root impingement. Compared with study 12 years ago, there is progression of disc disease at all levels. IMPRESSION: Advanced multilevel spondylosis. Potentially symptomatic neural impingement at multiple levels; LEFT-sided predominant at L2-3 and L3-4; see comments above. No acute  compression fracture or traumatic subluxation. Electronically Signed   By: Staci Righter M.D.   On: 01/01/2017 12:10   Dg Foot Complete Left  Result Date: 01/01/2017 CLINICAL DATA:  Left foot pain since hitting her foot on something 3 weeks ago. EXAM: LEFT FOOT - COMPLETE 3+ VIEW COMPARISON:  Left ankle radiographs obtained at the same time. FINDINGS: Hallux valgus. Calcifications medial to the first metatarsal head and in the plantar soft tissues of the proximal foot. No fracture or dislocation seen. Minimal inferior calcaneal spur formation. IMPRESSION: No fracture. Electronically Signed   By: Claudie Revering M.D.   On: 01/01/2017 11:48    EKG: Independently reviewed. Paced rhythm   Assessment/Plan Active Problems:   Acute hypoxemic respiratory failure (HCC)   Acute hypoxemic respiratory failure -Unclear etiology. No symptomatic complaints but O2 level fell to 70s in the ED on room air with improvement on [place] O2. Possibly PE, due to patient's immobility over the past few weeks, although she is currently on Eliquis for A Fib -CT chest showed improving pulmonary infiltrates from her pneumonia dx in April  -Check d-dimer  Compression fracture T9 -Supportive care, pain control -If intractable, could involve IR for kyphoplasty, but currently she  states her back pain is well controlled  -PT OT eval   Left ankle pain -Xray without bony abnormality. Showed soft tissue swelling. Could possibly be due to ankle sprain? Cannot perform MRI due to pacemaker  -Supportive care   PMR -Continue chronic prednisone dose   Depression/Anxiety -Continue home lexapro, xanax, remeron   HTN -Continue toprol   Paroxysmal A Fib -Continue eliquis   Complete heart block s/p pacemaker  -Continue cardiac monitoring   COPD  -Continue Dulera    DVT prophylaxis: On Eliquis Code Status: Full  Family Communication: At bedside Disposition Plan: Pending improvement, PT OT evaluation  Consults called: None     Severity of Illness: The appropriate patient status for this patient is OBSERVATION. Observation status is judged to be reasonable and necessary in order to provide the required intensity of service to ensure the patient's safety. The patient's presenting symptoms, physical exam findings, and initial radiographic and laboratory data in the context of their medical condition is felt to place them at decreased risk for further clinical deterioration. Furthermore, it is anticipated that the patient will be medically stable for discharge from the hospital within 2 midnights of admission. The following factors support the patient status of observation.   " The patient's presenting symptoms include left ankle pain, back pain. " The physical exam findings include left ankle swelling as well as hypoxemia on room air.  " The initial radiographic and laboratory data are positive for compression fracture T9.    Dessa Phi, DO [facility] [email or web address] Password [facility] 01/01/2017, 4:38 PM

## 2017-01-01 NOTE — ED Notes (Signed)
I have just given report to April on [address].

## 2017-01-01 NOTE — ED Notes (Signed)
Bed: CK22 Expected date: 01/01/17 Expected time: 10:23 AM Means of arrival: Ambulance Comments: Stumped toe with leg pain

## 2017-01-02 ENCOUNTER — Encounter (HOSPITAL_COMMUNITY): Payer: Self-pay

## 2017-01-02 ENCOUNTER — Encounter: Payer: Medicare Other | Admitting: *Deleted

## 2017-01-02 ENCOUNTER — Observation Stay (HOSPITAL_COMMUNITY): Payer: Medicare Other

## 2017-01-02 DIAGNOSIS — Z7952 Long term (current) use of systemic steroids: Secondary | ICD-10-CM | POA: Diagnosis not present

## 2017-01-02 DIAGNOSIS — F419 Anxiety disorder, unspecified: Secondary | ICD-10-CM | POA: Diagnosis present

## 2017-01-02 DIAGNOSIS — M25572 Pain in left ankle and joints of left foot: Secondary | ICD-10-CM | POA: Diagnosis present

## 2017-01-02 DIAGNOSIS — M4856XA Collapsed vertebra, not elsewhere classified, lumbar region, initial encounter for fracture: Secondary | ICD-10-CM | POA: Diagnosis not present

## 2017-01-02 DIAGNOSIS — I480 Paroxysmal atrial fibrillation: Secondary | ICD-10-CM

## 2017-01-02 DIAGNOSIS — R0902 Hypoxemia: Secondary | ICD-10-CM | POA: Diagnosis not present

## 2017-01-02 DIAGNOSIS — Z87891 Personal history of nicotine dependence: Secondary | ICD-10-CM | POA: Diagnosis not present

## 2017-01-02 DIAGNOSIS — M419 Scoliosis, unspecified: Secondary | ICD-10-CM | POA: Diagnosis present

## 2017-01-02 DIAGNOSIS — M353 Polymyalgia rheumatica: Secondary | ICD-10-CM

## 2017-01-02 DIAGNOSIS — I110 Hypertensive heart disease with heart failure: Secondary | ICD-10-CM | POA: Diagnosis present

## 2017-01-02 DIAGNOSIS — M4854XA Collapsed vertebra, not elsewhere classified, thoracic region, initial encounter for fracture: Secondary | ICD-10-CM | POA: Diagnosis not present

## 2017-01-02 DIAGNOSIS — R7989 Other specified abnormal findings of blood chemistry: Secondary | ICD-10-CM | POA: Diagnosis not present

## 2017-01-02 DIAGNOSIS — I5043 Acute on chronic combined systolic (congestive) and diastolic (congestive) heart failure: Secondary | ICD-10-CM | POA: Diagnosis present

## 2017-01-02 DIAGNOSIS — S22070A Wedge compression fracture of T9-T10 vertebra, initial encounter for closed fracture: Secondary | ICD-10-CM | POA: Diagnosis not present

## 2017-01-02 DIAGNOSIS — I495 Sick sinus syndrome: Secondary | ICD-10-CM | POA: Diagnosis present

## 2017-01-02 DIAGNOSIS — I442 Atrioventricular block, complete: Secondary | ICD-10-CM | POA: Diagnosis present

## 2017-01-02 DIAGNOSIS — Z7901 Long term (current) use of anticoagulants: Secondary | ICD-10-CM | POA: Diagnosis not present

## 2017-01-02 DIAGNOSIS — W228XXA Striking against or struck by other objects, initial encounter: Secondary | ICD-10-CM | POA: Diagnosis present

## 2017-01-02 DIAGNOSIS — Z7951 Long term (current) use of inhaled steroids: Secondary | ICD-10-CM | POA: Diagnosis not present

## 2017-01-02 DIAGNOSIS — I5033 Acute on chronic diastolic (congestive) heart failure: Secondary | ICD-10-CM | POA: Diagnosis not present

## 2017-01-02 DIAGNOSIS — F329 Major depressive disorder, single episode, unspecified: Secondary | ICD-10-CM | POA: Diagnosis present

## 2017-01-02 DIAGNOSIS — M8580 Other specified disorders of bone density and structure, unspecified site: Secondary | ICD-10-CM | POA: Diagnosis present

## 2017-01-02 DIAGNOSIS — S99922A Unspecified injury of left foot, initial encounter: Secondary | ICD-10-CM | POA: Diagnosis present

## 2017-01-02 DIAGNOSIS — J449 Chronic obstructive pulmonary disease, unspecified: Secondary | ICD-10-CM | POA: Diagnosis present

## 2017-01-02 DIAGNOSIS — Z79899 Other long term (current) drug therapy: Secondary | ICD-10-CM | POA: Diagnosis not present

## 2017-01-02 DIAGNOSIS — M4854XD Collapsed vertebra, not elsewhere classified, thoracic region, subsequent encounter for fracture with routine healing: Secondary | ICD-10-CM

## 2017-01-02 DIAGNOSIS — M546 Pain in thoracic spine: Secondary | ICD-10-CM | POA: Diagnosis not present

## 2017-01-02 DIAGNOSIS — Z7983 Long term (current) use of bisphosphonates: Secondary | ICD-10-CM | POA: Diagnosis not present

## 2017-01-02 DIAGNOSIS — J9601 Acute respiratory failure with hypoxia: Secondary | ICD-10-CM | POA: Diagnosis not present

## 2017-01-02 DIAGNOSIS — Z950 Presence of cardiac pacemaker: Secondary | ICD-10-CM | POA: Diagnosis not present

## 2017-01-02 LAB — CBC
HEMATOCRIT: 38 % (ref 36.0–46.0)
Hemoglobin: 11.9 g/dL — ABNORMAL LOW (ref 12.0–15.0)
MCH: 28.3 pg (ref 26.0–34.0)
MCHC: 31.3 g/dL (ref 30.0–36.0)
MCV: 90.3 fL (ref 78.0–100.0)
Platelets: 266 10*3/uL (ref 150–400)
RBC: 4.21 MIL/uL (ref 3.87–5.11)
RDW: 15.4 % (ref 11.5–15.5)
WBC: 7.3 10*3/uL (ref 4.0–10.5)

## 2017-01-02 LAB — PROTIME-INR
INR: 1.25
PROTHROMBIN TIME: 15.8 s — AB (ref 11.4–15.2)

## 2017-01-02 LAB — BASIC METABOLIC PANEL
ANION GAP: 8 (ref 5–15)
BUN: 13 mg/dL (ref 6–20)
CALCIUM: 8.3 mg/dL — AB (ref 8.9–10.3)
CO2: 29 mmol/L (ref 22–32)
Chloride: 101 mmol/L (ref 101–111)
Creatinine, Ser: 0.73 mg/dL (ref 0.44–1.00)
GFR calc Af Amer: >60 mL/min (ref >60)
GFR calc non Af Amer: >60 mL/min (ref >60)
GLUCOSE: 90 mg/dL (ref 65–99)
Potassium: 3.8 mmol/L (ref 3.5–5.1)
Sodium: 138 mmol/L (ref 135–145)

## 2017-01-02 LAB — APTT: APTT: 43 s — AB (ref 24–36)

## 2017-01-02 MED ORDER — IOPAMIDOL (ISOVUE-370) INJECTION 76%
INTRAVENOUS | Status: AC
  Administered 2017-01-02: 100 mL via INTRAVENOUS
  Filled 2017-01-02: qty 100

## 2017-01-02 MED ORDER — FUROSEMIDE 10 MG/ML IJ SOLN
40.0000 mg | Freq: Once | INTRAMUSCULAR | Status: AC
  Administered 2017-01-02: 40 mg via INTRAVENOUS
  Filled 2017-01-02: qty 4

## 2017-01-02 MED ORDER — BENZOCAINE 10 % MT GEL
Freq: Three times a day (TID) | OROMUCOSAL | Status: DC | PRN
  Filled 2017-01-02: qty 9.4

## 2017-01-02 MED ORDER — IOPAMIDOL (ISOVUE-370) INJECTION 76%
100.0000 mL | Freq: Once | INTRAVENOUS | Status: AC | PRN
  Administered 2017-01-02: 100 mL via INTRAVENOUS

## 2017-01-02 NOTE — Progress Notes (Signed)
PROGRESS NOTE    [name]  AYT:[id number] DOB: 02-24-1931 DOA: 01/01/2017 PCP: Lajean Manes, MD  Brief Narrative:[name] is a 83 y.o. female with medical history significant of A Fib on Eliquis, complete heart block s/p pacemaker, CHF, COPD, polymyalgia rheumatica on chronic steroids, HTN who presents with few week history of left leg pain and low back pain., She reportedly she stubbed her left great toe and since then, she has had significant left ankle pain that has limited her mobility, and now is using a walker.  She also admits to back pain, In ED: Left ankle and left foot x-ray was completed without acute abnormality, it did show mild-to-moderate diffuse lateral soft tissue swelling. CT chest without contrast revealed improving pneumonia (she had been previously admitted at the hospital in April for pneumonia) , as well as interval compression fracture of T9 vertebral body with 25%, anterior height loss.    Assessment & Plan:  Subacute Low back pain/ T9 vertebral compression fracture new from imaging in April -for 1-2 weeks now, denies any trauma -Supportive care with Toradol -Physical therapy evaluation -Discussed with patient and daughter they would like to be evaluated for possible kyphoplasty  -will request IR consult, I will hold Eliquis dose pending IR eval  Acute on chronic systolic CHF with hypoxia -Not symptomatic  -has COPD at baseline -CTA chest with scarring from prior pneumonia and small pleural effusions, will give IV lasix now -last ECHO with EF 45-50%  Left ankle pain -Xray without bony abnormality. Showed soft tissue swelling. Could possibly be due to ankle sprain? Cannot perform MRI due to pacemaker  -Supportive care  -PT eval  PMR -Continue chronic prednisone dose   Depression/Anxiety -Continue home lexapro, xanax, remeron   HTN -Continue toprol   Paroxysmal A Fib -chadsvasc >3 -Continue eliquis , toprol  Complete heart  block s/p pacemaker  -Continue cardiac monitoring   COPD  -Continue Dulera   DVT prophylaxis: On Eliquis Code Status: Full  Family Communication: daughter at bedside Disposition Plan: Pending improvement, PT OT evaluation, possible kyphoplasty, Rx of CHF   Consultants:   IR   Subjective: Has severe back pain, breathing improving  Objective: Vitals:   01/01/17 2048 01/01/17 2049 01/02/17 0637 01/02/17 1011  BP:  (!) 165/75 (!) 162/66 (!) 145/68  Pulse:  72 76 75  Resp:  18 18   Temp:  99.1 F (37.3 C) 98.8 F (37.1 C)   TempSrc:  Oral Oral   SpO2: 97% 97% 100%   Weight:      Height:        Intake/Output Summary (Last 24 hours) at 01/02/17 1137 Last data filed at 01/02/17 1017  Gross per 24 hour  Intake            812.5 ml  Output             2100 ml  Net          -1287.5 ml   Filed Weights   01/01/17 1036 01/01/17 1725  Weight: 59 kg (130 lb) 64.2 kg (141 lb 8.6 oz)    Examination:  General exam: Appears calm but uncomfortable Respiratory system: Clear to auscultation Cardiovascular system: S1 & S2 heard, RRR. No JVD, murmurs Gastrointestinal system: Abdomen is nondistended, soft and nontender. Normal bowel sounds heard. Central nervous system: Alert and oriented. No focal neurological deficits. BAck : mild point tenderness over lower thoracic spine radiating to L axillary line Extremities: Symmetric 5 x 5 power.  Skin: No rashes, lesions or ulcers Psychiatry: Judgement and insight appear normal. Mood & affect appropriate.     Data Reviewed:   CBC:  Recent Labs Lab 01/01/17 1155 01/02/17 0545  WBC 9.6 7.3  NEUTROABS 6.9  --   HGB 13.0 11.9*  HCT 40.6 38.0  MCV 90.8 90.3  PLT 292 865   Basic Metabolic Panel:  Recent Labs Lab 01/01/17 1155 01/02/17 0545  NA 143 138  K 3.6 3.8  CL 105 101  CO2 29 29  GLUCOSE 89 90  BUN 18 13  CREATININE 0.78 0.73  CALCIUM 8.7* 8.3*   GFR: Estimated Creatinine Clearance: 47.9 mL/min (by C-G  formula based on SCr of 0.73 mg/dL). Liver Function Tests:  Recent Labs Lab 01/01/17 1155  AST 22  ALT 15  ALKPHOS 84  BILITOT 1.3*  PROT 6.9  ALBUMIN 3.6   No results for input(s): LIPASE, AMYLASE in the last 168 hours. No results for input(s): AMMONIA in the last 168 hours. Coagulation Profile: No results for input(s): INR, PROTIME in the last 168 hours. Cardiac Enzymes: No results for input(s): CKTOTAL, CKMB, CKMBINDEX, TROPONINI in the last 168 hours. BNP (last 3 results) No results for input(s): PROBNP in the last 8760 hours. HbA1C: No results for input(s): HGBA1C in the last 72 hours. CBG: No results for input(s): GLUCAP in the last 168 hours. Lipid Profile: No results for input(s): CHOL, HDL, LDLCALC, TRIG, CHOLHDL, LDLDIRECT in the last 72 hours. Thyroid Function Tests: No results for input(s): TSH, T4TOTAL, FREET4, T3FREE, THYROIDAB in the last 72 hours. Anemia Panel: No results for input(s): VITAMINB12, FOLATE, FERRITIN, TIBC, IRON, RETICCTPCT in the last 72 hours. Urine analysis:    Component Value Date/Time   COLORURINE YELLOW 10/11/2016 0033   APPEARANCEUR CLEAR 10/11/2016 0033   LABSPEC 1.016 10/11/2016 0033   PHURINE 6.0 10/11/2016 0033   GLUCOSEU NEGATIVE 10/11/2016 0033   HGBUR SMALL (A) 10/11/2016 0033   BILIRUBINUR NEGATIVE 10/11/2016 0033   KETONESUR NEGATIVE 10/11/2016 0033   PROTEINUR NEGATIVE 10/11/2016 0033   UROBILINOGEN 1.0 01/19/2011 1030   NITRITE NEGATIVE 10/11/2016 0033   LEUKOCYTESUR NEGATIVE 10/11/2016 0033   Sepsis Labs: @LABRCNTIP (procalcitonin:4,lacticidven:4)  )No results found for this or any previous visit (from the past 240 hour(s)).       Radiology Studies: Dg Ankle Complete Left  Result Date: 01/01/2017 CLINICAL DATA:  Left ankle pain since hitting her foot on something 3 weeks ago. EXAM: LEFT ANKLE COMPLETE - 3+ VIEW COMPARISON:  None. FINDINGS: Mild-to-moderate diffuse lateral soft tissue swelling. Mild talofibular  spur formation. No fracture, dislocation or effusion seen. IMPRESSION: No fracture. Electronically Signed   By: Claudie Revering M.D.   On: 01/01/2017 11:46   Ct Chest Wo Contrast  Result Date: 01/01/2017 CLINICAL DATA:  Severe LEFT leg pain, low back and upper back pain, progressive pain over 3 days, cough and congestion, history pneumonia, benign essential hypertension, COPD, dermatomyositis, atrial fibrillation, CHF, polymyalgia rheumatica, former smoker EXAM: CT CHEST WITHOUT CONTRAST TECHNIQUE: Multidetector CT imaging of the chest was performed following the standard protocol without IV contrast. Sagittal and coronal MPR images reconstructed from axial data set. Respiratory motion artifacts degrade exam. COMPARISON:  CT chest 10/05/2016 FINDINGS: Cardiovascular: Atherosclerotic calcifications aorta, proximal great vessels and coronary arteries. Cardiac chambers appear mildly dilated. No pericardial effusion. Sequential pacemaker leads via RIGHT subclavian approach at RIGHT ventricle and RIGHT atrium. Mediastinum/Nodes: Scattered normal sized mediastinal lymph nodes. No thoracic adenopathy. Esophagus unremarkable. Base of cervical region normal appearance  Lungs/Pleura: Small BILATERAL pleural effusions. Hyperinflated lungs with scattered peribronchial thickening and diffuse interseptal thickening unchanged. Subsegmental atelectasis in BILATERAL lower lobes, lingula, and RIGHT middle lobe. Calcified granuloma RIGHT middle lobe. Scattered tiny nodules in periphery of RIGHT lung, some stable, additional potentially obscured by infiltrates on previous exam. No definite enlarging pulmonary mass or nodule. No segmental consolidation or pneumothorax. Upper Abdomen: Multiple hepatic cysts, better characterized on previous exam which last motion artifacts, grossly stable in appearance. Probable peripelvic cyst upper pole LEFT kidney. Remaining visualized upper abdomen unremarkable. Musculoskeletal: Sclerosis at lateral  RIGHT third fourth fifth sixth and seventh ribs from healing fractures. Diffuse osseous demineralization. Interval acute compression fracture of T9 vertebral body with 25% anterior height loss. IMPRESSION: Improved pulmonary infiltrates particularly in RIGHT lower lobe and RIGHT middle lobe since previous exam. Scattered subsegmental atelectasis and bronchitic changes with small BILATERAL pleural effusions. Hepatic cysts. Osseous demineralization with interval compression fracture of T9 vertebral body with approximately 25% anterior height loss. Tiny peripheral nonspecific nodular foci RIGHT lung, recommendation below. No follow-up needed if patient is low-risk (and has no known or suspected primary neoplasm). Non-contrast chest CT can be considered in 12 months if patient is high-risk. This recommendation follows the consensus statement: Guidelines for Management of Incidental Pulmonary Nodules Detected on CT Images: From the Fleischner Society 2017; Radiology 2017; 284:228-243. Aortic Atherosclerosis (ICD10-I70.0) and Emphysema (ICD10-J43.9). Electronically Signed   By: Lavonia Dana M.D.   On: 01/01/2017 12:12   Ct Angio Chest Pe W Or Wo Contrast  Result Date: 01/02/2017 CLINICAL DATA:  83 year old female with shortness of breath and cough. Elevated D-dimer. EXAM: CT ANGIOGRAPHY CHEST WITH CONTRAST TECHNIQUE: Multidetector CT imaging of the chest was performed using the standard protocol during bolus administration of intravenous contrast. Multiplanar CT image reconstructions and MIPs were obtained to evaluate the vascular anatomy. CONTRAST:  100 cc Isovue 370 COMPARISON:  Chest CT dated 01/01/2017 FINDINGS: Cardiovascular: There is stable moderate cardiomegaly. Multi vessel coronary vascular calcification primarily involving the LAD, left circumflex artery noted. There is no pericardial effusion. Right pectoral pacemaker device noted. There is moderate atherosclerotic calcification of the thoracic aorta. The  aorta is otherwise unremarkable for the degree of enhancement. There is no CT evidence of pulmonary embolism. Mediastinum/Nodes: No hilar adenopathy. Stable appearing subcarinal lymph node measuring 8 mm in short axis. Small AP window lymph nodes appear similar to prior CT. Esophagus is grossly unremarkable. The thyroid gland is heterogeneous. Lungs/Pleura: There small bilateral pleural effusions, slightly increased compared to the prior CT. There is associated subsegmental atelectasis of the lung bases. Bibasilar linear scarring noted. There is mild diffuse interlobular septal prominence most consistent with edema. Stable focal area of consolidative change in the lingula likely represents atelectasis/scarring. Stable right upper lobe linear atelectasis/ scarring noted. There is no pneumothorax. The central airways are patent. Upper Abdomen: Multiple scattered hepatic hypodense lesions, incompletely characterized but demonstrate fluid attenuation most consistent with cysts. There is passive flow of contrast from the right atrium into the IVC consistent with a degree of right cardiac dysfunction. Musculoskeletal: Osteopenia with degenerative changes of the spine. Old healed right rib fractures. Stable T9 compression fracture. No acute fracture. Review of the MIP images confirms the above findings. IMPRESSION: 1. No CT evidence of pulmonary embolism. 2. Cardiomegaly with mild interstitial edema. 3. Small bilateral pleural effusions, similar or slightly increased compared to prior CT. 4. Bibasilar subsegmental atelectasis versus infiltrate. Linear bibasilar atelectasis/scarring. Focal consolidative change in the lingula appears similar to the prior  CT. 5.  Aortic Atherosclerosis (ICD10-I70.0). Electronically Signed   By: Anner Crete M.D.   On: 01/02/2017 04:48   Ct Lumbar Spine Wo Contrast  Result Date: 01/01/2017 CLINICAL DATA:  Severe LEFT leg pain, and low back pain. Chronic but acutely progressive. EXAM:  CT LUMBAR SPINE WITHOUT CONTRAST TECHNIQUE: Multidetector CT imaging of the lumbar spine was performed without intravenous contrast administration. Multiplanar CT image reconstructions were also generated. COMPARISON:  CT lumbar spine 05/24/2005. FINDINGS: Segmentation: Standard Alignment: Normal. Vertebrae: No acute fracture or focal pathologic process. Incompletely visualized Tarlov cyst at S2 appears to result in bony remodeling. Paraspinal and other soft tissues: Aortic atherosclerosis. No masses. Disc levels: L1-L2: Fairly good preservation of disc height. Osseous spurring. No definite impingement. L2-L3: Fairly good preservation of disc height. Osseous spurring facet arthropathy. Broad-based disc protrusion. LEFT subarticular zone and foraminal zone narrowing. LEFT L2 and LEFT L3 nerve root impingement. Mild stenosis. L3-L4: Disc space narrowing, worse on the LEFT. Osseous spurring. Broad-based disc protrusion central and to the LEFT, partially calcified. Facet arthropathy. Moderate to severe stenosis. BILATERAL LEFT greater than RIGHT L3 and L4 nerve root impingement. L4-L5: Disc space narrowing, worse on the RIGHT. Osseous spurring. Facet arthropathy. Central disc protrusion, partially calcified, eccentric to the RIGHT. Severe stenosis. RIGHT greater than LEFT L4 and L5 nerve root impingement. L5-S1: Disc space narrowing. Central protrusion. Posterior element hypertrophy. Mild stenosis. RIGHT greater than LEFT L5 and S1 nerve root impingement. Compared with study 12 years ago, there is progression of disc disease at all levels. IMPRESSION: Advanced multilevel spondylosis. Potentially symptomatic neural impingement at multiple levels; LEFT-sided predominant at L2-3 and L3-4; see comments above. No acute compression fracture or traumatic subluxation. Electronically Signed   By: Staci Righter M.D.   On: 01/01/2017 12:10   Dg Foot Complete Left  Result Date: 01/01/2017 CLINICAL DATA:  Left foot pain since  hitting her foot on something 3 weeks ago. EXAM: LEFT FOOT - COMPLETE 3+ VIEW COMPARISON:  Left ankle radiographs obtained at the same time. FINDINGS: Hallux valgus. Calcifications medial to the first metatarsal head and in the plantar soft tissues of the proximal foot. No fracture or dislocation seen. Minimal inferior calcaneal spur formation. IMPRESSION: No fracture. Electronically Signed   By: Claudie Revering M.D.   On: 01/01/2017 11:48        Scheduled Meds: . ALPRAZolam  0.25 mg Oral QHS  . escitalopram  10 mg Oral Daily  . metoprolol succinate  25 mg Oral Daily  . mirtazapine  7.5 mg Oral QHS  . mometasone-formoterol  2 puff Inhalation BID  . predniSONE  10 mg Oral Daily  . sodium chloride flush  3 mL Intravenous Q12H   Continuous Infusions:   LOS: 0 days    Time spent: 3min    Domenic Polite, MD [facility] Pager [phone]  If 7PM-7AM, please contact night-coverage [email or web address] Password [facility] 01/02/2017, 11:37 AM

## 2017-01-02 NOTE — Evaluation (Signed)
Occupational Therapy Evaluation Patient Details Name: [name] MRN: [id number] DOB: 07-Jan-1931 Today's Date: 01/02/2017    History of Present Illness [name] is a 83 y.o. female with medical history significant of A Fib on Eliquis, complete heart block s/p pacemaker, CHF, COPD, polymyalgia rheumatica on chronic steroids, HTN who presents with few week history of left leg pain, T9 compression fx   Clinical Impression   Pt admitted with leg and back pain. Pt currently with functional limitations due to the deficits listed below (see OT Problem List).  Pt will benefit from skilled OT to increase their safety and independence with ADL and functional mobility for ADL to facilitate discharge to venue listed below.      Follow Up Recommendations  SNF    Equipment Recommendations  None recommended by OT    Recommendations for Other Services       Precautions / Restrictions Precautions Precautions: Fall      Mobility Bed Mobility Overal bed mobility: Needs Assistance Bed Mobility: Rolling;Sidelying to Sit;Sit to Supine Rolling: Mod assist Sidelying to sit: Mod assist   Sit to supine: Mod assist;+2 for physical assistance;+2 for safety/equipment   General bed mobility comments: assist with LEs and trunk, extra time needed, heavy use of rail, cues for back precautions d/t compression fx/pain--pt did not adhere to precautions  Transfers Overall transfer level: Needs assistance Equipment used: Rolling walker (2 wheeled) Transfers: Sit to/from Stand Sit to Stand: Mod assist         General transfer comment: extra time, cues for hand placement and control of descent    Balance Overall balance assessment: Needs assistance Sitting-balance support: Feet supported;No upper extremity supported Sitting balance-Leahy Scale: Fair Sitting balance - Comments: unable to wt shift, unable to don slipper socks     Standing balance-Leahy Scale: Poor Standing balance comment:  heavily reliant on UEs and support form therapist (poor to zero)             High level balance activites: Side stepping High Level Balance Comments: with mod assist           ADL either performed or assessed with clinical judgement   ADL Overall ADL's : Needs assistance/impaired Eating/Feeding: Set up;Sitting   Grooming: Set up;Sitting   Upper Body Bathing: Minimal assistance;Sitting   Lower Body Bathing: Moderate assistance;Sit to/from stand;Cueing for safety;Cueing for sequencing   Upper Body Dressing : Set up;Sitting   Lower Body Dressing: Moderate assistance   Toilet Transfer: Moderate assistance;RW Toilet Transfer Details (indicate cue type and reason): sit to stand only Toileting- Water quality scientist and Hygiene: Total assistance;Sit to/from stand;Cueing for safety;Cueing for sequencing         General ADL Comments: pain limiting pt.  Discussion with pt and daugther regarding Dc plan. May need SNF     Vision Patient Visual Report: No change from baseline              Pertinent Vitals/Pain Pain Assessment: 0-10 Pain Score: 3  Pain Location: LLE Pain Descriptors / Indicators: Discomfort;Sore Pain Intervention(s): Limited activity within patient's tolerance;Monitored during session;Repositioned        Extremity/Trunk Assessment Upper Extremity Assessment Upper Extremity Assessment: Overall WFL for tasks assessed   Lower Extremity Assessment Lower Extremity Assessment: Generalized weakness       Communication Communication Communication: No difficulties   Cognition Arousal/Alertness: Awake/alert Behavior During Therapy: WFL for tasks assessed/performed Overall Cognitive Status: Within Functional Limits for tasks assessed  Home Living Family/patient expects to be discharged to:: Private residence Living Arrangements: Alone Available Help at Discharge: Family Type of Home:  House Home Access: Stairs to enter     Home Layout: One level               Home Equipment: Environmental consultant - 2 wheels;Cane - single point;Bedside commode   Additional Comments: daughter lives nearby      Prior Functioning/Environment Level of Independence: Independent;Independent with assistive device(s)        Comments: using walker the last 2 weeks prior to adm        OT Problem List: Decreased strength;Decreased activity tolerance;Decreased knowledge of use of DME or AE;Impaired balance (sitting and/or standing);Decreased safety awareness;Pain      OT Treatment/Interventions: Self-care/ADL training;Patient/family education    OT Goals(Current goals can be found in the care plan section) Acute Rehab OT Goals Patient Stated Goal: to get better, go back home OT Goal Formulation: With patient Time For Goal Achievement: 01/16/17 Potential to Achieve Goals: Good  OT Frequency: Min 2X/week   Barriers to D/C: Decreased caregiver support          Co-evaluation              AM-PAC PT "6 Clicks" Daily Activity     Outcome Measure Help from another person eating meals?: None Help from another person taking care of personal grooming?: A Little Help from another person toileting, which includes using toliet, bedpan, or urinal?: A Lot Help from another person bathing (including washing, rinsing, drying)?: A Lot Help from another person to put on and taking off regular upper body clothing?: A Little Help from another person to put on and taking off regular lower body clothing?: A Lot 6 Click Score: 16   End of Session Equipment Utilized During Treatment: Gait belt Nurse Communication: Mobility status  Activity Tolerance: Patient tolerated treatment well Patient left: in bed;with call bell/phone within reach  OT Visit Diagnosis: Unsteadiness on feet (R26.81);Muscle weakness (generalized) (M62.81);Pain;History of falling (Z91.81)                Time: 1100-1118 OT Time  Calculation (min): 18 min Charges:  OT General Charges $OT Visit: 1 Procedure OT Evaluation $OT Eval Moderate Complexity: 1 Procedure G-Codes: OT G-codes **NOT FOR INPATIENT CLASS** Functional Assessment Tool Used: Clinical judgement Functional Limitation: Self care Self Care Current Status (I0165): At least 60 percent but less than 80 percent impaired, limited or restricted Self Care Goal Status (V3748): At least 1 percent but less than 20 percent impaired, limited or restricted   Kari Baars, [place]  Payton Mccallum D 01/02/2017, 12:26 PM

## 2017-01-02 NOTE — Consult Note (Signed)
Chief Complaint: Patient was seen in consultation today for Thoracic 9 vertebroplasty/kyphoplasty Chief Complaint  Patient presents with  . Generalized Body Aches   at the request of Dr Fanny Bien  Referring Physician(s): Dr Fanny Bien  Supervising Physician: Jacqulynn Cadet  Patient Status: [facility] - In-pt  History of Present Illness: [name] is a 83 y.o. female   Pt does not recall specific injury Has had worsening left leg and back pain x 5-8 days Hx polymyalgia rheumatica- chronic steroids Hx Afib-- on Eliquis--LD 7/15 pm +pacemaker---could not have MRI Pt states pain meds are without relief Cannot get into comfortable position Ranks pain as "9 or 10" on scale of 10   CT today: Musculoskeletal: Osteopenia with degenerative changes of the spine. Old healed right rib fractures. Stable T9 compression fracture.  Request made for T9 VP/KP Dr Pascal Lux has reviewed imaging and approves procedure Continue to hold Eliquis--(can perform procedure as early as Wed safely) Insurance is pending authorization    Past Medical History:  Diagnosis Date  . CHB (complete heart block) ([place]) 07/13/2013   Pacemaker dependent  . CHF (congestive heart failure) ([place])   . COPD (chronic obstructive pulmonary disease) ([place])   . DM (dermatomyositis)   . Orthostatic hypotension   . Pacemaker 07/13/2013   Her original pacemaker and the current leads were implanted in 1992. She has had 2 generator change out, most recently in 2010. Her device is a Buyer, retail 2110 non RF dual-chamber pacemaker with a battery longevity estimated at about 7 years. The atrial lead is a St. Jude [id number] and the ventricular lead was a Biotronik [id number].   [name] Paroxysmal atrial fibrillation ([place]) 09/04/2014  . Polymyalgia rheumatica ([place])   . Scoliosis   . SSS (sick sinus syndrome) ([place]) 07/13/2013  . Syncope   . Systemic hypertension   . Thoracic kyphosis     Past Surgical History:  Procedure Laterality Date    . NM MYOCAR PERF WALL MOTION  09/13/2011   Low risk  . PERMANENT PACEMAKER GENERATOR CHANGE  03/27/2009   [employer].Jude  . US ECHOCARDIOGRAPHY  03/28/2012   Mod LAE,mild MR,aortic sclerosis w/mod AI,mod. TR,mild PI,Stage I diastolic dysfunction  . VIDEO BRONCHOSCOPY Bilateral 10/07/2016   Procedure: VIDEO BRONCHOSCOPY WITH FLUORO;  Surgeon: Marshell Garfinkel, MD;  Location: [facility] ENDOSCOPY;  Service: Cardiopulmonary;  Laterality: Bilateral;    Allergies: Patient has no known allergies.  Medications: Prior to Admission medications   Medication Sig Start Date End Date Taking? Authorizing Provider  alendronate (FOSAMAX) 70 MG tablet Take 70 mg by mouth every Saturday. Take with a full glass of water on an empty stomach.   Yes [provider]  ALPRAZolam (XANAX) 0.25 MG tablet Take 0.25 mg by mouth at bedtime.    Yes [provider]  budesonide-formoterol (SYMBICORT) 160-4.5 MCG/ACT inhaler Inhale 2 puffs into the lungs 2 (two) times daily. 11/09/16 01/01/18 Yes Mannam, Praveen, MD  ELIQUIS 2.5 MG TABS tablet TAKE 1 TABLET(2.5 MG) BY MOUTH TWICE DAILY 12/23/16  Yes Croitoru, Mihai, MD  escitalopram (LEXAPRO) 10 MG tablet Take 10 mg by mouth daily.   Yes [provider]  furosemide (LASIX) 40 MG tablet Take 40 mg by mouth daily as needed (for weight gain of greater than 2lbs).   Yes [provider]  metoprolol succinate (TOPROL-XL) 25 MG 24 hr tablet Take 25 mg by mouth daily.    Yes [provider]  mirtazapine (REMERON) 15 MG tablet Take 7.5 mg  by mouth at bedtime.    Yes [provider]  potassium chloride SA (K-DUR,KLOR-CON) 20 MEQ tablet Take 20 mEq by mouth daily as needed (when taking Lasix).    Yes [provider]  predniSONE (DELTASONE) 10 MG tablet Take 10 mg by mouth daily.    Yes [provider]  Ipratropium-Albuterol (COMBIVENT RESPIMAT) 20-100 MCG/ACT AERS respimat Inhale 1 puff into the lungs every 6 (six) hours as needed for  wheezing or shortness of breath. 08/29/15   Barton Dubois, MD  midodrine (PROAMATINE) 2.5 MG tablet Take 2.5 mg by mouth as needed (when systolic BP is lower than 100).     [provider]     Family History  Problem Relation Age of Onset  . Stroke Mother     Social History   Social History  . Marital status: Married    Spouse name: N/A  . Number of children: N/A  . Years of education: N/A   Social History Main Topics  . Smoking status: Former Smoker    Packs/day: 0.50    Years: 60.00    Types: Cigarettes  . Smokeless tobacco: Never Used     Comment: quit 10/10/16--11/09/16  . Alcohol use No  . Drug use: No  . Sexual activity: Not Asked   Other Topics Concern  . None   Social History Narrative  . None    Review of Systems: A 12 point ROS discussed and pertinent positives are indicated in the HPI above.  All other systems are negative.  Review of Systems  Constitutional: Positive for activity change. Negative for appetite change, fatigue and fever.  Respiratory: Negative for cough and shortness of breath.   Cardiovascular: Negative for chest pain.  Gastrointestinal: Negative for abdominal pain.  Musculoskeletal: Positive for back pain and gait problem.  Neurological: Positive for weakness.  Psychiatric/Behavioral: Negative for behavioral problems and confusion.    Vital Signs: BP (!) 145/68   Pulse 75   Temp 98.8 F (37.1 C) (Oral)   Resp 18   Ht 5\' 5"  (1.651 m)   Wt 141 lb 8.6 oz (64.2 kg)   SpO2 100%   BMI 23.55 kg/m   Physical Exam  Constitutional: She is oriented to person, place, and time.  Cardiovascular:  No murmur heard. Irregular  Pulmonary/Chest: Effort normal and breath sounds normal.  Abdominal: Soft. Bowel sounds are normal.  Musculoskeletal: Normal range of motion.  Mid back pain No sign of trauma  Neurological: She is alert and oriented to person, place, and time.  Skin: Skin is warm and dry.  Psychiatric: She has a normal  mood and affect. Her behavior is normal. Judgment and thought content normal.  Nursing note and vitals reviewed.   Mallampati Score:  MD Evaluation Airway: WNL Heart: WNL Abdomen: WNL Chest/ Lungs: WNL ASA  Classification: 3 Mallampati/Airway Score: One  Imaging: Dg Knee 1-2 Views Left  Result Date: 12/28/2016 CLINICAL DATA:  Pt here c/o left hip AND knee pain x 1 week. Left hip hurts when getting up. Left knee hurts all over in different areas at different time that radiates to ankle. No hx of injury. EXAM: LEFT KNEE - 1-2 VIEW COMPARISON:  None. FINDINGS: No fracture or bone lesion. Mild to moderate medial joint space compartment narrowing. There are small marginal osteophytes from the medial and lateral compartments. Chondrocalcinosis is noted along the menisci. Small joint effusion.  Soft tissues otherwise unremarkable. Bones are demineralized. IMPRESSION: 1. No fracture or bone lesion. 2. Osteoarthritis  with a small joint effusion. Electronically Signed   By: Lajean Manes M.D.   On: 12/28/2016 11:05   Dg Ankle Complete Left  Result Date: 01/01/2017 CLINICAL DATA:  Left ankle pain since hitting her foot on something 3 weeks ago. EXAM: LEFT ANKLE COMPLETE - 3+ VIEW COMPARISON:  None. FINDINGS: Mild-to-moderate diffuse lateral soft tissue swelling. Mild talofibular spur formation. No fracture, dislocation or effusion seen. IMPRESSION: No fracture. Electronically Signed   By: Claudie Revering M.D.   On: 01/01/2017 11:46   Ct Chest Wo Contrast  Result Date: 01/01/2017 CLINICAL DATA:  Severe LEFT leg pain, low back and upper back pain, progressive pain over 3 days, cough and congestion, history pneumonia, benign essential hypertension, COPD, dermatomyositis, atrial fibrillation, CHF, polymyalgia rheumatica, former smoker EXAM: CT CHEST WITHOUT CONTRAST TECHNIQUE: Multidetector CT imaging of the chest was performed following the standard protocol without IV contrast. Sagittal and coronal MPR  images reconstructed from axial data set. Respiratory motion artifacts degrade exam. COMPARISON:  CT chest 10/05/2016 FINDINGS: Cardiovascular: Atherosclerotic calcifications aorta, proximal great vessels and coronary arteries. Cardiac chambers appear mildly dilated. No pericardial effusion. Sequential pacemaker leads via RIGHT subclavian approach at RIGHT ventricle and RIGHT atrium. Mediastinum/Nodes: Scattered normal sized mediastinal lymph nodes. No thoracic adenopathy. Esophagus unremarkable. Base of cervical region normal appearance Lungs/Pleura: Small BILATERAL pleural effusions. Hyperinflated lungs with scattered peribronchial thickening and diffuse interseptal thickening unchanged. Subsegmental atelectasis in BILATERAL lower lobes, lingula, and RIGHT middle lobe. Calcified granuloma RIGHT middle lobe. Scattered tiny nodules in periphery of RIGHT lung, some stable, additional potentially obscured by infiltrates on previous exam. No definite enlarging pulmonary mass or nodule. No segmental consolidation or pneumothorax. Upper Abdomen: Multiple hepatic cysts, better characterized on previous exam which last motion artifacts, grossly stable in appearance. Probable peripelvic cyst upper pole LEFT kidney. Remaining visualized upper abdomen unremarkable. Musculoskeletal: Sclerosis at lateral RIGHT third fourth fifth sixth and seventh ribs from healing fractures. Diffuse osseous demineralization. Interval acute compression fracture of T9 vertebral body with 25% anterior height loss. IMPRESSION: Improved pulmonary infiltrates particularly in RIGHT lower lobe and RIGHT middle lobe since previous exam. Scattered subsegmental atelectasis and bronchitic changes with small BILATERAL pleural effusions. Hepatic cysts. Osseous demineralization with interval compression fracture of T9 vertebral body with approximately 25% anterior height loss. Tiny peripheral nonspecific nodular foci RIGHT lung, recommendation below. No  follow-up needed if patient is low-risk (and has no known or suspected primary neoplasm). Non-contrast chest CT can be considered in 12 months if patient is high-risk. This recommendation follows the consensus statement: Guidelines for Management of Incidental Pulmonary Nodules Detected on CT Images: From the Fleischner Society 2017; Radiology 2017; 284:228-243. Aortic Atherosclerosis (ICD10-I70.0) and Emphysema (ICD10-J43.9). Electronically Signed   By: Lavonia Dana M.D.   On: 01/01/2017 12:12   Ct Angio Chest Pe W Or Wo Contrast  Result Date: 01/02/2017 CLINICAL DATA:  83 year old female with shortness of breath and cough. Elevated D-dimer. EXAM: CT ANGIOGRAPHY CHEST WITH CONTRAST TECHNIQUE: Multidetector CT imaging of the chest was performed using the standard protocol during bolus administration of intravenous contrast. Multiplanar CT image reconstructions and MIPs were obtained to evaluate the vascular anatomy. CONTRAST:  100 cc Isovue 370 COMPARISON:  Chest CT dated 01/01/2017 FINDINGS: Cardiovascular: There is stable moderate cardiomegaly. Multi vessel coronary vascular calcification primarily involving the LAD, left circumflex artery noted. There is no pericardial effusion. Right pectoral pacemaker device noted. There is moderate atherosclerotic calcification of the thoracic aorta. The aorta is otherwise unremarkable for the degree  of enhancement. There is no CT evidence of pulmonary embolism. Mediastinum/Nodes: No hilar adenopathy. Stable appearing subcarinal lymph node measuring 8 mm in short axis. Small AP window lymph nodes appear similar to prior CT. Esophagus is grossly unremarkable. The thyroid gland is heterogeneous. Lungs/Pleura: There small bilateral pleural effusions, slightly increased compared to the prior CT. There is associated subsegmental atelectasis of the lung bases. Bibasilar linear scarring noted. There is mild diffuse interlobular septal prominence most consistent with edema. Stable  focal area of consolidative change in the lingula likely represents atelectasis/scarring. Stable right upper lobe linear atelectasis/ scarring noted. There is no pneumothorax. The central airways are patent. Upper Abdomen: Multiple scattered hepatic hypodense lesions, incompletely characterized but demonstrate fluid attenuation most consistent with cysts. There is passive flow of contrast from the right atrium into the IVC consistent with a degree of right cardiac dysfunction. Musculoskeletal: Osteopenia with degenerative changes of the spine. Old healed right rib fractures. Stable T9 compression fracture. No acute fracture. Review of the MIP images confirms the above findings. IMPRESSION: 1. No CT evidence of pulmonary embolism. 2. Cardiomegaly with mild interstitial edema. 3. Small bilateral pleural effusions, similar or slightly increased compared to prior CT. 4. Bibasilar subsegmental atelectasis versus infiltrate. Linear bibasilar atelectasis/scarring. Focal consolidative change in the lingula appears similar to the prior CT. 5.  Aortic Atherosclerosis (ICD10-I70.0). Electronically Signed   By: Anner Crete M.D.   On: 01/02/2017 04:48   Ct Lumbar Spine Wo Contrast  Result Date: 01/01/2017 CLINICAL DATA:  Severe LEFT leg pain, and low back pain. Chronic but acutely progressive. EXAM: CT LUMBAR SPINE WITHOUT CONTRAST TECHNIQUE: Multidetector CT imaging of the lumbar spine was performed without intravenous contrast administration. Multiplanar CT image reconstructions were also generated. COMPARISON:  CT lumbar spine 05/24/2005. FINDINGS: Segmentation: Standard Alignment: Normal. Vertebrae: No acute fracture or focal pathologic process. Incompletely visualized Tarlov cyst at S2 appears to result in bony remodeling. Paraspinal and other soft tissues: Aortic atherosclerosis. No masses. Disc levels: L1-L2: Fairly good preservation of disc height. Osseous spurring. No definite impingement. L2-L3: Fairly good  preservation of disc height. Osseous spurring facet arthropathy. Broad-based disc protrusion. LEFT subarticular zone and foraminal zone narrowing. LEFT L2 and LEFT L3 nerve root impingement. Mild stenosis. L3-L4: Disc space narrowing, worse on the LEFT. Osseous spurring. Broad-based disc protrusion central and to the LEFT, partially calcified. Facet arthropathy. Moderate to severe stenosis. BILATERAL LEFT greater than RIGHT L3 and L4 nerve root impingement. L4-L5: Disc space narrowing, worse on the RIGHT. Osseous spurring. Facet arthropathy. Central disc protrusion, partially calcified, eccentric to the RIGHT. Severe stenosis. RIGHT greater than LEFT L4 and L5 nerve root impingement. L5-S1: Disc space narrowing. Central protrusion. Posterior element hypertrophy. Mild stenosis. RIGHT greater than LEFT L5 and S1 nerve root impingement. Compared with study 12 years ago, there is progression of disc disease at all levels. IMPRESSION: Advanced multilevel spondylosis. Potentially symptomatic neural impingement at multiple levels; LEFT-sided predominant at L2-3 and L3-4; see comments above. No acute compression fracture or traumatic subluxation. Electronically Signed   By: Staci Righter M.D.   On: 01/01/2017 12:10   Dg Foot Complete Left  Result Date: 01/01/2017 CLINICAL DATA:  Left foot pain since hitting her foot on something 3 weeks ago. EXAM: LEFT FOOT - COMPLETE 3+ VIEW COMPARISON:  Left ankle radiographs obtained at the same time. FINDINGS: Hallux valgus. Calcifications medial to the first metatarsal head and in the plantar soft tissues of the proximal foot. No fracture or dislocation seen. Minimal inferior  calcaneal spur formation. IMPRESSION: No fracture. Electronically Signed   By: Claudie Revering M.D.   On: 01/01/2017 11:48   Dg Hip Unilat With Pelvis 2-3 Views Left  Result Date: 12/28/2016 CLINICAL DATA:  Left hip and knee pain for 1 week.  No known injury. EXAM: DG HIP (WITH OR WITHOUT PELVIS) 2-3V LEFT  COMPARISON:  None. FINDINGS: No acute bony or joint abnormality is identified. The patient has moderate to moderately severe osteoarthritis about the left hip with subchondral sclerosis and osteophytosis identified. No focal bony lesion. Surrounding soft tissue structures are unremarkable. IMPRESSION: No acute finding. Moderate to moderately severe left hip osteoarthritis. Electronically Signed   By: Inge Rise M.D.   On: 12/28/2016 11:04    Labs:  CBC:  Recent Labs  10/10/16 2334 10/11/16 0528 01/01/17 1155 01/02/17 0545  WBC 14.9* 11.5* 9.6 7.3  HGB 13.5 12.6 13.0 11.9*  HCT 43.1 41.7 40.6 38.0  PLT 360 341 292 266    COAGS: No results for input(s): INR, APTT in the last 8760 hours.  BMP:  Recent Labs  10/11/16 0528 10/13/16 1104 01/01/17 1155 01/02/17 0545  NA 137 141 143 138  K 3.9 4.0 3.6 3.8  CL 97* 103 105 101  CO2 33* 31 29 29   GLUCOSE 116* 89 89 90  BUN 24* 14 18 13   CALCIUM 8.5* 8.9 8.7* 8.3*  CREATININE 0.87 0.77 0.78 0.73  GFRNONAA 60* >60 >60 >60  GFRAA >60 >60 >60 >60    LIVER FUNCTION TESTS:  Recent Labs  07/15/16 0957 10/05/16 0929 10/10/16 2334 01/01/17 1155  BILITOT 0.8 0.8 0.7 1.3*  AST 24 19 16 22   ALT 14 10* 12* 15  ALKPHOS 56 62 79 84  PROT 6.6 6.3* 6.6 6.9  ALBUMIN 3.5 3.2* 2.9* 3.6    TUMOR MARKERS: No results for input(s): AFPTM, CEA, CA199, CHROMGRNA in the last 8760 hours.  Assessment and Plan:  New painful Thoracic 9 fracture (not seen on April 2018 CT) Scheduled for probable T9 VP/KP procedure Wed 7/18 Will continue to hold [employer] is in for authorization now Risks and Benefits discussed with the patient and daughter including, but not limited to education regarding the natural healing process of compression fractures without intervention, bleeding, infection, cement migration which may cause spinal cord damage, paralysis, pulmonary embolism or even death. All of the patient's and daughters questions  were answered, patient is agreeable to proceed. Consent signed and in chart.  Thank you for this interesting consult.  I greatly enjoyed meeting LUCI BELLUCCI and look forward to participating in their care.  A copy of this report was sent to the requesting provider on this date.  Electronically Signed: Lavonia Drafts, PA-C 01/02/2017, 1:01 PM   I spent a total of 40 Minutes    in face to face in clinical consultation, greater than 50% of which was counseling/coordinating care for Thoracic 9 VP/KP

## 2017-01-02 NOTE — Evaluation (Signed)
Physical Therapy Evaluation Patient Details Name: [name] MRN: [id number] DOB: 05/29/1931 Today's Date: 01/02/2017   History of Present Illness  [name] is a 83 y.o. female with medical history significant of A Fib on Eliquis, complete heart block s/p pacemaker, CHF, COPD, polymyalgia rheumatica on chronic steroids, HTN who presents with few week history of left leg pain, T9 compression fx (IR pending)  Clinical Impression  Pt admitted with above diagnosis. Pt currently with functional limitations due to the deficits listed below (see PT Problem List). Pt will benefit from skilled PT to increase their independence and safety with mobility to allow discharge to the venue listed below.  Pt did have back pain with mobility, not painful at rest;  Pt is reluctant to accept assistance or consider a permanent change of her living situation; recommend SNF at this time as pt is mod assist with basic mobility, has been in hospital 4 times in last 6 mos and dtr is out of town for work frequently and can not care for pt      Follow Up Recommendations SNF    Equipment Recommendations  None recommended by PT    Recommendations for Other Services       Precautions / Restrictions Precautions Precautions: Fall      Mobility  Bed Mobility Overal bed mobility: Needs Assistance Bed Mobility: Rolling;Sidelying to Sit;Sit to Supine Rolling: Mod assist Sidelying to sit: Mod assist   Sit to supine: Mod assist;+2 for physical assistance;+2 for safety/equipment   General bed mobility comments: assist with LEs and trunk, extra time needed, heavy use of rail, cues for back precautions d/t compression fx/pain--pt did not adhere to precautions  Transfers Overall transfer level: Needs assistance Equipment used: Rolling walker (2 wheeled) Transfers: Sit to/from Stand Sit to Stand: Mod assist         General transfer comment: extra time, cues for hand placement and control of  descent  Ambulation/Gait Ambulation/Gait assistance: Min assist           General Gait Details: 4 small lateral steps along EOB, requires cues for seqeuencing and incr time; mod assist with RW  Stairs            Wheelchair Mobility    Modified Rankin (Stroke Patients Only)       Balance Overall balance assessment: Needs assistance Sitting-balance support: Feet supported;No upper extremity supported Sitting balance-Leahy Scale: Fair Sitting balance - Comments: unable to wt shift, unable to don slipper socks     Standing balance-Leahy Scale: Poor Standing balance comment: heavily reliant on UEs and support form therapist (poor to zero)             High level balance activites: Side stepping High Level Balance Comments: with mod assist             Pertinent Vitals/Pain Pain Assessment: 0-10 Pain Score: 4  Pain Location: LLE Pain Intervention(s): Limited activity within patient's tolerance;Monitored during session    Home Living Family/patient expects to be discharged to:: Private residence Living Arrangements: Alone Available Help at Discharge: Family Type of Home: House Home Access: Stairs to enter     Home Layout: One level Home Equipment: Environmental consultant - 2 wheels;Cane - single point;Bedside commode Additional Comments: daughter lives nearby; dtr works and is out of town a lot    Prior Function Level of Independence: Independent;Independent with assistive device(s)         Comments: using walker the last 2 weeks prior to adm;  drives short distances only, eats mostly fast food; pt dtr reports pt has been declining, not able to consistently care for herself or her home     Hand Dominance        Extremity/Trunk Assessment   Upper Extremity Assessment Upper Extremity Assessment: Defer to OT evaluation    Lower Extremity Assessment Lower Extremity Assessment: Generalized weakness       Communication   Communication: No difficulties   Cognition Arousal/Alertness: Awake/alert Behavior During Therapy: WFL for tasks assessed/performed Overall Cognitive Status: Within Functional Limits for tasks assessed                                        General Comments      Exercises     Assessment/Plan    PT Assessment Patient needs continued PT services  PT Problem List Decreased strength;Decreased activity tolerance;Decreased balance;Decreased mobility;Decreased safety awareness;Pain       PT Treatment Interventions DME instruction;Gait training;Functional mobility training;Therapeutic activities;Therapeutic exercise;Patient/family education    PT Goals (Current goals can be found in the Care Plan section)  Acute Rehab PT Goals Patient Stated Goal: to get better, go back home PT Goal Formulation: With patient/family Time For Goal Achievement: 01/16/17 Potential to Achieve Goals: Good    Frequency Min 3X/week   Barriers to discharge Decreased caregiver support      Co-evaluation               AM-PAC PT "6 Clicks" Daily Activity  Outcome Measure Difficulty turning over in bed (including adjusting bedclothes, sheets and blankets)?: Total Difficulty moving from lying on back to sitting on the side of the bed? : Total Difficulty sitting down on and standing up from a chair with arms (e.g., wheelchair, bedside commode, etc,.)?: Total Help needed moving to and from a bed to chair (including a wheelchair)?: A Lot Help needed walking in hospital room?: A Lot Help needed climbing 3-5 steps with a railing? : A Lot 6 Click Score: 9    End of Session Equipment Utilized During Treatment: Gait belt Activity Tolerance: Patient limited by fatigue;Patient limited by pain Patient left: in bed;with call bell/phone within reach;with family/visitor present;with bed alarm set   PT Visit Diagnosis: History of falling (Z91.81);Unsteadiness on feet (R26.81);Pain Pain - part of body:  (back)    Time:  4580-9983 PT Time Calculation (min) (ACUTE ONLY): 33 min   Charges:   PT Evaluation $PT Eval Low Complexity: 1 Procedure PT Treatments $Therapeutic Activity: 8-22 mins   PT G Codes:   PT G-Codes **NOT FOR INPATIENT CLASS** Functional Assessment Tool Used: AM-PAC 6 Clicks Basic Mobility;Clinical judgement Functional Limitation: Changing and maintaining body position Changing and Maintaining Body Position Goal Status (J8250): At least 40 percent but less than 60 percent impaired, limited or restricted Changing and Maintaining Body Position Discharge Status [id number]): At least 20 percent but less than 40 percent impaired, limited or restricted    Kenyon Ana, PT Pager: [phone] 01/02/2017   [facility] 01/02/2017, 11:55 AM

## 2017-01-03 LAB — CBC
HCT: 39.1 % (ref 36.0–46.0)
HEMOGLOBIN: 12.4 g/dL (ref 12.0–15.0)
MCH: 28.3 pg (ref 26.0–34.0)
MCHC: 31.7 g/dL (ref 30.0–36.0)
MCV: 89.3 fL (ref 78.0–100.0)
Platelets: 286 10*3/uL (ref 150–400)
RBC: 4.38 MIL/uL (ref 3.87–5.11)
RDW: 14.9 % (ref 11.5–15.5)
WBC: 10.2 10*3/uL (ref 4.0–10.5)

## 2017-01-03 LAB — BASIC METABOLIC PANEL
Anion gap: 9 (ref 5–15)
BUN: 16 mg/dL (ref 6–20)
CHLORIDE: 96 mmol/L — AB (ref 101–111)
CO2: 32 mmol/L (ref 22–32)
CREATININE: 0.9 mg/dL (ref 0.44–1.00)
Calcium: 8.7 mg/dL — ABNORMAL LOW (ref 8.9–10.3)
GFR calc non Af Amer: 58 mL/min — ABNORMAL LOW (ref >60)
Glucose, Bld: 159 mg/dL — ABNORMAL HIGH (ref 65–99)
Potassium: 3.4 mmol/L — ABNORMAL LOW (ref 3.5–5.1)
SODIUM: 137 mmol/L (ref 135–145)

## 2017-01-03 MED ORDER — FUROSEMIDE 40 MG PO TABS
40.0000 mg | ORAL_TABLET | Freq: Once | ORAL | Status: AC
Start: 2017-01-03 — End: 2017-01-03
  Administered 2017-01-03: 40 mg via ORAL
  Filled 2017-01-03: qty 1

## 2017-01-03 MED ORDER — KETOROLAC TROMETHAMINE 15 MG/ML IJ SOLN
15.0000 mg | Freq: Four times a day (QID) | INTRAMUSCULAR | Status: AC | PRN
  Administered 2017-01-03: 15 mg via INTRAVENOUS
  Filled 2017-01-03: qty 1

## 2017-01-03 MED ORDER — POTASSIUM CHLORIDE CRYS ER 20 MEQ PO TBCR
40.0000 meq | EXTENDED_RELEASE_TABLET | Freq: Once | ORAL | Status: AC
  Administered 2017-01-03: 40 meq via ORAL
  Filled 2017-01-03: qty 2

## 2017-01-03 NOTE — Progress Notes (Signed)
PROGRESS NOTE    [name]  ZOX:[id number] DOB: Dec 23, 1930 DOA: 01/01/2017 PCP: Lajean Manes, MD  Brief Narrative:[name] is a 83 y.o. female with medical history significant of A Fib on Eliquis, complete heart block s/p pacemaker, CHF, COPD, polymyalgia rheumatica on chronic steroids, HTN who presents with few week history of left leg pain and low back pain., She reportedly she stubbed her left great toe and since then, she has had significant left ankle pain that has limited her mobility, and now is using a walker.  She also complaint of severe back pain, In ED: Left ankle and left foot x-ray was completed without acute abnormality, it did show mild-to-moderate diffuse lateral soft tissue swelling. CT chest without contrast revealed improving pneumonia (she had been previously admitted at the hospital in April for pneumonia) , as well as interval compression fracture of T9 vertebral body with 25%, anterior height loss. -stable now, IR consulting for kyphoplasty on Wednesday, Eliquis currently on hold  Assessment & Plan:  Subacute Low back pain/ T9 vertebral compression fracture new from imaging in April -for 1-2 weeks now, denies any trauma -Supportive care with Toradol -Discussed with patient and daughter they would like to be evaluated for possible kyphoplasty, IR consulted plan for Kyphoplasty on Wednesday, Eliquis on hold  -Physical therapy evaluation completed SNF recommended -social work consult  Acute on chronic systolic CHF with hypoxia -Not symptomatic  -has COPD at baseline -CTA chest with scarring from prior pneumonia and small pleural effusions, given IV lasix yesterday, appears euvolemic now, change to Po lasix today -last ECHO with EF 45-50%  Left ankle pain -Xray without bony abnormality. Showed soft tissue swelling. Could possibly be due to ankle sprain,  Cannot perform MRI due to pacemaker  -Supportive care, PT -improving  PMR -Continue chronic  prednisone dose   Depression/Anxiety -Continue home lexapro, xanax, remeron   HTN -Continue toprol   Paroxysmal A Fib -chadsvasc >3 -Holding eliquis for Kyphoplasty resume on Thursday, toprol  Complete heart block s/p pacemaker  -Continue cardiac monitoring   COPD  -Continue Dulera   DVT prophylaxis: On Eliquis Code Status: Full  Family Communication: daughter at bedside Disposition Plan: SNF ? thursday   Consultants:   IR   Subjective: Feels better today, meds helping, breathing improving too  Objective: Vitals:   01/02/17 1313 01/02/17 2144 01/03/17 0614 01/03/17 0859  BP: (!) 148/74 125/74 139/60   Pulse: 77 78 65   Resp:  18 18   Temp: (!) 97.5 F (36.4 C) 98.9 F (37.2 C) 98.3 F (36.8 C)   TempSrc: Oral Oral Oral   SpO2: 98% 97% 98% 97%  Weight:      Height:        Intake/Output Summary (Last 24 hours) at 01/03/17 1110 Last data filed at 01/02/17 2200  Gross per 24 hour  Intake              240 ml  Output                0 ml  Net              240 ml   Filed Weights   01/01/17 1036 01/01/17 1725  Weight: 59 kg (130 lb) 64.2 kg (141 lb 8.6 oz)    Examination:  Gen: Awake, Alert, oriented 3, comfortable HEENT: PERRLA, Neck supple, no JVD Lungs: Good air movement bilaterally, CTAB CVS: RRR,No Gallops,Rubs or new Murmurs Abd: soft, Non tender, non distended, BS  present Musculoskeletal, mild point tenderness over lower thoracic spine radiating to L mid axillary line Extremities: No Cyanosis, Clubbing or edema Skin: no new rashes   Data Reviewed:   CBC:  Recent Labs Lab 01/01/17 1155 01/02/17 0545 01/03/17 0442  WBC 9.6 7.3 10.2  NEUTROABS 6.9  --   --   HGB 13.0 11.9* 12.4  HCT 40.6 38.0 39.1  MCV 90.8 90.3 89.3  PLT 292 266 007   Basic Metabolic Panel:  Recent Labs Lab 01/01/17 1155 01/02/17 0545 01/03/17 0442  NA 143 138 137  K 3.6 3.8 3.4*  CL 105 101 96*  CO2 29 29 32  GLUCOSE 89 90 159*  BUN 18 13 16     CREATININE 0.78 0.73 0.90  CALCIUM 8.7* 8.3* 8.7*   GFR: Estimated Creatinine Clearance: 42.6 mL/min (by C-G formula based on SCr of 0.9 mg/dL). Liver Function Tests:  Recent Labs Lab 01/01/17 1155  AST 22  ALT 15  ALKPHOS 84  BILITOT 1.3*  PROT 6.9  ALBUMIN 3.6   No results for input(s): LIPASE, AMYLASE in the last 168 hours. No results for input(s): AMMONIA in the last 168 hours. Coagulation Profile:  Recent Labs Lab 01/02/17 1413  INR 1.25   Cardiac Enzymes: No results for input(s): CKTOTAL, CKMB, CKMBINDEX, TROPONINI in the last 168 hours. BNP (last 3 results) No results for input(s): PROBNP in the last 8760 hours. HbA1C: No results for input(s): HGBA1C in the last 72 hours. CBG: No results for input(s): GLUCAP in the last 168 hours. Lipid Profile: No results for input(s): CHOL, HDL, LDLCALC, TRIG, CHOLHDL, LDLDIRECT in the last 72 hours. Thyroid Function Tests: No results for input(s): TSH, T4TOTAL, FREET4, T3FREE, THYROIDAB in the last 72 hours. Anemia Panel: No results for input(s): VITAMINB12, FOLATE, FERRITIN, TIBC, IRON, RETICCTPCT in the last 72 hours. Urine analysis:    Component Value Date/Time   COLORURINE YELLOW 10/11/2016 0033   APPEARANCEUR CLEAR 10/11/2016 0033   LABSPEC 1.016 10/11/2016 0033   PHURINE 6.0 10/11/2016 0033   GLUCOSEU NEGATIVE 10/11/2016 0033   HGBUR SMALL (A) 10/11/2016 0033   BILIRUBINUR NEGATIVE 10/11/2016 0033   KETONESUR NEGATIVE 10/11/2016 0033   PROTEINUR NEGATIVE 10/11/2016 0033   UROBILINOGEN 1.0 01/19/2011 1030   NITRITE NEGATIVE 10/11/2016 0033   LEUKOCYTESUR NEGATIVE 10/11/2016 0033   Sepsis Labs: @LABRCNTIP (procalcitonin:4,lacticidven:4)  )No results found for this or any previous visit (from the past 240 hour(s)).       Radiology Studies: Dg Ankle Complete Left  Result Date: 01/01/2017 CLINICAL DATA:  Left ankle pain since hitting her foot on something 3 weeks ago. EXAM: LEFT ANKLE COMPLETE - 3+ VIEW  COMPARISON:  None. FINDINGS: Mild-to-moderate diffuse lateral soft tissue swelling. Mild talofibular spur formation. No fracture, dislocation or effusion seen. IMPRESSION: No fracture. Electronically Signed   By: Claudie Revering M.D.   On: 01/01/2017 11:46   Ct Chest Wo Contrast  Result Date: 01/01/2017 CLINICAL DATA:  Severe LEFT leg pain, low back and upper back pain, progressive pain over 3 days, cough and congestion, history pneumonia, benign essential hypertension, COPD, dermatomyositis, atrial fibrillation, CHF, polymyalgia rheumatica, former smoker EXAM: CT CHEST WITHOUT CONTRAST TECHNIQUE: Multidetector CT imaging of the chest was performed following the standard protocol without IV contrast. Sagittal and coronal MPR images reconstructed from axial data set. Respiratory motion artifacts degrade exam. COMPARISON:  CT chest 10/05/2016 FINDINGS: Cardiovascular: Atherosclerotic calcifications aorta, proximal great vessels and coronary arteries. Cardiac chambers appear mildly dilated. No pericardial effusion. Sequential pacemaker leads  via RIGHT subclavian approach at RIGHT ventricle and RIGHT atrium. Mediastinum/Nodes: Scattered normal sized mediastinal lymph nodes. No thoracic adenopathy. Esophagus unremarkable. Base of cervical region normal appearance Lungs/Pleura: Small BILATERAL pleural effusions. Hyperinflated lungs with scattered peribronchial thickening and diffuse interseptal thickening unchanged. Subsegmental atelectasis in BILATERAL lower lobes, lingula, and RIGHT middle lobe. Calcified granuloma RIGHT middle lobe. Scattered tiny nodules in periphery of RIGHT lung, some stable, additional potentially obscured by infiltrates on previous exam. No definite enlarging pulmonary mass or nodule. No segmental consolidation or pneumothorax. Upper Abdomen: Multiple hepatic cysts, better characterized on previous exam which last motion artifacts, grossly stable in appearance. Probable peripelvic cyst upper pole  LEFT kidney. Remaining visualized upper abdomen unremarkable. Musculoskeletal: Sclerosis at lateral RIGHT third fourth fifth sixth and seventh ribs from healing fractures. Diffuse osseous demineralization. Interval acute compression fracture of T9 vertebral body with 25% anterior height loss. IMPRESSION: Improved pulmonary infiltrates particularly in RIGHT lower lobe and RIGHT middle lobe since previous exam. Scattered subsegmental atelectasis and bronchitic changes with small BILATERAL pleural effusions. Hepatic cysts. Osseous demineralization with interval compression fracture of T9 vertebral body with approximately 25% anterior height loss. Tiny peripheral nonspecific nodular foci RIGHT lung, recommendation below. No follow-up needed if patient is low-risk (and has no known or suspected primary neoplasm). Non-contrast chest CT can be considered in 12 months if patient is high-risk. This recommendation follows the consensus statement: Guidelines for Management of Incidental Pulmonary Nodules Detected on CT Images: From the Fleischner Society 2017; Radiology 2017; 284:228-243. Aortic Atherosclerosis (ICD10-I70.0) and Emphysema (ICD10-J43.9). Electronically Signed   By: Lavonia Dana M.D.   On: 01/01/2017 12:12   Ct Angio Chest Pe W Or Wo Contrast  Result Date: 01/02/2017 CLINICAL DATA:  83 year old female with shortness of breath and cough. Elevated D-dimer. EXAM: CT ANGIOGRAPHY CHEST WITH CONTRAST TECHNIQUE: Multidetector CT imaging of the chest was performed using the standard protocol during bolus administration of intravenous contrast. Multiplanar CT image reconstructions and MIPs were obtained to evaluate the vascular anatomy. CONTRAST:  100 cc Isovue 370 COMPARISON:  Chest CT dated 01/01/2017 FINDINGS: Cardiovascular: There is stable moderate cardiomegaly. Multi vessel coronary vascular calcification primarily involving the LAD, left circumflex artery noted. There is no pericardial effusion. Right pectoral  pacemaker device noted. There is moderate atherosclerotic calcification of the thoracic aorta. The aorta is otherwise unremarkable for the degree of enhancement. There is no CT evidence of pulmonary embolism. Mediastinum/Nodes: No hilar adenopathy. Stable appearing subcarinal lymph node measuring 8 mm in short axis. Small AP window lymph nodes appear similar to prior CT. Esophagus is grossly unremarkable. The thyroid gland is heterogeneous. Lungs/Pleura: There small bilateral pleural effusions, slightly increased compared to the prior CT. There is associated subsegmental atelectasis of the lung bases. Bibasilar linear scarring noted. There is mild diffuse interlobular septal prominence most consistent with edema. Stable focal area of consolidative change in the lingula likely represents atelectasis/scarring. Stable right upper lobe linear atelectasis/ scarring noted. There is no pneumothorax. The central airways are patent. Upper Abdomen: Multiple scattered hepatic hypodense lesions, incompletely characterized but demonstrate fluid attenuation most consistent with cysts. There is passive flow of contrast from the right atrium into the IVC consistent with a degree of right cardiac dysfunction. Musculoskeletal: Osteopenia with degenerative changes of the spine. Old healed right rib fractures. Stable T9 compression fracture. No acute fracture. Review of the MIP images confirms the above findings. IMPRESSION: 1. No CT evidence of pulmonary embolism. 2. Cardiomegaly with mild interstitial edema. 3. Small bilateral pleural effusions,  similar or slightly increased compared to prior CT. 4. Bibasilar subsegmental atelectasis versus infiltrate. Linear bibasilar atelectasis/scarring. Focal consolidative change in the lingula appears similar to the prior CT. 5.  Aortic Atherosclerosis (ICD10-I70.0). Electronically Signed   By: Anner Crete M.D.   On: 01/02/2017 04:48   Ct Lumbar Spine Wo Contrast  Result Date:  01/01/2017 CLINICAL DATA:  Severe LEFT leg pain, and low back pain. Chronic but acutely progressive. EXAM: CT LUMBAR SPINE WITHOUT CONTRAST TECHNIQUE: Multidetector CT imaging of the lumbar spine was performed without intravenous contrast administration. Multiplanar CT image reconstructions were also generated. COMPARISON:  CT lumbar spine 05/24/2005. FINDINGS: Segmentation: Standard Alignment: Normal. Vertebrae: No acute fracture or focal pathologic process. Incompletely visualized Tarlov cyst at S2 appears to result in bony remodeling. Paraspinal and other soft tissues: Aortic atherosclerosis. No masses. Disc levels: L1-L2: Fairly good preservation of disc height. Osseous spurring. No definite impingement. L2-L3: Fairly good preservation of disc height. Osseous spurring facet arthropathy. Broad-based disc protrusion. LEFT subarticular zone and foraminal zone narrowing. LEFT L2 and LEFT L3 nerve root impingement. Mild stenosis. L3-L4: Disc space narrowing, worse on the LEFT. Osseous spurring. Broad-based disc protrusion central and to the LEFT, partially calcified. Facet arthropathy. Moderate to severe stenosis. BILATERAL LEFT greater than RIGHT L3 and L4 nerve root impingement. L4-L5: Disc space narrowing, worse on the RIGHT. Osseous spurring. Facet arthropathy. Central disc protrusion, partially calcified, eccentric to the RIGHT. Severe stenosis. RIGHT greater than LEFT L4 and L5 nerve root impingement. L5-S1: Disc space narrowing. Central protrusion. Posterior element hypertrophy. Mild stenosis. RIGHT greater than LEFT L5 and S1 nerve root impingement. Compared with study 12 years ago, there is progression of disc disease at all levels. IMPRESSION: Advanced multilevel spondylosis. Potentially symptomatic neural impingement at multiple levels; LEFT-sided predominant at L2-3 and L3-4; see comments above. No acute compression fracture or traumatic subluxation. Electronically Signed   By: Staci Righter M.D.   On:  01/01/2017 12:10   Dg Foot Complete Left  Result Date: 01/01/2017 CLINICAL DATA:  Left foot pain since hitting her foot on something 3 weeks ago. EXAM: LEFT FOOT - COMPLETE 3+ VIEW COMPARISON:  Left ankle radiographs obtained at the same time. FINDINGS: Hallux valgus. Calcifications medial to the first metatarsal head and in the plantar soft tissues of the proximal foot. No fracture or dislocation seen. Minimal inferior calcaneal spur formation. IMPRESSION: No fracture. Electronically Signed   By: Claudie Revering M.D.   On: 01/01/2017 11:48        Scheduled Meds: . ALPRAZolam  0.25 mg Oral QHS  . escitalopram  10 mg Oral Daily  . metoprolol succinate  25 mg Oral Daily  . mirtazapine  7.5 mg Oral QHS  . mometasone-formoterol  2 puff Inhalation BID  . predniSONE  10 mg Oral Daily  . sodium chloride flush  3 mL Intravenous Q12H   Continuous Infusions:   LOS: 1 day    Time spent: 50min    Domenic Polite, MD [facility] Pager [phone]  If 7PM-7AM, please contact night-coverage [email or web address] Password [email or web address] 01/03/2017, 11:10 AM

## 2017-01-03 NOTE — Progress Notes (Signed)
IR following for vertebroplasty/kyphoplasty.  Patient on Elliquis which is currently being held.  Anticipate procedure tomorrow.  Orders in place to continue holding blood thinners and for patient to be NPO after midnight.   Brynda Greathouse, MMS RDN PA-C 1:28 PM

## 2017-01-03 NOTE — Consult Note (Addendum)
   [facility] CM Inpatient Consult   01/03/2017  YANNIS GUMBS 11-May-1931 [id number]    Patient screened for [facility] Care Management program.   Chart reviewed. Noted discharge plan likely SNF.   Spoke with inpatient RNCM and inpatient LCSW.    If discharge plans should change, please consider placing [place] Management consult.    Marthenia Rolling, MSN-Ed, RN,BSN [facility] Liaison [phone]

## 2017-01-03 NOTE — Progress Notes (Signed)
Occupational Therapy Treatment Patient Details Name: [name] MRN: [id number] DOB: 1930/10/17 Today's Date: 01/03/2017    History of present illness [name] is a 83 y.o. female with medical history significant of A Fib on Eliquis, complete heart block s/p pacemaker, CHF, COPD, polymyalgia rheumatica on chronic steroids, HTN who presents with few week history of left leg pain, T9 compression fx   OT comments  Pt agreed to OOB! Pt in better spirits this day  Follow Up Recommendations  SNF    Equipment Recommendations  None recommended by OT       Precautions / Restrictions Precautions Precautions: Fall       Mobility Bed Mobility Overal bed mobility: Needs Assistance Bed Mobility: Rolling;Sidelying to Sit;Sit to Supine Rolling: Min assist Sidelying to sit: Min assist   Sit to supine: Min assist      Transfers Overall transfer level: Needs assistance Equipment used: Rolling walker (2 wheeled) Transfers: Sit to/from Stand Sit to Stand: Min assist                  ADL either performed or assessed with clinical judgement   ADL                           Toilet Transfer: Moderate assistance;RW;Cueing for sequencing;Cueing for safety;Ambulation Toilet Transfer Details (indicate cue type and reason): bed to chair           General ADL Comments: pt agreed to OOB.  Oked with MD as pt goiing to have kyphoplasty next day     Vision Patient Visual Report: No change from baseline            Cognition Arousal/Alertness: Awake/alert Behavior During Therapy: WFL for tasks assessed/performed Overall Cognitive Status: Within Functional Limits for tasks assessed                                                General Comments  educated on protecting back during OT session.      Pertinent Vitals/ Pain       Pain Score: 4  Pain Location: LLE Pain Intervention(s): Limited activity within patient's tolerance  Home  Living                                              Frequency  Min 2X/week        Progress Toward Goals  OT Goals(current goals can now be found in the care plan section)  Progress towards OT goals: Progressing toward goals     Plan Discharge plan remains appropriate    Co-evaluation                 AM-PAC PT "6 Clicks" Daily Activity     Outcome Measure   Help from another person eating meals?: None Help from another person taking care of personal grooming?: A Little Help from another person toileting, which includes using toliet, bedpan, or urinal?: A Lot Help from another person bathing (including washing, rinsing, drying)?: A Lot Help from another person to put on and taking off regular upper body clothing?: A Little Help from another person to put on and taking off regular lower body clothing?:  A Lot 6 Click Score: 16    End of Session Equipment Utilized During Treatment: Gait belt  OT Visit Diagnosis: Unsteadiness on feet (R26.81);Muscle weakness (generalized) (M62.81);Pain;History of falling (Z91.81)   Activity Tolerance Patient tolerated treatment well   Patient Left in bed;with call bell/phone within reach   Nurse Communication Mobility status        Time: 1141-1155 OT Time Calculation (min): 14 min  Charges: OT General Charges $OT Visit: 1 Procedure OT Treatments $Self Care/Home Management : 8-22 mins  [place], [place]   Betsy Pries 01/03/2017, 12:44 PM

## 2017-01-04 ENCOUNTER — Inpatient Hospital Stay (HOSPITAL_COMMUNITY): Payer: Medicare Other

## 2017-01-04 ENCOUNTER — Encounter (HOSPITAL_COMMUNITY): Payer: Self-pay | Admitting: Interventional Radiology

## 2017-01-04 DIAGNOSIS — M4854XA Collapsed vertebra, not elsewhere classified, thoracic region, initial encounter for fracture: Secondary | ICD-10-CM

## 2017-01-04 HISTORY — PX: IR KYPHO THORACIC WITH BONE BIOPSY: IMG5518

## 2017-01-04 LAB — BASIC METABOLIC PANEL
Anion gap: 7 (ref 5–15)
BUN: 26 mg/dL — ABNORMAL HIGH (ref 6–20)
CHLORIDE: 99 mmol/L — AB (ref 101–111)
CO2: 33 mmol/L — AB (ref 22–32)
CREATININE: 1.01 mg/dL — AB (ref 0.44–1.00)
Calcium: 8.9 mg/dL (ref 8.9–10.3)
GFR calc non Af Amer: 50 mL/min — ABNORMAL LOW (ref >60)
GFR, EST AFRICAN AMERICAN: 58 mL/min — AB (ref >60)
Glucose, Bld: 116 mg/dL — ABNORMAL HIGH (ref 65–99)
POTASSIUM: 4.3 mmol/L (ref 3.5–5.1)
SODIUM: 139 mmol/L (ref 135–145)

## 2017-01-04 MED ORDER — CEFAZOLIN SODIUM-DEXTROSE 2-4 GM/100ML-% IV SOLN
INTRAVENOUS | Status: AC
  Administered 2017-01-04: 2000 mg
  Filled 2017-01-04: qty 100

## 2017-01-04 MED ORDER — LIDOCAINE HCL 1 % IJ SOLN
INTRAMUSCULAR | Status: AC | PRN
  Administered 2017-01-04: 15 mL

## 2017-01-04 MED ORDER — FENTANYL CITRATE (PF) 100 MCG/2ML IJ SOLN
INTRAMUSCULAR | Status: AC
  Filled 2017-01-04: qty 4

## 2017-01-04 MED ORDER — LIDOCAINE HCL (PF) 1 % IJ SOLN
INTRAMUSCULAR | Status: AC
  Filled 2017-01-04: qty 30

## 2017-01-04 MED ORDER — FENTANYL CITRATE (PF) 100 MCG/2ML IJ SOLN
INTRAMUSCULAR | Status: AC | PRN
  Administered 2017-01-04 (×2): 50 ug via INTRAVENOUS

## 2017-01-04 MED ORDER — MIDAZOLAM HCL 2 MG/2ML IJ SOLN
INTRAMUSCULAR | Status: AC
  Filled 2017-01-04: qty 6

## 2017-01-04 MED ORDER — KETOROLAC TROMETHAMINE 15 MG/ML IJ SOLN
15.0000 mg | Freq: Four times a day (QID) | INTRAMUSCULAR | Status: AC | PRN
  Administered 2017-01-04: 15 mg via INTRAVENOUS
  Filled 2017-01-04: qty 1

## 2017-01-04 MED ORDER — APIXABAN 2.5 MG PO TABS: 2.5000 mg | ORAL_TABLET | Freq: Two times a day (BID) | ORAL | Status: DC

## 2017-01-04 MED ORDER — MIDAZOLAM HCL 2 MG/2ML IJ SOLN
INTRAMUSCULAR | Status: AC | PRN
Start: 2017-01-04 — End: 2017-01-04
  Administered 2017-01-04: 1 mg via INTRAVENOUS
  Administered 2017-01-04: 0.5 mg via INTRAVENOUS
  Administered 2017-01-04: 1 mg via INTRAVENOUS
  Administered 2017-01-04: 0.5 mg via INTRAVENOUS

## 2017-01-04 MED ORDER — IOPAMIDOL (ISOVUE-300) INJECTION 61%
INTRAVENOUS | Status: AC
  Filled 2017-01-04: qty 50

## 2017-01-04 NOTE — Sedation Documentation (Signed)
Patient is resting comfortably. 

## 2017-01-04 NOTE — Clinical Social Work Note (Signed)
Clinical Social Work Assessment  Patient Details  Name: [name] MRN: [id number] Date of Birth: [birth year]  Date of referral:  01/04/17               Reason for consult:  Facility Placement                Permission sought to share information with:  Chartered certified accountant granted to share information::  Yes, Verbal Permission Granted  Name::        Agency::  [facility]  Relationship::     Contact Information:     Housing/Transportation Living arrangements for the past 2 months:  [place] of Information:  Adult Children, Patient Patient Interpreter Needed:  None Criminal Activity/Legal Involvement Pertinent to Current Situation/Hospitalization:  No - Comment as needed Significant Relationships:  Adult Children Lives with:  Facility Resident Do you feel safe going back to the place where you live?  Yes Need for family participation in patient care:  Yes (Comment)  Care giving concerns:  Patient resides in ALF. PT recommended SNF for ST rehab to increase independence.    Social Worker assessment / plan:  CSW spoke with patient and patient's daughter at bedside. CSW and patient/patient's daughter discussed PT recommendation for ST rehab at [facility]. Patient's daughter reported that the plan is for patient to return to [facility] at discharge with HHPT. Patient's daughter reported that patient has participated in [place] at [facility] in the past. [place] notified RNCM. CSW will continue to follow and assist with discharge planning back to ALF when patient is medically stable.   Employment status:    Insurance information:  Medicare PT Recommendations:  [place] / Referral to community resources:  Other (Comment Required) (RNCM for HHPT)  Patient/Family's Response to care:  Patient/patient's daughter plan to return to current ALF with HHPT versus ST rehab at [facility].  Patient/Family's  Understanding of and Emotional Response to Diagnosis, Current Treatment, and Prognosis:  Patient presented hopeful that she will start to feel better. Patient's daughter verbalized understanding of patient's diagnosis and current treatment. Patient's daughter appears to be active in patient's care.  Emotional Assessment Appearance:  Appears stated age Attitude/Demeanor/Rapport:    Affect (typically observed):  Hopeful Orientation:  Oriented to Self, Oriented to Situation Alcohol / Substance use:  Not Applicable Psych involvement (Current and /or in the community):  No (Comment)  Discharge Needs  Concerns to be addressed:  No discharge needs identified Readmission within the last 30 days:  No Current discharge risk:  None Barriers to Discharge:  No Barriers Identified   Debbie Medin, LCSW 01/04/2017, 9:47 AM

## 2017-01-04 NOTE — Progress Notes (Signed)
PT Cancellation Note  Patient Details Name: [name] MRN: [id number] DOB: April 02, 1931   Cancelled Treatment:    Reason Eval/Treat Not Completed: Patient at procedure or test/unavailable--pt awaiting KP by IR. Will follow up after procedure when schedule allows. Thanks.    Weston Anna, MPT Pager: [phone]

## 2017-01-04 NOTE — Procedures (Signed)
Interventional Radiology Procedure Note  Procedure: T9 kyphoplasty  Complications: None  Estimated Blood Loss: <25 mL  Recommendations: - Bedrest x 3 hrs - Continue pain control - Follow-up in clinic in 2 weeks  Signed,  Criselda Peaches, MD

## 2017-01-04 NOTE — Progress Notes (Signed)
Able to log roll pt while in bed for urinal. Dressing noted to left upper back CDI. Pt tolerated well. Denies pain. Tolerated dinner with no complaints. Removed O2 2lnc at  1800. SPO2 at 1824 98%RA, using right ear.N Pt with nail polish on fingernails. Maintained for 10 minutes with no complaints or SOB.  Pt has nail polish on nails. Denies SOB. Respirations even and non labored. Pt is comfortable on RA.

## 2017-01-04 NOTE — Progress Notes (Signed)
PROGRESS NOTE    [name]  BWG:[id number] DOB: Dec 03, 1930 DOA: 01/01/2017 PCP: Lajean Manes, MD     Brief Narrative:  [name] is a 83 y.o. female with medical history significant of A Fib on Eliquis, complete heart block s/p pacemaker, CHF, COPD, polymyalgia rheumatica on chronic steroids, HTN who presents with few week history of left leg pain. She states that she was in of normal health and normal mobility when she stubbed her left great toe. Since then, she has had significant left ankle pain that has limited her mobility, and now is using a walker. She was evaluated at her PCP office to thought this may be PMR flareup. At that time, her steroid dose was doubled, however, she did not have any relief. She also admits to back pain, but thought that it was due to laying around. She denies any fevers or chills, chest pain or shortness of breath, cough, nausea, vomiting, diarrhea or abdominal pain, dysuria. Even though she was found to be hypoxemic in the emergency department, she denies any symptomatic shortness of breath or labored breathing. She currently lives alone at home, but due to her decreased mobility as well as pain, she does not feel to be at home alone.  Assessment & Plan:   Principal Problem:   Non-traumatic compression fracture of T9 thoracic vertebra (HCC) Active Problems:   SSS (sick sinus syndrome) (HCC)   Paroxysmal atrial fibrillation (HCC)   Diastolic CHF (HCC)   COPD (chronic obstructive pulmonary disease) (HCC)   Acute hypoxemic respiratory failure (HCC)   Essential hypertension   Polymyalgia rheumatica (HCC)  Acute hypoxemic respiratory failure -Secondary to acute on chronic systolic CHF  -CT chest showed improving pulmonary infiltrates from her pneumonia dx in April  -CTA chest negative for PE  -Received IV lasix, now euvolemic, wean O2   Compression fracture T9 -IR for kyphoplasty 7/18 -Continue supportive care  Left ankle pain -Xray  without bony abnormality. Showed soft tissue swelling. Could possibly be due to ankle sprain. Cannot perform MRI due to pacemaker  -Supportive care  -PT OT   PMR -Continue chronic prednisone dose   Depression/Anxiety -Continue home lexapro, xanax, remeron   HTN -Continue toprol   Paroxysmal A Fib -CHADSVASc 3 -Continue eliquis   Complete heart block s/p pacemaker  -Continue cardiac monitoring   COPD  -Continue Dulera  -Without acute exacerbation   DVT prophylaxis: Resume eliquis Code Status: Full Family Communication: daughter at bedside Disposition Plan: SNF   Consultants:   IR   Procedures:   Kyphoplasty 7/18  Antimicrobials:  Anti-infectives    Start     Dose/Rate Route Frequency Ordered Stop   01/04/17 1239  ceFAZolin (ANCEF) 2-4 GM/100ML-% IVPB    Comments:  Alfred Levins   : cabinet override      01/04/17 1239 01/04/17 1250        Subjective: Continues to have back pain with movement, but better when at rest. No chest pain, SOB.   Objective: Vitals:   01/04/17 1329 01/04/17 1335 01/04/17 1419 01/04/17 1440  BP: (!) 80/51 122/65 138/63 136/67  Pulse: 70 70  70  Resp: 12 16 16 16   Temp:   98.2 F (36.8 C) 98.2 F (36.8 C)  TempSrc:   Oral Oral  SpO2: 100% 100% 100% 98%  Weight:      Height:        Intake/Output Summary (Last 24 hours) at 01/04/17 1452 Last data filed at 01/04/17 1300  Gross  per 24 hour  Intake              240 ml  Output             1000 ml  Net             -760 ml   Filed Weights   01/01/17 1036 01/01/17 1725  Weight: 59 kg (130 lb) 64.2 kg (141 lb 8.6 oz)    Examination:  General exam: Appears calm and comfortable  Respiratory system: Clear to auscultation. Respiratory effort normal. On T[place]ph O2  Cardiovascular system: S1 & S2 heard, RRR. No JVD, murmurs, rubs, gallops or clicks. No pedal edema. Gastrointestinal system: Abdomen is nondistended, soft and nontender. No organomegaly or masses felt. Normal  bowel sounds heard. Central nervous system: Alert and oriented. No focal neurological deficits. Extremities: Symmetric 5 x 5 power. Skin: No rashes, lesions or ulcers Psychiatry: Judgement and insight appear normal. Mood & affect appropriate.   Data Reviewed: I have personally reviewed following labs and imaging studies  CBC:  Recent Labs Lab 01/01/17 1155 01/02/17 0545 01/03/17 0442  WBC 9.6 7.3 10.2  NEUTROABS 6.9  --   --   HGB 13.0 11.9* 12.4  HCT 40.6 38.0 39.1  MCV 90.8 90.3 89.3  PLT 292 266 616   Basic Metabolic Panel:  Recent Labs Lab 01/01/17 1155 01/02/17 0545 01/03/17 0442 01/04/17 0420  NA 143 138 137 139  K 3.6 3.8 3.4* 4.3  CL 105 101 96* 99*  CO2 29 29 32 33*  GLUCOSE 89 90 159* 116*  BUN 18 13 16  26*  CREATININE 0.78 0.73 0.90 1.01*  CALCIUM 8.7* 8.3* 8.7* 8.9   GFR: Estimated Creatinine Clearance: 38 mL/min (A) (by C-G formula based on SCr of 1.01 mg/dL (H)). Liver Function Tests:  Recent Labs Lab 01/01/17 1155  AST 22  ALT 15  ALKPHOS 84  BILITOT 1.3*  PROT 6.9  ALBUMIN 3.6   No results for input(s): LIPASE, AMYLASE in the last 168 hours. No results for input(s): AMMONIA in the last 168 hours. Coagulation Profile:  Recent Labs Lab 01/02/17 1413  INR 1.25   Cardiac Enzymes: No results for input(s): CKTOTAL, CKMB, CKMBINDEX, TROPONINI in the last 168 hours. BNP (last 3 results) No results for input(s): PROBNP in the last 8760 hours. HbA1C: No results for input(s): HGBA1C in the last 72 hours. CBG: No results for input(s): GLUCAP in the last 168 hours. Lipid Profile: No results for input(s): CHOL, HDL, LDLCALC, TRIG, CHOLHDL, LDLDIRECT in the last 72 hours. Thyroid Function Tests: No results for input(s): TSH, T4TOTAL, FREET4, T3FREE, THYROIDAB in the last 72 hours. Anemia Panel: No results for input(s): VITAMINB12, FOLATE, FERRITIN, TIBC, IRON, RETICCTPCT in the last 72 hours. Sepsis Labs: No results for input(s):  PROCALCITON, LATICACIDVEN in the last 168 hours.  No results found for this or any previous visit (from the past 240 hour(s)).     Radiology Studies: No results found.    Scheduled Meds: . ALPRAZolam  0.25 mg Oral QHS  . escitalopram  10 mg Oral Daily  . fentaNYL      . iopamidol      . lidocaine (PF)      . metoprolol succinate  25 mg Oral Daily  . midazolam      . mirtazapine  7.5 mg Oral QHS  . mometasone-formoterol  2 puff Inhalation BID  . predniSONE  10 mg Oral Daily  . sodium chloride flush  3  mL Intravenous Q12H   Continuous Infusions:   LOS: 2 days    Time spent: 30 minutes   Dessa Phi, DO [facility] [email or web address] Password [email or web address] 01/04/2017, 2:52 PM

## 2017-01-04 NOTE — Sedation Documentation (Signed)
Patient is resting comfortably. Pt has been repositioned for comfort of procedure.

## 2017-01-04 NOTE — Progress Notes (Signed)
Pt selected to discharge home with HHPT/OT/RN. [facility] was called spoke with Juliann Pulse, [place], Alvis Lemmings was selected, referral was called to [facility] rep.

## 2017-01-04 NOTE — Progress Notes (Signed)
Pt returned form surgery at 1400. Asleep but arouses to voice. VSS. Denies pain or discomfort. Daughter at bedside. MD called daughter to give update. Pt flat for 1.5 hour per order. Raised HOB 30 degrees. Pt tolerated well. VSS. Pt able to order lunch. Denies pain or discomfort

## 2017-01-04 NOTE — Sedation Documentation (Signed)
Vital signs stable. 

## 2017-01-05 ENCOUNTER — Encounter: Payer: Self-pay | Admitting: Cardiology

## 2017-01-05 MED ORDER — BENZOCAINE 10 % MT GEL: Freq: Three times a day (TID) | OROMUCOSAL | 0 refills | Status: DC | PRN

## 2017-01-05 MED ORDER — APIXABAN 2.5 MG PO TABS: 2.5000 mg | ORAL_TABLET | Freq: Two times a day (BID) | ORAL | Status: DC

## 2017-01-05 MED ORDER — HYDROCODONE-ACETAMINOPHEN 5-325 MG PO TABS: 1.0000 | ORAL_TABLET | ORAL | 0 refills | Status: DC | PRN

## 2017-01-05 NOTE — Progress Notes (Signed)
Occupational Therapy Treatment Patient Details Name: [name] MRN: [id number] DOB: 1930/11/24 Today's Date: 01/05/2017    History of present illness COURTENY EGLER is a 83 y.o. female with medical history significant of A Fib on Eliquis, complete heart block s/p pacemaker, CHF, COPD, polymyalgia rheumatica on chronic steroids, HTN who presents with few week history of left leg pain, T9 compression fx. S/P KP 7/18 kyphoplasty-T9 on 01/04/17.    OT comments  Pt with improvements in mobility today. Able to perform toilet transfer ambulating to the bathroom, peri care, and grooming task standing at the sink with min guard assist. Pt requires cues throughout all functional activities to maintain back precautions. D/c plan remains appropriate. Will continue to follow acutely.   Follow Up Recommendations  SNF    Equipment Recommendations  None recommended by OT    Recommendations for Other Services      Precautions / Restrictions Precautions Precautions: Fall Precaution Comments: reviewed back precautions during functional activities Restrictions Weight Bearing Restrictions: No       Mobility Bed Mobility        General bed mobility comments: Pt OOB in chair upon arrival  Transfers Overall transfer level: Needs assistance Equipment used: Rolling walker (2 wheeled) Transfers: Sit to/from Stand Sit to Stand: Min assist         General transfer comment: min assist to boost up from chair x1, toilet x1. Cues for hand placement and sequencing    Balance Overall balance assessment: Needs assistance Sitting-balance support: Feet supported;No upper extremity supported Sitting balance-Leahy Scale: Good     Standing balance support: During functional activity;No upper extremity supported Standing balance-Leahy Scale: Fair Standing balance comment: able to stand at sink and wash hands without UE support                           ADL either performed or  assessed with clinical judgement   ADL Overall ADL's : Needs assistance/impaired     Grooming: Min guard;Standing;Wash/dry hands Grooming Details (indicate cue type and reason): cues for back precautions                 Toilet Transfer: Min guard;RW;Regular Toilet;Grab bars   Toileting- Clothing Manipulation and Hygiene: Min guard;Sit to/from stand;Set up       Functional mobility during ADLs: Min guard;Rolling walker       Vision       Perception     Praxis      Cognition Arousal/Alertness: Awake/alert Behavior During Therapy: WFL for tasks assessed/performed Overall Cognitive Status: Within Functional Limits for tasks assessed                                          Exercises     Shoulder Instructions       General Comments      Pertinent Vitals/ Pain       Pain Assessment: Faces Pain Score: 2  Faces Pain Scale: Hurts a little bit Pain Location: L calf, back Pain Descriptors / Indicators: Sore Pain Intervention(s): Monitored during session  Home Living                                          Prior Functioning/Environment  Frequency  Min 2X/week        Progress Toward Goals  OT Goals(current goals can now be found in the care plan section)  Progress towards OT goals: Progressing toward goals  Acute Rehab OT Goals Patient Stated Goal: rehab then home OT Goal Formulation: With patient  Plan Discharge plan remains appropriate    Co-evaluation                 AM-PAC PT "6 Clicks" Daily Activity     Outcome Measure   Help from another person eating meals?: None Help from another person taking care of personal grooming?: A Little Help from another person toileting, which includes using toliet, bedpan, or urinal?: A Little Help from another person bathing (including washing, rinsing, drying)?: A Lot Help from another person to put on and taking off regular upper body  clothing?: A Little Help from another person to put on and taking off regular lower body clothing?: A Lot 6 Click Score: 17    End of Session Equipment Utilized During Treatment: Rolling walker  OT Visit Diagnosis: Unsteadiness on feet (R26.81);Muscle weakness (generalized) (M62.81);Pain;History of falling (Z91.81)   Activity Tolerance Patient tolerated treatment well   Patient Left in chair;with call bell/phone within reach   Nurse Communication          Time: 7530-0511 OT Time Calculation (min): 13 min  Charges: OT General Charges $OT Visit: 1 Procedure OT Treatments $Self Care/Home Management : 8-22 mins  Rajohn Henery A. Ulice Brilliant, M.S., OTR/L Pager: [place] 01/05/2017, 12:07 PM

## 2017-01-05 NOTE — Discharge Summary (Addendum)
Physician Discharge Summary  [name] ZOX:[id number] DOB: 19-Aug-1930 DOA: 01/01/2017  PCP: Lajean Manes, MD  Admit date: 01/01/2017 Discharge date: 01/05/2017  Admitted From: Home Disposition:  ALF with home health   Recommendations for Outpatient Follow-up:  1. Follow up with PCP in 1 week 2. Follow up with IR clinic in 2 weeks   Home Health: PT OT RN aide Equipment/Devices: None    Discharge Condition: Stable CODE STATUS: Full  Diet recommendation: Heart healthy   Brief/Interim Summary: Debbie Truax Winslowis a 83 y.o.femalewith medical history significant of A Fib on Eliquis, complete heart block s/p pacemaker, CHF, COPD, polymyalgia rheumatica on chronic steroids, HTN who presents with few week history of left leg pain. She states that she was in of normal health and normal mobility when she stubbed her left great toe. Since then, she has had significant left ankle pain that has limited her mobility, and now is using a walker. She was evaluated at her PCP office to thought this may be PMR flareup. At that time, her steroid dose was doubled, however, she did not have any relief. She also admits to back pain, but thought that it was due to laying around. She denies any fevers or chills, chest pain or shortness of breath, cough, nausea, vomiting, diarrhea or abdominal pain, dysuria. Even though she was found to be hypoxemic in the emergency department, she denies any symptomatic shortness of breath or labored breathing. She currently lives alone at home, but due to her decreased mobility as well as pain, she does not feel to be at home alone. She was admitted for further evaluation of her hypoxia as well as compression fracture T9 and left ankle pain.   Acute hypoxemic respiratory failure -Secondary to acute on chronic systolic CHF  -CT chest showed improving pulmonary infiltrates from her pneumonia dx in April  -CTA chest negative for PE  -Received IV lasix, now euvolemic, wean O2  and now on room air   Compression fracture T9 -IR for kyphoplasty 7/18 -Continue supportive care   Left ankle pain -Xray without bony abnormality. Showed soft tissue swelling. Could possibly be due to ankle sprain. Cannot perform MRI due to pacemaker  -Supportive care  -PT OT   PMR -Continue chronic prednisone dose   Depression/Anxiety -Continue home lexapro, xanax, remeron   HTN -Continue toprol   Paroxysmal A Fib -CHADSVASc 3 -Continue eliquis, resume this afternoon   Complete heart block s/p pacemaker  -Continue cardiac monitoring   COPD  -Continue Dulera  -Without acute exacerbation    Discharge Instructions  Discharge Instructions    (HEART FAILURE PATIENTS) Call MD:  Anytime you have any of the following symptoms: 1) 3 pound weight gain in 24 hours or 5 pounds in 1 week 2) shortness of breath, with or without a dry hacking cough 3) swelling in the hands, feet or stomach 4) if you have to sleep on extra pillows at night in order to breathe.    Complete by:  As directed    Call MD for:  difficulty breathing, headache or visual disturbances    Complete by:  As directed    Call MD for:  extreme fatigue    Complete by:  As directed    Call MD for:  persistant dizziness or light-headedness    Complete by:  As directed    Call MD for:  persistant nausea and vomiting    Complete by:  As directed    Call MD for:  severe  uncontrolled pain    Complete by:  As directed    Call MD for:  temperature >100.4    Complete by:  As directed    Diet - low sodium heart healthy    Complete by:  As directed    Discharge instructions    Complete by:  As directed    You were cared for by a hospitalist during your hospital stay. If you have any questions about your discharge medications or the care you received while you were in the hospital after you are discharged, you can call the unit and asked to speak with the hospitalist on call if the hospitalist that took care of you  is not available. Once you are discharged, your primary care physician will handle any further medical issues. Please note that NO REFILLS for any discharge medications will be authorized once you are discharged, as it is imperative that you return to your primary care physician (or establish a relationship with a primary care physician if you do not have one) for your aftercare needs so that they can reassess your need for medications and monitor your lab values.   Increase activity slowly    Complete by:  As directed      Allergies as of 01/05/2017   No Known Allergies     Medication List    TAKE these medications   alendronate 70 MG tablet Commonly known as:  FOSAMAX Take 70 mg by mouth every Saturday. Take with a full glass of water on an empty stomach.   ALPRAZolam 0.25 MG tablet Commonly known as:  XANAX Take 0.25 mg by mouth at bedtime.   benzocaine 10 % mucosal gel Commonly known as:  ORAJEL Use as directed in the mouth or throat 3 (three) times daily as needed for mouth pain.   budesonide-formoterol 160-4.5 MCG/ACT inhaler Commonly known as:  SYMBICORT Inhale 2 puffs into the lungs 2 (two) times daily.   ELIQUIS 2.5 MG Tabs tablet Generic drug:  apixaban TAKE 1 TABLET(2.5 MG) BY MOUTH TWICE DAILY   escitalopram 10 MG tablet Commonly known as:  LEXAPRO Take 10 mg by mouth daily.   furosemide 40 MG tablet Commonly known as:  LASIX Take 40 mg by mouth daily as needed (for weight gain of greater than 2lbs).   HYDROcodone-acetaminophen 5-325 MG tablet Commonly known as:  NORCO/VICODIN Take 1-2 tablets by mouth every 4 (four) hours as needed for moderate pain.   Ipratropium-Albuterol 20-100 MCG/ACT Aers respimat Commonly known as:  COMBIVENT RESPIMAT Inhale 1 puff into the lungs every 6 (six) hours as needed for wheezing or shortness of breath.   metoprolol succinate 25 MG 24 hr tablet Commonly known as:  TOPROL-XL Take 25 mg by mouth daily.   midodrine 2.5 MG  tablet Commonly known as:  PROAMATINE Take 2.5 mg by mouth as needed (when systolic BP is lower than 100).   mirtazapine 15 MG tablet Commonly known as:  REMERON Take 7.5 mg by mouth at bedtime.   potassium chloride SA 20 MEQ tablet Commonly known as:  K-DUR,KLOR-CON Take 20 mEq by mouth daily as needed (when taking Lasix).   predniSONE 10 MG tablet Commonly known as:  DELTASONE Take 10 mg by mouth daily.      Follow-up Information    Stoneking, Hal, MD. Schedule an appointment as soon as possible for a visit in 1 week(s).   Specialty:  Internal Medicine Contact information: [address] [place] [address] [phone]  Jacqulynn Cadet, MD. Schedule an appointment as soon as possible for a visit in 2 week(s).   Specialties:  Interventional Radiology, Radiology Why:  Follow up kyphoplasty  Contact information: [address] [place] [address] [phone]          No Known Allergies  Consultations:  IR   Procedures/Studies: Dg Knee 1-2 Views Left  Result Date: 12/28/2016 CLINICAL DATA:  Pt here c/o left hip AND knee pain x 1 week. Left hip hurts when getting up. Left knee hurts all over in different areas at different time that radiates to ankle. No hx of injury. EXAM: LEFT KNEE - 1-2 VIEW COMPARISON:  None. FINDINGS: No fracture or bone lesion. Mild to moderate medial joint space compartment narrowing. There are small marginal osteophytes from the medial and lateral compartments. Chondrocalcinosis is noted along the menisci. Small joint effusion.  Soft tissues otherwise unremarkable. Bones are demineralized. IMPRESSION: 1. No fracture or bone lesion. 2. Osteoarthritis with a small joint effusion. Electronically Signed   By: Lajean Manes M.D.   On: 12/28/2016 11:05   Dg Ankle Complete Left  Result Date: 01/01/2017 CLINICAL DATA:  Left ankle pain since hitting her foot on something 3 weeks ago. EXAM: LEFT ANKLE COMPLETE  - 3+ VIEW COMPARISON:  None. FINDINGS: Mild-to-moderate diffuse lateral soft tissue swelling. Mild talofibular spur formation. No fracture, dislocation or effusion seen. IMPRESSION: No fracture. Electronically Signed   By: Claudie Revering M.D.   On: 01/01/2017 11:46   Ct Chest Wo Contrast  Result Date: 01/01/2017 CLINICAL DATA:  Severe LEFT leg pain, low back and upper back pain, progressive pain over 3 days, cough and congestion, history pneumonia, benign essential hypertension, COPD, dermatomyositis, atrial fibrillation, CHF, polymyalgia rheumatica, former smoker EXAM: CT CHEST WITHOUT CONTRAST TECHNIQUE: Multidetector CT imaging of the chest was performed following the standard protocol without IV contrast. Sagittal and coronal MPR images reconstructed from axial data set. Respiratory motion artifacts degrade exam. COMPARISON:  CT chest 10/05/2016 FINDINGS: Cardiovascular: Atherosclerotic calcifications aorta, proximal great vessels and coronary arteries. Cardiac chambers appear mildly dilated. No pericardial effusion. Sequential pacemaker leads via RIGHT subclavian approach at RIGHT ventricle and RIGHT atrium. Mediastinum/Nodes: Scattered normal sized mediastinal lymph nodes. No thoracic adenopathy. Esophagus unremarkable. Base of cervical region normal appearance Lungs/Pleura: Small BILATERAL pleural effusions. Hyperinflated lungs with scattered peribronchial thickening and diffuse interseptal thickening unchanged. Subsegmental atelectasis in BILATERAL lower lobes, lingula, and RIGHT middle lobe. Calcified granuloma RIGHT middle lobe. Scattered tiny nodules in periphery of RIGHT lung, some stable, additional potentially obscured by infiltrates on previous exam. No definite enlarging pulmonary mass or nodule. No segmental consolidation or pneumothorax. Upper Abdomen: Multiple hepatic cysts, better characterized on previous exam which last motion artifacts, grossly stable in appearance. Probable peripelvic cyst  upper pole LEFT kidney. Remaining visualized upper abdomen unremarkable. Musculoskeletal: Sclerosis at lateral RIGHT third fourth fifth sixth and seventh ribs from healing fractures. Diffuse osseous demineralization. Interval acute compression fracture of T9 vertebral body with 25% anterior height loss. IMPRESSION: Improved pulmonary infiltrates particularly in RIGHT lower lobe and RIGHT middle lobe since previous exam. Scattered subsegmental atelectasis and bronchitic changes with small BILATERAL pleural effusions. Hepatic cysts. Osseous demineralization with interval compression fracture of T9 vertebral body with approximately 25% anterior height loss. Tiny peripheral nonspecific nodular foci RIGHT lung, recommendation below. No follow-up needed if patient is low-risk (and has no known or suspected primary neoplasm). Non-contrast chest CT can be considered in 12 months if patient is high-risk. This  recommendation follows the consensus statement: Guidelines for Management of Incidental Pulmonary Nodules Detected on CT Images: From the Fleischner Society 2017; Radiology 2017; 284:228-243. Aortic Atherosclerosis (ICD10-I70.0) and Emphysema (ICD10-J43.9). Electronically Signed   By: Lavonia Dana M.D.   On: 01/01/2017 12:12   Ct Angio Chest Pe W Or Wo Contrast  Result Date: 01/02/2017 CLINICAL DATA:  83 year old female with shortness of breath and cough. Elevated D-dimer. EXAM: CT ANGIOGRAPHY CHEST WITH CONTRAST TECHNIQUE: Multidetector CT imaging of the chest was performed using the standard protocol during bolus administration of intravenous contrast. Multiplanar CT image reconstructions and MIPs were obtained to evaluate the vascular anatomy. CONTRAST:  100 cc Isovue 370 COMPARISON:  Chest CT dated 01/01/2017 FINDINGS: Cardiovascular: There is stable moderate cardiomegaly. Multi vessel coronary vascular calcification primarily involving the LAD, left circumflex artery noted. There is no pericardial effusion.  Right pectoral pacemaker device noted. There is moderate atherosclerotic calcification of the thoracic aorta. The aorta is otherwise unremarkable for the degree of enhancement. There is no CT evidence of pulmonary embolism. Mediastinum/Nodes: No hilar adenopathy. Stable appearing subcarinal lymph node measuring 8 mm in short axis. Small AP window lymph nodes appear similar to prior CT. Esophagus is grossly unremarkable. The thyroid gland is heterogeneous. Lungs/Pleura: There small bilateral pleural effusions, slightly increased compared to the prior CT. There is associated subsegmental atelectasis of the lung bases. Bibasilar linear scarring noted. There is mild diffuse interlobular septal prominence most consistent with edema. Stable focal area of consolidative change in the lingula likely represents atelectasis/scarring. Stable right upper lobe linear atelectasis/ scarring noted. There is no pneumothorax. The central airways are patent. Upper Abdomen: Multiple scattered hepatic hypodense lesions, incompletely characterized but demonstrate fluid attenuation most consistent with cysts. There is passive flow of contrast from the right atrium into the IVC consistent with a degree of right cardiac dysfunction. Musculoskeletal: Osteopenia with degenerative changes of the spine. Old healed right rib fractures. Stable T9 compression fracture. No acute fracture. Review of the MIP images confirms the above findings. IMPRESSION: 1. No CT evidence of pulmonary embolism. 2. Cardiomegaly with mild interstitial edema. 3. Small bilateral pleural effusions, similar or slightly increased compared to prior CT. 4. Bibasilar subsegmental atelectasis versus infiltrate. Linear bibasilar atelectasis/scarring. Focal consolidative change in the lingula appears similar to the prior CT. 5.  Aortic Atherosclerosis (ICD10-I70.0). Electronically Signed   By: Anner Crete M.D.   On: 01/02/2017 04:48   Ct Lumbar Spine Wo Contrast  Result  Date: 01/01/2017 CLINICAL DATA:  Severe LEFT leg pain, and low back pain. Chronic but acutely progressive. EXAM: CT LUMBAR SPINE WITHOUT CONTRAST TECHNIQUE: Multidetector CT imaging of the lumbar spine was performed without intravenous contrast administration. Multiplanar CT image reconstructions were also generated. COMPARISON:  CT lumbar spine 05/24/2005. FINDINGS: Segmentation: Standard Alignment: Normal. Vertebrae: No acute fracture or focal pathologic process. Incompletely visualized Tarlov cyst at S2 appears to result in bony remodeling. Paraspinal and other soft tissues: Aortic atherosclerosis. No masses. Disc levels: L1-L2: Fairly good preservation of disc height. Osseous spurring. No definite impingement. L2-L3: Fairly good preservation of disc height. Osseous spurring facet arthropathy. Broad-based disc protrusion. LEFT subarticular zone and foraminal zone narrowing. LEFT L2 and LEFT L3 nerve root impingement. Mild stenosis. L3-L4: Disc space narrowing, worse on the LEFT. Osseous spurring. Broad-based disc protrusion central and to the LEFT, partially calcified. Facet arthropathy. Moderate to severe stenosis. BILATERAL LEFT greater than RIGHT L3 and L4 nerve root impingement. L4-L5: Disc space narrowing, worse on the RIGHT. Osseous spurring. Facet  arthropathy. Central disc protrusion, partially calcified, eccentric to the RIGHT. Severe stenosis. RIGHT greater than LEFT L4 and L5 nerve root impingement. L5-S1: Disc space narrowing. Central protrusion. Posterior element hypertrophy. Mild stenosis. RIGHT greater than LEFT L5 and S1 nerve root impingement. Compared with study 12 years ago, there is progression of disc disease at all levels. IMPRESSION: Advanced multilevel spondylosis. Potentially symptomatic neural impingement at multiple levels; LEFT-sided predominant at L2-3 and L3-4; see comments above. No acute compression fracture or traumatic subluxation. Electronically Signed   By: Staci Righter M.D.    On: 01/01/2017 12:10   Dg Foot Complete Left  Result Date: 01/01/2017 CLINICAL DATA:  Left foot pain since hitting her foot on something 3 weeks ago. EXAM: LEFT FOOT - COMPLETE 3+ VIEW COMPARISON:  Left ankle radiographs obtained at the same time. FINDINGS: Hallux valgus. Calcifications medial to the first metatarsal head and in the plantar soft tissues of the proximal foot. No fracture or dislocation seen. Minimal inferior calcaneal spur formation. IMPRESSION: No fracture. Electronically Signed   By: Claudie Revering M.D.   On: 01/01/2017 11:48   Ir Kypho Thoracic With Bone Biopsy  Result Date: 01/04/2017 CLINICAL DATA:  83 year old female with acute symptomatic T9 compression fracture. She presents for cement augmentation. EXAM: FLUOROSCOPIC GUIDED KYPHOPLASTY OF THE T9 VERTEBRAL BODY COMPARISON:  CT scan of the chest 01/02/2017 MEDICATIONS: As antibiotic prophylaxis, 2 g Ancef was ordered pre-procedure and administered intravenously within 1 hour of incision. ANESTHESIA/SEDATION: Moderate (conscious) sedation was employed during this procedure. A total of Versed 5.5 mg and Fentanyl 75 mcg was administered intravenously. Moderate Sedation Time: 42 Minutes. The patient's level of consciousness and vital signs were monitored continuously by radiology nursing throughout the procedure under my direct supervision. FLUOROSCOPY TIME:  10 minutes, 42 seconds (601 mGy) COMPLICATIONS: None immediate. TECHNIQUE: The procedure, risks (including but not limited to bleeding, infection, organ damage), benefits, and alternatives were explained to the patient. Questions regarding the procedure were encouraged and answered. The patient understands and consents to the procedure. The patient was placed prone on the fluoroscopic table. The skin overlying the upper thoracic region was then prepped and draped in the usual sterile fashion. Maximal barrier sterile technique was utilized including caps, mask, sterile gowns, sterile  gloves, sterile drape, hand hygiene and skin antiseptic. Intravenous Fentanyl and Versed were administered as conscious sedation during continuous cardiorespiratory monitoring by the radiology RN. The left pedicle at T9 was then infiltrated with 1% lidocaine followed by the advancement of a Kyphon trocar needle through the left pedicle into the posterior one-third of the vertebral body. Subsequently, the osteo drill was advanced to the anterior third of the vertebral body. The osteo drill was retracted. Through the working cannula, a Kyphon inflatable bone tamp 15 x 2 was advanced and positioned with the distal marker approximately 5 mm from the anterior aspect of the cortex. Appropriate positioning was confirmed on the AP projection. At this time, the balloon was expanded using contrast via a Kyphon inflation syringe device via micro tubing. Inflations were continued until there was near apposition with the superior end plate. At this time, methylmethacrylate mixture was reconstituted in the Kyphon bone mixing device system. This was then loaded into the delivery mechanism, attached to Kyphon bone fillers. The balloons were deflated and removed followed by the instillation of methylmethacrylate mixture with excellent filling in the AP and lateral projections. No extravasation was noted in the disk spaces or posteriorly into the spinal canal. No epidural venous contamination  was seen. The working cannulae and the bone filler were then retrieved and removed. Hemostasis was achieved with manual compression. The patient tolerated the procedure well without immediate postprocedural complication. IMPRESSION: 1. Technically successful T9 vertebral body augmentation using balloon kyphoplasty. 2. Per CMS PQRS reporting requirements (PQRS Measure 24): Given the patient's age of greater than 62 and the fracture site (hip, distal radius, or spine), the patient should be tested for osteoporosis using DXA, and the appropriate  treatment considered based on the DXA results. Signed, Criselda Peaches, MD [facility] Electronically Signed   By: Jacqulynn Cadet M.D.   On: 01/04/2017 16:41   Dg Hip Unilat With Pelvis 2-3 Views Left  Result Date: 12/28/2016 CLINICAL DATA:  Left hip and knee pain for 1 week.  No known injury. EXAM: DG HIP (WITH OR WITHOUT PELVIS) 2-3V LEFT COMPARISON:  None. FINDINGS: No acute bony or joint abnormality is identified. The patient has moderate to moderately severe osteoarthritis about the left hip with subchondral sclerosis and osteophytosis identified. No focal bony lesion. Surrounding soft tissue structures are unremarkable. IMPRESSION: No acute finding. Moderate to moderately severe left hip osteoarthritis. Electronically Signed   By: Inge Rise M.D.   On: 12/28/2016 11:04       Discharge Exam: Vitals:   01/04/17 2031 01/05/17 0452  BP: (!) 114/49 (!) 166/63  Pulse: 72 95  Resp: 14 12  Temp: 98.3 F (36.8 C) 97.9 F (36.6 C)   Vitals:   01/04/17 1948 01/04/17 2031 01/05/17 0452 01/05/17 0828  BP:  (!) 114/49 (!) 166/63   Pulse:  72 95   Resp:  14 12   Temp:  98.3 F (36.8 C) 97.9 F (36.6 C)   TempSrc:  Oral Oral   SpO2: 92% 93% 95% 97%  Weight:      Height:        General: Pt is alert, awake, not in acute distress Cardiovascular: S1/S2 +, no rubs, no gallops Respiratory: CTA bilaterally, no wheezing, no rhonchi Abdominal: Soft, NT, ND, bowel sounds + Extremities: no edema, no cyanosis    The results of significant diagnostics from this hospitalization (including imaging, microbiology, ancillary and laboratory) are listed below for reference.     Microbiology: No results found for this or any previous visit (from the past 240 hour(s)).   Labs: BNP (last 3 results)  Recent Labs  07/15/16 0957 10/05/16 0930 10/08/16 1741  BNP 536.6* 544.3* 329.9*   Basic Metabolic Panel:  Recent  Labs Lab 01/01/17 1155 01/02/17 0545 01/03/17 0442 01/04/17 0420  NA 143 138 137 139  K 3.6 3.8 3.4* 4.3  CL 105 101 96* 99*  CO2 29 29 32 33*  GLUCOSE 89 90 159* 116*  BUN 18 13 16  26*  CREATININE 0.78 0.73 0.90 1.01*  CALCIUM 8.7* 8.3* 8.7* 8.9   Liver Function Tests:  Recent Labs Lab 01/01/17 1155  AST 22  ALT 15  ALKPHOS 84  BILITOT 1.3*  PROT 6.9  ALBUMIN 3.6   No results for input(s): LIPASE, AMYLASE in the last 168 hours. No results for input(s): AMMONIA in the last 168 hours. CBC:  Recent Labs Lab 01/01/17 1155 01/02/17 0545 01/03/17 0442  WBC 9.6 7.3 10.2  NEUTROABS 6.9  --   --   HGB 13.0 11.9* 12.4  HCT 40.6 38.0 39.1  MCV 90.8 90.3 89.3  PLT 292 266 286   Cardiac Enzymes: No results for input(s): CKTOTAL, CKMB, CKMBINDEX, TROPONINI  in the last 168 hours. BNP: Invalid input(s): POCBNP CBG: No results for input(s): GLUCAP in the last 168 hours. D-Dimer No results for input(s): DDIMER in the last 72 hours. Hgb A1c No results for input(s): HGBA1C in the last 72 hours. Lipid Profile No results for input(s): CHOL, HDL, LDLCALC, TRIG, CHOLHDL, LDLDIRECT in the last 72 hours. Thyroid function studies No results for input(s): TSH, T4TOTAL, T3FREE, THYROIDAB in the last 72 hours.  Invalid input(s): FREET3 Anemia work up No results for input(s): VITAMINB12, FOLATE, FERRITIN, TIBC, IRON, RETICCTPCT in the last 72 hours. Urinalysis    Component Value Date/Time   COLORURINE YELLOW 10/11/2016 0033   APPEARANCEUR CLEAR 10/11/2016 0033   LABSPEC 1.016 10/11/2016 0033   PHURINE 6.0 10/11/2016 0033   GLUCOSEU NEGATIVE 10/11/2016 0033   HGBUR SMALL (A) 10/11/2016 0033   BILIRUBINUR NEGATIVE 10/11/2016 0033   KETONESUR NEGATIVE 10/11/2016 0033   PROTEINUR NEGATIVE 10/11/2016 0033   UROBILINOGEN 1.0 01/19/2011 1030   NITRITE NEGATIVE 10/11/2016 0033   LEUKOCYTESUR NEGATIVE 10/11/2016 0033   Sepsis Labs Invalid input(s): PROCALCITONIN,  WBC,   LACTICIDVEN Microbiology No results found for this or any previous visit (from the past 240 hour(s)).   Time coordinating discharge: 45 minutes  SIGNED:  Dessa Phi, DO [facility] Pager [phone]  If 7PM-7AM, please contact night-coverage [email or web address] Password [facility] 01/05/2017, 10:36 AM

## 2017-01-05 NOTE — Progress Notes (Signed)
Physical Therapy Treatment Patient Details Name: [name] MRN: [id number] DOB: 02/03/1931 Today's Date: 01/05/2017    History of Present Illness [name] is a 83 y.o. female with medical history significant of A Fib on Eliquis, complete heart block s/p pacemaker, CHF, COPD, polymyalgia rheumatica on chronic steroids, HTN who presents with few week history of left leg pain, T9 compression fx. S/P KP 7/18 kyphoplasty-T9 on 01/04/17.     PT Comments    Daughter present during session. There was some confusion as to what PT recommendations were on eval (recommendation was for SNF) and d/c plan. Per daughter, plan has been, and is, for pt to go to an ALF (short term and private pay) before returning to her home where she lives alone. Pt is s/p kyphoplasty on 7/18 and PT was asked to see pt again before possible d/c home today. Pt currently requires Min assist for mobility tasks. She presents with general weakness, decreased activity tolerance, and impaired gait and balance. She will likely require assist for all ADLs and wheelchair transport to dining room until pt is able to safely tolerate long distance ambulation. Explained to daughter and pt that ALF needs to be agreeable to providing current level of assist. If ALF is unable to provide current level of assist, pt may need ST SNF rehab.    Follow Up Recommendations  Home health PT;Supervision/Assistance - 24 hour (at ALF as long as facility can provide current level of care. See above comments.)     Equipment Recommendations       Recommendations for Other Services       Precautions / Restrictions Precautions Precautions: Fall Precaution Comments: logroll and back precautions for comfort Restrictions Weight Bearing Restrictions: No    Mobility  Bed Mobility Overal bed mobility: Needs Assistance Bed Mobility: Rolling;Sidelying to Sit Rolling: Min guard Sidelying to sit: Min assist       General bed mobility comments:  Logroll technique for comfort. Small amount of assist for trunk to upright. Increased time.   Transfers Overall transfer level: Needs assistance Equipment used: Rolling walker (2 wheeled) Transfers: Sit to/from Stand Sit to Stand: Min assist         General transfer comment: VCs safety, technique, hand/LE placement. Small amount of assist to rise to standing. Increased time.   Ambulation/Gait Ambulation/Gait assistance: Min assist Ambulation Distance (Feet): 60 Feet Assistive device: Rolling walker (2 wheeled) Gait Pattern/deviations: Step-through pattern;Decreased stride length;Antalgic     General Gait Details: Antalgic gait pattern L LE. Intermittent assist to steady. VCs safety, technique, sequence. Slow gait speed. Pt fatigues easily.    Stairs            Wheelchair Mobility    Modified Rankin (Stroke Patients Only)       Balance Overall balance assessment: Needs assistance         Standing balance support: Bilateral upper extremity supported Standing balance-Leahy Scale: Poor Standing balance comment: requiring RW at this time                            Cognition Arousal/Alertness: Awake/alert Behavior During Therapy: WFL for tasks assessed/performed Overall Cognitive Status: Within Functional Limits for tasks assessed                                        Exercises  General Comments        Pertinent Vitals/Pain Pain Assessment: 0-10 Pain Score: 2  Pain Location: L calf, back Pain Descriptors / Indicators: Tender;Sore Pain Intervention(s): Monitored during session;Repositioned    Home Living                      Prior Function            PT Goals (current goals can now be found in the care plan section) Progress towards PT goals: Progressing toward goals    Frequency    Min 3X/week      PT Plan Current plan remains appropriate    Co-evaluation              AM-PAC PT "6  Clicks" Daily Activity  Outcome Measure  Difficulty turning over in bed (including adjusting bedclothes, sheets and blankets)?: A Lot Difficulty moving from lying on back to sitting on the side of the bed? : Total Difficulty sitting down on and standing up from a chair with arms (e.g., wheelchair, bedside commode, etc,.)?: Total Help needed moving to and from a bed to chair (including a wheelchair)?: A Little Help needed walking in hospital room?: A Little Help needed climbing 3-5 steps with a railing? : A Lot 6 Click Score: 12    End of Session Equipment Utilized During Treatment: Gait belt Activity Tolerance: Patient limited by fatigue Patient left: in chair;with call bell/phone within reach;with family/visitor present   PT Visit Diagnosis: Muscle weakness (generalized) (M62.81);Difficulty in walking, not elsewhere classified (R26.2)     Time: 7741-2878 PT Time Calculation (min) (ACUTE ONLY): 33 min  Charges:  $Gait Training: 8-22 mins $Therapeutic Activity: 8-22 mins                    G Codes:          Weston Anna, MPT Pager: [phone]

## 2017-01-05 NOTE — Consult Note (Addendum)
   [facility] CM Inpatient Consult   01/05/2017  [name] [birth year] [id number]    Screened for [facility] Care Management program due multiple hospitalizations.   Chart reviewed and noted discharge plan is for ALF.  Went to room to speak with [name] prior to hospital discharge to discuss potential [facility] Care Management follow up.   Mrs. Cue indicates "my daugher takes care of all of that. Leave your information and she can call you if she thinks your services are needed".  Provided [facility] Care Management brochure, 24-hr nurse line magnet, and contact information at bedside for daughter's review, per patient request.    Marthenia Rolling, MSN-Ed, RN,BSN [facility] Liaison [phone]

## 2017-01-05 NOTE — Progress Notes (Addendum)
LCSW following for discharge plan:  Patient will return to [facility] ALF at discharge, being today. LCSW confirmed with Juliann Pulse at facility who is in agreement,  All medical documents sent through [place] feature through [place] Updated FL2 and MD to sign.  Call placed to RN to understand transport as patient's daughter at the bedside and aware of plans for discharge. RN in huddle, unable to discuss case, but will call back in the next few minutes to complete discharge.   Call placed to Kaylyn Layer (daughter) who confirms plans and will transport patient to facility after 3:30pm.    No other needs for SW at this time.  Will sign off.  Lane Hacker, MSW Clinical Social Work: [employer]maker Coverage for :  [phone]

## 2017-01-05 NOTE — NC FL2 (Signed)
[place] LEVEL OF CARE SCREENING TOOL     IDENTIFICATION  Patient Name: [name] Birthdate: 08/16/1930 Sex: female Admission Date (Current Location): 01/01/2017  [facility] and [place] Number:  Herbalist and Address:  [facility],  [place] [address] [place], [place]      Provider Number: [id number]  Attending Physician Name and Address:  Dessa Phi Chahn-Yan*  Relative Name and Phone Number:       Current Level of Care: Hospital Recommended Level of Care: [address] Prior Approval Number:    Date Approved/Denied:   PASRR Number:    Discharge Plan: Other (Comment) (Return to ALF: [facility])    Current Diagnoses: Patient Active Problem List   Diagnosis Date Noted  . Non-traumatic compression fracture of T9 thoracic vertebra ([place]) 01/02/2017  . Polymyalgia rheumatica ([place])   . Postural dizziness with near syncope   . Essential hypertension 10/11/2016  . Syncope 10/11/2016  . Postural dizziness with presyncope 10/11/2016  . Acute hypoxemic respiratory failure ([place]) 10/05/2016  . Chronic diastolic CHF (congestive heart failure) ([place]) 10/05/2016  . Smoker 10/05/2016  . Closed multiple fractures of right upper extremity with ribs with routine healing 10/05/2016  . HCAP (healthcare-associated pneumonia) 10/31/2015  . COPD (chronic obstructive pulmonary disease) ([place]) 10/31/2015  . Leukocytosis 10/31/2015  . Benign essential HTN 10/31/2015  . Anxiety state   . Acute respiratory failure with hypoxia ([place]) 08/26/2015  . COPD exacerbation ([place]) 08/26/2015  . Diastolic CHF ([place]) 17/51/0258  . Systemic hypertension 08/26/2015  . Hypokalemia 08/26/2015  . Anticoagulant long-term use 08/26/2015  . DM (dermatomyositis) 08/26/2015  . Acute on chronic diastolic CHF (congestive heart failure), NYHA class 1 ([place])   . Sepsis ([place]) 06/07/2015  . CAP (community acquired pneumonia) 06/07/2015  . Acute CHF (congestive heart  failure) ([place]) 06/07/2015  . Paroxysmal atrial fibrillation ([place]) 09/04/2014  . Pacemaker 07/13/2013  . CHB (complete heart block) ([place]) 07/13/2013  . SSS (sick sinus syndrome) ([place]) 07/13/2013  . Orthostatic hypotension 07/13/2013  . Aortic insufficiency 07/13/2013    Orientation RESPIRATION BLADDER Height & Weight     Self, Time, Situation, Place  Normal Continent Weight: 141 lb 8.6 oz (64.2 kg) Height:  5\' 5"  (165.1 cm)  BEHAVIORAL SYMPTOMS/MOOD NEUROLOGICAL BOWEL NUTRITION STATUS      Continent Diet (regular)  AMBULATORY STATUS COMMUNICATION OF NEEDS Skin   Limited Assist Verbally Normal                       Personal Care Assistance Level of Assistance  Bathing, Feeding, Dressing Bathing Assistance: Limited assistance Feeding assistance: Independent Dressing Assistance: Limited assistance     Functional Limitations Info  Sight, Hearing, Speech Sight Info: Adequate Hearing Info: Adequate Speech Info: Adequate    SPECIAL CARE FACTORS FREQUENCY                       Contractures Contractures Info: Not present    Additional Factors Info  Code Status, Allergies Code Status Info: Full Code Allergies Info: NKA           Current Medications (01/05/2017):  This is the current hospital active medication list Current Facility-Administered Medications  Medication Dose Route Frequency Provider Last Rate Last Dose  . acetaminophen (TYLENOL) tablet 650 mg  650 mg Oral Q6H PRN Dessa Phi Chahn-Yang, DO   650 mg at 01/05/17 5277   Or  . acetaminophen (TYLENOL) suppository 650 mg  650 mg Rectal Q6H PRN Dessa Phi Chahn-Yang, DO      . ALPRAZolam Duanne Moron) tablet 0.25 mg  0.25 mg Oral QHS Dessa Phi Chahn-Yang, DO   0.25 mg at 01/04/17 2158  . apixaban (ELIQUIS) tablet 2.5 mg  2.5 mg Oral BID Dessa Phi Chahn-Yang, DO      . benzocaine (ORAJEL) 10 % mucosal gel   Mouth/Throat TID PRN Domenic Polite, MD      . escitalopram [place]a) tablet 10 mg  10  mg Oral Daily Dessa Phi Chahn-Yang, DO   10 mg at 01/05/17 0902  . HYDROcodone-acetaminophen (NORCO/VICODIN) 5-325 MG per tablet 1-2 tablet  1-2 tablet Oral Q4H PRN Dessa Phi Chahn-Yang, DO   2 tablet at 01/04/17 1847  . ipratropium-albuterol (DUONEB) 0.5-2.5 (3) MG/3ML nebulizer solution 3 mL  3 mL Nebulization Q6H PRN Dessa Phi Chahn-Yang, DO      . ketorolac (TORADOL) 15 MG/ML injection 15 mg  15 mg Intravenous Q6H PRN Dessa Phi Chahn-Yang, DO   15 mg at 01/04/17 2232  . metoprolol succinate (TOPROL-XL) 24 hr tablet 25 mg  25 mg Oral Daily Dessa Phi Chahn-Yang, DO   25 mg at 01/05/17 3419  . mirtazapine (REMERON) tablet 7.5 mg  7.5 mg Oral QHS Dessa Phi Chahn-Yang, DO   7.5 mg at 01/04/17 2158  . mometasone-formoterol (DULERA) 200-5 MCG/ACT inhaler 2 puff  2 puff Inhalation BID Dessa Phi Chahn-Yang, DO   2 puff at 01/05/17 6222  . ondansetron (ZOFRAN) tablet 4 mg  4 mg Oral Q6H PRN Dessa Phi Chahn-Yang, DO       Or  . ondansetron [facility]) injection 4 mg  4 mg Intravenous Q6H PRN Dessa Phi Chahn-Yang, DO      . predniSONE (DELTASONE) tablet 10 mg  10 mg Oral Daily Dessa Phi Chahn-Yang, DO   10 mg at 01/05/17 9798  . sodium chloride flush (NS) 0.9 % injection 3 mL  3 mL Intravenous Q12H Dessa Phi Chahn-Yang, DO   3 mL at 01/05/17 0902     Discharge Medications: TAKE these medications   alendronate 70 MG tablet Commonly known as:  FOSAMAX Take 70 mg by mouth every Saturday. Take with a full glass of water on an empty stomach.   ALPRAZolam 0.25 MG tablet Commonly known as:  XANAX Take 0.25 mg by mouth at bedtime.   benzocaine 10 % mucosal gel Commonly known as:  ORAJEL Use as directed in the mouth or throat 3 (three) times daily as needed for mouth pain.   budesonide-formoterol 160-4.5 MCG/ACT inhaler Commonly known as:  SYMBICORT Inhale 2 puffs into the lungs 2 (two) times daily.   ELIQUIS 2.5 MG Tabs tablet Generic drug:   apixaban TAKE 1 TABLET(2.5 MG) BY MOUTH TWICE DAILY   escitalopram 10 MG tablet Commonly known as:  LEXAPRO Take 10 mg by mouth daily.   furosemide 40 MG tablet Commonly known as:  LASIX Take 40 mg by mouth daily as needed (for weight gain of greater than 2lbs).   HYDROcodone-acetaminophen 5-325 MG tablet Commonly known as:  NORCO/VICODIN Take 1-2 tablets by mouth every 4 (four) hours as needed for moderate pain.   Ipratropium-Albuterol 20-100 MCG/ACT Aers respimat Commonly known as:  COMBIVENT RESPIMAT Inhale 1 puff into the lungs every 6 (six) hours as needed for wheezing or shortness of breath.   metoprolol succinate 25 MG 24 hr tablet Commonly known as:  TOPROL-XL Take 25 mg by mouth daily.   midodrine 2.5 MG tablet Commonly known  as:  PROAMATINE Take 2.5 mg by mouth as needed (when systolic BP is lower than 100).   mirtazapine 15 MG tablet Commonly known as:  REMERON Take 7.5 mg by mouth at bedtime.   potassium chloride SA 20 MEQ tablet Commonly known as:  K-DUR,KLOR-CON Take 20 mEq by mouth daily as needed (when taking Lasix).   predniSONE 10 MG tablet Commonly known as:  DELTASONE Take 10 mg by mouth daily.     Relevant Imaging Results:  Relevant Lab Results:   Additional Information SSN [id number]. Needs Home Health PT at facility: [facility], Evie Lacks,

## 2017-01-07 ENCOUNTER — Emergency Department (HOSPITAL_COMMUNITY)
Admission: EM | Admit: 2017-01-07 | Discharge: 2017-01-07 | Disposition: A | Discharge status: Home / Self Care | Payer: Medicare Other | Attending: Emergency Medicine | Admitting: Emergency Medicine

## 2017-01-07 ENCOUNTER — Encounter (HOSPITAL_COMMUNITY): Payer: Self-pay

## 2017-01-07 DIAGNOSIS — S51812D Laceration without foreign body of left forearm, subsequent encounter: Secondary | ICD-10-CM | POA: Diagnosis not present

## 2017-01-07 DIAGNOSIS — E119 Type 2 diabetes mellitus without complications: Secondary | ICD-10-CM | POA: Diagnosis not present

## 2017-01-07 DIAGNOSIS — Z87891 Personal history of nicotine dependence: Secondary | ICD-10-CM | POA: Diagnosis not present

## 2017-01-07 DIAGNOSIS — J449 Chronic obstructive pulmonary disease, unspecified: Secondary | ICD-10-CM | POA: Diagnosis not present

## 2017-01-07 DIAGNOSIS — W269XXA Contact with unspecified sharp object(s), initial encounter: Secondary | ICD-10-CM | POA: Diagnosis not present

## 2017-01-07 DIAGNOSIS — Y92122 Bedroom in nursing home as the place of occurrence of the external cause: Secondary | ICD-10-CM | POA: Insufficient documentation

## 2017-01-07 DIAGNOSIS — R52 Pain, unspecified: Secondary | ICD-10-CM | POA: Diagnosis not present

## 2017-01-07 DIAGNOSIS — I110 Hypertensive heart disease with heart failure: Secondary | ICD-10-CM | POA: Diagnosis not present

## 2017-01-07 DIAGNOSIS — I5032 Chronic diastolic (congestive) heart failure: Secondary | ICD-10-CM | POA: Diagnosis not present

## 2017-01-07 DIAGNOSIS — S90921D Unspecified superficial injury of right foot, subsequent encounter: Secondary | ICD-10-CM | POA: Diagnosis not present

## 2017-01-07 DIAGNOSIS — I480 Paroxysmal atrial fibrillation: Secondary | ICD-10-CM | POA: Diagnosis not present

## 2017-01-07 DIAGNOSIS — Z79899 Other long term (current) drug therapy: Secondary | ICD-10-CM | POA: Diagnosis not present

## 2017-01-07 DIAGNOSIS — Y999 Unspecified external cause status: Secondary | ICD-10-CM | POA: Insufficient documentation

## 2017-01-07 DIAGNOSIS — Z950 Presence of cardiac pacemaker: Secondary | ICD-10-CM | POA: Insufficient documentation

## 2017-01-07 DIAGNOSIS — S22070D Wedge compression fracture of T9-T10 vertebra, subsequent encounter for fracture with routine healing: Secondary | ICD-10-CM | POA: Diagnosis not present

## 2017-01-07 DIAGNOSIS — F419 Anxiety disorder, unspecified: Secondary | ICD-10-CM | POA: Diagnosis not present

## 2017-01-07 DIAGNOSIS — Z9181 History of falling: Secondary | ICD-10-CM | POA: Diagnosis not present

## 2017-01-07 DIAGNOSIS — Y9384 Activity, sleeping: Secondary | ICD-10-CM | POA: Diagnosis not present

## 2017-01-07 DIAGNOSIS — S59912D Unspecified injury of left forearm, subsequent encounter: Secondary | ICD-10-CM | POA: Diagnosis present

## 2017-01-07 DIAGNOSIS — M353 Polymyalgia rheumatica: Secondary | ICD-10-CM | POA: Diagnosis not present

## 2017-01-07 DIAGNOSIS — I509 Heart failure, unspecified: Secondary | ICD-10-CM | POA: Diagnosis not present

## 2017-01-07 DIAGNOSIS — F329 Major depressive disorder, single episode, unspecified: Secondary | ICD-10-CM | POA: Diagnosis not present

## 2017-01-07 DIAGNOSIS — M79603 Pain in arm, unspecified: Secondary | ICD-10-CM | POA: Diagnosis not present

## 2017-01-07 DIAGNOSIS — S50912D Unspecified superficial injury of left forearm, subsequent encounter: Secondary | ICD-10-CM | POA: Diagnosis not present

## 2017-01-07 NOTE — ED Triage Notes (Signed)
Pt is from [facility] assisted living temporarily d/t recent back surgery. Woke up today w/skin tear to LFA.  Denies injury but is on coumadin.  EMS reports slight old blood on sheets but otherwise none.  VSS: 118/68, 70, 18, 95%RA.  LS clear.

## 2017-01-07 NOTE — Discharge Instructions (Signed)
Return here as needed Follow up with your primary doctor. The steri-strips will come off on there own.

## 2017-01-07 NOTE — ED Provider Notes (Signed)
[facility] DEPT Provider Note   CSN: [id number] Arrival date & time: 01/07/17  7902     History   Chief Complaint Chief Complaint  Patient presents with  . skin tear    HPI [name] is a 83 y.o. female.  HPI Patient presents to the emergency department with a skin tear to the left forearm.  Patient states that she is unsure of constant.  She states she woke up this morning with bleeding to her left forearm.  Patient states that she must have scratched her arm while asleep.  Patient states this is happened in the past.  Patient reports no falls or other injuries.  Patient denies numbness, weakness, headache, blurred vision, or syncope Past Medical History:  Diagnosis Date  . CHB (complete heart block) ([place]) 07/13/2013   Pacemaker dependent  . CHF (congestive heart failure) ([place])   . COPD (chronic obstructive pulmonary disease) ([place])   . DM (dermatomyositis)   . Orthostatic hypotension   . Pacemaker 07/13/2013   Her original pacemaker and the current leads were implanted in 1992. She has had 2 generator change out, most recently in 2010. Her device is a Buyer, retail 2110 non RF dual-chamber pacemaker with a battery longevity estimated at about 7 years. The atrial lead is a St. Jude [id number] and the ventricular lead was a Biotronik [id number].   [name] Paroxysmal atrial fibrillation ([place]) 09/04/2014  . Polymyalgia rheumatica ([place])   . Scoliosis   . SSS (sick sinus syndrome) ([place]) 07/13/2013  . Syncope   . Systemic hypertension   . Thoracic kyphosis     Patient Active Problem List   Diagnosis Date Noted  . Non-traumatic compression fracture of T9 thoracic vertebra ([place]) 01/02/2017  . Polymyalgia rheumatica ([place])   . Postural dizziness with near syncope   . Essential hypertension 10/11/2016  . Syncope 10/11/2016  . Postural dizziness with presyncope 10/11/2016  . Acute hypoxemic respiratory failure ([place]) 10/05/2016  . Chronic diastolic CHF (congestive heart failure) ([place])  10/05/2016  . Smoker 10/05/2016  . Closed multiple fractures of right upper extremity with ribs with routine healing 10/05/2016  . HCAP (healthcare-associated pneumonia) 10/31/2015  . COPD (chronic obstructive pulmonary disease) ([place]) 10/31/2015  . Leukocytosis 10/31/2015  . Benign essential HTN 10/31/2015  . Anxiety state   . Acute respiratory failure with hypoxia ([place]) 08/26/2015  . COPD exacerbation ([place]) 08/26/2015  . Diastolic CHF ([place]) 35/32/9924  . Systemic hypertension 08/26/2015  . Hypokalemia 08/26/2015  . Anticoagulant long-term use 08/26/2015  . DM (dermatomyositis) 08/26/2015  . Acute on chronic diastolic CHF (congestive heart failure), NYHA class 1 ([place])   . Sepsis ([place]) 06/07/2015  . CAP (community acquired pneumonia) 06/07/2015  . Acute CHF (congestive heart failure) ([place]) 06/07/2015  . Paroxysmal atrial fibrillation ([place]) 09/04/2014  . Pacemaker 07/13/2013  . CHB (complete heart block) ([place]) 07/13/2013  . SSS (sick sinus syndrome) ([place]) 07/13/2013  . Orthostatic hypotension 07/13/2013  . Aortic insufficiency 07/13/2013    Past Surgical History:  Procedure Laterality Date  . IR KYPHO THORACIC WITH BONE BIOPSY  01/04/2017  . NM MYOCAR PERF WALL MOTION  09/13/2011   Low risk  . PERMANENT PACEMAKER GENERATOR CHANGE  03/27/2009   St.Jude  . US ECHOCARDIOGRAPHY  03/28/2012   Mod LAE,mild MR,aortic sclerosis w/mod AI,mod. TR,mild PI,Stage I diastolic dysfunction  . VIDEO BRONCHOSCOPY Bilateral 10/07/2016   Procedure: VIDEO BRONCHOSCOPY WITH FLUORO;  Surgeon: Marshell Garfinkel, MD;  Location: [facility] ENDOSCOPY;  Service: Cardiopulmonary;  Laterality: Bilateral;  OB History    No data available       Home Medications    Prior to Admission medications   Medication Sig Start Date End Date Taking? Authorizing Provider  alendronate (FOSAMAX) 70 MG tablet Take 70 mg by mouth every Saturday. Take with a full glass of water on an empty stomach.    [provider]    ALPRAZolam Duanne Moron) 0.25 MG tablet Take 0.25 mg by mouth at bedtime.     [provider]  benzocaine (ORAJEL) 10 % mucosal gel Use as directed in the mouth or throat 3 (three) times daily as needed for mouth pain. 01/05/17   Dessa Phi Chahn-Yang, DO  budesonide-formoterol (SYMBICORT) 160-4.5 MCG/ACT inhaler Inhale 2 puffs into the lungs 2 (two) times daily. 11/09/16 01/01/18  Mannam, Hart Robinsons, MD  ELIQUIS 2.5 MG TABS tablet TAKE 1 TABLET(2.5 MG) BY MOUTH TWICE DAILY 12/23/16   Croitoru, Mihai, MD  escitalopram (LEXAPRO) 10 MG tablet Take 10 mg by mouth daily.    [provider]  furosemide (LASIX) 40 MG tablet Take 40 mg by mouth daily as needed (for weight gain of greater than 2lbs).    [provider]  HYDROcodone-acetaminophen (NORCO/VICODIN) 5-325 MG tablet Take 1-2 tablets by mouth every 4 (four) hours as needed for moderate pain. 01/05/17   Dessa Phi Chahn-Yang, DO  Ipratropium-Albuterol (COMBIVENT RESPIMAT) 20-100 MCG/ACT AERS respimat Inhale 1 puff into the lungs every 6 (six) hours as needed for wheezing or shortness of breath. 08/29/15   Barton Dubois, MD  metoprolol succinate (TOPROL-XL) 25 MG 24 hr tablet Take 25 mg by mouth daily.     [provider]  midodrine (PROAMATINE) 2.5 MG tablet Take 2.5 mg by mouth as needed (when systolic BP is lower than 100).     [provider]  mirtazapine (REMERON) 15 MG tablet Take 7.5 mg by mouth at bedtime.     [provider]  potassium chloride SA (K-DUR,KLOR-CON) 20 MEQ tablet Take 20 mEq by mouth daily as needed (when taking Lasix).     [provider]  predniSONE (DELTASONE) 10 MG tablet Take 10 mg by mouth daily.     [provider]    Family History Family History  Problem Relation Age of Onset  . Stroke Mother     Social History Social History  Substance Use Topics  . Smoking status: Former Smoker    Packs/day: 0.50    Years: 60.00    Types: Cigarettes  .  Smokeless tobacco: Never Used     Comment: quit 10/10/16--11/09/16  . Alcohol use No     Allergies   Patient has no known allergies.   Review of Systems Review of Systems All other systems negative except as documented in the HPI. All pertinent positives and negatives as reviewed in the HPI.  Physical Exam Updated Vital Signs BP (!) 174/75 (BP Location: Right Arm)   Pulse 70   Temp 98.1 F (36.7 C) (Oral)   Resp 16   SpO2 92%   Physical Exam  Constitutional: She is oriented to person, place, and time. She appears well-developed and well-nourished. No distress.  HENT:  Head: Normocephalic and atraumatic.  Eyes: Pupils are equal, round, and reactive to light.  Pulmonary/Chest: Effort normal.  Musculoskeletal:       Arms: Neurological: She is alert and oriented to person, place, and time.  Skin: Skin is warm and dry.  Psychiatric: She has a normal mood and affect.  Nursing note  and vitals reviewed.    ED Treatments / Results  Labs (all labs ordered are listed, but only abnormal results are displayed) Labs Reviewed - No data to display  EKG  EKG Interpretation None       Radiology No results found.  Procedures Procedures (including critical care time)  Medications Ordered in ED Medications - No data to display   Initial Impression / Assessment and Plan / ED Course  I have reviewed the triage vital signs and the nursing notes.  Pertinent labs & imaging results that were available during my care of the patient were reviewed by me and considered in my medical decision making (see chart for details).     Placed Steri-Strips and aligned the skin edges as best as possible.  The patient and daughter are advised of wound care instructions.  Told to return here as needed.  Patient agrees the plan and all questions were answered  Final Clinical Impressions(s) / ED Diagnoses   Final diagnoses:  None    New Prescriptions New Prescriptions   No medications on  file     Rebeca Allegra 01/08/17 0867    Tanna Furry, MD 01/08/17 [phone]

## 2017-01-08 DIAGNOSIS — S50912D Unspecified superficial injury of left forearm, subsequent encounter: Secondary | ICD-10-CM | POA: Diagnosis not present

## 2017-01-08 DIAGNOSIS — S90921D Unspecified superficial injury of right foot, subsequent encounter: Secondary | ICD-10-CM | POA: Diagnosis not present

## 2017-01-09 ENCOUNTER — Ambulatory Visit (INDEPENDENT_AMBULATORY_CARE_PROVIDER_SITE_OTHER): Payer: Self-pay | Admitting: Orthopaedic Surgery

## 2017-01-09 ENCOUNTER — Other Ambulatory Visit (HOSPITAL_COMMUNITY): Payer: Self-pay | Admitting: Interventional Radiology

## 2017-01-09 ENCOUNTER — Inpatient Hospital Stay: Admission: RE | Admit: 2017-01-09 | Payer: Medicare Other | Source: Ambulatory Visit

## 2017-01-09 DIAGNOSIS — S50912D Unspecified superficial injury of left forearm, subsequent encounter: Secondary | ICD-10-CM | POA: Diagnosis not present

## 2017-01-09 DIAGNOSIS — S90921D Unspecified superficial injury of right foot, subsequent encounter: Secondary | ICD-10-CM | POA: Diagnosis not present

## 2017-01-09 DIAGNOSIS — S22000A Wedge compression fracture of unspecified thoracic vertebra, initial encounter for closed fracture: Secondary | ICD-10-CM

## 2017-01-10 ENCOUNTER — Ambulatory Visit
Admission: RE | Admit: 2017-01-10 | Discharge: 2017-01-10 | Disposition: A | Discharge status: Home / Self Care | Payer: Medicare Other | Source: Ambulatory Visit | Attending: Interventional Radiology | Admitting: Interventional Radiology

## 2017-01-10 ENCOUNTER — Other Ambulatory Visit: Payer: Self-pay | Admitting: Internal Medicine

## 2017-01-10 ENCOUNTER — Telehealth: Payer: Self-pay | Admitting: Radiology

## 2017-01-10 ENCOUNTER — Ambulatory Visit
Admission: RE | Admit: 2017-01-10 | Discharge: 2017-01-10 | Disposition: A | Discharge status: Home / Self Care | Payer: Medicare Other | Source: Ambulatory Visit | Attending: Internal Medicine | Admitting: Internal Medicine

## 2017-01-10 DIAGNOSIS — M546 Pain in thoracic spine: Secondary | ICD-10-CM | POA: Diagnosis not present

## 2017-01-10 DIAGNOSIS — S22070S Wedge compression fracture of T9-T10 vertebra, sequela: Secondary | ICD-10-CM | POA: Diagnosis not present

## 2017-01-10 DIAGNOSIS — S50912D Unspecified superficial injury of left forearm, subsequent encounter: Secondary | ICD-10-CM | POA: Diagnosis not present

## 2017-01-10 DIAGNOSIS — S22000A Wedge compression fracture of unspecified thoracic vertebra, initial encounter for closed fracture: Secondary | ICD-10-CM

## 2017-01-10 DIAGNOSIS — S90921D Unspecified superficial injury of right foot, subsequent encounter: Secondary | ICD-10-CM | POA: Diagnosis not present

## 2017-01-10 DIAGNOSIS — Z9889 Other specified postprocedural states: Secondary | ICD-10-CM | POA: Diagnosis not present

## 2017-01-10 HISTORY — PX: IR RADIOLOGIST EVAL & MGMT: IMG5224

## 2017-01-10 NOTE — Progress Notes (Signed)
Chief Complaint: Patient was seen in follow-up today for  Chief Complaint  Patient presents with  . Follow-up    1 wk follow up T9 Kyphoplasty   at the request of McCullough,Heath  Referring Physician(s): McCullough,Heath  History of Present Illness: [name] is a 83 y.o. female history of with a history of acute T9 compression fracture for which she was admitted to the hospital for pain control. Additionally, she has polymyalgia rheumatica and is on chronic steroids for symptom control. She underwent percutaneous vertebral cement augmentation with kyphoplasty on 01/04/2017. She presents today for follow-up evaluation.  Unfortunately, she continues to have thoracic spine pain which radiates around anteriorly along both the right and left ribs. She can identify no exacerbating or relieving factors. The pain is near constant and may be slightly accentuated with movement. Her hydrocodone does help alleviate her pain when she takes it, although she tends to only take 1 pill per day in the evening.  She denies bowel or bladder incontinence, new paresthesias, lower extremity weakness or other neurological symptoms.  Past Medical History:  Diagnosis Date  . CHB (complete heart block) ([place]) 07/13/2013   Pacemaker dependent  . CHF (congestive heart failure) ([place])   . COPD (chronic obstructive pulmonary disease) ([place])   . DM (dermatomyositis)   . Orthostatic hypotension   . Pacemaker 07/13/2013   Her original pacemaker and the current leads were implanted in 1992. She has had 2 generator change out, most recently in 2010. Her device is a Buyer, retail 2110 non RF dual-chamber pacemaker with a battery longevity estimated at about 7 years. The atrial lead is a St. Jude [id number] and the ventricular lead was a Biotronik [id number].   [name] Paroxysmal atrial fibrillation ([place]) 09/04/2014  . Polymyalgia rheumatica ([place])   . Scoliosis   . SSS (sick sinus syndrome) ([place]) 07/13/2013  . Syncope   .  Systemic hypertension   . Thoracic kyphosis     Past Surgical History:  Procedure Laterality Date  . IR KYPHO THORACIC WITH BONE BIOPSY  01/04/2017  . NM MYOCAR PERF WALL MOTION  09/13/2011   Low risk  . PERMANENT PACEMAKER GENERATOR CHANGE  03/27/2009   St.Jude  . US ECHOCARDIOGRAPHY  03/28/2012   Mod LAE,mild MR,aortic sclerosis w/mod AI,mod. TR,mild PI,Stage I diastolic dysfunction  . VIDEO BRONCHOSCOPY Bilateral 10/07/2016   Procedure: VIDEO BRONCHOSCOPY WITH FLUORO;  Surgeon: Marshell Garfinkel, MD;  Location: [facility];  Service: Cardiopulmonary;  Laterality: Bilateral;    Allergies: Patient has no known allergies.  Medications: Prior to Admission medications   Medication Sig Start Date End Date Taking? Authorizing Provider  alendronate (FOSAMAX) 70 MG tablet Take 70 mg by mouth every Saturday. Take with a full glass of water on an empty stomach.   Yes [provider]  ALPRAZolam (XANAX) 0.25 MG tablet Take 0.25 mg by mouth at bedtime.    Yes [provider]  benzocaine (ORAJEL) 10 % mucosal gel Use as directed in the mouth or throat 3 (three) times daily as needed for mouth pain. 01/05/17  Yes Dessa Phi Chahn-Yang, DO  budesonide-formoterol (SYMBICORT) 160-4.5 MCG/ACT inhaler Inhale 2 puffs into the lungs 2 (two) times daily. 11/09/16 01/01/18 Yes Mannam, Praveen, MD  ELIQUIS 2.5 MG TABS tablet TAKE 1 TABLET(2.5 MG) BY MOUTH TWICE DAILY 12/23/16  Yes Croitoru, Mihai, MD  escitalopram (LEXAPRO) 10 MG tablet Take 10 mg by mouth daily.   Yes [provider]  furosemide (LASIX) 40 MG tablet  Take 40 mg by mouth daily as needed (for weight gain of greater than 2lbs).   Yes [provider]  HYDROcodone-acetaminophen (NORCO/VICODIN) 5-325 MG tablet Take 1-2 tablets by mouth every 4 (four) hours as needed for moderate pain. 01/05/17  Yes Dessa Phi Chahn-Yang, DO  Ipratropium-Albuterol (COMBIVENT RESPIMAT) 20-100 MCG/ACT AERS respimat Inhale 1 puff into  the lungs every 6 (six) hours as needed for wheezing or shortness of breath. 08/29/15  Yes Barton Dubois, MD  metoprolol succinate (TOPROL-XL) 25 MG 24 hr tablet Take 25 mg by mouth daily.    Yes [provider]  midodrine (PROAMATINE) 2.5 MG tablet Take 2.5 mg by mouth as needed (when systolic BP is lower than 100).    Yes [provider]  mirtazapine (REMERON) 15 MG tablet Take 7.5 mg by mouth at bedtime.    Yes [provider]  mometasone-formoterol (DULERA) 200-5 MCG/ACT AERO Inhale 2 puffs into the lungs 2 (two) times daily.   Yes [provider]  potassium chloride SA (K-DUR,KLOR-CON) 20 MEQ tablet Take 20 mEq by mouth daily as needed (when taking Lasix).    Yes [provider]  predniSONE (DELTASONE) 10 MG tablet Take 10 mg by mouth daily.    Yes [provider]     Family History  Problem Relation Age of Onset  . Stroke Mother     Social History   Social History  . Marital status: Married    Spouse name: N/A  . Number of children: N/A  . Years of education: N/A   Social History Main Topics  . Smoking status: Former Smoker    Packs/day: 0.50    Years: 60.00    Types: Cigarettes  . Smokeless tobacco: Never Used     Comment: quit 10/10/16--11/09/16  . Alcohol use No  . Drug use: No  . Sexual activity: Not on file   Other Topics Concern  . Not on file   Social History Narrative  . No narrative on file    Review of Systems: A 12 point ROS discussed and pertinent positives are indicated in the HPI above.  All other systems are negative.  Review of Systems  Vital Signs: BP (!) 86/51   Pulse 72   Temp 98.3 F (36.8 C) (Oral)   Resp 14   Ht 5\' 5"  (1.651 m)   Wt 135 lb (61.2 kg)   SpO2 94%   BMI 22.47 kg/m   Physical Exam  Constitutional: She is oriented to person, place, and time. She appears well-developed and well-nourished. No distress.  HENT:  Head: Normocephalic and atraumatic.  Eyes: No scleral  icterus.  Cardiovascular: Normal rate and regular rhythm.   Pulmonary/Chest: Effort normal.  Abdominal: Soft. She exhibits no distension. There is no tenderness.  Musculoskeletal:       Back:  TTP along the bilateral T9 ribs and paraspinal muscles.   Neurological: She is alert and oriented to person, place, and time.  Skin: Skin is warm and dry.  Psychiatric: She has a normal mood and affect. Her behavior is normal.  Nursing note and vitals reviewed.   Imaging: Dg Knee 1-2 Views Left  Result Date: 12/28/2016 CLINICAL DATA:  Pt here c/o left hip AND knee pain x 1 week. Left hip hurts when getting up. Left knee hurts all over in different areas at different time that radiates to ankle. No hx of injury. EXAM: LEFT KNEE - 1-2 VIEW COMPARISON:  None. FINDINGS: No fracture or bone lesion.  Mild to moderate medial joint space compartment narrowing. There are small marginal osteophytes from the medial and lateral compartments. Chondrocalcinosis is noted along the menisci. Small joint effusion.  Soft tissues otherwise unremarkable. Bones are demineralized. IMPRESSION: 1. No fracture or bone lesion. 2. Osteoarthritis with a small joint effusion. Electronically Signed   By: Lajean Manes M.D.   On: 12/28/2016 11:05   Dg Ankle Complete Left  Result Date: 01/01/2017 CLINICAL DATA:  Left ankle pain since hitting her foot on something 3 weeks ago. EXAM: LEFT ANKLE COMPLETE - 3+ VIEW COMPARISON:  None. FINDINGS: Mild-to-moderate diffuse lateral soft tissue swelling. Mild talofibular spur formation. No fracture, dislocation or effusion seen. IMPRESSION: No fracture. Electronically Signed   By: Claudie Revering M.D.   On: 01/01/2017 11:46   Ct Chest Wo Contrast  Result Date: 01/01/2017 CLINICAL DATA:  Severe LEFT leg pain, low back and upper back pain, progressive pain over 3 days, cough and congestion, history pneumonia, benign essential hypertension, COPD, dermatomyositis, atrial fibrillation, CHF, polymyalgia  rheumatica, former smoker EXAM: CT CHEST WITHOUT CONTRAST TECHNIQUE: Multidetector CT imaging of the chest was performed following the standard protocol without IV contrast. Sagittal and coronal MPR images reconstructed from axial data set. Respiratory motion artifacts degrade exam. COMPARISON:  CT chest 10/05/2016 FINDINGS: Cardiovascular: Atherosclerotic calcifications aorta, proximal great vessels and coronary arteries. Cardiac chambers appear mildly dilated. No pericardial effusion. Sequential pacemaker leads via RIGHT subclavian approach at RIGHT ventricle and RIGHT atrium. Mediastinum/Nodes: Scattered normal sized mediastinal lymph nodes. No thoracic adenopathy. Esophagus unremarkable. Base of cervical region normal appearance Lungs/Pleura: Small BILATERAL pleural effusions. Hyperinflated lungs with scattered peribronchial thickening and diffuse interseptal thickening unchanged. Subsegmental atelectasis in BILATERAL lower lobes, lingula, and RIGHT middle lobe. Calcified granuloma RIGHT middle lobe. Scattered tiny nodules in periphery of RIGHT lung, some stable, additional potentially obscured by infiltrates on previous exam. No definite enlarging pulmonary mass or nodule. No segmental consolidation or pneumothorax. Upper Abdomen: Multiple hepatic cysts, better characterized on previous exam which last motion artifacts, grossly stable in appearance. Probable peripelvic cyst upper pole LEFT kidney. Remaining visualized upper abdomen unremarkable. Musculoskeletal: Sclerosis at lateral RIGHT third fourth fifth sixth and seventh ribs from healing fractures. Diffuse osseous demineralization. Interval acute compression fracture of T9 vertebral body with 25% anterior height loss. IMPRESSION: Improved pulmonary infiltrates particularly in RIGHT lower lobe and RIGHT middle lobe since previous exam. Scattered subsegmental atelectasis and bronchitic changes with small BILATERAL pleural effusions. Hepatic cysts. Osseous  demineralization with interval compression fracture of T9 vertebral body with approximately 25% anterior height loss. Tiny peripheral nonspecific nodular foci RIGHT lung, recommendation below. No follow-up needed if patient is low-risk (and has no known or suspected primary neoplasm). Non-contrast chest CT can be considered in 12 months if patient is high-risk. This recommendation follows the consensus statement: Guidelines for Management of Incidental Pulmonary Nodules Detected on CT Images: From the Fleischner Society 2017; Radiology 2017; 284:228-243. Aortic Atherosclerosis (ICD10-I70.0) and Emphysema (ICD10-J43.9). Electronically Signed   By: Lavonia Dana M.D.   On: 01/01/2017 12:12   Ct Angio Chest Pe W Or Wo Contrast  Result Date: 01/02/2017 CLINICAL DATA:  83 year old female with shortness of breath and cough. Elevated D-dimer. EXAM: CT ANGIOGRAPHY CHEST WITH CONTRAST TECHNIQUE: Multidetector CT imaging of the chest was performed using the standard protocol during bolus administration of intravenous contrast. Multiplanar CT image reconstructions and MIPs were obtained to evaluate the vascular anatomy. CONTRAST:  100 cc Isovue 370 COMPARISON:  Chest CT dated 01/01/2017 FINDINGS: Cardiovascular:  There is stable moderate cardiomegaly. Multi vessel coronary vascular calcification primarily involving the LAD, left circumflex artery noted. There is no pericardial effusion. Right pectoral pacemaker device noted. There is moderate atherosclerotic calcification of the thoracic aorta. The aorta is otherwise unremarkable for the degree of enhancement. There is no CT evidence of pulmonary embolism. Mediastinum/Nodes: No hilar adenopathy. Stable appearing subcarinal lymph node measuring 8 mm in short axis. Small AP window lymph nodes appear similar to prior CT. Esophagus is grossly unremarkable. The thyroid gland is heterogeneous. Lungs/Pleura: There small bilateral pleural effusions, slightly increased compared to the  prior CT. There is associated subsegmental atelectasis of the lung bases. Bibasilar linear scarring noted. There is mild diffuse interlobular septal prominence most consistent with edema. Stable focal area of consolidative change in the lingula likely represents atelectasis/scarring. Stable right upper lobe linear atelectasis/ scarring noted. There is no pneumothorax. The central airways are patent. Upper Abdomen: Multiple scattered hepatic hypodense lesions, incompletely characterized but demonstrate fluid attenuation most consistent with cysts. There is passive flow of contrast from the right atrium into the IVC consistent with a degree of right cardiac dysfunction. Musculoskeletal: Osteopenia with degenerative changes of the spine. Old healed right rib fractures. Stable T9 compression fracture. No acute fracture. Review of the MIP images confirms the above findings. IMPRESSION: 1. No CT evidence of pulmonary embolism. 2. Cardiomegaly with mild interstitial edema. 3. Small bilateral pleural effusions, similar or slightly increased compared to prior CT. 4. Bibasilar subsegmental atelectasis versus infiltrate. Linear bibasilar atelectasis/scarring. Focal consolidative change in the lingula appears similar to the prior CT. 5.  Aortic Atherosclerosis (ICD10-I70.0). Electronically Signed   By: Anner Crete M.D.   On: 01/02/2017 04:48   Ct Thoracic Spine Wo Contrast  Result Date: 01/10/2017 CLINICAL DATA:  Compression fracture.  Vertebroplasty T9 01/04/2017 EXAM: CT THORACIC SPINE WITHOUT CONTRAST TECHNIQUE: Multidetector CT images of the thoracic were obtained using the standard protocol without intravenous contrast. COMPARISON:  CT chest 01/02/2017 FINDINGS: Alignment: Normal Vertebrae: Moderate compression fracture of T9 unchanged in height from the prior CT. Satisfactory cement positioning in the T9 vertebral body. No new fracture or mass lesion. Paraspinal and other soft tissues: Mild paraspinous soft  tissue thickening due to hematoma from fracture. Atherosclerotic aorta. Pacemaker. Multiple low-density liver lesions compatible with cysts. Disc levels: Mild disc degeneration in the thoracic spine. Negative for spinal stenosis. IMPRESSION: Satisfactory kyphoplasty T9. Mild paraspinous soft tissue thickening compatible with hematoma from fracture. No new fracture. Electronically Signed   By: Franchot Gallo M.D.   On: 01/10/2017 13:17   Ct Lumbar Spine Wo Contrast  Result Date: 01/01/2017 CLINICAL DATA:  Severe LEFT leg pain, and low back pain. Chronic but acutely progressive. EXAM: CT LUMBAR SPINE WITHOUT CONTRAST TECHNIQUE: Multidetector CT imaging of the lumbar spine was performed without intravenous contrast administration. Multiplanar CT image reconstructions were also generated. COMPARISON:  CT lumbar spine 05/24/2005. FINDINGS: Segmentation: Standard Alignment: Normal. Vertebrae: No acute fracture or focal pathologic process. Incompletely visualized Tarlov cyst at S2 appears to result in bony remodeling. Paraspinal and other soft tissues: Aortic atherosclerosis. No masses. Disc levels: L1-L2: Fairly good preservation of disc height. Osseous spurring. No definite impingement. L2-L3: Fairly good preservation of disc height. Osseous spurring facet arthropathy. Broad-based disc protrusion. LEFT subarticular zone and foraminal zone narrowing. LEFT L2 and LEFT L3 nerve root impingement. Mild stenosis. L3-L4: Disc space narrowing, worse on the LEFT. Osseous spurring. Broad-based disc protrusion central and to the LEFT, partially calcified. Facet arthropathy. Moderate to severe  stenosis. BILATERAL LEFT greater than RIGHT L3 and L4 nerve root impingement. L4-L5: Disc space narrowing, worse on the RIGHT. Osseous spurring. Facet arthropathy. Central disc protrusion, partially calcified, eccentric to the RIGHT. Severe stenosis. RIGHT greater than LEFT L4 and L5 nerve root impingement. L5-S1: Disc space narrowing.  Central protrusion. Posterior element hypertrophy. Mild stenosis. RIGHT greater than LEFT L5 and S1 nerve root impingement. Compared with study 12 years ago, there is progression of disc disease at all levels. IMPRESSION: Advanced multilevel spondylosis. Potentially symptomatic neural impingement at multiple levels; LEFT-sided predominant at L2-3 and L3-4; see comments above. No acute compression fracture or traumatic subluxation. Electronically Signed   By: Staci Righter M.D.   On: 01/01/2017 12:10   Dg Foot Complete Left  Result Date: 01/01/2017 CLINICAL DATA:  Left foot pain since hitting her foot on something 3 weeks ago. EXAM: LEFT FOOT - COMPLETE 3+ VIEW COMPARISON:  Left ankle radiographs obtained at the same time. FINDINGS: Hallux valgus. Calcifications medial to the first metatarsal head and in the plantar soft tissues of the proximal foot. No fracture or dislocation seen. Minimal inferior calcaneal spur formation. IMPRESSION: No fracture. Electronically Signed   By: Claudie Revering M.D.   On: 01/01/2017 11:48   Ir Kypho Thoracic With Bone Biopsy  Result Date: 01/04/2017 CLINICAL DATA:  83 year old female with acute symptomatic T9 compression fracture. She presents for cement augmentation. EXAM: FLUOROSCOPIC GUIDED KYPHOPLASTY OF THE T9 VERTEBRAL BODY COMPARISON:  CT scan of the chest 01/02/2017 MEDICATIONS: As antibiotic prophylaxis, 2 g Ancef was ordered pre-procedure and administered intravenously within 1 hour of incision. ANESTHESIA/SEDATION: Moderate (conscious) sedation was employed during this procedure. A total of Versed 5.5 mg and Fentanyl 75 mcg was administered intravenously. Moderate Sedation Time: 42 Minutes. The patient's level of consciousness and vital signs were monitored continuously by radiology nursing throughout the procedure under my direct supervision. FLUOROSCOPY TIME:  10 minutes, 42 seconds (102 mGy) COMPLICATIONS: None immediate. TECHNIQUE: The procedure, risks (including  but not limited to bleeding, infection, organ damage), benefits, and alternatives were explained to the patient. Questions regarding the procedure were encouraged and answered. The patient understands and consents to the procedure. The patient was placed prone on the fluoroscopic table. The skin overlying the upper thoracic region was then prepped and draped in the usual sterile fashion. Maximal barrier sterile technique was utilized including caps, mask, sterile gowns, sterile gloves, sterile drape, hand hygiene and skin antiseptic. Intravenous Fentanyl and Versed were administered as conscious sedation during continuous cardiorespiratory monitoring by the radiology RN. The left pedicle at T9 was then infiltrated with 1% lidocaine followed by the advancement of a Kyphon trocar needle through the left pedicle into the posterior one-third of the vertebral body. Subsequently, the osteo drill was advanced to the anterior third of the vertebral body. The osteo drill was retracted. Through the working cannula, a Kyphon inflatable bone tamp 15 x 2 was advanced and positioned with the distal marker approximately 5 mm from the anterior aspect of the cortex. Appropriate positioning was confirmed on the AP projection. At this time, the balloon was expanded using contrast via a Kyphon inflation syringe device via micro tubing. Inflations were continued until there was near apposition with the superior end plate. At this time, methylmethacrylate mixture was reconstituted in the Kyphon bone mixing device system. This was then loaded into the delivery mechanism, attached to Kyphon bone fillers. The balloons were deflated and removed followed by the instillation of methylmethacrylate mixture with excellent filling in  the AP and lateral projections. No extravasation was noted in the disk spaces or posteriorly into the spinal canal. No epidural venous contamination was seen. The working cannulae and the bone filler were then  retrieved and removed. Hemostasis was achieved with manual compression. The patient tolerated the procedure well without immediate postprocedural complication. IMPRESSION: 1. Technically successful T9 vertebral body augmentation using balloon kyphoplasty. 2. Per CMS PQRS reporting requirements (PQRS Measure 24): Given the patient's age of greater than 44 and the fracture site (hip, distal radius, or spine), the patient should be tested for osteoporosis using DXA, and the appropriate treatment considered based on the DXA results. Signed, Criselda Peaches, MD [facility] Radiology Electronically Signed   By: Jacqulynn Cadet M.D.   On: 01/04/2017 16:41   Dg Hip Unilat With Pelvis 2-3 Views Left  Result Date: 12/28/2016 CLINICAL DATA:  Left hip and knee pain for 1 week.  No known injury. EXAM: DG HIP (WITH OR WITHOUT PELVIS) 2-3V LEFT COMPARISON:  None. FINDINGS: No acute bony or joint abnormality is identified. The patient has moderate to moderately severe osteoarthritis about the left hip with subchondral sclerosis and osteophytosis identified. No focal bony lesion. Surrounding soft tissue structures are unremarkable. IMPRESSION: No acute finding. Moderate to moderately severe left hip osteoarthritis. Electronically Signed   By: Inge Rise M.D.   On: 12/28/2016 11:04    Labs:  CBC:  Recent Labs  10/11/16 0528 01/01/17 1155 01/02/17 0545 01/03/17 0442  WBC 11.5* 9.6 7.3 10.2  HGB 12.6 13.0 11.9* 12.4  HCT 41.7 40.6 38.0 39.1  PLT 341 292 266 286    COAGS:  Recent Labs  01/02/17 1413  INR 1.25  APTT 43*    BMP:  Recent Labs  01/01/17 1155 01/02/17 0545 01/03/17 0442 01/04/17 0420  NA 143 138 137 139  K 3.6 3.8 3.4* 4.3  CL 105 101 96* 99*  CO2 29 29 32 33*  GLUCOSE 89 90 159* 116*  BUN 18 13 16  26*  CALCIUM 8.7* 8.3* 8.7* 8.9  CREATININE 0.78 0.73 0.90 1.01*  GFRNONAA >60 >60 58* 50*  GFRAA >60 >60 >60 58*     LIVER FUNCTION TESTS:  Recent Labs  07/15/16 0957 10/05/16 0929 10/10/16 2334 01/01/17 1155  BILITOT 0.8 0.8 0.7 1.3*  AST 24 19 16 22   ALT 14 10* 12* 15  ALKPHOS 56 62 79 84  PROT 6.6 6.3* 6.6 6.9  ALBUMIN 3.5 3.2* 2.9* 3.6    TUMOR MARKERS: No results for input(s): AFPTM, CEA, CA199, CHROMGRNA in the last 8760 hours.  Assessment and Plan:  Persistent mid thoracic spine pain despite technically successful T9 KP on 01/04/17.  Repeat CT imaging today shows good position of the cement and no evidence of new fracture.  Her pain seems to be radiating out along the ribs and may be secondary inflammation.   1.) Naproxen 500 mg BID x 5 days to suppress any underlying inflammation, then PRN after that.  2.) Refilled her norco prescription 3.) Call next week to check on her and see if she has persistent pain.    Electronically Signed: Jacqulynn Cadet 01/10/2017, 2:38 PM   I spent a total of  15 Minutes in face to face in clinical consultation, greater than 50% of which was counseling/coordinating care for persistent back pain.

## 2017-01-10 NOTE — Telephone Encounter (Signed)
Patient's daughter called to review results of CT.  Per Dr Laurence Ferrari:  Recent kyphoplasty site looks good.  No new fracture noted.    Venkat Ankney Riki Rusk, RN 01/10/2017 4:24 PM

## 2017-01-11 DIAGNOSIS — S50912D Unspecified superficial injury of left forearm, subsequent encounter: Secondary | ICD-10-CM | POA: Diagnosis not present

## 2017-01-11 DIAGNOSIS — S90921D Unspecified superficial injury of right foot, subsequent encounter: Secondary | ICD-10-CM | POA: Diagnosis not present

## 2017-01-12 DIAGNOSIS — S90921D Unspecified superficial injury of right foot, subsequent encounter: Secondary | ICD-10-CM | POA: Diagnosis not present

## 2017-01-12 DIAGNOSIS — S50912D Unspecified superficial injury of left forearm, subsequent encounter: Secondary | ICD-10-CM | POA: Diagnosis not present

## 2017-01-13 DIAGNOSIS — S50912D Unspecified superficial injury of left forearm, subsequent encounter: Secondary | ICD-10-CM | POA: Diagnosis not present

## 2017-01-13 DIAGNOSIS — S90921D Unspecified superficial injury of right foot, subsequent encounter: Secondary | ICD-10-CM | POA: Diagnosis not present

## 2017-01-16 ENCOUNTER — Encounter: Payer: Medicare Other | Admitting: *Deleted

## 2017-01-16 ENCOUNTER — Telehealth: Payer: Self-pay | Admitting: Cardiology

## 2017-01-16 DIAGNOSIS — S90921D Unspecified superficial injury of right foot, subsequent encounter: Secondary | ICD-10-CM | POA: Diagnosis not present

## 2017-01-16 DIAGNOSIS — S50912D Unspecified superficial injury of left forearm, subsequent encounter: Secondary | ICD-10-CM | POA: Diagnosis not present

## 2017-01-16 NOTE — Telephone Encounter (Signed)
Confirmed remote transmission w/ pt daughter.   

## 2017-01-17 ENCOUNTER — Telehealth: Payer: Self-pay | Admitting: Radiology

## 2017-01-17 NOTE — Telephone Encounter (Signed)
Spoke w/ patient's daughter, [name].  Although the patient has been taking pain medications as prescribed on 01/10/2017, she continues to complain of back pain with radiation around her sides and into her legs.  She has a follow up appointment with her primary care physician next week.  After discussing this with Dr Laurence Ferrari, the patient may receive Rx for additional pain medications, and NM Bone Scan may be scheduled.  [name] states that she will think over options and will call back tomorrow.    Eaton Folmar Riki Rusk, RN 01/17/2017 4:49 PM

## 2017-01-18 DIAGNOSIS — S50912D Unspecified superficial injury of left forearm, subsequent encounter: Secondary | ICD-10-CM | POA: Diagnosis not present

## 2017-01-18 DIAGNOSIS — S90921D Unspecified superficial injury of right foot, subsequent encounter: Secondary | ICD-10-CM | POA: Diagnosis not present

## 2017-01-19 ENCOUNTER — Ambulatory Visit: Payer: Medicare Other | Admitting: Pulmonary Disease

## 2017-01-20 ENCOUNTER — Encounter: Payer: Self-pay | Admitting: Cardiology

## 2017-01-20 DIAGNOSIS — S50912D Unspecified superficial injury of left forearm, subsequent encounter: Secondary | ICD-10-CM | POA: Diagnosis not present

## 2017-01-20 DIAGNOSIS — S90921D Unspecified superficial injury of right foot, subsequent encounter: Secondary | ICD-10-CM | POA: Diagnosis not present

## 2017-01-24 DIAGNOSIS — S50912D Unspecified superficial injury of left forearm, subsequent encounter: Secondary | ICD-10-CM | POA: Diagnosis not present

## 2017-01-24 DIAGNOSIS — D692 Other nonthrombocytopenic purpura: Secondary | ICD-10-CM | POA: Diagnosis not present

## 2017-01-24 DIAGNOSIS — I10 Essential (primary) hypertension: Secondary | ICD-10-CM | POA: Diagnosis not present

## 2017-01-24 DIAGNOSIS — S90921D Unspecified superficial injury of right foot, subsequent encounter: Secondary | ICD-10-CM | POA: Diagnosis not present

## 2017-01-24 DIAGNOSIS — J449 Chronic obstructive pulmonary disease, unspecified: Secondary | ICD-10-CM | POA: Diagnosis not present

## 2017-01-24 DIAGNOSIS — M8000XD Age-related osteoporosis with current pathological fracture, unspecified site, subsequent encounter for fracture with routine healing: Secondary | ICD-10-CM | POA: Diagnosis not present

## 2017-01-24 DIAGNOSIS — Z79899 Other long term (current) drug therapy: Secondary | ICD-10-CM | POA: Diagnosis not present

## 2017-01-24 DIAGNOSIS — I5032 Chronic diastolic (congestive) heart failure: Secondary | ICD-10-CM | POA: Diagnosis not present

## 2017-01-24 DIAGNOSIS — I480 Paroxysmal atrial fibrillation: Secondary | ICD-10-CM | POA: Diagnosis not present

## 2017-01-25 DIAGNOSIS — S50912D Unspecified superficial injury of left forearm, subsequent encounter: Secondary | ICD-10-CM | POA: Diagnosis not present

## 2017-01-25 DIAGNOSIS — S90921D Unspecified superficial injury of right foot, subsequent encounter: Secondary | ICD-10-CM | POA: Diagnosis not present

## 2017-01-26 DIAGNOSIS — S50912D Unspecified superficial injury of left forearm, subsequent encounter: Secondary | ICD-10-CM | POA: Diagnosis not present

## 2017-01-26 DIAGNOSIS — S90921D Unspecified superficial injury of right foot, subsequent encounter: Secondary | ICD-10-CM | POA: Diagnosis not present

## 2017-01-27 DIAGNOSIS — S50912D Unspecified superficial injury of left forearm, subsequent encounter: Secondary | ICD-10-CM | POA: Diagnosis not present

## 2017-01-27 DIAGNOSIS — S90921D Unspecified superficial injury of right foot, subsequent encounter: Secondary | ICD-10-CM | POA: Diagnosis not present

## 2017-01-30 DIAGNOSIS — S50912D Unspecified superficial injury of left forearm, subsequent encounter: Secondary | ICD-10-CM | POA: Diagnosis not present

## 2017-01-30 DIAGNOSIS — S90921D Unspecified superficial injury of right foot, subsequent encounter: Secondary | ICD-10-CM | POA: Diagnosis not present

## 2017-02-01 ENCOUNTER — Ambulatory Visit (INDEPENDENT_AMBULATORY_CARE_PROVIDER_SITE_OTHER): Payer: Medicare Other | Admitting: *Deleted

## 2017-02-01 DIAGNOSIS — I442 Atrioventricular block, complete: Secondary | ICD-10-CM

## 2017-02-01 DIAGNOSIS — S90921D Unspecified superficial injury of right foot, subsequent encounter: Secondary | ICD-10-CM | POA: Diagnosis not present

## 2017-02-01 DIAGNOSIS — I495 Sick sinus syndrome: Secondary | ICD-10-CM | POA: Diagnosis not present

## 2017-02-01 DIAGNOSIS — I480 Paroxysmal atrial fibrillation: Secondary | ICD-10-CM

## 2017-02-01 DIAGNOSIS — S50912D Unspecified superficial injury of left forearm, subsequent encounter: Secondary | ICD-10-CM | POA: Diagnosis not present

## 2017-02-02 DIAGNOSIS — S90921D Unspecified superficial injury of right foot, subsequent encounter: Secondary | ICD-10-CM | POA: Diagnosis not present

## 2017-02-02 DIAGNOSIS — S50912D Unspecified superficial injury of left forearm, subsequent encounter: Secondary | ICD-10-CM | POA: Diagnosis not present

## 2017-02-02 NOTE — Progress Notes (Signed)
Remote pacemaker check. 

## 2017-02-03 DIAGNOSIS — S90921D Unspecified superficial injury of right foot, subsequent encounter: Secondary | ICD-10-CM | POA: Diagnosis not present

## 2017-02-03 DIAGNOSIS — S50912D Unspecified superficial injury of left forearm, subsequent encounter: Secondary | ICD-10-CM | POA: Diagnosis not present

## 2017-02-06 DIAGNOSIS — M7989 Other specified soft tissue disorders: Secondary | ICD-10-CM | POA: Diagnosis not present

## 2017-02-06 DIAGNOSIS — S90921D Unspecified superficial injury of right foot, subsequent encounter: Secondary | ICD-10-CM | POA: Diagnosis not present

## 2017-02-06 DIAGNOSIS — S50912D Unspecified superficial injury of left forearm, subsequent encounter: Secondary | ICD-10-CM | POA: Diagnosis not present

## 2017-02-06 DIAGNOSIS — M25572 Pain in left ankle and joints of left foot: Secondary | ICD-10-CM | POA: Diagnosis not present

## 2017-02-06 DIAGNOSIS — M79672 Pain in left foot: Secondary | ICD-10-CM | POA: Diagnosis not present

## 2017-02-07 DIAGNOSIS — S50912D Unspecified superficial injury of left forearm, subsequent encounter: Secondary | ICD-10-CM | POA: Diagnosis not present

## 2017-02-07 DIAGNOSIS — S90921D Unspecified superficial injury of right foot, subsequent encounter: Secondary | ICD-10-CM | POA: Diagnosis not present

## 2017-02-08 DIAGNOSIS — S90921D Unspecified superficial injury of right foot, subsequent encounter: Secondary | ICD-10-CM | POA: Diagnosis not present

## 2017-02-08 DIAGNOSIS — S50912D Unspecified superficial injury of left forearm, subsequent encounter: Secondary | ICD-10-CM | POA: Diagnosis not present

## 2017-02-09 DIAGNOSIS — S90921D Unspecified superficial injury of right foot, subsequent encounter: Secondary | ICD-10-CM | POA: Diagnosis not present

## 2017-02-09 DIAGNOSIS — S50912D Unspecified superficial injury of left forearm, subsequent encounter: Secondary | ICD-10-CM | POA: Diagnosis not present

## 2017-02-10 LAB — CUP PACEART REMOTE DEVICE CHECK
Battery Remaining Longevity: 7 mo
Battery Remaining Percentage: 6 %
Battery Voltage: 2.68 V
Brady Statistic AP VP Percent: 92 %
Brady Statistic AP VS Percent: 1 %
Brady Statistic AS VP Percent: 7.7 %
Brady Statistic AS VS Percent: 1 %
Brady Statistic RA Percent Paced: 87 %
Brady Statistic RV Percent Paced: 99 %
Date Time Interrogation Session: 20180814140537
Implantable Lead Implant Date: 19920213
Implantable Lead Implant Date: 19920213
Implantable Lead Location: 753859
Implantable Lead Location: 753860
Implantable Lead Serial Number: 23000302
Implantable Pulse Generator Implant Date: 20101008
Lead Channel Impedance Value: 350 Ohm
Lead Channel Impedance Value: 400 Ohm
Lead Channel Pacing Threshold Amplitude: 0.75 V
Lead Channel Pacing Threshold Amplitude: 0.75 V
Lead Channel Pacing Threshold Pulse Width: 0.4 ms
Lead Channel Pacing Threshold Pulse Width: 0.4 ms
Lead Channel Sensing Intrinsic Amplitude: 2.8 mV
Lead Channel Sensing Intrinsic Amplitude: 7.5 mV
Lead Channel Setting Pacing Amplitude: 1 V
Lead Channel Setting Pacing Amplitude: 2.25 V
Lead Channel Setting Pacing Pulse Width: 0.4 ms
Lead Channel Setting Sensing Sensitivity: 4 mV
Pulse Gen Model: 2110
Pulse Gen Serial Number: 2314326

## 2017-02-13 DIAGNOSIS — S90921D Unspecified superficial injury of right foot, subsequent encounter: Secondary | ICD-10-CM | POA: Diagnosis not present

## 2017-02-13 DIAGNOSIS — S50912D Unspecified superficial injury of left forearm, subsequent encounter: Secondary | ICD-10-CM | POA: Diagnosis not present

## 2017-02-14 ENCOUNTER — Encounter: Payer: Self-pay | Admitting: Cardiology

## 2017-02-16 DIAGNOSIS — S90921D Unspecified superficial injury of right foot, subsequent encounter: Secondary | ICD-10-CM | POA: Diagnosis not present

## 2017-02-16 DIAGNOSIS — S50912D Unspecified superficial injury of left forearm, subsequent encounter: Secondary | ICD-10-CM | POA: Diagnosis not present

## 2017-02-17 ENCOUNTER — Other Ambulatory Visit: Payer: Self-pay | Admitting: *Deleted

## 2017-02-21 ENCOUNTER — Ambulatory Visit: Payer: Medicare Other | Admitting: Pulmonary Disease

## 2017-02-21 MED ORDER — FUROSEMIDE 40 MG PO TABS: 40.0000 mg | ORAL_TABLET | Freq: Every day | ORAL | 5 refills | Status: DC | PRN

## 2017-02-21 NOTE — Telephone Encounter (Signed)
Rx(s) sent to pharmacy electronically.  

## 2017-02-22 ENCOUNTER — Ambulatory Visit: Payer: Medicare Other | Admitting: Pulmonary Disease

## 2017-02-22 DIAGNOSIS — S86912A Strain of unspecified muscle(s) and tendon(s) at lower leg level, left leg, initial encounter: Secondary | ICD-10-CM | POA: Diagnosis not present

## 2017-02-22 DIAGNOSIS — I872 Venous insufficiency (chronic) (peripheral): Secondary | ICD-10-CM | POA: Diagnosis not present

## 2017-02-22 DIAGNOSIS — I10 Essential (primary) hypertension: Secondary | ICD-10-CM | POA: Diagnosis not present

## 2017-02-22 DIAGNOSIS — S86819A Strain of other muscle(s) and tendon(s) at lower leg level, unspecified leg, initial encounter: Secondary | ICD-10-CM | POA: Diagnosis not present

## 2017-02-24 DIAGNOSIS — S22070D Wedge compression fracture of T9-T10 vertebra, subsequent encounter for fracture with routine healing: Secondary | ICD-10-CM | POA: Diagnosis not present

## 2017-02-24 DIAGNOSIS — M353 Polymyalgia rheumatica: Secondary | ICD-10-CM | POA: Diagnosis not present

## 2017-02-24 DIAGNOSIS — F419 Anxiety disorder, unspecified: Secondary | ICD-10-CM | POA: Diagnosis not present

## 2017-02-24 DIAGNOSIS — J441 Chronic obstructive pulmonary disease with (acute) exacerbation: Secondary | ICD-10-CM | POA: Diagnosis not present

## 2017-02-24 DIAGNOSIS — R262 Difficulty in walking, not elsewhere classified: Secondary | ICD-10-CM | POA: Diagnosis not present

## 2017-02-24 DIAGNOSIS — M6281 Muscle weakness (generalized): Secondary | ICD-10-CM | POA: Diagnosis not present

## 2017-02-24 DIAGNOSIS — Z4789 Encounter for other orthopedic aftercare: Secondary | ICD-10-CM | POA: Diagnosis not present

## 2017-02-24 DIAGNOSIS — I5032 Chronic diastolic (congestive) heart failure: Secondary | ICD-10-CM | POA: Diagnosis not present

## 2017-02-27 DIAGNOSIS — M353 Polymyalgia rheumatica: Secondary | ICD-10-CM | POA: Diagnosis not present

## 2017-02-27 DIAGNOSIS — Z4789 Encounter for other orthopedic aftercare: Secondary | ICD-10-CM | POA: Diagnosis not present

## 2017-02-27 DIAGNOSIS — S22070D Wedge compression fracture of T9-T10 vertebra, subsequent encounter for fracture with routine healing: Secondary | ICD-10-CM | POA: Diagnosis not present

## 2017-02-27 DIAGNOSIS — M6281 Muscle weakness (generalized): Secondary | ICD-10-CM | POA: Diagnosis not present

## 2017-02-27 DIAGNOSIS — R262 Difficulty in walking, not elsewhere classified: Secondary | ICD-10-CM | POA: Diagnosis not present

## 2017-02-27 DIAGNOSIS — I5032 Chronic diastolic (congestive) heart failure: Secondary | ICD-10-CM | POA: Diagnosis not present

## 2017-03-01 DIAGNOSIS — R262 Difficulty in walking, not elsewhere classified: Secondary | ICD-10-CM | POA: Diagnosis not present

## 2017-03-01 DIAGNOSIS — M353 Polymyalgia rheumatica: Secondary | ICD-10-CM | POA: Diagnosis not present

## 2017-03-01 DIAGNOSIS — S22070D Wedge compression fracture of T9-T10 vertebra, subsequent encounter for fracture with routine healing: Secondary | ICD-10-CM | POA: Diagnosis not present

## 2017-03-01 DIAGNOSIS — M6281 Muscle weakness (generalized): Secondary | ICD-10-CM | POA: Diagnosis not present

## 2017-03-01 DIAGNOSIS — Z4789 Encounter for other orthopedic aftercare: Secondary | ICD-10-CM | POA: Diagnosis not present

## 2017-03-01 DIAGNOSIS — I5032 Chronic diastolic (congestive) heart failure: Secondary | ICD-10-CM | POA: Diagnosis not present

## 2017-03-02 DIAGNOSIS — M353 Polymyalgia rheumatica: Secondary | ICD-10-CM | POA: Diagnosis not present

## 2017-03-02 DIAGNOSIS — S22070D Wedge compression fracture of T9-T10 vertebra, subsequent encounter for fracture with routine healing: Secondary | ICD-10-CM | POA: Diagnosis not present

## 2017-03-02 DIAGNOSIS — I5032 Chronic diastolic (congestive) heart failure: Secondary | ICD-10-CM | POA: Diagnosis not present

## 2017-03-02 DIAGNOSIS — M6281 Muscle weakness (generalized): Secondary | ICD-10-CM | POA: Diagnosis not present

## 2017-03-02 DIAGNOSIS — R262 Difficulty in walking, not elsewhere classified: Secondary | ICD-10-CM | POA: Diagnosis not present

## 2017-03-02 DIAGNOSIS — Z4789 Encounter for other orthopedic aftercare: Secondary | ICD-10-CM | POA: Diagnosis not present

## 2017-03-03 DIAGNOSIS — M6281 Muscle weakness (generalized): Secondary | ICD-10-CM | POA: Diagnosis not present

## 2017-03-03 DIAGNOSIS — R262 Difficulty in walking, not elsewhere classified: Secondary | ICD-10-CM | POA: Diagnosis not present

## 2017-03-03 DIAGNOSIS — S22070D Wedge compression fracture of T9-T10 vertebra, subsequent encounter for fracture with routine healing: Secondary | ICD-10-CM | POA: Diagnosis not present

## 2017-03-03 DIAGNOSIS — I5032 Chronic diastolic (congestive) heart failure: Secondary | ICD-10-CM | POA: Diagnosis not present

## 2017-03-03 DIAGNOSIS — Z4789 Encounter for other orthopedic aftercare: Secondary | ICD-10-CM | POA: Diagnosis not present

## 2017-03-03 DIAGNOSIS — M353 Polymyalgia rheumatica: Secondary | ICD-10-CM | POA: Diagnosis not present

## 2017-03-06 DIAGNOSIS — Z4789 Encounter for other orthopedic aftercare: Secondary | ICD-10-CM | POA: Diagnosis not present

## 2017-03-06 DIAGNOSIS — I5032 Chronic diastolic (congestive) heart failure: Secondary | ICD-10-CM | POA: Diagnosis not present

## 2017-03-06 DIAGNOSIS — S22070D Wedge compression fracture of T9-T10 vertebra, subsequent encounter for fracture with routine healing: Secondary | ICD-10-CM | POA: Diagnosis not present

## 2017-03-06 DIAGNOSIS — M353 Polymyalgia rheumatica: Secondary | ICD-10-CM | POA: Diagnosis not present

## 2017-03-06 DIAGNOSIS — R262 Difficulty in walking, not elsewhere classified: Secondary | ICD-10-CM | POA: Diagnosis not present

## 2017-03-06 DIAGNOSIS — M6281 Muscle weakness (generalized): Secondary | ICD-10-CM | POA: Diagnosis not present

## 2017-03-07 DIAGNOSIS — S22070D Wedge compression fracture of T9-T10 vertebra, subsequent encounter for fracture with routine healing: Secondary | ICD-10-CM | POA: Diagnosis not present

## 2017-03-07 DIAGNOSIS — M6281 Muscle weakness (generalized): Secondary | ICD-10-CM | POA: Diagnosis not present

## 2017-03-07 DIAGNOSIS — Z4789 Encounter for other orthopedic aftercare: Secondary | ICD-10-CM | POA: Diagnosis not present

## 2017-03-07 DIAGNOSIS — M353 Polymyalgia rheumatica: Secondary | ICD-10-CM | POA: Diagnosis not present

## 2017-03-07 DIAGNOSIS — R262 Difficulty in walking, not elsewhere classified: Secondary | ICD-10-CM | POA: Diagnosis not present

## 2017-03-07 DIAGNOSIS — I5032 Chronic diastolic (congestive) heart failure: Secondary | ICD-10-CM | POA: Diagnosis not present

## 2017-03-08 ENCOUNTER — Other Ambulatory Visit: Payer: Self-pay | Admitting: Cardiovascular Disease

## 2017-03-08 DIAGNOSIS — Z4789 Encounter for other orthopedic aftercare: Secondary | ICD-10-CM | POA: Diagnosis not present

## 2017-03-08 DIAGNOSIS — I5032 Chronic diastolic (congestive) heart failure: Secondary | ICD-10-CM | POA: Diagnosis not present

## 2017-03-08 DIAGNOSIS — M353 Polymyalgia rheumatica: Secondary | ICD-10-CM | POA: Diagnosis not present

## 2017-03-08 DIAGNOSIS — M6281 Muscle weakness (generalized): Secondary | ICD-10-CM | POA: Diagnosis not present

## 2017-03-08 DIAGNOSIS — R262 Difficulty in walking, not elsewhere classified: Secondary | ICD-10-CM | POA: Diagnosis not present

## 2017-03-08 DIAGNOSIS — S22070D Wedge compression fracture of T9-T10 vertebra, subsequent encounter for fracture with routine healing: Secondary | ICD-10-CM | POA: Diagnosis not present

## 2017-03-10 DIAGNOSIS — M6281 Muscle weakness (generalized): Secondary | ICD-10-CM | POA: Diagnosis not present

## 2017-03-10 DIAGNOSIS — Z4789 Encounter for other orthopedic aftercare: Secondary | ICD-10-CM | POA: Diagnosis not present

## 2017-03-10 DIAGNOSIS — S22070D Wedge compression fracture of T9-T10 vertebra, subsequent encounter for fracture with routine healing: Secondary | ICD-10-CM | POA: Diagnosis not present

## 2017-03-10 DIAGNOSIS — I5032 Chronic diastolic (congestive) heart failure: Secondary | ICD-10-CM | POA: Diagnosis not present

## 2017-03-10 DIAGNOSIS — R262 Difficulty in walking, not elsewhere classified: Secondary | ICD-10-CM | POA: Diagnosis not present

## 2017-03-10 DIAGNOSIS — M353 Polymyalgia rheumatica: Secondary | ICD-10-CM | POA: Diagnosis not present

## 2017-03-13 DIAGNOSIS — M6281 Muscle weakness (generalized): Secondary | ICD-10-CM | POA: Diagnosis not present

## 2017-03-13 DIAGNOSIS — S22070D Wedge compression fracture of T9-T10 vertebra, subsequent encounter for fracture with routine healing: Secondary | ICD-10-CM | POA: Diagnosis not present

## 2017-03-13 DIAGNOSIS — I5032 Chronic diastolic (congestive) heart failure: Secondary | ICD-10-CM | POA: Diagnosis not present

## 2017-03-13 DIAGNOSIS — Z4789 Encounter for other orthopedic aftercare: Secondary | ICD-10-CM | POA: Diagnosis not present

## 2017-03-13 DIAGNOSIS — M353 Polymyalgia rheumatica: Secondary | ICD-10-CM | POA: Diagnosis not present

## 2017-03-13 DIAGNOSIS — R262 Difficulty in walking, not elsewhere classified: Secondary | ICD-10-CM | POA: Diagnosis not present

## 2017-03-15 DIAGNOSIS — Z4789 Encounter for other orthopedic aftercare: Secondary | ICD-10-CM | POA: Diagnosis not present

## 2017-03-15 DIAGNOSIS — M353 Polymyalgia rheumatica: Secondary | ICD-10-CM | POA: Diagnosis not present

## 2017-03-15 DIAGNOSIS — R262 Difficulty in walking, not elsewhere classified: Secondary | ICD-10-CM | POA: Diagnosis not present

## 2017-03-15 DIAGNOSIS — S22070D Wedge compression fracture of T9-T10 vertebra, subsequent encounter for fracture with routine healing: Secondary | ICD-10-CM | POA: Diagnosis not present

## 2017-03-15 DIAGNOSIS — I5032 Chronic diastolic (congestive) heart failure: Secondary | ICD-10-CM | POA: Diagnosis not present

## 2017-03-15 DIAGNOSIS — M6281 Muscle weakness (generalized): Secondary | ICD-10-CM | POA: Diagnosis not present

## 2017-03-18 ENCOUNTER — Inpatient Hospital Stay (HOSPITAL_COMMUNITY)
Admission: EM | Admit: 2017-03-18 | Discharge: 2017-03-20 | DRG: 291 | Disposition: A | Discharge status: Home Health | Payer: Medicare Other | Attending: Internal Medicine | Admitting: Internal Medicine

## 2017-03-18 ENCOUNTER — Emergency Department (HOSPITAL_COMMUNITY): Payer: Medicare Other

## 2017-03-18 ENCOUNTER — Encounter (HOSPITAL_COMMUNITY): Payer: Self-pay

## 2017-03-18 DIAGNOSIS — I10 Essential (primary) hypertension: Secondary | ICD-10-CM | POA: Diagnosis present

## 2017-03-18 DIAGNOSIS — I5032 Chronic diastolic (congestive) heart failure: Secondary | ICD-10-CM | POA: Diagnosis not present

## 2017-03-18 DIAGNOSIS — Z7901 Long term (current) use of anticoagulants: Secondary | ICD-10-CM

## 2017-03-18 DIAGNOSIS — I959 Hypotension, unspecified: Secondary | ICD-10-CM | POA: Diagnosis present

## 2017-03-18 DIAGNOSIS — M4854XA Collapsed vertebra, not elsewhere classified, thoracic region, initial encounter for fracture: Secondary | ICD-10-CM | POA: Diagnosis present

## 2017-03-18 DIAGNOSIS — J441 Chronic obstructive pulmonary disease with (acute) exacerbation: Secondary | ICD-10-CM | POA: Diagnosis present

## 2017-03-18 DIAGNOSIS — M40204 Unspecified kyphosis, thoracic region: Secondary | ICD-10-CM | POA: Diagnosis present

## 2017-03-18 DIAGNOSIS — M353 Polymyalgia rheumatica: Secondary | ICD-10-CM | POA: Diagnosis present

## 2017-03-18 DIAGNOSIS — R0902 Hypoxemia: Secondary | ICD-10-CM | POA: Diagnosis not present

## 2017-03-18 DIAGNOSIS — J9621 Acute and chronic respiratory failure with hypoxia: Secondary | ICD-10-CM | POA: Diagnosis not present

## 2017-03-18 DIAGNOSIS — I509 Heart failure, unspecified: Secondary | ICD-10-CM

## 2017-03-18 DIAGNOSIS — I110 Hypertensive heart disease with heart failure: Principal | ICD-10-CM | POA: Diagnosis present

## 2017-03-18 DIAGNOSIS — M419 Scoliosis, unspecified: Secondary | ICD-10-CM | POA: Diagnosis present

## 2017-03-18 DIAGNOSIS — Z87891 Personal history of nicotine dependence: Secondary | ICD-10-CM

## 2017-03-18 DIAGNOSIS — I480 Paroxysmal atrial fibrillation: Secondary | ICD-10-CM | POA: Diagnosis present

## 2017-03-18 DIAGNOSIS — Z7983 Long term (current) use of bisphosphonates: Secondary | ICD-10-CM

## 2017-03-18 DIAGNOSIS — Z7952 Long term (current) use of systemic steroids: Secondary | ICD-10-CM

## 2017-03-18 DIAGNOSIS — H9190 Unspecified hearing loss, unspecified ear: Secondary | ICD-10-CM | POA: Diagnosis present

## 2017-03-18 DIAGNOSIS — H7291 Unspecified perforation of tympanic membrane, right ear: Secondary | ICD-10-CM | POA: Diagnosis not present

## 2017-03-18 DIAGNOSIS — Z66 Do not resuscitate: Secondary | ICD-10-CM | POA: Diagnosis present

## 2017-03-18 DIAGNOSIS — J9601 Acute respiratory failure with hypoxia: Secondary | ICD-10-CM | POA: Diagnosis present

## 2017-03-18 DIAGNOSIS — G8929 Other chronic pain: Secondary | ICD-10-CM | POA: Diagnosis present

## 2017-03-18 DIAGNOSIS — H6121 Impacted cerumen, right ear: Secondary | ICD-10-CM | POA: Diagnosis present

## 2017-03-18 DIAGNOSIS — M3313 Other dermatomyositis without myopathy: Secondary | ICD-10-CM | POA: Diagnosis not present

## 2017-03-18 DIAGNOSIS — Z950 Presence of cardiac pacemaker: Secondary | ICD-10-CM

## 2017-03-18 DIAGNOSIS — M4844XA Fatigue fracture of vertebra, thoracic region, initial encounter for fracture: Secondary | ICD-10-CM | POA: Diagnosis present

## 2017-03-18 DIAGNOSIS — F0390 Unspecified dementia without behavioral disturbance: Secondary | ICD-10-CM | POA: Diagnosis present

## 2017-03-18 DIAGNOSIS — Z823 Family history of stroke: Secondary | ICD-10-CM

## 2017-03-18 DIAGNOSIS — R0602 Shortness of breath: Secondary | ICD-10-CM | POA: Diagnosis not present

## 2017-03-18 LAB — COMPREHENSIVE METABOLIC PANEL
ALBUMIN: 3.3 g/dL — AB (ref 3.5–5.0)
ALT: 17 U/L (ref 14–54)
AST: 28 U/L (ref 15–41)
Alkaline Phosphatase: 85 U/L (ref 38–126)
Anion gap: 10 (ref 5–15)
BILIRUBIN TOTAL: 0.8 mg/dL (ref 0.3–1.2)
BUN: 13 mg/dL (ref 6–20)
CHLORIDE: 108 mmol/L (ref 101–111)
CO2: 24 mmol/L (ref 22–32)
Calcium: 8.6 mg/dL — ABNORMAL LOW (ref 8.9–10.3)
Creatinine, Ser: 0.72 mg/dL (ref 0.44–1.00)
GFR calc Af Amer: >60 mL/min (ref >60)
GFR calc non Af Amer: >60 mL/min (ref >60)
GLUCOSE: 151 mg/dL — AB (ref 65–99)
POTASSIUM: 4.1 mmol/L (ref 3.5–5.1)
Sodium: 142 mmol/L (ref 135–145)
Total Protein: 6.5 g/dL (ref 6.5–8.1)

## 2017-03-18 LAB — CBC WITH DIFFERENTIAL/PLATELET
BASOS ABS: 0 10*3/uL (ref 0.0–0.1)
BASOS PCT: 0 %
Eosinophils Absolute: 0 10*3/uL (ref 0.0–0.7)
Eosinophils Relative: 0 %
HEMATOCRIT: 35.5 % — AB (ref 36.0–46.0)
Hemoglobin: 11.2 g/dL — ABNORMAL LOW (ref 12.0–15.0)
Lymphocytes Relative: 15 %
Lymphs Abs: 1.4 10*3/uL (ref 0.7–4.0)
MCH: 28.9 pg (ref 26.0–34.0)
MCHC: 31.5 g/dL (ref 30.0–36.0)
MCV: 91.7 fL (ref 78.0–100.0)
MONO ABS: 0.7 10*3/uL (ref 0.1–1.0)
Monocytes Relative: 7 %
NEUTROS ABS: 7.7 10*3/uL (ref 1.7–7.7)
Neutrophils Relative %: 78 %
PLATELETS: 289 10*3/uL (ref 150–400)
RBC: 3.87 MIL/uL (ref 3.87–5.11)
RDW: 15.3 % (ref 11.5–15.5)
WBC: 9.8 10*3/uL (ref 4.0–10.5)

## 2017-03-18 LAB — I-STAT TROPONIN, ED: Troponin i, poc: 0.02 ng/mL (ref 0.00–0.08)

## 2017-03-18 LAB — BRAIN NATRIURETIC PEPTIDE: B Natriuretic Peptide: 706.7 pg/mL — ABNORMAL HIGH (ref 0.0–100.0)

## 2017-03-18 MED ORDER — ALBUTEROL SULFATE (2.5 MG/3ML) 0.083% IN NEBU
5.0000 mg | INHALATION_SOLUTION | Freq: Once | RESPIRATORY_TRACT | Status: AC
  Administered 2017-03-18: 5 mg via RESPIRATORY_TRACT
  Filled 2017-03-18: qty 6

## 2017-03-18 MED ORDER — IPRATROPIUM-ALBUTEROL 0.5-2.5 (3) MG/3ML IN SOLN
3.0000 mL | Freq: Once | RESPIRATORY_TRACT | Status: AC
  Administered 2017-03-18: 3 mL via RESPIRATORY_TRACT
  Filled 2017-03-18: qty 3

## 2017-03-18 MED ORDER — METHYLPREDNISOLONE SODIUM SUCC 125 MG IJ SOLR
125.0000 mg | Freq: Once | INTRAMUSCULAR | Status: AC
  Administered 2017-03-18: 125 mg via INTRAVENOUS
  Filled 2017-03-18: qty 2

## 2017-03-18 MED ORDER — FUROSEMIDE 10 MG/ML IJ SOLN
40.0000 mg | Freq: Once | INTRAMUSCULAR | Status: AC
  Administered 2017-03-19: 40 mg via INTRAVENOUS
  Filled 2017-03-18: qty 4

## 2017-03-18 NOTE — ED Provider Notes (Signed)
[facility] DEPT Provider Note   CSN: [id number] Arrival date & time: 03/18/17  [name]     History   Chief Complaint Chief Complaint  Patient presents with  . Shortness of Breath    HPI [name] is a 83 y.o. female.  HPI  2 days of cough, shortness of breath, coughing up clear sputum Shortness of breath better with rest, worse with exertion, worse with laying down Has hx of PMR, uses compression stockings so swelling of legs is better that it has been. Oxygen dropping to 87% at home, at rest was 91% Ear on right feels full of fluid as well No fevers Rhinorrhea, sinus congestion No chest pain Wheezing has been present COPD, uses symbicort, but had not had the chance to use albuterol inhaler at home, cannot tell if she feels better after treatment here Weights have been about the same, but hadn't checked since Thursday because out of town so doesn't know change since she has become symptomatic.  Past Medical History:  Diagnosis Date  . CHB (complete heart block) ([place]) 07/13/2013   Pacemaker dependent  . CHF (congestive heart failure) ([place])   . COPD (chronic obstructive pulmonary disease) ([place])   . DM (dermatomyositis)   . Orthostatic hypotension   . Pacemaker 07/13/2013   Her original pacemaker and the current leads were implanted in 1992. She has had 2 generator change out, most recently in 2010. Her device is a Buyer, retail 2110 non RF dual-chamber pacemaker with a battery longevity estimated at about 7 years. The atrial lead is a St. Jude [id number] and the ventricular lead was a Biotronik [id number].   [name] Paroxysmal atrial fibrillation ([place]) 09/04/2014  . Polymyalgia rheumatica ([place])   . Scoliosis   . SSS (sick sinus syndrome) ([place]) 07/13/2013  . Syncope   . Systemic hypertension   . Thoracic kyphosis     Patient Active Problem List   Diagnosis Date Noted  . Non-traumatic compression fracture of T9 thoracic vertebra ([place]) 01/02/2017  . Polymyalgia rheumatica ([place])    . Postural dizziness with near syncope   . Essential hypertension 10/11/2016  . Syncope 10/11/2016  . Postural dizziness with presyncope 10/11/2016  . Acute hypoxemic respiratory failure ([place]) 10/05/2016  . Chronic diastolic CHF (congestive heart failure) ([place]) 10/05/2016  . Smoker 10/05/2016  . Closed multiple fractures of right upper extremity with ribs with routine healing 10/05/2016  . HCAP (healthcare-associated pneumonia) 10/31/2015  . COPD (chronic obstructive pulmonary disease) ([place]) 10/31/2015  . Leukocytosis 10/31/2015  . Benign essential HTN 10/31/2015  . Anxiety state   . Acute respiratory failure with hypoxia ([place]) 08/26/2015  . COPD exacerbation ([place]) 08/26/2015  . Diastolic CHF ([place]) 79/39/0300  . Systemic hypertension 08/26/2015  . Hypokalemia 08/26/2015  . Anticoagulant long-term use 08/26/2015  . DM (dermatomyositis) 08/26/2015  . Acute on chronic diastolic CHF (congestive heart failure), NYHA class 1 ([place])   . Sepsis ([place]) 06/07/2015  . CAP (community acquired pneumonia) 06/07/2015  . Acute CHF (congestive heart failure) ([place]) 06/07/2015  . Paroxysmal atrial fibrillation ([place]) 09/04/2014  . Pacemaker 07/13/2013  . CHB (complete heart block) ([employer]) 07/13/2013  . SSS (sick sinus syndrome) ([place]) 07/13/2013  . Orthostatic hypotension 07/13/2013  . Aortic insufficiency 07/13/2013    Past Surgical History:  Procedure Laterality Date  . IR KYPHO THORACIC WITH BONE BIOPSY  01/04/2017  . NM MYOCAR PERF WALL MOTION  09/13/2011   Low risk  . PERMANENT PACEMAKER GENERATOR CHANGE  03/27/2009   [employer]  .  US ECHOCARDIOGRAPHY  03/28/2012   Mod LAE,mild MR,aortic sclerosis w/mod AI,mod. TR,mild PI,Stage I diastolic dysfunction  . VIDEO BRONCHOSCOPY Bilateral 10/07/2016   Procedure: VIDEO BRONCHOSCOPY WITH FLUORO;  Surgeon: Marshell Garfinkel, MD;  Location: [facility];  Service: Cardiopulmonary;  Laterality: Bilateral;    OB History    No data available       Home  Medications    Prior to Admission medications   Medication Sig Start Date End Date Taking? Authorizing Provider  alendronate (FOSAMAX) 70 MG tablet Take 70 mg by mouth every Saturday. Take with a full glass of water on an empty stomach.    [provider]  ALPRAZolam Duanne Moron) 0.25 MG tablet Take 0.25 mg by mouth at bedtime.     [provider]  benzocaine (ORAJEL) 10 % mucosal gel Use as directed in the mouth or throat 3 (three) times daily as needed for mouth pain. 01/05/17   Dessa Phi Chahn-Yang, DO  budesonide-formoterol (SYMBICORT) 160-4.5 MCG/ACT inhaler Inhale 2 puffs into the lungs 2 (two) times daily. 11/09/16 01/01/18  Mannam, Hart Robinsons, MD  ELIQUIS 2.5 MG TABS tablet TAKE 1 TABLET(2.5 MG) BY MOUTH TWICE DAILY 03/09/17   Croitoru, Mihai, MD  escitalopram (LEXAPRO) 10 MG tablet Take 10 mg by mouth daily.    [provider]  furosemide (LASIX) 40 MG tablet Take 1 tablet (40 mg total) by mouth daily as needed (for weight gain of greater than 2lbs). 02/21/17   Croitoru, Mihai, MD  HYDROcodone-acetaminophen (NORCO/VICODIN) 5-325 MG tablet Take 1-2 tablets by mouth every 4 (four) hours as needed for moderate pain. 01/05/17   Dessa Phi Chahn-Yang, DO  Ipratropium-Albuterol (COMBIVENT RESPIMAT) 20-100 MCG/ACT AERS respimat Inhale 1 puff into the lungs every 6 (six) hours as needed for wheezing or shortness of breath. 08/29/15   Barton Dubois, MD  metoprolol succinate (TOPROL-XL) 25 MG 24 hr tablet Take 25 mg by mouth daily.     [provider]  midodrine (PROAMATINE) 2.5 MG tablet Take 2.5 mg by mouth as needed (when systolic BP is lower than 100).     [provider]  mirtazapine (REMERON) 15 MG tablet Take 7.5 mg by mouth at bedtime.     [provider]  mometasone-formoterol (DULERA) 200-5 MCG/ACT AERO Inhale 2 puffs into the lungs 2 (two) times daily.    [provider]  potassium chloride SA (K-DUR,KLOR-CON) 20 MEQ tablet Take 20  mEq by mouth daily as needed (when taking Lasix).     [provider]  predniSONE (DELTASONE) 10 MG tablet Take 10 mg by mouth daily.     [provider]    Family History Family History  Problem Relation Age of Onset  . Stroke Mother     Social History Social History  Substance Use Topics  . Smoking status: Former Smoker    Packs/day: 0.50    Years: 60.00    Types: Cigarettes  . Smokeless tobacco: Never Used     Comment: quit 10/10/16--11/09/16  . Alcohol use No     Allergies   Patient has no known allergies.   Review of Systems Review of Systems  Constitutional: Positive for fatigue. Negative for fever.  HENT: Positive for congestion, ear pain and rhinorrhea. Negative for sore throat.   Eyes: Negative for visual disturbance.  Respiratory: Positive for cough and shortness of breath.   Cardiovascular: Negative for chest pain and leg swelling.  Gastrointestinal: Negative for abdominal pain, nausea and vomiting.  Genitourinary: Negative for difficulty urinating  and dysuria.  Musculoskeletal: Negative for back pain and neck pain.  Skin: Negative for rash.  Neurological: Negative for syncope, light-headedness and headaches.     Physical Exam Updated Vital Signs BP (!) 191/96 (BP Location: Left Arm)   Pulse 72   Temp 98.8 F (37.1 C) (Oral)   Resp (!) 34   SpO2 96%   Physical Exam  Constitutional: She is oriented to person, place, and time. She appears well-developed and well-nourished. No distress.  HENT:  Head: Normocephalic and atraumatic.  TM right obstructed by cerumen, pain with examination TM left small clear effusion, normal cone of light  Eyes: Conjunctivae and EOM are normal.  Neck: Normal range of motion. No JVD present.  Cardiovascular: Normal rate, regular rhythm, normal heart sounds and intact distal pulses.  Exam reveals no gallop and no friction rub.   No murmur heard. Pulmonary/Chest: Effort normal. No respiratory distress. She  has wheezes. She has rales (bibasilar).  Abdominal: Soft. She exhibits no distension. There is no tenderness. There is no guarding.  Musculoskeletal: She exhibits no edema or tenderness.  Neurological: She is alert and oriented to person, place, and time.  Skin: Skin is warm and dry. No rash noted. She is not diaphoretic. No erythema.  Nursing note and vitals reviewed.    ED Treatments / Results  Labs (all labs ordered are listed, but only abnormal results are displayed) Labs Reviewed  CBC WITH DIFFERENTIAL/PLATELET - Abnormal; Notable for the following:       Result Value   Hemoglobin 11.2 (*)    HCT 35.5 (*)    All other components within normal limits  COMPREHENSIVE METABOLIC PANEL - Abnormal; Notable for the following:    Glucose, Bld 151 (*)    Calcium 8.6 (*)    Albumin 3.3 (*)    All other components within normal limits  BRAIN NATRIURETIC PEPTIDE - Abnormal; Notable for the following:    B Natriuretic Peptide 706.7 (*)    All other components within normal limits  I-STAT TROPONIN, ED    EKG  EKG Interpretation  Date/Time:  Saturday March 18 2017 19:26:50 EDT Ventricular Rate:  71 PR Interval:    QRS Duration: 166 QT Interval:  436 QTC Calculation: 474 R Axis:   -87 Text Interpretation:  Atrial-ventricular dual-paced complexes No further analysis attempted due to paced rhythm Baseline wander in lead(s) V1 V4 V6 No significant change since last tracing Confirmed by Gareth Morgan [phone]) on 03/18/2017 8:07:11 PM       Radiology Dg Chest 2 View  Result Date: 03/18/2017 CLINICAL DATA:  Shortness of breath EXAM: CHEST  2 VIEW COMPARISON:  01/10/2017 FINDINGS: Right chest wall pacer device is noted with lead in the right atrial appendage and right ventricle. Mild cardiac enlargement. Small right pleural effusion is identified. Diffuse osteopenia. Compression deformities within the midthoracic spine are identified. When compared with the exam from 01/10/2017 the  fracture at T8 is new. IMPRESSION: 1. No acute cardiopulmonary abnormalities. 2. New T8 compression fracture. Electronically Signed   By: Kerby Moors M.D.   On: 03/18/2017 20:18    Procedures .Ear Cerumen Removal Date/Time: 03/19/2017 12:48 AM Performed by: Gareth Morgan Authorized by: Gareth Morgan   Consent:    Consent obtained:  Verbal   Consent given by:  Patient   Risks discussed:  Pain   Alternatives discussed:  No treatment Procedure details:    Location:  R ear   Procedure type: curette   Post-procedure details:  Patient tolerance of procedure:  Tolerated well, no immediate complications Comments:     Removal of cerumen, small amount remains however darkness of TM Sensitivity of external auditory canal with examination   (including critical care time)  Medications Ordered in ED Medications  ofloxacin (OCUFLOX) 0.3 % ophthalmic solution 5 drop (not administered)  albuterol (PROVENTIL) (2.5 MG/3ML) 0.083% nebulizer solution 5 mg (5 mg Nebulization Given 03/18/17 1952)  methylPREDNISolone sodium succinate (SOLU-MEDROL) 125 mg/2 mL injection 125 mg (125 mg Intravenous Given 03/18/17 2258)  ipratropium-albuterol (DUONEB) 0.5-2.5 (3) MG/3ML nebulizer solution 3 mL (3 mLs Nebulization Given 03/18/17 2258)  furosemide (LASIX) injection 40 mg (40 mg Intravenous Given 03/19/17 0045)     Initial Impression / Assessment and Plan / ED Course  I have reviewed the triage vital signs and the nursing notes.  Pertinent labs & imaging results that were available during my care of the patient were reviewed by me and considered in my medical decision making (see chart for details).    83 year old female with a history of CHF, COPD, sick sinus syndrome status post pacemaker placement, polymyalgia rheumatica who presents with concern for cough, congestion, dyspnea and orthopnea.  No sign of pneumonia on chest x-ray, and white blood cell count is within normal limits. Patient with both  wheezing and crackles on exam, elevated BNP, and suspect combination of COPD and CHF as etiology of symptoms. Given no asymmetric leg swelling, other explanation for dyspnea in setting of congestion and ear pain, have low suspicion for pulmonary embolus. Suspect patient most likely with a viral URI or allergies which has exacerbated her COPD in addition to mild fluid overload.  She has no prior hx of hypoxia, however saturations go to 88 on evaluation here and was noted to have hypoxia on EMS arrival.   Given lasix 40mg , solumedrol 125mg , duonebs x 2. Will admit for continued evaluation and treatment of COPD and CHF with new O2 requirement.  Patient also with ear pain, sensitivity on exam and cerumen impaction. Cerumen removed under visualization without complication, however exam after shows small amount of remaining cerumen with darkness and no clear tympanic membrane. Concern for possible perforation although area not fully visualized--and given patient's sensitivity on exam, possible otitis externa. Will cover for both with ofloxacin, recommend reeval next week of TM. If repeat eval shows concern for perforation will recommend follow up with ENT.    Final Clinical Impressions(s) / ED Diagnoses   Final diagnoses:  COPD exacerbation ([place])  Acute on chronic congestive heart failure, unspecified heart failure type ([place])  Perforation of right tympanic membrane    New Prescriptions New Prescriptions   No medications on file     Gareth Morgan, MD 03/19/17 [phone]

## 2017-03-18 NOTE — ED Triage Notes (Signed)
Pt BIB from home c/o SOB with exertion, congestion and lethargy. Hx of CHF. Pt is on Lasix as needed. O2 sats 88-89% upon EMS arrival, they placed her on 2L. Pt has a pacemaker.

## 2017-03-18 NOTE — ED Notes (Signed)
Writer ambulated with pt, O2 sats 89-92 on room air.

## 2017-03-19 ENCOUNTER — Encounter (HOSPITAL_COMMUNITY): Payer: Self-pay | Admitting: Internal Medicine

## 2017-03-19 DIAGNOSIS — Z66 Do not resuscitate: Secondary | ICD-10-CM | POA: Diagnosis present

## 2017-03-19 DIAGNOSIS — J9601 Acute respiratory failure with hypoxia: Secondary | ICD-10-CM | POA: Diagnosis not present

## 2017-03-19 DIAGNOSIS — F0390 Unspecified dementia without behavioral disturbance: Secondary | ICD-10-CM | POA: Diagnosis not present

## 2017-03-19 DIAGNOSIS — M419 Scoliosis, unspecified: Secondary | ICD-10-CM | POA: Diagnosis present

## 2017-03-19 DIAGNOSIS — Z950 Presence of cardiac pacemaker: Secondary | ICD-10-CM | POA: Diagnosis not present

## 2017-03-19 DIAGNOSIS — I110 Hypertensive heart disease with heart failure: Secondary | ICD-10-CM | POA: Diagnosis present

## 2017-03-19 DIAGNOSIS — Z823 Family history of stroke: Secondary | ICD-10-CM | POA: Diagnosis not present

## 2017-03-19 DIAGNOSIS — H9190 Unspecified hearing loss, unspecified ear: Secondary | ICD-10-CM | POA: Diagnosis present

## 2017-03-19 DIAGNOSIS — M4844XA Fatigue fracture of vertebra, thoracic region, initial encounter for fracture: Secondary | ICD-10-CM | POA: Diagnosis not present

## 2017-03-19 DIAGNOSIS — I10 Essential (primary) hypertension: Secondary | ICD-10-CM

## 2017-03-19 DIAGNOSIS — Z7901 Long term (current) use of anticoagulants: Secondary | ICD-10-CM

## 2017-03-19 DIAGNOSIS — I959 Hypotension, unspecified: Secondary | ICD-10-CM | POA: Diagnosis present

## 2017-03-19 DIAGNOSIS — Z7983 Long term (current) use of bisphosphonates: Secondary | ICD-10-CM | POA: Diagnosis not present

## 2017-03-19 DIAGNOSIS — J441 Chronic obstructive pulmonary disease with (acute) exacerbation: Secondary | ICD-10-CM

## 2017-03-19 DIAGNOSIS — Z7952 Long term (current) use of systemic steroids: Secondary | ICD-10-CM | POA: Diagnosis not present

## 2017-03-19 DIAGNOSIS — M353 Polymyalgia rheumatica: Secondary | ICD-10-CM | POA: Diagnosis present

## 2017-03-19 DIAGNOSIS — G8929 Other chronic pain: Secondary | ICD-10-CM | POA: Diagnosis present

## 2017-03-19 DIAGNOSIS — I5032 Chronic diastolic (congestive) heart failure: Secondary | ICD-10-CM | POA: Diagnosis present

## 2017-03-19 DIAGNOSIS — H6121 Impacted cerumen, right ear: Secondary | ICD-10-CM | POA: Diagnosis present

## 2017-03-19 DIAGNOSIS — M3313 Other dermatomyositis without myopathy: Secondary | ICD-10-CM | POA: Diagnosis present

## 2017-03-19 DIAGNOSIS — J9621 Acute and chronic respiratory failure with hypoxia: Secondary | ICD-10-CM | POA: Diagnosis present

## 2017-03-19 DIAGNOSIS — Z87891 Personal history of nicotine dependence: Secondary | ICD-10-CM | POA: Diagnosis not present

## 2017-03-19 DIAGNOSIS — I480 Paroxysmal atrial fibrillation: Secondary | ICD-10-CM | POA: Diagnosis not present

## 2017-03-19 DIAGNOSIS — M40204 Unspecified kyphosis, thoracic region: Secondary | ICD-10-CM | POA: Diagnosis present

## 2017-03-19 DIAGNOSIS — M4854XA Collapsed vertebra, not elsewhere classified, thoracic region, initial encounter for fracture: Secondary | ICD-10-CM | POA: Diagnosis present

## 2017-03-19 LAB — BASIC METABOLIC PANEL
ANION GAP: 12 (ref 5–15)
BUN: 16 mg/dL (ref 6–20)
CO2: 26 mmol/L (ref 22–32)
Calcium: 8.6 mg/dL — ABNORMAL LOW (ref 8.9–10.3)
Chloride: 101 mmol/L (ref 101–111)
Creatinine, Ser: 0.74 mg/dL (ref 0.44–1.00)
GFR calc Af Amer: >60 mL/min (ref >60)
GFR calc non Af Amer: >60 mL/min (ref >60)
GLUCOSE: 234 mg/dL — AB (ref 65–99)
POTASSIUM: 3.6 mmol/L (ref 3.5–5.1)
Sodium: 139 mmol/L (ref 135–145)

## 2017-03-19 LAB — CBC
HCT: 37.9 % (ref 36.0–46.0)
HEMOGLOBIN: 11.9 g/dL — AB (ref 12.0–15.0)
MCH: 28.7 pg (ref 26.0–34.0)
MCHC: 31.4 g/dL (ref 30.0–36.0)
MCV: 91.3 fL (ref 78.0–100.0)
Platelets: 284 10*3/uL (ref 150–400)
RBC: 4.15 MIL/uL (ref 3.87–5.11)
RDW: 15.1 % (ref 11.5–15.5)
WBC: 8 10*3/uL (ref 4.0–10.5)

## 2017-03-19 MED ORDER — MIRTAZAPINE 15 MG PO TABS
7.5000 mg | ORAL_TABLET | Freq: Every day | ORAL | Status: DC
  Administered 2017-03-19: 21:00:00 7.5 mg via ORAL
  Filled 2017-03-19: qty 1

## 2017-03-19 MED ORDER — OFLOXACIN 0.3 % OP SOLN
5.0000 [drp] | Freq: Every day | OPHTHALMIC | Status: DC
  Administered 2017-03-19: 5 [drp] via OTIC
  Filled 2017-03-19: qty 5

## 2017-03-19 MED ORDER — IPRATROPIUM-ALBUTEROL 0.5-2.5 (3) MG/3ML IN SOLN: 3.0000 mL | Freq: Four times a day (QID) | RESPIRATORY_TRACT | Status: DC

## 2017-03-19 MED ORDER — HYDROCODONE-ACETAMINOPHEN 5-325 MG PO TABS
1.0000 | ORAL_TABLET | ORAL | Status: DC | PRN
  Administered 2017-03-19: 1 via ORAL
  Filled 2017-03-19 (×2): qty 1

## 2017-03-19 MED ORDER — DOXYCYCLINE HYCLATE 100 MG PO TABS
100.0000 mg | ORAL_TABLET | Freq: Two times a day (BID) | ORAL | Status: DC
  Administered 2017-03-19 – 2017-03-20 (×2): 100 mg via ORAL
  Filled 2017-03-19 (×2): qty 1

## 2017-03-19 MED ORDER — HYDRALAZINE HCL 20 MG/ML IJ SOLN: 5.0000 mg | INTRAMUSCULAR | Status: DC | PRN

## 2017-03-19 MED ORDER — METHYLPREDNISOLONE SODIUM SUCC 125 MG IJ SOLR
60.0000 mg | Freq: Two times a day (BID) | INTRAMUSCULAR | Status: DC
  Administered 2017-03-20: 07:00:00 60 mg via INTRAVENOUS
  Filled 2017-03-19: qty 2

## 2017-03-19 MED ORDER — ACETAMINOPHEN 650 MG RE SUPP: 650.0000 mg | Freq: Four times a day (QID) | RECTAL | Status: DC | PRN

## 2017-03-19 MED ORDER — MOMETASONE FURO-FORMOTEROL FUM 200-5 MCG/ACT IN AERO
2.0000 | INHALATION_SPRAY | Freq: Two times a day (BID) | RESPIRATORY_TRACT | Status: DC
  Administered 2017-03-19 – 2017-03-20 (×2): 2 via RESPIRATORY_TRACT
  Filled 2017-03-19: qty 8.8

## 2017-03-19 MED ORDER — DOXYCYCLINE HYCLATE 100 MG IV SOLR
100.0000 mg | Freq: Two times a day (BID) | INTRAVENOUS | Status: DC
  Administered 2017-03-19 (×2): 100 mg via INTRAVENOUS
  Filled 2017-03-19 (×3): qty 100

## 2017-03-19 MED ORDER — OFLOXACIN 0.3 % OP SOLN: 10.0000 [drp] | Freq: Two times a day (BID) | OPHTHALMIC | Status: DC

## 2017-03-19 MED ORDER — IPRATROPIUM-ALBUTEROL 0.5-2.5 (3) MG/3ML IN SOLN
3.0000 mL | Freq: Three times a day (TID) | RESPIRATORY_TRACT | Status: DC
  Administered 2017-03-19 – 2017-03-20 (×3): 3 mL via RESPIRATORY_TRACT
  Filled 2017-03-19 (×4): qty 3

## 2017-03-19 MED ORDER — ACETAMINOPHEN 325 MG PO TABS: 650.0000 mg | ORAL_TABLET | Freq: Four times a day (QID) | ORAL | Status: DC | PRN

## 2017-03-19 MED ORDER — ESCITALOPRAM OXALATE 10 MG PO TABS
10.0000 mg | ORAL_TABLET | Freq: Every day | ORAL | Status: DC
  Administered 2017-03-19 – 2017-03-20 (×2): 10 mg via ORAL
  Filled 2017-03-19 (×2): qty 1

## 2017-03-19 MED ORDER — ONDANSETRON HCL 4 MG/2ML IJ SOLN: 4.0000 mg | Freq: Four times a day (QID) | INTRAMUSCULAR | Status: DC | PRN

## 2017-03-19 MED ORDER — DOCUSATE SODIUM 100 MG PO CAPS
100.0000 mg | ORAL_CAPSULE | Freq: Two times a day (BID) | ORAL | Status: DC
  Administered 2017-03-19 – 2017-03-20 (×3): 100 mg via ORAL
  Filled 2017-03-19 (×3): qty 1

## 2017-03-19 MED ORDER — ALPRAZOLAM 0.25 MG PO TABS
0.2500 mg | ORAL_TABLET | Freq: Every day | ORAL | Status: DC
  Administered 2017-03-19: 0.25 mg via ORAL
  Filled 2017-03-19: qty 1

## 2017-03-19 MED ORDER — METOPROLOL SUCCINATE ER 25 MG PO TB24
25.0000 mg | ORAL_TABLET | Freq: Every day | ORAL | Status: DC
Start: 2017-03-19 — End: 2017-03-20
  Administered 2017-03-19 – 2017-03-20 (×2): 25 mg via ORAL
  Filled 2017-03-19 (×3): qty 1

## 2017-03-19 MED ORDER — APIXABAN 2.5 MG PO TABS
2.5000 mg | ORAL_TABLET | Freq: Two times a day (BID) | ORAL | Status: DC
  Administered 2017-03-19 – 2017-03-20 (×3): 2.5 mg via ORAL
  Filled 2017-03-19 (×5): qty 1

## 2017-03-19 MED ORDER — ALBUTEROL SULFATE (2.5 MG/3ML) 0.083% IN NEBU: 2.5000 mg | INHALATION_SOLUTION | RESPIRATORY_TRACT | Status: DC | PRN

## 2017-03-19 MED ORDER — ONDANSETRON HCL 4 MG PO TABS: 4.0000 mg | ORAL_TABLET | Freq: Four times a day (QID) | ORAL | Status: DC | PRN

## 2017-03-19 NOTE — H&P (Signed)
History and Physical    [name]:[id number] DOB: [birth year] DOA: 03/18/2017  PCP: Lajean Manes, MD Consultants:  [facility] - pulmonology (appt this Friday); [facility] - cardiology Patient coming from:  Home - lives in [place]; [place]: Daughter, [phone]  Chief Complaint: SOB  HPI: [name] is a 83 y.o. female with medical history significant of A Fib on Eliquis, complete heart block s/p pacemaker, CHF, COPD, polymyalgia rheumatica on chronic steroids, HTN presenting with SOB.  They have been out of town.  Her allergies were bothering her when they were leaving town.  This AM, she was wheezing and coughing and gasping for breath so they came home.  They went to her house and her pulse ox was dropping to 87% and HR went to 110.  ?sick contacts - lives in [place].  +URI symptoms.  Right ear was stopped up and they dug out wax today.   It felt stopped up and was hard to hear out of this weekend. Coughing up white phlegm.  +wheezing.  Sleeps on 1 pillow, no change.  No fevers.  No PND.  She has not mentioned her back pain in a couple of weeks. She previously had kyphoplasty and had improvement but ongoing pain since July.  She has occasional left knee and ankle chronic pain but currently reports no recent back pain.   ED Course: CXR negative, normal WBC count.  Wheezing and crackles on exam, elevated BNP - suspect both COPD and CHF.  Sats 88%.  Given Lasix 40 mg IV, Solumedrol 125 mg IV, Duonebs x 2.  Ear pain, cerumen removal - concern for perforation but not seen, concern for otitis externa.  Review of Systems: As per HPI; otherwise review of systems reviewed and negative.  +dementia, mild but worsening.  Ambulatory Status:  Ambulates with a walker most of the time  Past Medical History:  Diagnosis Date  . CHB (complete heart block) ([place]) 07/13/2013   Pacemaker dependent  . CHF (congestive heart failure) ([place])   . COPD (chronic obstructive pulmonary disease)  ([place])   . DM (dermatomyositis)   . Orthostatic hypotension   . Pacemaker 07/13/2013   Her original pacemaker and the current leads were implanted in 1992. She has had 2 generator change out, most recently in 2010. Her device is a Buyer, retail 2110 non RF dual-chamber pacemaker with a battery longevity estimated at about 7 years. The atrial lead is a St. Jude [id number] and the ventricular lead was a Biotronik [id number].   [name] Paroxysmal atrial fibrillation ([place]) 09/04/2014   on Eliquis  . Polymyalgia rheumatica ([place])   . Scoliosis   . SSS (sick sinus syndrome) ([place]) 07/13/2013  . Syncope   . Systemic hypertension   . Thoracic kyphosis     Past Surgical History:  Procedure Laterality Date  . IR KYPHO THORACIC WITH BONE BIOPSY  01/04/2017  . NM MYOCAR PERF WALL MOTION  09/13/2011   Low risk  . PERMANENT PACEMAKER GENERATOR CHANGE  03/27/2009   [employer]  . US ECHOCARDIOGRAPHY  03/28/2012   Mod LAE,mild MR,aortic sclerosis w/mod AI,mod. TR,mild PI,Stage I diastolic dysfunction  . VIDEO BRONCHOSCOPY Bilateral 10/07/2016   Procedure: VIDEO BRONCHOSCOPY WITH FLUORO;  Surgeon: Marshell Garfinkel, MD;  Location: [facility];  Service: Cardiopulmonary;  Laterality: Bilateral;    Social History   Social History  . Marital status: Married    Spouse name: N/A  . Number of children: N/A  . Years of education: N/A  Occupational History  . retired    Social History Main Topics  . Smoking status: Former Smoker    Packs/day: 0.50    Years: 60.00    Types: Cigarettes  . Smokeless tobacco: Never Used     Comment: quit 12/2016  . Alcohol use No  . Drug use: No  . Sexual activity: Not on file   Other Topics Concern  . Not on file   Social History Narrative  . No narrative on file    No Known Allergies  Family History  Problem Relation Age of Onset  . Stroke Mother     Prior to Admission medications   Medication Sig Start Date End Date Taking? Authorizing Provider  alendronate (FOSAMAX) 70 MG  tablet Take 70 mg by mouth every Saturday. Take with a full glass of water on an empty stomach.    [provider]  ALPRAZolam Duanne Moron) 0.25 MG tablet Take 0.25 mg by mouth at bedtime.     [provider]  benzocaine (ORAJEL) 10 % mucosal gel Use as directed in the mouth or throat 3 (three) times daily as needed for mouth pain. 01/05/17   Dessa Phi Chahn-Yang, DO  budesonide-formoterol (SYMBICORT) 160-4.5 MCG/ACT inhaler Inhale 2 puffs into the lungs 2 (two) times daily. 11/09/16 01/01/18  Mannam, Hart Robinsons, MD  ELIQUIS 2.5 MG TABS tablet TAKE 1 TABLET(2.5 MG) BY MOUTH TWICE DAILY 03/09/17   Croitoru, Mihai, MD  escitalopram (LEXAPRO) 10 MG tablet Take 10 mg by mouth daily.    [provider]  furosemide (LASIX) 40 MG tablet Take 1 tablet (40 mg total) by mouth daily as needed (for weight gain of greater than 2lbs). 02/21/17   Croitoru, Mihai, MD  HYDROcodone-acetaminophen (NORCO/VICODIN) 5-325 MG tablet Take 1-2 tablets by mouth every 4 (four) hours as needed for moderate pain. 01/05/17   Dessa Phi Chahn-Yang, DO  Ipratropium-Albuterol (COMBIVENT RESPIMAT) 20-100 MCG/ACT AERS respimat Inhale 1 puff into the lungs every 6 (six) hours as needed for wheezing or shortness of breath. 08/29/15   Barton Dubois, MD  metoprolol succinate (TOPROL-XL) 25 MG 24 hr tablet Take 25 mg by mouth daily.     [provider]  midodrine (PROAMATINE) 2.5 MG tablet Take 2.5 mg by mouth as needed (when systolic BP is lower than 100).     [provider]  mirtazapine (REMERON) 15 MG tablet Take 7.5 mg by mouth at bedtime.     [provider]  mometasone-formoterol (DULERA) 200-5 MCG/ACT AERO Inhale 2 puffs into the lungs 2 (two) times daily.    [provider]  potassium chloride SA (K-DUR,KLOR-CON) 20 MEQ tablet Take 20 mEq by mouth daily as needed (when taking Lasix).     [provider]  predniSONE (DELTASONE) 10 MG tablet Take 10 mg by mouth daily.      [provider]    Physical Exam: Vitals:   03/18/17 1926 03/18/17 2200 03/19/17 0049  BP: (!) 167/83 (!) 177/72 (!) 191/96  Pulse: 72 70 72  Resp: 20 (!) 30 (!) 34  Temp: 98.8 F (37.1 C)    TempSrc: Oral    SpO2: 92% 93% 96%     General:  Appears calm and comfortable and is NAD Eyes:  PERRL, EOMI, normal lids, iris ENT:  Hard of hearing, normal lips & tongue, mmm.  Right TM without apparent perforation, no appreciated canal erythema, patient comfortable now that wax has been removed. Neck:  no LAD, masses or thyromegaly; no carotid bruits  Cardiovascular:  RRR, no m/r/g. No LE edema.  Respiratory:  Diffuse wheezing with moderate air movement.  No crackles.  Increased respiratory effort with exertion. Abdomen:  soft, NT, ND, NABS Back:   normal alignment, no CVAT Skin:  no rash or induration seen on limited exam Musculoskeletal:  grossly normal tone BUE/BLE, good ROM, no bony abnormality Psychiatric:  grossly normal mood and affect, speech fluent and appropriate, AOx3 but repeated asks the same questions and seems unsure about things she has discussed before. Neurologic:  CN 2-12 grossly intact, moves all extremities in coordinated fashion, sensation intact    Radiological Exams on Admission: Dg Chest 2 View  Result Date: 03/18/2017 CLINICAL DATA:  Shortness of breath EXAM: CHEST  2 VIEW COMPARISON:  01/10/2017 FINDINGS: Right chest wall pacer device is noted with lead in the right atrial appendage and right ventricle. Mild cardiac enlargement. Small right pleural effusion is identified. Diffuse osteopenia. Compression deformities within the midthoracic spine are identified. When compared with the exam from 01/10/2017 the fracture at T8 is new. IMPRESSION: 1. No acute cardiopulmonary abnormalities. 2. New T8 compression fracture. Electronically Signed   By: Kerby Moors M.D.   On: 03/18/2017 20:18    EKG: Independently reviewed.  AV dual paced with rate  71;NSCSLT   Labs on Admission: I have personally reviewed the available labs and imaging studies at the time of the admission.  Pertinent labs:   Glucose 151 BNP 706.7, relatively stable compared to prior Troponin 0.02 Hgb 11.2, previously 12.4 in 7/18   Assessment/Plan Principal Problem:   Acute hypoxemic respiratory failure (HCC) Active Problems:   Paroxysmal atrial fibrillation (HCC)   COPD exacerbation (HCC)   Anticoagulant long-term use   Benign essential HTN   Stress fracture of thoracic vertebra   Dementia    Acute on chronic respiratory failure associated with a COPD exacerbation -Patient's shortness of breath and productive cough are most likely caused by acute COPD exacerbation.  -She has URI symptoms, wheezing, and appears to have infectious cause of symptoms. -Low suspicion for CHF component at this time despite (chronically) elevated BNP. -She does not have fever or leukocytosis. Chest x-ray is not consistent with pneumonia -will admit patient - it seems likely that he will need several days of hospitalization to show sufficient improvement for discharge. -Nebulizers: scheduled Duoneb and prn albuterol -Solu-Medrol 60 mg IV BID  -Doxycycline (has 2-3/3 cardinal symptoms)  -Dr. Billy Fischer raised concern for possible right perforated TM and/or otitis externa.  She had waxed cleaned out of her ear while in the ER and reports improved symptoms.  I do not see evidence of OE or perforated TM and so will not treat at this time.  Afib on Eliquis -Rate controlled  -Has AV pacer -Continue Eliquis  HTN -Patient actually usually needs Midodrine for hypotension and so this is unusual for her -She does take Toprol daily. -Will cover with hydralazine PRN - current HTN may be associated with acute illness and so it may not be necessary to add/change medications at this time.  Stress fracture -She does not report back pain at this time despite apparent new fracture at T8.    -Will follow.  Dementia -Appears to have developing early dementia. -Briefly discussed with patient/daughter. -Suggest outpatient f/u for further evaluation.   DVT prophylaxis: Eliquis Code Status:  DNR - confirmed with patient/family Family Communication: Daughter present throughout evaluation  Disposition Plan:  Back to Assisted Living once clinically improved Consults called: Cm, SW, RT, Nutrition,  PT, OT Admission status: Admit - It is my clinical opinion that admission to INPATIENT is reasonable and necessary because this patient will require at least 2 midnights in the hospital to treat this condition based on the medical complexity of the problems presented.  Given the aforementioned information, the predictability of an adverse outcome is felt to be significant.    Karmen Bongo MD [facility]  If note is complete, please contact covering daytime or nighttime physician. [email or web address] Password [email or web address]  03/19/2017, 1:41 AM

## 2017-03-19 NOTE — ED Notes (Signed)
Pt is asleep and appears comfortable.

## 2017-03-19 NOTE — ED Notes (Signed)
20 minute timer started 

## 2017-03-19 NOTE — Evaluation (Signed)
Physical Therapy Evaluation Patient Details Name: [name] MRN: [id number] DOB: 07-May-1931 Today's Date: 03/19/2017   History of Present Illness  Pt admitted through ED 2* SOB 2* COPD exac and acute on chronic CHF.  Pt Xray also showing new T-8 compression fx.  Pt with hx of T-9 comp fx s/p kyphoplasty, DM, pacemaker, CHF, CHB, and SSS.  Clinical Impression  Pt admitted as above and presenting with functional mobility limitations 2* generalized weakness and ambulatory balance deficits.  Pt would benefit from continued PT/OT intervention upon return to ALF    Follow Up Recommendations Home health PT (Pt with PT/OT at facility prior to this admit)    Equipment Recommendations  None recommended by PT    Recommendations for Other Services OT consult     Precautions / Restrictions Precautions Precautions: Fall Restrictions Weight Bearing Restrictions: No      Mobility  Bed Mobility Overal bed mobility: Needs Assistance Bed Mobility: Supine to Sit     Supine to sit: Supervision     General bed mobility comments: Pt unassisted supine to sit  Transfers Overall transfer level: Needs assistance Equipment used: None Transfers: Sit to/from Stand Sit to Stand: Min guard         General transfer comment: min guard to steady with initial standing  Ambulation/Gait Ambulation/Gait assistance: Min assist;Min guard Ambulation Distance (Feet): 350 Feet (and 15' into bathroom) Assistive device: 1 person hand held assist Gait Pattern/deviations: Step-through pattern;Decreased step length - right;Decreased step length - left;Shuffle;Wide base of support Gait velocity: decr Gait velocity interpretation: Below normal speed for age/gender General Gait Details: Pt ambulating sans AD and with mild general instability but with no LOB.  Pt on RA and maintained sats at 92% or higher.  Stairs            Wheelchair Mobility    Modified Rankin (Stroke Patients Only)        Balance Overall balance assessment: Needs assistance Sitting-balance support: No upper extremity supported;Feet supported Sitting balance-Leahy Scale: Good     Standing balance support: No upper extremity supported Standing balance-Leahy Scale: Fair                               Pertinent Vitals/Pain Pain Assessment: No/denies pain    Home Living Family/patient expects to be discharged to:: Assisted living               Home Equipment: Walker - 2 wheels;Cane - single point;Bedside commode Additional Comments: daughter lives nearby    Prior Function Level of Independence: Independent;Independent with assistive device(s)         Comments: Dtr reports pt is primarily using RW.  Pt states she is using as needed until she is "completely better"     Hand Dominance        Extremity/Trunk Assessment   Upper Extremity Assessment Upper Extremity Assessment: Generalized weakness    Lower Extremity Assessment Lower Extremity Assessment: Generalized weakness    Cervical / Trunk Assessment Cervical / Trunk Assessment: Kyphotic  Communication   Communication: No difficulties  Cognition Arousal/Alertness: Awake/alert Behavior During Therapy: WFL for tasks assessed/performed;Impulsive Overall Cognitive Status: Within Functional Limits for tasks assessed                                        General Comments  Exercises     Assessment/Plan    PT Assessment Patient needs continued PT services  PT Problem List Decreased strength;Decreased activity tolerance;Decreased balance;Decreased mobility;Decreased knowledge of use of DME       PT Treatment Interventions DME instruction;Gait training;Functional mobility training;Therapeutic exercise;Therapeutic activities;Balance training;Patient/family education    PT Goals (Current goals can be found in the Care Plan section)  Acute Rehab PT Goals Patient Stated Goal: Regain IND PT  Goal Formulation: With patient Time For Goal Achievement: 04/02/17 Potential to Achieve Goals: Good    Frequency Min 3X/week   Barriers to discharge        Co-evaluation               AM-PAC PT "6 Clicks" Daily Activity  Outcome Measure Difficulty turning over in bed (including adjusting bedclothes, sheets and blankets)?: A Little Difficulty moving from lying on back to sitting on the side of the bed? : A Little Difficulty sitting down on and standing up from a chair with arms (e.g., wheelchair, bedside commode, etc,.)?: A Little Help needed moving to and from a bed to chair (including a wheelchair)?: A Little Help needed walking in hospital room?: A Little Help needed climbing 3-5 steps with a railing? : A Little 6 Click Score: 18    End of Session Equipment Utilized During Treatment: Gait belt Activity Tolerance: Patient tolerated treatment well;Patient limited by fatigue Patient left: in chair;with call bell/phone within reach;with chair alarm set Nurse Communication: Mobility status PT Visit Diagnosis: Unsteadiness on feet (R26.81);Difficulty in walking, not elsewhere classified (R26.2);Muscle weakness (generalized) (M62.81)    Time: 1351-1420 PT Time Calculation (min) (ACUTE ONLY): 29 min   Charges:   PT Evaluation $PT Eval Low Complexity: 1 Low PT Treatments $Gait Training: 8-22 mins   PT G Codes:        Pg [phone]   Tarry Blayney 03/19/2017, 3:22 PM

## 2017-03-19 NOTE — Progress Notes (Signed)
  PROGRESS NOTE  Patient admitted earlier this morning. See H&P. [name] is a 83 y.o. female with medical history significant of A Fib on Eliquis, complete heart block s/p pacemaker, CHF, COPD, polymyalgia rheumatica on chronic steroids, HTN presenting with SOB. She states that she started getting a little bit short of breath as well as thought her allergies are bothering her. Yesterday morning, she was noted to be wheezing, coughing and gasping for breath. Her pulse ox dropped down to 87% and heart rate up to 110. She presented to the emergency department. Chest x-ray was unremarkable, she had wheezes and crackles on examination. She was given IV Lasix, Solu-Medrol, nebs.  On examination, patient reports that she is doing much better. She denies any back pain. Lung sounds are diminished with mild wheezing. She appears euvolemic on examination.  Treat for COPD exacerbation with nebulizer, steroids, doxycycline BP stable today    Dessa Phi, DO [facility] [email or web address] Password [email or web address] 03/19/2017, 2:17 PM

## 2017-03-19 NOTE — ED Notes (Signed)
ED TO INPATIENT HANDOFF REPORT  Name/Age/Gender [name] 83 y.o. female  Code Status    Code Status Orders        Start     Ordered   03/19/17 0315  Do not attempt resuscitation (DNR)  Continuous    Question Answer Comment  In the event of cardiac or respiratory ARREST Do not call a "code blue"   In the event of cardiac or respiratory ARREST Do not perform Intubation, CPR, defibrillation or ACLS   In the event of cardiac or respiratory ARREST Use medication by any route, position, wound care, and other measures to relive pain and suffering. May use oxygen, suction and manual treatment of airway obstruction as needed for comfort.      03/19/17 0314    Code Status History    Date Active Date Inactive Code Status Order ID Comments User Context   01/01/2017  5:17 PM 01/05/2017  6:22 PM Full Code [id number]  Shon Millet, DO Inpatient   10/11/2016  3:19 AM 10/13/2016  6:29 PM DNR [id number]  Toy Baker, MD Inpatient   10/05/2016 12:41 PM 10/10/2016  6:15 PM Full Code [id number]  Debbe Odea, MD ED   07/15/2016  3:28 PM 07/18/2016  4:25 PM Full Code [id number]  Patrecia Pour, MD Inpatient   10/31/2015 10:43 PM 11/02/2015  4:39 PM Full Code [id number]  Theressa Millard, MD Inpatient   08/26/2015  3:54 AM 08/29/2015  5:52 PM Full Code [id number]  Theressa Millard, MD Inpatient   08/26/2015  1:53 AM 08/26/2015  3:54 AM Full Code [id number]  Theressa Millard, MD ED   06/07/2015  5:31 AM 06/10/2015  8:17 PM Full Code [id number]  Etta Quill, DO ED      Home/SNF/Other Home  Chief Complaint SOB  Level of Care/Admitting Diagnosis ED Disposition    ED Disposition Condition [place]: [facility] [[id number]]  Level of Care: Med-Surg [16]  Diagnosis: COPD exacerbation [facility]) [[id number]]  Admitting Physician: Karmen Bongo [[id number]]  Attending Physician: Karmen Bongo [[id number]]  Estimated length of stay: 3 - 4 days  Certification:: I  certify this patient will need inpatient services for at least 2 midnights  PT Class (Do Not Modify): Inpatient [101]  PT Acc Code (Do Not Modify): Private [1]       Medical History Past Medical History:  Diagnosis Date  . CHB (complete heart block) ([place]) 07/13/2013   Pacemaker dependent  . CHF (congestive heart failure) ([place])   . COPD (chronic obstructive pulmonary disease) ([place])   . DM (dermatomyositis)   . Orthostatic hypotension   . Pacemaker 07/13/2013   Her original pacemaker and the current leads were implanted in 1992. She has had 2 generator change out, most recently in 2010. Her device is a Buyer, retail 2110 non RF dual-chamber pacemaker with a battery longevity estimated at about 7 years. The atrial lead is a St. Jude [id number] and the ventricular lead was a Biotronik [id number].   [name] Paroxysmal atrial fibrillation ([place]) 09/04/2014   on Eliquis  . Polymyalgia rheumatica ([place])   . Scoliosis   . SSS (sick sinus syndrome) ([place]) 07/13/2013  . Syncope   . Systemic hypertension   . Thoracic kyphosis     Allergies No Known Allergies  IV Location/Drains/Wounds Patient Lines/Drains/Airways Status   Active Line/Drains/Airways    Name:   Placement date:   Placement time:   Site:   Days:  Peripheral IV 03/18/17 Left Hand  03/18/17    1900    Hand    1          Labs/Imaging Results for orders placed or performed during the hospital encounter of 03/18/17 (from the past 48 hour(s))  CBC with Differential     Status: Abnormal   Collection Time: 03/18/17  9:42 PM  Result Value Ref Range   WBC 9.8 4.0 - 10.5 K/uL   RBC 3.87 3.87 - 5.11 MIL/uL   Hemoglobin 11.2 (L) 12.0 - 15.0 g/dL   HCT 35.5 (L) 36.0 - 46.0 %   MCV 91.7 78.0 - 100.0 fL   MCH 28.9 26.0 - 34.0 pg   MCHC 31.5 30.0 - 36.0 g/dL   RDW 15.3 11.5 - 15.5 %   Platelets 289 150 - 400 K/uL   Neutrophils Relative % 78 %   Neutro Abs 7.7 1.7 - 7.7 K/uL   Lymphocytes Relative 15 %   Lymphs Abs 1.4 0.7 - 4.0 K/uL    Monocytes Relative 7 %   Monocytes Absolute 0.7 0.1 - 1.0 K/uL   Eosinophils Relative 0 %   Eosinophils Absolute 0.0 0.0 - 0.7 K/uL   Basophils Relative 0 %   Basophils Absolute 0.0 0.0 - 0.1 K/uL  Comprehensive metabolic panel     Status: Abnormal   Collection Time: 03/18/17  9:42 PM  Result Value Ref Range   Sodium 142 135 - 145 mmol/L   Potassium 4.1 3.5 - 5.1 mmol/L   Chloride 108 101 - 111 mmol/L   CO2 24 22 - 32 mmol/L   Glucose, Bld 151 (H) 65 - 99 mg/dL   BUN 13 6 - 20 mg/dL   Creatinine, Ser 0.72 0.44 - 1.00 mg/dL   Calcium 8.6 (L) 8.9 - 10.3 mg/dL   Total Protein 6.5 6.5 - 8.1 g/dL   Albumin 3.3 (L) 3.5 - 5.0 g/dL   AST 28 15 - 41 U/L   ALT 17 14 - 54 U/L   Alkaline Phosphatase 85 38 - 126 U/L   Total Bilirubin 0.8 0.3 - 1.2 mg/dL   GFR calc non Af Amer >60 >60 mL/min   GFR calc Af Amer >60 >60 mL/min    Comment: (NOTE) The eGFR has been calculated using the CKD EPI equation. This calculation has not been validated in all clinical situations. eGFR's persistently <60 mL/min signify possible Chronic Kidney Disease.    Anion gap 10 5 - 15  Brain natriuretic peptide     Status: Abnormal   Collection Time: 03/18/17  9:42 PM  Result Value Ref Range   B Natriuretic Peptide 706.7 (H) 0.0 - 100.0 pg/mL  I-Stat Troponin, ED (not at [facility])     Status: None   Collection Time: 03/18/17 10:20 PM  Result Value Ref Range   Troponin i, poc 0.02 0.00 - 0.08 ng/mL   Comment 3            Comment: Due to the release kinetics of cTnI, a negative result within the first hours of the onset of symptoms does not rule out myocardial infarction with certainty. If myocardial infarction is still suspected, repeat the test at appropriate intervals.   Basic metabolic panel     Status: Abnormal   Collection Time: 03/19/17  5:11 AM  Result Value Ref Range   Sodium 139 135 - 145 mmol/L   Potassium 3.6 3.5 - 5.1 mmol/L   Chloride 101 101 - 111 mmol/L   CO2  26 22 - 32 mmol/L   Glucose, Bld  234 (H) 65 - 99 mg/dL   BUN 16 6 - 20 mg/dL   Creatinine, Ser 0.74 0.44 - 1.00 mg/dL   Calcium 8.6 (L) 8.9 - 10.3 mg/dL   GFR calc non Af Amer >60 >60 mL/min   GFR calc Af Amer >60 >60 mL/min    Comment: (NOTE) The eGFR has been calculated using the CKD EPI equation. This calculation has not been validated in all clinical situations. eGFR's persistently <60 mL/min signify possible Chronic Kidney Disease.    Anion gap 12 5 - 15  CBC     Status: Abnormal   Collection Time: 03/19/17  5:11 AM  Result Value Ref Range   WBC 8.0 4.0 - 10.5 K/uL   RBC 4.15 3.87 - 5.11 MIL/uL   Hemoglobin 11.9 (L) 12.0 - 15.0 g/dL   HCT 37.9 36.0 - 46.0 %   MCV 91.3 78.0 - 100.0 fL   MCH 28.7 26.0 - 34.0 pg   MCHC 31.4 30.0 - 36.0 g/dL   RDW 15.1 11.5 - 15.5 %   Platelets 284 150 - 400 K/uL   Dg Chest 2 View  Result Date: 03/18/2017 CLINICAL DATA:  Shortness of breath EXAM: CHEST  2 VIEW COMPARISON:  01/10/2017 FINDINGS: Right chest wall pacer device is noted with lead in the right atrial appendage and right ventricle. Mild cardiac enlargement. Small right pleural effusion is identified. Diffuse osteopenia. Compression deformities within the midthoracic spine are identified. When compared with the exam from 01/10/2017 the fracture at T8 is new. IMPRESSION: 1. No acute cardiopulmonary abnormalities. 2. New T8 compression fracture. Electronically Signed   By: Kerby Moors M.D.   On: 03/18/2017 20:18    Pending Labs Unresulted Labs    None      Vitals/Pain Today's Vitals   03/19/17 0330 03/19/17 0500 03/19/17 0530 03/19/17 0800  BP: (!) 141/59 137/89 139/89 (!) 139/99  Pulse: 71 66 70 74  Resp: '20 14 11 19  ' Temp:      TempSrc:      SpO2: 96% 96% 97% 96%  PainSc:        Isolation Precautions No active isolations  Medications Medications  apixaban (ELIQUIS) tablet 2.5 mg (2.5 mg Oral Not Given 03/19/17 0315)  HYDROcodone-acetaminophen (NORCO/VICODIN) 5-325 MG per tablet 1-2 tablet (not  administered)  mometasone-formoterol (DULERA) 200-5 MCG/ACT inhaler 2 puff (not administered)  escitalopram (LEXAPRO) tablet 10 mg (not administered)  mirtazapine (REMERON) tablet 7.5 mg (not administered)  metoprolol succinate (TOPROL-XL) 24 hr tablet 25 mg (not administered)  ALPRAZolam (XANAX) tablet 0.25 mg (not administered)  acetaminophen (TYLENOL) tablet 650 mg (not administered)    Or  acetaminophen (TYLENOL) suppository 650 mg (not administered)  docusate sodium (COLACE) capsule 100 mg (100 mg Oral Not Given 03/19/17 0315)  ondansetron (ZOFRAN) tablet 4 mg (not administered)    Or  ondansetron (ZOFRAN) injection 4 mg (not administered)  methylPREDNISolone sodium succinate (SOLU-MEDROL) 125 mg/2 mL injection 60 mg (not administered)  albuterol (PROVENTIL) (2.5 MG/3ML) 0.083% nebulizer solution 2.5 mg (not administered)  doxycycline (VIBRAMYCIN) 100 mg in dextrose 5 % 250 mL IVPB (0 mg Intravenous Stopped 03/19/17 0539)  hydrALAZINE (APRESOLINE) injection 5 mg (not administered)  ipratropium-albuterol (DUONEB) 0.5-2.5 (3) MG/3ML nebulizer solution 3 mL (not administered)  albuterol (PROVENTIL) (2.5 MG/3ML) 0.083% nebulizer solution 5 mg (5 mg Nebulization Given 03/18/17 1952)  methylPREDNISolone sodium succinate (SOLU-MEDROL) 125 mg/2 mL injection 125 mg (125 mg Intravenous Given 03/18/17  2258)  ipratropium-albuterol (DUONEB) 0.5-2.5 (3) MG/3ML nebulizer solution 3 mL (3 mLs Nebulization Given 03/18/17 2258)  furosemide (LASIX) injection 40 mg (40 mg Intravenous Given 03/19/17 0045)    Mobility walks with device

## 2017-03-20 LAB — BASIC METABOLIC PANEL
Anion gap: 6 (ref 5–15)
BUN: 16 mg/dL (ref 6–20)
CHLORIDE: 106 mmol/L (ref 101–111)
CO2: 31 mmol/L (ref 22–32)
CREATININE: 0.79 mg/dL (ref 0.44–1.00)
Calcium: 8.8 mg/dL — ABNORMAL LOW (ref 8.9–10.3)
GFR calc non Af Amer: >60 mL/min (ref >60)
Glucose, Bld: 95 mg/dL (ref 65–99)
POTASSIUM: 3.7 mmol/L (ref 3.5–5.1)
SODIUM: 143 mmol/L (ref 135–145)

## 2017-03-20 LAB — CBC WITH DIFFERENTIAL/PLATELET
Basophils Absolute: 0 10*3/uL (ref 0.0–0.1)
Basophils Relative: 0 %
Eosinophils Absolute: 0 10*3/uL (ref 0.0–0.7)
Eosinophils Relative: 0 %
HCT: 34.8 % — ABNORMAL LOW (ref 36.0–46.0)
HEMOGLOBIN: 10.7 g/dL — AB (ref 12.0–15.0)
LYMPHS ABS: 1.5 10*3/uL (ref 0.7–4.0)
Lymphocytes Relative: 15 %
MCH: 27.7 pg (ref 26.0–34.0)
MCHC: 30.7 g/dL (ref 30.0–36.0)
MCV: 90.2 fL (ref 78.0–100.0)
MONOS PCT: 10 %
Monocytes Absolute: 1 10*3/uL (ref 0.1–1.0)
NEUTROS ABS: 7.7 10*3/uL (ref 1.7–7.7)
NEUTROS PCT: 75 %
Platelets: 300 10*3/uL (ref 150–400)
RBC: 3.86 MIL/uL — AB (ref 3.87–5.11)
RDW: 15.2 % (ref 11.5–15.5)
WBC: 10.3 10*3/uL (ref 4.0–10.5)

## 2017-03-20 MED ORDER — PREDNISONE 10 MG PO TABS: ORAL_TABLET | ORAL | 0 refills | Status: DC

## 2017-03-20 MED ORDER — IPRATROPIUM-ALBUTEROL 0.5-2.5 (3) MG/3ML IN SOLN: 3.0000 mL | Freq: Two times a day (BID) | RESPIRATORY_TRACT | Status: DC

## 2017-03-20 MED ORDER — DOXYCYCLINE HYCLATE 100 MG PO TABS: 100.0000 mg | ORAL_TABLET | Freq: Two times a day (BID) | ORAL | 0 refills | Status: DC

## 2017-03-20 NOTE — Progress Notes (Signed)
Dc plan is to return to [facility] with home health services. Pt has used [facility] in the past and this CM confirmed with both pt and daughter [name] that they would like to use [facility] again. [facility] rep given referral. Will need home health orders for HHPT/OT/RN at discharge. Marney Doctor RN,BSN,NCM [phone]

## 2017-03-20 NOTE — Progress Notes (Signed)
Patient medically stable to return to ALF Daughter will be transporting patient back.  Discussed plan with [facility] who is in agreement to plan. FL2 updated. Sent over DC summary and FL2 [phone]  No other needs. DC to ALF.  Lane Hacker, MSW Clinical Social Work: [employer] Coverage for :  [phone]

## 2017-03-20 NOTE — Progress Notes (Signed)
Nutrition Brief Note  Consult received for assessment of nutrition requirements and status.   Wt Readings from Last 15 Encounters:  03/19/17 138 lb (62.6 kg)  01/10/17 135 lb (61.2 kg)  01/01/17 141 lb 8.6 oz (64.2 kg)  11/09/16 132 lb 12.8 oz (60.2 kg)  10/13/16 131 lb 13.4 oz (59.8 kg)  10/10/16 126 lb 12.2 oz (57.5 kg)  07/25/16 126 lb (57.2 kg)  07/18/16 129 lb 4.8 oz (58.7 kg)  02/04/16 122 lb 9.6 oz (55.6 kg)  01/07/16 121 lb 12.8 oz (55.2 kg)  11/23/15 120 lb 6.4 oz (54.6 kg)  11/12/15 115 lb 9.6 oz (52.4 kg)  11/04/15 121 lb (54.9 kg)  11/02/15 120 lb 3.2 oz (54.5 kg)  10/15/15 114 lb 9.6 oz (52 kg)    Body mass index is 22.27 kg/m. Patient meets criteria for normal weight based on current BMI. Skin WDL. Pt with hx of a.fib, complete heart block s/p pacemaker, CBG, COPD, polymyalgia rheumatica on chronic steroids, HTN. She was admitted for acute hypoxemic respiratory failure with the complaint of SOB.   Current diet order is Heart Healthy. Pt consumed 75% of breakfast this AM and 100% of breakfast and lunch yesterday. Labs and medications reviewed. Discharge order placed late this morning; discharge summary is not yet in place.   No nutrition interventions warranted at this time. If nutrition issues arise, please re-consult RD.     Jarome Matin, MS, RD, LDN, [facility] Inpatient Clinical Dietitian Pager # [phone] After hours/weekend pager # [phone]

## 2017-03-20 NOTE — Progress Notes (Signed)
OT Cancellation Note  Patient Details Name: [name] MRN: [id number] DOB: December 07, 1930   Cancelled Treatment:    Reason Eval/Treat Not Completed: OT screened, no needs identified, will sign off.  Pt with plans to discharge back to ALF today.  Will defer OT eval to [facility].   [place], OTR/L [phone]   Lucille Passy M 03/20/2017, 12:34 PM

## 2017-03-20 NOTE — NC FL2 (Signed)
[place] LEVEL OF CARE SCREENING TOOL     IDENTIFICATION  Patient Name: [name] Birthdate: 1931/06/11 Sex: female Admission Date (Current Location): 03/18/2017  Physician [facility] and [place] Number:  Herbalist and Address:  [facility],  [place], [place]      Provider Number: [id number]  Attending Physician Name and Address:  Dessa Phi Chahn-Yan*  Relative Name and Phone Number:       Current Level of Care: Hospital Recommended Level of Care: [place] Prior Approval Number:    Date Approved/Denied:   PASRR Number:    Discharge Plan: Other (Comment) (returning to ALF)    Current Diagnoses: Patient Active Problem List   Diagnosis Date Noted  . Stress fracture of thoracic vertebra 03/19/2017  . Dementia 03/19/2017  . Non-traumatic compression fracture of T9 thoracic vertebra ([place]) 01/02/2017  . Polymyalgia rheumatica ([place])   . Postural dizziness with near syncope   . Syncope 10/11/2016  . Postural dizziness with presyncope 10/11/2016  . Acute hypoxemic respiratory failure ([place]) 10/05/2016  . Chronic diastolic CHF (congestive heart failure) ([place]) 10/05/2016  . Smoker 10/05/2016  . Closed multiple fractures of right upper extremity with ribs with routine healing 10/05/2016  . HCAP (healthcare-associated pneumonia) 10/31/2015  . COPD (chronic obstructive pulmonary disease) ([place]) 10/31/2015  . Leukocytosis 10/31/2015  . Benign essential HTN 10/31/2015  . Anxiety state   . COPD exacerbation ([place]) 08/26/2015  . Systemic hypertension 08/26/2015  . Hypokalemia 08/26/2015  . Anticoagulant long-term use 08/26/2015  . DM (dermatomyositis) 08/26/2015  . Acute on chronic diastolic CHF (congestive heart failure), NYHA class 1 ([place])   . Sepsis ([place]) 06/07/2015  . CAP (community acquired pneumonia) 06/07/2015  . Acute CHF (congestive heart failure) ([place]) 06/07/2015  . Paroxysmal atrial fibrillation ([place])  09/04/2014  . Pacemaker 07/13/2013  . CHB (complete heart block) ([place]) 07/13/2013  . SSS (sick sinus syndrome) ([place]) 07/13/2013  . Orthostatic hypotension 07/13/2013  . Aortic insufficiency 07/13/2013    Orientation RESPIRATION BLADDER Height & Weight     Place  Normal Continent Weight: 138 lb (62.6 kg) Height:  5\' 6"  (167.6 cm)  BEHAVIORAL SYMPTOMS/MOOD NEUROLOGICAL BOWEL NUTRITION STATUS      Continent Diet (Resume diet )  AMBULATORY STATUS COMMUNICATION OF NEEDS Skin   Supervision Verbally Normal                       Personal Care Assistance Level of Assistance  Bathing, Feeding, Dressing Bathing Assistance: Limited assistance Feeding assistance: Independent Dressing Assistance: Limited assistance     Functional Limitations Info  Sight, Hearing, Speech Sight Info: Adequate Hearing Info: Adequate Speech Info: Adequate    SPECIAL CARE FACTORS FREQUENCY                       Contractures Contractures Info: Not present    Additional Factors Info  Code Status, Allergies Code Status Info: DNR Allergies Info: NKA           Current Medications (03/20/2017):  This is the current hospital active medication list Current Facility-Administered Medications  Medication Dose Route Frequency Provider Last Rate Last Dose  . acetaminophen (TYLENOL) tablet 650 mg  650 mg Oral Q6H PRN Karmen Bongo, MD       Or  . acetaminophen (TYLENOL) suppository 650 mg  650 mg Rectal Q6H PRN Karmen Bongo, MD      . albuterol (PROVENTIL) (2.5 MG/3ML)  0.083% nebulizer solution 2.5 mg  2.5 mg Nebulization Q2H PRN Karmen Bongo, MD      . ALPRAZolam Duanne Moron) tablet 0.25 mg  0.25 mg Oral Ivery Quale, MD   0.25 mg at 03/19/17 2122  . apixaban (ELIQUIS) tablet 2.5 mg  2.5 mg Oral BID Karmen Bongo, MD   2.5 mg at 03/20/17 0910  . docusate sodium (COLACE) capsule 100 mg  100 mg Oral BID Karmen Bongo, MD   100 mg at 03/20/17 0910  . doxycycline (VIBRA-TABS) tablet 100  mg  100 mg Oral Q12H Dessa Phi Chahn-Yang, DO   100 mg at 03/20/17 0910  . escitalopram (LEXAPRO) tablet 10 mg  10 mg Oral Daily Karmen Bongo, MD   10 mg at 03/20/17 0910  . hydrALAZINE (APRESOLINE) injection 5 mg  5 mg Intravenous Q4H PRN Karmen Bongo, MD      . HYDROcodone-acetaminophen (NORCO/VICODIN) 5-325 MG per tablet 1-2 tablet  1-2 tablet Oral Q4H PRN Karmen Bongo, MD   1 tablet at 03/19/17 2235  . ipratropium-albuterol (DUONEB) 0.5-2.5 (3) MG/3ML nebulizer solution 3 mL  3 mL Nebulization BID Dessa Phi Chahn-Yang, DO      . methylPREDNISolone sodium succinate (SOLU-MEDROL) 125 mg/2 mL injection 60 mg  60 mg Intravenous Lillia Mountain, MD   60 mg at 03/20/17 0700  . metoprolol succinate (TOPROL-XL) 24 hr tablet 25 mg  25 mg Oral Daily Karmen Bongo, MD   25 mg at 03/20/17 0910  . mirtazapine (REMERON) tablet 7.5 mg  7.5 mg Oral QHS Karmen Bongo, MD   7.5 mg at 03/19/17 2122  . mometasone-formoterol (DULERA) 200-5 MCG/ACT inhaler 2 puff  2 puff Inhalation BID Karmen Bongo, MD   2 puff at 03/20/17 0818  . ondansetron (ZOFRAN) tablet 4 mg  4 mg Oral Q6H PRN Karmen Bongo, MD       Or  . ondansetron [facility]) injection 4 mg  4 mg Intravenous Q6H PRN Karmen Bongo, MD         Discharge Medications: TAKE these medications   alendronate 70 MG tablet Commonly known as:  FOSAMAX Take 70 mg by mouth every Saturday. Take with a full glass of water on an empty stomach.   ALPRAZolam 0.25 MG tablet Commonly known as:  XANAX Take 0.25 mg by mouth at bedtime.   benzocaine 10 % mucosal gel Commonly known as:  ORAJEL Use as directed in the mouth or throat 3 (three) times daily as needed for mouth pain.   doxycycline 100 MG tablet Commonly known as:  VIBRA-TABS Take 1 tablet (100 mg total) by mouth every 12 (twelve) hours.   ELIQUIS 2.5 MG Tabs tablet Generic drug:  apixaban TAKE 1 TABLET(2.5 MG) BY MOUTH TWICE DAILY   escitalopram 10 MG  tablet Commonly known as:  LEXAPRO Take 10 mg by mouth daily.   furosemide 40 MG tablet Commonly known as:  LASIX Take 1 tablet (40 mg total) by mouth daily as needed (for weight gain of greater than 2lbs).   HYDROcodone-acetaminophen 5-325 MG tablet Commonly known as:  NORCO/VICODIN Take 1-2 tablets by mouth every 4 (four) hours as needed for moderate pain.   Ipratropium-Albuterol 20-100 MCG/ACT Aers respimat Commonly known as:  COMBIVENT RESPIMAT Inhale 1 puff into the lungs every 6 (six) hours as needed for wheezing or shortness of breath.   metoprolol succinate 25 MG 24 hr tablet Commonly known as:  TOPROL-XL Take 25 mg by mouth daily.   midodrine 2.5 MG tablet Commonly known  as:  PROAMATINE Take 2.5 mg by mouth as needed (when systolic BP is lower than 100).   mirtazapine 15 MG tablet Commonly known as:  REMERON Take 7.5 mg by mouth at bedtime.   potassium chloride SA 20 MEQ tablet Commonly known as:  K-DUR,KLOR-CON Take 20 mEq by mouth daily as needed (when taking Lasix).   predniSONE 10 MG tablet Commonly known as:  DELTASONE Take 10 mg by mouth daily. What changed:  Another medication with the same name was added. Make sure you understand how and when to take each.   predniSONE 10 MG tablet Commonly known as:  DELTASONE Take 4 tabs for 3 days, then 3 tabs for 3 days, then 2 tabs for 3 days, then 1 tab as your usual dose. What changed:  You were already taking a medication with the same name, and this prescription was added. Make sure you understand how and when to take each.   SYMBICORT 160-4.5 MCG/ACT inhaler Generic drug:  budesonide-formoterol Inhale 2 puffs into the lungs 2 (two) times daily.     Relevant Imaging Results:  Relevant Lab Results:   Additional Information SSN [id number]. Needs Home Health PT at facility: [place], Evie Lacks, [place]

## 2017-03-20 NOTE — Discharge Summary (Signed)
Physician Discharge Summary  [name] FTD:[id number] DOB: 1931-06-12 DOA: 03/18/2017  PCP: Lajean Manes, MD  Admit date: 03/18/2017 Discharge date: 03/20/2017  Admitted From: ALF Disposition:  ALF   Recommendations for Outpatient Follow-up:  1. Follow up with PCP in 1 week  Home Health: PT   Equipment/Devices: None   Discharge Condition: Stable CODE STATUS: DNR  Diet recommendation: Heart healthy   Brief/Interim Summary: Debbie Bruinsma Winslowis a 83 y.o.femalewith medical history significant of A Fib on Eliquis, complete heart block s/p pacemaker, CHF, COPD, polymyalgia rheumatica on chronic steroids, HTN presenting with SOB. She states that she started getting a little bit short of breath as well as thought her allergies are bothering her. Yesterday morning, she was noted to be wheezing, coughing and gasping for breath. Her pulse ox dropped down to 87% and heart rate up to 110. She presented to the emergency department. Chest x-ray was unremarkable, she had wheezes and crackles on examination. She was given IV Lasix, Solu-Medrol, nebs, and admitted for COPD exacerbation. During her hospitalization, she continued to improve symptomatically. On day of discharge, she was on room air, without crackles or wheeze. She was evaluated by physical therapy and was recommended for home health PT. Daughter was updated on patient's hospitalization. She will be discharged home with doxycycline, steroid taper.  Discharge Diagnoses:  Principal Problem:   Acute hypoxemic respiratory failure (HCC) Active Problems:   Paroxysmal atrial fibrillation (HCC)   COPD exacerbation (HCC)   Anticoagulant long-term use   Benign essential HTN   Stress fracture of thoracic vertebra   Dementia   COPD exacerbation -Improved today, now on room air. Discharge home with doxycycline, steroid taper  Afib on Eliquis -Rate controlled  -Has AV pacer -Continue Eliquis  HTN -Patient actually usually needs  Midodrine for hypotension and so this is unusual for her -BP stable  Stress fracture T8 -She does not report back pain at this time despite apparent new fracture at T8  Dementia -Appears to have developing early dementia -Suggest outpatient f/u for further evaluation  Polymyalgia rheumatica -Continue steroid taper than continue at chronic steroid dose    Discharge Instructions  Discharge Instructions    Call MD for:  difficulty breathing, headache or visual disturbances    Complete by:  As directed    Call MD for:  extreme fatigue    Complete by:  As directed    Call MD for:  hives    Complete by:  As directed    Call MD for:  persistant dizziness or light-headedness    Complete by:  As directed    Call MD for:  persistant nausea and vomiting    Complete by:  As directed    Call MD for:  severe uncontrolled pain    Complete by:  As directed    Call MD for:  temperature >100.4    Complete by:  As directed    Diet - low sodium heart healthy    Complete by:  As directed    Discharge instructions    Complete by:  As directed    You were cared for by a hospitalist during your hospital stay. If you have any questions about your discharge medications or the care you received while you were in the hospital after you are discharged, you can call the unit and asked to speak with the hospitalist on call if the hospitalist that took care of you is not available. Once you are discharged, your primary care physician  will handle any further medical issues. Please note that NO REFILLS for any discharge medications will be authorized once you are discharged, as it is imperative that you return to your primary care physician (or establish a relationship with a primary care physician if you do not have one) for your aftercare needs so that they can reassess your need for medications and monitor your lab values.   Increase activity slowly    Complete by:  As directed      Allergies as of  03/20/2017   No Known Allergies     Medication List    TAKE these medications   alendronate 70 MG tablet Commonly known as:  FOSAMAX Take 70 mg by mouth every Saturday. Take with a full glass of water on an empty stomach.   ALPRAZolam 0.25 MG tablet Commonly known as:  XANAX Take 0.25 mg by mouth at bedtime.   benzocaine 10 % mucosal gel Commonly known as:  ORAJEL Use as directed in the mouth or throat 3 (three) times daily as needed for mouth pain.   doxycycline 100 MG tablet Commonly known as:  VIBRA-TABS Take 1 tablet (100 mg total) by mouth every 12 (twelve) hours.   ELIQUIS 2.5 MG Tabs tablet Generic drug:  apixaban TAKE 1 TABLET(2.5 MG) BY MOUTH TWICE DAILY   escitalopram 10 MG tablet Commonly known as:  LEXAPRO Take 10 mg by mouth daily.   furosemide 40 MG tablet Commonly known as:  LASIX Take 1 tablet (40 mg total) by mouth daily as needed (for weight gain of greater than 2lbs).   HYDROcodone-acetaminophen 5-325 MG tablet Commonly known as:  NORCO/VICODIN Take 1-2 tablets by mouth every 4 (four) hours as needed for moderate pain.   Ipratropium-Albuterol 20-100 MCG/ACT Aers respimat Commonly known as:  COMBIVENT RESPIMAT Inhale 1 puff into the lungs every 6 (six) hours as needed for wheezing or shortness of breath.   metoprolol succinate 25 MG 24 hr tablet Commonly known as:  TOPROL-XL Take 25 mg by mouth daily.   midodrine 2.5 MG tablet Commonly known as:  PROAMATINE Take 2.5 mg by mouth as needed (when systolic BP is lower than 100).   mirtazapine 15 MG tablet Commonly known as:  REMERON Take 7.5 mg by mouth at bedtime.   potassium chloride SA 20 MEQ tablet Commonly known as:  K-DUR,KLOR-CON Take 20 mEq by mouth daily as needed (when taking Lasix).   predniSONE 10 MG tablet Commonly known as:  DELTASONE Take 10 mg by mouth daily. What changed:  Another medication with the same name was added. Make sure you understand how and when to take each.    predniSONE 10 MG tablet Commonly known as:  DELTASONE Take 4 tabs for 3 days, then 3 tabs for 3 days, then 2 tabs for 3 days, then 1 tab as your usual dose. What changed:  You were already taking a medication with the same name, and this prescription was added. Make sure you understand how and when to take each.   SYMBICORT 160-4.5 MCG/ACT inhaler Generic drug:  budesonide-formoterol Inhale 2 puffs into the lungs 2 (two) times daily.      Follow-up Information    Stone[place], Hal, MD. Schedule an appointment as soon as possible for a visit in 1 week(s).   Specialty:  Internal Medicine Contact information: [address] [place] [address] [phone]          No Known Allergies  Consultations:  None   Procedures/Studies:  Dg Chest 2 View  Result Date: 03/18/2017 CLINICAL DATA:  Shortness of breath EXAM: CHEST  2 VIEW COMPARISON:  01/10/2017 FINDINGS: Right chest wall pacer device is noted with lead in the right atrial appendage and right ventricle. Mild cardiac enlargement. Small right pleural effusion is identified. Diffuse osteopenia. Compression deformities within the midthoracic spine are identified. When compared with the exam from 01/10/2017 the fracture at T8 is new. IMPRESSION: 1. No acute cardiopulmonary abnormalities. 2. New T8 compression fracture. Electronically Signed   By: Kerby Moors M.D.   On: 03/18/2017 20:18       Discharge Exam: Vitals:   03/20/17 0910 03/20/17 1500  BP: (!) 141/62 (!) 154/67  Pulse: 72 70  Resp:  16  Temp:  97.8 F (36.6 C)  SpO2:  95%   Vitals:   03/20/17 0557 03/20/17 0819 03/20/17 0910 03/20/17 1500  BP: (!) 159/76  (!) 141/62 (!) 154/67  Pulse: 71  72 70  Resp: 18   16  Temp: 97.9 F (36.6 C)   97.8 F (36.6 C)  TempSrc: Oral   Oral  SpO2: 94% 91%  95%  Weight:      Height:        General: Pt is alert, awake, not in acute distress Cardiovascular: RRR, S1/S2 +, no rubs, no gallops Respiratory:  CTA bilaterally, no wheezing, no rhonchi Abdominal: Soft, NT, ND, bowel sounds + Extremities: no edema, no cyanosis    The results of significant diagnostics from this hospitalization (including imaging, microbiology, ancillary and laboratory) are listed below for reference.     Microbiology: No results found for this or any previous visit (from the past 240 hour(s)).   Labs: BNP (last 3 results)  Recent Labs  10/05/16 0930 10/08/16 1741 03/18/17 2142  BNP 544.3* 675.1* 725.3*   Basic Metabolic Panel:  Recent Labs Lab 03/18/17 2142 03/19/17 0511 03/20/17 0539  NA 142 139 143  K 4.1 3.6 3.7  CL 108 101 106  CO2 24 26 31   GLUCOSE 151* 234* 95  BUN 13 16 16   CREATININE 0.72 0.74 0.79  CALCIUM 8.6* 8.6* 8.8*   Liver Function Tests:  Recent Labs Lab 03/18/17 2142  AST 28  ALT 17  ALKPHOS 85  BILITOT 0.8  PROT 6.5  ALBUMIN 3.3*   No results for input(s): LIPASE, AMYLASE in the last 168 hours. No results for input(s): AMMONIA in the last 168 hours. CBC:  Recent Labs Lab 03/18/17 2142 03/19/17 0511 03/20/17 0539  WBC 9.8 8.0 10.3  NEUTROABS 7.7  --  7.7  HGB 11.2* 11.9* 10.7*  HCT 35.5* 37.9 34.8*  MCV 91.7 91.3 90.2  PLT 289 284 300   Cardiac Enzymes: No results for input(s): CKTOTAL, CKMB, CKMBINDEX, TROPONINI in the last 168 hours. BNP: Invalid input(s): POCBNP CBG: No results for input(s): GLUCAP in the last 168 hours. D-Dimer No results for input(s): DDIMER in the last 72 hours. Hgb A1c No results for input(s): HGBA1C in the last 72 hours. Lipid Profile No results for input(s): CHOL, HDL, LDLCALC, TRIG, CHOLHDL, LDLDIRECT in the last 72 hours. Thyroid function studies No results for input(s): TSH, T4TOTAL, T3FREE, THYROIDAB in the last 72 hours.  Invalid input(s): FREET3 Anemia work up No results for input(s): VITAMINB12, FOLATE, FERRITIN, TIBC, IRON, RETICCTPCT in the last 72 hours. Urinalysis    Component Value Date/Time    COLORURINE YELLOW 10/11/2016 0033   APPEARANCEUR CLEAR 10/11/2016 0033   LABSPEC 1.016 10/11/2016 0033   PHURINE 6.0  10/11/2016 0033   GLUCOSEU NEGATIVE 10/11/2016 0033   HGBUR SMALL (A) 10/11/2016 0033   BILIRUBINUR NEGATIVE 10/11/2016 0033   KETONESUR NEGATIVE 10/11/2016 0033   PROTEINUR NEGATIVE 10/11/2016 0033   UROBILINOGEN 1.0 01/19/2011 1030   NITRITE NEGATIVE 10/11/2016 0033   LEUKOCYTESUR NEGATIVE 10/11/2016 0033   Sepsis Labs Invalid input(s): PROCALCITONIN,  WBC,  LACTICIDVEN Microbiology No results found for this or any previous visit (from the past 240 hour(s)).   Time coordinating discharge: 40 minutes  SIGNED:  Dessa Phi, DO [facility] Pager [phone]  If 7PM-7AM, please contact night-coverage [email or web address] Password [email or web address] 03/20/2017, 3:16 PM

## 2017-03-20 NOTE — Progress Notes (Signed)
Physical Therapy Treatment Patient Details Name: [name] MRN: [id number] DOB: 29-May-1931 Today's Date: 03/20/2017    History of Present Illness Pt admitted through ED 2* SOB 2* COPD exac and acute on chronic CHF.  Pt Xray also showing new T-8 compression fx.  Pt with hx of T-9 comp fx s/p kyphoplasty, DM, pacemaker, CHF, CHB, and SSS.    PT Comments    Pleasant lady with mild short term memory loss.  She was unable to recall the name of her ALF and initially thought she was presently there.  Assisted OOB to amb to bathroom then in hallway.  Progressing well with her mobility.     Follow Up Recommendations  Home health PT (at her ALF)     Equipment Recommendations  None recommended by PT    Recommendations for Other Services       Precautions / Restrictions Precautions Precautions: Fall Precaution Comments: mild memory loss Restrictions Weight Bearing Restrictions: No    Mobility  Bed Mobility Overal bed mobility: Modified Independent             General bed mobility comments: increased time  Transfers Overall transfer level: Needs assistance Equipment used: None Transfers: Sit to/from Stand;Stand Pivot Transfers Sit to Stand: Supervision;Min guard Stand pivot transfers: Supervision;Min guard       General transfer comment: min guard to steady with initial standing and safety  Ambulation/Gait Ambulation/Gait assistance: Supervision;Min guard Ambulation Distance (Feet): 225 Feet Assistive device: None Gait Pattern/deviations: Step-through pattern Gait velocity: WFL   General Gait Details: amb without any AD and with out hand held assist.  Therapist only used gait belt.  Goor alternating gait with functional speed.  Avg RA 89% with 2/4 dyspnea and mild upper resiratory congestion.  Encouraged pt to cough and clear throat.     Stairs            Wheelchair Mobility    Modified Rankin (Stroke Patients Only)       Balance                                             Cognition Arousal/Alertness: Awake/alert Behavior During Therapy: WFL for tasks assessed/performed;Impulsive Overall Cognitive Status: Within Functional Limits for tasks assessed                                 General Comments: pleasant with mild memory loss      Exercises      General Comments        Pertinent Vitals/Pain Pain Assessment: No/denies pain    Home Living                      Prior Function            PT Goals (current goals can now be found in the care plan section) Progress towards PT goals: Progressing toward goals    Frequency    Min 3X/week      PT Plan Current plan remains appropriate    Co-evaluation              AM-PAC PT "6 Clicks" Daily Activity  Outcome Measure  Difficulty turning over in bed (including adjusting bedclothes, sheets and blankets)?: A Little Difficulty moving from lying on back to sitting on the side  of the bed? : A Little Difficulty sitting down on and standing up from a chair with arms (e.g., wheelchair, bedside commode, etc,.)?: A Little Help needed moving to and from a bed to chair (including a wheelchair)?: A Little Help needed walking in hospital room?: A Little Help needed climbing 3-5 steps with a railing? : A Little 6 Click Score: 18    End of Session Equipment Utilized During Treatment: Gait belt Activity Tolerance: Patient tolerated treatment well;Patient limited by fatigue Patient left: in bed;with call bell/phone within reach Nurse Communication: Mobility status PT Visit Diagnosis: Unsteadiness on feet (R26.81);Difficulty in walking, not elsewhere classified (R26.2);Muscle weakness (generalized) (M62.81)     Time: 9562-1308 PT Time Calculation (min) (ACUTE ONLY): 11 min  Charges:  $Gait Training: 8-22 mins                    G Codes:       Rica Koyanagi  PTA [facility]  Acute  Rehab Pager      [phone]

## 2017-03-20 NOTE — Clinical Social Work Note (Signed)
Clinical Social Work Assessment  Patient Details  Name: [name] MRN: [id number] Date of Birth: Jun 08, 1931  Date of referral:  03/20/17               Reason for consult:  Discharge Planning, [employer]                Permission sought to share information with:  Case Freight forwarder, Customer service manager, Family Supports Permission granted to share information::  Yes, Verbal Permission Granted  Name::        Agency::  [facility]  Relationship::  Kaylyn Layer, Daughter  Contact Information:     Housing/Transportation Living arrangements for the past 2 months:  [place] of Information:  Patient, Medical Team, Tourist information centre manager, Facility, Adult Children Patient Interpreter Needed:  None Criminal Activity/Legal Involvement Pertinent to Current Situation/Hospitalization:  No - Comment as needed Significant Relationships:  Adult Children, Warehouse manager, Other Family Members Lives with:  Facility Resident Do you feel safe going back to the place where you live?  Yes Need for family participation in patient care:  Yes (Comment)  Care giving concerns:  Patient admitted from her ALF: [facility] for Acute on chronic respiratory failure associated with a COPD exacerbation -Patient's shortness of breath and productive cough are most likely caused by acute COPD exacerbation  Patient has no other concerns noted other that her breathing/COPD.  She was evaluated by PT again (seen in July of this year) and recommendations remain for Home health PT at discharge. Patient is a long term resident of [facility] and will return to facility.  Daughter confirms plans with no barriers at this time. [facility] called and updated regarding patient's treatment plans and admission. Will update FL2 once closer to discharge to reflect any medication changes.   Social Worker assessment / plan:  Assessment and consult completed.  Spoke with daughter via phone who  confirms all plans above. Daughter reports patient is under the impression she may leave today in which daughter reports she will come and pick patient up. Patient will resume home health and daughter feels this is appropriate. Reports patient does not use O2 at facility (when she discharged last time she had it for a short time, but currently does not use. Patient on RA at this time.    LCSW will be available and assist with disposition back to ALF.  Employment status:  Retired Health visitor PT Recommendations:  Home with [place] / Referral to community resources:     Patient/Family's Response to care:  Daughter understanding and agreeable to plan  Patient/Family's Understanding of and Emotional Response to Diagnosis, Current Treatment, and Prognosis:  Daughter reports patient has seasonal allergies and the two were on a small vacation in which daughter feels climate change has exacerbated her symptoms.  Reports she brought her right in as she was concerned for her breathing. She is aware of current negative Chest xray and pleased with overall prognosis of patient and improvement.  Emotional Assessment Appearance:  Appears stated age Attitude/Demeanor/Rapport:    Affect (typically observed):  Accepting, Adaptable Orientation:  Oriented to Self, Oriented to Place Alcohol / Substance use:  Not Applicable Psych involvement (Current and /or in the community):  No (Comment)  Discharge Needs  Concerns to be addressed:  No discharge needs identified Readmission within the last 30 days:  No Current discharge risk:  None Barriers to Discharge:  No Barriers Identified, Continued Medical Work up   [employer], [employer]  N, LCSW 03/20/2017, 9:06 AM

## 2017-03-22 DIAGNOSIS — Z4789 Encounter for other orthopedic aftercare: Secondary | ICD-10-CM | POA: Diagnosis not present

## 2017-03-22 DIAGNOSIS — R262 Difficulty in walking, not elsewhere classified: Secondary | ICD-10-CM | POA: Diagnosis not present

## 2017-03-22 DIAGNOSIS — F419 Anxiety disorder, unspecified: Secondary | ICD-10-CM | POA: Diagnosis not present

## 2017-03-22 DIAGNOSIS — M6281 Muscle weakness (generalized): Secondary | ICD-10-CM | POA: Diagnosis not present

## 2017-03-22 DIAGNOSIS — J441 Chronic obstructive pulmonary disease with (acute) exacerbation: Secondary | ICD-10-CM | POA: Diagnosis not present

## 2017-03-22 DIAGNOSIS — S22070D Wedge compression fracture of T9-T10 vertebra, subsequent encounter for fracture with routine healing: Secondary | ICD-10-CM | POA: Diagnosis not present

## 2017-03-22 DIAGNOSIS — M353 Polymyalgia rheumatica: Secondary | ICD-10-CM | POA: Diagnosis not present

## 2017-03-22 DIAGNOSIS — I5032 Chronic diastolic (congestive) heart failure: Secondary | ICD-10-CM | POA: Diagnosis not present

## 2017-03-23 DIAGNOSIS — Z4789 Encounter for other orthopedic aftercare: Secondary | ICD-10-CM | POA: Diagnosis not present

## 2017-03-23 DIAGNOSIS — I5032 Chronic diastolic (congestive) heart failure: Secondary | ICD-10-CM | POA: Diagnosis not present

## 2017-03-23 DIAGNOSIS — R262 Difficulty in walking, not elsewhere classified: Secondary | ICD-10-CM | POA: Diagnosis not present

## 2017-03-23 DIAGNOSIS — S22070D Wedge compression fracture of T9-T10 vertebra, subsequent encounter for fracture with routine healing: Secondary | ICD-10-CM | POA: Diagnosis not present

## 2017-03-23 DIAGNOSIS — M6281 Muscle weakness (generalized): Secondary | ICD-10-CM | POA: Diagnosis not present

## 2017-03-23 DIAGNOSIS — M353 Polymyalgia rheumatica: Secondary | ICD-10-CM | POA: Diagnosis not present

## 2017-03-24 ENCOUNTER — Ambulatory Visit (INDEPENDENT_AMBULATORY_CARE_PROVIDER_SITE_OTHER): Payer: Medicare Other | Admitting: Pulmonary Disease

## 2017-03-24 ENCOUNTER — Encounter: Payer: Self-pay | Admitting: Pulmonary Disease

## 2017-03-24 VITALS — BP 110/62 | HR 75 | Ht 66.0 in | Wt 136.0 lb

## 2017-03-24 DIAGNOSIS — J438 Other emphysema: Secondary | ICD-10-CM

## 2017-03-24 LAB — PULMONARY FUNCTION TEST
DL/VA % pred: 68 %
DL/VA: 3.1 ml/min/mmHg/L
DLCO UNC: 9.69 ml/min/mmHg
DLCO unc % pred: 44 %
FEF 25-75 Pre: 0.47 L/sec
FEF2575-%Pred-Pre: 42 %
FEV1-%Pred-Pre: 54 %
FEV1-Pre: 0.88 L
FEV1FVC-%PRED-PRE: 88 %
FEV6-%Pred-Pre: 65 %
FEV6-PRE: 1.34 L
FEV6FVC-%Pred-Pre: 104 %
FVC-%PRED-PRE: 62 %
FVC-PRE: 1.37 L
PRE FEV1/FVC RATIO: 64 %
PRE FEV6/FVC RATIO: 98 %
RV % pred: 127 %
RV: 3.03 L
TLC % PRED: 92 %
TLC: 4.37 L

## 2017-03-24 MED ORDER — FLUTICASONE-UMECLIDIN-VILANT 100-62.5-25 MCG/INH IN AEPB: 1.0000 | INHALATION_SPRAY | Freq: Every day | RESPIRATORY_TRACT | 0 refills | Status: DC

## 2017-03-24 NOTE — Progress Notes (Signed)
[name]    [id number]    [birth year]  Primary Care Physician:Stoneking, Christiane Ha, MD  Referring Physician: Lajean Manes, MD [address] [place], [place] [address]  Chief complaint:  Follow up for  COPD  HPI: 83 year old with history of complete heart block status post pacemaker, diastolic heart failure, paroxysmal A. fib, and prolymyalgia rheumatica, COPD. She was admitted to [facility] in April, 2018 while on a trip outside [place]. She did not seek medical attention for about a week and came with worsening dyspnea, cough, congestion, mucus production, wheezing. CT scan in the ED showed fractured ribs on the right with extensive consolidation, narrowing of the bronchus on the right with question on endobronchial lesion. PCCM called for further evaluation. She underwent a bronchoscopy on 10/07/16 which did not show any endobronchial lesion. There is purulent secretions in the right lung which was sent for culture. The microbiology shows Pseudomonas and viridans strep which was treated adequately with antibiotics. The AFB culture is showing MAI. She was sent to rehabilitation and is now in assisted living and is making slow recovery.  Interim History: She's had multiple admissions this year after last clinic visit for acute on chronic respiratory failure, CHF, COPD exacerbation. She was discharged home on 03/20/17. Also noted to have a stress fracture in T8. She is currently on prednisone taper  Outpatient Encounter Prescriptions as of 03/24/2017  Medication Sig  . alendronate (FOSAMAX) 70 MG tablet Take 70 mg by mouth every Saturday. Take with a full glass of water on an empty stomach.  . ALPRAZolam (XANAX) 0.25 MG tablet Take 0.25 mg by mouth at bedtime.   . benzocaine (ORAJEL) 10 % mucosal gel Use as directed in the mouth or throat 3 (three) times daily as needed for mouth pain.  [name] ELIQUIS 2.5 MG TABS tablet TAKE 1 TABLET(2.5 MG) BY MOUTH TWICE DAILY  . escitalopram  (LEXAPRO) 10 MG tablet Take 10 mg by mouth daily.  . furosemide (LASIX) 40 MG tablet Take 1 tablet (40 mg total) by mouth daily as needed (for weight gain of greater than 2lbs).  [name] HYDROcodone-acetaminophen (NORCO/VICODIN) 5-325 MG tablet Take 1-2 tablets by mouth every 4 (four) hours as needed for moderate pain.  . Ipratropium-Albuterol (COMBIVENT RESPIMAT) 20-100 MCG/ACT AERS respimat Inhale 1 puff into the lungs every 6 (six) hours as needed for wheezing or shortness of breath.  . metoprolol succinate (TOPROL-XL) 25 MG 24 hr tablet Take 25 mg by mouth daily.   . midodrine (PROAMATINE) 2.5 MG tablet Take 2.5 mg by mouth as needed (when systolic BP is lower than 100).   . mirtazapine (REMERON) 15 MG tablet Take 7.5 mg by mouth at bedtime.   . potassium chloride SA (K-DUR,KLOR-CON) 20 MEQ tablet Take 20 mEq by mouth daily as needed (when taking Lasix).   . predniSONE (DELTASONE) 10 MG tablet Take 10 mg by mouth daily.   . predniSONE (DELTASONE) 10 MG tablet Take 4 tabs for 3 days, then 3 tabs for 3 days, then 2 tabs for 3 days, then 1 tab as your usual dose.  . SYMBICORT 160-4.5 MCG/ACT inhaler Inhale 2 puffs into the lungs 2 (two) times daily.  . [DISCONTINUED] doxycycline (VIBRA-TABS) 100 MG tablet Take 1 tablet (100 mg total) by mouth every 12 (twelve) hours.   No facility-administered encounter medications on file as of 03/24/2017.     Allergies as of 03/24/2017  . (No Known Allergies)  Past Medical History:  Diagnosis Date  . CHB (complete heart block) ([place]) 07/13/2013   Pacemaker dependent  . CHF (congestive heart failure) ([place])   . COPD (chronic obstructive pulmonary disease) ([place])   . DM (dermatomyositis)   . Orthostatic hypotension   . Pacemaker 07/13/2013   Her original pacemaker and the current leads were implanted in 1992. She has had 2 generator change out, most recently in 2010. Her device is a Buyer, retail 2110 non RF dual-chamber pacemaker with a battery longevity  estimated at about 7 years. The atrial lead is a St. Jude [id number] and the ventricular lead was a Biotronik [id number].   [name] Paroxysmal atrial fibrillation ([place]) 09/04/2014   on Eliquis  . Polymyalgia rheumatica ([place])   . Scoliosis   . SSS (sick sinus syndrome) ([place]) 07/13/2013  . Syncope   . Systemic hypertension   . Thoracic kyphosis     Past Surgical History:  Procedure Laterality Date  . IR KYPHO THORACIC WITH BONE BIOPSY  01/04/2017  . NM MYOCAR PERF WALL MOTION  09/13/2011   Low risk  . PERMANENT PACEMAKER GENERATOR CHANGE  03/27/2009   [employer]ude  . US ECHOCARDIOGRAPHY  03/28/2012   Mod LAE,mild MR,aortic sclerosis w/mod AI,mod. TR,mild PI,Stage I diastolic dysfunction  . VIDEO BRONCHOSCOPY Bilateral 10/07/2016   Procedure: VIDEO BRONCHOSCOPY WITH FLUORO;  Surgeon: Marshell Garfinkel, MD;  Location: [facility];  Service: Cardiopulmonary;  Laterality: Bilateral;    Family History  Problem Relation Age of Onset  . Stroke Mother     Social History   Social History  . Marital status: Married    Spouse name: N/A  . Number of children: N/A  . Years of education: N/A   Occupational History  . retired    Social History Main Topics  . Smoking status: Former Smoker    Packs/day: 0.50    Years: 60.00    Types: Cigarettes  . Smokeless tobacco: Never Used     Comment: quit 12/2016  . Alcohol use No  . Drug use: No  . Sexual activity: Not on file   Other Topics Concern  . Not on file   Social History Narrative  . No narrative on file   Review of systems: Review of Systems  Constitutional: Negative for fever and chills.  HENT: Negative.   Eyes: Negative for blurred vision.  Respiratory: as per HPI  Cardiovascular: Negative for chest pain and palpitations.  Gastrointestinal: Negative for vomiting, diarrhea, blood per rectum. Genitourinary: Negative for dysuria, urgency, frequency and hematuria.  Musculoskeletal: Negative for myalgias, back pain and joint pain.  Skin: Negative for  itching and rash.  Neurological: Negative for dizziness, tremors, focal weakness, seizures and loss of consciousness.  Endo/Heme/Allergies: Negative for environmental allergies.  Psychiatric/Behavioral: Negative for depression, suicidal ideas and hallucinations.  All other systems reviewed and are negative.  Physical Exam: Blood pressure 110/62, pulse 75, height 5\' 6"  (1.676 m), weight 61.7 kg (136 lb), SpO2 97 %. Gen:      No acute distress HEENT:  EOMI, sclera anicteric Neck:     No masses; no thyromegaly Lungs:    Clear to auscultation bilaterally; normal respiratory effort CV:         Regular rate and rhythm; no murmurs Abd:      + bowel sounds; soft, non-tender; no palpable masses, no distension Ext:    No edema; adequate peripheral perfusion Skin:      Warm and dry; no rash Neuro: alert and oriented x  3 Psych: normal mood and affect  Data Reviewed: Bronchoscopy culture 10/07/16-  Pseudomonas aeruginosa Strep viridans MAI  Echocardiogram 10/12/14 Apical hypokinesis with mildly reduced LV systolic function; mild  LVH; sclerotic aortic valve with mild AI; mild MR; mild biatrial  enlargement; mild RVE; mild TR; mildly elevated pulmonary  pressure.  CT chest 10/05/16 - trace right effusion, multiple rib fractures on R, extensive consolidative changes and RML / LL and LL with narrowing of right mainstem bronchus. Subcarinal lymph node.  CT angiogram 01/02/17-no pulmonary medicine, improved infiltrates in the right lower lobe and right middle lobe. Scattered atelectasis, bronchial wall thickening, small effusions I had reviewed the images personally.  PFTs 03/24/17 FVC 2.19 (62%], FEV1 1.61 [54%], F/F 73, TLC 92%, RV/TLC 141%, DLCO 44% Moderate severe obstruction with argatroban, severe diffusion defect.  Assessment:  COPD PFTs done today were reviewed. She has moderate-severe obstruction consistent with COPD and there is also has significant air trapping. We'll switch to  Symbicort at [facility] for better bronchodilator action.  Currently on pred taper which she will reduce till she reached her baseline dose of 10mg - which she is on for PMR. She was ambulated in recent hospitalization with no desats. We will continue to monitor. She quit smoking earlier this year.  Right lung pneumonia after fall Developed rt pneumonia after fall in April 2018. There is a question of endobronchial lesion but a bronchoscopy was negative I have reviewed her repeat CT scan which shows improvement in the right opacities.  She has MAI growing in the sputum which is likely nonsignificant.  No need to treat  Code status- DNR. Confirmed with her patient  Plan/Recommendations: - Stop symbicort, start trelegy  Marshell Garfinkel MD [facility] Pager [phone] 03/24/2017, 12:26 PM  CC: Lajean Manes, MD                  [name]    [id number]    [birth year]  Primary Care Physician:Stoneking, Christiane Ha, MD  Referring Physician: Lajean Manes, MD [address] [place], [place] [address]  Chief complaint:  Follow up for COPD and recent hospitalization for PNA.  HPI: 83 year old with history of complete heart block status post pacemaker, diastolic heart failure, paroxysmal A. fib, and prolymyalgia rheumatica, COPD. She was admitted to [facility] in April, 2018 while on a trip outside [place]. She did not seek medical attention for about a week and came with worsening dyspnea, cough, congestion, mucus production, wheezing. CT scan in the ED showed fractured ribs on the right with extensive consolidation, narrowing of the bronchus on the right with question on endobronchial lesion. PCCM called for further evaluation. She underwent a bronchoscopy on 10/07/16 which did not show any endobronchial lesion. There is purulent secretions in the right lung which was sent for culture. The microbiology shows Pseudomonas and viridans strep which was treated  adequately with antibiotics. The AFB culture is showing MAI. She was sent to rehabilitation and is now in assisted living and is making slow recovery.  She has history of COPD and is maintained on inhalers by primary care. She has not had PFTs or a pulmonary eval,  not on home O2. She still continues to smoke   Outpatient Encounter Prescriptions as of 03/24/2017  Medication Sig  . alendronate (FOSAMAX) 70 MG tablet Take 70 mg by mouth every Saturday. Take with a full glass of water on an empty stomach.  . ALPRAZolam (XANAX) 0.25 MG tablet Take 0.25 mg  by mouth at bedtime.   . benzocaine (ORAJEL) 10 % mucosal gel Use as directed in the mouth or throat 3 (three) times daily as needed for mouth pain.  [name] ELIQUIS 2.5 MG TABS tablet TAKE 1 TABLET(2.5 MG) BY MOUTH TWICE DAILY  . escitalopram (LEXAPRO) 10 MG tablet Take 10 mg by mouth daily.  . furosemide (LASIX) 40 MG tablet Take 1 tablet (40 mg total) by mouth daily as needed (for weight gain of greater than 2lbs).  [name] HYDROcodone-acetaminophen (NORCO/VICODIN) 5-325 MG tablet Take 1-2 tablets by mouth every 4 (four) hours as needed for moderate pain.  . Ipratropium-Albuterol (COMBIVENT RESPIMAT) 20-100 MCG/ACT AERS respimat Inhale 1 puff into the lungs every 6 (six) hours as needed for wheezing or shortness of breath.  . metoprolol succinate (TOPROL-XL) 25 MG 24 hr tablet Take 25 mg by mouth daily.   . midodrine (PROAMATINE) 2.5 MG tablet Take 2.5 mg by mouth as needed (when systolic BP is lower than 100).   . mirtazapine (REMERON) 15 MG tablet Take 7.5 mg by mouth at bedtime.   . potassium chloride SA (K-DUR,KLOR-CON) 20 MEQ tablet Take 20 mEq by mouth daily as needed (when taking Lasix).   . predniSONE (DELTASONE) 10 MG tablet Take 10 mg by mouth daily.   . predniSONE (DELTASONE) 10 MG tablet Take 4 tabs for 3 days, then 3 tabs for 3 days, then 2 tabs for 3 days, then 1 tab as your usual dose.  . SYMBICORT 160-4.5 MCG/ACT inhaler Inhale 2 puffs into  the lungs 2 (two) times daily.  . [DISCONTINUED] doxycycline (VIBRA-TABS) 100 MG tablet Take 1 tablet (100 mg total) by mouth every 12 (twelve) hours.   No facility-administered encounter medications on file as of 03/24/2017.     Allergies as of 03/24/2017  . (No Known Allergies)    Past Medical History:  Diagnosis Date  . CHB (complete heart block) ([place]) 07/13/2013   Pacemaker dependent  . CHF (congestive heart failure) ([place])   . COPD (chronic obstructive pulmonary disease) ([place])   . DM (dermatomyositis)   . Orthostatic hypotension   . Pacemaker 07/13/2013   Her original pacemaker and the current leads were implanted in 1992. She has had 2 generator change out, most recently in 2010. Her device is a Buyer, retail 2110 non RF dual-chamber pacemaker with a battery longevity estimated at about 7 years. The atrial lead is a St. Jude [id number] and the ventricular lead was a Biotronik [id number].   [name] Paroxysmal atrial fibrillation ([place]) 09/04/2014   on Eliquis  . Polymyalgia rheumatica ([place])   . Scoliosis   . SSS (sick sinus syndrome) ([place]) 07/13/2013  . Syncope   . Systemic hypertension   . Thoracic kyphosis     Past Surgical History:  Procedure Laterality Date  . IR KYPHO THORACIC WITH BONE BIOPSY  01/04/2017  . NM MYOCAR PERF WALL MOTION  09/13/2011   Low risk  . PERMANENT PACEMAKER GENERATOR CHANGE  03/27/2009   [employer]  . US ECHOCARDIOGRAPHY  03/28/2012   Mod LAE,mild MR,aortic sclerosis w/mod AI,mod. TR,mild PI,Stage I diastolic dysfunction  . VIDEO BRONCHOSCOPY Bilateral 10/07/2016   Procedure: VIDEO BRONCHOSCOPY WITH FLUORO;  Surgeon: Marshell Garfinkel, MD;  Location: [facility];  Service: Cardiopulmonary;  Laterality: Bilateral;    Family History  Problem Relation Age of Onset  . Stroke Mother     Social History   Social History  . Marital status: Married    Spouse name: N/A  . Number  of children: N/A  . Years of education: N/A   Occupational History  . retired    Social  History Main Topics  . Smoking status: Former Smoker    Packs/day: 0.50    Years: 60.00    Types: Cigarettes  . Smokeless tobacco: Never Used     Comment: quit 12/2016  . Alcohol use No  . Drug use: No  . Sexual activity: Not on file   Other Topics Concern  . Not on file   Social History Narrative  . No narrative on file    Review of systems: Review of Systems  Constitutional: Negative for fever and chills.  HENT: Negative.   Eyes: Negative for blurred vision.  Respiratory: as per HPI  Cardiovascular: Negative for chest pain and palpitations.  Gastrointestinal: Negative for vomiting, diarrhea, blood per rectum. Genitourinary: Negative for dysuria, urgency, frequency and hematuria.  Musculoskeletal: Negative for myalgias, back pain and joint pain.  Skin: Negative for itching and rash.  Neurological: Negative for dizziness, tremors, focal weakness, seizures and loss of consciousness.  Endo/Heme/Allergies: Negative for environmental allergies.  Psychiatric/Behavioral: Negative for depression, suicidal ideas and hallucinations.  All other systems reviewed and are negative.  Physical Exam: Blood pressure 106/64, pulse 76, height 5\' 5"  (1.651 m), weight 132 lb 12.8 oz (60.2 kg), SpO2 94 %. Gen:      No acute distress HEENT:  EOMI, sclera anicteric Neck:     No masses; no thyromegaly Lungs:    Clear to auscultation bilaterally; normal respiratory effort CV:         Regular rate and rhythm; no murmurs Abd:      + bowel sounds; soft, non-tender; no palpable masses, no distension Ext:    No edema; adequate peripheral perfusion Skin:      Warm and dry; no rash Neuro: alert and oriented x 3 Psych: normal mood and affect  Data Reviewed: Bronchoscopy culture 10/07/16-  Pseudomonas aeruginosa Strep viridans MAI  Echocardiogram 10/12/14 Apical hypokinesis with mildly reduced LV systolic function; mild  LVH; sclerotic aortic valve with mild AI; mild MR; mild biatrial  enlargement;  mild RVE; mild TR; mildly elevated pulmonary  pressure.  CT chest 10/05/16 - trace right effusion, multiple rib fractures on R, extensive consolidative changes and RML / LL and LL with narrowing of right mainstem bronchus. Subcarinal lymph node. I had reviewed the images personally  Assessment:  Right lung pneumonia after fall ? Endobronchial lesion I suspect suspect she developed a PNA after a fall due to splinting in the setting of multiple rib fractures with inability to clear secretions. There was a questionable endobronchial lesion but it was probably just mucus. We will repeat a CT scan in a couple of months to ensure clearance of the infiltrate. She has MAI growing in the sputum but we can hold off on treating this now as clinically she is improved. Reval after CT scan.  COPD She likely has COPD given her extensive smoking history. I'll start on Symbicort and order PFTs. I am not sure why she is on 10 mg of prednisone. If she is stable at next visit we'll start tapering this off.  Active smoker I have strongly encouraged her to quit smoking altogether. She wants to do this on her own. Time spent counseling-5 minutes  Plan/Recommendations: - Follow up CT scan  - PFTs - Start symbicort - Smoking cessation.  Marshell Garfinkel MD [facility] Pager [phone] 03/24/2017, 12:49 PM  CC: [place],  Christiane Ha, MD

## 2017-03-24 NOTE — Patient Instructions (Signed)
We will stop the Symbicort and start trelegy inhaler I had reviewed her pulmonary function tests with you today. They show moderate-severe COPD We will continue to monitor you and recheck oxygen levels at next visit  Follow-up in 3 months.

## 2017-03-24 NOTE — Progress Notes (Signed)
PFT done today. 

## 2017-03-25 DIAGNOSIS — J9601 Acute respiratory failure with hypoxia: Secondary | ICD-10-CM | POA: Diagnosis not present

## 2017-03-25 DIAGNOSIS — Z7901 Long term (current) use of anticoagulants: Secondary | ICD-10-CM | POA: Diagnosis not present

## 2017-03-25 DIAGNOSIS — F0390 Unspecified dementia without behavioral disturbance: Secondary | ICD-10-CM | POA: Diagnosis not present

## 2017-03-25 DIAGNOSIS — I110 Hypertensive heart disease with heart failure: Secondary | ICD-10-CM | POA: Diagnosis not present

## 2017-03-25 DIAGNOSIS — J441 Chronic obstructive pulmonary disease with (acute) exacerbation: Secondary | ICD-10-CM | POA: Diagnosis not present

## 2017-03-25 DIAGNOSIS — M4844XD Fatigue fracture of vertebra, thoracic region, subsequent encounter for fracture with routine healing: Secondary | ICD-10-CM | POA: Diagnosis not present

## 2017-03-25 DIAGNOSIS — I509 Heart failure, unspecified: Secondary | ICD-10-CM | POA: Diagnosis not present

## 2017-03-25 DIAGNOSIS — M353 Polymyalgia rheumatica: Secondary | ICD-10-CM | POA: Diagnosis not present

## 2017-03-25 DIAGNOSIS — Z7952 Long term (current) use of systemic steroids: Secondary | ICD-10-CM | POA: Diagnosis not present

## 2017-03-25 DIAGNOSIS — I442 Atrioventricular block, complete: Secondary | ICD-10-CM | POA: Diagnosis not present

## 2017-03-25 DIAGNOSIS — I480 Paroxysmal atrial fibrillation: Secondary | ICD-10-CM | POA: Diagnosis not present

## 2017-03-25 DIAGNOSIS — Z950 Presence of cardiac pacemaker: Secondary | ICD-10-CM | POA: Diagnosis not present

## 2017-03-27 ENCOUNTER — Telehealth: Payer: Self-pay | Admitting: Pulmonary Disease

## 2017-03-27 DIAGNOSIS — R262 Difficulty in walking, not elsewhere classified: Secondary | ICD-10-CM | POA: Diagnosis not present

## 2017-03-27 DIAGNOSIS — Z4789 Encounter for other orthopedic aftercare: Secondary | ICD-10-CM | POA: Diagnosis not present

## 2017-03-27 DIAGNOSIS — J9601 Acute respiratory failure with hypoxia: Secondary | ICD-10-CM | POA: Diagnosis not present

## 2017-03-27 DIAGNOSIS — S22070D Wedge compression fracture of T9-T10 vertebra, subsequent encounter for fracture with routine healing: Secondary | ICD-10-CM | POA: Diagnosis not present

## 2017-03-27 DIAGNOSIS — I5032 Chronic diastolic (congestive) heart failure: Secondary | ICD-10-CM | POA: Diagnosis not present

## 2017-03-27 DIAGNOSIS — J441 Chronic obstructive pulmonary disease with (acute) exacerbation: Secondary | ICD-10-CM | POA: Diagnosis not present

## 2017-03-27 DIAGNOSIS — M353 Polymyalgia rheumatica: Secondary | ICD-10-CM | POA: Diagnosis not present

## 2017-03-27 DIAGNOSIS — M6281 Muscle weakness (generalized): Secondary | ICD-10-CM | POA: Diagnosis not present

## 2017-03-27 NOTE — Telephone Encounter (Signed)
Spoke with [facility], who request order stating that was is to stop Symbicort and start Trelegy. OV note from 03/24/17 has been sent to provided fax number, as instructions are stated on note. Nothing further needed.

## 2017-03-28 ENCOUNTER — Other Ambulatory Visit: Payer: Self-pay | Admitting: Cardiovascular Disease

## 2017-03-28 DIAGNOSIS — J449 Chronic obstructive pulmonary disease, unspecified: Secondary | ICD-10-CM | POA: Diagnosis not present

## 2017-03-28 DIAGNOSIS — M353 Polymyalgia rheumatica: Secondary | ICD-10-CM | POA: Diagnosis not present

## 2017-03-28 DIAGNOSIS — I5032 Chronic diastolic (congestive) heart failure: Secondary | ICD-10-CM | POA: Diagnosis not present

## 2017-03-28 DIAGNOSIS — Z23 Encounter for immunization: Secondary | ICD-10-CM | POA: Diagnosis not present

## 2017-03-29 DIAGNOSIS — J441 Chronic obstructive pulmonary disease with (acute) exacerbation: Secondary | ICD-10-CM | POA: Diagnosis not present

## 2017-03-29 DIAGNOSIS — J9601 Acute respiratory failure with hypoxia: Secondary | ICD-10-CM | POA: Diagnosis not present

## 2017-03-30 DIAGNOSIS — J9601 Acute respiratory failure with hypoxia: Secondary | ICD-10-CM | POA: Diagnosis not present

## 2017-03-30 DIAGNOSIS — J441 Chronic obstructive pulmonary disease with (acute) exacerbation: Secondary | ICD-10-CM | POA: Diagnosis not present

## 2017-04-02 ENCOUNTER — Emergency Department (HOSPITAL_COMMUNITY): Payer: Medicare Other

## 2017-04-02 ENCOUNTER — Encounter (HOSPITAL_COMMUNITY): Payer: Self-pay

## 2017-04-02 ENCOUNTER — Inpatient Hospital Stay (HOSPITAL_COMMUNITY)
Admission: EM | Admit: 2017-04-02 | Discharge: 2017-04-04 | DRG: 193 | Disposition: A | Discharge status: Home Health | Payer: Medicare Other | Attending: Internal Medicine | Admitting: Internal Medicine

## 2017-04-02 DIAGNOSIS — Z823 Family history of stroke: Secondary | ICD-10-CM

## 2017-04-02 DIAGNOSIS — Z66 Do not resuscitate: Secondary | ICD-10-CM | POA: Diagnosis present

## 2017-04-02 DIAGNOSIS — Z87891 Personal history of nicotine dependence: Secondary | ICD-10-CM

## 2017-04-02 DIAGNOSIS — I499 Cardiac arrhythmia, unspecified: Secondary | ICD-10-CM | POA: Diagnosis not present

## 2017-04-02 DIAGNOSIS — Z950 Presence of cardiac pacemaker: Secondary | ICD-10-CM | POA: Diagnosis not present

## 2017-04-02 DIAGNOSIS — J440 Chronic obstructive pulmonary disease with acute lower respiratory infection: Secondary | ICD-10-CM | POA: Diagnosis present

## 2017-04-02 DIAGNOSIS — J189 Pneumonia, unspecified organism: Principal | ICD-10-CM | POA: Diagnosis present

## 2017-04-02 DIAGNOSIS — Y95 Nosocomial condition: Secondary | ICD-10-CM | POA: Diagnosis present

## 2017-04-02 DIAGNOSIS — M353 Polymyalgia rheumatica: Secondary | ICD-10-CM | POA: Diagnosis present

## 2017-04-02 DIAGNOSIS — I480 Paroxysmal atrial fibrillation: Secondary | ICD-10-CM | POA: Diagnosis present

## 2017-04-02 DIAGNOSIS — R0602 Shortness of breath: Secondary | ICD-10-CM | POA: Diagnosis not present

## 2017-04-02 DIAGNOSIS — Z7901 Long term (current) use of anticoagulants: Secondary | ICD-10-CM | POA: Diagnosis not present

## 2017-04-02 DIAGNOSIS — J438 Other emphysema: Secondary | ICD-10-CM | POA: Diagnosis not present

## 2017-04-02 DIAGNOSIS — Z7952 Long term (current) use of systemic steroids: Secondary | ICD-10-CM | POA: Diagnosis not present

## 2017-04-02 DIAGNOSIS — R061 Stridor: Secondary | ICD-10-CM | POA: Diagnosis not present

## 2017-04-02 DIAGNOSIS — I5033 Acute on chronic diastolic (congestive) heart failure: Secondary | ICD-10-CM | POA: Diagnosis not present

## 2017-04-02 DIAGNOSIS — I10 Essential (primary) hypertension: Secondary | ICD-10-CM | POA: Diagnosis present

## 2017-04-02 DIAGNOSIS — Z79899 Other long term (current) drug therapy: Secondary | ICD-10-CM

## 2017-04-02 DIAGNOSIS — Z7951 Long term (current) use of inhaled steroids: Secondary | ICD-10-CM

## 2017-04-02 DIAGNOSIS — F0390 Unspecified dementia without behavioral disturbance: Secondary | ICD-10-CM | POA: Diagnosis present

## 2017-04-02 DIAGNOSIS — Z7983 Long term (current) use of bisphosphonates: Secondary | ICD-10-CM | POA: Diagnosis not present

## 2017-04-02 DIAGNOSIS — R9431 Abnormal electrocardiogram [ECG] [EKG]: Secondary | ICD-10-CM | POA: Diagnosis not present

## 2017-04-02 DIAGNOSIS — R001 Bradycardia, unspecified: Secondary | ICD-10-CM | POA: Diagnosis not present

## 2017-04-02 DIAGNOSIS — M419 Scoliosis, unspecified: Secondary | ICD-10-CM | POA: Diagnosis present

## 2017-04-02 DIAGNOSIS — I110 Hypertensive heart disease with heart failure: Secondary | ICD-10-CM | POA: Diagnosis present

## 2017-04-02 DIAGNOSIS — J449 Chronic obstructive pulmonary disease, unspecified: Secondary | ICD-10-CM | POA: Diagnosis present

## 2017-04-02 LAB — CBC
HEMATOCRIT: 39.8 % (ref 36.0–46.0)
HEMOGLOBIN: 12.4 g/dL (ref 12.0–15.0)
MCH: 28 pg (ref 26.0–34.0)
MCHC: 31.2 g/dL (ref 30.0–36.0)
MCV: 89.8 fL (ref 78.0–100.0)
Platelets: 288 10*3/uL (ref 150–400)
RBC: 4.43 MIL/uL (ref 3.87–5.11)
RDW: 15 % (ref 11.5–15.5)
WBC: 25.2 10*3/uL — ABNORMAL HIGH (ref 4.0–10.5)

## 2017-04-02 LAB — BASIC METABOLIC PANEL
Anion gap: 8 (ref 5–15)
BUN: 17 mg/dL (ref 6–20)
CHLORIDE: 102 mmol/L (ref 101–111)
CO2: 29 mmol/L (ref 22–32)
CREATININE: 0.97 mg/dL (ref 0.44–1.00)
Calcium: 8.9 mg/dL (ref 8.9–10.3)
GFR calc non Af Amer: 52 mL/min — ABNORMAL LOW (ref >60)
Glucose, Bld: 108 mg/dL — ABNORMAL HIGH (ref 65–99)
POTASSIUM: 4.3 mmol/L (ref 3.5–5.1)
SODIUM: 139 mmol/L (ref 135–145)

## 2017-04-02 LAB — I-STAT TROPONIN, ED: TROPONIN I, POC: 0 ng/mL (ref 0.00–0.08)

## 2017-04-02 LAB — BRAIN NATRIURETIC PEPTIDE: B NATRIURETIC PEPTIDE 5: 722.3 pg/mL — AB (ref 0.0–100.0)

## 2017-04-02 MED ORDER — DEXTROSE 5 % IV SOLN
2.0000 g | Freq: Once | INTRAVENOUS | Status: AC
  Administered 2017-04-02: 2 g via INTRAVENOUS
  Filled 2017-04-02: qty 2

## 2017-04-02 MED ORDER — APIXABAN 2.5 MG PO TABS
2.5000 mg | ORAL_TABLET | Freq: Two times a day (BID) | ORAL | Status: DC
  Administered 2017-04-02 – 2017-04-04 (×4): 2.5 mg via ORAL
  Filled 2017-04-02 (×4): qty 1

## 2017-04-02 MED ORDER — ONDANSETRON HCL 4 MG PO TABS: 4.0000 mg | ORAL_TABLET | Freq: Four times a day (QID) | ORAL | Status: DC | PRN

## 2017-04-02 MED ORDER — ESCITALOPRAM OXALATE 10 MG PO TABS
10.0000 mg | ORAL_TABLET | Freq: Every day | ORAL | Status: DC
  Administered 2017-04-03 – 2017-04-04 (×2): 10 mg via ORAL
  Filled 2017-04-02 (×2): qty 1

## 2017-04-02 MED ORDER — ALBUTEROL SULFATE (2.5 MG/3ML) 0.083% IN NEBU
5.0000 mg | INHALATION_SOLUTION | Freq: Once | RESPIRATORY_TRACT | Status: AC
  Administered 2017-04-02: 5 mg via RESPIRATORY_TRACT
  Filled 2017-04-02: qty 6

## 2017-04-02 MED ORDER — MIRTAZAPINE 15 MG PO TABS
7.5000 mg | ORAL_TABLET | Freq: Every day | ORAL | Status: DC
  Administered 2017-04-02 – 2017-04-03 (×2): 7.5 mg via ORAL
  Filled 2017-04-02 (×2): qty 1

## 2017-04-02 MED ORDER — MIDODRINE HCL 2.5 MG PO TABS
2.5000 mg | ORAL_TABLET | Freq: Every day | ORAL | Status: DC | PRN
  Filled 2017-04-02: qty 1

## 2017-04-02 MED ORDER — DEXTROSE 5 % IV SOLN: 1.0000 g | Freq: Once | INTRAVENOUS | Status: DC

## 2017-04-02 MED ORDER — ACETAMINOPHEN 325 MG PO TABS: 650.0000 mg | ORAL_TABLET | Freq: Four times a day (QID) | ORAL | Status: DC | PRN

## 2017-04-02 MED ORDER — UMECLIDINIUM-VILANTEROL 62.5-25 MCG/INH IN AEPB
1.0000 | INHALATION_SPRAY | Freq: Every day | RESPIRATORY_TRACT | Status: DC
  Administered 2017-04-03 – 2017-04-04 (×2): 1 via RESPIRATORY_TRACT
  Filled 2017-04-02: qty 14

## 2017-04-02 MED ORDER — FLUTICASONE-UMECLIDIN-VILANT 100-62.5-25 MCG/INH IN AEPB: 1.0000 | INHALATION_SPRAY | Freq: Every day | RESPIRATORY_TRACT | Status: DC

## 2017-04-02 MED ORDER — BUDESONIDE 0.25 MG/2ML IN SUSP
0.2500 mg | Freq: Two times a day (BID) | RESPIRATORY_TRACT | Status: DC
  Administered 2017-04-03 – 2017-04-04 (×3): 0.25 mg via RESPIRATORY_TRACT
  Filled 2017-04-02 (×3): qty 2

## 2017-04-02 MED ORDER — DEXTROSE 5 % IV SOLN: 500.0000 mg | Freq: Once | INTRAVENOUS | Status: DC

## 2017-04-02 MED ORDER — FUROSEMIDE 10 MG/ML IJ SOLN
20.0000 mg | Freq: Once | INTRAMUSCULAR | Status: AC
  Administered 2017-04-02: 20 mg via INTRAVENOUS
  Filled 2017-04-02: qty 4

## 2017-04-02 MED ORDER — FUROSEMIDE 40 MG PO TABS: 40.0000 mg | ORAL_TABLET | Freq: Every day | ORAL | Status: DC | PRN

## 2017-04-02 MED ORDER — PREDNISONE 5 MG PO TABS
10.0000 mg | ORAL_TABLET | Freq: Every day | ORAL | Status: DC
  Administered 2017-04-03 – 2017-04-04 (×2): 10 mg via ORAL
  Filled 2017-04-02 (×2): qty 2

## 2017-04-02 MED ORDER — IPRATROPIUM BROMIDE 0.02 % IN SOLN: 0.5000 mg | Freq: Four times a day (QID) | RESPIRATORY_TRACT | Status: DC | PRN

## 2017-04-02 MED ORDER — HYDROCODONE-ACETAMINOPHEN 5-325 MG PO TABS
1.0000 | ORAL_TABLET | Freq: Four times a day (QID) | ORAL | Status: DC | PRN
Start: 2017-04-02 — End: 2017-04-04

## 2017-04-02 MED ORDER — METOPROLOL SUCCINATE ER 25 MG PO TB24
25.0000 mg | ORAL_TABLET | Freq: Every day | ORAL | Status: DC
  Administered 2017-04-03: 25 mg via ORAL
  Filled 2017-04-02 (×2): qty 1

## 2017-04-02 MED ORDER — DOCUSATE SODIUM 100 MG PO CAPS
200.0000 mg | ORAL_CAPSULE | Freq: Every day | ORAL | Status: DC
  Administered 2017-04-03: 200 mg via ORAL
  Filled 2017-04-02 (×2): qty 2

## 2017-04-02 MED ORDER — ALBUTEROL SULFATE (2.5 MG/3ML) 0.083% IN NEBU: 2.5000 mg | INHALATION_SOLUTION | Freq: Four times a day (QID) | RESPIRATORY_TRACT | Status: DC | PRN

## 2017-04-02 MED ORDER — VANCOMYCIN HCL IN DEXTROSE 1-5 GM/200ML-% IV SOLN
1000.0000 mg | INTRAVENOUS | Status: DC
  Administered 2017-04-03: 1000 mg via INTRAVENOUS
  Filled 2017-04-02: qty 200

## 2017-04-02 MED ORDER — ALPRAZOLAM 0.25 MG PO TABS
0.2500 mg | ORAL_TABLET | Freq: Every day | ORAL | Status: DC
  Administered 2017-04-02 – 2017-04-03 (×2): 0.25 mg via ORAL
  Filled 2017-04-02 (×2): qty 1

## 2017-04-02 MED ORDER — DEXTROSE 5 % IV SOLN
2.0000 g | INTRAVENOUS | Status: DC
  Administered 2017-04-03: 2 g via INTRAVENOUS
  Filled 2017-04-02 (×2): qty 2

## 2017-04-02 MED ORDER — POTASSIUM CHLORIDE CRYS ER 20 MEQ PO TBCR: 20.0000 meq | EXTENDED_RELEASE_TABLET | Freq: Every day | ORAL | Status: DC | PRN

## 2017-04-02 MED ORDER — VANCOMYCIN HCL IN DEXTROSE 1-5 GM/200ML-% IV SOLN
1000.0000 mg | Freq: Once | INTRAVENOUS | Status: AC
  Administered 2017-04-02: 1000 mg via INTRAVENOUS
  Filled 2017-04-02: qty 200

## 2017-04-02 NOTE — Progress Notes (Signed)
Pharmacy Antibiotic Note  [name] is a 83 y.o. female admitted on 04/02/2017 with pneumonia.  Pharmacy has been consulted for Vancomycin, cefepime dosing.  Plan: Vancomycin 1gm iv x1, then 1gm iv q24hr (goal 15-20) Cefepime 2gm iv x1, then 2gm iv q24hr   Height: 5\' 5"  (165.1 cm) Weight: 139 lb (63 kg) IBW/kg (Calculated) : 57  Temp (24hrs), Avg:99 F (37.2 C), Min:98.8 F (37.1 C), Max:99.1 F (37.3 C)   Recent Labs Lab 04/02/17 1859  WBC 25.2*  CREATININE 0.97    Estimated Creatinine Clearance: 38.8 mL/min (by C-G formula based on SCr of 0.97 mg/dL).    No Known Allergies  Antimicrobials this admission: Vancomycin 04/02/2017 >> Cefepime 04/02/2017 >>   Dose adjustments this admission: -  Microbiology results: Pending   Thank you for allowing pharmacy to be a part of this patient's care.  [name] 04/02/2017 11:09 PM

## 2017-04-02 NOTE — ED Notes (Signed)
Bed: WA16 Expected date:  Expected time:  Means of arrival:  Comments: 

## 2017-04-02 NOTE — ED Notes (Signed)
Hospitalist at bedside 

## 2017-04-02 NOTE — ED Triage Notes (Addendum)
Pt BIB EMS for low O2 from rehab facility.  EMS noted 95% with 2 L/min [place]. EMS reported facility got an initial reading of 84% on room air.  Pt states she laid down after lunch and didn't feel like she was breathing really well, some shortness of breath and a HA. Denies chest pain, weakness, dizziness, N/V.  Pt denies use of home O2. EMS VS: 119/68, HR 74.

## 2017-04-02 NOTE — H&P (Signed)
History and Physical    [name] XKG:[id number] DOB: 1930/10/23 DOA: 04/02/2017  PCP: Debbie Manes, MD   Patient coming from: Home.  I have personally briefly reviewed patient's old medical records in [place]  Chief Complaint: Hypoxia.  HPI: [name] is a 83 y.o. female with medical history significant of sick sinus syndrome, complete heart block, status post pacemaker placement, diastolic CHF, paroxysmal atrial fibrillation, COPD, dermatomyositis,/polymyalgia rheumatica, scoliosis,dementia, hypertension, who is brought to the emergency department from rehabilitation facilitydue to hypoxia of 84%. Patient states that she laid down after lunch and fell dyspneic, which was followed by a headache. She states that she has been short of breath since yesterday. She has been having occasionally productive coughor several days, unknown color sputum, since she has been unable to expectorate it. She denies chest pain, palpitations, dizziness, diaphoresis or recent pitting edema of the lower extremities. She denies complains some mild wheezing, but denies hemoptysis. No abdominal pain, nausea or emesis, diarrhea or constipation. No GU symptoms. No polyuria, polydipsia or blurry vision.  ED Course: initial vital signs in the emergency department temperature 98.58F, pulse 70, respirations 26, blood pressure 149/59 mmHg and O2 sat 94%. The patient received a DuoNeb, furosemide 20 mg IVP 1 dose, cefepime and vancomycin.  WBC was 25.2 (the patient has been on a prednisone taper), hemoglobin 12.4 g/dL and platelets 288. Her BMP was normal except for blood glucose of 108 mg/dL. Troponin level was normal. BNP was 722.3 pg/mL. Her chest radiograph showed 2 nodular densities in left longsuggesting lingular pneumonia. Please see image and radiology report for further detail.  Review of Systems: As per HPI otherwise 10 point review of systems negative.    Past Medical History:  Diagnosis  Date  . CHB (complete heart block) ([place]) 07/13/2013   Pacemaker dependent  . CHF (congestive heart failure) ([place])   . COPD (chronic obstructive pulmonary disease) ([place])   . DM (dermatomyositis)   . Orthostatic hypotension   . Pacemaker 07/13/2013   Her original pacemaker and the current leads were implanted in 1992. She has had 2 generator change out, most recently in 2010. Her device is a Buyer, retail 2110 non RF dual-chamber pacemaker with a battery longevity estimated at about 7 years. The atrial lead is a St. Jude [id number] and the ventricular lead was a Biotronik [id number].   [name] Paroxysmal atrial fibrillation ([place]) 09/04/2014   on Eliquis  . Polymyalgia rheumatica ([place])   . Scoliosis   . SSS (sick sinus syndrome) ([place]) 07/13/2013  . Syncope   . Systemic hypertension   . Thoracic kyphosis     Past Surgical History:  Procedure Laterality Date  . IR KYPHO THORACIC WITH BONE BIOPSY  01/04/2017  . NM MYOCAR PERF WALL MOTION  09/13/2011   Low risk  . PERMANENT PACEMAKER GENERATOR CHANGE  03/27/2009   [employer]ude  . US ECHOCARDIOGRAPHY  03/28/2012   Mod LAE,mild MR,aortic sclerosis w/mod AI,mod. TR,mild PI,Stage I diastolic dysfunction  . VIDEO BRONCHOSCOPY Bilateral 10/07/2016   Procedure: VIDEO BRONCHOSCOPY WITH FLUORO;  Surgeon: Marshell Garfinkel, MD;  Location: [facility];  Service: Cardiopulmonary;  Laterality: Bilateral;     reports that she has quit smoking. Her smoking use included Cigarettes. She has a 30.00 pack-year smoking history. She has never used smokeless tobacco. She reports that she does not drink alcohol or use drugs.  No Known Allergies  Family History  Problem Relation Age of Onset  . Stroke Mother  Prior to Admission medications   Medication Sig Start Date End Date Taking? Authorizing Provider  acetaminophen (TYLENOL) 325 MG tablet Take 650 mg by mouth every 6 (six) hours as needed for mild pain or moderate pain.   Yes [provider]  acetaminophen (TYLENOL)  650 MG suppository Place 650 mg rectally every 6 (six) hours as needed for moderate pain.   Yes [provider]  alendronate (FOSAMAX) 70 MG tablet Take 70 mg by mouth every Saturday. Take with a full glass of water on an empty stomach.   Yes [provider]  ALPRAZolam (XANAX) 0.25 MG tablet Take 0.25 mg by mouth at bedtime.    Yes [provider]  apixaban (ELIQUIS) 2.5 MG TABS tablet Take 1 tablet (2.5 mg total) by mouth 2 (two) times daily. 03/29/17  Yes Croitoru, Mihai, MD  docusate sodium (COLACE) 100 MG capsule Take 200 mg by mouth daily.   Yes [provider]  escitalopram (LEXAPRO) 10 MG tablet Take 10 mg by mouth daily.   Yes [provider]  Fluticasone-Umeclidin-Vilant (TRELEGY ELLIPTA) 100-62.5-25 MCG/INH AEPB Inhale 1 puff into the lungs daily. 03/24/17  Yes Mannam, Praveen, MD  furosemide (LASIX) 40 MG tablet Take 1 tablet (40 mg total) by mouth daily as needed (for weight gain of greater than 2lbs). 02/21/17  Yes Croitoru, Mihai, MD  HYDROcodone-acetaminophen (NORCO/VICODIN) 5-325 MG tablet Take 1-2 tablets by mouth every 4 (four) hours as needed for moderate pain. 01/05/17  Yes Dessa Phi Chahn-Yang, DO  Ipratropium-Albuterol (COMBIVENT RESPIMAT) 20-100 MCG/ACT AERS respimat Inhale 1 puff into the lungs every 6 (six) hours as needed for wheezing or shortness of breath. 08/29/15  Yes Barton Dubois, MD  metoprolol succinate (TOPROL-XL) 25 MG 24 hr tablet Take 25 mg by mouth daily.    Yes [provider]  midodrine (PROAMATINE) 2.5 MG tablet Take 2.5 mg by mouth as needed (when systolic BP is lower than 100).    Yes [provider]  mirtazapine (REMERON) 15 MG tablet Take 7.5 mg by mouth at bedtime.    Yes [provider]  ondansetron (ZOFRAN) 4 MG tablet Take 4 mg by mouth every 6 (six) hours as needed for nausea or vomiting.   Yes [provider]  potassium chloride SA (K-DUR,KLOR-CON) 20 MEQ tablet Take 20  mEq by mouth daily as needed (when taking Lasix).    Yes [provider]  predniSONE (DELTASONE) 10 MG tablet Take 4 tabs for 3 days, then 3 tabs for 3 days, then 2 tabs for 3 days, then 1 tab as your usual dose. 03/20/17  Yes Dessa Phi Chahn-Yang, DO  benzocaine (ORAJEL) 10 % mucosal gel Use as directed in the mouth or throat 3 (three) times daily as needed for mouth pain. Patient not taking: Reported on 04/02/2017 01/05/17   Dessa Phi Chahn-Yang, DO  doxycycline (VIBRA-TABS) 100 MG tablet Take 100 mg by mouth 2 (two) times daily.    [provider]  ELIQUIS 2.5 MG TABS tablet TAKE 1 TABLET(2.5 MG) BY MOUTH TWICE DAILY Patient not taking: Reported on 04/02/2017 03/09/17   Croitoru, Mihai, MD  SYMBICORT 160-4.5 MCG/ACT inhaler Inhale 2 puffs into the lungs 2 (two) times daily. 02/07/17   [provider]    Physical Exam: Vitals:   04/02/17 2100 04/02/17 2145 04/02/17 2230 04/02/17 2256  BP: (!) 127/49 137/65 (!) 154/58   Pulse: 71 73    Resp: (!) 30 (!) 23 (!) 27 (!) 22  Temp:  99.1 F (37.3 C)  TempSrc:    Oral  SpO2: 91% 96%  92%  Weight:    63 kg (139 lb)  Height:    5\' 5"  (1.651 m)    Constitutional: Elderly, frail female. Eyes: PERRL, lids and conjunctivae normal ENMT: Mucous membranes are mildly dry. Posterior pharynx clear of any exudate or lesions. Neck: normal, supple, no masses, no thyromegaly Respiratory: Decreased breath sounds on bases with bibasilar rales. Normal respiratory effort. No accessory muscle use.  Cardiovascular: Regular rate and rhythm, no murmurs / rubs / gallops. No extremity edema. 2+ pedal pulses. No carotid bruits.  Abdomen: no tenderness, no masses palpated. No hepatosplenomegaly. Bowel sounds positive.  Musculoskeletal: no clubbing / cyanosis.Good ROM, no contractures. Normal muscle tone.  Skin: scattered ecchymoses on extremities. Neurologic: CN 2-12 grossly intact. Sensation intact, DTR normal. Strength 5/5 in all  4.  Psychiatric: Normal judgment and insight. Alert and oriented x 3. Normal mood.    Labs on Admission: I have personally reviewed following labs and imaging studies  CBC:  Recent Labs Lab 04/02/17 1859  WBC 25.2*  HGB 12.4  HCT 39.8  MCV 89.8  PLT 956   Basic Metabolic Panel:  Recent Labs Lab 04/02/17 1859  NA 139  K 4.3  CL 102  CO2 29  GLUCOSE 108*  BUN 17  CREATININE 0.97  CALCIUM 8.9   GFR: Estimated Creatinine Clearance: 38.8 mL/min (by C-G formula based on SCr of 0.97 mg/dL). Liver Function Tests: No results for input(s): AST, ALT, ALKPHOS, BILITOT, PROT, ALBUMIN in the last 168 hours. No results for input(s): LIPASE, AMYLASE in the last 168 hours. No results for input(s): AMMONIA in the last 168 hours. Coagulation Profile: No results for input(s): INR, PROTIME in the last 168 hours. Cardiac Enzymes: No results for input(s): CKTOTAL, CKMB, CKMBINDEX, TROPONINI in the last 168 hours. BNP (last 3 results) No results for input(s): PROBNP in the last 8760 hours. HbA1C: No results for input(s): HGBA1C in the last 72 hours. CBG: No results for input(s): GLUCAP in the last 168 hours. Lipid Profile: No results for input(s): CHOL, HDL, LDLCALC, TRIG, CHOLHDL, LDLDIRECT in the last 72 hours. Thyroid Function Tests: No results for input(s): TSH, T4TOTAL, FREET4, T3FREE, THYROIDAB in the last 72 hours. Anemia Panel: No results for input(s): VITAMINB12, FOLATE, FERRITIN, TIBC, IRON, RETICCTPCT in the last 72 hours. Urine analysis:    Component Value Date/Time   COLORURINE YELLOW 10/11/2016 0033   APPEARANCEUR CLEAR 10/11/2016 0033   LABSPEC 1.016 10/11/2016 0033   PHURINE 6.0 10/11/2016 0033   GLUCOSEU NEGATIVE 10/11/2016 0033   HGBUR SMALL (A) 10/11/2016 0033   BILIRUBINUR NEGATIVE 10/11/2016 0033   KETONESUR NEGATIVE 10/11/2016 0033   PROTEINUR NEGATIVE 10/11/2016 0033   UROBILINOGEN 1.0 01/19/2011 1030   NITRITE NEGATIVE 10/11/2016 0033   LEUKOCYTESUR  NEGATIVE 10/11/2016 0033    Radiological Exams on Admission: Dg Chest 2 View  Result Date: 04/02/2017 CLINICAL DATA:  Short of breath, congestive heart failure EXAM: CHEST  2 VIEW COMPARISON:  Radiograph 03/18/2017 FINDINGS: RIGHT-sided pacemaker. Normal cardiac silhouette. Lungs are hyperinflated. Two nodular densities in the LEFT lung are new from prior. Lungs are hyperinflated. Small effusion on the LEFT. Chronic compression deformities of the midthoracic spine. IMPRESSION: Two nodular densities in the LEFT lung are new. Small effusion on the LEFT. Findings suggest lingular pneumonia as lesions are not seen on please comparison exam. Followup PA and lateral chest X-ray is recommended in 3-4 weeks following trial of antibiotic  therapy to ensure resolution and exclude underlying malignancy. Electronically Signed   By: Suzy Bouchard M.D.   On: 04/02/2017 18:51    ------------------------------------------------------------------- LV EF: 60% -   65%  ------------------------------------------------------------------- Indications:      CHF - 428.0.  ------------------------------------------------------------------- History:   PMH:   Congestive heart failure.  Chronic obstructive pulmonary disease.  Risk factors:  Hypertension. Diabetes mellitus.   ------------------------------------------------------------------- Study Conclusions  - Left ventricle: The cavity size was normal. There was mild   concentric hypertrophy. Systolic function was normal. The   estimated ejection fraction was in the range of 60% to 65%. Wall   motion was normal; there were no regional wall motion   abnormalities. Doppler parameters are consistent with abnormal   left ventricular relaxation (grade 1 diastolic dysfunction).   Doppler parameters are consistent with elevated ventricular   end-diastolic filling pressure. - Aortic valve: Trileaflet; mildly thickened, mildly calcified   leaflets. There was mild  stenosis. There was moderate   regurgitation. Mean gradient (S): 8 mm Hg. Peak gradient (S): 17   mm Hg. Valve area (VTI): 2.11 cm^2. Valve area (Vmax): 2.19 cm^2.   Valve area (Vmean): 2.11 cm^2. - Aortic root: The aortic root was normal in size. - Mitral valve: Structurally normal valve. There was moderate   regurgitation. - Left atrium: The atrium was mildly dilated. - Right ventricle: Systolic function was normal. - Right atrium: The atrium was mildly dilated. - Tricuspid valve: There was moderate regurgitation. - Pulmonary arteries: Systolic pressure was moderately increased.   PA peak pressure: 50 mm Hg (S). - Inferior vena cava: The vessel was dilated. The respirophasic   diameter changes were blunted (< 50%), consistent with elevated   central venous pressure. - Pericardium, extracardiac: There was no pericardial effusion.   EKG: Independently reviewed.   Assessment/Plan Principal Problem:   HCAP (healthcare-associated pneumonia) Admit to telemetry/inpatient. Continue supplemental oxygen. Bronchodilators as needed. Continue cefepime and vancomycin per pharmacy. Follow-up blood cultures and sensitivity. Check sputum Gram stain, culture and sensitivity. Check strep pneumoniae urinary antigen.  Active Problems:   Acute on chronic diastolic CHF (congestive heart failure), NYHA class 1 (HCC) Received furosemide 20 mg IVP 1 dose in the emergency department.  Continue furosemide 40 mg by mouth daily as needed. Continue supplemental oxygen. Continue metoprolol.    Paroxysmal atrial fibrillation (HCC) CHA?DS?-VASc Scorecolonies 5. Continue anticoagulation. Continue metoprolol for rate control.    COPD (chronic obstructive pulmonary disease) (HCC) Continue supplemental oxygen. Bronchodilators as needed.    Benign essential HTN Continue metoprolol succinate 25 mg by mouth daily. Monitor blood pressure.    Polymyalgia rheumatica (HCC) Continue prednisone 10 mg by  mouth daily.    Dementia Supportive care. Continue alprazolam at bedtime. Continue Lexapro 10 mg by mouth daily.     DVT prophylaxis: On Eliquis. Code Status: DO NOT RESUSCITATE. Family Communication:  Disposition Plan: Admit for IV antibiotics for several days. Consults called:  Admission status: Inpatient/telemetry.   Reubin Milan MD [facility] Pager [phone].  If 7PM-7AM, please contact night-coverage [email or web address] Password [email or web address]  04/02/2017, 11:23 PM

## 2017-04-02 NOTE — ED Notes (Signed)
Call report to Junior, [place]

## 2017-04-02 NOTE — ED Provider Notes (Signed)
[facility] DEPT Provider Note   CSN: [id number] Arrival date & time: 04/02/17  1744     History   Chief Complaint Chief Complaint  Patient presents with  . Shortness of Breath    HPI [name] is a 83 y.o. female.  HPI  83 y.o. female with a hx of CHF, COPD, Pacemaker, presents to the Emergency Department today due to hypoxia. Pt from rehab facility. Noted 84% O2 on RA. No hx oxygen usage. EMS arrived and noted 95% on 2L . Pt states that she laid down after lunch and didn't feel like she was breathing very well. Denies chest pain. No N/V. No diaphoresis. Notes mild cough with URI symptoms. Pt with recent admission due to COPD exacerbation with mild CHF component. Denies sick contacts. No fevers. No pain in general. Pt residing at rehab facility temporarily. No other symptoms noted.   Past Medical History:  Diagnosis Date  . CHB (complete heart block) ([place]) 07/13/2013   Pacemaker dependent  . CHF (congestive heart failure) ([place])   . COPD (chronic obstructive pulmonary disease) ([place])   . DM (dermatomyositis)   . Orthostatic hypotension   . Pacemaker 07/13/2013   Her original pacemaker and the current leads were implanted in 1992. She has had 2 generator change out, most recently in 2010. Her device is a Buyer, retail 2110 non RF dual-chamber pacemaker with a battery longevity estimated at about 7 years. The atrial lead is a St. Jude [id number] and the ventricular lead was a Biotronik [id number].   [name] Paroxysmal atrial fibrillation ([place]) 09/04/2014   on Eliquis  . Polymyalgia rheumatica ([place])   . Scoliosis   . SSS (sick sinus syndrome) ([place]) 07/13/2013  . Syncope   . Systemic hypertension   . Thoracic kyphosis     Patient Active Problem List   Diagnosis Date Noted  . Stress fracture of thoracic vertebra 03/19/2017  . Dementia 03/19/2017  . Non-traumatic compression fracture of T9 thoracic vertebra ([place]) 01/02/2017  . Polymyalgia rheumatica ([place])   . Postural dizziness  with near syncope   . Syncope 10/11/2016  . Postural dizziness with presyncope 10/11/2016  . Acute hypoxemic respiratory failure ([place]) 10/05/2016  . Chronic diastolic CHF (congestive heart failure) ([place]) 10/05/2016  . Smoker 10/05/2016  . Closed multiple fractures of right upper extremity with ribs with routine healing 10/05/2016  . HCAP (healthcare-associated pneumonia) 10/31/2015  . COPD (chronic obstructive pulmonary disease) ([place]) 10/31/2015  . Leukocytosis 10/31/2015  . Benign essential HTN 10/31/2015  . Anxiety state   . COPD exacerbation ([place]) 08/26/2015  . Systemic hypertension 08/26/2015  . Hypokalemia 08/26/2015  . Anticoagulant long-term use 08/26/2015  . DM (dermatomyositis) 08/26/2015  . Acute on chronic diastolic CHF (congestive heart failure), NYHA class 1 ([place])   . Sepsis ([place]) 06/07/2015  . CAP (community acquired pneumonia) 06/07/2015  . Acute CHF (congestive heart failure) ([place]) 06/07/2015  . Paroxysmal atrial fibrillation ([place]) 09/04/2014  . Pacemaker 07/13/2013  . CHB (complete heart block) ([place]) 07/13/2013  . SSS (sick sinus syndrome) ([place]) 07/13/2013  . Orthostatic hypotension 07/13/2013  . Aortic insufficiency 07/13/2013    Past Surgical History:  Procedure Laterality Date  . IR KYPHO THORACIC WITH BONE BIOPSY  01/04/2017  . NM MYOCAR PERF WALL MOTION  09/13/2011   Low risk  . PERMANENT PACEMAKER GENERATOR CHANGE  03/27/2009   St.Jude  . US ECHOCARDIOGRAPHY  03/28/2012   Mod LAE,mild MR,aortic sclerosis w/mod AI,mod. TR,mild PI,Stage I diastolic dysfunction  . VIDEO  BRONCHOSCOPY Bilateral 10/07/2016   Procedure: VIDEO BRONCHOSCOPY WITH FLUORO;  Surgeon: Marshell Garfinkel, MD;  Location: [facility];  Service: Cardiopulmonary;  Laterality: Bilateral;    OB History    No data available       Home Medications    Prior to Admission medications   Medication Sig Start Date End Date Taking? Authorizing Provider  alendronate (FOSAMAX) 70 MG tablet Take 70  mg by mouth every Saturday. Take with a full glass of water on an empty stomach.    [provider]  ALPRAZolam Duanne Moron) 0.25 MG tablet Take 0.25 mg by mouth at bedtime.     [provider]  apixaban (ELIQUIS) 2.5 MG TABS tablet Take 1 tablet (2.5 mg total) by mouth 2 (two) times daily. 03/29/17   Croitoru, Mihai, MD  benzocaine (ORAJEL) 10 % mucosal gel Use as directed in the mouth or throat 3 (three) times daily as needed for mouth pain. 01/05/17   Dessa Phi Chahn-Yang, DO  ELIQUIS 2.5 MG TABS tablet TAKE 1 TABLET(2.5 MG) BY MOUTH TWICE DAILY 03/09/17   Croitoru, Mihai, MD  escitalopram (LEXAPRO) 10 MG tablet Take 10 mg by mouth daily.    [provider]  Fluticasone-Umeclidin-Vilant (TRELEGY ELLIPTA) 100-62.5-25 MCG/INH AEPB Inhale 1 puff into the lungs daily. 03/24/17   Mannam, Hart Robinsons, MD  furosemide (LASIX) 40 MG tablet Take 1 tablet (40 mg total) by mouth daily as needed (for weight gain of greater than 2lbs). 02/21/17   Croitoru, Mihai, MD  HYDROcodone-acetaminophen (NORCO/VICODIN) 5-325 MG tablet Take 1-2 tablets by mouth every 4 (four) hours as needed for moderate pain. 01/05/17   Dessa Phi Chahn-Yang, DO  Ipratropium-Albuterol (COMBIVENT RESPIMAT) 20-100 MCG/ACT AERS respimat Inhale 1 puff into the lungs every 6 (six) hours as needed for wheezing or shortness of breath. 08/29/15   Barton Dubois, MD  metoprolol succinate (TOPROL-XL) 25 MG 24 hr tablet Take 25 mg by mouth daily.     [provider]  midodrine (PROAMATINE) 2.5 MG tablet Take 2.5 mg by mouth as needed (when systolic BP is lower than 100).     [provider]  mirtazapine (REMERON) 15 MG tablet Take 7.5 mg by mouth at bedtime.     [provider]  potassium chloride SA (K-DUR,KLOR-CON) 20 MEQ tablet Take 20 mEq by mouth daily as needed (when taking Lasix).     [provider]  predniSONE (DELTASONE) 10 MG tablet Take 10 mg by mouth daily.     [provider]  predniSONE (DELTASONE) 10 MG tablet Take 4 tabs for 3 days, then 3 tabs for 3 days, then 2 tabs for 3 days, then 1 tab as your usual dose. 03/20/17   Dessa Phi Chahn-Yang, DO  SYMBICORT 160-4.5 MCG/ACT inhaler Inhale 2 puffs into the lungs 2 (two) times daily. 02/07/17   [provider]    Family History Family History  Problem Relation Age of Onset  . Stroke Mother     Social History Social History  Substance Use Topics  . Smoking status: Former Smoker    Packs/day: 0.50    Years: 60.00    Types: Cigarettes  . Smokeless tobacco: Never Used     Comment: quit 12/2016  . Alcohol use No     Allergies   Patient has no known allergies.   Review of Systems Review of Systems ROS reviewed and all are negative for acute change except as noted in the HPI.  Physical Exam Updated Vital Signs BP [name])  149/59 (BP Location: Left Arm)   Pulse 70   Temp 98.8 F (37.1 C) (Oral)   Resp (!) 26   SpO2 94%   Physical Exam  Constitutional: She is oriented to person, place, and time. She appears well-developed and well-nourished. No distress.  HENT:  Head: Normocephalic and atraumatic.  Right Ear: Tympanic membrane, external ear and ear canal normal.  Left Ear: Tympanic membrane, external ear and ear canal normal.  Nose: Nose normal.  Mouth/Throat: Uvula is midline, oropharynx is clear and moist and mucous membranes are normal. No trismus in the jaw. No oropharyngeal exudate, posterior oropharyngeal erythema or tonsillar abscesses.  Eyes: Pupils are equal, round, and reactive to light. EOM are normal.  Neck: Normal range of motion. Neck supple. No tracheal deviation present.  Cardiovascular: Normal rate, regular rhythm, S1 normal, S2 normal, normal heart sounds, intact distal pulses and normal pulses.   Pulmonary/Chest: Effort normal. No respiratory distress. She has decreased breath sounds in the right lower field and the left lower field. She has no wheezes. She has no  rhonchi. She has rales in the right lower field and the left lower field.  Abdominal: Normal appearance and bowel sounds are normal. There is no tenderness.  Musculoskeletal: Normal range of motion.  Neurological: She is alert and oriented to person, place, and time.  Skin: Skin is warm and dry.  Psychiatric: She has a normal mood and affect. Her speech is normal and behavior is normal. Thought content normal.  Nursing note and vitals reviewed.    ED Treatments / Results  Labs (all labs ordered are listed, but only abnormal results are displayed) Labs Reviewed  CBC - Abnormal; Notable for the following:       Result Value   WBC 25.2 (*)    All other components within normal limits  BASIC METABOLIC PANEL - Abnormal; Notable for the following:    Glucose, Bld 108 (*)    GFR calc non Af Amer 52 (*)    All other components within normal limits  CULTURE, BLOOD (ROUTINE X 2)  CULTURE, BLOOD (ROUTINE X 2)  BRAIN NATRIURETIC PEPTIDE  I-STAT TROPONIN, ED    EKG  EKG Interpretation None       Radiology Dg Chest 2 View  Result Date: 04/02/2017 CLINICAL DATA:  Short of breath, congestive heart failure EXAM: CHEST  2 VIEW COMPARISON:  Radiograph 03/18/2017 FINDINGS: RIGHT-sided pacemaker. Normal cardiac silhouette. Lungs are hyperinflated. Two nodular densities in the LEFT lung are new from prior. Lungs are hyperinflated. Small effusion on the LEFT. Chronic compression deformities of the midthoracic spine. IMPRESSION: Two nodular densities in the LEFT lung are new. Small effusion on the LEFT. Findings suggest lingular pneumonia as lesions are not seen on please comparison exam. Followup PA and lateral chest X-ray is recommended in 3-4 weeks following trial of antibiotic therapy to ensure resolution and exclude underlying malignancy. Electronically Signed   By: Suzy Bouchard M.D.   On: 04/02/2017 18:51    Procedures Procedures (including critical care time)  Medications Ordered in  ED Medications  ceFEPIme (MAXIPIME) 2 g in dextrose 5 % 50 mL IVPB (not administered)  vancomycin (VANCOCIN) IVPB 1000 mg/200 mL premix (not administered)  albuterol (PROVENTIL) (2.5 MG/3ML) 0.083% nebulizer solution 5 mg (5 mg Nebulization Given 04/02/17 1844)  furosemide (LASIX) injection 20 mg (20 mg Intravenous Given 04/02/17 1900)     Initial Impression / Assessment and Plan / ED Course  I have reviewed the triage vital signs  and the nursing notes.  Pertinent labs & imaging results that were available during my care of the patient were reviewed by me and considered in my medical decision making (see chart for details).  Final Clinical Impressions(s) / ED Diagnoses  {I have reviewed and evaluated the relevant laboratory values. {I have reviewed and evaluated the relevant imaging studies.  {I have reviewed the relevant previous healthcare records.  {I obtained HPI from historian. {Patient discussed with supervising physician.  ED Course:  Assessment: Pt is a 83 y.o. female with a hx of CHF, COPD, Pacemaker, presents to the Emergency Department today due to hypoxia. Pt from rehab facility. Noted 84% O2 on RA. No hx oxygen usage. EMS arrived and noted 95% on 2L [place]. Pt states that she laid down after lunch and didn't feel like she was breathing very well. Denies chest pain. No N/V. No diaphoresis. Notes mild cough with URI symptoms. Pt with recent admission due to COPD exacerbation with mild CHF component. Denies sick contacts. No fevers. No pain in general. Pt residing at rehab facility temporarily. On exam, pt in NAD. Nontoxic/nonseptic appearing. VSS. Afebrile. Lungs with bilateral rales. No wheeze. Heart RRR. Abdomen nontender soft. CBC with WBC 25. BMP unremarkable. Trop negative. BNP pending. CXR shows left lingular pneumonia. Recent hospitalization x 2 weeks ago. Given Vanc/Cefepime for HCAP. Given neb treatment and 20mg  IV lasix in ED. Seen by supervising physician. Plan is to  [facility].  Disposition/Plan:  Admit Pt acknowledges and agrees with plan  Supervising Physician Julianne Rice, MD  Final diagnoses:  HCAP (healthcare-associated pneumonia)    New Prescriptions New Prescriptions   No medications on file     Shary Decamp, Hershal Coria 04/02/17 Rivka Safer, MD 04/04/17 [phone]

## 2017-04-02 NOTE — ED Notes (Signed)
Patient transported to X-ray 

## 2017-04-02 NOTE — Progress Notes (Signed)
A consult was received from an ED physician for vanc per pharmacy dosing.  The patient's profile has been reviewed for ht/wt/allergies/indication/available labs.   A one time order has been placed for 1g.  Further antibiotics/pharmacy consults should be ordered by admitting physician if indicated.                       Thank you, Kara Mead 04/02/2017  7:14 PM

## 2017-04-03 ENCOUNTER — Encounter: Payer: Self-pay | Admitting: Interventional Radiology

## 2017-04-03 DIAGNOSIS — I5033 Acute on chronic diastolic (congestive) heart failure: Secondary | ICD-10-CM

## 2017-04-03 DIAGNOSIS — J189 Pneumonia, unspecified organism: Principal | ICD-10-CM

## 2017-04-03 DIAGNOSIS — J438 Other emphysema: Secondary | ICD-10-CM

## 2017-04-03 DIAGNOSIS — I10 Essential (primary) hypertension: Secondary | ICD-10-CM

## 2017-04-03 LAB — COMPREHENSIVE METABOLIC PANEL
ALK PHOS: 67 U/L (ref 38–126)
ALT: 17 U/L (ref 14–54)
AST: 20 U/L (ref 15–41)
Albumin: 2.8 g/dL — ABNORMAL LOW (ref 3.5–5.0)
Anion gap: 9 (ref 5–15)
BILIRUBIN TOTAL: 1.2 mg/dL (ref 0.3–1.2)
BUN: 17 mg/dL (ref 6–20)
CO2: 30 mmol/L (ref 22–32)
CREATININE: 0.85 mg/dL (ref 0.44–1.00)
Calcium: 8 mg/dL — ABNORMAL LOW (ref 8.9–10.3)
Chloride: 101 mmol/L (ref 101–111)
Glucose, Bld: 103 mg/dL — ABNORMAL HIGH (ref 65–99)
Potassium: 4.2 mmol/L (ref 3.5–5.1)
Sodium: 140 mmol/L (ref 135–145)
TOTAL PROTEIN: 5.7 g/dL — AB (ref 6.5–8.1)

## 2017-04-03 LAB — CBC WITH DIFFERENTIAL/PLATELET
BASOS ABS: 0 10*3/uL (ref 0.0–0.1)
Basophils Relative: 0 %
EOS PCT: 0 %
Eosinophils Absolute: 0 10*3/uL (ref 0.0–0.7)
HCT: 35.5 % — ABNORMAL LOW (ref 36.0–46.0)
Hemoglobin: 11.3 g/dL — ABNORMAL LOW (ref 12.0–15.0)
LYMPHS ABS: 2 10*3/uL (ref 0.7–4.0)
LYMPHS PCT: 8 %
MCH: 28.7 pg (ref 26.0–34.0)
MCHC: 31.8 g/dL (ref 30.0–36.0)
MCV: 90.1 fL (ref 78.0–100.0)
Monocytes Absolute: 1.8 10*3/uL — ABNORMAL HIGH (ref 0.1–1.0)
Monocytes Relative: 7 %
NEUTROS ABS: 20.1 10*3/uL — AB (ref 1.7–7.7)
Neutrophils Relative %: 85 %
Platelets: 253 10*3/uL (ref 150–400)
RBC: 3.94 MIL/uL (ref 3.87–5.11)
RDW: 15.3 % (ref 11.5–15.5)
WBC: 23.9 10*3/uL — AB (ref 4.0–10.5)

## 2017-04-03 LAB — INFLUENZA PANEL BY PCR (TYPE A & B)
INFLAPCR: NEGATIVE
INFLBPCR: NEGATIVE

## 2017-04-03 LAB — STREP PNEUMONIAE URINARY ANTIGEN: STREP PNEUMO URINARY ANTIGEN: NEGATIVE

## 2017-04-03 NOTE — Progress Notes (Signed)
PROGRESS NOTE    [name]  ELF:[id number] DOB: December 23, 1930 DOA: 04/02/2017 PCP: Lajean Manes, MD   Brief Narrative: [name] is a 83 y.o. female with medical history significant of sick sinus syndrome, complete heart block, status post pacemaker placement, diastolic CHF, paroxysmal atrial fibrillation, COPD, dermatomyositis,/polymyalgia rheumatica, scoliosis,dementia, hypertension, who is brought to the emergency department from rehabilitation facilitydue to hypoxia of 84%. SHE was found to have health care associated pneumonia.   Assessment & Plan:   Principal Problem:   HCAP (healthcare-associated pneumonia) Active Problems:   Paroxysmal atrial fibrillation (HCC)   Acute on chronic diastolic CHF (congestive heart failure), NYHA class 1 (HCC)   COPD (chronic obstructive pulmonary disease) (HCC)   Benign essential HTN   Polymyalgia rheumatica ([place])   Dementia   Health care associated pneumonia:  Admit for IV antibiotics.  Blood cultures neg so far.  Checking sputum cultures.    Acute on chronic diastolic heart failure.  Resume lasix.  [place] oxygen to keep sats greater than 90%.   PAF: Resume anti coagulation.    COPD: No wheezing heard.  Resume duonebs as needed.    Hypertension:  Well controlled.    PMR:  STABLE.    Mild dementia:  Stable.      DVT prophylaxis: SCD'S  Code Status: DNR Family Communication: none at bedside.  Disposition Plan: pending further eval.   Consultants:   None.    Procedures: NONE.   Antimicrobials: vancomycin and cefepime.   Subjective: Breathing and cough is improved.   Objective: Vitals:   04/02/17 2230 04/02/17 2256 04/03/17 0822 04/03/17 1540  BP: (!) 154/58   (!) 116/59  Pulse:    62  Resp: (!) 27 (!) 22  18  Temp:  99.1 F (37.3 C)  98.3 F (36.8 C)  TempSrc:  Oral  Oral  SpO2:  92% 94% 93%  Weight:  63 kg (139 lb)    Height:  5\' 5"  (1.651 m)      Intake/Output Summary (Last 24 hours)  at 04/03/17 1726 Last data filed at 04/03/17 1501  Gross per 24 hour  Intake              240 ml  Output             1800 ml  Net            -1560 ml   Filed Weights   04/02/17 2256  Weight: 63 kg (139 lb)    Examination:  General exam: Appears calm and comfortable on 2 lit of [place] oxygen.  Respiratory system: Clear to auscultation. Respiratory effort normal. Cardiovascular system: S1 & S2 heard, RRR. No JVD, murmurs, rubs, gallops or clicks. No pedal edema. Gastrointestinal system: Abdomen is nondistended, soft and nontender. No organomegaly or masses felt. Normal bowel sounds heard. Central nervous system: Alert and oriented. No focal neurological deficits. Extremities: Symmetric 5 x 5 power. Skin: No rashes, lesions or ulcers Psychiatry: Judgement and insight appear normal. Mood & affect appropriate.     Data Reviewed: I have personally reviewed following labs and imaging studies  CBC:  Recent Labs Lab 04/02/17 1859 04/03/17 0520  WBC 25.2* 23.9*  NEUTROABS  --  20.1*  HGB 12.4 11.3*  HCT 39.8 35.5*  MCV 89.8 90.1  PLT 288 585   Basic Metabolic Panel:  Recent Labs Lab 04/02/17 1859 04/03/17 0520  NA 139 140  K 4.3 4.2  CL 102 101  CO2 29 30  GLUCOSE  108* 103*  BUN 17 17  CREATININE 0.97 0.85  CALCIUM 8.9 8.0*   GFR: Estimated Creatinine Clearance: 44.3 mL/min (by C-G formula based on SCr of 0.85 mg/dL). Liver Function Tests:  Recent Labs Lab 04/03/17 0520  AST 20  ALT 17  ALKPHOS 67  BILITOT 1.2  PROT 5.7*  ALBUMIN 2.8*   No results for input(s): LIPASE, AMYLASE in the last 168 hours. No results for input(s): AMMONIA in the last 168 hours. Coagulation Profile: No results for input(s): INR, PROTIME in the last 168 hours. Cardiac Enzymes: No results for input(s): CKTOTAL, CKMB, CKMBINDEX, TROPONINI in the last 168 hours. BNP (last 3 results) No results for input(s): PROBNP in the last 8760 hours. HbA1C: No results for input(s): HGBA1C in  the last 72 hours. CBG: No results for input(s): GLUCAP in the last 168 hours. Lipid Profile: No results for input(s): CHOL, HDL, LDLCALC, TRIG, CHOLHDL, LDLDIRECT in the last 72 hours. Thyroid Function Tests: No results for input(s): TSH, T4TOTAL, FREET4, T3FREE, THYROIDAB in the last 72 hours. Anemia Panel: No results for input(s): VITAMINB12, FOLATE, FERRITIN, TIBC, IRON, RETICCTPCT in the last 72 hours. Sepsis Labs: No results for input(s): PROCALCITON, LATICACIDVEN in the last 168 hours.  Recent Results (from the past 240 hour(s))  Blood culture (routine x 2)     Status: None (Preliminary result)   Collection Time: 04/02/17  6:57 PM  Result Value Ref Range Status   Specimen Description BLOOD LEFT ANTECUBITAL  Final   Special Requests   Final    BOTTLES DRAWN AEROBIC AND ANAEROBIC Blood Culture adequate volume   Culture   Final    NO GROWTH < 12 HOURS Performed at [facility], [address] [place], [place] [address]    Report Status PENDING  Incomplete  Blood culture (routine x 2)     Status: None (Preliminary result)   Collection Time: 04/02/17  7:57 PM  Result Value Ref Range Status   Specimen Description BLOOD RIGHT WRIST  Final   Special Requests   Final    BOTTLES DRAWN AEROBIC AND ANAEROBIC Blood Culture adequate volume   Culture   Final    NO GROWTH < 12 HOURS Performed at [facility], [place] [address] [place], [place] [address]    Report Status PENDING  Incomplete         Radiology Studies: Dg Chest 2 View  Result Date: 04/02/2017 CLINICAL DATA:  Short of breath, congestive heart failure EXAM: CHEST  2 VIEW COMPARISON:  Radiograph 03/18/2017 FINDINGS: RIGHT-sided pacemaker. Normal cardiac silhouette. Lungs are hyperinflated. Two nodular densities in the LEFT lung are new from prior. Lungs are hyperinflated. Small effusion on the LEFT. Chronic compression deformities of the midthoracic spine. IMPRESSION: Two nodular densities in the LEFT lung  are new. Small effusion on the LEFT. Findings suggest lingular pneumonia as lesions are not seen on please comparison exam. Followup PA and lateral chest X-ray is recommended in 3-4 weeks following trial of antibiotic therapy to ensure resolution and exclude underlying malignancy. Electronically Signed   By: Suzy Bouchard M.D.   On: 04/02/2017 18:51        Scheduled Meds: . ALPRAZolam  0.25 mg Oral QHS  . apixaban  2.5 mg Oral BID  . budesonide (PULMICORT) nebulizer solution  0.25 mg Nebulization BID  . docusate sodium  200 mg Oral Daily  . escitalopram  10 mg Oral Daily  . metoprolol succinate  25 mg Oral Daily  .  mirtazapine  7.5 mg Oral QHS  . predniSONE  10 mg Oral Q breakfast  . umeclidinium-vilanterol  1 puff Inhalation Daily   Continuous Infusions: . ceFEPime (MAXIPIME) IV    . vancomycin       LOS: 1 day    Time spent: 35 minutes.     Hosie Poisson, MD [facility] Pager [phone]  If 7PM-7AM, please contact night-coverage [email or web address] Password [email or web address] 04/03/2017, 5:26 PM

## 2017-04-03 NOTE — Care Management Note (Signed)
Case Management Note  Patient Details  Name: [name] MRN: [id number] Date of Birth: December 14, 1930  Subjective/Objective: 83 y/o f admitted w/HCAP. From indep liv. PT cons await recc.On 02-will monitor.                  Action/Plan:d/c plan home.   Expected Discharge Date:                  Expected Discharge Plan:  Home/Self Care  In-House Referral:  Clinical Social Work  Discharge planning Services  CM Consult  Post Acute Care Choice:    Choice offered to:     DME Arranged:    DME Agency:     HH Arranged:    HH Agency:     Status of Service:  In process, will continue to follow  If discussed at Long Length of Stay Meetings, dates discussed:    Additional Comments:  Dessa Phi, RN 04/03/2017, 2:53 PM

## 2017-04-04 ENCOUNTER — Encounter: Payer: Self-pay | Admitting: Pulmonary Disease

## 2017-04-04 DIAGNOSIS — F0390 Unspecified dementia without behavioral disturbance: Secondary | ICD-10-CM

## 2017-04-04 LAB — CBC WITH DIFFERENTIAL/PLATELET
BASOS ABS: 0 10*3/uL (ref 0.0–0.1)
Basophils Relative: 0 %
EOS PCT: 1 %
Eosinophils Absolute: 0.2 10*3/uL (ref 0.0–0.7)
HCT: 37.5 % (ref 36.0–46.0)
HEMOGLOBIN: 11.5 g/dL — AB (ref 12.0–15.0)
LYMPHS ABS: 1.4 10*3/uL (ref 0.7–4.0)
LYMPHS PCT: 11 %
MCH: 28 pg (ref 26.0–34.0)
MCHC: 30.7 g/dL (ref 30.0–36.0)
MCV: 91.5 fL (ref 78.0–100.0)
MONO ABS: 1.3 10*3/uL — AB (ref 0.1–1.0)
MONOS PCT: 10 %
NEUTROS ABS: 10.7 10*3/uL — AB (ref 1.7–7.7)
NEUTROS PCT: 78 %
PLATELETS: 253 10*3/uL (ref 150–400)
RBC: 4.1 MIL/uL (ref 3.87–5.11)
RDW: 15 % (ref 11.5–15.5)
WBC: 13.7 10*3/uL — ABNORMAL HIGH (ref 4.0–10.5)

## 2017-04-04 LAB — MAC SUSCEPTIBILITY BROTH

## 2017-04-04 MED ORDER — SODIUM CHLORIDE 0.9 % IV BOLUS (SEPSIS)
1000.0000 mL | Freq: Once | INTRAVENOUS | Status: AC
  Administered 2017-04-04: 1000 mL via INTRAVENOUS

## 2017-04-04 MED ORDER — LEVOFLOXACIN 500 MG PO TABS: 500.0000 mg | ORAL_TABLET | Freq: Every day | ORAL | 0 refills | Status: AC

## 2017-04-04 MED ORDER — PREDNISONE 10 MG PO TABS: 10.0000 mg | ORAL_TABLET | Freq: Every day | ORAL | Status: DC

## 2017-04-04 MED ORDER — ALBUTEROL SULFATE (2.5 MG/3ML) 0.083% IN NEBU: 2.5000 mg | INHALATION_SOLUTION | Freq: Four times a day (QID) | RESPIRATORY_TRACT | 12 refills | Status: DC | PRN

## 2017-04-04 NOTE — Evaluation (Signed)
Physical Therapy Evaluation Patient Details Name: [name] MRN: [id number] DOB: 1931/03/22 Today's Date: 04/04/2017   History of Present Illness  83 yo female admitted with Pna. Hx of SSS, pacemaker, CHF, DM, COPD, A fib, polymyalgia rheumatica, T9 comp fx s/p kyphoplasty 12/2016, T8 comp fx, orthostatic hypotension, dementia.     Clinical Impression  On eval, pt required Min assist for mobility. She was able to perform a stand pivot with use of the RW. Ambulation was  deferred at time of eval due to low BP-85/56-RN aware. O2 sat >90% on RA. Will follow and progress activity as tolerated. Discussed d/c plan-pt plans to return to ALF where she has been staying temporarily until she regains her independence. She hope to eventually return to her home where she lives alone.     Follow Up Recommendations Home health PT (at ALF)    Equipment Recommendations  None recommended by PT    Recommendations for Other Services       Precautions / Restrictions Precautions Precautions: Fall Precaution Comments: mild memory loss Restrictions Weight Bearing Restrictions: No      Mobility  Bed Mobility Overal bed mobility: Needs Assistance Bed Mobility: Supine to Sit     Supine to sit: Supervision     General bed mobility comments: increased time  Transfers Overall transfer level: Needs assistance Equipment used: Rolling walker (2 wheeled) Transfers: Sit to/from [facility] Sit to Stand: Min assist Stand pivot transfers: Min assist       General transfer comment: Assist to rise, stabilize, control descent. Stand pivot, bed to recliner, with rW.   Ambulation/Gait             General Gait Details: Deferred at time of eval. BP 85/56 while sitting EOB.   Stairs            Wheelchair Mobility    Modified Rankin (Stroke Patients Only)       Balance Overall balance assessment: Needs assistance;History of Falls         Standing balance  support: Bilateral upper extremity supported Standing balance-Leahy Scale: Poor Standing balance comment: requires RW                             Pertinent Vitals/Pain Pain Assessment: No/denies pain    Home Living Family/patient expects to be discharged to:: Assisted living (pt living there temporarily until she returns to baseline) Living Arrangements: Alone             Home Equipment: Walker - 2 wheels;Cane - single point;Bedside commode Additional Comments: daughter lives nearby    Prior Function Level of Independence: Independent with assistive device(s)         Comments: pt is primarily using RW.  Pt states she is using as needed until she is "completely better"     Hand Dominance        Extremity/Trunk Assessment   Upper Extremity Assessment Upper Extremity Assessment: Generalized weakness    Lower Extremity Assessment Lower Extremity Assessment: Generalized weakness    Cervical / Trunk Assessment Cervical / Trunk Assessment: Kyphotic  Communication   Communication: No difficulties  Cognition Arousal/Alertness: Awake/alert Behavior During Therapy: WFL for tasks assessed/performed Overall Cognitive Status: Within Functional Limits for tasks assessed  General Comments      Exercises     Assessment/Plan    PT Assessment Patient needs continued PT services  PT Problem List Decreased strength;Decreased mobility;Decreased activity tolerance;Decreased balance;Decreased knowledge of use of DME       PT Treatment Interventions DME instruction;Therapeutic activities;Therapeutic exercise;Gait training;Patient/family education;Functional mobility training;Balance training    PT Goals (Current goals can be found in the Care Plan section)  Acute Rehab PT Goals Patient Stated Goal: Regain independence PT Goal Formulation: With patient Time For Goal Achievement: 04/18/17 Potential to  Achieve Goals: Good    Frequency Min 3X/week   Barriers to discharge        Co-evaluation               AM-PAC PT "6 Clicks" Daily Activity  Outcome Measure Difficulty turning over in bed (including adjusting bedclothes, sheets and blankets)?: None Difficulty moving from lying on back to sitting on the side of the bed? : None Difficulty sitting down on and standing up from a chair with arms (e.g., wheelchair, bedside commode, etc,.)?: Unable Help needed moving to and from a bed to chair (including a wheelchair)?: A Little Help needed walking in hospital room?: A Little Help needed climbing 3-5 steps with a railing? : A Little 6 Click Score: 18    End of Session   Activity Tolerance: Patient tolerated treatment well Patient left: in chair;with call bell/phone within reach   PT Visit Diagnosis: Muscle weakness (generalized) (M62.81);Difficulty in walking, not elsewhere classified (R26.2)    Time: 8270-7867 PT Time Calculation (min) (ACUTE ONLY): 28 min   Charges:   PT Evaluation $PT Eval Low Complexity: 1 Low     PT G Codes:          Weston Anna, MPT Pager: [phone]

## 2017-04-04 NOTE — Progress Notes (Signed)
BP 85/56, HR 77. Asymptomatic. MD notified.

## 2017-04-04 NOTE — Care Management Note (Signed)
Case Management Note  Patient Details  Name: [name] MRN: [id number] Date of Birth: 30-Jul-1930  Subjective/Objective:  Spoke to dtr on phone per patient request aobut d/c plans.PT recc HHPT. From ALF-[facility] provides PT there-rep [facility] aware of HHPT. No home 02 needed.No further CM needs.                  Action/Plan:d/c back to ALF w/HHPT   Expected Discharge Date:                  Expected Discharge Plan:  [place]  In-House Referral:  Clinical Social Work  Discharge planning Services  CM Consult  Post Acute Care Choice:    Choice offered to:  Adult Children  DME Arranged:    DME Agency:     HH Arranged:  PT HH Agency:  [facility]  Status of Service:  Completed, signed off  If discussed at [place] of Stay Meetings, dates discussed:    Additional Comments:  Dessa Phi, RN 04/04/2017, 1:49 PM

## 2017-04-05 DIAGNOSIS — J441 Chronic obstructive pulmonary disease with (acute) exacerbation: Secondary | ICD-10-CM | POA: Diagnosis not present

## 2017-04-05 DIAGNOSIS — J9601 Acute respiratory failure with hypoxia: Secondary | ICD-10-CM | POA: Diagnosis not present

## 2017-04-05 NOTE — Telephone Encounter (Signed)
PM- please see email from pt's daughter. I can't tell from the ED notes what they wanted her to be sent home with. Looks like they planned to admit her but did not. Do you want [place] to send rx for neb machine? Thanks!   My mother, [name] DOB: 27-Aug-2030 was just realized from a hospital stay. The Hospitalist sent two prescriptions to [facility] ([place]) for me to pick up. One was Albuterol Sulfate Inhalation Solution. Doesn't she have to have a Nebulizer to use this? If so, she does not have one.    Is she to still use the inhaler you started her on last week as well as the Albuterol? If so, can you prescribe a Nebulizer? I'm not sure how to get back in touch with the Hospitalist.   In the past, she was on Albuterol, but it did not look like this. The medication was more of a canister type solution and I THINK she still has some of that at her house.    Advice???   Thanks!!!

## 2017-04-06 DIAGNOSIS — J441 Chronic obstructive pulmonary disease with (acute) exacerbation: Secondary | ICD-10-CM | POA: Diagnosis not present

## 2017-04-06 DIAGNOSIS — J9601 Acute respiratory failure with hypoxia: Secondary | ICD-10-CM | POA: Diagnosis not present

## 2017-04-06 NOTE — Telephone Encounter (Signed)
E-mail sent back to patient's daughter asking if patient is already established with a DME for o2 or sleep so that neb machine order can be sent to that company Will await response

## 2017-04-06 NOTE — Telephone Encounter (Signed)
Yes it is OK to use the albuterol nebulizer as needed with the other inhaler (trelegy) we started you on. We will send in a prescription for the nebulizer machine from the office.  The nebulizer is different delivery from the albuterol inhaler in cannister that she was on before. I think she may do better on this. Please let us know how she feels. Thanks  Triage- Please send I order for nebulizer machine.  Marshell Garfinkel MD [facility] 04/06/2017, 10:25 AM

## 2017-04-07 LAB — CULTURE, BLOOD (ROUTINE X 2)
CULTURE: NO GROWTH
Culture: NO GROWTH
SPECIAL REQUESTS: ADEQUATE
SPECIAL REQUESTS: ADEQUATE

## 2017-04-10 DIAGNOSIS — J9601 Acute respiratory failure with hypoxia: Secondary | ICD-10-CM | POA: Diagnosis not present

## 2017-04-10 DIAGNOSIS — J441 Chronic obstructive pulmonary disease with (acute) exacerbation: Secondary | ICD-10-CM | POA: Diagnosis not present

## 2017-04-11 DIAGNOSIS — J9601 Acute respiratory failure with hypoxia: Secondary | ICD-10-CM | POA: Diagnosis not present

## 2017-04-11 DIAGNOSIS — J441 Chronic obstructive pulmonary disease with (acute) exacerbation: Secondary | ICD-10-CM | POA: Diagnosis not present

## 2017-04-12 DIAGNOSIS — J9601 Acute respiratory failure with hypoxia: Secondary | ICD-10-CM | POA: Diagnosis not present

## 2017-04-12 DIAGNOSIS — J441 Chronic obstructive pulmonary disease with (acute) exacerbation: Secondary | ICD-10-CM | POA: Diagnosis not present

## 2017-04-12 NOTE — Discharge Summary (Signed)
Physician Discharge Summary  [name] VOZ:[id number] DOB: 12-11-1930 DOA: 04/02/2017  PCP: Lajean Manes, MD  Admit date: 04/02/2017 Discharge date: 04/04/2017  Admitted From: Home.  Disposition:  Home.   Recommendations for Outpatient Follow-up:  1. Follow up with PCP in 1-2 weeks 2. Please obtain BMP/CBC in one week   Home Health:yes   Discharge Condition: stable CODE STATUS: DNR Diet recommendation: Heart Healthy  Brief/Interim Summary: Debbie Gunnoe Winslowis a 83 y.o.femalewith medical history significant of sick sinus syndrome, complete heart block, status post pacemaker placement, diastolic CHF, paroxysmal atrial fibrillation, COPD, dermatomyositis,/polymyalgia rheumatica, scoliosis,dementia, hypertension, who is brought to the emergency department from rehabilitation facilitydue to hypoxia of 84%. SHE was found to have health care associated pneumonia.   Discharge Diagnoses:  Principal Problem:   HCAP (healthcare-associated pneumonia) Active Problems:   Paroxysmal atrial fibrillation (HCC)   Acute on chronic diastolic CHF (congestive heart failure), NYHA class 1 (HCC)   COPD (chronic obstructive pulmonary disease) (HCC)   Benign essential HTN   Polymyalgia rheumatica ([place])   Dementia  Health care associated pneumonia:  Admit for IV antibiotics.  Blood cultures neg so far.  TRANSITION to oral antibiotics on discharge.    Acute on chronic diastolic heart failure.  Resume lasix.  [place] oxygen to keep sats greater than 90%.   PAF: Resume anti coagulation.    COPD: No wheezing heard.  Resume duonebs as needed.    Hypertension:  Well controlled.    PMR:  STABLE.    Mild dementia:  Stable.    Discharge Instructions  Discharge Instructions    Diet - low sodium heart healthy    Complete by:  As directed    Discharge instructions    Complete by:  As directed    [place] PCP IN 1 TO 2 WEEKS BEFORE THE ANTIBIOTICS ARE  COMPLETED.    Please get a CXR in 4 weeks to evaluate for resolution of pneumonia.     Allergies as of 04/04/2017   No Known Allergies     Medication List    STOP taking these medications   benzocaine 10 % mucosal gel Commonly known as:  ORAJEL   doxycycline 100 MG tablet Commonly known as:  VIBRA-TABS   SYMBICORT 160-4.5 MCG/ACT inhaler Generic drug:  budesonide-formoterol     TAKE these medications   acetaminophen 325 MG tablet Commonly known as:  TYLENOL Take 650 mg by mouth every 6 (six) hours as needed for mild pain or moderate pain.   acetaminophen 650 MG suppository Commonly known as:  TYLENOL Place 650 mg rectally every 6 (six) hours as needed for moderate pain.   albuterol (2.5 MG/3ML) 0.083% nebulizer solution Commonly known as:  PROVENTIL Take 3 mLs (2.5 mg total) by nebulization every 6 (six) hours as needed for wheezing or shortness of breath.   alendronate 70 MG tablet Commonly known as:  FOSAMAX Take 70 mg by mouth every Saturday. Take with a full glass of water on an empty stomach.   ALPRAZolam 0.25 MG tablet Commonly known as:  XANAX Take 0.25 mg by mouth at bedtime.   apixaban 2.5 MG Tabs tablet Commonly known as:  ELIQUIS Take 1 tablet (2.5 mg total) by mouth 2 (two) times daily. What changed:  Another medication with the same name was removed. Continue taking this medication, and follow the directions you see here.   docusate sodium 100 MG capsule Commonly known as:  COLACE Take 200 mg by mouth daily.  escitalopram 10 MG tablet Commonly known as:  LEXAPRO Take 10 mg by mouth daily.   Fluticasone-Umeclidin-Vilant 100-62.5-25 MCG/INH Aepb Commonly known as:  TRELEGY ELLIPTA Inhale 1 puff into the lungs daily.   furosemide 40 MG tablet Commonly known as:  LASIX Take 1 tablet (40 mg total) by mouth daily as needed (for weight gain of greater than 2lbs).   HYDROcodone-acetaminophen 5-325 MG tablet Commonly known as:   NORCO/VICODIN Take 1-2 tablets by mouth every 4 (four) hours as needed for moderate pain.   Ipratropium-Albuterol 20-100 MCG/ACT Aers respimat Commonly known as:  COMBIVENT RESPIMAT Inhale 1 puff into the lungs every 6 (six) hours as needed for wheezing or shortness of breath.   metoprolol succinate 25 MG 24 hr tablet Commonly known as:  TOPROL-XL Take 25 mg by mouth daily.   midodrine 2.5 MG tablet Commonly known as:  PROAMATINE Take 2.5 mg by mouth as needed (when systolic BP is lower than 100).   mirtazapine 15 MG tablet Commonly known as:  REMERON Take 7.5 mg by mouth at bedtime.   ondansetron 4 MG tablet Commonly known as:  ZOFRAN Take 4 mg by mouth every 6 (six) hours as needed for nausea or vomiting.   potassium chloride SA 20 MEQ tablet Commonly known as:  K-DUR,KLOR-CON Take 20 mEq by mouth daily as needed (when taking Lasix).   predniSONE 10 MG tablet Commonly known as:  DELTASONE Take 1 tablet (10 mg total) by mouth daily with breakfast. What changed:  how much to take  how to take this  when to take this  additional instructions     ASK your doctor about these medications   levofloxacin 500 MG tablet Commonly known as:  LEVAQUIN Take 1 tablet (500 mg total) by mouth daily. Ask about: Should I take this medication?      Follow-up Information    Care, [facility] Follow up.   Specialty:  [place] Why:  [facility] physical therapy Contact information: [place] [address] [phone]        Lajean Manes, MD. Schedule an appointment as soon as possible for a visit in 1 week(s).   Specialty:  Internal Medicine Why:  recheck CXR in 4 weeks.  Contact information: [address] [place] [address] [phone]          No Known Allergies  Consultations:  none   Procedures/Studies: Dg Chest 2 View  Result Date: 04/02/2017 CLINICAL DATA:  Short of breath, congestive heart  failure EXAM: CHEST  2 VIEW COMPARISON:  Radiograph 03/18/2017 FINDINGS: RIGHT-sided pacemaker. Normal cardiac silhouette. Lungs are hyperinflated. Two nodular densities in the LEFT lung are new from prior. Lungs are hyperinflated. Small effusion on the LEFT. Chronic compression deformities of the midthoracic spine. IMPRESSION: Two nodular densities in the LEFT lung are new. Small effusion on the LEFT. Findings suggest lingular pneumonia as lesions are not seen on please comparison exam. Followup PA and lateral chest X-ray is recommended in 3-4 weeks following trial of antibiotic therapy to ensure resolution and exclude underlying malignancy. Electronically Signed   By: Suzy Bouchard M.D.   On: 04/02/2017 18:51   Dg Chest 2 View  Result Date: 03/18/2017 CLINICAL DATA:  Shortness of breath EXAM: CHEST  2 VIEW COMPARISON:  01/10/2017 FINDINGS: Right chest wall pacer device is noted with lead in the right atrial appendage and right ventricle. Mild cardiac enlargement. Small right pleural effusion is identified. Diffuse osteopenia. Compression  deformities within the midthoracic spine are identified. When compared with the exam from 01/10/2017 the fracture at T8 is new. IMPRESSION: 1. No acute cardiopulmonary abnormalities. 2. New T8 compression fracture. Electronically Signed   By: Kerby Moors M.D.   On: 03/18/2017 20:18       Subjective: No new complaints.   Discharge Exam: Vitals:   04/04/17 1058 04/04/17 1518  BP: (!) 85/56 126/66  Pulse: 77 78  Resp:  18  Temp:  98.9 F (37.2 C)  SpO2:  95%   Vitals:   04/04/17 0624 04/04/17 0925 04/04/17 1058 04/04/17 1518  BP: (!) 144/58  (!) 85/56 126/66  Pulse: 82 87 77 78  Resp: 18 16  18   Temp: 98.8 F (37.1 C)   98.9 F (37.2 C)  TempSrc: Oral   Oral  SpO2: 96% 96%  95%  Weight:      Height:        General: Pt is alert, awake, not in acute distress Cardiovascular: RRR, S1/S2 +, no rubs, no gallops Respiratory: CTA bilaterally, no  wheezing, no rhonchi Abdominal: Soft, NT, ND, bowel sounds + Extremities: no edema, no cyanosis    The results of significant diagnostics from this hospitalization (including imaging, microbiology, ancillary and laboratory) are listed below for reference.     Microbiology: Recent Results (from the past 240 hour(s))  Blood culture (routine x 2)     Status: None   Collection Time: 04/02/17  6:57 PM  Result Value Ref Range Status   Specimen Description BLOOD LEFT ANTECUBITAL  Final   Special Requests   Final    BOTTLES DRAWN AEROBIC AND ANAEROBIC Blood Culture adequate volume   Culture   Final    NO GROWTH 5 DAYS Performed at [facility], [address] [place], [place] [address]    Report Status 04/07/2017 FINAL  Final  Blood culture (routine x 2)     Status: None   Collection Time: 04/02/17  7:57 PM  Result Value Ref Range Status   Specimen Description BLOOD RIGHT WRIST  Final   Special Requests   Final    BOTTLES DRAWN AEROBIC AND ANAEROBIC Blood Culture adequate volume   Culture   Final    NO GROWTH 5 DAYS Performed at [facility], [place] [address], [place], [place] [address]    Report Status 04/07/2017 FINAL  Final     Labs: BNP (last 3 results)  Recent Labs  10/08/16 1741 03/18/17 2142 04/02/17 1859  BNP 675.1* 706.7* 329.9*   Basic Metabolic Panel: No results for input(s): NA, K, CL, CO2, GLUCOSE, BUN, CREATININE, CALCIUM, MG, PHOS in the last 168 hours. Liver Function Tests: No results for input(s): AST, ALT, ALKPHOS, BILITOT, PROT, ALBUMIN in the last 168 hours. No results for input(s): LIPASE, AMYLASE in the last 168 hours. No results for input(s): AMMONIA in the last 168 hours. CBC: No results for input(s): WBC, NEUTROABS, HGB, HCT, MCV, PLT in the last 168 hours. Cardiac Enzymes: No results for input(s): CKTOTAL, CKMB, CKMBINDEX, TROPONINI in the last 168 hours. BNP: Invalid input(s): POCBNP CBG: No results for input(s): GLUCAP in the  last 168 hours. D-Dimer No results for input(s): DDIMER in the last 72 hours. Hgb A1c No results for input(s): HGBA1C in the last 72 hours. Lipid Profile No results for input(s): CHOL, HDL, LDLCALC, TRIG, CHOLHDL, LDLDIRECT in the last 72 hours. Thyroid function studies No results for input(s): TSH, T4TOTAL, T3FREE, THYROIDAB in the last 72  hours.  Invalid input(s): FREET3 Anemia work up No results for input(s): VITAMINB12, FOLATE, FERRITIN, TIBC, IRON, RETICCTPCT in the last 72 hours. Urinalysis    Component Value Date/Time   COLORURINE YELLOW 10/11/2016 0033   APPEARANCEUR CLEAR 10/11/2016 0033   LABSPEC 1.016 10/11/2016 0033   PHURINE 6.0 10/11/2016 0033   GLUCOSEU NEGATIVE 10/11/2016 0033   HGBUR SMALL (A) 10/11/2016 0033   BILIRUBINUR NEGATIVE 10/11/2016 0033   KETONESUR NEGATIVE 10/11/2016 0033   PROTEINUR NEGATIVE 10/11/2016 0033   UROBILINOGEN 1.0 01/19/2011 1030   NITRITE NEGATIVE 10/11/2016 0033   LEUKOCYTESUR NEGATIVE 10/11/2016 0033   Sepsis Labs Invalid input(s): PROCALCITONIN,  WBC,  LACTICIDVEN Microbiology Recent Results (from the past 240 hour(s))  Blood culture (routine x 2)     Status: None   Collection Time: 04/02/17  6:57 PM  Result Value Ref Range Status   Specimen Description BLOOD LEFT ANTECUBITAL  Final   Special Requests   Final    BOTTLES DRAWN AEROBIC AND ANAEROBIC Blood Culture adequate volume   Culture   Final    NO GROWTH 5 DAYS Performed at [facility], [address] [place], [place] [address]    Report Status 04/07/2017 FINAL  Final  Blood culture (routine x 2)     Status: None   Collection Time: 04/02/17  7:57 PM  Result Value Ref Range Status   Specimen Description BLOOD RIGHT WRIST  Final   Special Requests   Final    BOTTLES DRAWN AEROBIC AND ANAEROBIC Blood Culture adequate volume   Culture   Final    NO GROWTH 5 DAYS Performed at [facility], [place] [address] [place], [place] [address]    Report Status  04/07/2017 FINAL  Final     Time coordinating discharge: Over 30 minutes  SIGNED:   Hosie Poisson, MD  [facility] 04/12/2017, 8:09 AM Pager   If 7PM-7AM, please contact night-coverage [email or web address] Password [email or web address]

## 2017-04-13 ENCOUNTER — Telehealth: Payer: Self-pay | Admitting: Pulmonary Disease

## 2017-04-13 ENCOUNTER — Other Ambulatory Visit: Payer: Self-pay

## 2017-04-13 DIAGNOSIS — J439 Emphysema, unspecified: Secondary | ICD-10-CM

## 2017-04-13 DIAGNOSIS — J441 Chronic obstructive pulmonary disease with (acute) exacerbation: Secondary | ICD-10-CM | POA: Diagnosis not present

## 2017-04-13 DIAGNOSIS — L989 Disorder of the skin and subcutaneous tissue, unspecified: Secondary | ICD-10-CM | POA: Diagnosis not present

## 2017-04-13 DIAGNOSIS — J438 Other emphysema: Secondary | ICD-10-CM

## 2017-04-13 DIAGNOSIS — J449 Chronic obstructive pulmonary disease, unspecified: Secondary | ICD-10-CM | POA: Diagnosis not present

## 2017-04-13 DIAGNOSIS — I951 Orthostatic hypotension: Secondary | ICD-10-CM | POA: Diagnosis not present

## 2017-04-13 DIAGNOSIS — J181 Lobar pneumonia, unspecified organism: Secondary | ICD-10-CM | POA: Diagnosis not present

## 2017-04-13 DIAGNOSIS — Z79899 Other long term (current) drug therapy: Secondary | ICD-10-CM | POA: Diagnosis not present

## 2017-04-13 DIAGNOSIS — J9601 Acute respiratory failure with hypoxia: Secondary | ICD-10-CM | POA: Diagnosis not present

## 2017-04-13 MED ORDER — FLUTICASONE-UMECLIDIN-VILANT 100-62.5-25 MCG/INH IN AEPB: 1.0000 | INHALATION_SPRAY | Freq: Every day | RESPIRATORY_TRACT | 3 refills | Status: DC

## 2017-04-13 NOTE — Telephone Encounter (Signed)
Spoke with pt's daughter and advised I would send RX to pharmacy. She states the patient is in the doughnut hole and Trelegy is over 1200 dollars. I gave her 2 samples which equals one month and advised her they are ready to pick up. Either way she is going to pay full price her inhalers until the end of the year. Advised to call 1 week before she runs out to see if we could help her with samples until then. Nothing further is needed.

## 2017-04-17 DIAGNOSIS — J441 Chronic obstructive pulmonary disease with (acute) exacerbation: Secondary | ICD-10-CM | POA: Diagnosis not present

## 2017-04-17 DIAGNOSIS — J9601 Acute respiratory failure with hypoxia: Secondary | ICD-10-CM | POA: Diagnosis not present

## 2017-04-19 DIAGNOSIS — D0462 Carcinoma in situ of skin of left upper limb, including shoulder: Secondary | ICD-10-CM | POA: Diagnosis not present

## 2017-04-19 DIAGNOSIS — Z1283 Encounter for screening for malignant neoplasm of skin: Secondary | ICD-10-CM | POA: Diagnosis not present

## 2017-04-19 DIAGNOSIS — Z08 Encounter for follow-up examination after completed treatment for malignant neoplasm: Secondary | ICD-10-CM | POA: Diagnosis not present

## 2017-04-19 DIAGNOSIS — C44622 Squamous cell carcinoma of skin of right upper limb, including shoulder: Secondary | ICD-10-CM | POA: Diagnosis not present

## 2017-04-19 DIAGNOSIS — L820 Inflamed seborrheic keratosis: Secondary | ICD-10-CM | POA: Diagnosis not present

## 2017-04-19 DIAGNOSIS — Z85820 Personal history of malignant melanoma of skin: Secondary | ICD-10-CM | POA: Diagnosis not present

## 2017-04-19 DIAGNOSIS — D225 Melanocytic nevi of trunk: Secondary | ICD-10-CM | POA: Diagnosis not present

## 2017-04-20 ENCOUNTER — Telehealth: Payer: Self-pay | Admitting: Pulmonary Disease

## 2017-04-20 DIAGNOSIS — J9601 Acute respiratory failure with hypoxia: Secondary | ICD-10-CM | POA: Diagnosis not present

## 2017-04-20 DIAGNOSIS — J441 Chronic obstructive pulmonary disease with (acute) exacerbation: Secondary | ICD-10-CM | POA: Diagnosis not present

## 2017-04-20 NOTE — Telephone Encounter (Signed)
Received a PA request for patient's Trelegy inhaler. Request has been processed on [email or web address]. Key is [facility].   Will keep this message open until a determination has been made.

## 2017-04-21 ENCOUNTER — Inpatient Hospital Stay (HOSPITAL_COMMUNITY)
Admission: EM | Admit: 2017-04-21 | Discharge: 2017-04-25 | DRG: 291 | Disposition: A | Discharge status: Home Health | Payer: Medicare Other | Attending: Internal Medicine | Admitting: Internal Medicine

## 2017-04-21 ENCOUNTER — Emergency Department (HOSPITAL_COMMUNITY): Payer: Medicare Other

## 2017-04-21 DIAGNOSIS — Z823 Family history of stroke: Secondary | ICD-10-CM

## 2017-04-21 DIAGNOSIS — I5021 Acute systolic (congestive) heart failure: Secondary | ICD-10-CM

## 2017-04-21 DIAGNOSIS — Z515 Encounter for palliative care: Secondary | ICD-10-CM

## 2017-04-21 DIAGNOSIS — Z7983 Long term (current) use of bisphosphonates: Secondary | ICD-10-CM | POA: Diagnosis not present

## 2017-04-21 DIAGNOSIS — Z950 Presence of cardiac pacemaker: Secondary | ICD-10-CM | POA: Diagnosis not present

## 2017-04-21 DIAGNOSIS — J9601 Acute respiratory failure with hypoxia: Secondary | ICD-10-CM | POA: Diagnosis not present

## 2017-04-21 DIAGNOSIS — R069 Unspecified abnormalities of breathing: Secondary | ICD-10-CM | POA: Diagnosis not present

## 2017-04-21 DIAGNOSIS — R918 Other nonspecific abnormal finding of lung field: Secondary | ICD-10-CM | POA: Diagnosis not present

## 2017-04-21 DIAGNOSIS — J90 Pleural effusion, not elsewhere classified: Secondary | ICD-10-CM | POA: Diagnosis not present

## 2017-04-21 DIAGNOSIS — I5033 Acute on chronic diastolic (congestive) heart failure: Secondary | ICD-10-CM | POA: Diagnosis present

## 2017-04-21 DIAGNOSIS — J440 Chronic obstructive pulmonary disease with acute lower respiratory infection: Secondary | ICD-10-CM | POA: Diagnosis present

## 2017-04-21 DIAGNOSIS — Z66 Do not resuscitate: Secondary | ICD-10-CM | POA: Diagnosis not present

## 2017-04-21 DIAGNOSIS — M353 Polymyalgia rheumatica: Secondary | ICD-10-CM | POA: Diagnosis present

## 2017-04-21 DIAGNOSIS — I110 Hypertensive heart disease with heart failure: Secondary | ICD-10-CM | POA: Diagnosis present

## 2017-04-21 DIAGNOSIS — Z7189 Other specified counseling: Secondary | ICD-10-CM | POA: Diagnosis not present

## 2017-04-21 DIAGNOSIS — Y95 Nosocomial condition: Secondary | ICD-10-CM | POA: Diagnosis present

## 2017-04-21 DIAGNOSIS — J810 Acute pulmonary edema: Secondary | ICD-10-CM

## 2017-04-21 DIAGNOSIS — Z7952 Long term (current) use of systemic steroids: Secondary | ICD-10-CM

## 2017-04-21 DIAGNOSIS — J189 Pneumonia, unspecified organism: Secondary | ICD-10-CM | POA: Diagnosis present

## 2017-04-21 DIAGNOSIS — R0600 Dyspnea, unspecified: Secondary | ICD-10-CM

## 2017-04-21 DIAGNOSIS — Z87891 Personal history of nicotine dependence: Secondary | ICD-10-CM | POA: Diagnosis not present

## 2017-04-21 DIAGNOSIS — T380X5A Adverse effect of glucocorticoids and synthetic analogues, initial encounter: Secondary | ICD-10-CM | POA: Diagnosis present

## 2017-04-21 DIAGNOSIS — Z7901 Long term (current) use of anticoagulants: Secondary | ICD-10-CM | POA: Diagnosis not present

## 2017-04-21 DIAGNOSIS — Z79899 Other long term (current) drug therapy: Secondary | ICD-10-CM | POA: Diagnosis not present

## 2017-04-21 DIAGNOSIS — F0390 Unspecified dementia without behavioral disturbance: Secondary | ICD-10-CM | POA: Diagnosis present

## 2017-04-21 DIAGNOSIS — I480 Paroxysmal atrial fibrillation: Secondary | ICD-10-CM | POA: Diagnosis present

## 2017-04-21 DIAGNOSIS — J441 Chronic obstructive pulmonary disease with (acute) exacerbation: Secondary | ICD-10-CM | POA: Diagnosis present

## 2017-04-21 DIAGNOSIS — R0602 Shortness of breath: Secondary | ICD-10-CM | POA: Diagnosis not present

## 2017-04-21 LAB — I-STAT CG4 LACTIC ACID, ED: Lactic Acid, Venous: 1.56 mmol/L (ref 0.5–1.9)

## 2017-04-21 LAB — COMPREHENSIVE METABOLIC PANEL
ALBUMIN: 3.3 g/dL — AB (ref 3.5–5.0)
ALT: 16 U/L (ref 14–54)
AST: 25 U/L (ref 15–41)
Alkaline Phosphatase: 72 U/L (ref 38–126)
Anion gap: 8 (ref 5–15)
BUN: 20 mg/dL (ref 6–20)
CHLORIDE: 106 mmol/L (ref 101–111)
CO2: 26 mmol/L (ref 22–32)
Calcium: 8.8 mg/dL — ABNORMAL LOW (ref 8.9–10.3)
Creatinine, Ser: 0.87 mg/dL (ref 0.44–1.00)
GFR calc Af Amer: >60 mL/min (ref >60)
GFR calc non Af Amer: 59 mL/min — ABNORMAL LOW (ref >60)
GLUCOSE: 110 mg/dL — AB (ref 65–99)
POTASSIUM: 4 mmol/L (ref 3.5–5.1)
SODIUM: 140 mmol/L (ref 135–145)
Total Bilirubin: 0.8 mg/dL (ref 0.3–1.2)
Total Protein: 6.6 g/dL (ref 6.5–8.1)

## 2017-04-21 LAB — CBC WITH DIFFERENTIAL/PLATELET
BASOS ABS: 0 10*3/uL (ref 0.0–0.1)
BASOS PCT: 0 %
EOS PCT: 0 %
Eosinophils Absolute: 0.1 10*3/uL (ref 0.0–0.7)
HCT: 39.1 % (ref 36.0–46.0)
Hemoglobin: 12.1 g/dL (ref 12.0–15.0)
Lymphocytes Relative: 17 %
Lymphs Abs: 3.2 10*3/uL (ref 0.7–4.0)
MCH: 28.3 pg (ref 26.0–34.0)
MCHC: 30.9 g/dL (ref 30.0–36.0)
MCV: 91.4 fL (ref 78.0–100.0)
MONO ABS: 1.5 10*3/uL — AB (ref 0.1–1.0)
Monocytes Relative: 8 %
NEUTROS ABS: 14.2 10*3/uL — AB (ref 1.7–7.7)
Neutrophils Relative %: 75 %
PLATELETS: 435 10*3/uL — AB (ref 150–400)
RBC: 4.28 MIL/uL (ref 3.87–5.11)
RDW: 15 % (ref 11.5–15.5)
WBC: 19 10*3/uL — ABNORMAL HIGH (ref 4.0–10.5)

## 2017-04-21 MED ORDER — SODIUM CHLORIDE 0.9 % IV BOLUS (SEPSIS): 1000.0000 mL | Freq: Once | INTRAVENOUS | Status: DC

## 2017-04-21 MED ORDER — NITROGLYCERIN IN D5W 200-5 MCG/ML-% IV SOLN
0.0000 ug/min | INTRAVENOUS | Status: DC
  Administered 2017-04-22: 16.667 ug/min via INTRAVENOUS
  Filled 2017-04-21: qty 250

## 2017-04-21 MED ORDER — PIPERACILLIN-TAZOBACTAM 3.375 G IVPB 30 MIN
3.3750 g | Freq: Once | INTRAVENOUS | Status: AC
  Administered 2017-04-21: 3.375 g via INTRAVENOUS
  Filled 2017-04-21: qty 50

## 2017-04-21 MED ORDER — VANCOMYCIN HCL IN DEXTROSE 1-5 GM/200ML-% IV SOLN
1000.0000 mg | Freq: Once | INTRAVENOUS | Status: AC
  Administered 2017-04-21: 1000 mg via INTRAVENOUS
  Filled 2017-04-21: qty 200

## 2017-04-21 MED ORDER — LORAZEPAM 2 MG/ML IJ SOLN
0.5000 mg | Freq: Once | INTRAMUSCULAR | Status: AC
  Administered 2017-04-21: 0.5 mg via INTRAVENOUS
  Filled 2017-04-21: qty 1

## 2017-04-21 MED ORDER — FUROSEMIDE 10 MG/ML IJ SOLN
40.0000 mg | Freq: Once | INTRAMUSCULAR | Status: AC
  Administered 2017-04-21: 40 mg via INTRAVENOUS
  Filled 2017-04-21: qty 4

## 2017-04-21 MED ORDER — NITROGLYCERIN 0.4 MG SL SUBL
0.4000 mg | SUBLINGUAL_TABLET | SUBLINGUAL | Status: DC | PRN
  Administered 2017-04-21 (×2): 0.4 mg via SUBLINGUAL
  Filled 2017-04-21: qty 1

## 2017-04-21 MED ORDER — ONDANSETRON HCL 4 MG/2ML IJ SOLN
4.0000 mg | Freq: Once | INTRAMUSCULAR | Status: AC
  Administered 2017-04-21: 4 mg via INTRAVENOUS
  Filled 2017-04-21: qty 2

## 2017-04-21 MED ORDER — NITROGLYCERIN 2 % TD OINT: 1.0000 [in_us] | TOPICAL_OINTMENT | Freq: Once | TRANSDERMAL | Status: DC

## 2017-04-21 NOTE — Progress Notes (Signed)
A consult was received from an ED physician for Vancomycin & Zosyn per pharmacy dosing.  The patient's profile has been reviewed for ht/wt/allergies/indication/available labs.   A one time order has been placed for Vancomycin 1gm, Zosyn 3.375gm.  Further antibiotics/pharmacy consults should be ordered by admitting physician if indicated.                       Thank you, Minda Ditto 04/21/2017  10:23 PM

## 2017-04-21 NOTE — ED Provider Notes (Signed)
[facility] DEPT Provider Note   CSN: [id number] Arrival date & time: 04/21/17  2138     History   Chief Complaint Chief Complaint  Patient presents with  . Shortness of Breath    HPI [name] is a 83 y.o. female.  HPI   83 year old female with past medical history as below including multiple admissions for healthcare associated pneumonia this month who presents with acute onset of severe shortness of breath.  The patient is markedly dyspneic and unable to provide history at this time.  The patient has an extensive history as below.  Per report from the patient's daughter, the patient has had increasing shortness of breath for the last 24 hours.  It has acutely worsened over the last 1-2 hours.  She has begun coughing and bringing up frothy yellow sputum.  No known fevers.  Patient just finished a course of antibiotics.  Level 5 caveat invoked as remainder of history, ROS, and physical exam limited due to patient's SOB, respiratory illness.   Past Medical History:  Diagnosis Date  . CHB (complete heart block) ([place]) 07/13/2013   Pacemaker dependent  . CHF (congestive heart failure) ([place])   . COPD (chronic obstructive pulmonary disease) ([place])   . DM (dermatomyositis)   . Orthostatic hypotension   . Pacemaker 07/13/2013   Her original pacemaker and the current leads were implanted in 1992. She has had 2 generator change out, most recently in 2010. Her device is a Buyer, retail 2110 non RF dual-chamber pacemaker with a battery longevity estimated at about 7 years. The atrial lead is a St. Jude [id number] and the ventricular lead was a Biotronik [id number].   [name] Paroxysmal atrial fibrillation ([place]) 09/04/2014   on Eliquis  . Polymyalgia rheumatica ([place])   . Scoliosis   . SSS (sick sinus syndrome) ([place]) 07/13/2013  . Syncope   . Systemic hypertension   . Thoracic kyphosis     Patient Active Problem List   Diagnosis Date Noted  . Stress fracture of  thoracic vertebra 03/19/2017  . Dementia 03/19/2017  . Non-traumatic compression fracture of T9 thoracic vertebra ([place]) 01/02/2017  . Polymyalgia rheumatica ([place])   . Postural dizziness with near syncope   . Syncope 10/11/2016  . Postural dizziness with presyncope 10/11/2016  . Acute hypoxemic respiratory failure ([place]) 10/05/2016  . Chronic diastolic CHF (congestive heart failure) ([place]) 10/05/2016  . Smoker 10/05/2016  . Closed multiple fractures of right upper extremity with ribs with routine healing 10/05/2016  . HCAP (healthcare-associated pneumonia) 10/31/2015  . COPD (chronic obstructive pulmonary disease) ([place]) 10/31/2015  . Leukocytosis 10/31/2015  . Benign essential HTN 10/31/2015  . Anxiety state   . COPD exacerbation ([place]) 08/26/2015  . Systemic hypertension 08/26/2015  . Hypokalemia 08/26/2015  . Anticoagulant long-term use 08/26/2015  . DM (dermatomyositis) 08/26/2015  . Acute on chronic diastolic CHF (congestive heart failure), NYHA class 1 ([place])   . Sepsis ([place]) 06/07/2015  . CAP (community acquired pneumonia) 06/07/2015  . Acute CHF (congestive heart failure) ([place]) 06/07/2015  . Paroxysmal atrial fibrillation ([place]) 09/04/2014  . Pacemaker 07/13/2013  . CHB (complete heart block) ([place]) 07/13/2013  . SSS (sick sinus syndrome) ([place]) 07/13/2013  . Orthostatic hypotension 07/13/2013  . Aortic insufficiency 07/13/2013    Past Surgical History:  Procedure Laterality Date  . IR KYPHO THORACIC WITH BONE BIOPSY  01/04/2017  . IR RADIOLOGIST EVAL & MGMT  01/10/2017  . NM MYOCAR PERF WALL MOTION  09/13/2011  Low risk  . PERMANENT PACEMAKER GENERATOR CHANGE  03/27/2009   St.Jude  . US ECHOCARDIOGRAPHY  03/28/2012   Mod LAE,mild MR,aortic sclerosis w/mod AI,mod. TR,mild PI,Stage I diastolic dysfunction  . VIDEO BRONCHOSCOPY Bilateral 10/07/2016   Procedure: VIDEO BRONCHOSCOPY WITH FLUORO;  Surgeon: Marshell Garfinkel, MD;  Location: [facility];  Service: Cardiopulmonary;   Laterality: Bilateral;    OB History    No data available       Home Medications    Prior to Admission medications   Medication Sig Start Date End Date Taking? Authorizing Provider  acetaminophen (TYLENOL) 325 MG tablet Take 325 mg by mouth every 6 (six) hours as needed for mild pain, moderate pain or fever.    Yes [provider]  acetaminophen (TYLENOL) 650 MG suppository Place 650 mg rectally every 6 (six) hours as needed for moderate pain.   Yes [provider]  alendronate (FOSAMAX) 70 MG tablet Take 70 mg by mouth every Saturday. Take with a full glass of water on an empty stomach.   Yes [provider]  ALPRAZolam (XANAX) 0.25 MG tablet Take 0.25 mg by mouth at bedtime.    Yes [provider]  apixaban (ELIQUIS) 2.5 MG TABS tablet Take 1 tablet (2.5 mg total) by mouth 2 (two) times daily. 03/29/17  Yes Croitoru, Mihai, MD  docusate sodium (COLACE) 100 MG capsule Take 100 mg by mouth daily.    Yes [provider]  escitalopram (LEXAPRO) 10 MG tablet Take 10 mg by mouth daily.   Yes [provider]  furosemide (LASIX) 40 MG tablet Take 1 tablet (40 mg total) by mouth daily as needed (for weight gain of greater than 2lbs). 02/21/17  Yes Croitoru, Mihai, MD  HYDROcodone-acetaminophen (NORCO/VICODIN) 5-325 MG tablet Take 1-2 tablets by mouth every 4 (four) hours as needed for moderate pain. Patient taking differently: Take 1 tablet by mouth every 4 (four) hours as needed for moderate pain.  01/05/17  Yes Dessa Phi Chahn-Yang, DO  Ipratropium-Albuterol (COMBIVENT RESPIMAT) 20-100 MCG/ACT AERS respimat Inhale 1 puff into the lungs every 6 (six) hours as needed for wheezing or shortness of breath. 08/29/15  Yes Barton Dubois, MD  metoprolol succinate (TOPROL-XL) 25 MG 24 hr tablet Take 25 mg by mouth daily.    Yes [provider]  midodrine (PROAMATINE) 2.5 MG tablet Take 2.5 mg by mouth as needed (when systolic BP is lower than  100).    Yes [provider]  mirtazapine (REMERON) 15 MG tablet Take 7.5 mg by mouth at bedtime.    Yes [provider]  ondansetron (ZOFRAN) 4 MG tablet Take 4 mg by mouth every 6 (six) hours as needed for nausea or vomiting.   Yes [provider]  predniSONE (DELTASONE) 10 MG tablet Take 1 tablet (10 mg total) by mouth daily with breakfast. 04/05/17  Yes Hosie Poisson, MD  albuterol (PROVENTIL) (2.5 MG/3ML) 0.083% nebulizer solution Take 3 mLs (2.5 mg total) by nebulization every 6 (six) hours as needed for wheezing or shortness of breath. Patient not taking: Reported on 04/21/2017 04/04/17   Hosie Poisson, MD  Fluticasone-Umeclidin-Vilant (TRELEGY ELLIPTA) 100-62.5-25 MCG/INH AEPB Inhale 1 puff into the lungs daily. Patient not taking: Reported on 04/21/2017 04/13/17   Marshell Garfinkel, MD  potassium chloride SA (K-DUR,KLOR-CON) 20 MEQ tablet Take 20 mEq by mouth daily as needed (when taking Lasix).     [provider]    Family History Family History  Problem Relation Age of Onset  .  Stroke Mother     Social History Social History  Substance Use Topics  . Smoking status: Former Smoker    Packs/day: 0.50    Years: 60.00    Types: Cigarettes  . Smokeless tobacco: Never Used     Comment: quit 12/2016  . Alcohol use No     Allergies   Patient has no known allergies.   Review of Systems Review of Systems  Constitutional: Positive for chills and fatigue.  Respiratory: Positive for cough and shortness of breath.   Cardiovascular: Positive for leg swelling.  All other systems reviewed and are negative.    Physical Exam Updated Vital Signs BP (!) 106/42   Pulse 73   Temp 97.8 F (36.6 C)   Resp (!) 33   SpO2 97%   Physical Exam  Constitutional: She is oriented to person, place, and time. She appears well-developed and well-nourished. She has a sickly appearance. She appears ill. She appears distressed.  HENT:  Head: Normocephalic and  atraumatic.  Eyes: Conjunctivae are normal.  Neck: Neck supple.  Cardiovascular: Regular rhythm and normal heart sounds.  Tachycardia present.  Exam reveals no friction rub.   No murmur heard. Pulmonary/Chest: Accessory muscle usage present. Tachypnea noted. She is in respiratory distress. She has decreased breath sounds. She has wheezes. She has rales in the right upper field, the right middle field, the right lower field, the left upper field, the left middle field and the left lower field.  Abdominal: She exhibits no distension.  Musculoskeletal: She exhibits edema.  Neurological: She is alert and oriented to person, place, and time. She exhibits normal muscle tone.  Skin: Skin is warm. Capillary refill takes less than 2 seconds.  Psychiatric: She has a normal mood and affect.  Nursing note and vitals reviewed.    ED Treatments / Results  Labs (all labs ordered are listed, but only abnormal results are displayed) Labs Reviewed  CBC WITH DIFFERENTIAL/PLATELET - Abnormal; Notable for the following:       Result Value   WBC 19.0 (*)    Platelets 435 (*)    Neutro Abs 14.2 (*)    Monocytes Absolute 1.5 (*)    All other components within normal limits  COMPREHENSIVE METABOLIC PANEL - Abnormal; Notable for the following:    Glucose, Bld 110 (*)    Calcium 8.8 (*)    Albumin 3.3 (*)    GFR calc non Af Amer 59 (*)    All other components within normal limits  BRAIN NATRIURETIC PEPTIDE - Abnormal; Notable for the following:    B Natriuretic Peptide 917.2 (*)    All other components within normal limits  CULTURE, BLOOD (ROUTINE X 2)  CULTURE, BLOOD (ROUTINE X 2)  I-STAT CG4 LACTIC ACID, ED  I-STAT TROPONIN, ED  I-STAT CG4 LACTIC ACID, ED    EKG  EKG Interpretation  Date/Time:  Saturday April 22 2017 00:11:44 EDT Ventricular Rate:  89 PR Interval:    QRS Duration: 156 QT Interval:  392 QTC Calculation: 477 R Axis:   -83 Text Interpretation:  Atrial-sensed  ventricular-paced rhythm No further analysis attempted due to paced rhythm No significant change since last tracing Confirmed by Duffy Bruce [phone]) on 04/22/2017 2:22:04 AM       Radiology Dg Chest Portable 1 View  Result Date: 04/21/2017 CLINICAL DATA:  Shortness of breath.  Recent diagnosis of pneumonia. EXAM: PORTABLE CHEST 1 VIEW COMPARISON:  Radiograph 04/02/2017.  Chest CTA 01/02/2017 FINDINGS: Right-sided pacemaker in place.  Cardiomegaly with progression. Atherosclerosis of the aortic arch. Diffuse pulmonary edema. Left pleural effusion and basilar opacity, progressed from prior exam. Persistent nodular opacity in the left lung base. Small right pleural effusion and atelectasis also noted. No pneumothorax. IMPRESSION: CHF with increasing cardiomegaly, pulmonary edema, and bilateral pleural effusions, left greater than right. Left basilar opacities, some of which is nodular, increased from prior exam, may be infectious or inflammatory. No nodules were seen chest CTA 01/02/2017 or radiographs 03/18/2017, making neoplasm unlikely. Electronically Signed   By: Jeb Levering M.D.   On: 04/21/2017 23:11    Procedures .Critical Care Performed by: Duffy Bruce Authorized by: Duffy Bruce   Critical care provider statement:    Critical care time (minutes):  35   Critical care time was exclusive of:  Separately billable procedures and treating other patients and teaching time   Critical care was necessary to treat or prevent imminent or life-threatening deterioration of the following conditions:  Respiratory failure, circulatory failure and cardiac failure   Critical care was time spent personally by me on the following activities:  Development of treatment plan with patient or surrogate, discussions with consultants, evaluation of patient's response to treatment, examination of patient, obtaining history from patient or surrogate, ordering and performing treatments and interventions,  ordering and review of laboratory studies, ordering and review of radiographic studies, pulse oximetry, re-evaluation of patient's condition and review of old charts   I assumed direction of critical care for this patient from another provider in my specialty: no     (including critical care time)  Medications Ordered in ED Medications  nitroGLYCERIN (NITROSTAT) SL tablet 0.4 mg (0.4 mg Sublingual Given 04/21/17 2333)  nitroGLYCERIN 50 mg in dextrose 5 % 250 mL (0.2 mg/mL) infusion (not administered)  albuterol (PROVENTIL) (2.5 MG/3ML) 0.083% nebulizer solution 2.5 mg (2.5 mg Nebulization Given 04/22/17 0133)  piperacillin-tazobactam (ZOSYN) IVPB 3.375 g (0 g Intravenous Stopped 04/22/17 0017)  vancomycin (VANCOCIN) IVPB 1000 mg/200 mL premix (0 mg Intravenous Stopped 04/22/17 0137)  ondansetron (ZOFRAN) injection 4 mg (4 mg Intravenous Given 04/21/17 2245)  LORazepam (ATIVAN) injection 0.5 mg (0.5 mg Intravenous Given 04/21/17 2245)  furosemide (LASIX) injection 40 mg (40 mg Intravenous Given 04/21/17 2345)  ondansetron (ZOFRAN) injection 4 mg (4 mg Intravenous Given 04/22/17 0015)     Initial Impression / Assessment and Plan / ED Course  I have reviewed the triage vital signs and the nursing notes.  Pertinent labs & imaging results that were available during my care of the patient were reviewed by me and considered in my medical decision making (see chart for details).     83 year old female with extensive past medical history as below here with acute onset shortness of breath.  On arrival, the patient is markedly dyspneic, tachypneic, with frothing secretions.  I confirmed with the patient and family that she is DNR.  She would otherwise like supportive care at this time.  I discussed the risks and benefits of BiPAP with patient being nauseous, and will trial BiPAP.  If this does not improve her symptoms or she continues to deteriorate, will focus on comfort care.  Otherwise, broad-spectrum  antibiotics given.  She is markedly anxious and requesting something for anxiety which given her possible focus on palliative care, will trial a low-dose of Ativan so she can tolerate BiPAP.  Patient appears mildly more comfortable on BiPAP.  Her lungs sound more clear.  I discussed this with the patient and family.  Will continue a trial  of BiPAP with Lasix.  While she is mildly hypotensive after nitroglycerin, I would rather tolerate lower pressures given her clinical appearance of flash edema.  Family understands and is in agreement.  Will also give a trial of Lasix.  Final Clinical Impressions(s) / ED Diagnoses   Final diagnoses:  Acute systolic congestive heart failure ([place])  Flash pulmonary edema ([facility])  HCAP (healthcare-associated pneumonia)    New Prescriptions New Prescriptions   No medications on file     Duffy Bruce, MD 04/22/17 [phone]

## 2017-04-21 NOTE — ED Triage Notes (Signed)
Per EMS, pt from assisted living with pneumonia dx. Finished antibiotics today and has had increasing SOB since this am. Received 2.5mg  of albuterol neb at 2030 tonight with no relief. 82% on RA and was put on nonrebreather with good response of 98% sats. Rhonchi in all fields.

## 2017-04-22 ENCOUNTER — Inpatient Hospital Stay (HOSPITAL_COMMUNITY): Payer: Medicare Other

## 2017-04-22 ENCOUNTER — Encounter (HOSPITAL_COMMUNITY): Payer: Self-pay | Admitting: *Deleted

## 2017-04-22 DIAGNOSIS — J189 Pneumonia, unspecified organism: Secondary | ICD-10-CM

## 2017-04-22 LAB — CBC
HCT: 37.2 % (ref 36.0–46.0)
HEMOGLOBIN: 11.6 g/dL — AB (ref 12.0–15.0)
MCH: 28 pg (ref 26.0–34.0)
MCHC: 31.2 g/dL (ref 30.0–36.0)
MCV: 89.9 fL (ref 78.0–100.0)
Platelets: 320 10*3/uL (ref 150–400)
RBC: 4.14 MIL/uL (ref 3.87–5.11)
RDW: 15.1 % (ref 11.5–15.5)
WBC: 22.8 10*3/uL — AB (ref 4.0–10.5)

## 2017-04-22 LAB — BRAIN NATRIURETIC PEPTIDE: B NATRIURETIC PEPTIDE 5: 917.2 pg/mL — AB (ref 0.0–100.0)

## 2017-04-22 LAB — I-STAT TROPONIN, ED: Troponin i, poc: 0.05 ng/mL (ref 0.00–0.08)

## 2017-04-22 LAB — MRSA PCR SCREENING: MRSA BY PCR: POSITIVE — AB

## 2017-04-22 LAB — I-STAT CG4 LACTIC ACID, ED: Lactic Acid, Venous: 1.82 mmol/L (ref 0.5–1.9)

## 2017-04-22 LAB — STREP PNEUMONIAE URINARY ANTIGEN: STREP PNEUMO URINARY ANTIGEN: NEGATIVE

## 2017-04-22 MED ORDER — IPRATROPIUM-ALBUTEROL 20-100 MCG/ACT IN AERS: 1.0000 | INHALATION_SPRAY | Freq: Four times a day (QID) | RESPIRATORY_TRACT | Status: DC | PRN

## 2017-04-22 MED ORDER — DEXTROSE 5 % IV SOLN
1.0000 g | Freq: Three times a day (TID) | INTRAVENOUS | Status: DC
  Filled 2017-04-22: qty 1

## 2017-04-22 MED ORDER — MUPIROCIN 2 % EX OINT
1.0000 "application " | TOPICAL_OINTMENT | Freq: Two times a day (BID) | CUTANEOUS | Status: DC
  Administered 2017-04-22 – 2017-04-25 (×7): 1 via NASAL
  Filled 2017-04-22 (×2): qty 22

## 2017-04-22 MED ORDER — CHLORHEXIDINE GLUCONATE CLOTH 2 % EX PADS
6.0000 | MEDICATED_PAD | Freq: Every day | CUTANEOUS | Status: DC
  Administered 2017-04-23 – 2017-04-25 (×3): 6 via TOPICAL

## 2017-04-22 MED ORDER — MIRTAZAPINE 15 MG PO TABS
7.5000 mg | ORAL_TABLET | Freq: Every day | ORAL | Status: DC
  Administered 2017-04-22 – 2017-04-24 (×3): 7.5 mg via ORAL
  Filled 2017-04-22 (×3): qty 1

## 2017-04-22 MED ORDER — DEXTROSE 5 % IV SOLN
1.0000 g | Freq: Two times a day (BID) | INTRAVENOUS | Status: DC
  Administered 2017-04-22 – 2017-04-24 (×4): 1 g via INTRAVENOUS
  Filled 2017-04-22 (×6): qty 1

## 2017-04-22 MED ORDER — ALBUTEROL SULFATE (2.5 MG/3ML) 0.083% IN NEBU
2.5000 mg | INHALATION_SOLUTION | Freq: Four times a day (QID) | RESPIRATORY_TRACT | Status: DC
  Administered 2017-04-22 (×4): 2.5 mg via RESPIRATORY_TRACT
  Filled 2017-04-22 (×4): qty 3

## 2017-04-22 MED ORDER — ESCITALOPRAM OXALATE 10 MG PO TABS
10.0000 mg | ORAL_TABLET | Freq: Every day | ORAL | Status: DC
  Administered 2017-04-22 – 2017-04-25 (×4): 10 mg via ORAL
  Filled 2017-04-22 (×4): qty 1

## 2017-04-22 MED ORDER — FUROSEMIDE 10 MG/ML IJ SOLN
40.0000 mg | Freq: Two times a day (BID) | INTRAMUSCULAR | Status: DC
  Administered 2017-04-22 (×2): 40 mg via INTRAVENOUS
  Filled 2017-04-22 (×2): qty 4

## 2017-04-22 MED ORDER — APIXABAN 2.5 MG PO TABS
2.5000 mg | ORAL_TABLET | Freq: Two times a day (BID) | ORAL | Status: DC
  Administered 2017-04-22 – 2017-04-25 (×7): 2.5 mg via ORAL
  Filled 2017-04-22 (×7): qty 1

## 2017-04-22 MED ORDER — SODIUM CHLORIDE 0.9% FLUSH: 3.0000 mL | INTRAVENOUS | Status: DC | PRN

## 2017-04-22 MED ORDER — DOCUSATE SODIUM 100 MG PO CAPS
100.0000 mg | ORAL_CAPSULE | Freq: Every day | ORAL | Status: DC
  Administered 2017-04-22 – 2017-04-25 (×4): 100 mg via ORAL
  Filled 2017-04-22 (×4): qty 1

## 2017-04-22 MED ORDER — LORAZEPAM 2 MG/ML IJ SOLN
0.5000 mg | Freq: Every day | INTRAMUSCULAR | Status: DC | PRN
  Administered 2017-04-22: 0.5 mg via INTRAVENOUS
  Filled 2017-04-22: qty 1

## 2017-04-22 MED ORDER — METOPROLOL SUCCINATE ER 25 MG PO TB24
25.0000 mg | ORAL_TABLET | Freq: Every day | ORAL | Status: DC
  Administered 2017-04-22 – 2017-04-25 (×4): 25 mg via ORAL
  Filled 2017-04-22 (×4): qty 1

## 2017-04-22 MED ORDER — SODIUM CHLORIDE 0.9% FLUSH
3.0000 mL | Freq: Two times a day (BID) | INTRAVENOUS | Status: DC
  Administered 2017-04-22 – 2017-04-24 (×6): 3 mL via INTRAVENOUS

## 2017-04-22 MED ORDER — ALENDRONATE SODIUM 70 MG PO TABS: 70.0000 mg | ORAL_TABLET | ORAL | Status: DC

## 2017-04-22 MED ORDER — ORAL CARE MOUTH RINSE
15.0000 mL | Freq: Two times a day (BID) | OROMUCOSAL | Status: DC
  Administered 2017-04-22 – 2017-04-24 (×4): 15 mL via OROMUCOSAL

## 2017-04-22 MED ORDER — MIDODRINE HCL 2.5 MG PO TABS
2.5000 mg | ORAL_TABLET | ORAL | Status: DC | PRN
  Filled 2017-04-22: qty 1

## 2017-04-22 MED ORDER — METHYLPREDNISOLONE SODIUM SUCC 125 MG IJ SOLR
80.0000 mg | Freq: Three times a day (TID) | INTRAMUSCULAR | Status: DC
  Administered 2017-04-22 – 2017-04-24 (×7): 80 mg via INTRAVENOUS
  Filled 2017-04-22 (×7): qty 2

## 2017-04-22 MED ORDER — SODIUM CHLORIDE 0.9 % IV SOLN: 250.0000 mL | INTRAVENOUS | Status: DC | PRN

## 2017-04-22 MED ORDER — FUROSEMIDE 40 MG PO TABS: 40.0000 mg | ORAL_TABLET | Freq: Every day | ORAL | Status: DC | PRN

## 2017-04-22 MED ORDER — IPRATROPIUM-ALBUTEROL 0.5-2.5 (3) MG/3ML IN SOLN
3.0000 mL | Freq: Four times a day (QID) | RESPIRATORY_TRACT | Status: DC | PRN
Start: 2017-04-22 — End: 2017-04-25

## 2017-04-22 MED ORDER — POTASSIUM CHLORIDE CRYS ER 20 MEQ PO TBCR: 20.0000 meq | EXTENDED_RELEASE_TABLET | Freq: Every day | ORAL | Status: DC | PRN

## 2017-04-22 MED ORDER — ONDANSETRON HCL 4 MG/2ML IJ SOLN
4.0000 mg | Freq: Once | INTRAMUSCULAR | Status: AC
  Administered 2017-04-22: 4 mg via INTRAVENOUS
  Filled 2017-04-22: qty 2

## 2017-04-22 MED ORDER — HYDROCODONE-ACETAMINOPHEN 5-325 MG PO TABS: 1.0000 | ORAL_TABLET | ORAL | Status: DC | PRN

## 2017-04-22 MED ORDER — ALPRAZOLAM 0.25 MG PO TABS: 0.2500 mg | ORAL_TABLET | Freq: Every day | ORAL | Status: DC

## 2017-04-22 MED ORDER — VANCOMYCIN HCL IN DEXTROSE 750-5 MG/150ML-% IV SOLN
750.0000 mg | INTRAVENOUS | Status: DC
  Administered 2017-04-22 – 2017-04-23 (×2): 750 mg via INTRAVENOUS
  Filled 2017-04-22 (×2): qty 150

## 2017-04-22 MED ORDER — ONDANSETRON HCL 4 MG PO TABS: 4.0000 mg | ORAL_TABLET | Freq: Four times a day (QID) | ORAL | Status: DC | PRN

## 2017-04-22 NOTE — Progress Notes (Signed)
[facility] PROGRESS NOTE  [name] YBO:[id number] DOB: [birth year] DOA: 04/21/2017  PCP: Lajean Manes, MD  Brief History/Interval Summary: 83 year old Caucasian female with a history of sick sinus syndrome, complete heart block, pacemaker placement, diastolic CHF, paroxysmal atrial fibrillation on anticoagulation, COPD dementia who lives in assisted living facility and was brought into the hospital due to worsening respiratory status.  She was thought to have a combination of COPD exacerbation as well as diastolic CHF.  She was hospitalized for further management.  Reason for Visit: Acute respiratory failure with hypoxia.  Consultants: Palliative medicine  Procedures: None  Antibiotics: Vancomycin and cefepime  Subjective/Interval History: Patient is on BiPAP.  Appears to be distracted and confused.  Unable to obtain much information from her.  Her daughter is at the bedside.  ROS: Unable to do  Objective:  Vital Signs  Vitals:   04/22/17 0833 04/22/17 0910 04/22/17 1000 04/22/17 1100  BP: (!) 115/97 (!) 149/50 (!) 138/57 (!) 146/121  Pulse: 73 70  76  Resp: (!) 22 (!) 24 (!) 28 (!) 22  Temp: 98.4 F (36.9 C)     TempSrc: Axillary     SpO2: 97% 97% 95% 94%  Weight:  62.7 kg (138 lb 3.7 oz)    Height:  5\' 5"  (1.651 m)      Intake/Output Summary (Last 24 hours) at 04/22/17 1152 Last data filed at 04/22/17 1100  Gross per 24 hour  Intake              135 ml  Output             1201 ml  Net            -1066 ml   Filed Weights   04/22/17 0303 04/22/17 0910  Weight: 63 kg (139 lb) 62.7 kg (138 lb 3.7 oz)    General appearance: alert, cooperative, distracted and no distress Head: Normocephalic, without obvious abnormality, atraumatic Resp: Coarse breath sounds bilaterally.  No wheezing is heard.  Few crackles at the bases.  Mild tachypnea.  No use of accessory muscles at this time. Cardio: regular rate and rhythm, S1, S2 normal, no murmur, click, rub  or gallop GI: soft, non-tender; bowel sounds normal; no masses,  no organomegaly Extremities: Minimal edema bilateral lower extremities Pulses: 2+ and symmetric Neurologic: Awake alert.  On BiPAP.  Moving all extremities.  No obvious focal deficits.  Lab Results:  Data Reviewed: I have personally reviewed following labs and imaging studies  CBC:  Recent Labs Lab 04/21/17 2245 04/22/17 0927  WBC 19.0* 22.8*  NEUTROABS 14.2*  --   HGB 12.1 11.6*  HCT 39.1 37.2  MCV 91.4 89.9  PLT 435* 277    Basic Metabolic Panel:  Recent Labs Lab 04/21/17 2245  NA 140  K 4.0  CL 106  CO2 26  GLUCOSE 110*  BUN 20  CREATININE 0.87  CALCIUM 8.8*    GFR: Estimated Creatinine Clearance: 43.3 mL/min (by C-G formula based on SCr of 0.87 mg/dL).  Liver Function Tests:  Recent Labs Lab 04/21/17 2245  AST 25  ALT 16  ALKPHOS 72  BILITOT 0.8  PROT 6.6  ALBUMIN 3.3*     Radiology Studies: Dg Chest Portable 1 View  Result Date: 04/21/2017 CLINICAL DATA:  Shortness of breath.  Recent diagnosis of pneumonia. EXAM: PORTABLE CHEST 1 VIEW COMPARISON:  Radiograph 04/02/2017.  Chest CTA 01/02/2017 FINDINGS: Right-sided pacemaker in place. Cardiomegaly with progression. Atherosclerosis of the aortic arch. Diffuse  pulmonary edema. Left pleural effusion and basilar opacity, progressed from prior exam. Persistent nodular opacity in the left lung base. Small right pleural effusion and atelectasis also noted. No pneumothorax. IMPRESSION: CHF with increasing cardiomegaly, pulmonary edema, and bilateral pleural effusions, left greater than right. Left basilar opacities, some of which is nodular, increased from prior exam, may be infectious or inflammatory. No nodules were seen chest CTA 01/02/2017 or radiographs 03/18/2017, making neoplasm unlikely. Electronically Signed   By: Jeb Levering M.D.   On: 04/21/2017 23:11     Medications:  Scheduled: . albuterol  2.5 mg Nebulization Q6H  .  apixaban  2.5 mg Oral BID  . docusate sodium  100 mg Oral Daily  . escitalopram  10 mg Oral Daily  . furosemide  40 mg Intravenous BID  . mouth rinse  15 mL Mouth Rinse BID  . methylPREDNISolone (SOLU-MEDROL) injection  80 mg Intravenous Q8H  . metoprolol succinate  25 mg Oral Daily  . mirtazapine  7.5 mg Oral QHS  . sodium chloride flush  3 mL Intravenous Q12H   Continuous: . sodium chloride    . ceFEPime (MAXIPIME) IV 1 g (04/22/17 0531)  . nitroGLYCERIN 16.667 mcg/min (04/22/17 0325)  . vancomycin     GXQ:JJHERD chloride, HYDROcodone-acetaminophen, ipratropium-albuterol, midodrine, nitroGLYCERIN, ondansetron, sodium chloride flush  Assessment/Plan:  Principal Problem:   HCAP (healthcare-associated pneumonia)    Acute hypoxic respiratory failure Multifactorial including COPD exacerbation, pulmonary edema or possible pneumonia.  She has required BiPAP.  Could try to wean her off of it today.  Continue to monitor in stepdown/ICU setting.  Acute diastolic congestive heart failure Chest x-ray does show evidence for pulmonary edema.  She also has bilateral pleural effusions.  She will be given intravenous Lasix.  Strict ins and outs daily weights.  Possible acute COPD exacerbation No wheezing is heard currently.  Patient does have a history of COPD and has smoked extensively.  Continue steroids and nebulizer treatments.  Healthcare associated pneumonia Chest x-ray showed opacities which were concerning for an infectious process.  Patient recently completed a course of antibiotics.  She was recently hospitalized for similar presentation.  Has been empirically started on vancomycin and cefepime which can be continued for now.  There could be an element of aspiration as well.  We will have speech therapy evaluate her.  Paroxysmal atrial fibrillation Continue Eliquis.  Continue beta-blocker.  Monitor on telemetry.  History of polymyalgia rheumatica She is on prednisone at home which  is being held as she is on Solu-Medrol in the hospital.  History of sick sinus syndrome/heart block/pacemaker Stable.  Continue to monitor.  EKG reviewed  Goals of care Discussed in detail with patient's daughter who was at the bedside.  The patient has had multiple hospitalizations this year especially in the last 2-3 months.  All of these have been for respiratory issues.  She feels that her mother is slowly declining.  She is agreeable to DNR status.  She does have healthcare power of attorney.  She is the only surviving child.  She is very interested in talking to palliative medicine for goals of care.  Agreeable to continuing current treatments.  If patient does not improve or declines then she is amenable to talking about comfort care and hospice.  We will consult palliative medicine to evaluate patient.  DVT Prophylaxis: Patient is on Eliquis    Code Status: DNR Family Communication: Discussed with the patient's daughter Disposition Plan: Management as outlined above.   LOS: 1  day   [facility] Pager [phone] 04/22/2017, 11:52 AM  If 7PM-7AM, please contact night-coverage at [email or web address], password [facility]

## 2017-04-22 NOTE — H&P (Addendum)
TRH H&P   Patient Demographics:    [name], is a 83 y.o. female  MRN: [id number]   DOB - 1930/09/06  Admit Date - 04/21/2017  Outpatient Primary MD for the patient is Lajean Manes, MD  Referring MD/NP/PA: Lindell Noe  Outpatient Specialists:   Patient coming from:   Home  Chief Complaint  Patient presents with  . Shortness of Breath      HPI:    [name]  is a 83 y.o. female, w sick sinus syndrome, complete hb, s/p pacer, diastolic CHF, Pafib, Copd, dermatomyostitis/PMR, scoliosis, dementia who apparently developed sob this past evening.  Pt notes cough with yellow sputum. Denies fever, chills, cp, palp, orthopnea, pnd, lower ext edema.    In ED, pt was placed on Bipap for hypoxic respiratory failure.  Wbc 19.0, Hgb 12.1, Plt 435 Bun 20, Creatinine 0.87  CXR  CHF with increasing cardiomegaly, pulmonary edema, and bilateral pleural effusions, left greater than right. Left basilar opacities, some of which is nodular, increased from prior exam, may be infectious or inflammatory. No nodules were seen chest CTA  Pt will be admitted for hcap, left pleural effusion, and acute hypoxic respiratory failure.     Review of systems:    In addition to the HPI above,  No Fever-chills, No Headache, No changes with Vision or hearing, No problems swallowing food or Liquids, No Chest pain, No Abdominal pain, No Nausea or Vommitting, Bowel movements are regular, No Blood in stool or Urine, No dysuria, No new skin rashes or bruises, No new joints pains-aches,  No new weakness, tingling, numbness in any extremity, No recent weight gain or loss, No polyuria, polydypsia or polyphagia, No significant Mental Stressors.  A full 10 point Review of Systems was done, except as stated above, all other Review of Systems were negative.   With Past History of the following  :    Past Medical History:  Diagnosis Date  . CHB (complete heart block) ([place]) 07/13/2013   Pacemaker dependent  . CHF (congestive heart failure) ([employer])   . COPD (chronic obstructive pulmonary disease) ([place])   . DM (dermatomyositis)   . Orthostatic hypotension   . Pacemaker 07/13/2013   Her original pacemaker and the current leads were implanted in 1992. She has had 2 generator change out, most recently in 2010. Her device is a Buyer, retail 2110 non RF dual-chamber pacemaker with a battery longevity estimated at about 7 years. The atrial lead is a St. Jude [id number] and the ventricular lead was a Biotronik [id number].   [name] Paroxysmal atrial fibrillation ([place]) 09/04/2014   on Eliquis  . Polymyalgia rheumatica ([place])   . Scoliosis   . SSS (sick sinus syndrome) ([place]) 07/13/2013  . Syncope   . Systemic hypertension   . Thoracic kyphosis       Past Surgical History:  Procedure Laterality Date  . IR  KYPHO THORACIC WITH BONE BIOPSY  01/04/2017  . IR RADIOLOGIST EVAL & MGMT  01/10/2017  . NM MYOCAR PERF WALL MOTION  09/13/2011   Low risk  . PERMANENT PACEMAKER GENERATOR CHANGE  03/27/2009   St.Jude  . US ECHOCARDIOGRAPHY  03/28/2012   Mod LAE,mild MR,aortic sclerosis w/mod AI,mod. TR,mild PI,Stage I diastolic dysfunction  . VIDEO BRONCHOSCOPY Bilateral 10/07/2016   Procedure: VIDEO BRONCHOSCOPY WITH FLUORO;  Surgeon: Marshell Garfinkel, MD;  Location: [facility];  Service: Cardiopulmonary;  Laterality: Bilateral;      Social History:     Social History  Substance Use Topics  . Smoking status: Former Smoker    Packs/day: 0.50    Years: 60.00    Types: Cigarettes  . Smokeless tobacco: Never Used     Comment: quit 12/2016  . Alcohol use No     Lives - at ALF ([facility])  Mobility -   Walks by self at baseline   Family History :     Family History  Problem Relation Age of Onset  . Stroke Mother       Home Medications:   Prior to Admission medications   Medication Sig Start  Date End Date Taking? Authorizing Provider  acetaminophen (TYLENOL) 325 MG tablet Take 325 mg by mouth every 6 (six) hours as needed for mild pain, moderate pain or fever.    Yes [provider]  acetaminophen (TYLENOL) 650 MG suppository Place 650 mg rectally every 6 (six) hours as needed for moderate pain.   Yes [provider]  alendronate (FOSAMAX) 70 MG tablet Take 70 mg by mouth every Saturday. Take with a full glass of water on an empty stomach.   Yes [provider]  ALPRAZolam (XANAX) 0.25 MG tablet Take 0.25 mg by mouth at bedtime.    Yes [provider]  apixaban (ELIQUIS) 2.5 MG TABS tablet Take 1 tablet (2.5 mg total) by mouth 2 (two) times daily. 03/29/17  Yes Croitoru, Mihai, MD  docusate sodium (COLACE) 100 MG capsule Take 100 mg by mouth daily.    Yes [provider]  escitalopram (LEXAPRO) 10 MG tablet Take 10 mg by mouth daily.   Yes [provider]  furosemide (LASIX) 40 MG tablet Take 1 tablet (40 mg total) by mouth daily as needed (for weight gain of greater than 2lbs). 02/21/17  Yes Croitoru, Mihai, MD  HYDROcodone-acetaminophen (NORCO/VICODIN) 5-325 MG tablet Take 1-2 tablets by mouth every 4 (four) hours as needed for moderate pain. Patient taking differently: Take 1 tablet by mouth every 4 (four) hours as needed for moderate pain.  01/05/17  Yes Dessa Phi Chahn-Yang, DO  Ipratropium-Albuterol (COMBIVENT RESPIMAT) 20-100 MCG/ACT AERS respimat Inhale 1 puff into the lungs every 6 (six) hours as needed for wheezing or shortness of breath. 08/29/15  Yes Barton Dubois, MD  metoprolol succinate (TOPROL-XL) 25 MG 24 hr tablet Take 25 mg by mouth daily.    Yes [provider]  midodrine (PROAMATINE) 2.5 MG tablet Take 2.5 mg by mouth as needed (when systolic BP is lower than 100).    Yes [provider]  mirtazapine (REMERON) 15 MG tablet Take 7.5 mg by mouth at bedtime.    Yes [provider]    ondansetron (ZOFRAN) 4 MG tablet Take 4 mg by mouth every 6 (six) hours as needed for nausea or vomiting.   Yes [provider]  predniSONE (DELTASONE) 10 MG tablet Take 1 tablet (10 mg total) by mouth daily with  breakfast. 04/05/17  Yes Hosie Poisson, MD  albuterol (PROVENTIL) (2.5 MG/3ML) 0.083% nebulizer solution Take 3 mLs (2.5 mg total) by nebulization every 6 (six) hours as needed for wheezing or shortness of breath. Patient not taking: Reported on 04/21/2017 04/04/17   Hosie Poisson, MD  Fluticasone-Umeclidin-Vilant (TRELEGY ELLIPTA) 100-62.5-25 MCG/INH AEPB Inhale 1 puff into the lungs daily. Patient not taking: Reported on 04/21/2017 04/13/17   Marshell Garfinkel, MD  potassium chloride SA (K-DUR,KLOR-CON) 20 MEQ tablet Take 20 mEq by mouth daily as needed (when taking Lasix).     [provider]     Allergies:    No Known Allergies   Physical Exam:   Vitals  Blood pressure (!) 87/66, pulse (!) 101, temperature 97.8 F (36.6 C), resp. rate (!) 34, SpO2 91 %.   1. General   lying in bed in NAD,    2. Normal affect and insight, Not Suicidal or Homicidal, Awake Alert, Oriented X 3.  3. No F.N deficits, ALL C.Nerves Intact, Strength 5/5 all 4 extremities, Sensation intact all 4 extremities, Plantars down going.  4. Ears and Eyes appear Normal, Conjunctivae clear, PERRLA. Moist Oral Mucosa.  5. Supple Neck, No JVD, No cervical lymphadenopathy appriciated, No Carotid Bruits.  6. Symmetrical Chest wall movement, + crackles bilateral bases, no wheezing  7. RRR, No Gallops, Rubs or Murmurs, No Parasternal Heave.  8. Positive Bowel Sounds, Abdomen Soft, No tenderness, No organomegaly appriciated,No rebound -guarding or rigidity.  9.  No Cyanosis, Normal Skin Turgor, No Skin Rash or Bruise.  10. Good muscle tone,  joints appear normal , no effusions, Normal ROM.  11. No Palpable Lymph Nodes in Neck or Axillae     Data Review:    CBC  Recent Labs Lab  04/21/17 2245  WBC 19.0*  HGB 12.1  HCT 39.1  PLT 435*  MCV 91.4  MCH 28.3  MCHC 30.9  RDW 15.0  LYMPHSABS 3.2  MONOABS 1.5*  EOSABS 0.1  BASOSABS 0.0   ------------------------------------------------------------------------------------------------------------------  Chemistries   Recent Labs Lab 04/21/17 2245  NA 140  K 4.0  CL 106  CO2 26  GLUCOSE 110*  BUN 20  CREATININE 0.87  CALCIUM 8.8*  AST 25  ALT 16  ALKPHOS 72  BILITOT 0.8   ------------------------------------------------------------------------------------------------------------------ CrCl cannot be calculated (Unknown ideal weight.). ------------------------------------------------------------------------------------------------------------------ No results for input(s): TSH, T4TOTAL, T3FREE, THYROIDAB in the last 72 hours.  Invalid input(s): FREET3  Coagulation profile No results for input(s): INR, PROTIME in the last 168 hours. ------------------------------------------------------------------------------------------------------------------- No results for input(s): DDIMER in the last 72 hours. -------------------------------------------------------------------------------------------------------------------  Cardiac Enzymes No results for input(s): CKMB, TROPONINI, MYOGLOBIN in the last 168 hours.  Invalid input(s): CK ------------------------------------------------------------------------------------------------------------------    Component Value Date/Time   BNP 722.3 (H) 04/02/2017 1859   BNP 525.6 (H) 11/04/2015 1522     ---------------------------------------------------------------------------------------------------------------  Urinalysis    Component Value Date/Time   COLORURINE YELLOW 10/11/2016 0033   APPEARANCEUR CLEAR 10/11/2016 0033   LABSPEC 1.016 10/11/2016 0033   PHURINE 6.0 10/11/2016 0033   GLUCOSEU NEGATIVE 10/11/2016 0033   HGBUR SMALL (A) 10/11/2016 0033     BILIRUBINUR NEGATIVE 10/11/2016 0033   KETONESUR NEGATIVE 10/11/2016 0033   PROTEINUR NEGATIVE 10/11/2016 0033   UROBILINOGEN 1.0 01/19/2011 1030   NITRITE NEGATIVE 10/11/2016 0033   LEUKOCYTESUR NEGATIVE 10/11/2016 0033    ----------------------------------------------------------------------------------------------------------------   Imaging Results:    Dg Chest Portable 1 View  Result Date: 04/21/2017 CLINICAL DATA:  Shortness of breath.  Recent diagnosis  of pneumonia. EXAM: PORTABLE CHEST 1 VIEW COMPARISON:  Radiograph 04/02/2017.  Chest CTA 01/02/2017 FINDINGS: Right-sided pacemaker in place. Cardiomegaly with progression. Atherosclerosis of the aortic arch. Diffuse pulmonary edema. Left pleural effusion and basilar opacity, progressed from prior exam. Persistent nodular opacity in the left lung base. Small right pleural effusion and atelectasis also noted. No pneumothorax. IMPRESSION: CHF with increasing cardiomegaly, pulmonary edema, and bilateral pleural effusions, left greater than right. Left basilar opacities, some of which is nodular, increased from prior exam, may be infectious or inflammatory. No nodules were seen chest CTA 01/02/2017 or radiographs 03/18/2017, making neoplasm unlikely. Electronically Signed   By: Jeb Levering M.D.   On: 04/21/2017 23:11   EKG paced at 90    Assessment & Plan:    Principal Problem:   HCAP (healthcare-associated pneumonia)    Acute hypoxic respiratory failure, Hcap Blood culture x2 Urine strep antigen, urine legionella antigen vanco iv, cefepime iv pharmacy to dose  Pleural effusion (left) Check CT scan chest   Copd Solumedrol 80mg  iv q8h Cont duoneb prn Albuterol 1 neb K3T   CHF (diastolic) Strict I and O Daily weight  Cont lasix Cont potassium  Pafib Cont eliquis Cont Toprol   Dementia (mild) Consider palliative care consult in am  PMR Hold prednisone, while on solumedrol  DVT Prophylaxis eliquis,   SCDs  AM Labs Ordered, also please review Full Orders  Family Communication: Admission, patients condition and plan of care including tests being ordered have been discussed with the patient  who indicate understanding and agree with the plan and Code Status.  Code Status DNR  Likely DC to  home  Condition GUARDED    Consults called: none  Admission status: inpatient   Time spent in minutes : 45   Jani Gravel M.D on 04/22/2017 at 12:39 AM  Between 7am to 7pm - Pager - [phone]. After 7pm go to [email or web address] - password [facility]  [facility] - Office  [phone]

## 2017-04-22 NOTE — ED Notes (Signed)
ADMISSION MD PRESENT SPEAKING WITH DAUGHTER AND PT

## 2017-04-22 NOTE — Progress Notes (Signed)
PHARMACIST - PHYSICIAN COMMUNICATION  CONCERNING: P&T Medication Policy Regarding Oral Bisphosphonates  RECOMMENDATION: Your order for alendronate (Fosamax), ibandronate (Boniva), or risedronate (Actonel) has been discontinued at this time.  If the patient's post-hospital medical condition warrants safe use of this class of drugs, please resume the pre-hospital regimen upon discharge.  DESCRIPTION:  Alendronate (Fosamax), ibandronate (Boniva), and risedronate (Actonel) can cause severe esophageal erosions in patients who are unable to remain upright at least 30 minutes after taking this medication.   Since brief interruptions in therapy are thought to have minimal impact on bone mineral density, the [facility] has established that bisphosphonate orders should be routinely discontinued during hospitalization.   To override this safety policy and permit administration of Boniva, Fosamax, or Actonel in the hospital, prescribers must write "DO NOT HOLD" in the comments section when placing the order for this class of medications.  Thanks Dorrene German 04/22/2017 3:37 AM

## 2017-04-22 NOTE — ED Notes (Signed)
ED TO INPATIENT HANDOFF REPORT  Name/Age/Gender [name] 83 y.o. female  Code Status Code Status History    Date Active Date Inactive Code Status Order ID Comments User Context   04/02/2017 10:56 PM 04/04/2017  8:46 PM DNR [id number]  Reubin Milan, MD Inpatient   03/19/2017  3:14 AM 03/20/2017  7:15 PM DNR [id number]  Karmen Bongo, MD ED   01/01/2017  5:17 PM 01/05/2017  6:22 PM Full Code [id number]  Shon Millet, DO Inpatient   10/11/2016  3:19 AM 10/13/2016  6:29 PM DNR [id number]  Toy Baker, MD Inpatient   10/05/2016 12:41 PM 10/10/2016  6:15 PM Full Code [id number]  Debbe Odea, MD ED   07/15/2016  3:28 PM 07/18/2016  4:25 PM Full Code [id number]  Patrecia Pour, MD Inpatient   10/31/2015 10:43 PM 11/02/2015  4:39 PM Full Code [id number]  Theressa Millard, MD Inpatient   08/26/2015  3:54 AM 08/29/2015  5:52 PM Full Code [id number]  Theressa Millard, MD Inpatient   08/26/2015  1:53 AM 08/26/2015  3:54 AM Full Code [id number]  Theressa Millard, MD ED   06/07/2015  5:31 AM 06/10/2015  8:17 PM Full Code [id number]  Etta Quill, DO ED    Questions for Most Recent Historical Code Status (Order [id number])    Question Answer Comment   In the event of cardiac or respiratory ARREST Do not call a "code blue"    In the event of cardiac or respiratory ARREST Do not perform Intubation, CPR, defibrillation or ACLS    In the event of cardiac or respiratory ARREST Use medication by any route, position, wound care, and other measures to relive pain and suffering. May use oxygen, suction and manual treatment of airway obstruction as needed for comfort.       Home/SNF/Other Skilled nursing facility  Chief Complaint Shortness of Breath  Level of Care/Admitting Diagnosis ED Disposition    ED Disposition Condition [facility]: [place] [[id number]]  Level of Care: Stepdown [14]  Admit to SDU based on following criteria: Hemodynamic  compromise or significant risk of instability:  Patient requiring short term acute titration and management of vasoactive drips, and invasive monitoring (i.e., CVP and Arterial line).  Diagnosis: HCAP (healthcare-associated pneumonia) [992426]  Admitting Physician: Jani Gravel [[id number]]  Attending Physician: Jani Gravel [phone]  Estimated length of stay: past midnight tomorrow  Certification:: I certify this patient will need inpatient services for at least 2 midnights  PT Class (Do Not Modify): Inpatient [101]  PT Acc Code (Do Not Modify): Private [1]       Medical History Past Medical History:  Diagnosis Date  . CHB (complete heart block) ([place]) 07/13/2013   Pacemaker dependent  . CHF (congestive heart failure) ([place])   . COPD (chronic obstructive pulmonary disease) ([place])   . DM (dermatomyositis)   . Orthostatic hypotension   . Pacemaker 07/13/2013   Her original pacemaker and the current leads were implanted in 1992. She has had 2 generator change out, most recently in 2010. Her device is a Buyer, retail 2110 non RF dual-chamber pacemaker with a battery longevity estimated at about 7 years. The atrial lead is a St. Jude [id number] and the ventricular lead was a Biotronik [id number].   [name] Paroxysmal atrial fibrillation ([place]) 09/04/2014   on Eliquis  . Polymyalgia rheumatica ([place])   . Scoliosis   . SSS (sick sinus syndrome) ([place]) 07/13/2013  . Syncope   .  Systemic hypertension   . Thoracic kyphosis     Allergies No Known Allergies  IV Location/Drains/Wounds Patient Lines/Drains/Airways Status   Active Line/Drains/Airways    Name:   Placement date:   Placement time:   Site:   Days:   Peripheral IV 04/21/17 Right Hand  04/21/17    2330    Hand    1   Peripheral IV 04/22/17 Left Antecubital  04/22/17    0520    Antecubital    less than 1   External Urinary Catheter  04/02/17    1956        20          Labs/Imaging Results for orders placed or performed during the hospital encounter of  04/21/17 (from the past 48 hour(s))  CBC with Differential     Status: Abnormal   Collection Time: 04/21/17 10:45 PM  Result Value Ref Range   WBC 19.0 (H) 4.0 - 10.5 K/uL   RBC 4.28 3.87 - 5.11 MIL/uL   Hemoglobin 12.1 12.0 - 15.0 g/dL   HCT 39.1 36.0 - 46.0 %   MCV 91.4 78.0 - 100.0 fL   MCH 28.3 26.0 - 34.0 pg   MCHC 30.9 30.0 - 36.0 g/dL   RDW 15.0 11.5 - 15.5 %   Platelets 435 (H) 150 - 400 K/uL   Neutrophils Relative % 75 %   Neutro Abs 14.2 (H) 1.7 - 7.7 K/uL   Lymphocytes Relative 17 %   Lymphs Abs 3.2 0.7 - 4.0 K/uL   Monocytes Relative 8 %   Monocytes Absolute 1.5 (H) 0.1 - 1.0 K/uL   Eosinophils Relative 0 %   Eosinophils Absolute 0.1 0.0 - 0.7 K/uL   Basophils Relative 0 %   Basophils Absolute 0.0 0.0 - 0.1 K/uL  Comprehensive metabolic panel     Status: Abnormal   Collection Time: 04/21/17 10:45 PM  Result Value Ref Range   Sodium 140 135 - 145 mmol/L   Potassium 4.0 3.5 - 5.1 mmol/L   Chloride 106 101 - 111 mmol/L   CO2 26 22 - 32 mmol/L   Glucose, Bld 110 (H) 65 - 99 mg/dL   BUN 20 6 - 20 mg/dL   Creatinine, Ser 0.87 0.44 - 1.00 mg/dL   Calcium 8.8 (L) 8.9 - 10.3 mg/dL   Total Protein 6.6 6.5 - 8.1 g/dL   Albumin 3.3 (L) 3.5 - 5.0 g/dL   AST 25 15 - 41 U/L   ALT 16 14 - 54 U/L   Alkaline Phosphatase 72 38 - 126 U/L   Total Bilirubin 0.8 0.3 - 1.2 mg/dL   GFR calc non Af Amer 59 (L) >60 mL/min   GFR calc Af Amer >60 >60 mL/min    Comment: (NOTE) The eGFR has been calculated using the CKD EPI equation. This calculation has not been validated in all clinical situations. eGFR's persistently <60 mL/min signify possible Chronic Kidney Disease.    Anion gap 8 5 - 15  I-Stat CG4 Lactic Acid, ED     Status: None   Collection Time: 04/21/17 10:58 PM  Result Value Ref Range   Lactic Acid, Venous 1.56 0.5 - 1.9 mmol/L  Brain natriuretic peptide     Status: Abnormal   Collection Time: 04/21/17 11:30 PM  Result Value Ref Range   B Natriuretic Peptide 917.2 (H)  0.0 - 100.0 pg/mL  I-Stat Troponin, ED (not at [facility])     Status: None   Collection Time: 04/22/17 12:35  AM  Result Value Ref Range   Troponin i, poc 0.05 0.00 - 0.08 ng/mL   Comment 3            Comment: Due to the release kinetics of cTnI, a negative result within the first hours of the onset of symptoms does not rule out myocardial infarction with certainty. If myocardial infarction is still suspected, repeat the test at appropriate intervals.   I-Stat CG4 Lactic Acid, ED     Status: None   Collection Time: 04/22/17 12:37 AM  Result Value Ref Range   Lactic Acid, Venous 1.82 0.5 - 1.9 mmol/L   Dg Chest Portable 1 View  Result Date: 04/21/2017 CLINICAL DATA:  Shortness of breath.  Recent diagnosis of pneumonia. EXAM: PORTABLE CHEST 1 VIEW COMPARISON:  Radiograph 04/02/2017.  Chest CTA 01/02/2017 FINDINGS: Right-sided pacemaker in place. Cardiomegaly with progression. Atherosclerosis of the aortic arch. Diffuse pulmonary edema. Left pleural effusion and basilar opacity, progressed from prior exam. Persistent nodular opacity in the left lung base. Small right pleural effusion and atelectasis also noted. No pneumothorax. IMPRESSION: CHF with increasing cardiomegaly, pulmonary edema, and bilateral pleural effusions, left greater than right. Left basilar opacities, some of which is nodular, increased from prior exam, may be infectious or inflammatory. No nodules were seen chest CTA 01/02/2017 or radiographs 03/18/2017, making neoplasm unlikely. Electronically Signed   By: Jeb Levering M.D.   On: 04/21/2017 23:11    Pending Labs [employer]    Start     Ordered   04/21/17 2212  Blood culture (routine x 2)  BLOOD CULTURE X 2,   STAT     04/21/17 2211   Signed and Held  HIV antibody  Once,   R     Signed and Held   Signed and Held  Culture, blood (routine x 2) Call MD if unable to obtain prior to antibiotics being given  BLOOD CULTURE X 2,   R    Comments:  If blood cultures drawn in  Emergency Department - Do not draw and cancel order   Question:  Patient immune status  Answer:  Normal   Signed and Held   Signed and Held  Culture, sputum-assessment  Once,   R    Question:  Patient immune status  Answer:  Normal   Signed and Held   Signed and Held  Gram stain  Once,   R    Question:  Patient immune status  Answer:  Normal   Signed and Held   Signed and Held  Strep pneumoniae urinary antigen  Once,   R     Signed and Held   Signed and Held  CBC  Tomorrow morning,   R     Signed and Held   Signed and Held  Legionella Pneumophila Serogp 1 Ur Ag  Once,   R     Signed and Held      Vitals/Pain Today's Vitals   04/22/17 0430 04/22/17 0445 04/22/17 0500 04/22/17 0725  BP: (!) 103/56 (!) 116/51 (!) 146/66   Pulse: 77 65 69   Resp:      Temp:      SpO2: (!) 84% (!) 83% 98% 95%  Weight:      Height:      PainSc:        Isolation Precautions No active isolations  Medications Medications  nitroGLYCERIN (NITROSTAT) SL tablet 0.4 mg (0.4 mg Sublingual Given 04/21/17 2333)  nitroGLYCERIN 50 mg in dextrose 5 % 250  mL (0.2 mg/mL) infusion (16.667 mcg/min Intravenous New Bag/Given 04/22/17 0325)  ALPRAZolam (XANAX) tablet 0.25 mg (not administered)  0.9 %  sodium chloride infusion (not administered)  methylPREDNISolone sodium succinate (SOLU-MEDROL) 125 mg/2 mL injection 80 mg (80 mg Intravenous Given 04/22/17 0519)  albuterol (PROVENTIL) (2.5 MG/3ML) 0.083% nebulizer solution 2.5 mg (2.5 mg Nebulization Given 04/22/17 0133)  vancomycin (VANCOCIN) IVPB 750 mg/150 ml premix (not administered)  LORazepam (ATIVAN) injection 0.5 mg (0.5 mg Intravenous Given 04/22/17 0332)  ceFEPIme (MAXIPIME) 1 g in dextrose 5 % 50 mL IVPB (1 g Intravenous New Bag/Given 04/22/17 0531)  piperacillin-tazobactam (ZOSYN) IVPB 3.375 g (0 g Intravenous Stopped 04/22/17 0017)  vancomycin (VANCOCIN) IVPB 1000 mg/200 mL premix (0 mg Intravenous Stopped 04/22/17 0137)  ondansetron (ZOFRAN) injection 4 mg  (4 mg Intravenous Given 04/21/17 2245)  LORazepam (ATIVAN) injection 0.5 mg (0.5 mg Intravenous Given 04/21/17 2245)  furosemide (LASIX) injection 40 mg (40 mg Intravenous Given 04/21/17 2345)  ondansetron (ZOFRAN) injection 4 mg (4 mg Intravenous Given 04/22/17 0015)    Mobility {[id number]

## 2017-04-22 NOTE — Evaluation (Signed)
Clinical/Bedside Swallow Evaluation Patient Details  Name: [name] MRN: [id number] Date of Birth: 10-Aug-1930  Today's Date: 04/22/2017 Time: SLP Start Time (ACUTE ONLY): 1025 SLP Stop Time (ACUTE ONLY): 1158 SLP Time Calculation (min) (ACUTE ONLY): 26 min  Past Medical History:  Past Medical History:  Diagnosis Date  . CHB (complete heart block) ([place]) 07/13/2013   Pacemaker dependent  . CHF (congestive heart failure) ([place])   . COPD (chronic obstructive pulmonary disease) ([place])   . DM (dermatomyositis)   . Orthostatic hypotension   . Pacemaker 07/13/2013   Her original pacemaker and the current leads were implanted in 1992. She has had 2 generator change out, most recently in 2010. Her device is a Buyer, retail 2110 non RF dual-chamber pacemaker with a battery longevity estimated at about 7 years. The atrial lead is a St. Jude [id number] and the ventricular lead was a Biotronik [id number].   [name] Paroxysmal atrial fibrillation ([place]) 09/04/2014   on Eliquis  . Polymyalgia rheumatica ([place])   . Scoliosis   . SSS (sick sinus syndrome) ([place]) 07/13/2013  . Syncope   . Systemic hypertension   . Thoracic kyphosis    Past Surgical History:  Past Surgical History:  Procedure Laterality Date  . IR KYPHO THORACIC WITH BONE BIOPSY  01/04/2017  . IR RADIOLOGIST EVAL & MGMT  01/10/2017  . NM MYOCAR PERF WALL MOTION  09/13/2011   Low risk  . PERMANENT PACEMAKER GENERATOR CHANGE  03/27/2009   St.Jude  . US ECHOCARDIOGRAPHY  03/28/2012   Mod LAE,mild MR,aortic sclerosis w/mod AI,mod. TR,mild PI,Stage I diastolic dysfunction  . VIDEO BRONCHOSCOPY Bilateral 10/07/2016   Procedure: VIDEO BRONCHOSCOPY WITH FLUORO;  Surgeon: Marshell Garfinkel, MD;  Location: [facility];  Service: Cardiopulmonary;  Laterality: Bilateral;   HPI:  CarolWinslowis a 83 y.o.female, w sick sinus syndrome, complete hb, s/p pacer, diastolic CHF, Pafib, Copd, dermatomyostitis/PMR, scoliosis, dementia who apparently developed sob this  past evening. Pt notes cough with yellow sputum. Pt with recent hospitalization 04/02/17 for PNA. Chest x ray 11/2 revealed CHF with increasing cardiomegaly, pulmonary edema, and bilateral   Assessment / Plan / Recommendation Clinical Impression  Pt presents with a suspected mild oropharyngeal dysphagia which is impacted by her lethargy this date and hx of cognitive impairment. Pt semi lethargic though with cueing improved alertness for PO trials.  Swallow hx obtained from pts daughter who was at bedside.  Pts medical hx significant for recurrent PNA though pts daughter is unaware of overt choking episodes or identified premorbid dysphagia. Pts daughter did however report increased throat clearing during and post PO consumption. No overt s/sx of aspiration with any consistencies this date. Multiple swallows exhibited as well as delayed oral transit. Recommend proceed with instrumental objective swallow testing to identify if swallow function contributes to pts recurrent PNA. MBS to be completed this date.  SLP Visit Diagnosis: Dysphagia, unspecified (R13.10)    Aspiration Risk  Mild aspiration risk    Diet Recommendation  NPO pending completion of MBS this date         Other  Recommendations Oral Care Recommendations: Oral care QID   Follow up Recommendations 24 hour supervision/assistance (prior resident of ALF)      Frequency and Duration min 2x/week  1 week       Prognosis Prognosis for Safe Diet Advancement: Good Barriers to Reach Goals: Cognitive deficits      Swallow Study   General Date of Onset: 04/21/17 HPI: CarolWinslowis a 83 y.o.female, w sick  sinus syndrome, complete hb, s/p pacer, diastolic CHF, Pafib, Copd, dermatomyostitis/PMR, scoliosis, dementia who apparently developed sob this past evening. Pt notes cough with yellow sputum. Pt with recent hospitalization 04/02/17 for PNA. Chest x ray 11/2 revealed CHF with increasing cardiomegaly, pulmonary edema, and  bilateral Type of Study: Bedside Swallow Evaluation Previous Swallow Assessment: BSE: WFL, no objective swallow testing  Diet Prior to this Study: NPO Temperature Spikes Noted: No Respiratory Status: Nasal cannula History of Recent Intubation: No Behavior/Cognition: Requires cueing;Alert;Cooperative Oral Cavity Assessment: Dry Oral Care Completed by SLP: Yes Oral Cavity - Dentition: Dentures, top;Dentures, bottom Self-Feeding Abilities: Needs assist Patient Positioning: Upright in bed Baseline Vocal Quality: Low vocal intensity Volitional Cough: Weak;Congested Volitional Swallow: Able to elicit    Oral/Motor/Sensory Function Overall Oral Motor/Sensory Function: Generalized oral weakness   Ice Chips Ice chips: Impaired Presentation: Spoon Oral Phase Functional Implications: Prolonged oral transit Pharyngeal Phase Impairments: Suspected delayed Swallow;Multiple swallows   Thin Liquid Thin Liquid: Impaired Presentation: Cup;Straw Oral Phase Functional Implications: Prolonged oral transit Pharyngeal  Phase Impairments: Suspected delayed Swallow;Multiple swallows;Throat Clearing - Delayed    Nectar Thick Nectar Thick Liquid: Not tested   Honey Thick Honey Thick Liquid: Not tested   Puree Puree: Impaired Presentation: Spoon Oral Phase Impairments: Reduced labial seal Oral Phase Functional Implications: Prolonged oral transit Pharyngeal Phase Impairments: Suspected delayed Swallow;Multiple swallows   Solid   GO   Solid: Impaired Presentation: Self Fed Oral Phase Functional Implications: Prolonged oral transit Pharyngeal Phase Impairments: Suspected delayed Swallow;Multiple swallows       Arvil Chaco MA, CCC-SLP Acute Care Speech Language Pathologist    Levi Aland 04/22/2017,12:01 PM

## 2017-04-22 NOTE — ED Notes (Addendum)
RRT KEN STATES PT IS CURRENTLY ON CPAP. PT CAN COME OFF TRANSPORT AND PLACED ON [address] FOR OXYGEN TO KEEP 02 SATS UP. 3-4 LITERS. PT IS CURRENTLY RECEIVING BREATHING TX

## 2017-04-22 NOTE — Progress Notes (Signed)
PMT no charge note.  PMT consult request received, chart reviewed.   The patient is sitting up in bed, she is currently off BIPAP, attempting to eat lunch, she does have O2 desaturations while attempting to eat.   Daughter [name] is by her bedside, she ushered me outside the room. Daughter stated that she would like to pre meet with me, have an initial palliative care meeting, she would like for me to come meet with the patient tomorrow morning.   I met with daughter [name] in conference room, outside the step down unit at [facility].   I introduced myself and palliative care as follows: Palliative medicine is specialized medical care for people living with serious illness. It focuses on providing relief from the symptoms and stress of a serious illness. The goal is to improve quality of life for both the patient and the family.  The patient's husband died at [facility] 4 years ago, he had cancer.   [name] is the patient's only living child and her HCPOA agent, patient had a son who died a few years ago.   The patient has been a life long smoker, she quite smoking recently- 4-5 months ago.   The patient has had recurrent hospitalizations, she has had gradual progressive decline from her COPD as well as d CHF. Patient has states several times to her daughter, in these past few months, that she is "tired, that she is "done".   Patient has been living in [facility] for a few months now, patient has refused to wear her O2, she does not like supplemental O2 for various reasons.   Patient has also been having more memory impairments, more cognitive decline for the past few months now.   We discussed about what goals, wishes and values would be important to the patient. We discussed about hospice philosophy of care, about whether or not hospice services can follow her at [facility].   We reviewed disease trajectory for CHF COPD being one of ongoing gradual progressive  decline, recurrent hospitalizations. Daughter understands completely.   We may end up completing a MOST form:DNR DNI, NO PEG, comfort measures, avoid hospitalization and electing for hospice services to follow the patient at [facility], depending on how much the patient is able to comprehend and participate in tomorrow's meeting scheduled for 11 AM on 04-23-17.   Full note and final recommendations to follow Thank you for the consult.   Loistine Chance MD [phone]

## 2017-04-22 NOTE — Progress Notes (Signed)
Modified Barium Swallow Progress Note  Patient Details  Name: [name] MRN: [id number] Date of Birth: 1930-12-11  Today's Date: 04/22/2017  Modified Barium Swallow completed.  Full report located under Chart Review in the Imaging Section.  Brief recommendations include the following:  Clinical Impression Pt presents with a mild to moderate oropharyngeal dypsphagia with sensory and motor involvement. Oral phase characterized by delayed oral transit and premature spillage. Pt with consistent mistimed swallow initiation as well as decreased laryngeal vestibule closure resulting in frequent deep penetration before the swallow of thin and nectar thick liquids. Pentration amount increased with larger bolus quanities. Trace penetrates remained in the laryngeal vestibule at the level of the vocal cords post swallow. Cues for throat clear aided in clearance, though pt did not consistently sense penetration instances. No penetration noted with puree, honey thick, or regular consistencies. No aspiration visualized throughout the swallow study, however anticipate aross a meal pt is having trace aspiration based upon swallow deficits noted on MBS and pts hx including recurrent PNA. Discussed in length swallow deficits with pt and pts daughter whom at this time prefer not to have diet modifications and instead focus on quality of life. A regular diet and thin liquids with swallow precautions was requested. Safe swallow strategies recommended include no straws, no mixed consistencies, and medicines whole in puree with full supervision with all PO. Pts daughter mentioned future palliative consult. ST to continue to monitor for needs and goals of care.       Swallow Evaluation Recommendations       SLP Diet Recommendations: Regular solids;Thin liquid;No mixed consistencies   Liquid Administration via: Cup;No straw   Medication Administration: Whole meds with puree   Supervision: Full supervision/cueing  for compensatory strategies;Staff to assist with self feeding   Compensations: Small sips/bites;Slow rate;Minimize environmental distractions;Clear throat intermittently;Multiple dry swallows after each bite/sip   Postural Changes: Remain semi-upright after after feeds/meals (Comment);Seated upright at 90 degrees   Oral Care Recommendations: Oral care BID       Arvil Chaco MA, [place] 04/22/2017,1:27 PM

## 2017-04-22 NOTE — Progress Notes (Signed)
Pharmacy Antibiotic Note  [name] is a 83 y.o. female with SOB admitted on 04/21/2017 with pneumonia.  Pharmacy has been consulted for vancomycin dosing.  Plan: Cefepime 1 Gm IV q12h (MD) Vancomycin 1 Gm x1 then 750 mg IV q24h for est AUC=514 Goal AUC 400-500 F/u scr/cultures/levels      Temp (24hrs), Avg:97.8 F (36.6 C), Min:97.8 F (36.6 C), Max:97.8 F (36.6 C)   Recent Labs Lab 04/21/17 2245 04/21/17 2258 04/22/17 0037  WBC 19.0*  --   --   CREATININE 0.87  --   --   LATICACIDVEN  --  1.56 1.82    CrCl cannot be calculated (Unknown ideal weight.).    No Known Allergies  Antimicrobials this admission: 11/2 zosyn >> x1 ED 11/2 vancomycin >>  11/3 cefepime >>  Dose adjustments this admission:   Microbiology results:  BCx:   UCx:    Sputum:    MRSA PCR:   Thank you for allowing pharmacy to be a part of this patient's care.  Dorrene German 04/22/2017 1:02 AM

## 2017-04-23 ENCOUNTER — Other Ambulatory Visit: Payer: Self-pay

## 2017-04-23 DIAGNOSIS — J9601 Acute respiratory failure with hypoxia: Secondary | ICD-10-CM

## 2017-04-23 LAB — BASIC METABOLIC PANEL
Anion gap: 12 (ref 5–15)
BUN: 23 mg/dL — ABNORMAL HIGH (ref 6–20)
CALCIUM: 8.3 mg/dL — AB (ref 8.9–10.3)
CHLORIDE: 94 mmol/L — AB (ref 101–111)
CO2: 32 mmol/L (ref 22–32)
CREATININE: 0.79 mg/dL (ref 0.44–1.00)
GFR calc non Af Amer: >60 mL/min (ref >60)
Glucose, Bld: 152 mg/dL — ABNORMAL HIGH (ref 65–99)
POTASSIUM: 3.8 mmol/L (ref 3.5–5.1)
SODIUM: 138 mmol/L (ref 135–145)

## 2017-04-23 LAB — CBC
HCT: 34.9 % — ABNORMAL LOW (ref 36.0–46.0)
HEMOGLOBIN: 11.4 g/dL — AB (ref 12.0–15.0)
MCH: 28.9 pg (ref 26.0–34.0)
MCHC: 32.7 g/dL (ref 30.0–36.0)
MCV: 88.6 fL (ref 78.0–100.0)
PLATELETS: 337 10*3/uL (ref 150–400)
RBC: 3.94 MIL/uL (ref 3.87–5.11)
RDW: 15.1 % (ref 11.5–15.5)
WBC: 22.9 10*3/uL — AB (ref 4.0–10.5)

## 2017-04-23 LAB — HIV ANTIBODY (ROUTINE TESTING W REFLEX): HIV Screen 4th Generation wRfx: NONREACTIVE

## 2017-04-23 MED ORDER — ALBUTEROL SULFATE (2.5 MG/3ML) 0.083% IN NEBU
2.5000 mg | INHALATION_SOLUTION | Freq: Three times a day (TID) | RESPIRATORY_TRACT | Status: DC
  Administered 2017-04-23 – 2017-04-24 (×6): 2.5 mg via RESPIRATORY_TRACT
  Filled 2017-04-23 (×6): qty 3

## 2017-04-23 MED ORDER — FUROSEMIDE 10 MG/ML IJ SOLN
40.0000 mg | Freq: Every day | INTRAMUSCULAR | Status: DC
  Administered 2017-04-23: 40 mg via INTRAVENOUS
  Filled 2017-04-23: qty 4

## 2017-04-23 MED ORDER — ALPRAZOLAM 0.25 MG PO TABS
0.2500 mg | ORAL_TABLET | Freq: Once | ORAL | Status: AC
  Administered 2017-04-23: 0.25 mg via ORAL
  Filled 2017-04-23: qty 1

## 2017-04-23 NOTE — Consult Note (Signed)
Consultation Note Date: 04/23/2017   Patient Name: [name]  DOB: 06-30-1930  MRN: [id number]  Age / Sex: 83 y.o., female  PCP: Lajean Manes, MD Referring Physician: Bonnielee Haff, MD  Reason for Consultation: Establishing goals of care  HPI/Patient Profile: 83 y.o. female  with past medical history of   admitted on 04/21/2017 with  .   Clinical Assessment and Goals of Care:  83 year old lady who lives at [facility]. She has a past medical history significant for paroxysmal atrial fibrillation, diastolic congestive heart failure, history of complete heart block requiring permanent pacemaker placement, also has underlying chronic obstructive pulmonary disease and dementia. Patient has had multiple recurrent hospitalizations because of worsening respiratory status either a combination of COPD exacerbation as well as diastolic congestive heart failure or both.  Palliative consultation for goals of care discussions.  I met with the patient's daughter [name] outside the room and did a pre-meeting with her primary daughter [name] healthcare power of attorney's request on 04-22-17. This morning, I met with the patient at the bedside. She is awake reasonably alert resting comfortably. She does not have any chest pain she denies shortness of breath. Daughter and son-in-law are present at the bedside. I introduced myself and palliative care as follows: Palliative medicine is specialized medical care for people living with serious illness. It focuses on providing relief from the symptoms and stress of a serious illness. The goal is to improve quality of life for both the patient and the family.  Goals wishes and values important to the patient discussed in detail. The patient does recognize that she is getting tired of all the hospitalizations. She likes going to [facility] and likes playing the  slot machines. She recalls that this is something both her and her husband used to enjoy doing.  Overall, patient is not fully aware of the extent of her serious underlying irreversible conditions and their chronic progressive decline in nature. She required BiPAP this hospitalization.  We discussed about adding palliative services at [facility]. We discussed extensively about palliative philosophy of care. Husband died 4 years ago at [facility]. Patient has had refused home oxygen several times even when it has been recommended by pulmonology. She quit smoking 4-5 months ago. She does have a living will citing a desire for natural death. Her only living child is her daughter [name] who is present at the bedside. See recommendations below. Thank you for the consult.  HCPOA  daughter [name]   SUMMARY OF RECOMMENDATIONS    Agree with DNR DNI Recommend d/c back to [facility] with palliative care following, may need full hospice services soon Continue current mode of care Thank you for the consult.   Code Status/Advance Care Planning:  DNR    Symptom Management:     As above  Palliative Prophylaxis:   Bowel Regimen  Psycho-social/Spiritual:   Desire for further Chaplaincy support:no  Additional Recommendations: Caregiving  Support/Resources  Prognosis:   Unable to determine  Discharge Planning: [place]  for rehab with Palliative care service follow-up      Primary Diagnoses: Present on Admission: . HCAP (healthcare-associated pneumonia)   I have reviewed the medical record, interviewed the patient and family, and examined the patient. The following aspects are pertinent.  Past Medical History:  Diagnosis Date  . CHB (complete heart block) ([place]) 07/13/2013   Pacemaker dependent  . CHF (congestive heart failure) ([place])   . COPD (chronic obstructive pulmonary disease) ([place])   . DM (dermatomyositis)   . Orthostatic hypotension   . Pacemaker 07/13/2013    Her original pacemaker and the current leads were implanted in 1992. She has had 2 generator change out, most recently in 2010. Her device is a Buyer, retail 2110 non RF dual-chamber pacemaker with a battery longevity estimated at about 7 years. The atrial lead is a St. Jude [id number] and the ventricular lead was a Biotronik [id number].   [name] Paroxysmal atrial fibrillation ([place]) 09/04/2014   on Eliquis  . Polymyalgia rheumatica ([place])   . Scoliosis   . SSS (sick sinus syndrome) ([place]) 07/13/2013  . Syncope   . Systemic hypertension   . Thoracic kyphosis    Social History   Socioeconomic History  . Marital status: Married    Spouse name: None  . Number of children: None  . Years of education: None  . Highest education level: None  Social Needs  . Financial resource strain: None  . Food insecurity - worry: None  . Food insecurity - inability: None  . Transportation needs - medical: None  . Transportation needs - non-medical: None  Occupational History  . Occupation: retired  Tobacco Use  . Smoking status: Former Smoker    Packs/day: 0.50    Years: 60.00    Pack years: 30.00    Types: Cigarettes  . Smokeless tobacco: Never Used  . Tobacco comment: quit 12/2016  Substance and Sexual Activity  . Alcohol use: No  . Drug use: No  . Sexual activity: Not Currently    Partners: Female  Other Topics Concern  . None  Social History Narrative  . None   Family History  Problem Relation Age of Onset  . Stroke Mother    Scheduled Meds: . albuterol  2.5 mg Nebulization TID  . apixaban  2.5 mg Oral BID  . Chlorhexidine Gluconate Cloth  6 each Topical Q0600  . docusate sodium  100 mg Oral Daily  . escitalopram  10 mg Oral Daily  . furosemide  40 mg Intravenous Daily  . mouth rinse  15 mL Mouth Rinse BID  . methylPREDNISolone (SOLU-MEDROL) injection  80 mg Intravenous Q8H  . metoprolol succinate  25 mg Oral Daily  . mirtazapine  7.5 mg Oral QHS  . mupirocin ointment  1 application Nasal  BID  . sodium chloride flush  3 mL Intravenous Q12H   Continuous Infusions: . sodium chloride    . ceFEPime (MAXIPIME) IV 1 g (04/23/17 0617)  . vancomycin Stopped (04/22/17 2300)   PRN Meds:.sodium chloride, HYDROcodone-acetaminophen, ipratropium-albuterol, midodrine, nitroGLYCERIN, ondansetron, sodium chloride flush Medications Prior to Admission:  Prior to Admission medications   Medication Sig Start Date End Date Taking? Authorizing Provider  acetaminophen (TYLENOL) 325 MG tablet Take 325 mg by mouth every 6 (six) hours as needed for mild pain, moderate pain or fever.    Yes [provider]  acetaminophen (TYLENOL) 650 MG suppository Place 650 mg rectally every 6 (six) hours as needed for moderate pain.   Yes [provider]  alendronate (FOSAMAX) 70 MG tablet Take 70 mg by mouth every Saturday. Take with a full glass of water on an empty stomach.   Yes [provider]  ALPRAZolam (XANAX) 0.25 MG tablet Take 0.25 mg by mouth at bedtime.    Yes [provider]  apixaban (ELIQUIS) 2.5 MG TABS tablet Take 1 tablet (2.5 mg total) by mouth 2 (two) times daily. 03/29/17  Yes Croitoru, Mihai, MD  docusate sodium (COLACE) 100 MG capsule Take 100 mg by mouth daily.    Yes [provider]  escitalopram (LEXAPRO) 10 MG tablet Take 10 mg by mouth daily.   Yes [provider]  furosemide (LASIX) 40 MG tablet Take 1 tablet (40 mg total) by mouth daily as needed (for weight gain of greater than 2lbs). 02/21/17  Yes Croitoru, Mihai, MD  HYDROcodone-acetaminophen (NORCO/VICODIN) 5-325 MG tablet Take 1-2 tablets by mouth every 4 (four) hours as needed for moderate pain. Patient taking differently: Take 1 tablet by mouth every 4 (four) hours as needed for moderate pain.  01/05/17  Yes Dessa Phi Chahn-Yang, DO  Ipratropium-Albuterol (COMBIVENT RESPIMAT) 20-100 MCG/ACT AERS respimat Inhale 1 puff into the lungs every 6 (six) hours as needed for wheezing or  shortness of breath. 08/29/15  Yes Barton Dubois, MD  metoprolol succinate (TOPROL-XL) 25 MG 24 hr tablet Take 25 mg by mouth daily.    Yes [provider]  midodrine (PROAMATINE) 2.5 MG tablet Take 2.5 mg by mouth as needed (when systolic BP is lower than 100).    Yes [provider]  mirtazapine (REMERON) 15 MG tablet Take 7.5 mg by mouth at bedtime.    Yes [provider]  ondansetron (ZOFRAN) 4 MG tablet Take 4 mg by mouth every 6 (six) hours as needed for nausea or vomiting.   Yes [provider]  predniSONE (DELTASONE) 10 MG tablet Take 1 tablet (10 mg total) by mouth daily with breakfast. 04/05/17  Yes Hosie Poisson, MD  albuterol (PROVENTIL) (2.5 MG/3ML) 0.083% nebulizer solution Take 3 mLs (2.5 mg total) by nebulization every 6 (six) hours as needed for wheezing or shortness of breath. Patient not taking: Reported on 04/21/2017 04/04/17   Hosie Poisson, MD  Fluticasone-Umeclidin-Vilant (TRELEGY ELLIPTA) 100-62.5-25 MCG/INH AEPB Inhale 1 puff into the lungs daily. Patient not taking: Reported on 04/21/2017 04/13/17   Marshell Garfinkel, MD  potassium chloride SA (K-DUR,KLOR-CON) 20 MEQ tablet Take 20 mEq by mouth daily as needed (when taking Lasix).     [provider]   No Known Allergies Review of Systems Denies pain Denies shortness of breath this am  Physical Exam Awake alert Elderly lady resting in bed S1 S2 Has trace edema LE bilaterally Abdomen soft Regular work of breathing Awake alert, she does not appear to be fully aware of her overall health/her multiple recent hospitalizations.   Vital Signs: BP 125/81   Pulse 78   Temp 97.8 F (36.6 C) (Oral)   Resp 17   Ht 5' 5" (1.651 m)   Wt 62.7 kg (138 lb 3.7 oz)   SpO2 97%   BMI 23.00 kg/m  Pain Assessment: 0-10   Pain Score: 0-No pain   SpO2: SpO2: 97 % O2 Device:SpO2: 97 % O2 Flow Rate: .O2 Flow Rate (L/min): 2 L/min  IO: Intake/output summary:   Intake/Output Summary  (Last 24 hours) at 04/23/2017 1256 Last data filed at 04/23/2017 0800 Gross per 24 hour  Intake 993 ml  Output 1150 ml  Net -  157 ml  PPS 30%  LBM: Last BM Date: 04/22/17 Baseline Weight: Weight: 63 kg (139 lb) Most recent weight: Weight: 62.7 kg (138 lb 3.7 oz)     Palliative Assessment/Data:     Time In:  11 Time Out:  12 Time Total:  60 min  Greater than 50%  of this time was spent counseling and coordinating care related to the above assessment and plan.  Signed by: Loistine Chance, MD  [phone]  Please contact Palliative Medicine Team phone at [phone] for questions and concerns.  For individual provider: See Shea Evans

## 2017-04-23 NOTE — Progress Notes (Signed)
[facility] PROGRESS NOTE  [name] ERX:[id number] DOB: 1931-03-27 DOA: 04/21/2017  PCP: Lajean Manes, MD  Brief History/Interval Summary: 83 year old Caucasian female with a history of sick sinus syndrome, complete heart block, pacemaker placement, diastolic CHF, paroxysmal atrial fibrillation on anticoagulation, COPD dementia who lives in assisted living facility and was brought into the hospital due to worsening respiratory status.  She was thought to have a combination of COPD exacerbation as well as diastolic CHF.  She was hospitalized for further management.  Reason for Visit: Acute respiratory failure with hypoxia.  Consultants: Palliative medicine  Procedures: None  Antibiotics: Vancomycin and cefepime  Subjective/Interval History: Patient feels better this morning.  Somewhat distracted.  Had some sundowning per the nursing staff.  Patient denies any pain.    ROS: No nausea or vomiting  Objective:  Vital Signs  Vitals:   04/22/17 2105 04/22/17 2300 04/23/17 0400 04/23/17 0800  BP:  (!) 97/45  125/81  Pulse:  78 81 78  Resp:  20 17 17   Temp:   97.8 F (36.6 C)   TempSrc:   Oral   SpO2: 100% 99% 99% 94%  Weight:      Height:        Intake/Output Summary (Last 24 hours) at 04/23/2017 0837 Last data filed at 04/23/2017 0617 Gross per 24 hour  Intake 813 ml  Output 1550 ml  Net -737 ml   Filed Weights   04/22/17 0303 04/22/17 0910  Weight: 63 kg (139 lb) 62.7 kg (138 lb 3.7 oz)    General appearance: Awake alert.  In no distress Resp: Improved air entry bilaterally.  Few crackles at the bases.  No wheezing.  Normal effort today.   Cardio: S1-S2 is normal regular.  No S3-S4.  No rubs murmurs or bruit.  No pedal edema. GI: Abdomen is soft.  Nontender nondistended.  Bowel sounds are present.  No masses organomegaly Extremities: Minimal edema bilateral lower extremities. Pulses: 2+ and symmetric Neurologic: Awake alert.  Moving all her  extremities.  No obvious focal neurological deficits.  Somewhat disoriented.  Lab Results:  Data Reviewed: I have personally reviewed following labs and imaging studies  CBC: Recent Labs  Lab 04/21/17 2245 04/22/17 0927 04/23/17 0329  WBC 19.0* 22.8* 22.9*  NEUTROABS 14.2*  --   --   HGB 12.1 11.6* 11.4*  HCT 39.1 37.2 34.9*  MCV 91.4 89.9 88.6  PLT 435* 320 950    Basic Metabolic Panel: Recent Labs  Lab 04/21/17 2245 04/23/17 0329  NA 140 138  K 4.0 3.8  CL 106 94*  CO2 26 32  GLUCOSE 110* 152*  BUN 20 23*  CREATININE 0.87 0.79  CALCIUM 8.8* 8.3*    GFR: Estimated Creatinine Clearance: 47.1 mL/min (by C-G formula based on SCr of 0.79 mg/dL).  Liver Function Tests: Recent Labs  Lab 04/21/17 2245  AST 25  ALT 16  ALKPHOS 72  BILITOT 0.8  PROT 6.6  ALBUMIN 3.3*     Radiology Studies: Ct Chest Wo Contrast  Result Date: 04/22/2017 CLINICAL DATA:  Evaluate pleural effusion. EXAM: CT CHEST WITHOUT CONTRAST TECHNIQUE: Multidetector CT imaging of the chest was performed following the standard protocol without IV contrast. COMPARISON:  Chest radiograph 04/21/2017.  Chest CT, 01/02/2017. FINDINGS: Cardiovascular: Heart is mildly enlarged. There are three-vessel dense coronary artery calcifications. Right anterior chest wall sequential pacemaker leads are well-positioned in the right atrium right ventricle. Aorta is normal in caliber. Atherosclerotic calcifications are noted along the aortic  arch and descending portions. Mild dilation of both pulmonary arteries, right measuring 3 cm and left 2.9 cm. Mediastinum/Nodes: No neck base or axillary masses or enlarged lymph nodes. There are several prominent to mildly enlarged mediastinal lymph nodes. There is a left para carinal node measuring 1 cm short axis. A subcarinal node measures 15 mm in short axis. No mediastinal or hilar masses. Trachea is widely patent. The esophagus is mildly distended but otherwise unremarkable.  Lungs/Pleura: Small left and minimal right pleural effusions. There is confluent opacity in the dependent lower lobes, left greater than right, which may all be atelectasis. A component of infection should be considered in the proper clinical setting. There is additional confluent opacity in the inferomedial left upper lobe, lingula, likely atelectasis. There also areas of peribronchovascular ground-glass opacity in the left upper lobe lingula and right lower lobe. An irregular area focal opacity is noted in the left lower lobe centered on image 103, series 7 measuring 2.4 x 1.0 cm transversely. Mild peripheral interstitial thickening is noted bilaterally most evident in the right upper lobe. No pneumothorax. Upper Abdomen: Multiple low-density liver masses consistent with cysts, stable. No acute findings. Musculoskeletal: Moderate compression fracture with associated sclerosis within the T8 vertebra, new from a thoracic spine CT dated 01/10/2017. Moderate compression fracture treated with vertebroplasty at T9, stable. No other fractures. No osteolytic lesions. IMPRESSION: 1. Small left and minimal right pleural effusions. 2. Areas of lung opacity as detailed above, with the dependent lower lobe opacity most likely atelectasis, and other areas of opacity consistent with multifocal pneumonia. There is mild interstitial thickening that appears chronic. No convincing pulmonary edema. 3. Cardiomegaly and coronary artery calcifications. 4. Aortic atherosclerosis. 5. New moderate compression fracture of T8 when compared to the thoracic spine CT dated 01/10/2017. This was present on a chest radiograph dated 04/02/2017. Aortic Atherosclerosis (ICD10-I70.0). Electronically Signed   By: Lajean Manes M.D.   On: 04/22/2017 12:29   Dg Chest Portable 1 View  Result Date: 04/21/2017 CLINICAL DATA:  Shortness of breath.  Recent diagnosis of pneumonia. EXAM: PORTABLE CHEST 1 VIEW COMPARISON:  Radiograph 04/02/2017.  Chest CTA  01/02/2017 FINDINGS: Right-sided pacemaker in place. Cardiomegaly with progression. Atherosclerosis of the aortic arch. Diffuse pulmonary edema. Left pleural effusion and basilar opacity, progressed from prior exam. Persistent nodular opacity in the left lung base. Small right pleural effusion and atelectasis also noted. No pneumothorax. IMPRESSION: CHF with increasing cardiomegaly, pulmonary edema, and bilateral pleural effusions, left greater than right. Left basilar opacities, some of which is nodular, increased from prior exam, may be infectious or inflammatory. No nodules were seen chest CTA 01/02/2017 or radiographs 03/18/2017, making neoplasm unlikely. Electronically Signed   By: Jeb Levering M.D.   On: 04/21/2017 23:11   Dg Swallowing Func-speech Pathology  Result Date: 04/22/2017 Objective Swallowing Evaluation: Type of Study: MBS-Modified Barium Swallow Study Patient Details Name: GLORENE LEITZKE MRN: [id number] Date of Birth: 15-Sep-1930 Today's Date: 04/22/2017 Time: SLP Start Time (ACUTE ONLY): 1225-SLP Stop Time (ACUTE ONLY): 1243 SLP Time Calculation (min) (ACUTE ONLY): 18 min Past Medical History: Past Medical History: Diagnosis Date . CHB (complete heart block) ([place]) 07/13/2013  Pacemaker dependent . CHF (congestive heart failure) ([place])  . COPD (chronic obstructive pulmonary disease) ([place])  . DM (dermatomyositis)  . Orthostatic hypotension  . Pacemaker 07/13/2013  Her original pacemaker and the current leads were implanted in 1992. She has had 2 generator change out, most recently in 2010. Her device is a [employer]er, retail  2110 non RF dual-chamber pacemaker with a battery longevity estimated at about 7 years. The atrial lead is a St. Jude [id number] and the ventricular lead was a Biotronik [id number].  [name] Paroxysmal atrial fibrillation ([place]) 09/04/2014  on Eliquis . Polymyalgia rheumatica ([place])  . Scoliosis  . SSS (sick sinus syndrome) ([place]) 07/13/2013 . Syncope  . Systemic hypertension  . Thoracic kyphosis   Past Surgical History: Past Surgical History: Procedure Laterality Date . IR KYPHO THORACIC WITH BONE BIOPSY  01/04/2017 . IR RADIOLOGIST EVAL & MGMT  01/10/2017 . NM MYOCAR PERF WALL MOTION  09/13/2011  Low risk . PERMANENT PACEMAKER GENERATOR CHANGE  03/27/2009  St.Jude . US ECHOCARDIOGRAPHY  03/28/2012  Mod LAE,mild MR,aortic sclerosis w/mod AI,mod. TR,mild PI,Stage I diastolic dysfunction . VIDEO BRONCHOSCOPY Bilateral 10/07/2016  Procedure: VIDEO BRONCHOSCOPY WITH FLUORO;  Surgeon: Marshell Garfinkel, MD;  Location: [facility];  Service: Cardiopulmonary;  Laterality: Bilateral; HPI: CarolWinslowis a 83 y.o.female, w sick sinus syndrome, complete hb, s/p pacer, diastolic CHF, Pafib, Copd, dermatomyostitis/PMR, scoliosis, dementia who apparently developed sob this past evening. Pt notes cough with yellow sputum. Pt with recent hospitalization 04/02/17 for PNA. Chest x ray 11/2 revealed CHF with increasing cardiomegaly, pulmonary edema, and bilateral No Data Recorded Assessment / Plan / Recommendation CHL IP CLINICAL IMPRESSIONS 04/22/2017 Clinical Impression Pt presents with a mild to moderate oropharyngeal dypsphagia with sensory and motor involvement. Oral phase characterized by delayed oral transit and premature spillage. Pt with consistent mistimed swallow initiation as well as decreased laryngeal vestibule closure resulting in frequent deep penetration before the swallow of thin and nectar thick liquids. Pentration amount increased with larger bolus quanities. Trace penetrates remained in the laryngeal vestibule at the level of the vocal cords post swallow. Cues for throat clear aided in clearance, though pt did not consistently sense penetration instances. No penetration noted with puree, honey thick, or regular consistencies. No aspiration visualized throughout the swallow study, however anticipate aross a meal pt is having trace aspiration based upon swallow deficits noted on MBS and pts hx including  recurrent PNA. Discussed in length swallow deficits with pt and pts daughter whom at this time prefer not to have diet modifications and instead focus on quality of life. A regular diet and thin liquids with swallow precautions was requested. Safe swallow strategies recommended include no straws, no mixed consistencies, and medicines whole in puree with full supervision with all PO. Pts daughter mentioned future palliative consult. ST to continue to monitor for needs and goals of care.  SLP Visit Diagnosis Dysphagia, oropharyngeal phase (R13.12) Attention and concentration deficit following -- Frontal lobe and executive function deficit following -- Impact on safety and function Moderate aspiration risk   CHL IP TREATMENT RECOMMENDATION 04/22/2017 Treatment Recommendations Therapy as outlined in treatment plan below   Prognosis 04/22/2017 Prognosis for Safe Diet Advancement Fair Barriers to Reach Goals Cognitive deficits Barriers/Prognosis Comment -- CHL IP DIET RECOMMENDATION 04/22/2017 SLP Diet Recommendations Regular solids;Thin liquid;No mixed consistencies Liquid Administration via Cup;No straw Medication Administration Whole meds with puree Compensations Small sips/bites;Slow rate;Minimize environmental distractions;Clear throat intermittently;Multiple dry swallows after each bite/sip Postural Changes Remain semi-upright after after feeds/meals (Comment);Seated upright at 90 degrees   CHL IP OTHER RECOMMENDATIONS 04/22/2017 Recommended Consults -- Oral Care Recommendations Oral care BID Other Recommendations --   CHL IP FOLLOW UP RECOMMENDATIONS 04/22/2017 Follow up Recommendations 24 hour supervision/assistance   CHL IP FREQUENCY AND DURATION 04/22/2017 Speech Therapy Frequency (ACUTE ONLY) min 2x/week Treatment Duration 1 week  CHL IP ORAL PHASE 04/22/2017 Oral Phase Impaired Oral - Pudding Teaspoon -- Oral - Pudding Cup -- Oral - Honey Teaspoon Delayed oral transit Oral - Honey Cup Delayed oral transit Oral -  Nectar Teaspoon Delayed oral transit;Premature spillage Oral - Nectar Cup Delayed oral transit;Premature spillage Oral - Nectar Straw -- Oral - Thin Teaspoon Delayed oral transit;Premature spillage Oral - Thin Cup Delayed oral transit;Premature spillage Oral - Thin Straw -- Oral - Puree Decreased bolus cohesion;Piecemeal swallowing Oral - Mech Soft -- Oral - Regular Piecemeal swallowing;Delayed oral transit Oral - Multi-Consistency -- Oral - Pill Delayed oral transit;Lingual pumping Oral Phase - Comment --  CHL IP PHARYNGEAL PHASE 04/22/2017 Pharyngeal Phase Impaired Pharyngeal- Pudding Teaspoon -- Pharyngeal -- Pharyngeal- Pudding Cup -- Pharyngeal -- Pharyngeal- Honey Teaspoon Delayed swallow initiation-vallecula;Pharyngeal residue - valleculae;Reduced tongue base retraction Pharyngeal Material does not enter airway Pharyngeal- Honey Cup Delayed swallow initiation-vallecula;Reduced tongue base retraction;Pharyngeal residue - valleculae Pharyngeal Material does not enter airway Pharyngeal- Nectar Teaspoon Reduced airway/laryngeal closure;Penetration/Aspiration before swallow Pharyngeal Material enters airway, CONTACTS cords and not ejected out Pharyngeal- Nectar Cup Reduced airway/laryngeal closure;Penetration/Aspiration before swallow Pharyngeal Material enters airway, CONTACTS cords and not ejected out Pharyngeal- Nectar Straw -- Pharyngeal -- Pharyngeal- Thin Teaspoon Reduced airway/laryngeal closure;Penetration/Aspiration before swallow Pharyngeal Material enters airway, remains ABOVE vocal cords and not ejected out Pharyngeal- Thin Cup Reduced airway/laryngeal closure;Penetration/Aspiration before swallow Pharyngeal Material enters airway, CONTACTS cords and not ejected out Pharyngeal- Thin Straw Penetration/Aspiration before swallow;Reduced airway/laryngeal closure Pharyngeal Material enters airway, CONTACTS cords and not ejected out Pharyngeal- Puree Delayed swallow initiation-vallecula;Reduced tongue base  retraction;Pharyngeal residue - valleculae Pharyngeal -- Pharyngeal- Mechanical Soft -- Pharyngeal -- Pharyngeal- Regular Delayed swallow initiation-vallecula;Reduced tongue base retraction;Pharyngeal residue - valleculae Pharyngeal -- Pharyngeal- Multi-consistency -- Pharyngeal -- Pharyngeal- Pill Reduced airway/laryngeal closure;Penetration/Aspiration before swallow Pharyngeal Material enters airway, CONTACTS cords and not ejected out Pharyngeal Comment --  CHL IP CERVICAL ESOPHAGEAL PHASE 04/22/2017 Cervical Esophageal Phase WFL Pudding Teaspoon -- Pudding Cup -- Honey Teaspoon -- Honey Cup -- Nectar Teaspoon -- Nectar Cup -- Nectar Straw -- Thin Teaspoon -- Thin Cup -- Thin Straw -- Puree -- Mechanical Soft -- Regular -- Multi-consistency -- Pill -- Cervical Esophageal Comment -- No flowsheet data found. Arvil Chaco MA, CCC-SLP Acute Care Speech Language Pathologist  Levi Aland 04/22/2017, 1:14 PM                Medications:  Scheduled: . albuterol  2.5 mg Nebulization TID  . apixaban  2.5 mg Oral BID  . Chlorhexidine Gluconate Cloth  6 each Topical Q0600  . docusate sodium  100 mg Oral Daily  . escitalopram  10 mg Oral Daily  . furosemide  40 mg Intravenous Daily  . mouth rinse  15 mL Mouth Rinse BID  . methylPREDNISolone (SOLU-MEDROL) injection  80 mg Intravenous Q8H  . metoprolol succinate  25 mg Oral Daily  . mirtazapine  7.5 mg Oral QHS  . mupirocin ointment  1 application Nasal BID  . sodium chloride flush  3 mL Intravenous Q12H   Continuous: . sodium chloride    . ceFEPime (MAXIPIME) IV 1 g (04/23/17 0617)  . vancomycin Stopped (04/22/17 2300)   ZOX:WRUEAV chloride, HYDROcodone-acetaminophen, ipratropium-albuterol, midodrine, nitroGLYCERIN, ondansetron, sodium chloride flush  Assessment/Plan:  Principal Problem:   HCAP (healthcare-associated pneumonia)    Acute hypoxic respiratory failure Multifactorial including COPD exacerbation, pulmonary edema or possible  pneumonia.  She initially required BiPAP. Has been off of BiPAP since yesterday.  Appears to  be stable.    Acute diastolic congestive heart failure Chest x-ray did show evidence for pulmonary edema.  She also has bilateral pleural effusions.  Continue IV Lasix was decreased the dose.  Continue to monitor strict ins and outs and daily weights.    Possible acute COPD exacerbation Seems to be stable.  Patient does have a history of COPD and has smoked extensively.  Continue steroids and nebulizer treatments.    Healthcare associated pneumonia Chest x-ray showed opacities which were concerning for an infectious process.  Patient recently completed a course of antibiotics.  She was recently hospitalized for similar presentation.  Has been empirically started on vancomycin and cefepime which can be continued for now.  There could be an element of aspiration as well.  Patient seen by speech therapy.  Underwent modified barium swallow.  Remains on regular diet with thin liquids.   Await blood culture reports.  Paroxysmal atrial fibrillation Stable. Continue Eliquis.  Continue beta-blocker.  Monitor on telemetry.  History of polymyalgia rheumatica She is on prednisone at home which is being held as she is on Solu-Medrol in the hospital.  History of sick sinus syndrome/heart block/pacemaker Stable.  Continue to monitor.  EKG reviewed  Goals of care Palliative medicine team was consulted as patient has had multiple hospitalizations this year especially in the last 2-3 months.  All of these have been for respiratory issues.  These issues were discussed with patient's daughter who feels that her mother is slowly declining.  Await goals of care conversation.    DVT Prophylaxis: Patient is on Eliquis    Code Status: DNR Family Communication: Discussed with the patient's daughter Disposition Plan: Management as outlined above.  Okay for transfer to floor today.   LOS: 2 days   [facility] Pager [phone] 04/23/2017, 8:37 AM  If 7PM-7AM, please contact night-coverage at [email or web address], password [facility]

## 2017-04-24 DIAGNOSIS — R0602 Shortness of breath: Secondary | ICD-10-CM

## 2017-04-24 DIAGNOSIS — R0600 Dyspnea, unspecified: Secondary | ICD-10-CM

## 2017-04-24 DIAGNOSIS — Z7189 Other specified counseling: Secondary | ICD-10-CM

## 2017-04-24 DIAGNOSIS — Z515 Encounter for palliative care: Secondary | ICD-10-CM

## 2017-04-24 DIAGNOSIS — I480 Paroxysmal atrial fibrillation: Secondary | ICD-10-CM

## 2017-04-24 LAB — BASIC METABOLIC PANEL
Anion gap: 10 (ref 5–15)
BUN: 29 mg/dL — ABNORMAL HIGH (ref 6–20)
CALCIUM: 8.8 mg/dL — AB (ref 8.9–10.3)
CO2: 31 mmol/L (ref 22–32)
Chloride: 100 mmol/L — ABNORMAL LOW (ref 101–111)
Creatinine, Ser: 0.87 mg/dL (ref 0.44–1.00)
GFR calc Af Amer: >60 mL/min (ref >60)
GFR, EST NON AFRICAN AMERICAN: 59 mL/min — AB (ref >60)
GLUCOSE: 157 mg/dL — AB (ref 65–99)
POTASSIUM: 3.7 mmol/L (ref 3.5–5.1)
Sodium: 141 mmol/L (ref 135–145)

## 2017-04-24 LAB — LEGIONELLA PNEUMOPHILA SEROGP 1 UR AG: L. PNEUMOPHILA SEROGP 1 UR AG: NEGATIVE

## 2017-04-24 MED ORDER — AMOXICILLIN-POT CLAVULANATE 875-125 MG PO TABS
1.0000 | ORAL_TABLET | Freq: Two times a day (BID) | ORAL | Status: DC
  Administered 2017-04-24 – 2017-04-25 (×3): 1 via ORAL
  Filled 2017-04-24 (×3): qty 1

## 2017-04-24 MED ORDER — POTASSIUM CHLORIDE CRYS ER 20 MEQ PO TBCR
20.0000 meq | EXTENDED_RELEASE_TABLET | Freq: Once | ORAL | Status: AC
  Administered 2017-04-24: 20 meq via ORAL
  Filled 2017-04-24: qty 1

## 2017-04-24 MED ORDER — FUROSEMIDE 40 MG PO TABS
40.0000 mg | ORAL_TABLET | Freq: Every day | ORAL | Status: DC
  Administered 2017-04-24 – 2017-04-25 (×2): 40 mg via ORAL
  Filled 2017-04-24 (×2): qty 1

## 2017-04-24 MED ORDER — PREDNISONE 20 MG PO TABS
60.0000 mg | ORAL_TABLET | Freq: Two times a day (BID) | ORAL | Status: DC
  Administered 2017-04-24 – 2017-04-25 (×2): 60 mg via ORAL
  Filled 2017-04-24 (×2): qty 3

## 2017-04-24 NOTE — Evaluation (Signed)
Occupational Therapy Evaluation Patient Details Name: [name] MRN: [id number] DOB: 1930-12-27 Today's Date: 04/24/2017    History of Present Illness 83 year old Caucasian female with a history of T 9 Comp fx s/p kyphoplasty 12/2016, sick sinus syndrome, complete heart block, pacemaker placement, diastolic CHF, paroxysmal atrial fibrillation on anticoagulation, COPD dementia who lives in assisted living facility and was brought into the hospital due to worsening respiratory status.  She was thought to have a combination of COPD exacerbation as well as diastolic CHF.   Clinical Impression   Pt admitted with COPD exacerbation Pt currently with functional limitations due to the deficits listed below (see OT Problem List). *Pt will benefit from skilled OT to increase their safety and independence with ADL and functional mobility for ADL to facilitate discharge to venue listed below.      Follow Up Recommendations  Home health OT    Equipment Recommendations  None recommended by OT       Precautions / Restrictions Precautions Precautions: Fall      Mobility Bed Mobility Overal bed mobility: Needs Assistance Bed Mobility: Supine to Sit     Supine to sit: Supervision     General bed mobility comments: increased time  Transfers Overall transfer level: Needs assistance Equipment used: Rolling walker (2 wheeled) Transfers: Sit to/from [facility] Sit to Stand: Min assist Stand pivot transfers: Min assist       General transfer comment: Assist to rise, stabilize, control descent. Stand pivot, bed to recliner, with rW.         ADL either performed or assessed with clinical judgement   ADL Overall ADL's : Needs assistance/impaired Eating/Feeding: Independent   Grooming: Min guard;Standing   Upper Body Bathing: Set up;Sitting   Lower Body Bathing: Minimal assistance;Sit to/from stand;Cueing for safety;Cueing for sequencing   Upper Body Dressing :  Set up;Sitting   Lower Body Dressing: Minimal assistance;Sit to/from stand;Cueing for safety;Cueing for sequencing   Toilet Transfer: Minimal assistance;Comfort height toilet   Toileting- Clothing Manipulation and Hygiene: Min guard;Sit to/from stand       Functional mobility during ADLs: Min guard;Cueing for safety;Cueing for sequencing       Vision Patient Visual Report: No change from baseline              Pertinent Vitals/Pain Pain Assessment: No/denies pain     Hand Dominance     Extremity/Trunk Assessment Upper Extremity Assessment Upper Extremity Assessment: Generalized weakness           Communication Communication Communication: No difficulties   Cognition Arousal/Alertness: Awake/alert Behavior During Therapy: WFL for tasks assessed/performed Overall Cognitive Status: Within Functional Limits for tasks assessed                                     General Comments       Exercises     Shoulder Instructions      Home Living Family/patient expects to be discharged to:: Assisted living(pt living there temporarily until she returns to baseline) Living Arrangements: Alone                           Home Equipment: Walker - 2 wheels;Cane - single point;Bedside commode   Additional Comments: daughter lives nearby      Prior Functioning/Environment Level of Independence: Independent with assistive device(s)        Comments: pt  is primarily using RW.  Pt states she is using as needed until she is "completely better"        OT Problem List: Decreased strength;Decreased activity tolerance;Cardiopulmonary status limiting activity;Impaired balance (sitting and/or standing)      OT Treatment/Interventions: Self-care/ADL training;Energy conservation;Patient/family education    OT Goals(Current goals can be found in the care plan section) Acute Rehab OT Goals Patient Stated Goal: Regain independence OT Goal Formulation:  With patient Time For Goal Achievement: 05/08/17  OT Frequency: Min 2X/week   Barriers to D/C:            Co-evaluation              AM-PAC PT "6 Clicks" Daily Activity     Outcome Measure Help from another person eating meals?: None Help from another person taking care of personal grooming?: A Little Help from another person toileting, which includes using toliet, bedpan, or urinal?: A Little Help from another person bathing (including washing, rinsing, drying)?: A Little Help from another person to put on and taking off regular upper body clothing?: None Help from another person to put on and taking off regular lower body clothing?: A Little 6 Click Score: 20   End of Session Nurse Communication: Mobility status  Activity Tolerance: Patient tolerated treatment well Patient left: in chair;with call bell/phone within reach;with family/visitor present(chair alarm batteries low. CNA to replace)  OT Visit Diagnosis: Unsteadiness on feet (R26.81);History of falling (Z91.81);Muscle weakness (generalized) (M62.81)                Time: 0109-3235 OT Time Calculation (min): 32 min Charges:  OT General Charges $OT Visit: 1 Visit OT Treatments $Self Care/Home Management : 8-22 mins G-Codes:     Kari Baars, OT [phone]  Payton Mccallum D 04/24/2017, 11:28 AM

## 2017-04-24 NOTE — Progress Notes (Signed)
PMT progress note  Patient is awake alert sitting up in chair She is not on supplemental O2 She is in no distress Daughter at bedside  BP (!) 150/88 (BP Location: Left Arm)   Pulse 73   Temp 98.2 F (36.8 C) (Oral)   Resp 16   Ht 5\' 5"  (1.651 m)   Wt 62.7 kg (138 lb 3.7 oz)   SpO2 95%   BMI 23.00 kg/m  Labs and imaging noted.   83 year old Caucasian female with a history of T 9 Comp fx s/p kyphoplasty 12/2016, sick sinus syndrome, complete heart block, pacemaker placement, diastolic CHF, paroxysmal atrial fibrillation on anticoagulation, COPD dementia who lives in assisted living facility and was brought into the hospital due to worsening respiratory status.  She was thought to have a combination of COPD exacerbation as well as diastolic CHF.  PLAN: DNR DNI.  [facility] with palliative care to follow, please mention this in the D/C summary so that [facility] staff can help facilitate addition of palliative services once discharged.   Patient still elects for ongoing hospitalizations, at least for now. She prefers not to talk about hospice philosophy of care, but is accepting of palliative services following her at [facility].   25 minutes spent.  Loistine Chance MD [phone]  Palliative Medicine Team.

## 2017-04-24 NOTE — Evaluation (Signed)
Physical Therapy Evaluation Patient Details Name: [name] MRN: [id number] DOB: 01/20/1931 Today's Date: 04/24/2017   History of Present Illness  83 year old Caucasian female with a history of T 9 Comp fx s/p kyphoplasty 12/2016, sick sinus syndrome, complete heart block, pacemaker placement, diastolic CHF, paroxysmal atrial fibrillation on anticoagulation, COPD dementia who lives in assisted living facility and was brought into the hospital due to worsening respiratory status.  She was thought to have a combination of COPD exacerbation as well as diastolic CHF.  Clinical Impression  Pt ambulated 180' with RW with no loss of balance, no dyspnea. She was independent with transfers. No further PT indicated as she is modified independent with mobility, which is her baseline. PT signing off.     Follow Up Recommendations No PT follow up    Equipment Recommendations  None recommended by PT    Recommendations for Other Services       Precautions / Restrictions Precautions Precautions: Fall Precaution Comments: mild memory loss Restrictions Weight Bearing Restrictions: No      Mobility  Bed Mobility Overal bed mobility: Needs Assistance Bed Mobility: Supine to Sit     Supine to sit: Supervision     General bed mobility comments: up in chair  Transfers Overall transfer level: Needs assistance Equipment used: Rolling walker (2 wheeled) Transfers: Sit to/from Stand Sit to Stand: Modified independent (Device/Increase time) Stand pivot transfers: Min assist       General transfer comment: Assist to rise, stabilize, control descent. Stand pivot, bed to recliner, with rW.   Ambulation/Gait Ambulation/Gait assistance: Modified independent (Device/Increase time) Ambulation Distance (Feet): 180 Feet Assistive device: Rolling walker (2 wheeled) Gait Pattern/deviations: WFL(Within Functional Limits)   Gait velocity interpretation: at or above normal speed for  age/gender General Gait Details: steady with walking, no LOB, no dyspnea while walking, poor wavelength on pulse ox while walking so no reliable reading available, but SaO2 on RA after 1 min seated rest was 94%  Stairs            Wheelchair Mobility    Modified Rankin (Stroke Patients Only)       Balance Overall balance assessment: Modified Independent;History of Falls(1 fall in past 1 year)         Standing balance support: Bilateral upper extremity supported Standing balance-Leahy Scale: Fair                               Pertinent Vitals/Pain Pain Assessment: No/denies pain    Home Living Family/patient expects to be discharged to:: Assisted living(pt living there temporarily until she returns to baseline) Living Arrangements: Alone             Home Equipment: Walker - 2 wheels;Cane - single point;Bedside commode Additional Comments: daughter lives nearby    Prior Function Level of Independence: Independent with assistive device(s)         Comments: walks with RW or SPC to dining room, has supervision for shower transfers but does bathing/dressing herself     Hand Dominance        Extremity/Trunk Assessment   Upper Extremity Assessment Upper Extremity Assessment: Defer to OT evaluation    Lower Extremity Assessment Lower Extremity Assessment: Overall WFL for tasks assessed    Cervical / Trunk Assessment Cervical / Trunk Assessment: Kyphotic  Communication   Communication: No difficulties  Cognition Arousal/Alertness: Awake/alert Behavior During Therapy: WFL for tasks assessed/performed Overall Cognitive Status: Within Functional  Limits for tasks assessed                                        General Comments      Exercises     Assessment/Plan    PT Assessment Patent does not need any further PT services  PT Problem List         PT Treatment Interventions      PT Goals (Current goals can be found  in the Care Plan section)  Acute Rehab PT Goals Patient Stated Goal: likes to go to bingo at ALF PT Goal Formulation: All assessment and education complete, DC therapy    Frequency     Barriers to discharge        Co-evaluation               AM-PAC PT "6 Clicks" Daily Activity  Outcome Measure Difficulty turning over in bed (including adjusting bedclothes, sheets and blankets)?: None Difficulty moving from lying on back to sitting on the side of the bed? : None Difficulty sitting down on and standing up from a chair with arms (e.g., wheelchair, bedside commode, etc,.)?: A Little Help needed moving to and from a bed to chair (including a wheelchair)?: None Help needed walking in hospital room?: None Help needed climbing 3-5 steps with a railing? : A Little 6 Click Score: 22    End of Session Equipment Utilized During Treatment: Gait belt Activity Tolerance: Patient tolerated treatment well Patient left: in chair;with call bell/phone within reach;with chair alarm set;with family/visitor present Nurse Communication: Mobility status      Time: 1211-1228 PT Time Calculation (min) (ACUTE ONLY): 17 min   Charges:   PT Evaluation $PT Eval Low Complexity: 1 Low     PT G Codes:         [name] 04/24/2017, 12:54 PM [phone]

## 2017-04-24 NOTE — Progress Notes (Signed)
[facility] PROGRESS NOTE  [name] ZHG:[id number] DOB: 09-Dec-1930 DOA: 04/21/2017  PCP: Lajean Manes, MD  Brief History/Interval Summary: 83 year old Caucasian female with a history of sick sinus syndrome, complete heart block, pacemaker placement, diastolic CHF, paroxysmal atrial fibrillation on anticoagulation, COPD dementia who lives in assisted living facility and was brought into the hospital due to worsening respiratory status.  She was thought to have a combination of COPD exacerbation as well as diastolic CHF.  She was hospitalized for further management.  Reason for Visit: Acute respiratory failure with hypoxia.  Consultants: Palliative medicine  Procedures: None  Antibiotics: Vancomycin and cefepime.  Will be changed over to Augmentin today.  Subjective/Interval History: Patient states that she is feeling better.  Denies any chest pain.  Cough is improved.  Denies any shortness of breath.    ROS: No nausea or vomiting  Objective:  Vital Signs  Vitals:   04/23/17 1523 04/23/17 2027 04/23/17 2100 04/24/17 0442  BP:   138/83 (!) 150/88  Pulse:    73  Resp:   16 16  Temp:   98.6 F (37 C) 98.2 F (36.8 C)  TempSrc:   Oral Oral  SpO2: 92% 92% 94% 94%  Weight:      Height:        Intake/Output Summary (Last 24 hours) at 04/24/2017 0811 Last data filed at 04/23/2017 1700 Gross per 24 hour  Intake 480 ml  Output 1300 ml  Net -820 ml   Filed Weights   04/22/17 0303 04/22/17 0910  Weight: 63 kg (139 lb) 62.7 kg (138 lb 3.7 oz)    General appearance: Awake alert.  In no distress Resp: Improved air entry bilaterally.  Less crackles today compared to yesterday.  No wheezing.  Normal effort at rest.   Cardio: S1-S2 is normal regular.  No S3-S4.  No rubs murmurs or bruit.  No pedal edema GI: Abdomen is soft.  Nontender nondistended. Extremities: Minimal edema bilateral lower extremities Pulses: 2+ and symmetric Neurologic: Disoriented.  No focal  neurological deficits.  Lab Results:  Data Reviewed: I have personally reviewed following labs and imaging studies  CBC: Recent Labs  Lab 04/21/17 2245 04/22/17 0927 04/23/17 0329  WBC 19.0* 22.8* 22.9*  NEUTROABS 14.2*  --   --   HGB 12.1 11.6* 11.4*  HCT 39.1 37.2 34.9*  MCV 91.4 89.9 88.6  PLT 435* 320 196    Basic Metabolic Panel: Recent Labs  Lab 04/21/17 2245 04/23/17 0329 04/24/17 0712  NA 140 138 141  K 4.0 3.8 3.7  CL 106 94* 100*  CO2 26 32 31  GLUCOSE 110* 152* 157*  BUN 20 23* 29*  CREATININE 0.87 0.79 0.87  CALCIUM 8.8* 8.3* 8.8*    GFR: Estimated Creatinine Clearance: 43.3 mL/min (by C-G formula based on SCr of 0.87 mg/dL).  Liver Function Tests: Recent Labs  Lab 04/21/17 2245  AST 25  ALT 16  ALKPHOS 72  BILITOT 0.8  PROT 6.6  ALBUMIN 3.3*     Radiology Studies: Ct Chest Wo Contrast  Result Date: 04/22/2017 CLINICAL DATA:  Evaluate pleural effusion. EXAM: CT CHEST WITHOUT CONTRAST TECHNIQUE: Multidetector CT imaging of the chest was performed following the standard protocol without IV contrast. COMPARISON:  Chest radiograph 04/21/2017.  Chest CT, 01/02/2017. FINDINGS: Cardiovascular: Heart is mildly enlarged. There are three-vessel dense coronary artery calcifications. Right anterior chest wall sequential pacemaker leads are well-positioned in the right atrium right ventricle. Aorta is normal in caliber. Atherosclerotic calcifications  are noted along the aortic arch and descending portions. Mild dilation of both pulmonary arteries, right measuring 3 cm and left 2.9 cm. Mediastinum/Nodes: No neck base or axillary masses or enlarged lymph nodes. There are several prominent to mildly enlarged mediastinal lymph nodes. There is a left para carinal node measuring 1 cm short axis. A subcarinal node measures 15 mm in short axis. No mediastinal or hilar masses. Trachea is widely patent. The esophagus is mildly distended but otherwise unremarkable.  Lungs/Pleura: Small left and minimal right pleural effusions. There is confluent opacity in the dependent lower lobes, left greater than right, which may all be atelectasis. A component of infection should be considered in the proper clinical setting. There is additional confluent opacity in the inferomedial left upper lobe, lingula, likely atelectasis. There also areas of peribronchovascular ground-glass opacity in the left upper lobe lingula and right lower lobe. An irregular area focal opacity is noted in the left lower lobe centered on image 103, series 7 measuring 2.4 x 1.0 cm transversely. Mild peripheral interstitial thickening is noted bilaterally most evident in the right upper lobe. No pneumothorax. Upper Abdomen: Multiple low-density liver masses consistent with cysts, stable. No acute findings. Musculoskeletal: Moderate compression fracture with associated sclerosis within the T8 vertebra, new from a thoracic spine CT dated 01/10/2017. Moderate compression fracture treated with vertebroplasty at T9, stable. No other fractures. No osteolytic lesions. IMPRESSION: 1. Small left and minimal right pleural effusions. 2. Areas of lung opacity as detailed above, with the dependent lower lobe opacity most likely atelectasis, and other areas of opacity consistent with multifocal pneumonia. There is mild interstitial thickening that appears chronic. No convincing pulmonary edema. 3. Cardiomegaly and coronary artery calcifications. 4. Aortic atherosclerosis. 5. New moderate compression fracture of T8 when compared to the thoracic spine CT dated 01/10/2017. This was present on a chest radiograph dated 04/02/2017. Aortic Atherosclerosis (ICD10-I70.0). Electronically Signed   By: Lajean Manes M.D.   On: 04/22/2017 12:29   Dg Swallowing Func-speech Pathology  Result Date: 04/22/2017 Objective Swallowing Evaluation: Type of Study: MBS-Modified Barium Swallow Study Patient Details Name: [name] MRN: [id number]  Date of Birth: [birth year] Today's Date: 04/22/2017 Time: SLP Start Time (ACUTE ONLY): 1225-SLP Stop Time (ACUTE ONLY): 1243 SLP Time Calculation (min) (ACUTE ONLY): 18 min Past Medical History: Past Medical History: Diagnosis Date . CHB (complete heart block) ([place]) 07/13/2013  Pacemaker dependent . CHF (congestive heart failure) ([place])  . COPD (chronic obstructive pulmonary disease) ([place])  . DM (dermatomyositis)  . Orthostatic hypotension  . Pacemaker 07/13/2013  Her original pacemaker and the current leads were implanted in 1992. She has had 2 generator change out, most recently in 2010. Her device is a Buyer, retail 2110 non RF dual-chamber pacemaker with a battery longevity estimated at about 7 years. The atrial lead is a St. Jude [id number] and the ventricular lead was a Biotronik [id number].  [name] Paroxysmal atrial fibrillation ([place]) 09/04/2014  on Eliquis . Polymyalgia rheumatica ([place])  . Scoliosis  . SSS (sick sinus syndrome) ([place]) 07/13/2013 . Syncope  . Systemic hypertension  . Thoracic kyphosis  Past Surgical History: Past Surgical History: Procedure Laterality Date . IR KYPHO THORACIC WITH BONE BIOPSY  01/04/2017 . IR RADIOLOGIST EVAL & MGMT  01/10/2017 . NM MYOCAR PERF WALL MOTION  09/13/2011  Low risk . PERMANENT PACEMAKER GENERATOR CHANGE  03/27/2009  [employer].Jude . US ECHOCARDIOGRAPHY  03/28/2012  Mod LAE,mild MR,aortic sclerosis w/mod AI,mod. TR,mild PI,Stage I diastolic dysfunction . VIDEO BRONCHOSCOPY Bilateral  10/07/2016  Procedure: VIDEO BRONCHOSCOPY WITH FLUORO;  Surgeon: Marshell Garfinkel, MD;  Location: [facility];  Service: Cardiopulmonary;  Laterality: Bilateral; HPI: CarolWinslowis a 83 y.o.female, w sick sinus syndrome, complete hb, s/p pacer, diastolic CHF, Pafib, Copd, dermatomyostitis/PMR, scoliosis, dementia who apparently developed sob this past evening. Pt notes cough with yellow sputum. Pt with recent hospitalization 04/02/17 for PNA. Chest x ray 11/2 revealed CHF with increasing cardiomegaly, pulmonary  edema, and bilateral No Data Recorded Assessment / Plan / Recommendation CHL IP CLINICAL IMPRESSIONS 04/22/2017 Clinical Impression Pt presents with a mild to moderate oropharyngeal dypsphagia with sensory and motor involvement. Oral phase characterized by delayed oral transit and premature spillage. Pt with consistent mistimed swallow initiation as well as decreased laryngeal vestibule closure resulting in frequent deep penetration before the swallow of thin and nectar thick liquids. Pentration amount increased with larger bolus quanities. Trace penetrates remained in the laryngeal vestibule at the level of the vocal cords post swallow. Cues for throat clear aided in clearance, though pt did not consistently sense penetration instances. No penetration noted with puree, honey thick, or regular consistencies. No aspiration visualized throughout the swallow study, however anticipate aross a meal pt is having trace aspiration based upon swallow deficits noted on MBS and pts hx including recurrent PNA. Discussed in length swallow deficits with pt and pts daughter whom at this time prefer not to have diet modifications and instead focus on quality of life. A regular diet and thin liquids with swallow precautions was requested. Safe swallow strategies recommended include no straws, no mixed consistencies, and medicines whole in puree with full supervision with all PO. Pts daughter mentioned future palliative consult. ST to continue to monitor for needs and goals of care.  SLP Visit Diagnosis Dysphagia, oropharyngeal phase (R13.12) Attention and concentration deficit following -- Frontal lobe and executive function deficit following -- Impact on safety and function Moderate aspiration risk   CHL IP TREATMENT RECOMMENDATION 04/22/2017 Treatment Recommendations Therapy as outlined in treatment plan below   Prognosis 04/22/2017 Prognosis for Safe Diet Advancement Fair Barriers to Reach Goals Cognitive deficits Barriers/Prognosis  Comment -- CHL IP DIET RECOMMENDATION 04/22/2017 SLP Diet Recommendations Regular solids;Thin liquid;No mixed consistencies Liquid Administration via Cup;No straw Medication Administration Whole meds with puree Compensations Small sips/bites;Slow rate;Minimize environmental distractions;Clear throat intermittently;Multiple dry swallows after each bite/sip Postural Changes Remain semi-upright after after feeds/meals (Comment);Seated upright at 90 degrees   CHL IP OTHER RECOMMENDATIONS 04/22/2017 Recommended Consults -- Oral Care Recommendations Oral care BID Other Recommendations --   CHL IP FOLLOW UP RECOMMENDATIONS 04/22/2017 Follow up Recommendations 24 hour supervision/assistance   CHL IP FREQUENCY AND DURATION 04/22/2017 Speech Therapy Frequency (ACUTE ONLY) min 2x/week Treatment Duration 1 week      CHL IP ORAL PHASE 04/22/2017 Oral Phase Impaired Oral - Pudding Teaspoon -- Oral - Pudding Cup -- Oral - Honey Teaspoon Delayed oral transit Oral - Honey Cup Delayed oral transit Oral - Nectar Teaspoon Delayed oral transit;Premature spillage Oral - Nectar Cup Delayed oral transit;Premature spillage Oral - Nectar Straw -- Oral - Thin Teaspoon Delayed oral transit;Premature spillage Oral - Thin Cup Delayed oral transit;Premature spillage Oral - Thin Straw -- Oral - Puree Decreased bolus cohesion;Piecemeal swallowing Oral - Mech Soft -- Oral - Regular Piecemeal swallowing;Delayed oral transit Oral - Multi-Consistency -- Oral - Pill Delayed oral transit;Lingual pumping Oral Phase - Comment --  CHL IP PHARYNGEAL PHASE 04/22/2017 Pharyngeal Phase Impaired Pharyngeal- Pudding Teaspoon -- Pharyngeal -- Pharyngeal- Pudding Cup -- Pharyngeal -- Pharyngeal- Honey  Teaspoon Delayed swallow initiation-vallecula;Pharyngeal residue - valleculae;Reduced tongue base retraction Pharyngeal Material does not enter airway Pharyngeal- Honey Cup Delayed swallow initiation-vallecula;Reduced tongue base retraction;Pharyngeal residue - valleculae  Pharyngeal Material does not enter airway Pharyngeal- Nectar Teaspoon Reduced airway/laryngeal closure;Penetration/Aspiration before swallow Pharyngeal Material enters airway, CONTACTS cords and not ejected out Pharyngeal- Nectar Cup Reduced airway/laryngeal closure;Penetration/Aspiration before swallow Pharyngeal Material enters airway, CONTACTS cords and not ejected out Pharyngeal- Nectar Straw -- Pharyngeal -- Pharyngeal- Thin Teaspoon Reduced airway/laryngeal closure;Penetration/Aspiration before swallow Pharyngeal Material enters airway, remains ABOVE vocal cords and not ejected out Pharyngeal- Thin Cup Reduced airway/laryngeal closure;Penetration/Aspiration before swallow Pharyngeal Material enters airway, CONTACTS cords and not ejected out Pharyngeal- Thin Straw Penetration/Aspiration before swallow;Reduced airway/laryngeal closure Pharyngeal Material enters airway, CONTACTS cords and not ejected out Pharyngeal- Puree Delayed swallow initiation-vallecula;Reduced tongue base retraction;Pharyngeal residue - valleculae Pharyngeal -- Pharyngeal- Mechanical Soft -- Pharyngeal -- Pharyngeal- Regular Delayed swallow initiation-vallecula;Reduced tongue base retraction;Pharyngeal residue - valleculae Pharyngeal -- Pharyngeal- Multi-consistency -- Pharyngeal -- Pharyngeal- Pill Reduced airway/laryngeal closure;Penetration/Aspiration before swallow Pharyngeal Material enters airway, CONTACTS cords and not ejected out Pharyngeal Comment --  CHL IP CERVICAL ESOPHAGEAL PHASE 04/22/2017 Cervical Esophageal Phase WFL Pudding Teaspoon -- Pudding Cup -- Honey Teaspoon -- Honey Cup -- Nectar Teaspoon -- Nectar Cup -- Nectar Straw -- Thin Teaspoon -- Thin Cup -- Thin Straw -- Puree -- Mechanical Soft -- Regular -- Multi-consistency -- Pill -- Cervical Esophageal Comment -- No flowsheet data found. Arvil Chaco MA, CCC-SLP Acute Care Speech Language Pathologist  Levi Aland 04/22/2017, 1:14 PM                Medications:   Scheduled: . albuterol  2.5 mg Nebulization TID  . apixaban  2.5 mg Oral BID  . Chlorhexidine Gluconate Cloth  6 each Topical Q0600  . docusate sodium  100 mg Oral Daily  . escitalopram  10 mg Oral Daily  . furosemide  40 mg Intravenous Daily  . mouth rinse  15 mL Mouth Rinse BID  . methylPREDNISolone (SOLU-MEDROL) injection  80 mg Intravenous Q8H  . metoprolol succinate  25 mg Oral Daily  . mirtazapine  7.5 mg Oral QHS  . mupirocin ointment  1 application Nasal BID  . sodium chloride flush  3 mL Intravenous Q12H   Continuous: . sodium chloride    . ceFEPime (MAXIPIME) IV 1 g (04/24/17 0558)  . vancomycin Stopped (04/23/17 2306)   FMB:WGYKZL chloride, HYDROcodone-acetaminophen, ipratropium-albuterol, midodrine, nitroGLYCERIN, ondansetron, sodium chloride flush  Assessment/Plan:  Principal Problem:   HCAP (healthcare-associated pneumonia)    Acute hypoxic respiratory failure Multifactorial including COPD exacerbation, pulmonary edema or possible pneumonia.  She initially required BiPAP. She was successfully taken off of BiPAP.  Oxygen saturations are normal at rest.  Tends to desaturate with exertion.    Acute diastolic congestive heart failure Chest x-ray did show evidence for pulmonary edema.  She also has bilateral pleural effusions.  Patient was given IV Lasix.  She has diuresed well.  Her weight has decreased.  Change to oral Lasix today. Continue to monitor strict ins and outs and daily weights.    Possible acute COPD exacerbation Seems to be stable.  Patient does have a history of COPD and has smoked extensively.  Continue steroids and nebulizer treatments.  Leukocytosis is due to steroids.  Change to oral prednisone and start tapering.  Healthcare associated pneumonia Chest x-ray showed opacities which were concerning for an infectious process.  Patient recently completed a course of antibiotics (doxycycline).  She was recently hospitalized for similar presentation.   She was empirically started on vancomycin and cefepime which can be continued for now.  There could be an element of aspiration as well.  Patient seen by speech therapy.  Underwent modified barium swallow.  Remains on regular diet with thin liquids.   Cultures negative so far.  Change over to oral antibiotics.  We will initiate Augmentin.  Paroxysmal atrial fibrillation Stable. Continue Eliquis.  Continue beta-blocker.  Monitor on telemetry.  History of polymyalgia rheumatica She is on prednisone at home.  History of sick sinus syndrome/heart block/pacemaker Stable.  Continue to monitor.  EKG reviewed  Goals of care Palliative medicine team was consulted as patient has had multiple hospitalizations this year especially in the last 2-3 months.  All of these have been for respiratory issues.  These issues were discussed with patient's daughter who feels that her mother is slowly declining.  Patient seen by palliative medicine.  Palliative medicine to continue to follow at her assisted living facility.  DVT Prophylaxis: Patient is on Eliquis    Code Status: DNR Family Communication: Discussed with the patient's daughter Disposition Plan: Mobilize.  Anticipate discharge back to her assisted living facility tomorrow.   LOS: 3 days   [facility] Pager [phone] 04/24/2017, 8:11 AM  If 7PM-7AM, please contact night-coverage at [email or web address], password [facility]

## 2017-04-25 ENCOUNTER — Encounter: Payer: Self-pay | Admitting: Pulmonary Disease

## 2017-04-25 LAB — BASIC METABOLIC PANEL
Anion gap: 11 (ref 5–15)
BUN: 30 mg/dL — AB (ref 6–20)
CHLORIDE: 101 mmol/L (ref 101–111)
CO2: 31 mmol/L (ref 22–32)
CREATININE: 0.75 mg/dL (ref 0.44–1.00)
Calcium: 8.9 mg/dL (ref 8.9–10.3)
GFR calc Af Amer: >60 mL/min (ref >60)
GLUCOSE: 136 mg/dL — AB (ref 65–99)
Potassium: 3.9 mmol/L (ref 3.5–5.1)
SODIUM: 143 mmol/L (ref 135–145)

## 2017-04-25 MED ORDER — POTASSIUM CHLORIDE CRYS ER 20 MEQ PO TBCR: 20.0000 meq | EXTENDED_RELEASE_TABLET | Freq: Every day | ORAL | 0 refills | Status: DC

## 2017-04-25 MED ORDER — DOCUSATE SODIUM 100 MG PO CAPS: 100.0000 mg | ORAL_CAPSULE | Freq: Every day | ORAL | 0 refills | Status: DC | PRN

## 2017-04-25 MED ORDER — FUROSEMIDE 40 MG PO TABS: 20.0000 mg | ORAL_TABLET | Freq: Every day | ORAL | 1 refills | Status: DC

## 2017-04-25 MED ORDER — HYDROCODONE-ACETAMINOPHEN 5-325 MG PO TABS: 1.0000 | ORAL_TABLET | ORAL | 0 refills | Status: DC | PRN

## 2017-04-25 MED ORDER — ALPRAZOLAM 0.25 MG PO TABS: 0.2500 mg | ORAL_TABLET | Freq: Every evening | ORAL | 0 refills | Status: DC | PRN

## 2017-04-25 MED ORDER — ALBUTEROL SULFATE (2.5 MG/3ML) 0.083% IN NEBU
2.5000 mg | INHALATION_SOLUTION | Freq: Two times a day (BID) | RESPIRATORY_TRACT | Status: DC
  Administered 2017-04-25: 2.5 mg via RESPIRATORY_TRACT
  Filled 2017-04-25: qty 3

## 2017-04-25 MED ORDER — PREDNISONE 10 MG PO TABS: ORAL_TABLET | ORAL | 0 refills | Status: DC

## 2017-04-25 MED ORDER — AMOXICILLIN-POT CLAVULANATE 875-125 MG PO TABS: 1.0000 | ORAL_TABLET | Freq: Two times a day (BID) | ORAL | 0 refills | Status: AC

## 2017-04-25 NOTE — Care Management Important Message (Addendum)
Important Message  Patient Details IM Letter given to Nora/Case Manager to present to the Patient Name: NAKIESHA RUMSEY MRN: [id number] Date of Birth: [birth year]   Medicare Important Message Given:  Yes    Kerin Salen 04/25/2017, 11:35 AMImportant Message  Patient Details  Name: ADILENE AREOLA MRN: [id number] Date of Birth: Apr 23, 1931   Medicare Important Message Given:  Yes    Kerin Salen 04/25/2017, 11:35 AM

## 2017-04-25 NOTE — Progress Notes (Signed)
Both PT/OT evals were completed and no follow up was recommended. Pt to DC back to ALF. Marney Doctor RN,BSN,NCM [phone]

## 2017-04-25 NOTE — Consult Note (Signed)
   [facility] CM Inpatient Consult   04/25/2017  SHIFA BRISBON [birth year] [phone]    Screened for potential [facility] Care Management services due to multiple hospitalizations.  Chart reviewed. Noted [name] is supposed to discharge back to ALF with palliative follow up.   No [facility] Care Management needs identified at this time.    Marthenia Rolling, MSN-Ed, RN,BSN [facility] Liaison [phone]

## 2017-04-25 NOTE — Progress Notes (Signed)
Provided DC information to [place]. Awaiting review and once ALF advises documents received and facility prepared for pt's return, will facilitate transfer.   Sharren Bridge, MSW, LCSW Clinical Social Work 04/25/2017 [phone]

## 2017-04-25 NOTE — Clinical Social Work Note (Signed)
Clinical Social Work Assessment  Patient Details  Name: [name] MRN: [id number] Date of Birth: 12/18/1930  Date of referral:  04/25/17               Reason for consult:  (admitted from facility)                Permission sought to share information with:  Family Supports Permission granted to share information::  Yes, Verbal Permission Granted  Name::     son-in -law Annitta [place], Daughter Jewett::     Relationship::     Contact Information:     Housing/Transportation Living arrangements for the past 2 months:  Charity fundraiser of Information:  Patient Patient Interpreter Needed:  None Criminal Activity/Legal Involvement Pertinent to Current Situation/Hospitalization:  No - Comment as needed Significant Relationships:  Adult Children Lives with:  Facility Resident Do you feel safe going back to the place where you live?  Yes Need for family participation in patient care:  No (Coment)  Care giving concerns:  Pt from [facility]. Has resided there for "a couple of months. Last time I was discharged from the hospital I needed home health and we felt more comfortable with finding an assisted living for me temporarily so I could have a nurse and PT without having them come in my home." Pt states she is pleased with care at ALF, states her eventual goal is to return to her private residence. Reports she has a strong support system daughter and son-in-law.   Social Worker assessment / plan:  CSW consulted as pt is admitted from facility- [facility]. Plans to return at DC. Pt states her son-in-law will transport her to ALF at [place] left voicemail for facility and will complete FL2 at DC.   Employment status:  Retired Forensic scientist:  Medicare PT Recommendations:  No Follow Up Information / Referral to community resources:     Patient/Family's Response to care:  Appreciative of care  Patient/Family's Understanding of and Emotional  Response to Diagnosis, Current Treatment, and Prognosis:  Pt in good spirits- reports she is happy to be planning for DC. Demonstrates adequate understanding of her treatment and plan.  Emotional Assessment Appearance:  Appears stated age Attitude/Demeanor/Rapport:  (pleasant) Affect (typically observed):  Calm, Happy Orientation:  Oriented to Self, Oriented to Place, Oriented to  Time, Oriented to Situation Alcohol / Substance use:  Never Used Psych involvement (Current and /or in the community):  No (Comment)  Discharge Needs  Concerns to be addressed:  No discharge needs identified Readmission within the last 30 days:  No Current discharge risk:  None Barriers to Discharge:  No Barriers Identified   Nila Nephew, LCSW 04/25/2017, 9:40 AM  [phone]

## 2017-04-25 NOTE — Telephone Encounter (Signed)
PA for Trelegy has been denied.  PM - please advise. Thanks.

## 2017-04-25 NOTE — NC FL2 (Signed)
[place] LEVEL OF CARE SCREENING TOOL     IDENTIFICATION  Patient Name: [name] Birthdate: [birth year] Sex: female Admission Date (Current Location): 04/21/2017  [facility] and [place] Number:  Herbalist and Address:  [facility]  [place] [address] [place]      Provider Number: [id number]  Attending Physician Name and Address:  Bonnielee Haff, MD  Relative Name and Phone Number:       Current Level of Care: Hospital Recommended Level of Care: [place] Prior Approval Number:    Date Approved/Denied:   PASRR Number:    Discharge Plan: Other (Comment)(ALF)    Current Diagnoses: Patient Active Problem List   Diagnosis Date Noted  . Encounter for palliative care   . Goals of care, counseling/discussion   . Shortness of breath   . Stress fracture of thoracic vertebra 03/19/2017  . Dementia 03/19/2017  . Non-traumatic compression fracture of T9 thoracic vertebra ([place]) 01/02/2017  . Polymyalgia rheumatica ([place])   . Postural dizziness with near syncope   . Syncope 10/11/2016  . Postural dizziness with presyncope 10/11/2016  . Acute hypoxemic respiratory failure ([place]) 10/05/2016  . Chronic diastolic CHF (congestive heart failure) ([place]) 10/05/2016  . Smoker 10/05/2016  . Closed multiple fractures of right upper extremity with ribs with routine healing 10/05/2016  . HCAP (healthcare-associated pneumonia) 10/31/2015  . COPD (chronic obstructive pulmonary disease) ([place]) 10/31/2015  . Leukocytosis 10/31/2015  . Benign essential HTN 10/31/2015  . Anxiety state   . COPD exacerbation ([place]) 08/26/2015  . Systemic hypertension 08/26/2015  . Hypokalemia 08/26/2015  . Anticoagulant long-term use 08/26/2015  . DM (dermatomyositis) 08/26/2015  . Acute on chronic diastolic CHF (congestive heart failure), NYHA class 1 ([place])   . Sepsis ([place]) 06/07/2015  . CAP (community acquired pneumonia) 06/07/2015  . Acute CHF  (congestive heart failure) ([place]) 06/07/2015  . Paroxysmal atrial fibrillation ([place]) 09/04/2014  . Pacemaker 07/13/2013  . CHB (complete heart block) ([place]) 07/13/2013  . SSS (sick sinus syndrome) ([place]) 07/13/2013  . Orthostatic hypotension 07/13/2013  . Aortic insufficiency 07/13/2013    Orientation RESPIRATION BLADDER Height & Weight     Self, Time, Situation, Place  Normal Incontinent Weight: 138 lb 3.7 oz (62.7 kg) Height:  5\' 5"  (165.1 cm)  BEHAVIORAL SYMPTOMS/MOOD NEUROLOGICAL BOWEL NUTRITION STATUS      Continent Diet(heart healthy diet)  AMBULATORY STATUS COMMUNICATION OF NEEDS Skin   Independent(with use of rolling walker) Verbally Normal                       Personal Care Assistance Level of Assistance  Bathing, Feeding, Dressing Bathing Assistance: Limited assistance Feeding assistance: Independent Dressing Assistance: Limited assistance     Functional Limitations Info  Sight, Hearing, Speech Sight Info: Adequate Hearing Info: Adequate Speech Info: Adequate    SPECIAL CARE FACTORS FREQUENCY  PT (By licensed PT), OT (By licensed OT)     PT Frequency: 3x OT Frequency: 3x            Contractures Contractures Info: Not present    Additional Factors Info  Code Status, Allergies, Isolation Precautions Code Status Info: DNR Allergies Info: nka     Isolation Precautions Info: contact precautions MRSA     Current Medications (04/25/2017):  This is the current hospital active medication list Current Facility-Administered Medications  Medication Dose Route Frequency Provider Last Rate Last Dose  . 0.9 %  sodium chloride infusion  250  mL Intravenous PRN Jani Gravel, MD      . albuterol (PROVENTIL) (2.5 MG/3ML) 0.083% nebulizer solution 2.5 mg  2.5 mg Nebulization BID Bonnielee Haff, MD   2.5 mg at 04/25/17 0745  . amoxicillin-clavulanate (AUGMENTIN) 875-125 MG per tablet 1 tablet  1 tablet Oral Q12H Bonnielee Haff, MD   1 tablet at 04/25/17 1043  .  apixaban (ELIQUIS) tablet 2.5 mg  2.5 mg Oral BID Jani Gravel, MD   2.5 mg at 04/25/17 1043  . Chlorhexidine Gluconate Cloth 2 % PADS 6 each  6 each Topical Q0600 Jani Gravel, MD   6 each at 04/25/17 [phone]  . docusate sodium (COLACE) capsule 100 mg  100 mg Oral Daily Jani Gravel, MD   100 mg at 04/25/17 1043  . escitalopram (LEXAPRO) tablet 10 mg  10 mg Oral Daily Jani Gravel, MD   10 mg at 04/25/17 1043  . furosemide (LASIX) tablet 40 mg  40 mg Oral Daily Bonnielee Haff, MD   40 mg at 04/25/17 1043  . HYDROcodone-acetaminophen (NORCO/VICODIN) 5-325 MG per tablet 1 tablet  1 tablet Oral Q4H PRN Jani Gravel, MD      . ipratropium-albuterol (DUONEB) 0.5-2.5 (3) MG/3ML nebulizer solution 3 mL  3 mL Nebulization Q6H PRN Jani Gravel, MD      . MEDLINE mouth rinse  15 mL Mouth Rinse BID Jani Gravel, MD   15 mL at 04/24/17 1044  . metoprolol succinate (TOPROL-XL) 24 hr tablet 25 mg  25 mg Oral Daily Jani Gravel, MD   25 mg at 04/25/17 1043  . midodrine (PROAMATINE) tablet 2.5 mg  2.5 mg Oral PRN Jani Gravel, MD      . mirtazapine (REMERON) tablet 7.5 mg  7.5 mg Oral QHS Jani Gravel, MD   7.5 mg at 04/24/17 2144  . mupirocin ointment (BACTROBAN) 2 % 1 application  1 application Nasal BID Jani Gravel, MD   1 application at 67/89/38 1046  . nitroGLYCERIN (NITROSTAT) SL tablet 0.4 mg  0.4 mg Sublingual Q5 min PRN Duffy Bruce, MD   0.4 mg at 04/21/17 2333  . ondansetron (ZOFRAN) tablet 4 mg  4 mg Oral Q6H PRN Jani Gravel, MD      . predniSONE (DELTASONE) tablet 60 mg  60 mg Oral BID WC Bonnielee Haff, MD   60 mg at 04/25/17 0754  . sodium chloride flush (NS) 0.9 % injection 3 mL  3 mL Intravenous Q12H Jani Gravel, MD   3 mL at 04/24/17 2149  . sodium chloride flush (NS) 0.9 % injection 3 mL  3 mL Intravenous PRN Jani Gravel, MD         Discharge Medications: START taking these medications   Details  amoxicillin-clavulanate (AUGMENTIN) 875-125 MG tablet Take 1 tablet every 12 (twelve) hours for 5 days by  mouth. Qty: 10 tablet, Refills: 0          CONTINUE these medications which have CHANGED   Details  ALPRAZolam (XANAX) 0.25 MG tablet Take 1 tablet (0.25 mg total) at bedtime as needed by mouth for sleep. Qty: 15 tablet, Refills: 0    docusate sodium (COLACE) 100 MG capsule Take 1 capsule (100 mg total) daily as needed by mouth for mild constipation. Qty: 10 capsule, Refills: 0    furosemide (LASIX) 40 MG tablet Take 0.5 tablets (20 mg total) daily by mouth. Qty: 30 tablet, Refills: 1    HYDROcodone-acetaminophen (NORCO/VICODIN) 5-325 MG tablet Take 1-2 tablets every 4 (four) hours as needed by mouth  for moderate pain. Qty: 15 tablet, Refills: 0    potassium chloride SA (K-DUR,KLOR-CON) 20 MEQ tablet Take 1 tablet (20 mEq total) daily by mouth. Qty: 30 tablet, Refills: 0    predniSONE (DELTASONE) 10 MG tablet Take 6 tablets once daily for 3 days, then take 4 tablets once daily for 3 days, then take 2 tablets once daily for 3 days and then 1 tablet once daily as before. Qty: 60 tablet, Refills: 0          CONTINUE these medications which have NOT CHANGED   Details  acetaminophen (TYLENOL) 325 MG tablet Take 325 mg by mouth every 6 (six) hours as needed for mild pain, moderate pain or fever.     acetaminophen (TYLENOL) 650 MG suppository Place 650 mg rectally every 6 (six) hours as needed for moderate pain.    alendronate (FOSAMAX) 70 MG tablet Take 70 mg by mouth every Saturday. Take with a full glass of water on an empty stomach.    apixaban (ELIQUIS) 2.5 MG TABS tablet Take 1 tablet (2.5 mg total) by mouth 2 (two) times daily. Qty: 180 tablet, Refills: 1    escitalopram (LEXAPRO) 10 MG tablet Take 10 mg by mouth daily.    Ipratropium-Albuterol (COMBIVENT RESPIMAT) 20-100 MCG/ACT AERS respimat Inhale 1 puff into the lungs every 6 (six) hours as needed for wheezing or shortness of breath. Qty: 1 Inhaler, Refills: 2    metoprolol succinate (TOPROL-XL) 25 MG  24 hr tablet Take 25 mg by mouth daily.  Refills: 3    midodrine (PROAMATINE) 2.5 MG tablet Take 2.5 mg by mouth as needed (when systolic BP is lower than 100).     mirtazapine (REMERON) 15 MG tablet Take 7.5 mg by mouth at bedtime.     ondansetron (ZOFRAN) 4 MG tablet Take 4 mg by mouth every 6 (six) hours as needed for nausea or vomiting.    albuterol (PROVENTIL) (2.5 MG/3ML) 0.083% nebulizer solution Take 3 mLs (2.5 mg total) by nebulization every 6 (six) hours as needed for wheezing or shortness of breath. Qty: 75 mL, Refills: 12      Relevant Imaging Results:  Relevant Lab Results:   Additional Information SSN [id number]. Needs Home Health PT at facility: [facility], [place]

## 2017-04-25 NOTE — Discharge Instructions (Signed)
Aspiration Pneumonia °Aspiration pneumonia is an infection in your lungs. It occurs when food, liquid, or stomach contents (vomit) are inhaled (aspirated) into your lungs. When these things get into your lungs, swelling (inflammation) and infection can occur. This can make it difficult for you to breathe. Aspiration pneumonia is a serious condition and can be life threatening. °What increases the risk? °Aspiration pneumonia is more likely to occur when a person's cough (gag) reflex or ability to swallow has been decreased. Some things that can do this include: °· Having a brain injury or disease, such as stroke, seizures, Parkinson's disease, dementia, or amyotrophic lateral sclerosis (ALS). °· Being given general anesthetic for procedures. °· Being in a coma (unconscious). °· Having a narrowing of the tube that carries food to the stomach (esophagus). °· Drinking too much alcohol. If a person passes out and vomits, vomit can be swallowed into the lungs. °· Taking certain medicines, such as tranquilizers or sedatives. ° °What are the signs or symptoms? °· Coughing after swallowing food or liquids. °· Breathing problems, such as wheezing or shortness of breath. °· Bluish skin. This can be caused by lack of oxygen. °· Coughing up food or mucus. The mucus might contain blood, greenish material, or yellowish-white fluid (pus). °· Fever. °· Chest pain. °· Being more tired than usual (fatigue). °· Sweating more than usual. °· Bad breath. °How is this diagnosed? °A physical exam will be done. During the exam, the health care provider will listen to your lungs with a stethoscope to check for: °· Crackling sounds in the lungs. °· Decreased breath sounds. °· A rapid heartbeat. ° °Various tests may be ordered. These may include: °· Chest X-ray. °· CT scan. °· Swallowing study. This test looks at how food is swallowed and whether it goes into your breathing tube (trachea) or food pipe (esophagus). °· Sputum culture. Saliva and  mucus (sputum) are collected from the lungs or the tubes that carry air to the lungs (bronchi). The sputum is then tested for bacteria. °· Bronchoscopy. This test uses a flexible tube (bronchoscope) to see inside the lungs. ° °How is this treated? °Treatment will usually include antibiotic medicines. Other medicines may also be used to reduce fever or pain. You may need to be treated in the hospital. In the hospital, your breathing will be carefully monitored. Depending on how well you are breathing, you may need to be given oxygen, or you may need breathing support from a breathing machine (ventilator). For people who fail a swallowing study, a feeding tube might be placed in the stomach, or they may be asked to avoid certain food textures or liquids when they eat. °Follow these instructions at home: °· Carefully follow any special eating instructions you were given, such as avoiding certain food textures or thickening liquids. This reduces the risk of developing aspiration pneumonia again. °· Only take over-the-counter or prescription medicines as directed by your health care provider. Follow the directions carefully. °· If you were prescribed antibiotics, take them as directed. Finish them even if you start to feel better. °· Rest as instructed by your health care provider. °· Keep all follow-up appointments with your health care provider. °Contact a health care provider if: °· You develop worsening shortness of breath, wheezing, or difficulty breathing. °· You develop a fever. °· You have chest pain. °This information is not intended to replace advice given to you by your health care provider. Make sure you discuss any questions you have with your health care   provider. °Document Released: 04/03/2009 Document Revised: 11/18/2015 Document Reviewed: 11/22/2012 °Elsevier Interactive Patient Education © 2017 [employer] ° °

## 2017-04-25 NOTE — Progress Notes (Signed)
Report called and given  to the receiving RN at [facility].

## 2017-04-25 NOTE — Progress Notes (Signed)
CSW received confirmation by pt's ALF [facility] that DC information received/reviewed and they are prepared for her return. Report [phone], ask for med tech on [employer]. Son-in-law to transport- pt states she has called him.  Sharren Bridge, MSW, LCSW Clinical Social Work 04/25/2017 [phone]

## 2017-04-25 NOTE — Discharge Summary (Signed)
[facility]  Physician Discharge Summary   Patient ID: [name] MRN: [id number] DOB/AGE: 01-16-1931 83 y.o.  Admit date: 04/21/2017 Discharge date: 04/25/2017  PCP: Lajean Manes, MD  DISCHARGE DIAGNOSES:  Principal Problem:   HCAP (healthcare-associated pneumonia) Active Problems:   Encounter for palliative care   Goals of care, counseling/discussion   Shortness of breath   RECOMMENDATIONS FOR OUTPATIENT FOLLOW UP: 1. Palliative medicine to continue to follow the patient at the assisted living facility 2. Please note steroid taper as mentioned below.  After the end of the taper she should resume her usual dose of prednisone.  DISCHARGE CONDITION: fair  Diet recommendation: Heart healthy  Filed Weights   04/22/17 0303 04/22/17 0910  Weight: 63 kg (139 lb) 62.7 kg (138 lb 3.7 oz)    INITIAL HISTORY: 83 year old Caucasian female with a history of sick sinus syndrome, complete heart block, pacemaker placement, diastolic CHF, paroxysmal atrial fibrillation on anticoagulation, COPD dementia who lives in assisted living facility and was brought into the hospital due to worsening respiratory status.  She was thought to have a combination of COPD exacerbation as well as diastolic CHF.  She was hospitalized for further management.  Consultations:  Palliative medicine  Procedures:  None   HOSPITAL COURSE:   Acute hypoxic respiratory failure Etiology was thought to be multifactorial including COPD exacerbation, pulmonary edema or possible pneumonia.  She initially required BiPAP. She was successfully taken off of BiPAP.  Oxygen saturations are normal at rest.    Oxygen saturations 94% after ambulation.   Acute diastolic congestive heart failure Chest x-ray did show evidence for pulmonary edema.  She also has bilateral pleural effusions.  Patient was given IV Lasix.  She has diuresed well.  Her weight has decreased.  She was changed over to oral Lasix.  She  will be discharged on the same.  Replace potassium.  Possible acute COPD exacerbation Seems to be stable.  Patient does have a history of COPD and has smoked extensively.  Continue steroids and nebulizer treatments.  Leukocytosis is due to steroids.  Changed to oral prednisone and start tapering.  Healthcare associated pneumonia Chest x-ray showed opacities which were concerning for an infectious process.  Patient recently completed a course of antibiotics (doxycycline).  She was recently hospitalized for similar presentation.  She was empirically started on vancomycin and cefepime which can be continued for now.  There could be an element of aspiration as well.  Patient seen by speech therapy.  Underwent modified barium swallow.  Remains on regular diet with thin liquids.   Cultures negative so far.  Changed over to Augmentin.  Paroxysmal atrial fibrillation Stable. Continue Eliquis.  Continue beta-blocker.    History of polymyalgia rheumatica She is on prednisone at home.  History of sick sinus syndrome/heart block/pacemaker Stable.  Continue to monitor.  EKG reviewed  Goals of care Palliative medicine team was consulted as patient has had multiple hospitalizations this year especially in the last 2-3 months.  All of these have been for respiratory issues.  These issues were discussed with patient's daughter who feels that her mother is slowly declining.  Patient seen by palliative medicine.  Palliative medicine to continue to follow at her assisted living facility.  Overall stable.  Okay for discharge to ALF today.    PERTINENT LABS:  The results of significant diagnostics from this hospitalization (including imaging, microbiology, ancillary and laboratory) are listed below for reference.    Microbiology: Recent Results (from the past 240 hour(s))  Blood culture (routine x 2)     Status: None (Preliminary result)   Collection Time: 04/21/17 10:50 PM  Result Value Ref Range  Status   Specimen Description BLOOD LEFT ANTECUBITAL  Final   Special Requests   Final    BOTTLES DRAWN AEROBIC AND ANAEROBIC Blood Culture adequate volume   Culture   Final    NO GROWTH 2 DAYS Performed at [facility], [address], [place], [place] [address]    Report Status PENDING  Incomplete  Blood culture (routine x 2)     Status: None (Preliminary result)   Collection Time: 04/21/17 11:30 PM  Result Value Ref Range Status   Specimen Description BLOOD RIGHT HAND  Final   Special Requests   Final    BOTTLES DRAWN AEROBIC AND ANAEROBIC Blood Culture adequate volume   Culture   Final    NO GROWTH 2 DAYS Performed at [facility], [place] [address] [place], [place] [address]    Report Status PENDING  Incomplete  MRSA PCR Screening     Status: Abnormal   Collection Time: 04/22/17  9:03 AM  Result Value Ref Range Status   MRSA by PCR POSITIVE (A) NEGATIVE Final    Comment:        The GeneXpert MRSA Assay (FDA approved for NASAL specimens only), is one component of a comprehensive MRSA colonization surveillance program. It is not intended to diagnose MRSA infection nor to guide or monitor treatment for MRSA infections. RESULT CALLED TO, READ BACK BY AND VERIFIED WITH: T.ARNOLD,RN [id number] @1210  BY V.WILKINS      Labs: Basic Metabolic Panel: Recent Labs  Lab 04/21/17 2245 04/23/17 0329 04/24/17 0712 04/25/17 0432  NA 140 138 141 143  K 4.0 3.8 3.7 3.9  CL 106 94* 100* 101  CO2 26 32 31 31  GLUCOSE 110* 152* 157* 136*  BUN 20 23* 29* 30*  CREATININE 0.87 0.79 0.87 0.75  CALCIUM 8.8* 8.3* 8.8* 8.9   Liver Function Tests: Recent Labs  Lab 04/21/17 2245  AST 25  ALT 16  ALKPHOS 72  BILITOT 0.8  PROT 6.6  ALBUMIN 3.3*   CBC: Recent Labs  Lab 04/21/17 2245 04/22/17 0927 04/23/17 0329  WBC 19.0* 22.8* 22.9*  NEUTROABS 14.2*  --   --   HGB 12.1 11.6* 11.4*  HCT 39.1 37.2 34.9*  MCV 91.4 89.9 88.6  PLT 435* 320 337    BNP: BNP (last 3  results) Recent Labs    03/18/17 2142 04/02/17 1859 04/21/17 2330  BNP 706.7* 722.3* 917.2*     IMAGING STUDIES Dg Chest 2 View  Result Date: 04/02/2017 CLINICAL DATA:  Short of breath, congestive heart failure EXAM: CHEST  2 VIEW COMPARISON:  Radiograph 03/18/2017 FINDINGS: RIGHT-sided pacemaker. Normal cardiac silhouette. Lungs are hyperinflated. Two nodular densities in the LEFT lung are new from prior. Lungs are hyperinflated. Small effusion on the LEFT. Chronic compression deformities of the midthoracic spine. IMPRESSION: Two nodular densities in the LEFT lung are new. Small effusion on the LEFT. Findings suggest lingular pneumonia as lesions are not seen on please comparison exam. Followup PA and lateral chest X-ray is recommended in 3-4 weeks following trial of antibiotic therapy to ensure resolution and exclude underlying malignancy. Electronically Signed   By: Suzy Bouchard M.D.   On: 04/02/2017 18:51   Ct Chest Wo Contrast  Result Date: 04/22/2017 CLINICAL DATA:  Evaluate pleural effusion. EXAM: CT CHEST WITHOUT CONTRAST TECHNIQUE: Multidetector CT imaging  of the chest was performed following the standard protocol without IV contrast. COMPARISON:  Chest radiograph 04/21/2017.  Chest CT, 01/02/2017. FINDINGS: Cardiovascular: Heart is mildly enlarged. There are three-vessel dense coronary artery calcifications. Right anterior chest wall sequential pacemaker leads are well-positioned in the right atrium right ventricle. Aorta is normal in caliber. Atherosclerotic calcifications are noted along the aortic arch and descending portions. Mild dilation of both pulmonary arteries, right measuring 3 cm and left 2.9 cm. Mediastinum/Nodes: No neck base or axillary masses or enlarged lymph nodes. There are several prominent to mildly enlarged mediastinal lymph nodes. There is a left para carinal node measuring 1 cm short axis. A subcarinal node measures 15 mm in short axis. No mediastinal or hilar  masses. Trachea is widely patent. The esophagus is mildly distended but otherwise unremarkable. Lungs/Pleura: Small left and minimal right pleural effusions. There is confluent opacity in the dependent lower lobes, left greater than right, which may all be atelectasis. A component of infection should be considered in the proper clinical setting. There is additional confluent opacity in the inferomedial left upper lobe, lingula, likely atelectasis. There also areas of peribronchovascular ground-glass opacity in the left upper lobe lingula and right lower lobe. An irregular area focal opacity is noted in the left lower lobe centered on image 103, series 7 measuring 2.4 x 1.0 cm transversely. Mild peripheral interstitial thickening is noted bilaterally most evident in the right upper lobe. No pneumothorax. Upper Abdomen: Multiple low-density liver masses consistent with cysts, stable. No acute findings. Musculoskeletal: Moderate compression fracture with associated sclerosis within the T8 vertebra, new from a thoracic spine CT dated 01/10/2017. Moderate compression fracture treated with vertebroplasty at T9, stable. No other fractures. No osteolytic lesions. IMPRESSION: 1. Small left and minimal right pleural effusions. 2. Areas of lung opacity as detailed above, with the dependent lower lobe opacity most likely atelectasis, and other areas of opacity consistent with multifocal pneumonia. There is mild interstitial thickening that appears chronic. No convincing pulmonary edema. 3. Cardiomegaly and coronary artery calcifications. 4. Aortic atherosclerosis. 5. New moderate compression fracture of T8 when compared to the thoracic spine CT dated 01/10/2017. This was present on a chest radiograph dated 04/02/2017. Aortic Atherosclerosis (ICD10-I70.0). Electronically Signed   By: Lajean Manes M.D.   On: 04/22/2017 12:29   Dg Chest Portable 1 View  Result Date: 04/21/2017 CLINICAL DATA:  Shortness of breath.  Recent  diagnosis of pneumonia. EXAM: PORTABLE CHEST 1 VIEW COMPARISON:  Radiograph 04/02/2017.  Chest CTA 01/02/2017 FINDINGS: Right-sided pacemaker in place. Cardiomegaly with progression. Atherosclerosis of the aortic arch. Diffuse pulmonary edema. Left pleural effusion and basilar opacity, progressed from prior exam. Persistent nodular opacity in the left lung base. Small right pleural effusion and atelectasis also noted. No pneumothorax. IMPRESSION: CHF with increasing cardiomegaly, pulmonary edema, and bilateral pleural effusions, left greater than right. Left basilar opacities, some of which is nodular, increased from prior exam, may be infectious or inflammatory. No nodules were seen chest CTA 01/02/2017 or radiographs 03/18/2017, making neoplasm unlikely. Electronically Signed   By: Jeb Levering M.D.   On: 04/21/2017 23:11   Dg Swallowing Func-speech Pathology  Result Date: 04/22/2017 Objective Swallowing Evaluation: Type of Study: MBS-Modified Barium Swallow Study Patient Details Name: [name] MRN: [id number] Date of Birth: [birth year] Today's Date: 04/22/2017 Time: SLP Start Time (ACUTE ONLY): 1225-SLP Stop Time (ACUTE ONLY): 1243 SLP Time Calculation (min) (ACUTE ONLY): 18 min Past Medical History: Past Medical History: Diagnosis Date . CHB (complete heart block) (  [place]) 07/13/2013  Pacemaker dependent . CHF (congestive heart failure) ([place])  . COPD (chronic obstructive pulmonary disease) ([place])  . DM (dermatomyositis)  . Orthostatic hypotension  . Pacemaker 07/13/2013  Her original pacemaker and the current leads were implanted in 1992. She has had 2 generator change out, most recently in 2010. Her device is a Buyer, retail 2110 non RF dual-chamber pacemaker with a battery longevity estimated at about 7 years. The atrial lead is a St. Jude [id number] and the ventricular lead was a Biotronik [id number].  [name] Paroxysmal atrial fibrillation ([place]) 09/04/2014  on Eliquis . Polymyalgia rheumatica ([place])  . Scoliosis  .  SSS (sick sinus syndrome) ([place]) 07/13/2013 . Syncope  . Systemic hypertension  . Thoracic kyphosis  Past Surgical History: Past Surgical History: Procedure Laterality Date . IR KYPHO THORACIC WITH BONE BIOPSY  01/04/2017 . IR RADIOLOGIST EVAL & MGMT  01/10/2017 . NM MYOCAR PERF WALL MOTION  09/13/2011  Low risk . PERMANENT PACEMAKER GENERATOR CHANGE  03/27/2009  St.Jude . US ECHOCARDIOGRAPHY  03/28/2012  Mod LAE,mild MR,aortic sclerosis w/mod AI,mod. TR,mild PI,Stage I diastolic dysfunction . VIDEO BRONCHOSCOPY Bilateral 10/07/2016  Procedure: VIDEO BRONCHOSCOPY WITH FLUORO;  Surgeon: Marshell Garfinkel, MD;  Location: [facility];  Service: Cardiopulmonary;  Laterality: Bilateral; HPI: CarolWinslowis a 83 y.o.female, w sick sinus syndrome, complete hb, s/p pacer, diastolic CHF, Pafib, Copd, dermatomyostitis/PMR, scoliosis, dementia who apparently developed sob this past evening. Pt notes cough with yellow sputum. Pt with recent hospitalization 04/02/17 for PNA. Chest x ray 11/2 revealed CHF with increasing cardiomegaly, pulmonary edema, and bilateral No Data Recorded Assessment / Plan / Recommendation CHL IP CLINICAL IMPRESSIONS 04/22/2017 Clinical Impression Pt presents with a mild to moderate oropharyngeal dypsphagia with sensory and motor involvement. Oral phase characterized by delayed oral transit and premature spillage. Pt with consistent mistimed swallow initiation as well as decreased laryngeal vestibule closure resulting in frequent deep penetration before the swallow of thin and nectar thick liquids. Pentration amount increased with larger bolus quanities. Trace penetrates remained in the laryngeal vestibule at the level of the vocal cords post swallow. Cues for throat clear aided in clearance, though pt did not consistently sense penetration instances. No penetration noted with puree, honey thick, or regular consistencies. No aspiration visualized throughout the swallow study, however anticipate aross a meal  pt is having trace aspiration based upon swallow deficits noted on MBS and pts hx including recurrent PNA. Discussed in length swallow deficits with pt and pts daughter whom at this time prefer not to have diet modifications and instead focus on quality of life. A regular diet and thin liquids with swallow precautions was requested. Safe swallow strategies recommended include no straws, no mixed consistencies, and medicines whole in puree with full supervision with all PO. Pts daughter mentioned future palliative consult. ST to continue to monitor for needs and goals of care.  SLP Visit Diagnosis Dysphagia, oropharyngeal phase (R13.12) Attention and concentration deficit following -- Frontal lobe and executive function deficit following -- Impact on safety and function Moderate aspiration risk   CHL IP TREATMENT RECOMMENDATION 04/22/2017 Treatment Recommendations Therapy as outlined in treatment plan below   Prognosis 04/22/2017 Prognosis for Safe Diet Advancement Fair Barriers to Reach Goals Cognitive deficits Barriers/Prognosis Comment -- CHL IP DIET RECOMMENDATION 04/22/2017 SLP Diet Recommendations Regular solids;Thin liquid;No mixed consistencies Liquid Administration via Cup;No straw Medication Administration Whole meds with puree Compensations Small sips/bites;Slow rate;Minimize environmental distractions;Clear throat intermittently;Multiple dry swallows after each bite/sip Postural Changes Remain semi-upright after after feeds/meals (  Comment);Seated upright at 90 degrees   CHL IP OTHER RECOMMENDATIONS 04/22/2017 Recommended Consults -- Oral Care Recommendations Oral care BID Other Recommendations --   CHL IP FOLLOW UP RECOMMENDATIONS 04/22/2017 Follow up Recommendations 24 hour supervision/assistance   CHL IP FREQUENCY AND DURATION 04/22/2017 Speech Therapy Frequency (ACUTE ONLY) min 2x/week Treatment Duration 1 week      CHL IP ORAL PHASE 04/22/2017 Oral Phase Impaired Oral - Pudding Teaspoon -- Oral - Pudding Cup  -- Oral - Honey Teaspoon Delayed oral transit Oral - Honey Cup Delayed oral transit Oral - Nectar Teaspoon Delayed oral transit;Premature spillage Oral - Nectar Cup Delayed oral transit;Premature spillage Oral - Nectar Straw -- Oral - Thin Teaspoon Delayed oral transit;Premature spillage Oral - Thin Cup Delayed oral transit;Premature spillage Oral - Thin Straw -- Oral - Puree Decreased bolus cohesion;Piecemeal swallowing Oral - Mech Soft -- Oral - Regular Piecemeal swallowing;Delayed oral transit Oral - Multi-Consistency -- Oral - Pill Delayed oral transit;Lingual pumping Oral Phase - Comment --  CHL IP PHARYNGEAL PHASE 04/22/2017 Pharyngeal Phase Impaired Pharyngeal- Pudding Teaspoon -- Pharyngeal -- Pharyngeal- Pudding Cup -- Pharyngeal -- Pharyngeal- Honey Teaspoon Delayed swallow initiation-vallecula;Pharyngeal residue - valleculae;Reduced tongue base retraction Pharyngeal Material does not enter airway Pharyngeal- Honey Cup Delayed swallow initiation-vallecula;Reduced tongue base retraction;Pharyngeal residue - valleculae Pharyngeal Material does not enter airway Pharyngeal- Nectar Teaspoon Reduced airway/laryngeal closure;Penetration/Aspiration before swallow Pharyngeal Material enters airway, CONTACTS cords and not ejected out Pharyngeal- Nectar Cup Reduced airway/laryngeal closure;Penetration/Aspiration before swallow Pharyngeal Material enters airway, CONTACTS cords and not ejected out Pharyngeal- Nectar Straw -- Pharyngeal -- Pharyngeal- Thin Teaspoon Reduced airway/laryngeal closure;Penetration/Aspiration before swallow Pharyngeal Material enters airway, remains ABOVE vocal cords and not ejected out Pharyngeal- Thin Cup Reduced airway/laryngeal closure;Penetration/Aspiration before swallow Pharyngeal Material enters airway, CONTACTS cords and not ejected out Pharyngeal- Thin Straw Penetration/Aspiration before swallow;Reduced airway/laryngeal closure Pharyngeal Material enters airway, CONTACTS cords and  not ejected out Pharyngeal- Puree Delayed swallow initiation-vallecula;Reduced tongue base retraction;Pharyngeal residue - valleculae Pharyngeal -- Pharyngeal- Mechanical Soft -- Pharyngeal -- Pharyngeal- Regular Delayed swallow initiation-vallecula;Reduced tongue base retraction;Pharyngeal residue - valleculae Pharyngeal -- Pharyngeal- Multi-consistency -- Pharyngeal -- Pharyngeal- Pill Reduced airway/laryngeal closure;Penetration/Aspiration before swallow Pharyngeal Material enters airway, CONTACTS cords and not ejected out Pharyngeal Comment --  CHL IP CERVICAL ESOPHAGEAL PHASE 04/22/2017 Cervical Esophageal Phase WFL Pudding Teaspoon -- Pudding Cup -- Honey Teaspoon -- Honey Cup -- Nectar Teaspoon -- Nectar Cup -- Nectar Straw -- Thin Teaspoon -- Thin Cup -- Thin Straw -- Puree -- Mechanical Soft -- Regular -- Multi-consistency -- Pill -- Cervical Esophageal Comment -- No flowsheet data found. Arvil Chaco MA, CCC-SLP Acute Care Speech Language Pathologist  Levi Aland 04/22/2017, 1:14 PM               DISCHARGE EXAMINATION: Vitals:   04/24/17 1950 04/24/17 2050 04/25/17 0540 04/25/17 0745  BP:  (!) 148/64 (!) 164/76   Pulse:  80 76   Resp:  18    Temp:  98 F (36.7 C) 98.3 F (36.8 C)   TempSrc:  Oral Oral   SpO2: 93% 98% 94% 95%  Weight:      Height:       General appearance: alert, cooperative, appears stated age and no distress Resp: Improved air entry bilaterally.  No wheezing rales or rhonchi. Cardio: regular rate and rhythm, S1, S2 normal, no murmur, click, rub or gallop GI: soft, non-tender; bowel sounds normal; no masses,  no organomegaly Extremities: extremities normal, atraumatic, no cyanosis or edema  DISPOSITION: ALF    ALLERGIES: No Known Allergies   Current Discharge Medication List    START taking these medications   Details  amoxicillin-clavulanate (AUGMENTIN) 875-125 MG tablet Take 1 tablet every 12 (twelve) hours for 5 days by mouth. Qty: 10 tablet,  Refills: 0      CONTINUE these medications which have CHANGED   Details  ALPRAZolam (XANAX) 0.25 MG tablet Take 1 tablet (0.25 mg total) at bedtime as needed by mouth for sleep. Qty: 15 tablet, Refills: 0    docusate sodium (COLACE) 100 MG capsule Take 1 capsule (100 mg total) daily as needed by mouth for mild constipation. Qty: 10 capsule, Refills: 0    furosemide (LASIX) 40 MG tablet Take 0.5 tablets (20 mg total) daily by mouth. Qty: 30 tablet, Refills: 1    HYDROcodone-acetaminophen (NORCO/VICODIN) 5-325 MG tablet Take 1-2 tablets every 4 (four) hours as needed by mouth for moderate pain. Qty: 15 tablet, Refills: 0    potassium chloride SA (K-DUR,KLOR-CON) 20 MEQ tablet Take 1 tablet (20 mEq total) daily by mouth. Qty: 30 tablet, Refills: 0    predniSONE (DELTASONE) 10 MG tablet Take 6 tablets once daily for 3 days, then take 4 tablets once daily for 3 days, then take 2 tablets once daily for 3 days and then 1 tablet once daily as before. Qty: 60 tablet, Refills: 0      CONTINUE these medications which have NOT CHANGED   Details  acetaminophen (TYLENOL) 325 MG tablet Take 325 mg by mouth every 6 (six) hours as needed for mild pain, moderate pain or fever.     acetaminophen (TYLENOL) 650 MG suppository Place 650 mg rectally every 6 (six) hours as needed for moderate pain.    alendronate (FOSAMAX) 70 MG tablet Take 70 mg by mouth every Saturday. Take with a full glass of water on an empty stomach.    apixaban (ELIQUIS) 2.5 MG TABS tablet Take 1 tablet (2.5 mg total) by mouth 2 (two) times daily. Qty: 180 tablet, Refills: 1    escitalopram (LEXAPRO) 10 MG tablet Take 10 mg by mouth daily.    Ipratropium-Albuterol (COMBIVENT RESPIMAT) 20-100 MCG/ACT AERS respimat Inhale 1 puff into the lungs every 6 (six) hours as needed for wheezing or shortness of breath. Qty: 1 Inhaler, Refills: 2    metoprolol succinate (TOPROL-XL) 25 MG 24 hr tablet Take 25 mg by mouth daily.  Refills: 3     midodrine (PROAMATINE) 2.5 MG tablet Take 2.5 mg by mouth as needed (when systolic BP is lower than 100).     mirtazapine (REMERON) 15 MG tablet Take 7.5 mg by mouth at bedtime.     ondansetron (ZOFRAN) 4 MG tablet Take 4 mg by mouth every 6 (six) hours as needed for nausea or vomiting.    albuterol (PROVENTIL) (2.5 MG/3ML) 0.083% nebulizer solution Take 3 mLs (2.5 mg total) by nebulization every 6 (six) hours as needed for wheezing or shortness of breath. Qty: 75 mL, Refills: 12      STOP taking these medications     Fluticasone-Umeclidin-Vilant (TRELEGY ELLIPTA) 100-62.5-25 MCG/INH AEPB         TOTAL DISCHARGE TIME: 35 minutes  [facility] Pager [phone]  04/25/2017, 10:26 AM

## 2017-04-26 ENCOUNTER — Other Ambulatory Visit: Payer: Self-pay

## 2017-04-26 DIAGNOSIS — J9601 Acute respiratory failure with hypoxia: Secondary | ICD-10-CM | POA: Diagnosis not present

## 2017-04-26 DIAGNOSIS — J441 Chronic obstructive pulmonary disease with (acute) exacerbation: Secondary | ICD-10-CM | POA: Diagnosis not present

## 2017-04-26 MED ORDER — UMECLIDINIUM-VILANTEROL 62.5-25 MCG/INH IN AEPB: 1.0000 | INHALATION_SPRAY | Freq: Every day | RESPIRATORY_TRACT | 5 refills | Status: DC

## 2017-04-26 MED ORDER — UMECLIDINIUM-VILANTEROL 62.5-25 MCG/INH IN AEPB: 1.0000 | INHALATION_SPRAY | Freq: Every day | RESPIRATORY_TRACT | 6 refills | Status: AC

## 2017-04-26 NOTE — Telephone Encounter (Signed)
PM- please see the email her daughter sent and advise recs, thanks!  My mother Debbie Ovens. Williamson dob: December 05, 2030) was released from the hospital today due to CHF/Pneumonia. (Third hospitalization in five weeks.) I thought we were going to lose her on Friday night. I noticed on her discharge papers, the Hospitalist took her off of the Trelegy Ellipta that you just put her on. Is that what you want to happen? If not, I'll need an order from you faxed to [facility] asap as they will be following the paperwork brought from the hospital removing her from the medication.   Please let me know as this is concerning to me.   Thanks!   Jill Side  [phone]

## 2017-04-26 NOTE — Telephone Encounter (Signed)
Looks like trelegy inhaler was denied by insurance. We will start her on a similar inhaler called anoro. We will fax in a prescription to the assisted living facility today. Thanks

## 2017-04-26 NOTE — Telephone Encounter (Addendum)
Spoke with pt's daughter [name], who request for Rx to be sent to [facility]. Pt has been scheduled for HFU on 05/02/17. Rx has been sent to [facility] for 1 puff daily.  Order has been sent to [facility]. Nothing further needed.

## 2017-04-26 NOTE — Telephone Encounter (Signed)
Will start her on Anoro. Prescription will be faxed to her assisted living facility  Marshell Garfinkel MD [facility] 04/26/2017, 10:43 AM

## 2017-04-27 LAB — CULTURE, BLOOD (ROUTINE X 2)
CULTURE: NO GROWTH
Culture: NO GROWTH
Special Requests: ADEQUATE
Special Requests: ADEQUATE

## 2017-05-02 ENCOUNTER — Other Ambulatory Visit: Payer: Medicare Other

## 2017-05-02 ENCOUNTER — Encounter: Payer: Self-pay | Admitting: Pulmonary Disease

## 2017-05-02 ENCOUNTER — Ambulatory Visit (INDEPENDENT_AMBULATORY_CARE_PROVIDER_SITE_OTHER): Payer: Medicare Other | Admitting: Pulmonary Disease

## 2017-05-02 VITALS — BP 104/60 | HR 73 | Ht 65.0 in | Wt 137.4 lb

## 2017-05-02 DIAGNOSIS — J9620 Acute and chronic respiratory failure, unspecified whether with hypoxia or hypercapnia: Secondary | ICD-10-CM

## 2017-05-02 DIAGNOSIS — T17908S Unspecified foreign body in respiratory tract, part unspecified causing other injury, sequela: Secondary | ICD-10-CM

## 2017-05-02 DIAGNOSIS — J438 Other emphysema: Secondary | ICD-10-CM | POA: Diagnosis not present

## 2017-05-02 DIAGNOSIS — T17908A Unspecified foreign body in respiratory tract, part unspecified causing other injury, initial encounter: Secondary | ICD-10-CM | POA: Insufficient documentation

## 2017-05-02 NOTE — Progress Notes (Addendum)
[name]    [id number]    June 13, 1931  Primary Care Physician:Stoneking, Christiane Ha, MD  Referring Physician: Lajean Manes, MD [address] [place], [place] [address]  Chief complaint:   Follow up for  COPD Recurrent pneumonia Chronic aspiration   HPI: 83 year old with history of complete heart block status post pacemaker, diastolic heart failure, paroxysmal A. fib, and prolymyalgia rheumatica, COPD. She was admitted to [facility] in April, 2018 while on a trip outside [place]. She did not seek medical attention for about a week and came with worsening dyspnea, cough, congestion, mucus production, wheezing. CT scan in the ED showed fractured ribs on the right with extensive consolidation, narrowing of the bronchus on the right with question on endobronchial lesion. PCCM called for further evaluation. She underwent a bronchoscopy on 10/07/16 which did not show any endobronchial lesion. There is purulent secretions in the right lung which was sent for culture. The microbiology shows Pseudomonas and viridans strep which was treated adequately with antibiotics. The AFB culture grew MAI.  Interim History: She's had multiple admissions this year after last clinic visit for acute on chronic respiratory failure, CHF, COPD exacerbation. She was discharged home on 03/20/17. Also noted to have a stress fracture in T8. She is currently on prednisone taper.  Readmitted on 04/21/17 with hospital-acquired pneumonia, COPD exacerbation and acute diastolic heart failure.  Swallow eval during this admission noted to have chronic aspiration and placed on aspiration precautions She was seen by palliative care who recommended palliative care follow-up as an outpatient.   Today in the office she is doing well.  Her inhalers have been switched to Anoro.  She denies any dyspnea, wheezing.  She has chronic cough with white mucus.  Outpatient Encounter Medications as of 05/02/2017  Medication Sig    . acetaminophen (TYLENOL) 325 MG tablet Take 325 mg by mouth every 6 (six) hours as needed for mild pain, moderate pain or fever.   [name] albuterol (PROVENTIL) (2.5 MG/3ML) 0.083% nebulizer solution Take 3 mLs (2.5 mg total) by nebulization every 6 (six) hours as needed for wheezing or shortness of breath.  [name] alendronate (FOSAMAX) 70 MG tablet Take 70 mg by mouth every Saturday. Take with a full glass of water on an empty stomach.  . ALPRAZolam (XANAX) 0.25 MG tablet Take 1 tablet (0.25 mg total) at bedtime as needed by mouth for sleep.  [name] apixaban (ELIQUIS) 2.5 MG TABS tablet Take 1 tablet (2.5 mg total) by mouth 2 (two) times daily.  [name] docusate sodium (COLACE) 100 MG capsule Take 1 capsule (100 mg total) daily as needed by mouth for mild constipation.  [name] escitalopram (LEXAPRO) 10 MG tablet Take 10 mg by mouth daily.  . furosemide (LASIX) 40 MG tablet Take 0.5 tablets (20 mg total) daily by mouth.  [name] HYDROcodone-acetaminophen (NORCO/VICODIN) 5-325 MG tablet Take 1-2 tablets every 4 (four) hours as needed by mouth for moderate pain.  . Ipratropium-Albuterol (COMBIVENT RESPIMAT) 20-100 MCG/ACT AERS respimat Inhale 1 puff into the lungs every 6 (six) hours as needed for wheezing or shortness of breath.  . metoprolol succinate (TOPROL-XL) 25 MG 24 hr tablet Take 25 mg by mouth daily.   . midodrine (PROAMATINE) 2.5 MG tablet Take 2.5 mg by mouth as needed (when systolic BP is lower than 100).   . mirtazapine (REMERON) 15 MG tablet Take 7.5 mg by mouth at bedtime.   . potassium chloride SA (K-DUR,KLOR-CON) 20 MEQ tablet Take  1 tablet (20 mEq total) daily by mouth.  . predniSONE (DELTASONE) 10 MG tablet Take 6 tablets once daily for 3 days, then take 4 tablets once daily for 3 days, then take 2 tablets once daily for 3 days and then 1 tablet once daily as before.  . umeclidinium-vilanterol (ANORO ELLIPTA) 62.5-25 MCG/INH AEPB Inhale 1 puff daily into the lungs.  . [DISCONTINUED] acetaminophen (TYLENOL) 650  MG suppository Place 650 mg rectally every 6 (six) hours as needed for moderate pain.  . [DISCONTINUED] ondansetron (ZOFRAN) 4 MG tablet Take 4 mg by mouth every 6 (six) hours as needed for nausea or vomiting.   No facility-administered encounter medications on file as of 05/02/2017.     Allergies as of 05/02/2017  . (No Known Allergies)    Past Medical History:  Diagnosis Date  . CHB (complete heart block) ([place]) 07/13/2013   Pacemaker dependent  . CHF (congestive heart failure) ([place])   . COPD (chronic obstructive pulmonary disease) ([place])   . DM (dermatomyositis)   . Orthostatic hypotension   . Pacemaker 07/13/2013   Her original pacemaker and the current leads were implanted in 1992. She has had 2 generator change out, most recently in 2010. Her device is a Buyer, retail 2110 non RF dual-chamber pacemaker with a battery longevity estimated at about 7 years. The atrial lead is a St. Jude [id number] and the ventricular lead was a Biotronik [id number].   [name] Paroxysmal atrial fibrillation ([place]) 09/04/2014   on Eliquis  . Polymyalgia rheumatica ([place])   . Scoliosis   . SSS (sick sinus syndrome) ([place]) 07/13/2013  . Syncope   . Systemic hypertension   . Thoracic kyphosis     Past Surgical History:  Procedure Laterality Date  . IR KYPHO THORACIC WITH BONE BIOPSY  01/04/2017  . IR RADIOLOGIST EVAL & MGMT  01/10/2017  . NM MYOCAR PERF WALL MOTION  09/13/2011   Low risk  . PERMANENT PACEMAKER GENERATOR CHANGE  03/27/2009   St.Jude  . US ECHOCARDIOGRAPHY  03/28/2012   Mod LAE,mild MR,aortic sclerosis w/mod AI,mod. TR,mild PI,Stage I diastolic dysfunction    Family History  Problem Relation Age of Onset  . Stroke Mother     Social History   Socioeconomic History  . Marital status: Married    Spouse name: Not on file  . Number of children: Not on file  . Years of education: Not on file  . Highest education level: Not on file  Social Needs  . Financial resource strain: Not on file  . Food  insecurity - worry: Not on file  . Food insecurity - inability: Not on file  . Transportation needs - medical: Not on file  . Transportation needs - non-medical: Not on file  Occupational History  . Occupation: retired  Tobacco Use  . Smoking status: Former Smoker    Packs/day: 0.50    Years: 60.00    Pack years: 30.00    Types: Cigarettes  . Smokeless tobacco: Never Used  . Tobacco comment: quit 12/2016  Substance and Sexual Activity  . Alcohol use: No  . Drug use: No  . Sexual activity: Not Currently    Partners: Female  Other Topics Concern  . Not on file  Social History Narrative  . Not on file   Review of systems: Review of Systems  Constitutional: Negative for fever and chills.  HENT: Negative.   Eyes: Negative for blurred vision.  Respiratory: as per HPI  Cardiovascular: Negative for chest  pain and palpitations.  Gastrointestinal: Negative for vomiting, diarrhea, blood per rectum. Genitourinary: Negative for dysuria, urgency, frequency and hematuria.  Musculoskeletal: Negative for myalgias, back pain and joint pain.  Skin: Negative for itching and rash.  Neurological: Negative for dizziness, tremors, focal weakness, seizures and loss of consciousness.  Endo/Heme/Allergies: Negative for environmental allergies.  Psychiatric/Behavioral: Negative for depression, suicidal ideas and hallucinations.  All other systems reviewed and are negative.  Physical Exam: Blood pressure 110/62, pulse 75, height _0  (1.676 m), weight 61.7 kg (136 lb), SpO2 97 %. Gen:      No acute distress HEENT:  EOMI, sclera anicteric Neck:     No masses; no thyromegaly Lungs:    Clear to auscultation bilaterally; normal respiratory effort CV:         Regular rate and rhythm; no murmurs Abd:      + bowel sounds; soft, non-tender; no palpable masses, no distension Ext:    No edema; adequate peripheral perfusion Skin:      Warm and dry; no rash Neuro: alert and oriented x 3 Psych: normal mood  and affect  Data Reviewed: Bronchoscopy culture 10/07/16-  Pseudomonas aeruginosa Strep viridans MAI  Echocardiogram 10/12/14 Apical hypokinesis with mildly reduced LV systolic function; mild  LVH; sclerotic aortic valve with mild AI; mild MR; mild biatrial  enlargement; mild RVE; mild TR; mildly elevated pulmonary  pressure.  CT chest 10/05/16 - trace right effusion, multiple rib fractures on R, extensive consolidative changes and RML / LL and LL with narrowing of right mainstem bronchus. Subcarinal lymph node.  CT angiogram 01/02/17-no pulmonary medicine, improved infiltrates in the right lower lobe and right middle lobe. Scattered atelectasis, bronchial wall thickening, small effusions CT chest 04/22/17-minimal pleural effusion, lower lobe opacities consistent with atelectasis with scattered groundglass. I had reviewed the images personally.  PFTs 03/24/17 FVC 2.19 (62%], FEV1 1.61 [54%], F/F 73, TLC 92%, RV/TLC 141%, DLCO 44% Moderate severe obstruction with air trapping, severe diffusion defect.  CBC differential 04/21/17- absolute eosinophil count < 150  Swallow eval 04/22/17-mild to moderate oropharyngeal dysphagia  Assessment:  COPD Recurrent respiratory failure with pneumonia. Given her recurrent exacerbations with pneumonia we will stop inhaled corticosteroid and start anoro inhaler Absolute eosinophil count noted to be low in the past so she will not need the inhaled steroid Continue on prednisone at 10 mg which she is on for polymyalgia rheumatica. She was ambulated in clinic with no desats. We will continue to monitor.  Positive MAI on BAL Bronchoscopy in early 2018 showed MAI.  We had elected not to treat her given her age and frailty.  Repeat her sputum for AFB and regular cultures.   Chronic aspiration Swallow study noted with mild to moderate oropharyngeal dysphagia.  We will send recommendations from the swallow eval to her nursing home so that they can follow  aspiration precautions.  End of life care Seen by palliative care during her recurrent admissions and follow-up by palliative services recommended in her nursing home. I agree with the recommendation. Apparently the referral has not been done yet by [facility]. I will check with with the Dr. Rowe Pavy, [facility] service if they will continue to follow her or if a referral needs to be placed.  Code status-DNR  Plan/Recommendations: - Start anoro - Aspiration precautions. Instructions sent to nursing home - Sputum for AFB, cultures - Clarify referral to palliative service.   Marshell Garfinkel MD [facility] Pager [phone] 05/02/2017, 9:45  AM  CC: Stoneking, Christiane Ha, MD  Addendum: Overnight oximetry 05/16/17 Total time 4 hours O2 sat 90% average, low 88%. Pulse 73 average, highest level recorded 109. There are no desaturations.  No oxygen required.

## 2017-05-02 NOTE — Patient Instructions (Addendum)
We will get in touch with the palliative care physician to see if a follow-up can be made We will print out the results of your swallow study.  We will send this to the nursing facility to make sure that they follow instructions to prevent recurrent aspiration Continue the anoro Your oxygen levels were okay on exertion today.  We will check an overnight oximetry to make sure there are no desats at night We will check sputum for cultures, AFB.  Follow-up in 3 months.

## 2017-05-03 ENCOUNTER — Ambulatory Visit (INDEPENDENT_AMBULATORY_CARE_PROVIDER_SITE_OTHER): Payer: Medicare Other | Admitting: *Deleted

## 2017-05-03 ENCOUNTER — Telehealth: Payer: Self-pay | Admitting: Cardiology

## 2017-05-03 DIAGNOSIS — I480 Paroxysmal atrial fibrillation: Secondary | ICD-10-CM | POA: Diagnosis not present

## 2017-05-03 DIAGNOSIS — I5032 Chronic diastolic (congestive) heart failure: Secondary | ICD-10-CM | POA: Diagnosis not present

## 2017-05-03 DIAGNOSIS — J449 Chronic obstructive pulmonary disease, unspecified: Secondary | ICD-10-CM | POA: Diagnosis not present

## 2017-05-03 DIAGNOSIS — M353 Polymyalgia rheumatica: Secondary | ICD-10-CM | POA: Diagnosis not present

## 2017-05-03 DIAGNOSIS — I495 Sick sinus syndrome: Secondary | ICD-10-CM | POA: Diagnosis not present

## 2017-05-03 DIAGNOSIS — I10 Essential (primary) hypertension: Secondary | ICD-10-CM | POA: Diagnosis not present

## 2017-05-03 DIAGNOSIS — I951 Orthostatic hypotension: Secondary | ICD-10-CM | POA: Diagnosis not present

## 2017-05-03 NOTE — Telephone Encounter (Signed)
Confirmed remote transmission w/ pt daughter.   

## 2017-05-03 NOTE — Addendum Note (Signed)
Addended by: Maryanna Shape A on: 05/03/2017 09:44 AM   Modules accepted: Orders

## 2017-05-04 LAB — CUP PACEART REMOTE DEVICE CHECK
Battery Remaining Longevity: 1 mo
Battery Remaining Percentage: 0.5 %
Brady Statistic AP VS Percent: 1 %
Brady Statistic AS VP Percent: 7.6 %
Brady Statistic AS VS Percent: 1 %
Brady Statistic RV Percent Paced: 99 %
Implantable Lead Implant Date: 19920213
Implantable Lead Serial Number: 23000302
Lead Channel Impedance Value: 350 Ohm
Lead Channel Pacing Threshold Amplitude: 0.5 V
Lead Channel Pacing Threshold Pulse Width: 0.4 ms
Lead Channel Sensing Intrinsic Amplitude: 12 mV
Lead Channel Sensing Intrinsic Amplitude: 2.7 mV
Lead Channel Setting Pacing Amplitude: 0.75 V
Lead Channel Setting Pacing Amplitude: 2.25 V
Lead Channel Setting Pacing Pulse Width: 0.4 ms
MDC IDC LEAD IMPLANT DT: 19920213
MDC IDC LEAD LOCATION: 753859
MDC IDC LEAD LOCATION: 753860
MDC IDC MSMT BATTERY VOLTAGE: 2.6 V
MDC IDC MSMT LEADCHNL RA PACING THRESHOLD AMPLITUDE: 0.75 V
MDC IDC MSMT LEADCHNL RA PACING THRESHOLD PULSEWIDTH: 0.4 ms
MDC IDC MSMT LEADCHNL RV IMPEDANCE VALUE: 430 Ohm
MDC IDC PG IMPLANT DT: 20101008
MDC IDC PG SERIAL: 2314326
MDC IDC SESS DTM: 20181114234856
MDC IDC SET LEADCHNL RV SENSING SENSITIVITY: 4 mV
MDC IDC STAT BRADY AP VP PERCENT: 92 %
MDC IDC STAT BRADY RA PERCENT PACED: 88 %
Pulse Gen Model: 2110

## 2017-05-04 NOTE — Addendum Note (Signed)
Addended by: Maryanna Shape A on: 05/04/2017 02:34 PM   Modules accepted: Orders

## 2017-05-04 NOTE — Progress Notes (Signed)
Remote pacemaker transmission.   

## 2017-05-05 ENCOUNTER — Encounter: Payer: Self-pay | Admitting: Cardiology

## 2017-05-05 DIAGNOSIS — J9601 Acute respiratory failure with hypoxia: Secondary | ICD-10-CM | POA: Diagnosis not present

## 2017-05-05 DIAGNOSIS — J441 Chronic obstructive pulmonary disease with (acute) exacerbation: Secondary | ICD-10-CM | POA: Diagnosis not present

## 2017-05-05 LAB — RESPIRATORY CULTURE OR RESPIRATORY AND SPUTUM CULTURE
MICRO NUMBER: 81277715
SPECIMEN QUALITY: ADEQUATE

## 2017-05-09 DIAGNOSIS — R531 Weakness: Secondary | ICD-10-CM | POA: Diagnosis not present

## 2017-05-16 ENCOUNTER — Encounter: Payer: Self-pay | Admitting: Pulmonary Disease

## 2017-05-16 ENCOUNTER — Telehealth: Payer: Self-pay | Admitting: Pulmonary Disease

## 2017-05-16 DIAGNOSIS — J449 Chronic obstructive pulmonary disease, unspecified: Secondary | ICD-10-CM | POA: Diagnosis not present

## 2017-05-16 DIAGNOSIS — R0902 Hypoxemia: Secondary | ICD-10-CM | POA: Diagnosis not present

## 2017-05-16 NOTE — Telephone Encounter (Signed)
Called and spoke to [place] with Palliative care. Vaughan Basta wanted to make PM aware that pt had Palliative care consult on 05/09/17.  Will route to PM to make aware.

## 2017-05-17 DIAGNOSIS — X32XXXD Exposure to sunlight, subsequent encounter: Secondary | ICD-10-CM | POA: Diagnosis not present

## 2017-05-17 DIAGNOSIS — Z08 Encounter for follow-up examination after completed treatment for malignant neoplasm: Secondary | ICD-10-CM | POA: Diagnosis not present

## 2017-05-17 DIAGNOSIS — L570 Actinic keratosis: Secondary | ICD-10-CM | POA: Diagnosis not present

## 2017-05-17 DIAGNOSIS — S81812A Laceration without foreign body, left lower leg, initial encounter: Secondary | ICD-10-CM | POA: Diagnosis not present

## 2017-05-17 DIAGNOSIS — L821 Other seborrheic keratosis: Secondary | ICD-10-CM | POA: Diagnosis not present

## 2017-05-17 DIAGNOSIS — Z85828 Personal history of other malignant neoplasm of skin: Secondary | ICD-10-CM | POA: Diagnosis not present

## 2017-05-17 NOTE — Telephone Encounter (Signed)
Noted. Thanks.

## 2017-05-18 DIAGNOSIS — J9601 Acute respiratory failure with hypoxia: Secondary | ICD-10-CM | POA: Diagnosis not present

## 2017-05-18 DIAGNOSIS — J441 Chronic obstructive pulmonary disease with (acute) exacerbation: Secondary | ICD-10-CM | POA: Diagnosis not present

## 2017-05-25 DIAGNOSIS — R531 Weakness: Secondary | ICD-10-CM | POA: Diagnosis not present

## 2017-05-30 DIAGNOSIS — I10 Essential (primary) hypertension: Secondary | ICD-10-CM | POA: Diagnosis not present

## 2017-05-30 DIAGNOSIS — I480 Paroxysmal atrial fibrillation: Secondary | ICD-10-CM | POA: Diagnosis not present

## 2017-05-30 DIAGNOSIS — I5030 Unspecified diastolic (congestive) heart failure: Secondary | ICD-10-CM | POA: Diagnosis not present

## 2017-05-30 DIAGNOSIS — J449 Chronic obstructive pulmonary disease, unspecified: Secondary | ICD-10-CM | POA: Diagnosis not present

## 2017-06-02 ENCOUNTER — Ambulatory Visit (INDEPENDENT_AMBULATORY_CARE_PROVIDER_SITE_OTHER): Payer: Self-pay | Admitting: *Deleted

## 2017-06-02 ENCOUNTER — Telehealth: Payer: Self-pay | Admitting: Cardiology

## 2017-06-02 DIAGNOSIS — I495 Sick sinus syndrome: Secondary | ICD-10-CM

## 2017-06-02 NOTE — Telephone Encounter (Signed)
Confirmed remote transmission w/ pt daughter.   

## 2017-06-05 NOTE — Progress Notes (Signed)
Remote pacemaker transmission.   

## 2017-06-06 LAB — CUP PACEART REMOTE DEVICE CHECK
Battery Remaining Longevity: 1 mo
Battery Remaining Percentage: 0.5 %
Battery Voltage: 2.6 V
Brady Statistic AP VS Percent: 1 %
Brady Statistic RA Percent Paced: 88 %
Brady Statistic RV Percent Paced: 99 %
Implantable Lead Implant Date: 19920213
Implantable Lead Implant Date: 19920213
Implantable Lead Location: 753860
Implantable Pulse Generator Implant Date: 20101008
Lead Channel Impedance Value: 340 Ohm
Lead Channel Pacing Threshold Amplitude: 0.625 V
Lead Channel Pacing Threshold Amplitude: 0.75 V
Lead Channel Pacing Threshold Pulse Width: 0.4 ms
Lead Channel Sensing Intrinsic Amplitude: 1.6 mV
Lead Channel Setting Sensing Sensitivity: 4 mV
MDC IDC LEAD LOCATION: 753859
MDC IDC LEAD SERIAL: 23000302
MDC IDC MSMT LEADCHNL RV IMPEDANCE VALUE: 400 Ohm
MDC IDC MSMT LEADCHNL RV PACING THRESHOLD PULSEWIDTH: 0.4 ms
MDC IDC MSMT LEADCHNL RV SENSING INTR AMPL: 12 mV
MDC IDC PG SERIAL: 2314326
MDC IDC SESS DTM: 20181214175234
MDC IDC SET LEADCHNL RA PACING AMPLITUDE: 2.25 V
MDC IDC SET LEADCHNL RV PACING AMPLITUDE: 0.875
MDC IDC SET LEADCHNL RV PACING PULSEWIDTH: 0.4 ms
MDC IDC STAT BRADY AP VP PERCENT: 92 %
MDC IDC STAT BRADY AS VP PERCENT: 7.5 %
MDC IDC STAT BRADY AS VS PERCENT: 1 %
Pulse Gen Model: 2110

## 2017-06-07 ENCOUNTER — Encounter: Payer: Self-pay | Admitting: Cardiology

## 2017-06-07 DIAGNOSIS — R531 Weakness: Secondary | ICD-10-CM | POA: Diagnosis not present

## 2017-06-16 DIAGNOSIS — I5030 Unspecified diastolic (congestive) heart failure: Secondary | ICD-10-CM | POA: Diagnosis not present

## 2017-06-16 DIAGNOSIS — M6281 Muscle weakness (generalized): Secondary | ICD-10-CM | POA: Diagnosis not present

## 2017-06-16 DIAGNOSIS — R262 Difficulty in walking, not elsewhere classified: Secondary | ICD-10-CM | POA: Diagnosis not present

## 2017-06-17 LAB — MYCOBACTERIA,CULT W/FLUOROCHROME SMEAR
MICRO NUMBER: 81277717
SMEAR:: NONE SEEN
SPECIMEN QUALITY:: ADEQUATE

## 2017-06-19 DIAGNOSIS — M6281 Muscle weakness (generalized): Secondary | ICD-10-CM | POA: Diagnosis not present

## 2017-06-19 DIAGNOSIS — I5030 Unspecified diastolic (congestive) heart failure: Secondary | ICD-10-CM | POA: Diagnosis not present

## 2017-06-19 DIAGNOSIS — R262 Difficulty in walking, not elsewhere classified: Secondary | ICD-10-CM | POA: Diagnosis not present

## 2017-06-22 DIAGNOSIS — I5030 Unspecified diastolic (congestive) heart failure: Secondary | ICD-10-CM | POA: Diagnosis not present

## 2017-06-22 DIAGNOSIS — R262 Difficulty in walking, not elsewhere classified: Secondary | ICD-10-CM | POA: Diagnosis not present

## 2017-06-22 DIAGNOSIS — M6281 Muscle weakness (generalized): Secondary | ICD-10-CM | POA: Diagnosis not present

## 2017-06-23 DIAGNOSIS — I5030 Unspecified diastolic (congestive) heart failure: Secondary | ICD-10-CM | POA: Diagnosis not present

## 2017-06-23 DIAGNOSIS — M6281 Muscle weakness (generalized): Secondary | ICD-10-CM | POA: Diagnosis not present

## 2017-06-23 DIAGNOSIS — R262 Difficulty in walking, not elsewhere classified: Secondary | ICD-10-CM | POA: Diagnosis not present

## 2017-06-26 DIAGNOSIS — I5030 Unspecified diastolic (congestive) heart failure: Secondary | ICD-10-CM | POA: Diagnosis not present

## 2017-06-26 DIAGNOSIS — R262 Difficulty in walking, not elsewhere classified: Secondary | ICD-10-CM | POA: Diagnosis not present

## 2017-06-26 DIAGNOSIS — M6281 Muscle weakness (generalized): Secondary | ICD-10-CM | POA: Diagnosis not present

## 2017-06-28 DIAGNOSIS — M6281 Muscle weakness (generalized): Secondary | ICD-10-CM | POA: Diagnosis not present

## 2017-06-28 DIAGNOSIS — R262 Difficulty in walking, not elsewhere classified: Secondary | ICD-10-CM | POA: Diagnosis not present

## 2017-06-28 DIAGNOSIS — I5030 Unspecified diastolic (congestive) heart failure: Secondary | ICD-10-CM | POA: Diagnosis not present

## 2017-06-29 DIAGNOSIS — R262 Difficulty in walking, not elsewhere classified: Secondary | ICD-10-CM | POA: Diagnosis not present

## 2017-06-29 DIAGNOSIS — M6281 Muscle weakness (generalized): Secondary | ICD-10-CM | POA: Diagnosis not present

## 2017-06-29 DIAGNOSIS — I5030 Unspecified diastolic (congestive) heart failure: Secondary | ICD-10-CM | POA: Diagnosis not present

## 2017-07-03 ENCOUNTER — Ambulatory Visit: Payer: Medicare Other | Admitting: Pulmonary Disease

## 2017-07-03 DIAGNOSIS — R262 Difficulty in walking, not elsewhere classified: Secondary | ICD-10-CM | POA: Diagnosis not present

## 2017-07-03 DIAGNOSIS — I5030 Unspecified diastolic (congestive) heart failure: Secondary | ICD-10-CM | POA: Diagnosis not present

## 2017-07-03 DIAGNOSIS — M6281 Muscle weakness (generalized): Secondary | ICD-10-CM | POA: Diagnosis not present

## 2017-07-04 ENCOUNTER — Encounter: Payer: Self-pay | Admitting: Pulmonary Disease

## 2017-07-05 DIAGNOSIS — I5030 Unspecified diastolic (congestive) heart failure: Secondary | ICD-10-CM | POA: Diagnosis not present

## 2017-07-05 DIAGNOSIS — M6281 Muscle weakness (generalized): Secondary | ICD-10-CM | POA: Diagnosis not present

## 2017-07-05 DIAGNOSIS — R262 Difficulty in walking, not elsewhere classified: Secondary | ICD-10-CM | POA: Diagnosis not present

## 2017-07-06 ENCOUNTER — Ambulatory Visit (INDEPENDENT_AMBULATORY_CARE_PROVIDER_SITE_OTHER): Payer: Self-pay | Admitting: *Deleted

## 2017-07-06 DIAGNOSIS — I495 Sick sinus syndrome: Secondary | ICD-10-CM

## 2017-07-06 NOTE — Progress Notes (Signed)
Remote pacemaker transmission.   

## 2017-07-07 ENCOUNTER — Encounter: Payer: Self-pay | Admitting: Cardiology

## 2017-07-07 ENCOUNTER — Emergency Department (HOSPITAL_COMMUNITY)
Admission: EM | Admit: 2017-07-07 | Discharge: 2017-07-07 | Disposition: A | Discharge status: Home / Self Care | Payer: Medicare Other | Attending: Emergency Medicine | Admitting: Emergency Medicine

## 2017-07-07 ENCOUNTER — Encounter (HOSPITAL_COMMUNITY): Payer: Self-pay | Admitting: Emergency Medicine

## 2017-07-07 ENCOUNTER — Emergency Department (HOSPITAL_COMMUNITY): Payer: Medicare Other

## 2017-07-07 DIAGNOSIS — S81819A Laceration without foreign body, unspecified lower leg, initial encounter: Secondary | ICD-10-CM | POA: Diagnosis not present

## 2017-07-07 DIAGNOSIS — R262 Difficulty in walking, not elsewhere classified: Secondary | ICD-10-CM | POA: Diagnosis not present

## 2017-07-07 DIAGNOSIS — Y998 Other external cause status: Secondary | ICD-10-CM | POA: Diagnosis not present

## 2017-07-07 DIAGNOSIS — Z79899 Other long term (current) drug therapy: Secondary | ICD-10-CM | POA: Insufficient documentation

## 2017-07-07 DIAGNOSIS — Y9389 Activity, other specified: Secondary | ICD-10-CM | POA: Insufficient documentation

## 2017-07-07 DIAGNOSIS — J449 Chronic obstructive pulmonary disease, unspecified: Secondary | ICD-10-CM | POA: Insufficient documentation

## 2017-07-07 DIAGNOSIS — Z950 Presence of cardiac pacemaker: Secondary | ICD-10-CM | POA: Insufficient documentation

## 2017-07-07 DIAGNOSIS — M6281 Muscle weakness (generalized): Secondary | ICD-10-CM | POA: Diagnosis not present

## 2017-07-07 DIAGNOSIS — I5030 Unspecified diastolic (congestive) heart failure: Secondary | ICD-10-CM | POA: Diagnosis not present

## 2017-07-07 DIAGNOSIS — S8012XA Contusion of left lower leg, initial encounter: Secondary | ICD-10-CM | POA: Diagnosis not present

## 2017-07-07 DIAGNOSIS — F0390 Unspecified dementia without behavioral disturbance: Secondary | ICD-10-CM | POA: Insufficient documentation

## 2017-07-07 DIAGNOSIS — W2209XA Striking against other stationary object, initial encounter: Secondary | ICD-10-CM | POA: Diagnosis not present

## 2017-07-07 DIAGNOSIS — Z7901 Long term (current) use of anticoagulants: Secondary | ICD-10-CM | POA: Insufficient documentation

## 2017-07-07 DIAGNOSIS — S81812A Laceration without foreign body, left lower leg, initial encounter: Secondary | ICD-10-CM | POA: Diagnosis not present

## 2017-07-07 DIAGNOSIS — I5032 Chronic diastolic (congestive) heart failure: Secondary | ICD-10-CM | POA: Insufficient documentation

## 2017-07-07 DIAGNOSIS — T148XXA Other injury of unspecified body region, initial encounter: Secondary | ICD-10-CM | POA: Diagnosis not present

## 2017-07-07 DIAGNOSIS — Z87891 Personal history of nicotine dependence: Secondary | ICD-10-CM | POA: Diagnosis not present

## 2017-07-07 DIAGNOSIS — Y92511 Restaurant or cafe as the place of occurrence of the external cause: Secondary | ICD-10-CM | POA: Diagnosis not present

## 2017-07-07 DIAGNOSIS — Z23 Encounter for immunization: Secondary | ICD-10-CM | POA: Insufficient documentation

## 2017-07-07 DIAGNOSIS — S8992XA Unspecified injury of left lower leg, initial encounter: Secondary | ICD-10-CM | POA: Diagnosis not present

## 2017-07-07 DIAGNOSIS — S81802A Unspecified open wound, left lower leg, initial encounter: Secondary | ICD-10-CM | POA: Diagnosis not present

## 2017-07-07 MED ORDER — TETANUS-DIPHTH-ACELL PERTUSSIS 5-2.5-18.5 LF-MCG/0.5 IM SUSP
0.5000 mL | Freq: Once | INTRAMUSCULAR | Status: AC
  Administered 2017-07-07: 0.5 mL via INTRAMUSCULAR
  Filled 2017-07-07: qty 0.5

## 2017-07-07 MED ORDER — LIDOCAINE-EPINEPHRINE (PF) 2 %-1:200000 IJ SOLN
10.0000 mL | Freq: Once | INTRAMUSCULAR | Status: AC
  Administered 2017-07-07: 10 mL
  Filled 2017-07-07: qty 20

## 2017-07-07 NOTE — ED Provider Notes (Signed)
[facility] DEPT Provider Note   CSN: [id number] Arrival date & time: 07/07/17  1943     History   Chief Complaint Chief Complaint  Patient presents with  . Laceration    HPI [name] is a 84 y.o. female with a PMHx of complete heart block s/p pacemaker insertion, CHF, COPD, orthostatic hypotension, PAF on eliquis, sick sinus syndrome, HTN, polymyalgia rheumatica, and other conditions listed below, who presents to the ED with complaints of left leg laceration sustained about 1 hour ago.  Patient states that she was turning the corner at [employer] when she accidentally hit her leg against the corner of a bench and caused a C-shaped skin tear to her left lower leg.  She complains of 3/10 constant throbbing nonradiating pain around the wound on her left lower leg, with no known aggravating or alleviating factors, no treatments tried prior to arrival.  She reports having a small bruise around the wound as well.  Bleeding has been controlled with a bandage.  She denies falling, hitting her head or losing consciousness.  She denies any numbness, tingling, focal weakness, swelling, or any other complaints at this time.  She is unsure of her last tetanus shot but states that her daughter is on the way and would know this information better than she does.  ADDITIONAL INFORMATION RECEIVED FROM DAUGHTER: Last Tdap was Dec 2009.    The history is provided by the patient and medical records. No language interpreter was used.  Laceration   The incident occurred less than 1 hour ago. The laceration is located on the left leg. The laceration is 5 cm in size. Injury mechanism: sharp corner of bench. The pain is at a severity of 3/10. The pain is mild. The pain has been constant since onset. She reports no foreign bodies present. Her tetanus status is out of date.    Past Medical History:  Diagnosis Date  . CHB (complete heart block) ([place]) 07/13/2013   Pacemaker dependent   . CHF (congestive heart failure) ([place])   . COPD (chronic obstructive pulmonary disease) ([place])   . DM (dermatomyositis)   . Orthostatic hypotension   . Pacemaker 07/13/2013   Her original pacemaker and the current leads were implanted in 1992. She has had 2 generator change out, most recently in 2010. Her device is a Buyer, retail 2110 non RF dual-chamber pacemaker with a battery longevity estimated at about 7 years. The atrial lead is a St. Jude [id number] and the ventricular lead was a Biotronik [id number].   [name] Paroxysmal atrial fibrillation ([place]) 09/04/2014   on Eliquis  . Polymyalgia rheumatica ([place])   . Scoliosis   . SSS (sick sinus syndrome) ([place]) 07/13/2013  . Syncope   . Systemic hypertension   . Thoracic kyphosis     Patient Active Problem List   Diagnosis Date Noted  . Chronic pulmonary aspiration 05/02/2017  . Encounter for palliative care   . Goals of care, counseling/discussion   . Shortness of breath   . Stress fracture of thoracic vertebra 03/19/2017  . Dementia 03/19/2017  . Non-traumatic compression fracture of T9 thoracic vertebra ([place]) 01/02/2017  . Polymyalgia rheumatica ([place])   . Postural dizziness with near syncope   . Syncope 10/11/2016  . Postural dizziness with presyncope 10/11/2016  . Acute hypoxemic respiratory failure ([place]) 10/05/2016  . Chronic diastolic CHF (congestive heart failure) ([place]) 10/05/2016  . Smoker 10/05/2016  . Closed multiple fractures of right upper extremity with ribs  with routine healing 10/05/2016  . HCAP (healthcare-associated pneumonia) 10/31/2015  . COPD (chronic obstructive pulmonary disease) ([place]) 10/31/2015  . Leukocytosis 10/31/2015  . Benign essential HTN 10/31/2015  . Anxiety state   . COPD exacerbation ([place]) 08/26/2015  . Systemic hypertension 08/26/2015  . Hypokalemia 08/26/2015  . Anticoagulant long-term use 08/26/2015  . DM (dermatomyositis) 08/26/2015  . Acute on chronic diastolic CHF (congestive heart failure), NYHA  class 1 ([place])   . Sepsis ([place]) 06/07/2015  . CAP (community acquired pneumonia) 06/07/2015  . Acute CHF (congestive heart failure) ([place]) 06/07/2015  . Paroxysmal atrial fibrillation ([place]) 09/04/2014  . Pacemaker 07/13/2013  . CHB (complete heart block) ([place]) 07/13/2013  . SSS (sick sinus syndrome) ([place]) 07/13/2013  . Orthostatic hypotension 07/13/2013  . Aortic insufficiency 07/13/2013    Past Surgical History:  Procedure Laterality Date  . IR KYPHO THORACIC WITH BONE BIOPSY  01/04/2017  . IR RADIOLOGIST EVAL & MGMT  01/10/2017  . NM MYOCAR PERF WALL MOTION  09/13/2011   Low risk  . PERMANENT PACEMAKER GENERATOR CHANGE  03/27/2009   [employer].Jude  . US ECHOCARDIOGRAPHY  03/28/2012   Mod LAE,mild MR,aortic sclerosis w/mod AI,mod. TR,mild PI,Stage I diastolic dysfunction  . VIDEO BRONCHOSCOPY Bilateral 10/07/2016   Procedure: VIDEO BRONCHOSCOPY WITH FLUORO;  Surgeon: Marshell Garfinkel, MD;  Location: [facility];  Service: Cardiopulmonary;  Laterality: Bilateral;    OB History    No data available       Home Medications    Prior to Admission medications   Medication Sig Start Date End Date Taking? Authorizing Provider  acetaminophen (TYLENOL) 325 MG tablet Take 325 mg by mouth every 6 (six) hours as needed for mild pain, moderate pain or fever.     [provider]  albuterol (PROVENTIL) (2.5 MG/3ML) 0.083% nebulizer solution Take 3 mLs (2.5 mg total) by nebulization every 6 (six) hours as needed for wheezing or shortness of breath. 04/04/17   Hosie Poisson, MD  alendronate (FOSAMAX) 70 MG tablet Take 70 mg by mouth every Saturday. Take with a full glass of water on an empty stomach.    [provider]  ALPRAZolam Duanne Moron) 0.25 MG tablet Take 1 tablet (0.25 mg total) at bedtime as needed by mouth for sleep. 04/25/17   Bonnielee Haff, MD  apixaban (ELIQUIS) 2.5 MG TABS tablet Take 1 tablet (2.5 mg total) by mouth 2 (two) times daily. 03/29/17   Croitoru, Mihai, MD  docusate  sodium (COLACE) 100 MG capsule Take 1 capsule (100 mg total) daily as needed by mouth for mild constipation. 04/25/17   Bonnielee Haff, MD  escitalopram (LEXAPRO) 10 MG tablet Take 10 mg by mouth daily.    [provider]  furosemide (LASIX) 40 MG tablet Take 0.5 tablets (20 mg total) daily by mouth. 04/25/17   Bonnielee Haff, MD  HYDROcodone-acetaminophen (NORCO/VICODIN) 5-325 MG tablet Take 1-2 tablets every 4 (four) hours as needed by mouth for moderate pain. 04/25/17   Bonnielee Haff, MD  Ipratropium-Albuterol (COMBIVENT RESPIMAT) 20-100 MCG/ACT AERS respimat Inhale 1 puff into the lungs every 6 (six) hours as needed for wheezing or shortness of breath. 08/29/15   Barton Dubois, MD  metoprolol succinate (TOPROL-XL) 25 MG 24 hr tablet Take 25 mg by mouth daily.     [provider]  midodrine (PROAMATINE) 2.5 MG tablet Take 2.5 mg by mouth as needed (when systolic BP is lower than 100).     [provider]  mirtazapine (REMERON) 15 MG tablet Take 7.5 mg  by mouth at bedtime.     [provider]  potassium chloride SA (K-DUR,KLOR-CON) 20 MEQ tablet Take 1 tablet (20 mEq total) daily by mouth. 04/25/17   Bonnielee Haff, MD  predniSONE (DELTASONE) 10 MG tablet Take 6 tablets once daily for 3 days, then take 4 tablets once daily for 3 days, then take 2 tablets once daily for 3 days and then 1 tablet once daily as before. 04/25/17   Bonnielee Haff, MD  umeclidinium-vilanterol [facility] ELLIPTA) 62.5-25 MCG/INH AEPB Inhale 1 puff daily into the lungs. 04/26/17   Marshell Garfinkel, MD    Family History Family History  Problem Relation Age of Onset  . Stroke Mother     Social History Social History   Tobacco Use  . Smoking status: Former Smoker    Packs/day: 0.50    Years: 60.00    Pack years: 30.00    Types: Cigarettes  . Smokeless tobacco: Never Used  . Tobacco comment: quit 12/2016  Substance Use Topics  . Alcohol use: No  . Drug use: No     Allergies     Patient has no known allergies.   Review of Systems Review of Systems  HENT: Negative for facial swelling (no head inj).   Musculoskeletal: Positive for myalgias. Negative for joint swelling.  Skin: Positive for color change and wound.  Allergic/Immunologic: Negative for immunocompromised state.  Neurological: Negative for syncope, weakness and numbness.  Hematological: Bruises/bleeds easily (on eliquis).     Physical Exam Updated Vital Signs BP (!) 131/50 (BP Location: Left Arm)   Pulse 75   Temp 99 F (37.2 C) (Oral)   Resp 16   SpO2 95%   Physical Exam  Constitutional: She is oriented to person, place, and time. Vital signs are normal. She appears well-developed and well-nourished.  Non-toxic appearance. No distress.  Afebrile, nontoxic, NAD  HENT:  Head: Normocephalic and atraumatic.  Mouth/Throat: Mucous membranes are normal.  Eyes: Conjunctivae and EOM are normal. Right eye exhibits no discharge. Left eye exhibits no discharge.  Neck: Normal range of motion. Neck supple.  Cardiovascular: Normal rate and intact distal pulses.  Pulmonary/Chest: Effort normal. No respiratory distress.  Abdominal: Normal appearance. She exhibits no distension.  Musculoskeletal: Normal range of motion.       Left lower leg: She exhibits tenderness and laceration. She exhibits no swelling, no edema and no deformity.       Legs: L lateral lower leg with C-shaped skin tear measuring ~4-5cm in total length, with ~0.5cm gap between wound edges in the middle of the C, no ongoing bleeding, no obvious retained FBs, subQ fat slightly visible but does not go deeper into the musculature or fascia, small bruise around the wound, no significant swelling, mildly TTP diffusely around the wound, but no focal bony TTP to the ankle/knee. FROM intact in all major joints of the LLE. No crepitus or deformity. Strength and sensation grossly intact, distal pulses intact, soft compartments.  SEE PICTURE BELOW   Neurological: She is alert and oriented to person, place, and time. She has normal strength. No sensory deficit.  Skin: Skin is warm and dry. Laceration noted. No rash noted.  L leg lac as mentioned above and pictured below  Psychiatric: She has a normal mood and affect. Her behavior is normal.  Nursing note and vitals reviewed.      ED Treatments / Results  Labs (all labs ordered are listed, but only abnormal results are displayed) Labs Reviewed - No data to  display  EKG  EKG Interpretation None       Radiology Dg Tibia/fibula Left  Result Date: 07/07/2017 CLINICAL DATA:  Acute left lower leg injury with lacerations EXAM: LEFT TIBIA AND FIBULA - 2 VIEW COMPARISON:  None. FINDINGS: No acute fracture, subluxation or dislocation. No radiopaque foreign bodies noted. Degenerative changes and chondrocalcinosis within the left knee noted. IMPRESSION: No evidence of acute abnormality. Electronically Signed   By: Margarette Canada M.D.   On: 07/07/2017 20:53    Procedures .Marland KitchenLaceration Repair Date/Time: 07/07/2017 9:04 PM Performed by: Reece Agar, PA-C Authorized by: Reece Agar, [place]   Consent:    Consent obtained:  Verbal   Consent given by:  Patient   Risks discussed:  Infection, pain, poor cosmetic result and poor wound healing   Alternatives discussed:  No treatment and observation Anesthesia (see MAR for exact dosages):    Anesthesia method:  Local infiltration   Local anesthetic:  Lidocaine 2% WITH epi Laceration details:    Location:  Leg   Leg location:  L lower leg   Length (cm):  5   Depth (mm):  2 Repair type:    Repair type:  Simple Pre-procedure details:    Preparation:  Patient was prepped and draped in usual sterile fashion and imaging obtained to evaluate for foreign bodies Exploration:    Hemostasis achieved with:  Direct pressure   Wound exploration: wound explored through full range of motion and entire depth of wound probed and visualized      Wound extent: no foreign bodies/material noted, no muscle damage noted and no underlying fracture noted     Contaminated: no   Treatment:    Area cleansed with:  Saline   Amount of cleaning:  Standard   Irrigation solution:  Sterile saline   Irrigation method:  Syringe Skin repair:    Repair method:  Sutures   Suture size:  4-0   Suture material:  Prolene   Suture technique:  Simple interrupted   Number of sutures:  3 Approximation:    Approximation:  Close   Vermilion border: well-aligned   Post-procedure details:    Dressing:  Non-adherent dressing and antibiotic ointment   Patient tolerance of procedure:  Tolerated well, no immediate complications   (including critical care time)  Medications Ordered in ED Medications  lidocaine-EPINEPHrine (XYLOCAINE W/EPI) 2 %-1:200000 (PF) injection 10 mL (10 mLs Infiltration Given by Other 07/07/17 2030)  Tdap (BOOSTRIX) injection 0.5 mL (0.5 mLs Intramuscular Given 07/07/17 2131)     Initial Impression / Assessment and Plan / ED Course  I have reviewed the triage vital signs and the nursing notes.  Pertinent labs & imaging results that were available during my care of the patient were reviewed by me and considered in my medical decision making (see chart for details).     84 y.o. female here with C-shaped skin tear/laceration to L lower leg after striking it on a sharp corner of a bench at a restaurant ~1hr PTA.  On Eliquis but bleeding has been controlled with a bandage. On exam, ~4-5cm in total length C-shaped skin tear with ~0.5cm gap between wound edges in the middle, no ongoing bleeding, no obvious retained FBs, subQ fat slightly visible but does not go deeper into the musculature, small bruise around the wound, no significant swelling, mildly TTP. Leg NVI with soft compartments. Will attempt to place a stitch in the middle to pull the skin together, but skin is so delicate that this may not  hold; will likely need steri strips to assist  with closing the tear, just to expedite healing since it's not a very deep or large wound. Will hold off on Tdap until her daughter arrives and can tell us if it's UTD or not. Pt declines wanting anything for pain. Will xray leg to ensure no underlying injury, and reassess shortly.   9:33 PM Xray negative. Wound repaired with 4-0 prolene sutures x3 simple interrupted, skin held together well with the sutures so we used those instead of using steri strips. Son in law arrived and informs [place] that pt is out of date with Tdap, last had one in Dec 2009; will update this today before she leaves. Advised proper wound care, doubt need for ppx abx, discussed OTC remedies for pain control, RICE advised, and f/up with PCP in 3 days for wound check, and 7 days for suture removal. Discussed case with my attending Dr. Lacinda Axon who agrees with plan. I explained the diagnosis and have given explicit precautions to return to the ER including for any other new or worsening symptoms. The patient understands and accepts the medical plan as it's been dictated and I have answered their questions. Discharge instructions concerning home care and prescriptions have been given. The patient is STABLE and is discharged to home in good condition.    Final Clinical Impressions(s) / ED Diagnoses   Final diagnoses:  Laceration of left lower extremity, initial encounter  Skin tear of left lower leg without complication, initial encounter    ED Discharge Orders    [address] [place], [place] 07/07/17 2145    Nat Christen, MD 07/08/17 1820

## 2017-07-07 NOTE — ED Notes (Signed)
Bed: WTR6 Expected date:  Expected time:  Means of arrival:  Comments: EMS 84 yo female from restaurant-caught leg on wall-lac 2 inch left leg

## 2017-07-07 NOTE — Discharge Instructions (Addendum)
Keep wound clean with mild soap and water at least twice daily. Keep area covered with a topical antibiotic ointment and bandage, keep bandage dry, and do not submerge in water for 24 hours. You may shower normally, but don't take baths or submerge yourself in water while the stitches are in place. Ice and elevate for additional pain and swelling relief. Alternate between Ibuprofen and Tylenol for additional pain relief. Follow up with your primary care doctor or the [facility] [place] in approximately 3 days for wound recheck and again in 7-10 days for wound recheck and suture removal. Monitor area for signs of infection to include, but not limited to: increasing pain, spreading redness, drainage/pus, worsening swelling, or fevers. Return to emergency department for emergent changing or worsening symptoms.

## 2017-07-07 NOTE — ED Triage Notes (Signed)
Per [facility] pt coming from [employer], was coming around a corner when left leg hit a wall. Patient has about 2 in laceration to left shin. Pt is on eliquis, bleeding controlled. Patient did no fall.

## 2017-07-10 DIAGNOSIS — S81812D Laceration without foreign body, left lower leg, subsequent encounter: Secondary | ICD-10-CM | POA: Diagnosis not present

## 2017-07-10 DIAGNOSIS — R05 Cough: Secondary | ICD-10-CM | POA: Diagnosis not present

## 2017-07-11 DIAGNOSIS — M6281 Muscle weakness (generalized): Secondary | ICD-10-CM | POA: Diagnosis not present

## 2017-07-11 DIAGNOSIS — I5030 Unspecified diastolic (congestive) heart failure: Secondary | ICD-10-CM | POA: Diagnosis not present

## 2017-07-11 DIAGNOSIS — R262 Difficulty in walking, not elsewhere classified: Secondary | ICD-10-CM | POA: Diagnosis not present

## 2017-07-11 LAB — CUP PACEART REMOTE DEVICE CHECK
Battery Remaining Percentage: 0.5 %
Brady Statistic AS VP Percent: 7.3 %
Brady Statistic AS VS Percent: 1 %
Date Time Interrogation Session: 20190117161206
Implantable Lead Location: 753860
Implantable Lead Serial Number: 23000302
Lead Channel Pacing Threshold Pulse Width: 0.4 ms
Lead Channel Sensing Intrinsic Amplitude: 12 mV
Lead Channel Sensing Intrinsic Amplitude: 2.2 mV
Lead Channel Setting Pacing Amplitude: 0.875
Lead Channel Setting Pacing Amplitude: 2.25 V
Lead Channel Setting Sensing Sensitivity: 4 mV
MDC IDC LEAD IMPLANT DT: 19920213
MDC IDC LEAD IMPLANT DT: 19920213
MDC IDC LEAD LOCATION: 753859
MDC IDC MSMT BATTERY REMAINING LONGEVITY: 1 mo
MDC IDC MSMT BATTERY VOLTAGE: 2.59 V
MDC IDC MSMT LEADCHNL RA IMPEDANCE VALUE: 350 Ohm
MDC IDC MSMT LEADCHNL RA PACING THRESHOLD AMPLITUDE: 0.75 V
MDC IDC MSMT LEADCHNL RA PACING THRESHOLD PULSEWIDTH: 0.4 ms
MDC IDC MSMT LEADCHNL RV IMPEDANCE VALUE: 400 Ohm
MDC IDC MSMT LEADCHNL RV PACING THRESHOLD AMPLITUDE: 0.625 V
MDC IDC PG IMPLANT DT: 20101008
MDC IDC SET LEADCHNL RV PACING PULSEWIDTH: 0.4 ms
MDC IDC STAT BRADY AP VP PERCENT: 93 %
MDC IDC STAT BRADY AP VS PERCENT: 1 %
MDC IDC STAT BRADY RA PERCENT PACED: 88 %
MDC IDC STAT BRADY RV PERCENT PACED: 99 %
Pulse Gen Model: 2110
Pulse Gen Serial Number: 2314326

## 2017-07-13 DIAGNOSIS — M6281 Muscle weakness (generalized): Secondary | ICD-10-CM | POA: Diagnosis not present

## 2017-07-13 DIAGNOSIS — I5030 Unspecified diastolic (congestive) heart failure: Secondary | ICD-10-CM | POA: Diagnosis not present

## 2017-07-13 DIAGNOSIS — R262 Difficulty in walking, not elsewhere classified: Secondary | ICD-10-CM | POA: Diagnosis not present

## 2017-07-14 DIAGNOSIS — Z4802 Encounter for removal of sutures: Secondary | ICD-10-CM | POA: Diagnosis not present

## 2017-07-18 DIAGNOSIS — M6281 Muscle weakness (generalized): Secondary | ICD-10-CM | POA: Diagnosis not present

## 2017-07-18 DIAGNOSIS — I5030 Unspecified diastolic (congestive) heart failure: Secondary | ICD-10-CM | POA: Diagnosis not present

## 2017-07-18 DIAGNOSIS — R262 Difficulty in walking, not elsewhere classified: Secondary | ICD-10-CM | POA: Diagnosis not present

## 2017-07-19 DIAGNOSIS — I5030 Unspecified diastolic (congestive) heart failure: Secondary | ICD-10-CM | POA: Diagnosis not present

## 2017-07-19 DIAGNOSIS — M6281 Muscle weakness (generalized): Secondary | ICD-10-CM | POA: Diagnosis not present

## 2017-07-19 DIAGNOSIS — R262 Difficulty in walking, not elsewhere classified: Secondary | ICD-10-CM | POA: Diagnosis not present

## 2017-07-20 DIAGNOSIS — R531 Weakness: Secondary | ICD-10-CM | POA: Diagnosis not present

## 2017-07-21 DIAGNOSIS — M6281 Muscle weakness (generalized): Secondary | ICD-10-CM | POA: Diagnosis not present

## 2017-07-21 DIAGNOSIS — I5030 Unspecified diastolic (congestive) heart failure: Secondary | ICD-10-CM | POA: Diagnosis not present

## 2017-07-21 DIAGNOSIS — R262 Difficulty in walking, not elsewhere classified: Secondary | ICD-10-CM | POA: Diagnosis not present

## 2017-07-25 DIAGNOSIS — M6281 Muscle weakness (generalized): Secondary | ICD-10-CM | POA: Diagnosis not present

## 2017-07-25 DIAGNOSIS — I5030 Unspecified diastolic (congestive) heart failure: Secondary | ICD-10-CM | POA: Diagnosis not present

## 2017-07-25 DIAGNOSIS — R262 Difficulty in walking, not elsewhere classified: Secondary | ICD-10-CM | POA: Diagnosis not present

## 2017-07-26 ENCOUNTER — Ambulatory Visit (INDEPENDENT_AMBULATORY_CARE_PROVIDER_SITE_OTHER): Payer: Medicare Other | Admitting: Cardiovascular Disease

## 2017-07-26 ENCOUNTER — Encounter: Payer: Self-pay | Admitting: Cardiovascular Disease

## 2017-07-26 VITALS — BP 118/62 | HR 63 | Ht 65.0 in | Wt 147.0 lb

## 2017-07-26 DIAGNOSIS — I5032 Chronic diastolic (congestive) heart failure: Secondary | ICD-10-CM | POA: Diagnosis not present

## 2017-07-26 DIAGNOSIS — Z01812 Encounter for preprocedural laboratory examination: Secondary | ICD-10-CM

## 2017-07-26 DIAGNOSIS — I951 Orthostatic hypotension: Secondary | ICD-10-CM

## 2017-07-26 DIAGNOSIS — I442 Atrioventricular block, complete: Secondary | ICD-10-CM | POA: Diagnosis not present

## 2017-07-26 DIAGNOSIS — I480 Paroxysmal atrial fibrillation: Secondary | ICD-10-CM | POA: Diagnosis not present

## 2017-07-26 DIAGNOSIS — Z45010 Encounter for checking and testing of cardiac pacemaker pulse generator [battery]: Secondary | ICD-10-CM | POA: Diagnosis not present

## 2017-07-26 DIAGNOSIS — Z950 Presence of cardiac pacemaker: Secondary | ICD-10-CM

## 2017-07-26 DIAGNOSIS — Z7901 Long term (current) use of anticoagulants: Secondary | ICD-10-CM

## 2017-07-26 DIAGNOSIS — I495 Sick sinus syndrome: Secondary | ICD-10-CM

## 2017-07-26 NOTE — Progress Notes (Signed)
Patient ID: [name], female   DOB: 13-Jul-1930, 84 y.o.   MRN: [id number]    Cardiology Office Note    Date:  07/27/2017   ID:  [name], DOB [birth year], MRN [id number]  PCP:  Lajean Manes, MD  Cardiologist:  Sanda Klein, MD   No chief complaint: Pacemaker elective replacement interval  History of Present Illness:  [name] is a 84 y.o. female with complete heart block, sinus node dysfunction and pacemaker documentation of asymptomatic paroxysmal atrial fibrillation, diastolic heart failure and mild aortic insufficiency, on chronic prednisone for polymyalgia rheumatica.  This is once that was pacemaker dependent.  She has complete heart block and there is no evidence of any ventricular escape rhythm and pacing was taken down to 30 bpm her pacemaker reached elective replacement indicator on July 13, 2017.  She has not noticed any change in her stamina or breathing since then.    Generator is a Animator prior to reaching ERI her pacemaker showed 88% atrial pacing and 100% ventricular pacing.  She has occasional episodes of mode switch mostly lasting between 4 and 12 seconds and consistent with paroxysmal atrial tachycardia.  Her last meaningful episode of atrial fibrillation was on November 5 and lasted for about 5 hours.  Overall the burden of atrial fibrillation is 1.5%.  Her current generator is a Chief of Staff DR implanted in 2009.  The leads were implanted in 1997.  The lead parameters remain excellent.  The patient specifically denies any chest pain at rest exertion, dyspnea at rest or with exertion, orthopnea, paroxysmal nocturnal dyspnea, syncope, palpitations, focal neurological deficits, intermittent claudication, lower extremity edema, unexplained weight gain, cough, hemoptysis or wheezing.  She is taking Eliquis and has not had any bleeding problems or neurological events.  She does complain of very easy bruising and has a couple of superficial  hematomas on each leg.   She has had varied estimation of the severity of her aortic insufficiency by echocardiography but has a normal size left ventricle with normal left ventricular function and no other significant valvular abnormalities. Most recent echocardiogram showed only mild aortic insufficiency.   Her original pacemaker and the current leads were implanted in 1992. She has had 2 generator change out procedures, most recently in 2010. Her device is a Buyer, retail 2110 non RF dual-chamber pacemaker. The atrial lead is a St. Jude [id number] and the ventricular lead was a Biotronik [id number].    Past Medical History:  Diagnosis Date  . CHB (complete heart block) ([place]) 07/13/2013   Pacemaker dependent  . CHF (congestive heart failure) ([place])   . COPD (chronic obstructive pulmonary disease) ([place])   . DM (dermatomyositis)   . Orthostatic hypotension   . Pacemaker 07/13/2013   Her original pacemaker and the current leads were implanted in 1992. She has had 2 generator change out, most recently in 2010. Her device is a Buyer, retail 2110 non RF dual-chamber pacemaker with a battery longevity estimated at about 7 years. The atrial lead is a St. Jude 6962 and the ventricular lead was a Biotronik [id number].   [name] Paroxysmal atrial fibrillation ([place]) 09/04/2014   on Eliquis  . Polymyalgia rheumatica ([place])   . Scoliosis   . SSS (sick sinus syndrome) ([place]) 07/13/2013  . Syncope   . Systemic hypertension   . Thoracic kyphosis     Past Surgical History:  Procedure Laterality Date  . IR KYPHO THORACIC WITH BONE BIOPSY  01/04/2017  .  IR RADIOLOGIST EVAL & MGMT  01/10/2017  . NM MYOCAR PERF WALL MOTION  09/13/2011   Low risk  . PERMANENT PACEMAKER GENERATOR CHANGE  03/27/2009   St.Jude  . US ECHOCARDIOGRAPHY  03/28/2012   Mod LAE,mild MR,aortic sclerosis w/mod AI,mod. TR,mild PI,Stage I diastolic dysfunction  . VIDEO BRONCHOSCOPY Bilateral 10/07/2016   Procedure: VIDEO BRONCHOSCOPY WITH FLUORO;   Surgeon: Marshell Garfinkel, MD;  Location: [facility];  Service: Cardiopulmonary;  Laterality: Bilateral;    Current Medications: Outpatient Medications Prior to Visit  Medication Sig Dispense Refill  . acetaminophen (TYLENOL) 325 MG tablet Take 325 mg by mouth every 6 (six) hours as needed for mild pain, moderate pain or fever.     [name] albuterol (PROVENTIL) (2.5 MG/3ML) 0.083% nebulizer solution Take 3 mLs (2.5 mg total) by nebulization every 6 (six) hours as needed for wheezing or shortness of breath. 75 mL 12  . alendronate (FOSAMAX) 70 MG tablet Take 70 mg by mouth every Saturday. Take with a full glass of water on an empty stomach.    . ALPRAZolam (XANAX) 0.25 MG tablet Take 1 tablet (0.25 mg total) at bedtime as needed by mouth for sleep. 15 tablet 0  . apixaban (ELIQUIS) 2.5 MG TABS tablet Take 1 tablet (2.5 mg total) by mouth 2 (two) times daily. 180 tablet 1  . docusate sodium (COLACE) 100 MG capsule Take 1 capsule (100 mg total) daily as needed by mouth for mild constipation. 10 capsule 0  . escitalopram (LEXAPRO) 10 MG tablet Take 10 mg by mouth daily.    . furosemide (LASIX) 40 MG tablet Take 0.5 tablets (20 mg total) daily by mouth. 30 tablet 1  . HYDROcodone-acetaminophen (NORCO/VICODIN) 5-325 MG tablet Take 1-2 tablets every 4 (four) hours as needed by mouth for moderate pain. 15 tablet 0  . Ipratropium-Albuterol (COMBIVENT RESPIMAT) 20-100 MCG/ACT AERS respimat Inhale 1 puff into the lungs every 6 (six) hours as needed for wheezing or shortness of breath. 1 Inhaler 2  . metoprolol succinate (TOPROL-XL) 25 MG 24 hr tablet Take 25 mg by mouth daily.   3  . midodrine (PROAMATINE) 2.5 MG tablet Take 2.5 mg by mouth as needed (when systolic BP is lower than 100).     . mirtazapine (REMERON) 15 MG tablet Take 7.5 mg by mouth at bedtime.     . potassium chloride SA (K-DUR,KLOR-CON) 20 MEQ tablet Take 1 tablet (20 mEq total) daily by mouth. 30 tablet 0  . predniSONE (DELTASONE) 10 MG tablet  Take 6 tablets once daily for 3 days, then take 4 tablets once daily for 3 days, then take 2 tablets once daily for 3 days and then 1 tablet once daily as before. 60 tablet 0  . umeclidinium-vilanterol (ANORO ELLIPTA) 62.5-25 MCG/INH AEPB Inhale 1 puff daily into the lungs. 60 each 5   No facility-administered medications prior to visit.      Allergies:   Patient has no known allergies.   Social History   Socioeconomic History  . Marital status: Married    Spouse name: None  . Number of children: None  . Years of education: None  . Highest education level: None  Social Needs  . Financial resource strain: None  . Food insecurity - worry: None  . Food insecurity - inability: None  . Transportation needs - medical: None  . Transportation needs - non-medical: None  Occupational History  . Occupation: retired  Tobacco Use  . Smoking status: Former Smoker    Packs/day:  0.50    Years: 60.00    Pack years: 30.00    Types: Cigarettes  . Smokeless tobacco: Never Used  . Tobacco comment: quit 12/2016  Substance and Sexual Activity  . Alcohol use: No  . Drug use: No  . Sexual activity: Not Currently    Partners: Female  Other Topics Concern  . None  Social History Narrative  . None       ROS:   Please see the history of present illness.    ROS All other systems reviewed and are negative.   PHYSICAL EXAM:   VS:  BP 118/62   Pulse 63   Ht 5\' 5"  (1.651 m)   Wt 147 lb (66.7 kg)   BMI 24.46 kg/m    GEN: Well nourished, well developed, in no acute distress.  He is very slender HEENT: normal  Neck: no JVD, carotid bruits, or masses Cardiac: Paradoxically split second heart sound, RRR; no murmurs, rubs, or gallops,no edema; healthy subclavian pacemaker site  Respiratory:  Possibly a faint pleural rub in the left posterior base, otherwise clear to auscultation bilaterally, normal work of breathing; no wheezes or rhonchi heard GI: soft, nontender, nondistended, + BS MS: no  deformity or atrophy  Skin: warm and dry, no rash Neuro:  Alert and Oriented x 3, Strength and sensation are intact Psych: euthymic mood, full affect  Wt Readings from Last 3 Encounters:  07/26/17 147 lb (66.7 kg)  05/02/17 137 lb 6.4 oz (62.3 kg)  04/22/17 138 lb 3.7 oz (62.7 kg)      Studies/Labs Reviewed:   EKG:  EKG is ordered today.  It demonstrates atrial paced, ventricular paced rhythm, QRS 170 ms, QTC 470  Recent Labs: 10/11/2016: Magnesium 1.8; TSH 0.032 04/21/2017: ALT 16; B Natriuretic Peptide 917.2 04/23/2017: Hemoglobin 11.4; Platelets 337 04/25/2017: BUN 30; Creatinine, Ser 0.75; Potassium 3.9; Sodium 143   ASSESSMENT:    1. Paroxysmal atrial fibrillation (HCC)   2. SSS (sick sinus syndrome) ([place])   3. CHB (complete heart block) (HCC)   4. Pacemaker battery depletion   5. Chronic diastolic heart failure ([place])   6. Orthostatic hypotension   7. Long term current use of anticoagulant   8. Pre-procedure lab exam   9. Pacemaker      PLAN:  In order of problems listed above:  1. AFib: Her episodes are very infrequent.  They typically occur during respiratory infections.  Although the burden of arrhythmia is low, she should remain on anticoagulation due to high embolic risk. 2. SSS: Requires almost 90% atrial pacing, with appropriate heart rate histogram distribution. 3. CHB: She is pacemaker dependent, 100% V pacing 4. PPM: We will schedule for pacemaker generator change out.  Since this will be the third time surgery is performed in this area and she is pacemaker dependent, I have recommended a  Tyrx pouch 5. CHF:On today's exam she appears to be clinically euvolemic, NYHA functional class I-II. On a very low dose of diuretic. 6. Orthostatic hypotension: seems to be doing well on midodrine 7. Anticoagulation: She should continue anticoagulation since her embolic risk is high. CHADSVasc 5 (age 34, gender, heart failure, hypertension).  She has had some problems with  easy bruising and nuisance bleeding, but no severe bleeding complications or falls.   Medication Adjustments/Labs and Tests Ordered: Current medicines are reviewed at length with the patient today.  Concerns regarding medicines are outlined above.  Medication changes, Labs and Tests ordered today are listed in the Patient  Instructions below. Patient Instructions  You have been scheduled for a pacemaker generator changeout with Dr Sallyanne Kuster on Wednesday, February 20th, 2019. You will need to arrive at [facility] no later than 2:30 pm.   Instructions have been provided.  If you have any questions, please don't hesitate to reach out!    Signed, Sanda Klein, MD  07/27/2017 1:30 PM    [facility] [address], [place], [place]  [address] Phone: [phone]; Fax: [phone]

## 2017-07-26 NOTE — Patient Instructions (Signed)
You have been scheduled for a pacemaker generator changeout with Dr Sallyanne Kuster on Wednesday, February 20th, 2019. You will need to arrive at [facility] no later than 2:30 pm.   Instructions have been provided.  If you have any questions, please don't hesitate to reach out!

## 2017-07-26 NOTE — H&P (View-Only) (Signed)
Patient ID: [name], female   DOB: [birth year], 84 y.o.   MRN: [id number]    Cardiology Office Note    Date:  07/27/2017   ID:  [name], DOB 06/26/1930, MRN [id number]  PCP:  Lajean Manes, MD  Cardiologist:  Sanda Klein, MD   No chief complaint: Pacemaker elective replacement interval  History of Present Illness:  [name] is a 84 y.o. female with complete heart block, sinus node dysfunction and pacemaker documentation of asymptomatic paroxysmal atrial fibrillation, diastolic heart failure and mild aortic insufficiency, on chronic prednisone for polymyalgia rheumatica.  This is once that was pacemaker dependent.  She has complete heart block and there is no evidence of any ventricular escape rhythm and pacing was taken down to 30 bpm her pacemaker reached elective replacement indicator on July 13, 2017.  She has not noticed any change in her stamina or breathing since then.    Generator is a Animator prior to reaching ERI her pacemaker showed 88% atrial pacing and 100% ventricular pacing.  She has occasional episodes of mode switch mostly lasting between 4 and 12 seconds and consistent with paroxysmal atrial tachycardia.  Her last meaningful episode of atrial fibrillation was on November 5 and lasted for about 5 hours.  Overall the burden of atrial fibrillation is 1.5%.  Her current generator is a Chief of Staff DR implanted in 2009.  The leads were implanted in 1997.  The lead parameters remain excellent.  The patient specifically denies any chest pain at rest exertion, dyspnea at rest or with exertion, orthopnea, paroxysmal nocturnal dyspnea, syncope, palpitations, focal neurological deficits, intermittent claudication, lower extremity edema, unexplained weight gain, cough, hemoptysis or wheezing.  She is taking Eliquis and has not had any bleeding problems or neurological events.  She does complain of very easy bruising and has a couple of superficial  hematomas on each leg.   She has had varied estimation of the severity of her aortic insufficiency by echocardiography but has a normal size left ventricle with normal left ventricular function and no other significant valvular abnormalities. Most recent echocardiogram showed only mild aortic insufficiency.   Her original pacemaker and the current leads were implanted in 1992. She has had 2 generator change out procedures, most recently in 2010. Her device is a Buyer, retail 2110 non RF dual-chamber pacemaker. The atrial lead is a St. Jude 7106 and the ventricular lead was a Biotronik [id number].    Past Medical History:  Diagnosis Date  . CHB (complete heart block) ([place]) 07/13/2013   Pacemaker dependent  . CHF (congestive heart failure) ([place])   . COPD (chronic obstructive pulmonary disease) ([place])   . DM (dermatomyositis)   . Orthostatic hypotension   . Pacemaker 07/13/2013   Her original pacemaker and the current leads were implanted in 1992. She has had 2 generator change out, most recently in 2010. Her device is a Buyer, retail 2110 non RF dual-chamber pacemaker with a battery longevity estimated at about 7 years. The atrial lead is a St. Jude 2694 and the ventricular lead was a Biotronik [id number].   [name] Paroxysmal atrial fibrillation ([place]) 09/04/2014   on Eliquis  . Polymyalgia rheumatica ([place])   . Scoliosis   . SSS (sick sinus syndrome) ([place]) 07/13/2013  . Syncope   . Systemic hypertension   . Thoracic kyphosis     Past Surgical History:  Procedure Laterality Date  . IR KYPHO THORACIC WITH BONE BIOPSY  01/04/2017  .  IR RADIOLOGIST EVAL & MGMT  01/10/2017  . NM MYOCAR PERF WALL MOTION  09/13/2011   Low risk  . PERMANENT PACEMAKER GENERATOR CHANGE  03/27/2009   St.Jude  . US ECHOCARDIOGRAPHY  03/28/2012   Mod LAE,mild MR,aortic sclerosis w/mod AI,mod. TR,mild PI,Stage I diastolic dysfunction  . VIDEO BRONCHOSCOPY Bilateral 10/07/2016   Procedure: VIDEO BRONCHOSCOPY WITH FLUORO;   Surgeon: Marshell Garfinkel, MD;  Location: [facility];  Service: Cardiopulmonary;  Laterality: Bilateral;    Current Medications: Outpatient Medications Prior to Visit  Medication Sig Dispense Refill  . acetaminophen (TYLENOL) 325 MG tablet Take 325 mg by mouth every 6 (six) hours as needed for mild pain, moderate pain or fever.     [name] albuterol (PROVENTIL) (2.5 MG/3ML) 0.083% nebulizer solution Take 3 mLs (2.5 mg total) by nebulization every 6 (six) hours as needed for wheezing or shortness of breath. 75 mL 12  . alendronate (FOSAMAX) 70 MG tablet Take 70 mg by mouth every Saturday. Take with a full glass of water on an empty stomach.    . ALPRAZolam (XANAX) 0.25 MG tablet Take 1 tablet (0.25 mg total) at bedtime as needed by mouth for sleep. 15 tablet 0  . apixaban (ELIQUIS) 2.5 MG TABS tablet Take 1 tablet (2.5 mg total) by mouth 2 (two) times daily. 180 tablet 1  . docusate sodium (COLACE) 100 MG capsule Take 1 capsule (100 mg total) daily as needed by mouth for mild constipation. 10 capsule 0  . escitalopram (LEXAPRO) 10 MG tablet Take 10 mg by mouth daily.    . furosemide (LASIX) 40 MG tablet Take 0.5 tablets (20 mg total) daily by mouth. 30 tablet 1  . HYDROcodone-acetaminophen (NORCO/VICODIN) 5-325 MG tablet Take 1-2 tablets every 4 (four) hours as needed by mouth for moderate pain. 15 tablet 0  . Ipratropium-Albuterol (COMBIVENT RESPIMAT) 20-100 MCG/ACT AERS respimat Inhale 1 puff into the lungs every 6 (six) hours as needed for wheezing or shortness of breath. 1 Inhaler 2  . metoprolol succinate (TOPROL-XL) 25 MG 24 hr tablet Take 25 mg by mouth daily.   3  . midodrine (PROAMATINE) 2.5 MG tablet Take 2.5 mg by mouth as needed (when systolic BP is lower than 100).     . mirtazapine (REMERON) 15 MG tablet Take 7.5 mg by mouth at bedtime.     . potassium chloride SA (K-DUR,KLOR-CON) 20 MEQ tablet Take 1 tablet (20 mEq total) daily by mouth. 30 tablet 0  . predniSONE (DELTASONE) 10 MG tablet  Take 6 tablets once daily for 3 days, then take 4 tablets once daily for 3 days, then take 2 tablets once daily for 3 days and then 1 tablet once daily as before. 60 tablet 0  . umeclidinium-vilanterol (ANORO ELLIPTA) 62.5-25 MCG/INH AEPB Inhale 1 puff daily into the lungs. 60 each 5   No facility-administered medications prior to visit.      Allergies:   Patient has no known allergies.   Social History   Socioeconomic History  . Marital status: Married    Spouse name: None  . Number of children: None  . Years of education: None  . Highest education level: None  Social Needs  . Financial resource strain: None  . Food insecurity - worry: None  . Food insecurity - inability: None  . Transportation needs - medical: None  . Transportation needs - non-medical: None  Occupational History  . Occupation: retired  Tobacco Use  . Smoking status: Former Smoker    Packs/day:  0.50    Years: 60.00    Pack years: 30.00    Types: Cigarettes  . Smokeless tobacco: Never Used  . Tobacco comment: quit 12/2016  Substance and Sexual Activity  . Alcohol use: No  . Drug use: No  . Sexual activity: Not Currently    Partners: Female  Other Topics Concern  . None  Social History Narrative  . None       ROS:   Please see the history of present illness.    ROS All other systems reviewed and are negative.   PHYSICAL EXAM:   VS:  BP 118/62   Pulse 63   Ht 5\' 5"  (1.651 m)   Wt 147 lb (66.7 kg)   BMI 24.46 kg/m    GEN: Well nourished, well developed, in no acute distress.  He is very slender HEENT: normal  Neck: no JVD, carotid bruits, or masses Cardiac: Paradoxically split second heart sound, RRR; no murmurs, rubs, or gallops,no edema; healthy subclavian pacemaker site  Respiratory:  Possibly a faint pleural rub in the left posterior base, otherwise clear to auscultation bilaterally, normal work of breathing; no wheezes or rhonchi heard GI: soft, nontender, nondistended, + BS MS: no  deformity or atrophy  Skin: warm and dry, no rash Neuro:  Alert and Oriented x 3, Strength and sensation are intact Psych: euthymic mood, full affect  Wt Readings from Last 3 Encounters:  07/26/17 147 lb (66.7 kg)  05/02/17 137 lb 6.4 oz (62.3 kg)  04/22/17 138 lb 3.7 oz (62.7 kg)      Studies/Labs Reviewed:   EKG:  EKG is ordered today.  It demonstrates atrial paced, ventricular paced rhythm, QRS 170 ms, QTC 470  Recent Labs: 10/11/2016: Magnesium 1.8; TSH 0.032 04/21/2017: ALT 16; B Natriuretic Peptide 917.2 04/23/2017: Hemoglobin 11.4; Platelets 337 04/25/2017: BUN 30; Creatinine, Ser 0.75; Potassium 3.9; Sodium 143   ASSESSMENT:    1. Paroxysmal atrial fibrillation (HCC)   2. SSS (sick sinus syndrome) ([place])   3. CHB (complete heart block) (HCC)   4. Pacemaker battery depletion   5. Chronic diastolic heart failure ([place])   6. Orthostatic hypotension   7. Long term current use of anticoagulant   8. Pre-procedure lab exam   9. Pacemaker      PLAN:  In order of problems listed above:  1. AFib: Her episodes are very infrequent.  They typically occur during respiratory infections.  Although the burden of arrhythmia is low, she should remain on anticoagulation due to high embolic risk. 2. SSS: Requires almost 90% atrial pacing, with appropriate heart rate histogram distribution. 3. CHB: She is pacemaker dependent, 100% V pacing 4. PPM: We will schedule for pacemaker generator change out.  Since this will be the third time surgery is performed in this area and she is pacemaker dependent, I have recommended a  Tyrx pouch 5. CHF:On today's exam she appears to be clinically euvolemic, NYHA functional class I-II. On a very low dose of diuretic. 6. Orthostatic hypotension: seems to be doing well on midodrine 7. Anticoagulation: She should continue anticoagulation since her embolic risk is high. CHADSVasc 5 (age 66, gender, heart failure, hypertension).  She has had some problems with  easy bruising and nuisance bleeding, but no severe bleeding complications or falls.   Medication Adjustments/Labs and Tests Ordered: Current medicines are reviewed at length with the patient today.  Concerns regarding medicines are outlined above.  Medication changes, Labs and Tests ordered today are listed in the Patient  Instructions below. Patient Instructions  You have been scheduled for a pacemaker generator changeout with Dr Sallyanne Kuster on Wednesday, February 20th, 2019. You will need to arrive at [facility] no later than 2:30 pm.   Instructions have been provided.  If you have any questions, please don't hesitate to reach out!    Signed, Sanda Klein, MD  07/27/2017 1:30 PM    [facility] [address], [place], [place]  [address] Phone: [phone]; Fax: [phone]

## 2017-07-27 DIAGNOSIS — M6281 Muscle weakness (generalized): Secondary | ICD-10-CM | POA: Diagnosis not present

## 2017-07-27 DIAGNOSIS — I5030 Unspecified diastolic (congestive) heart failure: Secondary | ICD-10-CM | POA: Diagnosis not present

## 2017-07-27 DIAGNOSIS — R262 Difficulty in walking, not elsewhere classified: Secondary | ICD-10-CM | POA: Diagnosis not present

## 2017-07-28 DIAGNOSIS — R262 Difficulty in walking, not elsewhere classified: Secondary | ICD-10-CM | POA: Diagnosis not present

## 2017-07-28 DIAGNOSIS — M6281 Muscle weakness (generalized): Secondary | ICD-10-CM | POA: Diagnosis not present

## 2017-07-28 DIAGNOSIS — I5030 Unspecified diastolic (congestive) heart failure: Secondary | ICD-10-CM | POA: Diagnosis not present

## 2017-07-31 DIAGNOSIS — I5030 Unspecified diastolic (congestive) heart failure: Secondary | ICD-10-CM | POA: Diagnosis not present

## 2017-07-31 DIAGNOSIS — M6281 Muscle weakness (generalized): Secondary | ICD-10-CM | POA: Diagnosis not present

## 2017-07-31 DIAGNOSIS — R262 Difficulty in walking, not elsewhere classified: Secondary | ICD-10-CM | POA: Diagnosis not present

## 2017-07-31 LAB — CUP PACEART INCLINIC DEVICE CHECK
Implantable Lead Location: 753860
Implantable Lead Serial Number: 23000302
Implantable Pulse Generator Implant Date: 20101008
MDC IDC LEAD IMPLANT DT: 19920213
MDC IDC LEAD IMPLANT DT: 19920213
MDC IDC LEAD LOCATION: 753859
MDC IDC SESS DTM: 20190211140754
Pulse Gen Serial Number: 2314326

## 2017-08-01 ENCOUNTER — Ambulatory Visit: Payer: Medicare Other | Admitting: Pulmonary Disease

## 2017-08-01 ENCOUNTER — Other Ambulatory Visit: Payer: Self-pay | Admitting: Cardiovascular Disease

## 2017-08-01 DIAGNOSIS — Z45010 Encounter for checking and testing of cardiac pacemaker pulse generator [battery]: Secondary | ICD-10-CM

## 2017-08-02 ENCOUNTER — Encounter: Payer: Medicare Other | Admitting: *Deleted

## 2017-08-02 ENCOUNTER — Telehealth: Payer: Self-pay | Admitting: Cardiology

## 2017-08-02 DIAGNOSIS — D649 Anemia, unspecified: Secondary | ICD-10-CM | POA: Diagnosis not present

## 2017-08-02 DIAGNOSIS — R262 Difficulty in walking, not elsewhere classified: Secondary | ICD-10-CM | POA: Diagnosis not present

## 2017-08-02 DIAGNOSIS — M6281 Muscle weakness (generalized): Secondary | ICD-10-CM | POA: Diagnosis not present

## 2017-08-02 DIAGNOSIS — I5030 Unspecified diastolic (congestive) heart failure: Secondary | ICD-10-CM | POA: Diagnosis not present

## 2017-08-02 DIAGNOSIS — I10 Essential (primary) hypertension: Secondary | ICD-10-CM | POA: Diagnosis not present

## 2017-08-02 NOTE — Telephone Encounter (Signed)
Spoke with pt and reminded pt of remote transmission that is due today. Pt verbalized understanding.   

## 2017-08-03 ENCOUNTER — Encounter: Payer: Self-pay | Admitting: Cardiology

## 2017-08-03 DIAGNOSIS — R531 Weakness: Secondary | ICD-10-CM | POA: Diagnosis not present

## 2017-08-04 DIAGNOSIS — R05 Cough: Secondary | ICD-10-CM | POA: Diagnosis not present

## 2017-08-08 DIAGNOSIS — R262 Difficulty in walking, not elsewhere classified: Secondary | ICD-10-CM | POA: Diagnosis not present

## 2017-08-08 DIAGNOSIS — M6281 Muscle weakness (generalized): Secondary | ICD-10-CM | POA: Diagnosis not present

## 2017-08-08 DIAGNOSIS — I5030 Unspecified diastolic (congestive) heart failure: Secondary | ICD-10-CM | POA: Diagnosis not present

## 2017-08-09 ENCOUNTER — Ambulatory Visit (HOSPITAL_COMMUNITY)
Admission: RE | Admit: 2017-08-09 | Discharge: 2017-08-09 | Disposition: A | Discharge status: Home / Self Care | Payer: Medicare Other | Source: Ambulatory Visit | Attending: Cardiovascular Disease | Admitting: Cardiovascular Disease

## 2017-08-09 ENCOUNTER — Encounter (HOSPITAL_COMMUNITY)
Admission: RE | Disposition: A | Discharge status: Home / Self Care | Payer: Self-pay | Source: Ambulatory Visit | Attending: Cardiovascular Disease

## 2017-08-09 DIAGNOSIS — M353 Polymyalgia rheumatica: Secondary | ICD-10-CM | POA: Insufficient documentation

## 2017-08-09 DIAGNOSIS — Z79899 Other long term (current) drug therapy: Secondary | ICD-10-CM | POA: Insufficient documentation

## 2017-08-09 DIAGNOSIS — I442 Atrioventricular block, complete: Secondary | ICD-10-CM | POA: Diagnosis not present

## 2017-08-09 DIAGNOSIS — I110 Hypertensive heart disease with heart failure: Secondary | ICD-10-CM | POA: Diagnosis not present

## 2017-08-09 DIAGNOSIS — Z01818 Encounter for other preprocedural examination: Secondary | ICD-10-CM | POA: Insufficient documentation

## 2017-08-09 DIAGNOSIS — I351 Nonrheumatic aortic (valve) insufficiency: Secondary | ICD-10-CM | POA: Insufficient documentation

## 2017-08-09 DIAGNOSIS — Z7952 Long term (current) use of systemic steroids: Secondary | ICD-10-CM | POA: Diagnosis not present

## 2017-08-09 DIAGNOSIS — I509 Heart failure, unspecified: Secondary | ICD-10-CM | POA: Insufficient documentation

## 2017-08-09 DIAGNOSIS — Z45010 Encounter for checking and testing of cardiac pacemaker pulse generator [battery]: Secondary | ICD-10-CM

## 2017-08-09 DIAGNOSIS — I480 Paroxysmal atrial fibrillation: Secondary | ICD-10-CM | POA: Insufficient documentation

## 2017-08-09 DIAGNOSIS — Z87891 Personal history of nicotine dependence: Secondary | ICD-10-CM | POA: Insufficient documentation

## 2017-08-09 DIAGNOSIS — Z950 Presence of cardiac pacemaker: Secondary | ICD-10-CM | POA: Insufficient documentation

## 2017-08-09 DIAGNOSIS — I951 Orthostatic hypotension: Secondary | ICD-10-CM | POA: Insufficient documentation

## 2017-08-09 DIAGNOSIS — I495 Sick sinus syndrome: Secondary | ICD-10-CM | POA: Diagnosis not present

## 2017-08-09 DIAGNOSIS — J449 Chronic obstructive pulmonary disease, unspecified: Secondary | ICD-10-CM | POA: Diagnosis not present

## 2017-08-09 DIAGNOSIS — Z7901 Long term (current) use of anticoagulants: Secondary | ICD-10-CM | POA: Diagnosis not present

## 2017-08-09 HISTORY — PX: PPM GENERATOR CHANGEOUT: EP1233

## 2017-08-09 LAB — SURGICAL PCR SCREEN
MRSA, PCR: NEGATIVE
Staphylococcus aureus: NEGATIVE

## 2017-08-09 SURGERY — PPM GENERATOR CHANGEOUT

## 2017-08-09 MED ORDER — FENTANYL CITRATE (PF) 100 MCG/2ML IJ SOLN
INTRAMUSCULAR | Status: AC
  Filled 2017-08-09: qty 2

## 2017-08-09 MED ORDER — SODIUM CHLORIDE 0.9 % IR SOLN
80.0000 mg | Status: AC
  Administered 2017-08-09: 80 mg

## 2017-08-09 MED ORDER — CHLORHEXIDINE GLUCONATE 4 % EX LIQD: 60.0000 mL | Freq: Once | CUTANEOUS | Status: DC

## 2017-08-09 MED ORDER — MIDAZOLAM HCL 5 MG/5ML IJ SOLN
INTRAMUSCULAR | Status: AC
  Filled 2017-08-09: qty 5

## 2017-08-09 MED ORDER — CEFAZOLIN SODIUM-DEXTROSE 2-4 GM/100ML-% IV SOLN
INTRAVENOUS | Status: AC
  Filled 2017-08-09: qty 100

## 2017-08-09 MED ORDER — CEFAZOLIN SODIUM-DEXTROSE 2-4 GM/100ML-% IV SOLN
2.0000 g | INTRAVENOUS | Status: AC
  Administered 2017-08-09: 2 g via INTRAVENOUS

## 2017-08-09 MED ORDER — SODIUM CHLORIDE 0.9% FLUSH: 3.0000 mL | Freq: Two times a day (BID) | INTRAVENOUS | Status: DC

## 2017-08-09 MED ORDER — GENTAMICIN SULFATE 40 MG/ML IJ SOLN
INTRAMUSCULAR | Status: AC
  Filled 2017-08-09: qty 2

## 2017-08-09 MED ORDER — SODIUM CHLORIDE 0.9 % IV SOLN
INTRAVENOUS | Status: DC
  Administered 2017-08-09: 15:00:00 via INTRAVENOUS

## 2017-08-09 MED ORDER — MUPIROCIN 2 % EX OINT
1.0000 "application " | TOPICAL_OINTMENT | Freq: Once | CUTANEOUS | Status: AC
  Administered 2017-08-09: 1 via TOPICAL

## 2017-08-09 MED ORDER — ONDANSETRON HCL 4 MG/2ML IJ SOLN: 4.0000 mg | Freq: Four times a day (QID) | INTRAMUSCULAR | Status: DC | PRN

## 2017-08-09 MED ORDER — APIXABAN 2.5 MG PO TABS: 2.5000 mg | ORAL_TABLET | Freq: Two times a day (BID) | ORAL | 1 refills | Status: DC

## 2017-08-09 MED ORDER — ACETAMINOPHEN 325 MG PO TABS
325.0000 mg | ORAL_TABLET | ORAL | Status: DC | PRN
Start: 2017-08-09 — End: 2017-08-09
  Filled 2017-08-09: qty 2

## 2017-08-09 MED ORDER — LIDOCAINE HCL (PF) 1 % IJ SOLN
INTRAMUSCULAR | Status: AC
  Filled 2017-08-09: qty 60

## 2017-08-09 MED ORDER — LIDOCAINE HCL (PF) 1 % IJ SOLN
INTRAMUSCULAR | Status: DC | PRN
  Administered 2017-08-09: 60 mL

## 2017-08-09 MED ORDER — SODIUM CHLORIDE 0.9% FLUSH: 3.0000 mL | INTRAVENOUS | Status: DC | PRN

## 2017-08-09 MED ORDER — MUPIROCIN 2 % EX OINT
TOPICAL_OINTMENT | CUTANEOUS | Status: AC
  Administered 2017-08-09: 1 via TOPICAL
  Filled 2017-08-09: qty 22

## 2017-08-09 MED ORDER — MIDAZOLAM HCL 5 MG/5ML IJ SOLN
INTRAMUSCULAR | Status: DC | PRN
  Administered 2017-08-09 (×3): 1 mg via INTRAVENOUS

## 2017-08-09 MED ORDER — SODIUM CHLORIDE 0.9 % IV SOLN: 250.0000 mL | INTRAVENOUS | Status: DC | PRN

## 2017-08-09 SURGICAL SUPPLY — 8 items
CABLE SURGICAL S-101-97-12 (CABLE) ×2 IMPLANT
DEVICE DISSECT PLASMABLAD 3.0S (MISCELLANEOUS) IMPLANT
PACEMAKER ASSURITY DR-RF (Pacemaker) ×2 IMPLANT
PAD DEFIB LIFELINK (PAD) ×2 IMPLANT
PLASMABLADE 3.0S (MISCELLANEOUS) ×3
POUCH AIGIS-R ANTIBACT ICD (Mesh General) ×3 IMPLANT
POUCH AIGIS-R ANTIBACT ICD LRG (Mesh General) IMPLANT
TRAY PACEMAKER INSERTION (PACKS) ×2 IMPLANT

## 2017-08-09 NOTE — Discharge Instructions (Signed)

## 2017-08-09 NOTE — Op Note (Signed)
Procedure report  Procedure performed:  1. Dual chamber pacemaker generator changeout  2. Light sedation  3. TyRx pouch Reason for procedure:  1. Device generator at elective replacement interval  2. Complete heart block Procedure performed by:  Sanda Klein, MD  Complications:  None  Estimated blood loss:  <5 mL  Medications administered during procedure:  Ancef 2 g intravenously, lidocaine 1% 30 mL locally, Versed 3 mg intravenously  During this procedure the patient is administered a total of Versed 3 mg IV to achieve and maintain moderate conscious sedation.  The patient's heart rate, blood pressure, and oxygen saturation are monitored continuously during the procedure. The period of conscious sedation is 23 minutes, of which I was present face-to-face 100% of this time.  Device details:   Web designer. Jude Assurity MRI model number [id number], serial number [id number] Right atrial lead (chronic) St. Jude, model number 6283M, serial number [id number] (implanted 08/02/1990) Right ventricular lead (chronic)  Biotronik, model number OQ94TM, serial number [id number] (implanted 08/02/1990)  Explanted generator St. Jude Accent 2110  Procedure details:  After the risks and benefits of the procedure were discussed the patient provided informed consent. She was brought to the cardiac catheter lab in the fasting state. The patient was prepped and draped in usual sterile fashion. Local anesthesia with 1% lidocaine was administered to to the left infraclavicular area. A 5-6cm horizontal incision was made parallel with and 2-3 cm caudal to the right clavicle, in the area of an old scar. Using minimal Plasma Blade electrocautery and mostly blunt dissection the prepectoral pocket was opened carefully to avoid injury to the loops of chronic leads. Extensive dissection was not necessary. The device was explanted. The pocket was carefully inspected for hemostasis and flushed with copious amounts of  antibiotic solution.  The leads were disconnected from the old generator and testing of the lead parameters via telemetry showed excellent values. The new generator was connected to the chronic leads, with appropriate pacing noted. The device was placed in a Tyrx pouch that had been soaked in sterile saline for 30 seconds.  The entire system was then carefully inserted in the pocket with care been taking that the leads and device assumed a comfortable position without pressure on the incision. Great care was taken that the leads be located deep to the generator. The pocket was then closed in layers using 2 layers of 2-0 Vicryl and cutaneous staples after which a sterile dressing was applied.   At the end of the procedure the following lead parameters were encountered:   Right atrial lead sensed P waves 1.5 mV, impedance 350 ohms, threshold 0.7 at 0.5 ms pulse width.  Right ventricular lead sensed R waves  None detected, impedance 430 ohms, threshold 0.7 at 0.5 ms pulse width.  Sanda Klein, MD, [facility] [phone] office [phone] pager

## 2017-08-09 NOTE — Interval H&P Note (Signed)
History and Physical Interval Note:  08/09/2017 4:50 PM  [name]  has presented today for surgery, with the diagnosis of eri  The various methods of treatment have been discussed with the patient and family. After consideration of risks, benefits and other options for treatment, the patient has consented to  Procedure(s): PPM GENERATOR CHANGEOUT (N/A) as a surgical intervention .  The patient's history has been reviewed, patient examined, no change in status, stable for surgery.  I have reviewed the patient's chart and labs.  Questions were answered to the patient's satisfaction.     [name]

## 2017-08-10 ENCOUNTER — Encounter (HOSPITAL_COMMUNITY): Payer: Self-pay | Admitting: Cardiovascular Disease

## 2017-08-10 DIAGNOSIS — R262 Difficulty in walking, not elsewhere classified: Secondary | ICD-10-CM | POA: Diagnosis not present

## 2017-08-10 DIAGNOSIS — M6281 Muscle weakness (generalized): Secondary | ICD-10-CM | POA: Diagnosis not present

## 2017-08-10 DIAGNOSIS — I5030 Unspecified diastolic (congestive) heart failure: Secondary | ICD-10-CM | POA: Diagnosis not present

## 2017-08-10 MED FILL — Fentanyl Citrate Preservative Free (PF) Inj 100 MCG/2ML: INTRAMUSCULAR | Qty: 2 | Status: AC

## 2017-08-14 DIAGNOSIS — R262 Difficulty in walking, not elsewhere classified: Secondary | ICD-10-CM | POA: Diagnosis not present

## 2017-08-14 DIAGNOSIS — I5030 Unspecified diastolic (congestive) heart failure: Secondary | ICD-10-CM | POA: Diagnosis not present

## 2017-08-14 DIAGNOSIS — M6281 Muscle weakness (generalized): Secondary | ICD-10-CM | POA: Diagnosis not present

## 2017-08-15 DIAGNOSIS — I5030 Unspecified diastolic (congestive) heart failure: Secondary | ICD-10-CM | POA: Diagnosis not present

## 2017-08-15 DIAGNOSIS — R262 Difficulty in walking, not elsewhere classified: Secondary | ICD-10-CM | POA: Diagnosis not present

## 2017-08-15 DIAGNOSIS — M6281 Muscle weakness (generalized): Secondary | ICD-10-CM | POA: Diagnosis not present

## 2017-08-16 ENCOUNTER — Ambulatory Visit (INDEPENDENT_AMBULATORY_CARE_PROVIDER_SITE_OTHER): Payer: Medicare Other | Admitting: Pulmonary Disease

## 2017-08-16 ENCOUNTER — Encounter: Payer: Self-pay | Admitting: Pulmonary Disease

## 2017-08-16 VITALS — BP 116/60 | HR 59 | Ht 65.0 in | Wt 147.6 lb

## 2017-08-16 DIAGNOSIS — J438 Other emphysema: Secondary | ICD-10-CM

## 2017-08-16 DIAGNOSIS — T17908S Unspecified foreign body in respiratory tract, part unspecified causing other injury, sequela: Secondary | ICD-10-CM

## 2017-08-16 MED ORDER — UMECLIDINIUM-VILANTEROL 62.5-25 MCG/INH IN AEPB: 1.0000 | INHALATION_SPRAY | Freq: Every day | RESPIRATORY_TRACT | 5 refills | Status: DC

## 2017-08-16 NOTE — Patient Instructions (Signed)
I am glad that your breathing is stable Continue the Anoro inhaler and duo nebs every 6 hours as needed We will stop the albuterol inhaler and and Combivent inhaler as you are already getting these medications with the other treatment Follow-up in 6 months.

## 2017-08-16 NOTE — Progress Notes (Signed)
[name]    [id number]    May 09, 1931  Primary Care Physician:Stoneking, Christiane Ha, MD  Referring Physician: Lajean Manes, MD [address] [place], [place] [address]  Chief complaint:   Follow up for  COPD Recurrent pneumonia, MAI infection Chronic aspiration   HPI: 84 year old with history of complete heart block status post pacemaker, diastolic heart failure, paroxysmal A. fib, and prolymyalgia rheumatica, COPD. She was admitted to [facility] in April, 2018 with fall while on a trip outside [place]. She did not seek medical attention for about a week and came with worsening dyspnea, cough, congestion, mucus production, wheezing. CT scan in the ED showed fractured ribs on the right with extensive consolidation, narrowing of the bronchus on the right with question on endobronchial lesion. PCCM called for further evaluation. She underwent a bronchoscopy on 10/07/16 which did not show any endobronchial lesion. There is purulent secretions in the right lung which was sent for culture. The microbiology shows Pseudomonas and viridans strep which was treated adequately with antibiotics. The AFB culture grew MAI.  She's had multiple admissions in 2018 for acute on chronic respiratory failure, CHF, COPD exacerbation. Readmitted on 04/21/17 with hospital-acquired pneumonia, COPD exacerbation and acute diastolic heart failure.  Swallow eval during this admission noted to have chronic aspiration and placed on aspiration precautions  Interim History: At last visit she was taken of inhaled corticosteroids and started on anoro.  She is stable on this Reports nonproductive cough with occasional white mucus, occasional dyspnea on exertion with wheezing.  She is under palliative care at her nursing home.  Outpatient Encounter Medications as of 08/16/2017  Medication Sig  . acetaminophen (TYLENOL) 325 MG tablet Take 325 mg by mouth every 6 (six) hours as needed for mild pain, moderate  pain or fever.   [name] albuterol (PROVENTIL) (2.5 MG/3ML) 0.083% nebulizer solution Take 3 mLs (2.5 mg total) by nebulization every 6 (six) hours as needed for wheezing or shortness of breath.  [name] alendronate (FOSAMAX) 70 MG tablet Take 70 mg by mouth every Saturday. Take with a full glass of water on an empty stomach.  . ALPRAZolam (XANAX) 0.25 MG tablet Take 1 tablet (0.25 mg total) at bedtime as needed by mouth for sleep. (Patient taking differently: Take 0.25 mg by mouth at bedtime. )  . apixaban (ELIQUIS) 2.5 MG TABS tablet Take 1 tablet (2.5 mg total) by mouth 2 (two) times daily. Start on 08/10/2017 in AM  . docusate sodium (COLACE) 100 MG capsule Take 1 capsule (100 mg total) daily as needed by mouth for mild constipation.  [name] escitalopram (LEXAPRO) 10 MG tablet Take 10 mg by mouth daily.  . furosemide (LASIX) 40 MG tablet Take 0.5 tablets (20 mg total) daily by mouth. (Patient taking differently: Take 40 mg by mouth daily as needed (for weight gain greater than 2lbs). )  . HYDROcodone-acetaminophen (NORCO/VICODIN) 5-325 MG tablet Take 1-2 tablets every 4 (four) hours as needed by mouth for moderate pain. (Patient taking differently: Take 1 tablet by mouth See admin instructions. Take 1 tablet by mouth daily at 1000. Take 1 tablet by mouth every 8 hours as needed for pain)  . Ipratropium-Albuterol (COMBIVENT RESPIMAT) 20-100 MCG/ACT AERS respimat Inhale 1 puff into the lungs every 6 (six) hours as needed for wheezing or shortness of breath.  [name] ipratropium-albuterol (DUONEB) 0.5-2.5 (3) MG/3ML SOLN Take 3 mLs by nebulization every 6 (six) hours as needed (for wheezing).  . metoprolol succinate (  TOPROL-XL) 25 MG 24 hr tablet Take 25 mg by mouth daily.   . midodrine (PROAMATINE) 2.5 MG tablet Take 2.5 mg by mouth as needed (when systolic BP is lower than 100).   . mirtazapine (REMERON) 15 MG tablet Take 7.5 mg by mouth at bedtime.   . ondansetron (ZOFRAN) 4 MG tablet Take 4 mg by mouth every 6 (six)  hours as needed for nausea or vomiting.  . potassium chloride SA (K-DUR,KLOR-CON) 20 MEQ tablet Take 1 tablet (20 mEq total) daily by mouth. (Patient taking differently: Take 20 mEq by mouth daily as needed (with Lasix). )  . predniSONE (DELTASONE) 10 MG tablet Take 6 tablets once daily for 3 days, then take 4 tablets once daily for 3 days, then take 2 tablets once daily for 3 days and then 1 tablet once daily as before. (Patient taking differently: Take 10 mg by mouth daily. )  . umeclidinium-vilanterol (ANORO ELLIPTA) 62.5-25 MCG/INH AEPB Inhale 1 puff daily into the lungs.  . [DISCONTINUED] acetaminophen (TYLENOL) 650 MG suppository Place 650 mg rectally every 6 (six) hours as needed for moderate pain.   No facility-administered encounter medications on file as of 08/16/2017.     Allergies as of 08/16/2017  . (No Known Allergies)    Past Medical History:  Diagnosis Date  . CHB (complete heart block) ([place]) 07/13/2013   Pacemaker dependent  . CHF (congestive heart failure) ([place])   . COPD (chronic obstructive pulmonary disease) ([place])   . DM (dermatomyositis)   . Orthostatic hypotension   . Pacemaker 07/13/2013   Her original pacemaker and the current leads were implanted in 1992. She has had 2 generator change out, most recently in 2010. Her device is a Buyer, retail 2110 non RF dual-chamber pacemaker with a battery longevity estimated at about 7 years. The atrial lead is a St. Jude [id number] and the ventricular lead was a Biotronik [id number].   [name] Paroxysmal atrial fibrillation ([place]) 09/04/2014   on Eliquis  . Polymyalgia rheumatica ([place])   . Scoliosis   . SSS (sick sinus syndrome) ([place]) 07/13/2013  . Syncope   . Systemic hypertension   . Thoracic kyphosis     Past Surgical History:  Procedure Laterality Date  . IR KYPHO THORACIC WITH BONE BIOPSY  01/04/2017  . IR RADIOLOGIST EVAL & MGMT  01/10/2017  . NM MYOCAR PERF WALL MOTION  09/13/2011   Low risk  . PERMANENT PACEMAKER GENERATOR CHANGE   03/27/2009   St.Jude  . PPM GENERATOR CHANGEOUT N/A 08/09/2017   Procedure: PPM GENERATOR CHANGEOUT;  Surgeon: Sanda Klein, MD;  Location: [facility];  Service: Cardiovascular;  Laterality: N/A;  . US ECHOCARDIOGRAPHY  03/28/2012   Mod LAE,mild MR,aortic sclerosis w/mod AI,mod. TR,mild PI,Stage I diastolic dysfunction  . VIDEO BRONCHOSCOPY Bilateral 10/07/2016   Procedure: VIDEO BRONCHOSCOPY WITH FLUORO;  Surgeon: Marshell Garfinkel, MD;  Location: [facility];  Service: Cardiopulmonary;  Laterality: Bilateral;    Family History  Problem Relation Age of Onset  . Stroke Mother     Social History   Socioeconomic History  . Marital status: Married    Spouse name: Not on file  . Number of children: Not on file  . Years of education: Not on file  . Highest education level: Not on file  Social Needs  . Financial resource strain: Not on file  . Food insecurity - worry: Not on file  . Food insecurity - inability: Not on file  . Transportation needs -  medical: Not on file  . Transportation needs - non-medical: Not on file  Occupational History  . Occupation: retired  Tobacco Use  . Smoking status: Former Smoker    Packs/day: 0.50    Years: 60.00    Pack years: 30.00    Types: Cigarettes  . Smokeless tobacco: Never Used  . Tobacco comment: quit 12/2016  Substance and Sexual Activity  . Alcohol use: No  . Drug use: No  . Sexual activity: Not Currently    Partners: Female  Other Topics Concern  . Not on file  Social History Narrative  . Not on file   Review of systems: Review of Systems  Constitutional: Negative for fever and chills.  HENT: Negative.   Eyes: Negative for blurred vision.  Respiratory: as per HPI  Cardiovascular: Negative for chest pain and palpitations.  Gastrointestinal: Negative for vomiting, diarrhea, blood per rectum. Genitourinary: Negative for dysuria, urgency, frequency and hematuria.  Musculoskeletal: Negative for myalgias, back pain and  joint pain.  Skin: Negative for itching and rash.  Neurological: Negative for dizziness, tremors, focal weakness, seizures and loss of consciousness.  Endo/Heme/Allergies: Negative for environmental allergies.  Psychiatric/Behavioral: Negative for depression, suicidal ideas and hallucinations.  All other systems reviewed and are negative.  Physical Exam: Blood pressure 116/60, pulse (!) 59, height _0  (1.651 m), weight 147 lb 9.6 oz (67 kg), SpO2 96 %. Gen:      No acute distress HEENT:  EOMI, sclera anicteric Neck:     No masses; no thyromegaly Lungs:    Scattered crackles; normal respiratory effort CV:         Regular rate and rhythm; no murmurs Abd:      + bowel sounds; soft, non-tender; no palpable masses, no distension Ext:    No edema; adequate peripheral perfusion Skin:      Warm and dry; no rash Neuro: alert and oriented x 3 Psych: normal mood and affect  Data Reviewed: Bronchoscopy culture 10/07/16-  Pseudomonas aeruginosa Strep viridans MAI  Echocardiogram 10/12/14 Apical hypokinesis with mildly reduced LV systolic function; mild  LVH; sclerotic aortic valve with mild AI; mild MR; mild biatrial  enlargement; mild RVE; mild TR; mildly elevated pulmonary  pressure.  CT chest 10/05/16 - trace right effusion, multiple rib fractures on R, extensive consolidative changes and RML / LL and LL with narrowing of right mainstem bronchus. Subcarinal lymph node.  CT angiogram 01/02/17-no pulmonary medicine, improved infiltrates in the right lower lobe and right middle lobe. Scattered atelectasis, bronchial wall thickening, small effusions CT chest 04/22/17-minimal pleural effusion, lower lobe opacities consistent with atelectasis with scattered groundglass. I had reviewed the images personally.  PFTs 03/24/17 FVC 2.19 (62%], FEV1 1.61 [54%], F/F 73, TLC 92%, RV/TLC 141%, DLCO 44% Moderate severe obstruction with air trapping, severe diffusion defect.  CBC differential 04/21/17-  absolute eosinophil count < 150  Swallow eval 04/22/17-mild to moderate oropharyngeal dysphagia  Overnight oximetry 05/16/17 Total time 4 hours O2 sat 90% average, low 88%. Pulse 73 average, highest level recorded 109. There are no desaturations.  No oxygen required.  Assessment:  COPD Recurrent respiratory failure with pneumonia. Stable on anoro. Simplify her rescue med regimen by using duo nebs as needed.  Stop albuterol inhaler and Combivent inhaler Continue on prednisone at 10 mg which she is on for polymyalgia rheumatica.  Positive MAI on BAL Bronchoscopy in early 2018 showed MAI.  We had elected not to treat her given her age and frailty.    Chronic aspiration  Swallow study noted with mild to moderate oropharyngeal dysphagia.   Continue on aspiration precautions  End of life care Being seen by palliative care at her nursing home. Code status-DNR  Plan/Recommendations: - Continue anoro, duo nebs as needed - Stop albuterol inhaler and Combivent inhaler - Aspiration precautions.   Marshell Garfinkel MD  Pulmonary and Critical Care Pager [phone] 08/16/2017, 11:05 AM  CC: Lajean Manes, MD

## 2017-08-17 DIAGNOSIS — I5030 Unspecified diastolic (congestive) heart failure: Secondary | ICD-10-CM | POA: Diagnosis not present

## 2017-08-17 DIAGNOSIS — M6281 Muscle weakness (generalized): Secondary | ICD-10-CM | POA: Diagnosis not present

## 2017-08-17 DIAGNOSIS — R262 Difficulty in walking, not elsewhere classified: Secondary | ICD-10-CM | POA: Diagnosis not present

## 2017-08-21 DIAGNOSIS — R0782 Intercostal pain: Secondary | ICD-10-CM | POA: Diagnosis not present

## 2017-08-22 DIAGNOSIS — R079 Chest pain, unspecified: Secondary | ICD-10-CM | POA: Diagnosis not present

## 2017-08-23 DIAGNOSIS — R262 Difficulty in walking, not elsewhere classified: Secondary | ICD-10-CM | POA: Diagnosis not present

## 2017-08-23 DIAGNOSIS — M6281 Muscle weakness (generalized): Secondary | ICD-10-CM | POA: Diagnosis not present

## 2017-08-23 DIAGNOSIS — I5030 Unspecified diastolic (congestive) heart failure: Secondary | ICD-10-CM | POA: Diagnosis not present

## 2017-08-24 ENCOUNTER — Ambulatory Visit (INDEPENDENT_AMBULATORY_CARE_PROVIDER_SITE_OTHER): Payer: Medicare Other | Admitting: *Deleted

## 2017-08-24 ENCOUNTER — Ambulatory Visit: Payer: Medicare Other | Admitting: Cardiovascular Disease

## 2017-08-24 DIAGNOSIS — R262 Difficulty in walking, not elsewhere classified: Secondary | ICD-10-CM | POA: Diagnosis not present

## 2017-08-24 DIAGNOSIS — M6281 Muscle weakness (generalized): Secondary | ICD-10-CM | POA: Diagnosis not present

## 2017-08-24 DIAGNOSIS — I5030 Unspecified diastolic (congestive) heart failure: Secondary | ICD-10-CM | POA: Diagnosis not present

## 2017-08-24 DIAGNOSIS — I442 Atrioventricular block, complete: Secondary | ICD-10-CM

## 2017-08-24 LAB — CUP PACEART INCLINIC DEVICE CHECK
Battery Voltage: 3.07 V
Implantable Lead Implant Date: 19920213
Implantable Lead Serial Number: 23000302
Implantable Pulse Generator Implant Date: 20190220
Lead Channel Impedance Value: 400 Ohm
Lead Channel Pacing Threshold Amplitude: 0.5 V
Lead Channel Pacing Threshold Amplitude: 0.5 V
Lead Channel Pacing Threshold Pulse Width: 0.5 ms
Lead Channel Sensing Intrinsic Amplitude: 2.2 mV
Lead Channel Setting Pacing Amplitude: 2.5 V
Lead Channel Setting Pacing Amplitude: 2.5 V
Lead Channel Setting Pacing Pulse Width: 0.5 ms
Lead Channel Setting Sensing Sensitivity: 4 mV
MDC IDC LEAD IMPLANT DT: 19920213
MDC IDC LEAD LOCATION: 753859
MDC IDC LEAD LOCATION: 753860
MDC IDC MSMT BATTERY REMAINING LONGEVITY: 80 mo
MDC IDC MSMT LEADCHNL RA IMPEDANCE VALUE: 412.5 Ohm
MDC IDC MSMT LEADCHNL RA PACING THRESHOLD AMPLITUDE: 0.5 V
MDC IDC MSMT LEADCHNL RA PACING THRESHOLD AMPLITUDE: 0.5 V
MDC IDC MSMT LEADCHNL RA PACING THRESHOLD PULSEWIDTH: 0.5 ms
MDC IDC MSMT LEADCHNL RA PACING THRESHOLD PULSEWIDTH: 0.5 ms
MDC IDC MSMT LEADCHNL RV PACING THRESHOLD PULSEWIDTH: 0.5 ms
MDC IDC SESS DTM: 20190307171445
MDC IDC STAT BRADY RA PERCENT PACED: 99.68 %
MDC IDC STAT BRADY RV PERCENT PACED: 99.97 %
Pulse Gen Model: 2272
Pulse Gen Serial Number: 8999181

## 2017-08-24 NOTE — Progress Notes (Signed)
Wound check appointment. Staples removed. Wound without redness or edema. Incision edges approximated, wound well healed. Normal device function. Thresholds, sensing, and impedances consistent with implant measurements. Device programmed at chronic output values. Histogram distribution appropriate for patient and level of activity. AMS<30sec. No high ventricular rates noted. Patient educated about wound care. Msg sent to scheduler to make 91 day f/u with [facility].

## 2017-08-25 DIAGNOSIS — R262 Difficulty in walking, not elsewhere classified: Secondary | ICD-10-CM | POA: Diagnosis not present

## 2017-08-25 DIAGNOSIS — M6281 Muscle weakness (generalized): Secondary | ICD-10-CM | POA: Diagnosis not present

## 2017-08-25 DIAGNOSIS — I5030 Unspecified diastolic (congestive) heart failure: Secondary | ICD-10-CM | POA: Diagnosis not present

## 2017-08-28 DIAGNOSIS — M6281 Muscle weakness (generalized): Secondary | ICD-10-CM | POA: Diagnosis not present

## 2017-08-28 DIAGNOSIS — I5030 Unspecified diastolic (congestive) heart failure: Secondary | ICD-10-CM | POA: Diagnosis not present

## 2017-08-28 DIAGNOSIS — R262 Difficulty in walking, not elsewhere classified: Secondary | ICD-10-CM | POA: Diagnosis not present

## 2017-08-29 DIAGNOSIS — I340 Nonrheumatic mitral (valve) insufficiency: Secondary | ICD-10-CM | POA: Diagnosis not present

## 2017-08-29 DIAGNOSIS — I480 Paroxysmal atrial fibrillation: Secondary | ICD-10-CM | POA: Diagnosis not present

## 2017-08-29 DIAGNOSIS — J449 Chronic obstructive pulmonary disease, unspecified: Secondary | ICD-10-CM | POA: Diagnosis not present

## 2017-08-29 DIAGNOSIS — M545 Low back pain: Secondary | ICD-10-CM | POA: Diagnosis not present

## 2017-08-29 DIAGNOSIS — I10 Essential (primary) hypertension: Secondary | ICD-10-CM | POA: Diagnosis not present

## 2017-08-29 DIAGNOSIS — G8929 Other chronic pain: Secondary | ICD-10-CM | POA: Diagnosis not present

## 2017-08-29 DIAGNOSIS — G3184 Mild cognitive impairment, so stated: Secondary | ICD-10-CM | POA: Diagnosis not present

## 2017-08-30 DIAGNOSIS — M6281 Muscle weakness (generalized): Secondary | ICD-10-CM | POA: Diagnosis not present

## 2017-08-30 DIAGNOSIS — I5030 Unspecified diastolic (congestive) heart failure: Secondary | ICD-10-CM | POA: Diagnosis not present

## 2017-08-30 DIAGNOSIS — R262 Difficulty in walking, not elsewhere classified: Secondary | ICD-10-CM | POA: Diagnosis not present

## 2017-08-31 DIAGNOSIS — R262 Difficulty in walking, not elsewhere classified: Secondary | ICD-10-CM | POA: Diagnosis not present

## 2017-08-31 DIAGNOSIS — I5030 Unspecified diastolic (congestive) heart failure: Secondary | ICD-10-CM | POA: Diagnosis not present

## 2017-08-31 DIAGNOSIS — M6281 Muscle weakness (generalized): Secondary | ICD-10-CM | POA: Diagnosis not present

## 2017-09-07 DIAGNOSIS — R600 Localized edema: Secondary | ICD-10-CM | POA: Diagnosis not present

## 2017-09-13 DIAGNOSIS — R600 Localized edema: Secondary | ICD-10-CM | POA: Diagnosis not present

## 2017-09-27 DIAGNOSIS — R531 Weakness: Secondary | ICD-10-CM | POA: Diagnosis not present

## 2017-10-05 ENCOUNTER — Ambulatory Visit (INDEPENDENT_AMBULATORY_CARE_PROVIDER_SITE_OTHER): Payer: Self-pay | Admitting: *Deleted

## 2017-10-05 DIAGNOSIS — I442 Atrioventricular block, complete: Secondary | ICD-10-CM

## 2017-10-05 NOTE — Progress Notes (Signed)
Remote pacemaker transmission.   

## 2017-10-06 ENCOUNTER — Encounter: Payer: Self-pay | Admitting: Cardiology

## 2017-10-11 ENCOUNTER — Non-Acute Institutional Stay: Payer: BLUE CROSS/BLUE SHIELD | Admitting: Hospice and Palliative Medicine

## 2017-10-11 VITALS — BP 105/65 | HR 94 | Resp 22

## 2017-10-11 DIAGNOSIS — Z515 Encounter for palliative care: Secondary | ICD-10-CM | POA: Diagnosis not present

## 2017-10-11 LAB — CUP PACEART REMOTE DEVICE CHECK
Battery Remaining Longevity: 83 mo
Battery Remaining Percentage: 95.5 %
Brady Statistic AP VS Percent: 1 %
Brady Statistic AS VS Percent: 1 %
Brady Statistic RV Percent Paced: 99 %
Implantable Lead Location: 753859
Implantable Lead Location: 753860
Implantable Lead Serial Number: 23000302
Lead Channel Impedance Value: 410 Ohm
Lead Channel Pacing Threshold Amplitude: 0.5 V
Lead Channel Pacing Threshold Amplitude: 0.5 V
Lead Channel Pacing Threshold Pulse Width: 0.5 ms
Lead Channel Pacing Threshold Pulse Width: 0.5 ms
Lead Channel Setting Pacing Amplitude: 2.5 V
MDC IDC LEAD IMPLANT DT: 19920213
MDC IDC LEAD IMPLANT DT: 19920213
MDC IDC MSMT BATTERY VOLTAGE: 2.99 V
MDC IDC MSMT LEADCHNL RA SENSING INTR AMPL: 2 mV
MDC IDC MSMT LEADCHNL RV IMPEDANCE VALUE: 390 Ohm
MDC IDC PG IMPLANT DT: 20190220
MDC IDC PG SERIAL: 8999181
MDC IDC SESS DTM: 20190418105132
MDC IDC SET LEADCHNL RV PACING AMPLITUDE: 2.5 V
MDC IDC SET LEADCHNL RV PACING PULSEWIDTH: 0.5 ms
MDC IDC SET LEADCHNL RV SENSING SENSITIVITY: 4 mV
MDC IDC STAT BRADY AP VP PERCENT: 99 %
MDC IDC STAT BRADY AS VP PERCENT: 1.1 %
MDC IDC STAT BRADY RA PERCENT PACED: 99 %

## 2017-10-11 NOTE — Progress Notes (Signed)
FOLLOW UP PALLIATIVE CARE CONSULT VISIT   PATIENT NAME: [name] DOB: 08-15-1930 MRN: [id number]  REFERRING PROVIDER: Lajean Manes, [address] [place], [place] [address]   RESPONSIBLE PARTY:  Acct ID - Guarantor Home Phone Work Phone Relationship Acct Type  [id number] Melchor [phone]  Self P/F     [address], [place], [place] [address]     BILLABLE ICD-10: Weakness     RECOMMENDATIONS and PLAN:  1.  Weakness: secondary to advanced age, end stage COPD and Afib as well as others. Continues in AL setting. Memory is getting worse but not wandering or advanced enough for memory care setting. She continues to ambulate within her own room and manages with a walker to the dining hall. Continue to assist with adl's. She does not participate well with [place] but exercising would be beneficial for her. Will continue to monitor from a palliative care perspective. 2.  Leg skin tear/bruising: healing. No infection apparent. Recommend using Eucerin cream daily. Cover area with bandaid at HS. 3. Constipation: Requested that staff bring patient prn stool softener... Unable to schedule due to causing stool being too loose. Advised daughter to prompt her mother about her bowel status.  3. ACP: reviewed on previous visit with daughter and patient. DNR status. Desires hospital for acute episodes requiring IV fluids/IV abx therapy.  Bipap only if needed. No feeding tube. No ventilator.  I spent 25 minutes providing this consultation,  from 12:15 pm to 12:40 pm.. More than 50% of the time in this consultation was spent reviewing records, interviewing staff and coordinating communication.    HISTORY OF PRESENT ILLNESS: Visit requested by daughter to evaluate patient's skin tear on lower leg and assess for any decline.  CODE STATUS: DNR  ADVANCED DIRECTIVES: Y  PPS: 50% HOSPICE ELIGIBILITY/DIAGNOSIS: no, not at this time  PHYSICAL EXAM:   GEN: alert and  oriented LUNGS: diminished in bases; no wheezing today CARDIAC: Irreg rhythm; reg rate EXTREMITIES: no edema todat SKIN: thin and easily bruised/ small dime sized area darkened in color no erythema or warmth  NEURO:  Alert and oriented to name, time of day and place; + memory loss; less awareness of details   Nathanial Rancher, NP

## 2017-10-23 DIAGNOSIS — G3184 Mild cognitive impairment, so stated: Secondary | ICD-10-CM | POA: Diagnosis not present

## 2017-10-23 DIAGNOSIS — I10 Essential (primary) hypertension: Secondary | ICD-10-CM | POA: Diagnosis not present

## 2017-10-23 DIAGNOSIS — J449 Chronic obstructive pulmonary disease, unspecified: Secondary | ICD-10-CM | POA: Diagnosis not present

## 2017-11-08 ENCOUNTER — Ambulatory Visit (INDEPENDENT_AMBULATORY_CARE_PROVIDER_SITE_OTHER): Payer: Medicare Other | Admitting: Cardiovascular Disease

## 2017-11-08 ENCOUNTER — Encounter: Payer: Self-pay | Admitting: Cardiovascular Disease

## 2017-11-08 VITALS — BP 132/68 | HR 70 | Ht 65.0 in | Wt 147.6 lb

## 2017-11-08 DIAGNOSIS — I442 Atrioventricular block, complete: Secondary | ICD-10-CM | POA: Diagnosis not present

## 2017-11-08 DIAGNOSIS — I480 Paroxysmal atrial fibrillation: Secondary | ICD-10-CM | POA: Diagnosis not present

## 2017-11-08 DIAGNOSIS — I5032 Chronic diastolic (congestive) heart failure: Secondary | ICD-10-CM | POA: Diagnosis not present

## 2017-11-08 DIAGNOSIS — I495 Sick sinus syndrome: Secondary | ICD-10-CM | POA: Diagnosis not present

## 2017-11-08 DIAGNOSIS — I951 Orthostatic hypotension: Secondary | ICD-10-CM

## 2017-11-08 DIAGNOSIS — Z950 Presence of cardiac pacemaker: Secondary | ICD-10-CM

## 2017-11-08 NOTE — Patient Instructions (Signed)
Dr Sallyanne Kuster recommends that you continue on your current medications as directed. Please refer to the Current Medication list given to you today.  Remote monitoring is used to monitor your Pacemaker or ICD from home. This monitoring reduces the number of office visits required to check your device to one time per year. It allows [place] to keep an eye on the functioning of your device to ensure it is working properly. You are scheduled for a device check from home on Thursday, July 18th, 2019. You may send your transmission at any time that day. If you have a wireless device, the transmission will be sent automatically. After your physician reviews your transmission, you will receive a notification with your next transmission date.  To improve our patient care and to more adequately follow your device, [facility] has decided, as a practice, to start following each patient four times a year with your home monitor. This means that you may experience a remote appointment that is close to an in-office appointment with your physician. Your insurance will apply at the same rate as other remote monitoring transmissions.  Dr Sallyanne Kuster recommends that you schedule a follow-up appointment in 12 months with a pacemaker check. You will receive a reminder letter in the mail two months in advance. If you don't receive a letter, please call our office to schedule the follow-up appointment.  If you need a refill on your cardiac medications before your next appointment, please call your pharmacy.

## 2017-11-08 NOTE — Progress Notes (Signed)
Patient ID: [name], female   DOB: Dec 19, 1930, 84 y.o.   MRN: [id number]    Cardiology Office Note    Date:  11/10/2017   ID:  [name], DOB 28-Apr-1931, MRN [id number]  PCP:  Lajean Manes, MD  Cardiologist:  Sanda Klein, MD   No chief complaint: Pacemaker , recent generator change  History of Present Illness:  [name] is a 84 y.o. female with complete heart block, sinus node dysfunction and remote documentation of asymptomatic paroxysmal atrial fibrillation, diastolic heart failure and mild aortic insufficiency, on chronic prednisone for polymyalgia rheumatica, returning in follow-up roughly 3 months after pacemaker generator change out.  The patient specifically denies any chest pain at rest exertion, dyspnea at rest or with exertion, orthopnea, paroxysmal nocturnal dyspnea, syncope, palpitations, focal neurological deficits, intermittent claudication, lower extremity edema, unexplained weight gain, cough, hemoptysis or wheezing.  Bruising has improved after we discontinued her anticoagulant.  She has a lot of problems with bilateral rib cage pain radiating from her back, strongly suggestive of thoracic nerve radiculopathy. She is known to have had previous compression fractures as well as arthritis symptoms.  Normal pacemaker function, without any detected atrial fibrillation.  She is pacemaker dependent with 100% atrial and 100% ventricular pacing..  She has had varied estimation of the severity of her aortic insufficiency by echocardiography but has a normal size left ventricle with normal left ventricular function and no other significant valvular abnormalities. Most recent echocardiogram showed only mild aortic insufficiency.   Her original pacemaker and the current leads were implanted in 1992. She has had 3 generator change out procedures, most recently in 2019. Her device is a Advertising account planner. The atrial lead is a St. Jude [id number] and the ventricular lead was a  Biotronik [id number].    Past Medical History:  Diagnosis Date  . CHB (complete heart block) ([name]) 07/13/2013   Pacemaker dependent  . CHF (congestive heart failure) ([place])   . COPD (chronic obstructive pulmonary disease) ([place])   . DM (dermatomyositis)   . Orthostatic hypotension   . Pacemaker 07/13/2013   Her original pacemaker and the current leads were implanted in 1992. She has had 2 generator change out, most recently in 2010. Her device is a Buyer, retail 2110 non RF dual-chamber pacemaker with a battery longevity estimated at about 7 years. The atrial lead is a St. Jude 6063 and the ventricular lead was a Biotronik [id number].   [name] Paroxysmal atrial fibrillation ([place]) 09/04/2014   on Eliquis  . Polymyalgia rheumatica ([place])   . Scoliosis   . SSS (sick sinus syndrome) ([place]) 07/13/2013  . Syncope   . Systemic hypertension   . Thoracic kyphosis     Past Surgical History:  Procedure Laterality Date  . IR KYPHO THORACIC WITH BONE BIOPSY  01/04/2017  . IR RADIOLOGIST EVAL & MGMT  01/10/2017  . NM MYOCAR PERF WALL MOTION  09/13/2011   Low risk  . PERMANENT PACEMAKER GENERATOR CHANGE  03/27/2009   St.Jude  . PPM GENERATOR CHANGEOUT N/A 08/09/2017   Procedure: PPM GENERATOR CHANGEOUT;  Surgeon: Sanda Klein, MD;  Location: [facility];  Service: Cardiovascular;  Laterality: N/A;  . US ECHOCARDIOGRAPHY  03/28/2012   Mod LAE,mild MR,aortic sclerosis w/mod AI,mod. TR,mild PI,Stage I diastolic dysfunction  . VIDEO BRONCHOSCOPY Bilateral 10/07/2016   Procedure: VIDEO BRONCHOSCOPY WITH FLUORO;  Surgeon: Marshell Garfinkel, MD;  Location: [facility];  Service: Cardiopulmonary;  Laterality: Bilateral;    Current Medications:  Outpatient Medications Prior to Visit  Medication Sig Dispense Refill  . acetaminophen (TYLENOL) 325 MG tablet Take 325 mg by mouth every 6 (six) hours as needed for mild pain, moderate pain or fever.     [name] albuterol (PROVENTIL) (2.5 MG/3ML) 0.083% nebulizer solution Take  3 mLs (2.5 mg total) by nebulization every 6 (six) hours as needed for wheezing or shortness of breath. 75 mL 12  . alendronate (FOSAMAX) 70 MG tablet Take 70 mg by mouth every Saturday. Take with a full glass of water on an empty stomach.    . ALPRAZolam (XANAX) 0.25 MG tablet Take 1 tablet (0.25 mg total) at bedtime as needed by mouth for sleep. (Patient taking differently: Take 0.25 mg by mouth at bedtime. ) 15 tablet 0  . docusate sodium (COLACE) 100 MG capsule Take 1 capsule (100 mg total) daily as needed by mouth for mild constipation. 10 capsule 0  . escitalopram (LEXAPRO) 10 MG tablet Take 10 mg by mouth daily.    . furosemide (LASIX) 40 MG tablet Take 0.5 tablets (20 mg total) daily by mouth. (Patient taking differently: Take 40 mg by mouth daily as needed (for weight gain greater than 2lbs). ) 30 tablet 1  . HYDROcodone-acetaminophen (NORCO/VICODIN) 5-325 MG tablet Take 1-2 tablets every 4 (four) hours as needed by mouth for moderate pain. (Patient taking differently: Take 1 tablet by mouth See admin instructions. Take 1 tablet by mouth daily at 1000. Take 1 tablet by mouth every 8 hours as needed for pain) 15 tablet 0  . ipratropium-albuterol (DUONEB) 0.5-2.5 (3) MG/3ML SOLN Take 3 mLs by nebulization every 6 (six) hours as needed (for wheezing).    . metoprolol succinate (TOPROL-XL) 25 MG 24 hr tablet Take 25 mg by mouth daily.   3  . midodrine (PROAMATINE) 2.5 MG tablet Take 2.5 mg by mouth as needed (when systolic BP is lower than 100).     . mirtazapine (REMERON) 15 MG tablet Take 7.5 mg by mouth at bedtime.     . ondansetron (ZOFRAN) 4 MG tablet Take 4 mg by mouth every 6 (six) hours as needed for nausea or vomiting.    . potassium chloride SA (K-DUR,KLOR-CON) 20 MEQ tablet Take 1 tablet (20 mEq total) daily by mouth. (Patient taking differently: Take 20 mEq by mouth daily as needed (with Lasix). ) 30 tablet 0  . predniSONE (DELTASONE) 10 MG tablet Take 6 tablets once daily for 3 days,  then take 4 tablets once daily for 3 days, then take 2 tablets once daily for 3 days and then 1 tablet once daily as before. (Patient taking differently: 10 mg daily. ) 60 tablet 0  . umeclidinium-vilanterol (ANORO ELLIPTA) 62.5-25 MCG/INH AEPB Inhale 1 puff into the lungs daily. 60 each 5  . apixaban (ELIQUIS) 2.5 MG TABS tablet Take 1 tablet (2.5 mg total) by mouth 2 (two) times daily. Start on 08/10/2017 in AM 180 tablet 1  . Ipratropium-Albuterol (COMBIVENT RESPIMAT) 20-100 MCG/ACT AERS respimat Inhale 1 puff into the lungs every 6 (six) hours as needed for wheezing or shortness of breath. 1 Inhaler 2   No facility-administered medications prior to visit.      Allergies:   Patient has no known allergies.   Social History   Socioeconomic History  . Marital status: Married    Spouse name: Not on file  . Number of children: Not on file  . Years of education: Not on file  . Highest education level: Not on  file  Occupational History  . Occupation: retired  Scientific laboratory technician  . Financial resource strain: Not on file  . Food insecurity:    Worry: Not on file    Inability: Not on file  . Transportation needs:    Medical: Not on file    Non-medical: Not on file  Tobacco Use  . Smoking status: Former Smoker    Packs/day: 0.50    Years: 60.00    Pack years: 30.00    Types: Cigarettes  . Smokeless tobacco: Never Used  . Tobacco comment: quit 12/2016  Substance and Sexual Activity  . Alcohol use: No  . Drug use: No  . Sexual activity: Not Currently    Partners: Female  Lifestyle  . Physical activity:    Days per week: Not on file    Minutes per session: Not on file  . Stress: Not on file  Relationships  . Social connections:    Talks on phone: Not on file    Gets together: Not on file    Attends religious service: Not on file    Active member of club or organization: Not on file    Attends meetings of clubs or organizations: Not on file    Relationship status: Not on file  Other  Topics Concern  . Not on file  Social History Narrative  . Not on file       ROS:   Please see the history of present illness.    ROS All other systems reviewed and are negative.   PHYSICAL EXAM:   VS:  BP 132/68   Pulse 70   Ht 5\' 5"  (1.651 m)   Wt 147 lb 9.6 oz (67 kg)   BMI 24.56 kg/m     General: Alert, oriented x3, no distress, but clearly uncomfortable and finding hard to adjust into a comfortable position due to back pain Head: no evidence of trauma, PERRL, EOMI, no exophtalmos or lid lag, no myxedema, no xanthelasma; normal ears, nose and oropharynx Neck: normal jugular venous pulsations and no hepatojugular reflux; brisk carotid pulses without delay and no carotid bruits Chest: clear to auscultation, no signs of consolidation by percussion or palpation, normal fremitus, symmetrical and full respiratory excursions.  Well-healed pacemaker site without evidence of redness, warmth or hematoma. Cardiovascular: normal position and quality of the apical impulse, regular rhythm, normal first and paradoxically split second heart sounds, no murmurs, rubs or gallops Abdomen: no tenderness or distention, no masses by palpation, no abnormal pulsatility or arterial bruits, normal bowel sounds, no hepatosplenomegaly Extremities: no clubbing, cyanosis or edema; 2+ radial, ulnar and brachial pulses bilaterally; 2+ right femoral, posterior tibial and dorsalis pedis pulses; 2+ left femoral, posterior tibial and dorsalis pedis pulses; no subclavian or femoral bruits Neurological: grossly nonfocal Psych: Normal mood and affect   Wt Readings from Last 3 Encounters:  11/08/17 147 lb 9.6 oz (67 kg)  08/16/17 147 lb 9.6 oz (67 kg)  08/09/17 150 lb (68 kg)      Studies/Labs Reviewed:   EKG:  EKG is ordered today.  It demonstrates AV sequential pacing.  QTc 406 ms Recent Labs: 04/21/2017: ALT 16; B Natriuretic Peptide 917.2 04/23/2017: Hemoglobin 11.4; Platelets 337 04/25/2017: BUN 30;  Creatinine, Ser 0.75; Potassium 3.9; Sodium 143   ASSESSMENT:    1. Paroxysmal atrial fibrillation ([facility])   2. SSS (sick sinus syndrome) ([place])   3. CHB (complete heart block) ([facility])   4. Pacemaker   5. Chronic diastolic heart failure ([place])  6. Orthostatic hypotension      PLAN:  In order of problems listed above:  1. AFib: Her episodes are very infrequent.  Anticoagulation has been stopped due to severe bruising and risk of falls.  We are using her device to monitor for recurrent atrial fibrillation and may restart anticoagulation if necessary. CHADSVasc 5 (age 37, gender, heart failure, hypertension).  2. SSS: Essentially 100% atrial pacing.  Relatively sedentary.  Heart rate histogram is appropriate. 3. CHB: She is pacemaker dependent, 100% V pacing 4. PPM: Well-healed after generator change out about 3 months ago 5. CHF: Preserved LVEF, tiny dose of loop diuretic, clinically euvolemic.  Avoid excessive diuresis due to orthostatic hypotension. 6. Orthostatic hypotension: seems to be doing well on midodrine.  Without recent complaints of dizziness or falls.    Medication Adjustments/Labs and Tests Ordered: Current medicines are reviewed at length with the patient today.  Concerns regarding medicines are outlined above.  Medication changes, Labs and Tests ordered today are listed in the Patient Instructions below. Patient Instructions  Dr Sallyanne Kuster recommends that you continue on your current medications as directed. Please refer to the Current Medication list given to you today.  Remote monitoring is used to monitor your Pacemaker or ICD from home. This monitoring reduces the number of office visits required to check your device to one time per year. It allows [place] to keep an eye on the functioning of your device to ensure it is working properly. You are scheduled for a device check from home on Thursday, July 18th, 2019. You may send your transmission at any time that day. If you have a  wireless device, the transmission will be sent automatically. After your physician reviews your transmission, you will receive a notification with your next transmission date.  To improve our patient care and to more adequately follow your device, [facility] has decided, as a practice, to start following each patient four times a year with your home monitor. This means that you may experience a remote appointment that is close to an in-office appointment with your physician. Your insurance will apply at the same rate as other remote monitoring transmissions.  Dr Sallyanne Kuster recommends that you schedule a follow-up appointment in 12 months with a pacemaker check. You will receive a reminder letter in the mail two months in advance. If you don't receive a letter, please call our office to schedule the follow-up appointment.  If you need a refill on your cardiac medications before your next appointment, please call your pharmacy.    Signed, Sanda Klein, MD  11/10/2017 9:26 AM    [facility] [address], [place], [place]  [address] Phone: [phone]; Fax: [phone]

## 2017-11-10 ENCOUNTER — Encounter: Payer: Self-pay | Admitting: Cardiovascular Disease

## 2017-11-20 ENCOUNTER — Other Ambulatory Visit: Payer: Self-pay

## 2017-11-20 ENCOUNTER — Emergency Department (HOSPITAL_COMMUNITY): Payer: Medicare Other

## 2017-11-20 ENCOUNTER — Encounter (HOSPITAL_COMMUNITY): Payer: Self-pay

## 2017-11-20 ENCOUNTER — Inpatient Hospital Stay (HOSPITAL_COMMUNITY)
Admission: EM | Admit: 2017-11-20 | Discharge: 2017-11-24 | DRG: 193 | Disposition: A | Discharge status: Home Health | Payer: Medicare Other | Attending: Internal Medicine | Admitting: Internal Medicine

## 2017-11-20 DIAGNOSIS — I2721 Secondary pulmonary arterial hypertension: Secondary | ICD-10-CM | POA: Diagnosis present

## 2017-11-20 DIAGNOSIS — I495 Sick sinus syndrome: Secondary | ICD-10-CM | POA: Diagnosis not present

## 2017-11-20 DIAGNOSIS — Z79899 Other long term (current) drug therapy: Secondary | ICD-10-CM

## 2017-11-20 DIAGNOSIS — F0390 Unspecified dementia without behavioral disturbance: Secondary | ICD-10-CM | POA: Diagnosis present

## 2017-11-20 DIAGNOSIS — R062 Wheezing: Secondary | ICD-10-CM | POA: Diagnosis not present

## 2017-11-20 DIAGNOSIS — Z87891 Personal history of nicotine dependence: Secondary | ICD-10-CM

## 2017-11-20 DIAGNOSIS — M353 Polymyalgia rheumatica: Secondary | ICD-10-CM | POA: Diagnosis present

## 2017-11-20 DIAGNOSIS — I2510 Atherosclerotic heart disease of native coronary artery without angina pectoris: Secondary | ICD-10-CM | POA: Diagnosis present

## 2017-11-20 DIAGNOSIS — Z66 Do not resuscitate: Secondary | ICD-10-CM | POA: Diagnosis present

## 2017-11-20 DIAGNOSIS — I5032 Chronic diastolic (congestive) heart failure: Secondary | ICD-10-CM | POA: Diagnosis present

## 2017-11-20 DIAGNOSIS — I700 Atherosclerosis of aorta: Secondary | ICD-10-CM | POA: Diagnosis present

## 2017-11-20 DIAGNOSIS — Z7983 Long term (current) use of bisphosphonates: Secondary | ICD-10-CM | POA: Diagnosis not present

## 2017-11-20 DIAGNOSIS — J440 Chronic obstructive pulmonary disease with acute lower respiratory infection: Secondary | ICD-10-CM | POA: Diagnosis present

## 2017-11-20 DIAGNOSIS — R946 Abnormal results of thyroid function studies: Secondary | ICD-10-CM | POA: Diagnosis present

## 2017-11-20 DIAGNOSIS — J181 Lobar pneumonia, unspecified organism: Secondary | ICD-10-CM | POA: Diagnosis not present

## 2017-11-20 DIAGNOSIS — F419 Anxiety disorder, unspecified: Secondary | ICD-10-CM | POA: Diagnosis present

## 2017-11-20 DIAGNOSIS — L219 Seborrheic dermatitis, unspecified: Secondary | ICD-10-CM | POA: Diagnosis present

## 2017-11-20 DIAGNOSIS — J90 Pleural effusion, not elsewhere classified: Secondary | ICD-10-CM | POA: Diagnosis not present

## 2017-11-20 DIAGNOSIS — J9601 Acute respiratory failure with hypoxia: Secondary | ICD-10-CM | POA: Diagnosis present

## 2017-11-20 DIAGNOSIS — I480 Paroxysmal atrial fibrillation: Secondary | ICD-10-CM | POA: Diagnosis not present

## 2017-11-20 DIAGNOSIS — I5022 Chronic systolic (congestive) heart failure: Secondary | ICD-10-CM | POA: Diagnosis present

## 2017-11-20 DIAGNOSIS — Z7952 Long term (current) use of systemic steroids: Secondary | ICD-10-CM

## 2017-11-20 DIAGNOSIS — Z515 Encounter for palliative care: Secondary | ICD-10-CM | POA: Diagnosis not present

## 2017-11-20 DIAGNOSIS — B9789 Other viral agents as the cause of diseases classified elsewhere: Secondary | ICD-10-CM | POA: Diagnosis present

## 2017-11-20 DIAGNOSIS — I442 Atrioventricular block, complete: Secondary | ICD-10-CM | POA: Diagnosis not present

## 2017-11-20 DIAGNOSIS — I110 Hypertensive heart disease with heart failure: Secondary | ICD-10-CM | POA: Diagnosis present

## 2017-11-20 DIAGNOSIS — D509 Iron deficiency anemia, unspecified: Secondary | ICD-10-CM | POA: Diagnosis present

## 2017-11-20 DIAGNOSIS — Z950 Presence of cardiac pacemaker: Secondary | ICD-10-CM | POA: Diagnosis present

## 2017-11-20 DIAGNOSIS — J189 Pneumonia, unspecified organism: Secondary | ICD-10-CM | POA: Diagnosis present

## 2017-11-20 DIAGNOSIS — Y95 Nosocomial condition: Secondary | ICD-10-CM | POA: Diagnosis present

## 2017-11-20 DIAGNOSIS — R531 Weakness: Secondary | ICD-10-CM | POA: Diagnosis not present

## 2017-11-20 DIAGNOSIS — J449 Chronic obstructive pulmonary disease, unspecified: Secondary | ICD-10-CM | POA: Diagnosis not present

## 2017-11-20 DIAGNOSIS — M40204 Unspecified kyphosis, thoracic region: Secondary | ICD-10-CM | POA: Diagnosis present

## 2017-11-20 DIAGNOSIS — Z7189 Other specified counseling: Secondary | ICD-10-CM | POA: Diagnosis not present

## 2017-11-20 DIAGNOSIS — R05 Cough: Secondary | ICD-10-CM | POA: Diagnosis not present

## 2017-11-20 DIAGNOSIS — F329 Major depressive disorder, single episode, unspecified: Secondary | ICD-10-CM | POA: Diagnosis present

## 2017-11-20 DIAGNOSIS — I10 Essential (primary) hypertension: Secondary | ICD-10-CM | POA: Diagnosis not present

## 2017-11-20 DIAGNOSIS — R0902 Hypoxemia: Secondary | ICD-10-CM | POA: Diagnosis not present

## 2017-11-20 LAB — CBC
HEMATOCRIT: 35.3 % — AB (ref 36.0–46.0)
HEMOGLOBIN: 10.5 g/dL — AB (ref 12.0–15.0)
MCH: 25.8 pg — ABNORMAL LOW (ref 26.0–34.0)
MCHC: 29.7 g/dL — AB (ref 30.0–36.0)
MCV: 86.7 fL (ref 78.0–100.0)
Platelets: 229 10*3/uL (ref 150–400)
RBC: 4.07 MIL/uL (ref 3.87–5.11)
RDW: 17.6 % — ABNORMAL HIGH (ref 11.5–15.5)
WBC: 10.8 10*3/uL — ABNORMAL HIGH (ref 4.0–10.5)

## 2017-11-20 LAB — BASIC METABOLIC PANEL
ANION GAP: 10 (ref 5–15)
BUN: 17 mg/dL (ref 6–20)
CHLORIDE: 104 mmol/L (ref 101–111)
CO2: 26 mmol/L (ref 22–32)
Calcium: 8.2 mg/dL — ABNORMAL LOW (ref 8.9–10.3)
Creatinine, Ser: 0.8 mg/dL (ref 0.44–1.00)
GFR calc Af Amer: >60 mL/min (ref >60)
GFR calc non Af Amer: >60 mL/min (ref >60)
GLUCOSE: 122 mg/dL — AB (ref 65–99)
Potassium: 3.6 mmol/L (ref 3.5–5.1)
Sodium: 140 mmol/L (ref 135–145)

## 2017-11-20 LAB — MRSA PCR SCREENING: MRSA BY PCR: NEGATIVE

## 2017-11-20 LAB — BRAIN NATRIURETIC PEPTIDE: B Natriuretic Peptide: 706.9 pg/mL — ABNORMAL HIGH (ref 0.0–100.0)

## 2017-11-20 LAB — TROPONIN I: TROPONIN I: 0.04 ng/mL — AB (ref <0.03)

## 2017-11-20 MED ORDER — ALBUTEROL SULFATE (2.5 MG/3ML) 0.083% IN NEBU
5.0000 mg | INHALATION_SOLUTION | Freq: Once | RESPIRATORY_TRACT | Status: AC
  Administered 2017-11-20: 5 mg via RESPIRATORY_TRACT
  Filled 2017-11-20: qty 6

## 2017-11-20 MED ORDER — SODIUM CHLORIDE 0.9 % IV SOLN
1.0000 g | Freq: Two times a day (BID) | INTRAVENOUS | Status: DC
  Administered 2017-11-20 – 2017-11-21 (×2): 1 g via INTRAVENOUS
  Filled 2017-11-20 (×2): qty 1

## 2017-11-20 MED ORDER — POTASSIUM CHLORIDE CRYS ER 20 MEQ PO TBCR
20.0000 meq | EXTENDED_RELEASE_TABLET | Freq: Every day | ORAL | Status: DC
  Administered 2017-11-20 – 2017-11-24 (×5): 20 meq via ORAL
  Filled 2017-11-20 (×5): qty 1

## 2017-11-20 MED ORDER — SODIUM CHLORIDE 0.9 % IV SOLN
1.0000 g | Freq: Once | INTRAVENOUS | Status: DC
  Administered 2017-11-20: 1 g via INTRAVENOUS
  Filled 2017-11-20: qty 10

## 2017-11-20 MED ORDER — SODIUM CHLORIDE 0.9 % IV SOLN
500.0000 mg | Freq: Once | INTRAVENOUS | Status: DC
  Administered 2017-11-20: 500 mg via INTRAVENOUS
  Filled 2017-11-20: qty 500

## 2017-11-20 MED ORDER — ALPRAZOLAM 0.25 MG PO TABS
0.2500 mg | ORAL_TABLET | Freq: Every evening | ORAL | Status: DC | PRN
  Administered 2017-11-22: 0.25 mg via ORAL
  Filled 2017-11-20: qty 1

## 2017-11-20 MED ORDER — DOCUSATE SODIUM 100 MG PO CAPS: 100.0000 mg | ORAL_CAPSULE | Freq: Every day | ORAL | Status: DC | PRN

## 2017-11-20 MED ORDER — METOPROLOL SUCCINATE ER 25 MG PO TB24
25.0000 mg | ORAL_TABLET | Freq: Every day | ORAL | Status: DC
  Administered 2017-11-21 – 2017-11-24 (×4): 25 mg via ORAL
  Filled 2017-11-20 (×5): qty 1

## 2017-11-20 MED ORDER — UMECLIDINIUM-VILANTEROL 62.5-25 MCG/INH IN AEPB
1.0000 | INHALATION_SPRAY | Freq: Every day | RESPIRATORY_TRACT | Status: DC
  Administered 2017-11-21: 1 via RESPIRATORY_TRACT
  Filled 2017-11-20: qty 14

## 2017-11-20 MED ORDER — HYDROCODONE-ACETAMINOPHEN 5-325 MG PO TABS: 1.0000 | ORAL_TABLET | ORAL | Status: DC | PRN

## 2017-11-20 MED ORDER — IPRATROPIUM-ALBUTEROL 0.5-2.5 (3) MG/3ML IN SOLN
3.0000 mL | Freq: Four times a day (QID) | RESPIRATORY_TRACT | Status: DC
  Administered 2017-11-20: 3 mL via RESPIRATORY_TRACT
  Filled 2017-11-20: qty 3

## 2017-11-20 MED ORDER — ESCITALOPRAM OXALATE 10 MG PO TABS
10.0000 mg | ORAL_TABLET | Freq: Every day | ORAL | Status: DC
  Administered 2017-11-21 – 2017-11-24 (×4): 10 mg via ORAL
  Filled 2017-11-20 (×4): qty 1

## 2017-11-20 MED ORDER — KETOCONAZOLE 2 % EX CREA
TOPICAL_CREAM | Freq: Two times a day (BID) | CUTANEOUS | Status: DC
  Administered 2017-11-20 – 2017-11-22 (×5): via TOPICAL
  Filled 2017-11-20: qty 15

## 2017-11-20 MED ORDER — VANCOMYCIN HCL IN DEXTROSE 1-5 GM/200ML-% IV SOLN
1000.0000 mg | Freq: Once | INTRAVENOUS | Status: AC
  Administered 2017-11-20: 1000 mg via INTRAVENOUS
  Filled 2017-11-20: qty 200

## 2017-11-20 MED ORDER — ENOXAPARIN SODIUM 40 MG/0.4ML ~~LOC~~ SOLN
40.0000 mg | SUBCUTANEOUS | Status: DC
  Administered 2017-11-20 – 2017-11-23 (×4): 40 mg via SUBCUTANEOUS
  Filled 2017-11-20 (×4): qty 0.4

## 2017-11-20 MED ORDER — MIRTAZAPINE 15 MG PO TABS
7.5000 mg | ORAL_TABLET | Freq: Every day | ORAL | Status: DC
  Administered 2017-11-20 – 2017-11-23 (×4): 7.5 mg via ORAL
  Filled 2017-11-20 (×4): qty 1

## 2017-11-20 MED ORDER — VANCOMYCIN HCL IN DEXTROSE 1-5 GM/200ML-% IV SOLN
1000.0000 mg | INTRAVENOUS | Status: DC
  Administered 2017-11-21: 1000 mg via INTRAVENOUS
  Filled 2017-11-20: qty 200

## 2017-11-20 MED ORDER — FUROSEMIDE 20 MG PO TABS
20.0000 mg | ORAL_TABLET | Freq: Every day | ORAL | Status: DC
  Administered 2017-11-21 – 2017-11-24 (×4): 20 mg via ORAL
  Filled 2017-11-20 (×4): qty 1

## 2017-11-20 MED ORDER — ONDANSETRON HCL 4 MG PO TABS
4.0000 mg | ORAL_TABLET | Freq: Four times a day (QID) | ORAL | Status: DC | PRN
  Administered 2017-11-22: 4 mg via ORAL
  Filled 2017-11-20: qty 1

## 2017-11-20 NOTE — H&P (Addendum)
History and Physical    [name] CHE:[id number] DOB: 12-03-1930 DOA: 11/20/2017  PCP: Lajean Manes, MD Patient coming from: Assisted living  Chief Complaint: Shortness of breath  HPI: [name] is a 84 y.o. female with medical history significant of sick sinus syndrome and complete heart block status post pacemaker, paroxysmal atrial fibrillation, COPD, hypertension, recurrent pneumonia, dementia, anxiety.  Unable to  obtain complete history secondary to patient's underlying dementia.  Patient states that she woke up today feeling very short of breath.  She states that she has been having increased shortness of breath over the last 3 days with coughing and sputum production that is clear.  She does not report any wheezing.  ED Course: Vitals: Temperature of 99.1 F, pulse of 70, respirations in low 20s, blood pressure is hypertensive in 140s to 170s/60s to 70s, on supplemental oxygen Labs: Glucose of 112, BNP of 706.9, troponin of 0.04, white blood cell count of 10.8k, hemoglobin of 10.5 Imaging: CT chest significant for right middle lobe collapse versus consolidation.  Per report, unable to exclude obstructing lesion.  Also significant for bilateral pleural effusions Medications/Course: Ceftriaxone, azithromycin, albuterol given in the emergency department  Review of Systems: Review of Systems  Constitutional: Negative for chills and fever.  Respiratory: Positive for cough, sputum production and shortness of breath. Negative for wheezing.   Cardiovascular: Negative for chest pain and palpitations.  Gastrointestinal: Positive for nausea. Negative for abdominal pain, constipation, diarrhea and vomiting.  All other systems reviewed and are negative.   Past Medical History:  Diagnosis Date  . CHB (complete heart block) ([place]) 07/13/2013   Pacemaker dependent  . CHF (congestive heart failure) ([place])   . COPD (chronic obstructive pulmonary disease) ([place])   . DM (dermatomyositis)    . Orthostatic hypotension   . Pacemaker 07/13/2013   Her original pacemaker and the current leads were implanted in 1992. She has had 2 generator change out, most recently in 2010. Her device is a Buyer, retail 2110 non RF dual-chamber pacemaker with a battery longevity estimated at about 7 years. The atrial lead is a St. Jude [id number] and the ventricular lead was a Biotronik [id number].   [name] Paroxysmal atrial fibrillation ([place]) 09/04/2014   on Eliquis  . Polymyalgia rheumatica ([place])   . Scoliosis   . SSS (sick sinus syndrome) ([place]) 07/13/2013  . Syncope   . Systemic hypertension   . Thoracic kyphosis     Past Surgical History:  Procedure Laterality Date  . IR KYPHO THORACIC WITH BONE BIOPSY  01/04/2017  . IR RADIOLOGIST EVAL & MGMT  01/10/2017  . NM MYOCAR PERF WALL MOTION  09/13/2011   Low risk  . PERMANENT PACEMAKER GENERATOR CHANGE  03/27/2009   St.Jude  . PPM GENERATOR CHANGEOUT N/A 08/09/2017   Procedure: PPM GENERATOR CHANGEOUT;  Surgeon: Sanda Klein, MD;  Location: [facility];  Service: Cardiovascular;  Laterality: N/A;  . US ECHOCARDIOGRAPHY  03/28/2012   Mod LAE,mild MR,aortic sclerosis w/mod AI,mod. TR,mild PI,Stage I diastolic dysfunction  . VIDEO BRONCHOSCOPY Bilateral 10/07/2016   Procedure: VIDEO BRONCHOSCOPY WITH FLUORO;  Surgeon: Marshell Garfinkel, MD;  Location: [facility];  Service: Cardiopulmonary;  Laterality: Bilateral;     reports that she has quit smoking. Her smoking use included cigarettes. She has a 30.00 pack-year smoking history. She has never used smokeless tobacco. She reports that she does not drink alcohol or use drugs.  No Known Allergies  Family History  Problem Relation Age of Onset  .  Stroke Mother     Prior to Admission medications   Medication Sig Start Date End Date Taking? Authorizing Provider  acetaminophen (TYLENOL) 325 MG tablet Take 325 mg by mouth every 6 (six) hours as needed for mild pain, moderate pain or fever.    Yes [provider]  alendronate (FOSAMAX) 70 MG tablet Take 70 mg by mouth every Saturday. Take with a full glass of water on an empty stomach.   Yes [provider]  ALPRAZolam (XANAX) 0.25 MG tablet Take 1 tablet (0.25 mg total) at bedtime as needed by mouth for sleep. 04/25/17  Yes Bonnielee Haff, MD  docusate sodium (COLACE) 100 MG capsule Take 1 capsule (100 mg total) daily as needed by mouth for mild constipation. 04/25/17  Yes Bonnielee Haff, MD  escitalopram (LEXAPRO) 10 MG tablet Take 10 mg by mouth daily.   Yes [provider]  furosemide (LASIX) 40 MG tablet Take 0.5 tablets (20 mg total) daily by mouth. Patient taking differently: Take 40 mg by mouth daily as needed (for weight gain greater than 2lbs).  04/25/17  Yes Bonnielee Haff, MD  HYDROcodone-acetaminophen (NORCO/VICODIN) 5-325 MG tablet Take 1-2 tablets every 4 (four) hours as needed by mouth for moderate pain. Patient taking differently: Take 1 tablet by mouth daily.  04/25/17  Yes Bonnielee Haff, MD  ipratropium-albuterol (DUONEB) 0.5-2.5 (3) MG/3ML SOLN Take 3 mLs by nebulization every 6 (six) hours as needed (for wheezing).   Yes [provider]  metoprolol succinate (TOPROL-XL) 25 MG 24 hr tablet Take 25 mg by mouth daily.    Yes [provider]  midodrine (PROAMATINE) 2.5 MG tablet Take 2.5 mg by mouth as needed (when systolic BP is lower than 100).    Yes [provider]  mirtazapine (REMERON) 15 MG tablet Take 7.5 mg by mouth at bedtime.    Yes [provider]  ondansetron (ZOFRAN) 4 MG tablet Take 4 mg by mouth every 6 (six) hours as needed for nausea or vomiting.   Yes [provider]  potassium chloride SA (K-DUR,KLOR-CON) 20 MEQ tablet Take 1 tablet (20 mEq total) daily by mouth. Patient taking differently: Take 20 mEq by mouth daily as needed (with Lasix).  04/25/17  Yes Bonnielee Haff, MD  predniSONE (DELTASONE) 10 MG tablet Take 6 tablets once daily for 3  days, then take 4 tablets once daily for 3 days, then take 2 tablets once daily for 3 days and then 1 tablet once daily as before. Patient taking differently: 10 mg daily.  04/25/17  Yes Bonnielee Haff, MD  umeclidinium-vilanterol [facility] ELLIPTA) 62.5-25 MCG/INH AEPB Inhale 1 puff into the lungs daily. 08/16/17  Yes Marshell Garfinkel, MD    Physical Exam:  Physical Exam  Constitutional: She appears well-developed and well-nourished. No distress.  HENT:  Mouth/Throat: Oropharynx is clear and moist.  Eyes: Pupils are equal, round, and reactive to light. Conjunctivae and EOM are normal.  Neck: Normal range of motion. No JVD present.  Cardiovascular: Normal rate, regular rhythm and normal heart sounds.  No murmur heard. Pulmonary/Chest: Effort normal. No respiratory distress. She has decreased breath sounds. She has wheezes. She has rhonchi. She has rales.  Abdominal: Soft. Bowel sounds are normal. She exhibits no distension. There is no tenderness. There is no rebound and no guarding.  Musculoskeletal: Normal range of motion. She exhibits no edema or tenderness.       Right lower leg: Normal.       Left lower leg: Normal.  Lymphadenopathy:    She has no cervical adenopathy.  Neurological: She is alert. She has normal strength.  Skin: Skin is warm and dry. She is not diaphoretic.  Psychiatric: She has a normal mood and affect. Her behavior is normal.     Labs on Admission: I have personally reviewed following labs and imaging studies  CBC: Recent Labs  Lab 11/20/17 0944  WBC 10.8*  HGB 10.5*  HCT 35.3*  MCV 86.7  PLT 301    Basic Metabolic Panel: Recent Labs  Lab 11/20/17 0944  NA 140  K 3.6  CL 104  CO2 26  GLUCOSE 122*  BUN 17  CREATININE 0.80  CALCIUM 8.2*    GFR: Estimated Creatinine Clearance: 47.1 mL/min (by C-G formula based on SCr of 0.8 mg/dL).  Liver Function Tests: No results for input(s): AST, ALT, ALKPHOS, BILITOT, PROT, ALBUMIN in the last 168  hours. No results for input(s): LIPASE, AMYLASE in the last 168 hours. No results for input(s): AMMONIA in the last 168 hours.  Coagulation Profile: No results for input(s): INR, PROTIME in the last 168 hours.  Cardiac Enzymes: Recent Labs  Lab 11/20/17 0944  TROPONINI 0.04*    BNP (last 3 results) No results for input(s): PROBNP in the last 8760 hours.  HbA1C: No results for input(s): HGBA1C in the last 72 hours.  CBG: No results for input(s): GLUCAP in the last 168 hours.  Lipid Profile: No results for input(s): CHOL, HDL, LDLCALC, TRIG, CHOLHDL, LDLDIRECT in the last 72 hours.  Thyroid Function Tests: No results for input(s): TSH, T4TOTAL, FREET4, T3FREE, THYROIDAB in the last 72 hours.  Anemia Panel: No results for input(s): VITAMINB12, FOLATE, FERRITIN, TIBC, IRON, RETICCTPCT in the last 72 hours.  Urine analysis:    Component Value Date/Time   COLORURINE YELLOW 10/11/2016 0033   APPEARANCEUR CLEAR 10/11/2016 0033   LABSPEC 1.016 10/11/2016 0033   PHURINE 6.0 10/11/2016 0033   GLUCOSEU NEGATIVE 10/11/2016 0033   HGBUR SMALL (A) 10/11/2016 0033   BILIRUBINUR NEGATIVE 10/11/2016 0033   KETONESUR NEGATIVE 10/11/2016 0033   PROTEINUR NEGATIVE 10/11/2016 0033   UROBILINOGEN 1.0 01/19/2011 1030   NITRITE NEGATIVE 10/11/2016 0033   LEUKOCYTESUR NEGATIVE 10/11/2016 0033     Radiological Exams on Admission: Dg Chest 2 View  Result Date: 11/20/2017 CLINICAL DATA:  Cough EXAM: CHEST - 2 VIEW COMPARISON:  Chest radiograph April 21, 2017 and chest CT April 22, 2017 FINDINGS: There is a small pleural effusion on the left. There is atelectatic change in the lung bases. There is no frank consolidation. Heart is upper normal in size with pulmonary vascularity normal. No adenopathy. Pacemaker leads are attached the right atrium and right ventricle. Patient is undergone kyphoplasty procedure in the lower thoracic region. IMPRESSION: Small left pleural effusion with patchy  atelectasis in both lower lobes. No frank consolidation. Heart upper normal in size. There is aortic atherosclerosis. Pacemaker leads attached right atrium and right ventricle. Aortic Atherosclerosis (ICD10-I70.0). Electronically Signed   By: Lowella Grip III M.D.   On: 11/20/2017 11:00   Ct Chest Wo Contrast  Result Date: 11/20/2017 CLINICAL DATA:  Worsening shortness of breath.  Cough, COPD. EXAM: CT CHEST WITHOUT CONTRAST TECHNIQUE: Multidetector CT imaging of the chest was performed following the standard protocol without IV contrast. COMPARISON:  04/22/2017. FINDINGS: Cardiovascular: Atherosclerotic calcification of the arterial vasculature, including three-vessel involvement of the coronary arteries. Pulmonary arteries and heart are enlarged. No pericardial effusion. Mediastinum/Nodes: Mediastinal lymph nodes are not enlarged by CT  size criteria. Hilar regions are difficult to evaluate without IV contrast. No axillary adenopathy. Esophagus is grossly unremarkable. Lungs/Pleura: Mild centrilobular emphysema. Right middle lobe collapse/consolidation. Mild smooth septal thickening with small bilateral pleural effusions. Compressive atelectasis in both lower lobes. There may be post infectious/postinflammatory scarring in the peripheral right lower lobe. Mild peribronchial thickening. Upper Abdomen: Numerous low-attenuation lesions are again seen in the liver, measuring up to 3.4 cm, similar. Visualized portions of the adrenal glands, kidneys, spleen and stomach are grossly unremarkable. No upper abdominal adenopathy. Musculoskeletal: Degenerative changes in the spine. T8 compression fracture and T9 vertebral body augmentation are unchanged. No worrisome lytic or sclerotic lesions. IMPRESSION: 1. New collapse/consolidation in the right middle lobe. Difficult to exclude a centrally obstructing lesion. 2. Mild septal thickening with bilateral pleural effusions, indicative of congestive heart failure. 3.  Aortic atherosclerosis (ICD10-170.0). Coronary artery calcification. 4. Enlarged pulmonary arteries, indicative of pulmonary arterial hypertension. Electronically Signed   By: Lorin Picket M.D.   On: 11/20/2017 14:17    EKG: Independently reviewed. Paced rhythm.  Assessment/Plan Active Problems:   Pacemaker   CHB (complete heart block) (HCC)   SSS (sick sinus syndrome) (HCC)   Paroxysmal atrial fibrillation (HCC)   COPD (chronic obstructive pulmonary disease) (HCC)   Benign essential HTN   Right middle lobe pneumonia (HCC)   Acute respiratory failure with hypoxia Patient with a history of recurring pneumonia.  She has a history of aspiration in the past patient reports no symptoms of aspiration.. CT scan significant for right middle lobe consolidation/collapse.  Patient with cough and sputum production but is afebrile and white blood cell count is normal.  Cannot rule out pneumonia especially with history.  Patient's exam also concerning for possible COPD component. -Wean oxygen as able -PT eval -Further management below  Right middle lobe pneumonia CT scan also could not rule out obstructing lesion. Patient with a sick contact (daughter recently sick with URI symptoms) -Vancomycin and cefepime -Patient MRSA positive in the past, will repeat MRSA PCR.  If negative, can discontinue vancomycin -Sputum gram/culture -urine strep/legionella  COPD Definitely with wheezing and decreased breath sounds. Possible component of exacerbation. She is on prednisone. Discussed with nursing at [facility] at [facility] and patient is confirmed to be taking daily. -Duoneb q6 hours scheduled -Will see if treatment of pneumonia improves respiratory status before recommending to increase prednisone  Chronic systolic heart failure Patient with an EF of 40-45% from Transthoracic Echocardiogram in 09/2016. Currently euvolemic. Weight has been stable over the past 4 months. -Continue metoprolol, lasix  20 mg daily in addition to potassium -Daily weights and strict in/out -BMP in AM  Facial rash Possibly seborrheic dermatitis. Patient scratches the area frequently. -Ketoconazole cream BID  Polymyalgia rheumatica -Continue prednisone 10 mg daily  Sick sinus syndrome Paroxysmal atrial fibrillation Complete heart block Patient is s/p pacemaker and recent batter change in February of 2019. -Continue metoprolol  Anxiety -continue Xanax prn   DVT prophylaxis: Lovenox Code Status: DNR Family Communication: Daughter at bedside Disposition Plan: Discharge back to ALF in 2-3 days Consults called: None Admission status: Inpatient, medical floor   Cordelia Poche, MD [facility]  If 7PM-7AM, please contact night-coverage [email or web address] Password [facility]  11/20/2017, 3:39 PM

## 2017-11-20 NOTE — ED Notes (Signed)
Bed: FY92 Expected date:  Expected time:  Means of arrival:  Comments: EMS-COPD-

## 2017-11-20 NOTE — ED Notes (Signed)
CRITICAL VALUE STICKER  CRITICAL VALUE: Troponin = 0.04  RECEIVER (on-site recipient of call): S.West,RN  DATE & TIME NOTIFIED: 11/20/17 1045  MESSENGER:  lab representative  MD NOTIFIED: EDMD [place]: 11/20/17 1047  RESPONSE: No new orders

## 2017-11-20 NOTE — ED Triage Notes (Addendum)
Patient BIB EMS from [facility] [place] at [facility] assisted living. Patient has history of dementia. Patient reports generalized pain and cough, with associated clear nasal drainage starting yesterday. Patient has history of COPD and implanted pacemaker. Patient has PRN Lasix at the assisted living for every 2lbs of weight gain, but patient unsure if she has been taking it. Patient reports she didn't feel well yesterday but when she woke up today, she felt SOB with congested cough. Patient oxygen was 84% for first responders and was placed on NRBM. In triage, patient on RA, sats 85-87%. Patient denies nausea/vomiting/fever. Patient received 5mg  albuterol and 0.5mg  atrovent INH by EMS, and 125mg  IV Solumedrol.

## 2017-11-20 NOTE — Progress Notes (Signed)
Pharmacy Antibiotic Note  [name] is a 84 y.o. female admitted on 11/20/2017 with pneumonia.  Pharmacy has been consulted for vancomycin and cefepime dosing. Patient given ceftriaxone and azithromycin in ED. Patient is from assisted living but would not qualify for broad antibiotic coverage for MRSA and pseudomonas.  She appears to be on chronic prednisone (most recent dose 10mg  daily), presume for COPD.  Today, 11/20/2017   SCr WNL  WBC slightly elevated  Afebrile  Currently on RA  CT chest = new collapse/consolidation in RML (? Obstructing lesion)   Plan:  Vancomycin 1gm IV x 1 now then 1gm IV q24h (give 1st dose ~18h after 1st dose)  Cefepime 1gm IV q12h  Monitor renal function closely  Vancomycin levels if continues > 4 days  Anticipate able to de-escalate antibiotics quickly  F/u MRSA PCR  If cultures neg, change to agent with pseudomonal coverage (IV ceftriaxone vs PO agent if appropriate)  Height: 5\' 5"  (165.1 cm) Weight: 147 lb (66.7 kg) IBW/kg (Calculated) : 57  Temp (24hrs), Avg:99.1 F (37.3 C), Min:99.1 F (37.3 C), Max:99.1 F (37.3 C)  Recent Labs  Lab 11/20/17 0944  WBC 10.8*  CREATININE 0.80    Estimated Creatinine Clearance: 47.1 mL/min (by C-G formula based on SCr of 0.8 mg/dL).    No Known Allergies  Antimicrobials this admission: 6/3 ceftriaxone/azithromycin x 1 6/3 vancomycin  6/3 cefepime  Dose adjustments this admission:  Microbiology results: 6/3 BCx:   Sputum: ordered  6/6 MRSA PCR: ordered 6/3 Legionella Ag 6/3 Strep Ag  Thank you for allowing pharmacy to be a part of this patient's care.  Doreene Eland, PharmD, BCPS.   Pager: [phone] 11/20/2017 4:37 PM

## 2017-11-20 NOTE — ED Notes (Signed)
Pt dtr arrived and is at bedside. Per dtr  is on palliative care.

## 2017-11-20 NOTE — ED Provider Notes (Signed)
[facility] DEPT Provider Note   CSN: [id number] Arrival date & time: 11/20/17  0917     History   Chief Complaint Chief Complaint  Patient presents with  . Shortness of Breath   Level v caveat: dementia  HPI [name] is a 84 y.o. female.  HPI 84 yo female with dementia presenting to the ER with productive cough and new hypoxia with 02 sats of 84% for EMS. No vomiting. Received albuterol and atrovent as well as solumedrol by EMS PTA. No improvement in symptoms. Hx of COPD and CHF. Non oxygen dependent. No cp at this time   Past Medical History:  Diagnosis Date  . CHB (complete heart block) ([place]) 07/13/2013   Pacemaker dependent  . CHF (congestive heart failure) ([place])   . COPD (chronic obstructive pulmonary disease) ([place])   . DM (dermatomyositis)   . Orthostatic hypotension   . Pacemaker 07/13/2013   Her original pacemaker and the current leads were implanted in 1992. She has had 2 generator change out, most recently in 2010. Her device is a Buyer, retail 2110 non RF dual-chamber pacemaker with a battery longevity estimated at about 7 years. The atrial lead is a St. Jude [id number] and the ventricular lead was a Biotronik [id number].   [name] Paroxysmal atrial fibrillation ([place]) 09/04/2014   on Eliquis  . Polymyalgia rheumatica ([place])   . Scoliosis   . SSS (sick sinus syndrome) ([place]) 07/13/2013  . Syncope   . Systemic hypertension   . Thoracic kyphosis     Patient Active Problem List   Diagnosis Date Noted  . Elective replacement indicated for cardiac pacemaker battery at end of lifespan 08/09/2017  . Chronic pulmonary aspiration 05/02/2017  . Encounter for palliative care   . Goals of care, counseling/discussion   . Shortness of breath   . Stress fracture of thoracic vertebra 03/19/2017  . Dementia 03/19/2017  . Non-traumatic compression fracture of T9 thoracic vertebra 01/02/2017  . Polymyalgia rheumatica ([place])   . Postural dizziness with  near syncope   . Syncope 10/11/2016  . Postural dizziness with presyncope 10/11/2016  . Acute hypoxemic respiratory failure ([place]) 10/05/2016  . Chronic diastolic CHF (congestive heart failure) ([place]) 10/05/2016  . Smoker 10/05/2016  . Closed multiple fractures of right upper extremity with ribs with routine healing 10/05/2016  . HCAP (healthcare-associated pneumonia) 10/31/2015  . COPD (chronic obstructive pulmonary disease) ([place]) 10/31/2015  . Leukocytosis 10/31/2015  . Benign essential HTN 10/31/2015  . Anxiety state   . COPD exacerbation ([place]) 08/26/2015  . Systemic hypertension 08/26/2015  . Hypokalemia 08/26/2015  . Anticoagulant long-term use 08/26/2015  . DM (dermatomyositis) 08/26/2015  . Acute on chronic diastolic CHF (congestive heart failure), NYHA class 1 ([place])   . Sepsis ([place]) 06/07/2015  . CAP (community acquired pneumonia) 06/07/2015  . Acute CHF (congestive heart failure) ([place]) 06/07/2015  . Paroxysmal atrial fibrillation ([place]) 09/04/2014  . Pacemaker 07/13/2013  . CHB (complete heart block) ([place]) 07/13/2013  . SSS (sick sinus syndrome) ([place]) 07/13/2013  . Orthostatic hypotension 07/13/2013  . Aortic insufficiency 07/13/2013    Past Surgical History:  Procedure Laterality Date  . IR KYPHO THORACIC WITH BONE BIOPSY  01/04/2017  . IR RADIOLOGIST EVAL & MGMT  01/10/2017  . NM MYOCAR PERF WALL MOTION  09/13/2011   Low risk  . PERMANENT PACEMAKER GENERATOR CHANGE  03/27/2009   St.Jude  . PPM GENERATOR CHANGEOUT N/A 08/09/2017   Procedure: PPM GENERATOR CHANGEOUT;  Surgeon: Sanda Klein,  MD;  Location: [facility];  Service: Cardiovascular;  Laterality: N/A;  . US ECHOCARDIOGRAPHY  03/28/2012   Mod LAE,mild MR,aortic sclerosis w/mod AI,mod. TR,mild PI,Stage I diastolic dysfunction  . VIDEO BRONCHOSCOPY Bilateral 10/07/2016   Procedure: VIDEO BRONCHOSCOPY WITH FLUORO;  Surgeon: Marshell Garfinkel, MD;  Location: [facility];  Service: Cardiopulmonary;  Laterality:  Bilateral;     OB History   None      Home Medications    Prior to Admission medications   Medication Sig Start Date End Date Taking? Authorizing Provider  acetaminophen (TYLENOL) 325 MG tablet Take 325 mg by mouth every 6 (six) hours as needed for mild pain, moderate pain or fever.    Yes [provider]  alendronate (FOSAMAX) 70 MG tablet Take 70 mg by mouth every Saturday. Take with a full glass of water on an empty stomach.   Yes [provider]  ALPRAZolam (XANAX) 0.25 MG tablet Take 1 tablet (0.25 mg total) at bedtime as needed by mouth for sleep. 04/25/17  Yes Bonnielee Haff, MD  docusate sodium (COLACE) 100 MG capsule Take 1 capsule (100 mg total) daily as needed by mouth for mild constipation. 04/25/17  Yes Bonnielee Haff, MD  escitalopram (LEXAPRO) 10 MG tablet Take 10 mg by mouth daily.   Yes [provider]  furosemide (LASIX) 40 MG tablet Take 0.5 tablets (20 mg total) daily by mouth. Patient taking differently: Take 40 mg by mouth daily as needed (for weight gain greater than 2lbs).  04/25/17  Yes Bonnielee Haff, MD  HYDROcodone-acetaminophen (NORCO/VICODIN) 5-325 MG tablet Take 1-2 tablets every 4 (four) hours as needed by mouth for moderate pain. Patient taking differently: Take 1 tablet by mouth daily.  04/25/17  Yes Bonnielee Haff, MD  ipratropium-albuterol (DUONEB) 0.5-2.5 (3) MG/3ML SOLN Take 3 mLs by nebulization every 6 (six) hours as needed (for wheezing).   Yes [provider]  metoprolol succinate (TOPROL-XL) 25 MG 24 hr tablet Take 25 mg by mouth daily.    Yes [provider]  midodrine (PROAMATINE) 2.5 MG tablet Take 2.5 mg by mouth as needed (when systolic BP is lower than 100).    Yes [provider]  mirtazapine (REMERON) 15 MG tablet Take 7.5 mg by mouth at bedtime.    Yes [provider]  ondansetron (ZOFRAN) 4 MG tablet Take 4 mg by mouth every 6 (six) hours as needed for nausea or vomiting.    Yes [provider]  potassium chloride SA (K-DUR,KLOR-CON) 20 MEQ tablet Take 1 tablet (20 mEq total) daily by mouth. Patient taking differently: Take 20 mEq by mouth daily as needed (with Lasix).  04/25/17  Yes Bonnielee Haff, MD  predniSONE (DELTASONE) 10 MG tablet Take 6 tablets once daily for 3 days, then take 4 tablets once daily for 3 days, then take 2 tablets once daily for 3 days and then 1 tablet once daily as before. Patient taking differently: 10 mg daily.  04/25/17  Yes Bonnielee Haff, MD  umeclidinium-vilanterol [facility] ELLIPTA) 62.5-25 MCG/INH AEPB Inhale 1 puff into the lungs daily. 08/16/17  Yes Marshell Garfinkel, MD    Family History Family History  Problem Relation Age of Onset  . Stroke Mother     Social History Social History   Tobacco Use  . Smoking status: Former Smoker    Packs/day: 0.50    Years: 60.00    Pack years: 30.00    Types: Cigarettes  . Smokeless tobacco: Never Used  . Tobacco  comment: quit 12/2016  Substance Use Topics  . Alcohol use: No  . Drug use: No     Allergies   Patient has no known allergies.   Review of Systems Review of Systems  Unable to perform ROS: Dementia     Physical Exam Updated Vital Signs BP (!) 148/75 (BP Location: Left Arm)   Pulse 70   Temp 99.1 F (37.3 C) (Oral)   Resp (!) 22   Ht 5\' 5"  (1.651 m)   Wt 66.7 kg (147 lb)   SpO2 98%   BMI 24.46 kg/m   Physical Exam  Constitutional: She appears well-developed and well-nourished. No distress.  HENT:  Head: Normocephalic and atraumatic.  Eyes: EOM are normal.  Neck: Normal range of motion.  Cardiovascular: Normal rate, regular rhythm and normal heart sounds.  Pulmonary/Chest: Effort normal.  Rhonchi bilaterally  Abdominal: Soft. She exhibits no distension. There is no tenderness.  Musculoskeletal: Normal range of motion. She exhibits no edema.  Neurological: She is alert.  Skin: Skin is warm and dry.  Psychiatric: She has a normal mood and  affect. Judgment normal.  Nursing note and vitals reviewed.    ED Treatments / Results  Labs (all labs ordered are listed, but only abnormal results are displayed) Labs Reviewed  BASIC METABOLIC PANEL - Abnormal; Notable for the following components:      Result Value   Glucose, Bld 122 (*)    Calcium 8.2 (*)    All other components within normal limits  CBC - Abnormal; Notable for the following components:   WBC 10.8 (*)    Hemoglobin 10.5 (*)    HCT 35.3 (*)    MCH 25.8 (*)    MCHC 29.7 (*)    RDW 17.6 (*)    All other components within normal limits  BRAIN NATRIURETIC PEPTIDE - Abnormal; Notable for the following components:   B Natriuretic Peptide 706.9 (*)    All other components within normal limits  TROPONIN I - Abnormal; Notable for the following components:   Troponin I 0.04 (*)    All other components within normal limits    EKG None  Radiology Dg Chest 2 View  Result Date: 11/20/2017 CLINICAL DATA:  Cough EXAM: CHEST - 2 VIEW COMPARISON:  Chest radiograph April 21, 2017 and chest CT April 22, 2017 FINDINGS: There is a small pleural effusion on the left. There is atelectatic change in the lung bases. There is no frank consolidation. Heart is upper normal in size with pulmonary vascularity normal. No adenopathy. Pacemaker leads are attached the right atrium and right ventricle. Patient is undergone kyphoplasty procedure in the lower thoracic region. IMPRESSION: Small left pleural effusion with patchy atelectasis in both lower lobes. No frank consolidation. Heart upper normal in size. There is aortic atherosclerosis. Pacemaker leads attached right atrium and right ventricle. Aortic Atherosclerosis (ICD10-I70.0). Electronically Signed   By: Lowella Grip III M.D.   On: 11/20/2017 11:00   Ct Chest Wo Contrast  Result Date: 11/20/2017 CLINICAL DATA:  Worsening shortness of breath.  Cough, COPD. EXAM: CT CHEST WITHOUT CONTRAST TECHNIQUE: Multidetector CT imaging of  the chest was performed following the standard protocol without IV contrast. COMPARISON:  04/22/2017. FINDINGS: Cardiovascular: Atherosclerotic calcification of the arterial vasculature, including three-vessel involvement of the coronary arteries. Pulmonary arteries and heart are enlarged. No pericardial effusion. Mediastinum/Nodes: Mediastinal lymph nodes are not enlarged by CT size criteria. Hilar regions are difficult to evaluate without IV contrast. No axillary adenopathy. Esophagus is  grossly unremarkable. Lungs/Pleura: Mild centrilobular emphysema. Right middle lobe collapse/consolidation. Mild smooth septal thickening with small bilateral pleural effusions. Compressive atelectasis in both lower lobes. There may be post infectious/postinflammatory scarring in the peripheral right lower lobe. Mild peribronchial thickening. Upper Abdomen: Numerous low-attenuation lesions are again seen in the liver, measuring up to 3.4 cm, similar. Visualized portions of the adrenal glands, kidneys, spleen and stomach are grossly unremarkable. No upper abdominal adenopathy. Musculoskeletal: Degenerative changes in the spine. T8 compression fracture and T9 vertebral body augmentation are unchanged. No worrisome lytic or sclerotic lesions. IMPRESSION: 1. New collapse/consolidation in the right middle lobe. Difficult to exclude a centrally obstructing lesion. 2. Mild septal thickening with bilateral pleural effusions, indicative of congestive heart failure. 3. Aortic atherosclerosis (ICD10-170.0). Coronary artery calcification. 4. Enlarged pulmonary arteries, indicative of pulmonary arterial hypertension. Electronically Signed   By: Lorin Picket M.D.   On: 11/20/2017 14:17    Procedures Procedures (including critical care time)  Medications Ordered in ED Medications  cefTRIAXone (ROCEPHIN) 1 g in sodium chloride 0.9 % 100 mL IVPB (has no administration in time range)  azithromycin (ZITHROMAX) 500 mg in sodium chloride  0.9 % 250 mL IVPB (has no administration in time range)  albuterol (PROVENTIL) (2.5 MG/3ML) 0.083% nebulizer solution 5 mg (5 mg Nebulization Given 11/20/17 0959)     Initial Impression / Assessment and Plan / ED Course  I have reviewed the triage vital signs and the nursing notes.  Pertinent labs & imaging results that were available during my care of the patient were reviewed by me and considered in my medical decision making (see chart for details).     Right middle lobe collapse/consolidation in the setting of productive cough and new hypoxia. Will admit for IV abx and nasal cannula oxygen given new hypoxia and oxygen requirement.   Final Clinical Impressions(s) / ED Diagnoses   Final diagnoses:  Community acquired pneumonia of right middle lobe of lung [facility])  Hypoxia    ED Discharge Orders    None       Jola Schmidt, MD 11/20/17 [phone]

## 2017-11-21 DIAGNOSIS — R0902 Hypoxemia: Secondary | ICD-10-CM

## 2017-11-21 DIAGNOSIS — Z950 Presence of cardiac pacemaker: Secondary | ICD-10-CM

## 2017-11-21 LAB — RESPIRATORY PANEL BY PCR
Adenovirus: NOT DETECTED
Bordetella pertussis: NOT DETECTED
CORONAVIRUS 229E-RVPPCR: NOT DETECTED
CORONAVIRUS HKU1-RVPPCR: NOT DETECTED
CORONAVIRUS OC43-RVPPCR: NOT DETECTED
Chlamydophila pneumoniae: NOT DETECTED
Coronavirus NL63: NOT DETECTED
Influenza A: NOT DETECTED
Influenza B: NOT DETECTED
METAPNEUMOVIRUS-RVPPCR: NOT DETECTED
Mycoplasma pneumoniae: NOT DETECTED
PARAINFLUENZA VIRUS 1-RVPPCR: NOT DETECTED
PARAINFLUENZA VIRUS 2-RVPPCR: NOT DETECTED
PARAINFLUENZA VIRUS 3-RVPPCR: NOT DETECTED
Parainfluenza Virus 4: NOT DETECTED
Respiratory Syncytial Virus: NOT DETECTED
Rhinovirus / Enterovirus: DETECTED — AB

## 2017-11-21 LAB — PROCALCITONIN: Procalcitonin: <0.1 ng/mL

## 2017-11-21 LAB — HIV ANTIBODY (ROUTINE TESTING W REFLEX): HIV SCREEN 4TH GENERATION: NONREACTIVE

## 2017-11-21 LAB — TROPONIN I: TROPONIN I: 0.03 ng/mL — AB (ref <0.03)

## 2017-11-21 MED ORDER — PIPERACILLIN-TAZOBACTAM 3.375 G IVPB
3.3750 g | Freq: Three times a day (TID) | INTRAVENOUS | Status: DC
  Administered 2017-11-21 – 2017-11-24 (×8): 3.375 g via INTRAVENOUS
  Filled 2017-11-21 (×10): qty 50

## 2017-11-21 MED ORDER — GUAIFENESIN ER 600 MG PO TB12
1200.0000 mg | ORAL_TABLET | Freq: Two times a day (BID) | ORAL | Status: DC
  Administered 2017-11-21 – 2017-11-24 (×7): 1200 mg via ORAL
  Filled 2017-11-21 (×9): qty 2

## 2017-11-21 MED ORDER — IPRATROPIUM-ALBUTEROL 0.5-2.5 (3) MG/3ML IN SOLN
3.0000 mL | Freq: Four times a day (QID) | RESPIRATORY_TRACT | Status: DC
  Administered 2017-11-21 – 2017-11-23 (×7): 3 mL via RESPIRATORY_TRACT
  Filled 2017-11-21 (×7): qty 3

## 2017-11-21 MED ORDER — GUAIFENESIN-DM 100-10 MG/5ML PO SYRP: 10.0000 mL | ORAL_SOLUTION | ORAL | Status: DC | PRN

## 2017-11-21 MED ORDER — SODIUM CHLORIDE 3 % IN NEBU
4.0000 mL | INHALATION_SOLUTION | Freq: Three times a day (TID) | RESPIRATORY_TRACT | Status: DC
  Administered 2017-11-21 (×2): 4 mL via RESPIRATORY_TRACT
  Filled 2017-11-21 (×3): qty 4

## 2017-11-21 MED ORDER — IPRATROPIUM-ALBUTEROL 0.5-2.5 (3) MG/3ML IN SOLN
3.0000 mL | Freq: Three times a day (TID) | RESPIRATORY_TRACT | Status: DC
  Administered 2017-11-21: 3 mL via RESPIRATORY_TRACT
  Filled 2017-11-21: qty 3

## 2017-11-21 NOTE — Evaluation (Signed)
Physical Therapy Evaluation Patient Details Name: [name] MRN: [id number] DOB: 02/03/1931 Today's Date: 11/21/2017   History of Present Illness  84 y.o. female with medical history significant of sick sinus syndrome and complete heart block status post pacemaker, paroxysmal atrial fibrillation, COPD, hypertension, recurrent pneumonia, dementia, anxiety, kyphoplasty, and admitted for acute respiratory failure with hypoxia and found to have right middle lobe pneumonia  Clinical Impression  Pt admitted with above diagnosis. Pt currently with functional limitations due to the deficits listed below (see PT Problem List).  Pt will benefit from skilled PT to increase their independence and safety with mobility to allow discharge to the venue listed below.  Pt from ALF and typically modified independent with RW, only has supervision for shower transfers.  Pt able to ambulate within room with RW however oxygen saturations dropped.  Recommend HHPT upon return to ALF.  SATURATION QUALIFICATIONS: (This note is used to comply with regulatory documentation for home oxygen)  Patient Saturations on Room Air at Rest = 94%  Patient Saturations on Room Air while Ambulating = 78%  Patient Saturations on 2 Liters of oxygen while Ambulating = 94%  Please briefly explain why patient needs home oxygen: to improve oxygen saturations with ambulation and physical activity     Follow Up Recommendations Home health PT    Equipment Recommendations  None recommended by PT    Recommendations for Other Services       Precautions / Restrictions Precautions Precautions: Fall Precaution Comments: monitor sats      Mobility  Bed Mobility Overal bed mobility: Needs Assistance Bed Mobility: Supine to Sit     Supine to sit: Min assist     General bed mobility comments: provided a little assist for trunk upright  Transfers Overall transfer level: Needs assistance Equipment used: Rolling walker (2  wheeled) Transfers: Sit to/from Stand Sit to Stand: Min guard         General transfer comment: verbal cues for safe technique  Ambulation/Gait Ambulation/Gait assistance: Min guard Ambulation Distance (Feet): 40 Feet Assistive device: Rolling walker (2 wheeled) Gait Pattern/deviations: Step-through pattern;Decreased stride length     General Gait Details: steady with RW, only wished to ambulate in room today, dyspnea 2/4, SPO2 dropped to 78% on room air with ambulation  Stairs            Wheelchair Mobility    Modified Rankin (Stroke Patients Only)       Balance Overall balance assessment: Needs assistance(denies any recent falls)         Standing balance support: Bilateral upper extremity supported Standing balance-Leahy Scale: Poor                               Pertinent Vitals/Pain Pain Assessment: No/denies pain    Home Living Family/patient expects to be discharged to:: Assisted living               Home Equipment: Walker - 2 wheels;Cane - single point;Bedside commode      Prior Function Level of Independence: Independent with assistive device(s)         Comments: uses RW for ambulation, has supervision for showers however performs her own ADLs     Hand Dominance        Extremity/Trunk Assessment        Lower Extremity Assessment Lower Extremity Assessment: Generalized weakness       Communication   Communication: No difficulties  Cognition Arousal/Alertness: Awake/alert Behavior During Therapy: WFL for tasks assessed/performed Overall Cognitive Status: History of cognitive impairments - at baseline                                        General Comments      Exercises     Assessment/Plan    PT Assessment Patient needs continued PT services  PT Problem List Decreased strength;Decreased mobility;Decreased activity tolerance;Decreased balance;Cardiopulmonary status limiting activity        PT Treatment Interventions Therapeutic exercise;Patient/family education;Gait training;DME instruction;Therapeutic activities;Functional mobility training;Balance training    PT Goals (Current goals can be found in the Care Plan section)  Acute Rehab PT Goals PT Goal Formulation: With patient Time For Goal Achievement: 12/05/17 Potential to Achieve Goals: Good    Frequency Min 3X/week   Barriers to discharge        Co-evaluation               AM-PAC PT "6 Clicks" Daily Activity  Outcome Measure Difficulty turning over in bed (including adjusting bedclothes, sheets and blankets)?: A Little Difficulty moving from lying on back to sitting on the side of the bed? : Unable Difficulty sitting down on and standing up from a chair with arms (e.g., wheelchair, bedside commode, etc,.)?: Unable Help needed moving to and from a bed to chair (including a wheelchair)?: A Little Help needed walking in hospital room?: A Little Help needed climbing 3-5 steps with a railing? : A Lot 6 Click Score: 13    End of Session Equipment Utilized During Treatment: Gait belt;Oxygen Activity Tolerance: Patient limited by fatigue Patient left: in chair;with chair alarm set;with call bell/phone within reach;with family/visitor present Nurse Communication: Mobility status PT Visit Diagnosis: Other abnormalities of gait and mobility (R26.89)    Time: 6644-0347 PT Time Calculation (min) (ACUTE ONLY): 17 min   Charges:   PT Evaluation $PT Eval Low Complexity: 1 Low     PT G Codes:        Carmelia Bake, PT, DPT 11/21/2017 Pager: [phone]  York Ram E 11/21/2017, 12:52 PM

## 2017-11-21 NOTE — Consult Note (Signed)
   [facility] CM Inpatient Consult   11/21/2017  [name] 1930/09/07 [id number]    Patient screened for potential [facility] Care Management services due to unplanned readmission risk score of 25% (high).  Chart reviewed. Noted patient is from ALF. Also noted palliative care consult is pending.   Will continue to follow for potential [facility] Care Management needs.    Marthenia Rolling, MSN-Ed, RN,BSN [facility] Liaison [phone]

## 2017-11-21 NOTE — Progress Notes (Addendum)
PROGRESS NOTE  [name] [name] DOB: 02-07-1931 DOA: 11/20/2017 PCP: Lajean Manes, MD  HPI/Recap of past 24 hours: [name] is a 84 y.o. female with medical history significant of sick sinus syndrome and complete heart block status post pacemaker, paroxysmal atrial fibrillation, COPD, hypertension, recurrent pneumonia, dementia, anxiety.  Unable to  obtain complete history secondary to patient's underlying dementia.  Patient states that she woke up today feeling very short of breath.  She states that she has been having increased shortness of breath over the last 3 days with coughing and sputum production that is clear.  She does not report any wheezing.  Admitted for HCAP.  11/21/2017: Patient seen and examined at her bedside with her daughter present.  She reports productive cough.  Pulmonary toilet started.  Denies chest pain, dyspnea at rest, or palpitations.  Assessment/Plan: Active Problems:   Pacemaker   CHB (complete heart block) (HCC)   SSS (sick sinus syndrome) (HCC)   Paroxysmal atrial fibrillation (HCC)   COPD (chronic obstructive pulmonary disease) (HCC)   Benign essential HTN   Right middle lobe pneumonia (HCC)   HCAP, improving Personally reviewed chest x-ray done on 11/20/2017 which revealed right middle lobe infiltrates. MRSA negative-DC vancomycin Continue Zosyn for HCAP Procalcitonin less than 0.10 Afebrile with no leukocytosis Monitor fever curve Repeat CBC in the morning  Intractable cough secondary to H CAP Start pulmonary toilet: Hypersaline nebs 3 times daily chest physiotherapy 3 times daily Start duo nebs every 6 hours Start Mucinex 1200 mg twice daily Start Robitussin syrup as needed every 4 hours  Paroxysmal A. fib not on anticoagulation Rate controlled Continue Toprol-XL  Chronic depression/anxiety Continue Lexapro, mirtazapine, as needed Xanax nightly  Chronic systolic CHF with LVEF 45 to 50% Last 2D echo done on 10/11/2016  revealed LVEF 45 to 50% with hypokinesis of the apical myocardium and a trivial pericardial effusion identified.  Chronic normocytic anemia Obtain iron studies Hemoglobin 10.5 Based on hemoglobin 11 No sign of overt bleeding Repeat CBC in the morning  Abnormal TSH in 2018 TSH 0.032 (10/11/16) No reported hx  Repeat TSH    Code Status: DNR  Family Communication: Daughter at bedside  Disposition Plan: SNF when clinically stable   Consultants:  None  Procedures:  None  Antimicrobials:  IV Zosyn  DVT prophylaxis: Subcu Lovenox   Objective: Vitals:   11/20/17 2342 11/21/17 0500 11/21/17 0533 11/21/17 0906  BP:   (!) 171/78   Pulse:   70   Resp:   17   Temp:   98.5 F (36.9 C)   TempSrc:   Oral   SpO2: 97%  98% 96%  Weight:  68.3 kg (150 lb 9.2 oz)    Height:        Intake/Output Summary (Last 24 hours) at 11/21/2017 1302 Last data filed at 11/20/2017 2358 Gross per 24 hour  Intake 100 ml  Output 400 ml  Net -300 ml   Filed Weights   11/20/17 0942 11/21/17 0500  Weight: 66.7 kg (147 lb) 68.3 kg (150 lb 9.2 oz)    Exam:  . General: 84 y.o. year-old female well developed well nourished in no acute distress.   . Cardiovascular: Regular rate and rhythm with no rubs or gallops.  No thyromegaly or JVD noted.   [name] Respiratory: Diffuse rales noted bilaterally and mild expiratory wheezes posteriorly.  Good inspiratory effort. . Abdomen: Soft nontender nondistended with normal bowel sounds x4 quadrants. . Musculoskeletal: No lower extremity edema.  2/4 pulses in all 4 extremities. . Skin: No ulcerative lesions noted or rashes, . Psychiatry: Mood is appropriate for condition and setting   Data Reviewed: CBC: Recent Labs  Lab 11/20/17 0944  WBC 10.8*  HGB 10.5*  HCT 35.3*  MCV 86.7  PLT 025   Basic Metabolic Panel: Recent Labs  Lab 11/20/17 0944  NA 140  K 3.6  CL 104  CO2 26  GLUCOSE 122*  BUN 17  CREATININE 0.80  CALCIUM 8.2*    GFR: Estimated Creatinine Clearance: 47.1 mL/min (by C-G formula based on SCr of 0.8 mg/dL). Liver Function Tests: No results for input(s): AST, ALT, ALKPHOS, BILITOT, PROT, ALBUMIN in the last 168 hours. No results for input(s): LIPASE, AMYLASE in the last 168 hours. No results for input(s): AMMONIA in the last 168 hours. Coagulation Profile: No results for input(s): INR, PROTIME in the last 168 hours. Cardiac Enzymes: Recent Labs  Lab 11/20/17 0944 11/21/17 0505  TROPONINI 0.04* 0.03*   BNP (last 3 results) No results for input(s): PROBNP in the last 8760 hours. HbA1C: No results for input(s): HGBA1C in the last 72 hours. CBG: No results for input(s): GLUCAP in the last 168 hours. Lipid Profile: No results for input(s): CHOL, HDL, LDLCALC, TRIG, CHOLHDL, LDLDIRECT in the last 72 hours. Thyroid Function Tests: No results for input(s): TSH, T4TOTAL, FREET4, T3FREE, THYROIDAB in the last 72 hours. Anemia Panel: No results for input(s): VITAMINB12, FOLATE, FERRITIN, TIBC, IRON, RETICCTPCT in the last 72 hours. Urine analysis:    Component Value Date/Time   COLORURINE YELLOW 10/11/2016 0033   APPEARANCEUR CLEAR 10/11/2016 0033   LABSPEC 1.016 10/11/2016 0033   PHURINE 6.0 10/11/2016 0033   GLUCOSEU NEGATIVE 10/11/2016 0033   HGBUR SMALL (A) 10/11/2016 0033   BILIRUBINUR NEGATIVE 10/11/2016 0033   KETONESUR NEGATIVE 10/11/2016 0033   PROTEINUR NEGATIVE 10/11/2016 0033   UROBILINOGEN 1.0 01/19/2011 1030   NITRITE NEGATIVE 10/11/2016 0033   LEUKOCYTESUR NEGATIVE 10/11/2016 0033   Sepsis Labs: @LABRCNTIP (procalcitonin:4,lacticidven:4)  ) Recent Results (from the past 240 hour(s))  MRSA PCR Screening     Status: None   Collection Time: 11/20/17  5:08 PM  Result Value Ref Range Status   MRSA by PCR NEGATIVE NEGATIVE Final    Comment:        The GeneXpert MRSA Assay (FDA approved for NASAL specimens only), is one component of a comprehensive MRSA  colonization surveillance program. It is not intended to diagnose MRSA infection nor to guide or monitor treatment for MRSA infections. Performed at [facility], [place] [address], [place], [place] [address]   Respiratory Panel by PCR     Status: Abnormal   Collection Time: 11/20/17  5:26 PM  Result Value Ref Range Status   Adenovirus NOT DETECTED NOT DETECTED Final   Coronavirus 229E NOT DETECTED NOT DETECTED Final   Coronavirus HKU1 NOT DETECTED NOT DETECTED Final   Coronavirus NL63 NOT DETECTED NOT DETECTED Final   Coronavirus OC43 NOT DETECTED NOT DETECTED Final   Metapneumovirus NOT DETECTED NOT DETECTED Final   Rhinovirus / Enterovirus DETECTED (A) NOT DETECTED Final   Influenza A NOT DETECTED NOT DETECTED Final   Influenza B NOT DETECTED NOT DETECTED Final   Parainfluenza Virus 1 NOT DETECTED NOT DETECTED Final   Parainfluenza Virus 2 NOT DETECTED NOT DETECTED Final   Parainfluenza Virus 3 NOT DETECTED NOT DETECTED Final   Parainfluenza Virus 4 NOT DETECTED NOT DETECTED Final   Respiratory Syncytial Virus NOT DETECTED NOT  DETECTED Final   Bordetella pertussis NOT DETECTED NOT DETECTED Final   Chlamydophila pneumoniae NOT DETECTED NOT DETECTED Final   Mycoplasma pneumoniae NOT DETECTED NOT DETECTED Final    Comment: Performed at [facility], [place] [address] [place], [place] [address]      Studies: Ct Chest Wo Contrast  Result Date: 11/20/2017 CLINICAL DATA:  Worsening shortness of breath.  Cough, COPD. EXAM: CT CHEST WITHOUT CONTRAST TECHNIQUE: Multidetector CT imaging of the chest was performed following the standard protocol without IV contrast. COMPARISON:  04/22/2017. FINDINGS: Cardiovascular: Atherosclerotic calcification of the arterial vasculature, including three-vessel involvement of the coronary arteries. Pulmonary arteries and heart are enlarged. No pericardial effusion. Mediastinum/Nodes: Mediastinal lymph nodes are not enlarged by CT size  criteria. Hilar regions are difficult to evaluate without IV contrast. No axillary adenopathy. Esophagus is grossly unremarkable. Lungs/Pleura: Mild centrilobular emphysema. Right middle lobe collapse/consolidation. Mild smooth septal thickening with small bilateral pleural effusions. Compressive atelectasis in both lower lobes. There may be post infectious/postinflammatory scarring in the peripheral right lower lobe. Mild peribronchial thickening. Upper Abdomen: Numerous low-attenuation lesions are again seen in the liver, measuring up to 3.4 cm, similar. Visualized portions of the adrenal glands, kidneys, spleen and stomach are grossly unremarkable. No upper abdominal adenopathy. Musculoskeletal: Degenerative changes in the spine. T8 compression fracture and T9 vertebral body augmentation are unchanged. No worrisome lytic or sclerotic lesions. IMPRESSION: 1. New collapse/consolidation in the right middle lobe. Difficult to exclude a centrally obstructing lesion. 2. Mild septal thickening with bilateral pleural effusions, indicative of congestive heart failure. 3. Aortic atherosclerosis (ICD10-170.0). Coronary artery calcification. 4. Enlarged pulmonary arteries, indicative of pulmonary arterial hypertension. Electronically Signed   By: Lorin Picket M.D.   On: 11/20/2017 14:17    Scheduled Meds: . enoxaparin (LOVENOX) injection  40 mg Subcutaneous Q24H  . escitalopram  10 mg Oral Daily  . furosemide  20 mg Oral Daily  . guaiFENesin  1,200 mg Oral BID  . ipratropium-albuterol  3 mL Nebulization Q6H  . ketoconazole   Topical BID  . metoprolol succinate  25 mg Oral Daily  . mirtazapine  7.5 mg Oral QHS  . potassium chloride SA  20 mEq Oral Daily  . sodium chloride HYPERTONIC  4 mL Nebulization TID  . umeclidinium-vilanterol  1 puff Inhalation Daily    Continuous Infusions: . piperacillin-tazobactam (ZOSYN)  IV       LOS: 1 day     Kayleen Memos, MD [facility] Pager  [phone]  If 7PM-7AM, please contact night-coverage [email or web address] Password [email or web address] 11/21/2017, 1:02 PM

## 2017-11-21 NOTE — Progress Notes (Signed)
PT demonstrated hands on understanding of Flutter device- will complete TID with RT.

## 2017-11-21 NOTE — Progress Notes (Addendum)
Pharmacy Antibiotic Note  [name] is a 84 y.o. female admitted on 11/20/2017 with pneumonia.  Pharmacy has been consulted for vancomycin and cefepime dosing, now transitioning to zosyn. Patient given ceftriaxone and azithromycin in ED. Patient is from assisted living but would not qualify for broad antibiotic coverage for MRSA and pseudomonas.  She appears to be on chronic prednisone (most recent dose 10mg  daily), presume for COPD. WBC slightly elevated at 10.3 and remains afebrile.    Plan: Discontinue vancomycin and cefepime  Start zosyn 3.375g IV q8h EI dosing   Height: 5\' 5"  (165.1 cm) Weight: 150 lb 9.2 oz (68.3 kg) IBW/kg (Calculated) : 57  Temp (24hrs), Avg:98.8 F (37.1 C), Min:98.5 F (36.9 C), Max:98.9 F (37.2 C)  Recent Labs  Lab 11/20/17 0944  WBC 10.8*  CREATININE 0.80    Estimated Creatinine Clearance: 47.1 mL/min (by C-G formula based on SCr of 0.8 mg/dL).    No Known Allergies  Antimicrobials this admission: 6/3 ceftriaxone/azithromycin x 1 6/3 vancomycin>>6/4 6/3 cefepime>>6/4 6/4 zosyn   Dose adjustments this admission:  Microbiology results: 6/3 BCx: in process   Sputum: ordered  6/6 MRSA PCR: negative  6/3 Legionella Ag: active   6/3 Strep Ag: active    Jalene Mullet, Pharm.D. PGY1 Pharmacy Resident 11/21/2017 9:59 AM

## 2017-11-21 NOTE — Progress Notes (Signed)
PMT note, no charge.   In to see patient who is sitting in bedside chair, alert, and watching t.v. No distress noted. Daughter [name] is by her bedside, she requested we step outside the room. Daughter stated that she asked to speak with palliative team today about their previous palliative meeting/hospitalization and this current admission.   The patient's husband died at [facility] 5 years ago. [name] is the patient's only living child and her HCPOA agent. The patient had a son who died 36 years ago on duty as a Engineer, structural.   She states her mother has declined over the last 3-4 months. She states she sleeps more, and stays in bed more than she has been. She will not use recommended oxygen at facility. She does not want to leave her facility. She uses a walker. She feeds herself. Her appetite has been good, and she has gained weight.   She states the only true pleasure she finds is going to play the slot machines in [place]. [name] states her mother will ask how she is doing personally but does not as about grandchildren. She states the only reason her mother has stopped coming to the hospital as much is because she has stopped going to [facility]. Last trip, she spend 2 days there, with 12 hours on the casino floor at a time. She was so tired at the end of the trip her family had to pack for her and take her out in a wheelchair. [name] states she noticed after every trip to [place], she came to the hospital soon after.   We discussed her diagnoses. She states her mother has been asking frequently to leave the hospital. We discussed palliative vs hospice. She would like a [place] conversation in the morning with her mother. Will revisit at 10:00 in the morning.

## 2017-11-22 DIAGNOSIS — Z515 Encounter for palliative care: Secondary | ICD-10-CM

## 2017-11-22 DIAGNOSIS — Z7189 Other specified counseling: Secondary | ICD-10-CM

## 2017-11-22 LAB — VITAMIN B12: Vitamin B-12: 240 pg/mL (ref 180–914)

## 2017-11-22 LAB — BASIC METABOLIC PANEL
ANION GAP: 7 (ref 5–15)
BUN: 19 mg/dL (ref 6–20)
CALCIUM: 8.4 mg/dL — AB (ref 8.9–10.3)
CO2: 31 mmol/L (ref 22–32)
CREATININE: 0.89 mg/dL (ref 0.44–1.00)
Chloride: 103 mmol/L (ref 101–111)
GFR, EST NON AFRICAN AMERICAN: 58 mL/min — AB (ref >60)
Glucose, Bld: 98 mg/dL (ref 65–99)
Potassium: 4.5 mmol/L (ref 3.5–5.1)
Sodium: 141 mmol/L (ref 135–145)

## 2017-11-22 LAB — CBC
HCT: 35.5 % — ABNORMAL LOW (ref 36.0–46.0)
Hemoglobin: 10.4 g/dL — ABNORMAL LOW (ref 12.0–15.0)
MCH: 26 pg (ref 26.0–34.0)
MCHC: 29.3 g/dL — ABNORMAL LOW (ref 30.0–36.0)
MCV: 88.8 fL (ref 78.0–100.0)
PLATELETS: 251 10*3/uL (ref 150–400)
RBC: 4 MIL/uL (ref 3.87–5.11)
RDW: 18 % — ABNORMAL HIGH (ref 11.5–15.5)
WBC: 13.3 10*3/uL — AB (ref 4.0–10.5)

## 2017-11-22 LAB — IRON AND TIBC
Iron: 31 ug/dL (ref 28–170)
SATURATION RATIOS: 7 % — AB (ref 10.4–31.8)
TIBC: 434 ug/dL (ref 250–450)
UIBC: 403 ug/dL

## 2017-11-22 LAB — MAGNESIUM: MAGNESIUM: 1.9 mg/dL (ref 1.7–2.4)

## 2017-11-22 LAB — RETICULOCYTES
RBC.: 4 MIL/uL (ref 3.87–5.11)
RETIC COUNT ABSOLUTE: 60 10*3/uL (ref 19.0–186.0)
RETIC CT PCT: 1.5 % (ref 0.4–3.1)

## 2017-11-22 LAB — FERRITIN: Ferritin: 32 ng/mL (ref 11–307)

## 2017-11-22 LAB — T4, FREE: Free T4: 1.09 ng/dL (ref 0.82–1.77)

## 2017-11-22 LAB — TSH: TSH: 0.028 u[IU]/mL — ABNORMAL LOW (ref 0.350–4.500)

## 2017-11-22 LAB — SAVE SMEAR

## 2017-11-22 MED ORDER — UMECLIDINIUM-VILANTEROL 62.5-25 MCG/INH IN AEPB
1.0000 | INHALATION_SPRAY | Freq: Every day | RESPIRATORY_TRACT | Status: DC
  Administered 2017-11-22 – 2017-11-24 (×3): 1 via RESPIRATORY_TRACT
  Filled 2017-11-22: qty 14

## 2017-11-22 MED ORDER — FERUMOXYTOL INJECTION 510 MG/17 ML
510.0000 mg | Freq: Once | INTRAVENOUS | Status: AC
  Administered 2017-11-22: 510 mg via INTRAVENOUS
  Filled 2017-11-22: qty 17

## 2017-11-22 MED ORDER — SODIUM CHLORIDE 3 % IN NEBU
4.0000 mL | INHALATION_SOLUTION | Freq: Two times a day (BID) | RESPIRATORY_TRACT | Status: DC
  Administered 2017-11-22 – 2017-11-24 (×4): 4 mL via RESPIRATORY_TRACT
  Filled 2017-11-22 (×6): qty 4

## 2017-11-22 NOTE — Clinical Social Work Note (Signed)
Clinical Social Work Assessment  Patient Details  Name: [name] MRN: [id number] Date of Birth: 07-25-1930  Date of referral:  11/22/17               Reason for consult:  Facility Placement                Permission sought to share information with:  Family Supports, Customer service manager, Case Optician, dispensing granted to share information::  Yes, Verbal Permission Granted  Name::     Medical sales representative::  [facility]  Relationship::  Daughter  Contact Information:     Housing/Transportation Living arrangements for the past 2 months:  [place] of Information:  Adult Children, Facility Patient Interpreter Needed:  None Criminal Activity/Legal Involvement Pertinent to Current Situation/Hospitalization:  No - Comment as needed Significant Relationships:  Adult Children, Warehouse manager, Friend Lives with:  Facility Resident Do you feel safe going back to the place where you live?  Yes Need for family participation in patient care:  Yes (Comment)  Care giving concerns:  No care giving concerns at the time of assessment.   Social Worker assessment / plan:  LCSW following for facility placement.  Patient is a resident of [facility].   PLAN: Patient will return to ALF at dc. Patient's [name] will transport.   PT recommending home health services at dc.  According to facility patient is independent in ADLs. At times patient needs assistance getting in and out of the tub. [name] reports that patient uses a walker around the property, but uses a cane when she goes out with friends and family.   Employment status:  Retired Forensic scientist:  Medicare PT Recommendations:  Home with [place] / Referral to community resources:     Patient/Family's Response to care:  Family and facility are thankful for LCSW coordination with dc planning.   Patient/Family's Understanding of and Emotional Response to  Diagnosis, Current Treatment, and Prognosis:  Patient and family are realistic about patients current condition. Patient and family are agreeable to home health and return to ALF at dc. Palliative met with daughter. Palliative involvement unclear at the time of assessment.   Emotional Assessment Appearance:  Appears stated age Attitude/Demeanor/Rapport:  Unable to Assess Affect (typically observed):  Unable to Assess Orientation:  Oriented to Self, Oriented to Place, Oriented to  Time, Oriented to Situation Alcohol / Substance use:  Not Applicable Psych involvement (Current and /or in the community):  No (Comment)  Discharge Needs  Concerns to be addressed:  No discharge needs identified Readmission within the last 30 days:  No Current discharge risk:  None Barriers to Discharge:  Continued Medical Work up   [employer], LCSW 11/22/2017, 9:49 AM

## 2017-11-22 NOTE — Consult Note (Addendum)
Consultation Note Date: 11/22/2017   Patient Name: [name]  DOB: [birth year]  MRN: [id number]  Age / Sex: 84 y.o., female  PCP: Lajean Manes, MD Referring Physician: Dessa Phi, DO  Reason for Consultation: Establishing goals of care  HPI/Patient Profile:[name] is a 84 y.o. female with medical history significant of sick sinus syndrome and complete heart block status post pacemaker, paroxysmal atrial fibrillation, COPD, hypertension, recurrent pneumonia, dementia, anxiety.   Clinical Assessment and Goals of Care: [name] is currently admitted for PNA. She has been widowed for 5 years. Her son died on duty as a Engineer, structural 41 years ago. [name] is her daughter and POA who is at bedside.   She states she enjoys going to [place] to play the slot machines, but has not felt like doing that lately, and plays BINGO at the facility. Upon going to play the slot machines, she would come home worn out and often require admissions to the hospital following those weekends. She uses a walker. She tells me she forgets things her daughter tells her.  [name] states she sleeps late because there is not a point in getting up early, and enjoys resting in bed watching t.v. Her appetite has been good, and she has gained weight.   We discussed her diagnoses, prognosis, and GOC.  A detailed discussion was had today regarding advanced directives.  Concepts specific to code status, artifical feeding and hydration, continued IV antibiotics and rehospitalization was had.  The difference between an aggressive medical intervention path and a comfort care path was discussed.  Values, quality of life, and goals of care important to patient and family were attempted to be elicited.  Natural trajectory and expectations at EOL were discussed. Her daughter voices concern she will not wear oxygen once leaving the  hospital. [name] states she does not want to wear oxygen, but she will if she has to if it meant death. She states she is not "ready to go" yet. She inquired if this writer is with hospice, and was advised that this Probation officer is with palliative medicine restating the difference between the two. She stated "Hospice is good and what they do is good, but this is me we're talking about". Her [name] explained to her that it is important that she share her wishes as her father, [name] husband was much more open about his wishes.  She confirms DNR/DNI wishes, and would like to continue to treat the treatable, and come to the hospital as needed. Her daughter states she has a living will stating she would not want a feeding tube, but [name] states she is not sure.  During our meeting she did get out of bed, take 1 step and turn to sit on  bedside toilet with O2 in place. Upon getting back in bed, her O2 sat dropped to 88% on 2 lpm of O2.    Recommend continued conversations with palliative about goals of care at facility. Concern for decline and need for  hospice depending on how she progresses.    SUMMARY OF RECOMMENDATIONS    Patient is currently followed by palliative at her facility. Recommend continued conversations.    During our meeting she did get out of bed, take 1 step and turn to sit on  bedside toilet with O2 in place. Upon getting back in bed, her O2 sat dropped to 88% on 2 lpm of O2.    Code Status/Advance Care Planning:  DNR    Symptom Management:   Agree with neb tx, guaifenesin,and respiratory therapy.   Agree with Vicodin for back pain.  Palliative Prophylaxis:   Eye Care and Oral Care  Prognosis:   Poor.   Discharge Planning: Return to facility with palliative to continue.      Primary Diagnoses: Present on Admission: . Right middle lobe pneumonia ([place]) . Pacemaker . CHB (complete heart block) ([place]) . SSS (sick sinus syndrome) ([place]) .  Paroxysmal atrial fibrillation (HCC) . COPD (chronic obstructive pulmonary disease) ([place]) . Benign essential HTN   I have reviewed the medical record, interviewed the patient and family, and examined the patient. The following aspects are pertinent.  Past Medical History:  Diagnosis Date  . CHB (complete heart block) ([place]) 07/13/2013   Pacemaker dependent  . CHF (congestive heart failure) ([place])   . COPD (chronic obstructive pulmonary disease) ([place])   . DM (dermatomyositis)   . Orthostatic hypotension   . Pacemaker 07/13/2013   Her original pacemaker and the current leads were implanted in 1992. She has had 2 generator change out, most recently in 2010. Her device is a Buyer, retail 2110 non RF dual-chamber pacemaker with a battery longevity estimated at about 7 years. The atrial lead is a St. Jude 1245 and the ventricular lead was a Biotronik [id number].   [name] Paroxysmal atrial fibrillation ([place]) 09/04/2014   on Eliquis  . Polymyalgia rheumatica ([place])   . Scoliosis   . SSS (sick sinus syndrome) ([place]) 07/13/2013  . Syncope   . Systemic hypertension   . Thoracic kyphosis    Social History   Socioeconomic History  . Marital status: Married    Spouse name: Not on file  . Number of children: Not on file  . Years of education: Not on file  . Highest education level: Not on file  Occupational History  . Occupation: retired  Scientific laboratory technician  . Financial resource strain: Not on file  . Food insecurity:    Worry: Not on file    Inability: Not on file  . Transportation needs:    Medical: Not on file    Non-medical: Not on file  Tobacco Use  . Smoking status: Former Smoker    Packs/day: 0.50    Years: 60.00    Pack years: 30.00    Types: Cigarettes  . Smokeless tobacco: Never Used  . Tobacco comment: quit 12/2016  Substance and Sexual Activity  . Alcohol use: No  . Drug use: No  . Sexual activity: Not Currently    Partners: Female  Lifestyle  . Physical activity:    Days per week:  Not on file    Minutes per session: Not on file  . Stress: Not on file  Relationships  . Social connections:    Talks on phone: Not on file    Gets together: Not on file    Attends religious service: Not on file    Active member of club or organization: Not on file    Attends meetings of clubs  or organizations: Not on file    Relationship status: Not on file  Other Topics Concern  . Not on file  Social History Narrative  . Not on file   Family History  Problem Relation Age of Onset  . Stroke Mother    Scheduled Meds: . enoxaparin (LOVENOX) injection  40 mg Subcutaneous Q24H  . escitalopram  10 mg Oral Daily  . furosemide  20 mg Oral Daily  . guaiFENesin  1,200 mg Oral BID  . ipratropium-albuterol  3 mL Nebulization Q6H  . ketoconazole   Topical BID  . metoprolol succinate  25 mg Oral Daily  . mirtazapine  7.5 mg Oral QHS  . potassium chloride SA  20 mEq Oral Daily  . sodium chloride HYPERTONIC  4 mL Nebulization BID  . umeclidinium-vilanterol  1 puff Inhalation Daily   Continuous Infusions: . piperacillin-tazobactam (ZOSYN)  IV Stopped (11/22/17 6962)   PRN Meds:.ALPRAZolam, docusate sodium, guaiFENesin-dextromethorphan, HYDROcodone-acetaminophen, ondansetron Medications Prior to Admission:  Prior to Admission medications   Medication Sig Start Date End Date Taking? Authorizing Provider  acetaminophen (TYLENOL) 325 MG tablet Take 325 mg by mouth every 6 (six) hours as needed for mild pain, moderate pain or fever.    Yes [provider]  alendronate (FOSAMAX) 70 MG tablet Take 70 mg by mouth every Saturday. Take with a full glass of water on an empty stomach.   Yes [provider]  ALPRAZolam (XANAX) 0.25 MG tablet Take 1 tablet (0.25 mg total) at bedtime as needed by mouth for sleep. 04/25/17  Yes Bonnielee Haff, MD  docusate sodium (COLACE) 100 MG capsule Take 1 capsule (100 mg total) daily as needed by mouth for mild constipation. 04/25/17  Yes Bonnielee Haff, MD  escitalopram (LEXAPRO) 10 MG tablet Take 10 mg by mouth daily.   Yes [provider]  furosemide (LASIX) 40 MG tablet Take 0.5 tablets (20 mg total) daily by mouth. Patient taking differently: Take 40 mg by mouth daily as needed (for weight gain greater than 2lbs).  04/25/17  Yes Bonnielee Haff, MD  HYDROcodone-acetaminophen (NORCO/VICODIN) 5-325 MG tablet Take 1-2 tablets every 4 (four) hours as needed by mouth for moderate pain. Patient taking differently: Take 1 tablet by mouth daily.  04/25/17  Yes Bonnielee Haff, MD  ipratropium-albuterol (DUONEB) 0.5-2.5 (3) MG/3ML SOLN Take 3 mLs by nebulization every 6 (six) hours as needed (for wheezing).   Yes [provider]  metoprolol succinate (TOPROL-XL) 25 MG 24 hr tablet Take 25 mg by mouth daily.    Yes [provider]  midodrine (PROAMATINE) 2.5 MG tablet Take 2.5 mg by mouth as needed (when systolic BP is lower than 100).    Yes [provider]  mirtazapine (REMERON) 15 MG tablet Take 7.5 mg by mouth at bedtime.    Yes [provider]  ondansetron (ZOFRAN) 4 MG tablet Take 4 mg by mouth every 6 (six) hours as needed for nausea or vomiting.   Yes [provider]  potassium chloride SA (K-DUR,KLOR-CON) 20 MEQ tablet Take 1 tablet (20 mEq total) daily by mouth. Patient taking differently: Take 20 mEq by mouth daily as needed (with Lasix).  04/25/17  Yes Bonnielee Haff, MD  predniSONE (DELTASONE) 10 MG tablet Take 6 tablets once daily for 3 days, then take 4 tablets once daily for 3 days, then take 2 tablets once daily for 3 days and then 1 tablet once daily as before. Patient taking differently: 10 mg daily.  04/25/17  Yes Bonnielee Haff, MD  umeclidinium-vilanterol [facility] ELLIPTA) 62.5-25 MCG/INH AEPB Inhale 1 puff into the lungs daily. 08/16/17  Yes Mannam, Praveen, MD   No Known Allergies Review of Systems  Respiratory: Positive for cough and shortness of breath.   Neurological:  Positive for weakness.    Physical Exam  Constitutional: She appears ill.  Pulmonary/Chest:  Cough, audible wheezing. Dyspnea with speaking and moving onto bedside toilet.   Neurological: She is alert.    Vital Signs: BP (!) 142/86 (BP Location: Left Arm)   Pulse 78   Temp 98.9 F (37.2 C) (Oral)   Resp (!) 26   Ht 5\' 5"  (1.651 m)   Wt 68.3 kg (150 lb 9.2 oz)   SpO2 92%   BMI 25.06 kg/m  Pain Scale: 0-10   Pain Score: 0-No pain   SpO2: SpO2: 92 % O2 Device:SpO2: 92 % O2 Flow Rate: .O2 Flow Rate (L/min): 2 L/min  IO: Intake/output summary:   Intake/Output Summary (Last 24 hours) at 11/22/2017 1224 Last data filed at 11/22/2017 1150 Gross per 24 hour  Intake 400 ml  Output 1800 ml  Net -1400 ml    LBM: Last BM Date: 11/19/17 Baseline Weight: Weight: 66.7 kg (147 lb) Most recent weight: Weight: 68.3 kg (150 lb 9.2 oz)     Palliative Assessment/Data: 50%     Time In: 10:30 Time Out: 12:00 Time Total:90 min Greater than 50%  of this time was spent counseling and coordinating care related to the above assessment and plan.  Signed by: Asencion Gowda, NP   Please contact Palliative Medicine Team phone at [phone] for questions and concerns.  For individual provider: See Shea Evans

## 2017-11-22 NOTE — Progress Notes (Signed)
PROGRESS NOTE    [name]  QBH:[id number] DOB: April 04, 1931 DOA: 11/20/2017 PCP: Debbie Manes, MD     Brief Narrative:  [name] a 84 y.o.femalewith medical history significant ofsick sinus syndrome and complete heart block status post pacemaker, paroxysmal atrial fibrillation, COPD, hypertension, recurrent pneumonia, dementia, anxiety.Patient states that she woke up feeling very short of breath. She states that she has been having increased shortness of breath over the last 3 days with coughing and sputum production that is clear. She does not report any wheezing.  She was admitted for HCAP treatment.   New events last 24 hours / Subjective: No new events overnight.  Patient seen at bedside with daughter, palliative care medicine team present.  Patient states that currently, she feels better than she had since admission, although daughter at bedside states that just 15 minutes ago, patient had reported she had felt the worst she had felt since admission.   Assessment & Plan:   Principal Problem:   HCAP (healthcare-associated pneumonia) Active Problems:   Pacemaker   CHB (complete heart block) (HCC)   SSS (sick sinus syndrome) (HCC)   Paroxysmal atrial fibrillation (HCC)   COPD (chronic obstructive pulmonary disease) (HCC)   Benign essential HTN   Acute hypoxemic respiratory failure (HCC)   Chronic diastolic CHF (congestive heart failure) (HCC)   Right middle lobe pneumonia ([place])   HCAP with +Rhinovirus  -CXR revealed RML infiltrate -MRSA negative, vancomycin discontinued. Continue IV zosyn   Acute hypoxemic respiratory failure -Secondary to above -Continue to wean nasal cannula oxygen as able  Paroxysmal A. fib not on anticoagulation -Rate controlled -Continue Toprol-XL  Chronic depression/anxiety -Continue Lexapro, mirtazapine, as needed Xanax nightly  Chronic systolic CHF with LVEF 45 to 50% -Last 2D echo done on 10/11/2016 revealed LVEF 45 to  50% with hypokinesis of the apical myocardium and a trivial pericardial effusion identified. -Without decompensation currently.  Continue home Lasix  Chronic normocytic anemia, iron deficiency anemia -Start iron supplementation  Low TSH -Check free T4   DVT prophylaxis: Lovenox  Code Status: DNR Family Communication: Daughter at bedside Disposition Plan: Pending improvement in O2 requirement, SNF when stable   Consultants:   Palliative care   Procedures:   None   Antimicrobials:  Anti-infectives (From admission, onward)   Start     Dose/Rate Route Frequency Ordered Stop   11/21/17 2200  piperacillin-tazobactam (ZOSYN) IVPB 3.375 g     3.375 g 12.5 mL/hr over 240 Minutes Intravenous Every 8 hours 11/21/17 1005     11/21/17 1000  vancomycin (VANCOCIN) IVPB 1000 mg/200 mL premix  Status:  Discontinued     1,000 mg 200 mL/hr over 60 Minutes Intravenous Every 24 hours 11/20/17 1645 11/21/17 1005   11/20/17 2200  ceFEPIme (MAXIPIME) 1 g in sodium chloride 0.9 % 100 mL IVPB  Status:  Discontinued     1 g 200 mL/hr over 30 Minutes Intravenous Every 12 hours 11/20/17 1645 11/21/17 1005   11/20/17 1730  vancomycin (VANCOCIN) IVPB 1000 mg/200 mL premix     1,000 mg 200 mL/hr over 60 Minutes Intravenous  Once 11/20/17 1645 11/20/17 1856   11/20/17 1500  cefTRIAXone (ROCEPHIN) 1 g in sodium chloride 0.9 % 100 mL IVPB  Status:  Discontinued     1 g 200 mL/hr over 30 Minutes Intravenous  Once 11/20/17 1451 11/20/17 1636   11/20/17 1500  azithromycin (ZITHROMAX) 500 mg in sodium chloride 0.9 % 250 mL IVPB  Status:  Discontinued  500 mg 250 mL/hr over 60 Minutes Intravenous  Once 11/20/17 1451 11/20/17 1735       Objective: Vitals:   11/22/17 0744 11/22/17 0748 11/22/17 0820 11/22/17 0838  BP: (!) 145/90 (!) 142/86    Pulse: 78     Resp: (!) 26     Temp: 98.9 F (37.2 C)     TempSrc: Oral     SpO2: 94%  95% 92%  Weight:      Height:        Intake/Output Summary (Last  24 hours) at 11/22/2017 1244 Last data filed at 11/22/2017 1150 Gross per 24 hour  Intake 50 ml  Output 1800 ml  Net -1750 ml   Filed Weights   11/20/17 0942 11/21/17 0500  Weight: 66.7 kg (147 lb) 68.3 kg (150 lb 9.2 oz)    Examination:  General exam: Appears calm and comfortable  Respiratory system: Diminished at bases without wheeze or rhonchi. Respiratory effort normal. Cardiovascular system: S1 & S2 heard, RRR. No JVD, murmurs, rubs, gallops or clicks. No pedal edema. Gastrointestinal system: Abdomen is nondistended, soft and nontender. No organomegaly or masses felt. Normal bowel sounds heard. Central nervous system: Alert and oriented. No focal neurological deficits. Extremities: Symmetric 5 x 5 power. Skin: No rashes, lesions or ulcers Psychiatry: Judgement and insight appear stable  Data Reviewed: I have personally reviewed following labs and imaging studies  CBC: Recent Labs  Lab 11/20/17 0944 11/22/17 0737  WBC 10.8* 13.3*  HGB 10.5* 10.4*  HCT 35.3* 35.5*  MCV 86.7 88.8  PLT 229 284   Basic Metabolic Panel: Recent Labs  Lab 11/20/17 0944 11/22/17 0737  NA 140 141  K 3.6 4.5  CL 104 103  CO2 26 31  GLUCOSE 122* 98  BUN 17 19  CREATININE 0.80 0.89  CALCIUM 8.2* 8.4*  MG  --  1.9   GFR: Estimated Creatinine Clearance: 42.3 mL/min (by C-G formula based on SCr of 0.89 mg/dL). Liver Function Tests: No results for input(s): AST, ALT, ALKPHOS, BILITOT, PROT, ALBUMIN in the last 168 hours. No results for input(s): LIPASE, AMYLASE in the last 168 hours. No results for input(s): AMMONIA in the last 168 hours. Coagulation Profile: No results for input(s): INR, PROTIME in the last 168 hours. Cardiac Enzymes: Recent Labs  Lab 11/20/17 0944 11/21/17 0505  TROPONINI 0.04* 0.03*   BNP (last 3 results) No results for input(s): PROBNP in the last 8760 hours. HbA1C: No results for input(s): HGBA1C in the last 72 hours. CBG: No results for input(s): GLUCAP in  the last 168 hours. Lipid Profile: No results for input(s): CHOL, HDL, LDLCALC, TRIG, CHOLHDL, LDLDIRECT in the last 72 hours. Thyroid Function Tests: Recent Labs    11/22/17 0737  TSH 0.028*   Anemia Panel: Recent Labs    11/22/17 0737  VITAMINB12 240  FERRITIN 32  TIBC 434  IRON 31  RETICCTPCT 1.5   Sepsis Labs: Recent Labs  Lab 11/21/17 0505  PROCALCITON <0.10    Recent Results (from the past 240 hour(s))  MRSA PCR Screening     Status: None   Collection Time: 11/20/17  5:08 PM  Result Value Ref Range Status   MRSA by PCR NEGATIVE NEGATIVE Final    Comment:        The GeneXpert MRSA Assay (FDA approved for NASAL specimens only), is one component of a comprehensive MRSA colonization surveillance program. It is not intended to diagnose MRSA infection nor to guide or monitor treatment for  MRSA infections. Performed at [facility], [place] [address] [place], [place] [address]   Respiratory Panel by PCR     Status: Abnormal   Collection Time: 11/20/17  5:26 PM  Result Value Ref Range Status   Adenovirus NOT DETECTED NOT DETECTED Final   Coronavirus 229E NOT DETECTED NOT DETECTED Final   Coronavirus HKU1 NOT DETECTED NOT DETECTED Final   Coronavirus NL63 NOT DETECTED NOT DETECTED Final   Coronavirus OC43 NOT DETECTED NOT DETECTED Final   Metapneumovirus NOT DETECTED NOT DETECTED Final   Rhinovirus / Enterovirus DETECTED (A) NOT DETECTED Final   Influenza A NOT DETECTED NOT DETECTED Final   Influenza B NOT DETECTED NOT DETECTED Final   Parainfluenza Virus 1 NOT DETECTED NOT DETECTED Final   Parainfluenza Virus 2 NOT DETECTED NOT DETECTED Final   Parainfluenza Virus 3 NOT DETECTED NOT DETECTED Final   Parainfluenza Virus 4 NOT DETECTED NOT DETECTED Final   Respiratory Syncytial Virus NOT DETECTED NOT DETECTED Final   Bordetella pertussis NOT DETECTED NOT DETECTED Final   Chlamydophila pneumoniae NOT DETECTED NOT DETECTED Final   Mycoplasma  pneumoniae NOT DETECTED NOT DETECTED Final    Comment: Performed at [facility], [place] [address] [place], [place] [address]  Culture, blood (routine x 2) Call MD if unable to obtain prior to antibiotics being given     Status: None (Preliminary result)   Collection Time: 11/20/17  5:33 PM  Result Value Ref Range Status   Specimen Description   Final    BLOOD RIGHT HAND Performed at [facility] [address], [place], [place] [address]    Special Requests   Final    BOTTLES DRAWN AEROBIC AND ANAEROBIC Blood Culture adequate volume Performed at [facility] [address], [place], [place] [address]    Culture   Final    NO GROWTH < 24 HOURS Performed at [facility] [address], [place], [place] [address]    Report Status PENDING  Incomplete  Culture, blood (routine x 2) Call MD if unable to obtain prior to antibiotics being given     Status: None (Preliminary result)   Collection Time: 11/20/17  5:46 PM  Result Value Ref Range Status   Specimen Description   Final    BLOOD LEFT HAND Performed at [facility] [address] [place], [place] [address]    Special Requests   Final    BOTTLES DRAWN AEROBIC AND ANAEROBIC Blood Culture adequate volume Performed at [facility] [address], [place], [place] [address]    Culture   Final    NO GROWTH < 24 HOURS Performed at [facility] [address], [place], [place] [address]    Report Status PENDING  Incomplete       Radiology Studies: Ct Chest Wo Contrast  Result Date: 11/20/2017 CLINICAL DATA:  Worsening shortness of breath.  Cough, COPD. EXAM: CT CHEST WITHOUT CONTRAST TECHNIQUE: Multidetector CT imaging of the chest was performed following the standard protocol without IV contrast. COMPARISON:  04/22/2017. FINDINGS: Cardiovascular: Atherosclerotic calcification of the arterial vasculature, including three-vessel  involvement of the coronary arteries. Pulmonary arteries and heart are enlarged. No pericardial effusion. Mediastinum/Nodes: Mediastinal lymph nodes are not enlarged by CT size criteria. Hilar regions are difficult to evaluate without IV contrast. No axillary adenopathy. Esophagus is grossly unremarkable. Lungs/Pleura: Mild centrilobular emphysema. Right middle lobe collapse/consolidation. Mild smooth septal thickening with small  bilateral pleural effusions. Compressive atelectasis in both lower lobes. There may be post infectious/postinflammatory scarring in the peripheral right lower lobe. Mild peribronchial thickening. Upper Abdomen: Numerous low-attenuation lesions are again seen in the liver, measuring up to 3.4 cm, similar. Visualized portions of the adrenal glands, kidneys, spleen and stomach are grossly unremarkable. No upper abdominal adenopathy. Musculoskeletal: Degenerative changes in the spine. T8 compression fracture and T9 vertebral body augmentation are unchanged. No worrisome lytic or sclerotic lesions. IMPRESSION: 1. New collapse/consolidation in the right middle lobe. Difficult to exclude a centrally obstructing lesion. 2. Mild septal thickening with bilateral pleural effusions, indicative of congestive heart failure. 3. Aortic atherosclerosis (ICD10-170.0). Coronary artery calcification. 4. Enlarged pulmonary arteries, indicative of pulmonary arterial hypertension. Electronically Signed   By: Lorin Picket M.D.   On: 11/20/2017 14:17      Scheduled Meds: . enoxaparin (LOVENOX) injection  40 mg Subcutaneous Q24H  . escitalopram  10 mg Oral Daily  . furosemide  20 mg Oral Daily  . guaiFENesin  1,200 mg Oral BID  . ipratropium-albuterol  3 mL Nebulization Q6H  . ketoconazole   Topical BID  . metoprolol succinate  25 mg Oral Daily  . mirtazapine  7.5 mg Oral QHS  . potassium chloride SA  20 mEq Oral Daily  . sodium chloride HYPERTONIC  4 mL Nebulization BID  . umeclidinium-vilanterol   1 puff Inhalation Daily   Continuous Infusions: . ferumoxytol    . piperacillin-tazobactam (ZOSYN)  IV Stopped (11/22/17 3817)     LOS: 2 days    Time spent: 40 minutes   Dessa Phi, DO [facility] [email or web address] Password [facility] 11/22/2017, 12:44 PM

## 2017-11-23 DIAGNOSIS — J449 Chronic obstructive pulmonary disease, unspecified: Secondary | ICD-10-CM

## 2017-11-23 DIAGNOSIS — I480 Paroxysmal atrial fibrillation: Secondary | ICD-10-CM

## 2017-11-23 DIAGNOSIS — J189 Pneumonia, unspecified organism: Secondary | ICD-10-CM

## 2017-11-23 DIAGNOSIS — J9601 Acute respiratory failure with hypoxia: Secondary | ICD-10-CM

## 2017-11-23 LAB — CBC
HEMATOCRIT: 35.8 % — AB (ref 36.0–46.0)
HEMOGLOBIN: 10.4 g/dL — AB (ref 12.0–15.0)
MCH: 25.7 pg — ABNORMAL LOW (ref 26.0–34.0)
MCHC: 29.1 g/dL — ABNORMAL LOW (ref 30.0–36.0)
MCV: 88.6 fL (ref 78.0–100.0)
Platelets: 252 10*3/uL (ref 150–400)
RBC: 4.04 MIL/uL (ref 3.87–5.11)
RDW: 18 % — AB (ref 11.5–15.5)
WBC: 10.9 10*3/uL — AB (ref 4.0–10.5)

## 2017-11-23 LAB — FOLATE RBC
FOLATE, RBC: 1097 ng/mL (ref >498)
Folate, Hemolysate: 376.1 ng/mL
Hematocrit: 34.3 % (ref 34.0–46.6)

## 2017-11-23 LAB — BASIC METABOLIC PANEL
ANION GAP: 7 (ref 5–15)
BUN: 13 mg/dL (ref 6–20)
CALCIUM: 8.8 mg/dL — AB (ref 8.9–10.3)
CHLORIDE: 104 mmol/L (ref 101–111)
CO2: 31 mmol/L (ref 22–32)
Creatinine, Ser: 0.79 mg/dL (ref 0.44–1.00)
GFR calc non Af Amer: >60 mL/min (ref >60)
Glucose, Bld: 103 mg/dL — ABNORMAL HIGH (ref 65–99)
POTASSIUM: 4.1 mmol/L (ref 3.5–5.1)
Sodium: 142 mmol/L (ref 135–145)

## 2017-11-23 MED ORDER — IPRATROPIUM-ALBUTEROL 0.5-2.5 (3) MG/3ML IN SOLN
3.0000 mL | Freq: Three times a day (TID) | RESPIRATORY_TRACT | Status: DC
  Administered 2017-11-23 – 2017-11-24 (×4): 3 mL via RESPIRATORY_TRACT
  Filled 2017-11-23 (×4): qty 3

## 2017-11-23 NOTE — Progress Notes (Signed)
Physical Therapy Treatment Patient Details Name: [name] MRN: [id number] DOB: 25-Jul-1930 Today's Date: 11/23/2017    History of Present Illness 84 y.o. female with medical history significant of sick sinus syndrome and complete heart block status post pacemaker, paroxysmal atrial fibrillation, COPD, hypertension, recurrent pneumonia, dementia, anxiety, kyphoplasty, and admitted for acute respiratory failure with hypoxia and found to have right middle lobe pneumonia    PT Comments    Patient exhibited difficulty with O2 sats with transitions and required cuing to breathe in through nose and out through mouth. Patient required assistance for bed mobility, transfers and ambulation which was limited to 40 ft with RW. Patient then c/o nausea and the need for bedside commode of which she was incontinent. Nursing was notified. Patient exhibited impulsivity detrimental to her safety and increases her risk of falls. Patient's level assistance and tolerance of activity is not improving with medical treatment at this time. Pt admitted with above diagnosis. Pt currently with functional limitations due to the deficits listed below (see PT Problem List). Pt will benefit from skilled PT to increase their independence and safety with mobility to allow discharge to the venue listed below.    SATURATION QUALIFICATIONS: (This note is used to comply with regulatory documentation for home oxygen)  Patient Saturations on Room Air at Rest = 87%  Patient Saturations on Room Air while Ambulating = 83%  Patient Saturations on 2 Liters of oxygen while Ambulating = 89%  Please briefly explain why patient needs home oxygen: to improve oxygen saturations with ambulation and physical activity. Patient required 3 L O2 to register O2 sats 90-91%.    Follow Up Recommendations  SNF;Supervision for mobility/OOB;Other (comment)(HHPT if refuses SNF)     Equipment Recommendations  None recommended by PT     Recommendations for Other Services       Precautions / Restrictions Precautions Precautions: Fall Precaution Comments: monitor sats Restrictions Weight Bearing Restrictions: No    Mobility  Bed Mobility Overal bed mobility: Needs Assistance Bed Mobility: Supine to Sit     Supine to sit: Min assist     General bed mobility comments: assist for line management  Transfers Overall transfer level: Needs assistance Equipment used: Rolling walker (2 wheeled) Transfers: Sit to/from [facility] Sit to Stand: Min guard Stand pivot transfers: Min assist       General transfer comment: asssitance and verbal cues for safe technique; impulsivity noted  Ambulation/Gait Ambulation/Gait assistance: Min assist Ambulation Distance (Feet): 40 Feet Assistive device: Rolling walker (2 wheeled) Gait Pattern/deviations: Step-through pattern;Decreased stride length Gait velocity: decreased   General Gait Details: SPO2 dropped to 82% on room air with ambulation, c/o nausea during ambulation   Stairs             Wheelchair Mobility    Modified Rankin (Stroke Patients Only)       Balance Overall balance assessment: Needs assistance(denies any recent falls) Sitting-balance support: Bilateral upper extremity supported;Feet supported Sitting balance-Leahy Scale: Fair     Standing balance support: Bilateral upper extremity supported;During functional activity Standing balance-Leahy Scale: Poor                              Cognition Arousal/Alertness: Awake/alert Behavior During Therapy: WFL for tasks assessed/performed Overall Cognitive Status: History of cognitive impairments - at baseline  Exercises      General Comments        Pertinent Vitals/Pain Pain Assessment: No/denies pain    Home Living                      Prior Function            PT Goals (current goals  can now be found in the care plan section) Acute Rehab PT Goals PT Goal Formulation: With patient/family Time For Goal Achievement: 12/07/17 Potential to Achieve Goals: Fair Progress towards PT goals: Not progressing toward goals - comment(patient's mobility status and O2 sats are not improving)    Frequency    Min 3X/week      PT Plan Discharge plan needs to be updated;Other (comment)(patient's mobility and independence are not improving, nor are O2 sats)    Co-evaluation              AM-PAC PT "6 Clicks" Daily Activity  Outcome Measure  Difficulty turning over in bed (including adjusting bedclothes, sheets and blankets)?: A Little Difficulty moving from lying on back to sitting on the side of the bed? : A Little Difficulty sitting down on and standing up from a chair with arms (e.g., wheelchair, bedside commode, etc,.)?: A Little Help needed moving to and from a bed to chair (including a wheelchair)?: A Little Help needed walking in hospital room?: A Little Help needed climbing 3-5 steps with a railing? : A Lot 6 Click Score: 17    End of Session Equipment Utilized During Treatment: Gait belt;Oxygen Activity Tolerance: Patient limited by fatigue Patient left: in bed;with nursing/sitter in room;with family/visitor present Nurse Communication: Mobility status PT Visit Diagnosis: Other abnormalities of gait and mobility (R26.89);History of falling (Z91.81);Unsteadiness on feet (R26.81)     Time: 9450-3888 PT Time Calculation (min) (ACUTE ONLY): 34 min  Charges:  $Gait Training: 8-22 mins $Therapeutic Activity: 8-22 mins                    G Codes:       [name] D. Hartnett-Rands, MS, PT Per [place] [phone] 11/23/2017, 1:58 PM

## 2017-11-23 NOTE — Care Management Note (Signed)
Case Management Note  Patient Details  Name: [name] MRN: [id number] Date of Birth: 14-Jul-1930  Subjective/Objective: 84 yo admitted with HCAP.                  Action/Plan: Pt lives at [facility]. PT eval recommendations gone over with pt and daughter at bedside. She has used [facility] for home health services in the past and would like to use them again. Also, pt may need to dc on home 02. Request has been made to nursing for a desaturation screen. CM will continue to follow.  Expected Discharge Date:  (unknown)               Expected Discharge Plan:  [place]  In-House Referral:  Clinical Social Work  Discharge planning Services  CM Consult  Post Acute Care Choice:  Home Health Choice offered to:  Patient, Adult Children  DME Arranged:    DME Agency:     HH Arranged:  RN, PT [place] Agency:  [place]  Status of Service:  In process, will continue to follow  If discussed at Long Length of Stay Meetings, dates discussed:    Additional Comments:  Lynnell Catalan, RN 11/23/2017, 10:37 AM

## 2017-11-23 NOTE — Progress Notes (Signed)
Patient ID: [name], female   DOB: 30-Dec-1930, 84 y.o.   MRN: [id number]                                                                PROGRESS NOTE                                                                                                                                                                                                             Patient Demographics:    [name], is a 84 y.o. female, DOB - 04/13/1931, XLK:[id number]  Admit date - 11/20/2017   Admitting Physician Mariel Aloe, MD  Outpatient Primary MD for the patient is Lajean Manes, MD  LOS - 3  Outpatient Specialists:    Chief Complaint  Patient presents with  . Shortness of Breath       Brief Narrative  84 y.o.femalewith medical history significant ofsick sinus syndrome and complete heart block status post pacemaker, paroxysmal atrial fibrillation, COPD, hypertension, recurrent pneumonia, dementia, anxiety.Patient states that she woke up feeling very short of breath. She states that she has been having increased shortness of breath over the last 3 days with coughing and sputum production that is clear. She does not report any wheezing.She was admitted for HCAP treatment.      Subjective:    [name] today hasstill some slight cough.  Slight wheezing.  Afebrile.  Breathing feels better per pt.   No headache, No chest pain, No abdominal pain - No Nausea, No new weakness tingling or numbness   Assessment  & Plan :    Principal Problem:   HCAP (healthcare-associated pneumonia) Active Problems:   Pacemaker   CHB (complete heart block) (HCC)   SSS (sick sinus syndrome) (HCC)   Paroxysmal atrial fibrillation (HCC)   COPD (chronic obstructive pulmonary disease) (HCC)   Benign essential HTN   Acute hypoxemic respiratory failure (HCC)   Chronic diastolic CHF (congestive heart failure) (HCC)   Right middle lobe pneumonia ([place])     HCAP with +Rhinovirus  -CXR revealed RML  infiltrate -MRSA negative, vancomycin discontinued. Continue IV zosyn   Acute hypoxemic respiratory failure -Secondary to above -Continue to wean nasal cannula oxygen as able PT to try to ambulate to check resting and ambulatory pox on RA. To determine  if needs home o2.   Paroxysmal A. fib not on anticoagulation -Rate controlled -Continue Toprol-XL  Chronic depression/anxiety -Continue Lexapro, mirtazapine, as needed Xanax nightly  Chronic systolic CHF with LVEF 45 to 50% -Last 2D echo done on 10/11/2016 revealed LVEF 45 to 50% with hypokinesis of the apical myocardium and a trivial pericardial effusion identified. -Without decompensation currently.  Continue home Lasix  Chronic normocytic anemia, iron deficiency anemia -Cont  iron supplementation (6/5)  Low TSH -Check free T4   DVT prophylaxis: Lovenox  Code Status: DNR Family Communication:  spoke with daughter Disposition Plan: Pending improvement in O2 requirement, SNF when stable   Consultants:   Palliative care   Procedures:   None      Lab Results  Component Value Date   PLT 252 11/23/2017    Antibiotics  :  vanco 6/4, 6/5,  Zosyn 6/4=>  Anti-infectives (From admission, onward)   Start     Dose/Rate Route Frequency Ordered Stop   11/21/17 2200  piperacillin-tazobactam (ZOSYN) IVPB 3.375 g     3.375 g 12.5 mL/hr over 240 Minutes Intravenous Every 8 hours 11/21/17 1005     11/21/17 1000  vancomycin (VANCOCIN) IVPB 1000 mg/200 mL premix  Status:  Discontinued     1,000 mg 200 mL/hr over 60 Minutes Intravenous Every 24 hours 11/20/17 1645 11/21/17 1005   11/20/17 2200  ceFEPIme (MAXIPIME) 1 g in sodium chloride 0.9 % 100 mL IVPB  Status:  Discontinued     1 g 200 mL/hr over 30 Minutes Intravenous Every 12 hours 11/20/17 1645 11/21/17 1005   11/20/17 1730  vancomycin (VANCOCIN) IVPB 1000 mg/200 mL premix     1,000 mg 200 mL/hr over 60 Minutes Intravenous  Once 11/20/17 1645 11/20/17 1856    11/20/17 1500  cefTRIAXone (ROCEPHIN) 1 g in sodium chloride 0.9 % 100 mL IVPB  Status:  Discontinued     1 g 200 mL/hr over 30 Minutes Intravenous  Once 11/20/17 1451 11/20/17 1636   11/20/17 1500  azithromycin (ZITHROMAX) 500 mg in sodium chloride 0.9 % 250 mL IVPB  Status:  Discontinued     500 mg 250 mL/hr over 60 Minutes Intravenous  Once 11/20/17 1451 11/20/17 1735        Objective:   Vitals:   11/22/17 1934 11/22/17 2159 11/23/17 0248 11/23/17 0558  BP:  (!) 154/63  (!) 152/57  Pulse:  79  79  Resp:  18  20  Temp:  98.8 F (37.1 C)  99.7 F (37.6 C)  TempSrc:  Oral  Oral  SpO2: 94% 100% 94% 97%  Weight:      Height:        Wt Readings from Last 3 Encounters:  11/21/17 68.3 kg (150 lb 9.2 oz)  11/08/17 67 kg (147 lb 9.6 oz)  08/16/17 67 kg (147 lb 9.6 oz)     Intake/Output Summary (Last 24 hours) at 11/23/2017 0739 Last data filed at 11/23/2017 0600 Gross per 24 hour  Intake 870 ml  Output 2350 ml  Net -1480 ml     Physical Exam  Awake Alert, Oriented X 3, No new F.N deficits, Normal affect [place].AT,PERRAL Supple Neck,No JVD, No cervical lymphadenopathy appriciated.  Symmetrical Chest wall movement, Good air movement bilaterally, slight wheezing, no crackles.  RRR,No Gallops,Rubs or new Murmurs, No Parasternal Heave +ve B.Sounds, Abd Soft, No tenderness, No organomegaly appriciated, No rebound - guarding or rigidity. No Cyanosis, Clubbing or edema, No new Rash or bruise  Data Review:    CBC Recent Labs  Lab 11/20/17 0944 11/22/17 0737 11/23/17 0351  WBC 10.8* 13.3* 10.9*  HGB 10.5* 10.4* 10.4*  HCT 35.3* 35.5* 35.8*  PLT 229 251 252  MCV 86.7 88.8 88.6  MCH 25.8* 26.0 25.7*  MCHC 29.7* 29.3* 29.1*  RDW 17.6* 18.0* 18.0*    Chemistries  Recent Labs  Lab 11/20/17 0944 11/22/17 0737 11/23/17 0351  NA 140 141 142  K 3.6 4.5 4.1  CL 104 103 104  CO2 26 31 31   GLUCOSE 122* 98 103*  BUN 17 19 13   CREATININE 0.80 0.89 0.79  CALCIUM 8.2*  8.4* 8.8*  MG  --  1.9  --    ------------------------------------------------------------------------------------------------------------------ No results for input(s): CHOL, HDL, LDLCALC, TRIG, CHOLHDL, LDLDIRECT in the last 72 hours.  No results found for: HGBA1C ------------------------------------------------------------------------------------------------------------------ Recent Labs    11/22/17 0737  TSH 0.028*   ------------------------------------------------------------------------------------------------------------------ Recent Labs    11/22/17 0737  VITAMINB12 240  FERRITIN 32  TIBC 434  IRON 31  RETICCTPCT 1.5    Coagulation profile No results for input(s): INR, PROTIME in the last 168 hours.  No results for input(s): DDIMER in the last 72 hours.  Cardiac Enzymes Recent Labs  Lab 11/20/17 0944 11/21/17 0505  TROPONINI 0.04* 0.03*   ------------------------------------------------------------------------------------------------------------------    Component Value Date/Time   BNP 706.9 (H) 11/20/2017 0944   BNP 525.6 (H) 11/04/2015 1522    Inpatient Medications  Scheduled Meds: . enoxaparin (LOVENOX) injection  40 mg Subcutaneous Q24H  . escitalopram  10 mg Oral Daily  . furosemide  20 mg Oral Daily  . guaiFENesin  1,200 mg Oral BID  . ipratropium-albuterol  3 mL Nebulization TID  . ketoconazole   Topical BID  . metoprolol succinate  25 mg Oral Daily  . mirtazapine  7.5 mg Oral QHS  . potassium chloride SA  20 mEq Oral Daily  . sodium chloride HYPERTONIC  4 mL Nebulization BID  . umeclidinium-vilanterol  1 puff Inhalation Daily   Continuous Infusions: . piperacillin-tazobactam (ZOSYN)  IV 3.375 g (11/23/17 0555)   PRN Meds:.ALPRAZolam, docusate sodium, guaiFENesin-dextromethorphan, HYDROcodone-acetaminophen, ondansetron  Micro Results Recent Results (from the past 240 hour(s))  MRSA PCR Screening     Status: None   Collection Time:  11/20/17  5:08 PM  Result Value Ref Range Status   MRSA by PCR NEGATIVE NEGATIVE Final    Comment:        The GeneXpert MRSA Assay (FDA approved for NASAL specimens only), is one component of a comprehensive MRSA colonization surveillance program. It is not intended to diagnose MRSA infection nor to guide or monitor treatment for MRSA infections. Performed at [facility], [place] [address] [place], [place] [address]   Respiratory Panel by PCR     Status: Abnormal   Collection Time: 11/20/17  5:26 PM  Result Value Ref Range Status   Adenovirus NOT DETECTED NOT DETECTED Final   Coronavirus 229E NOT DETECTED NOT DETECTED Final   Coronavirus HKU1 NOT DETECTED NOT DETECTED Final   Coronavirus NL63 NOT DETECTED NOT DETECTED Final   Coronavirus OC43 NOT DETECTED NOT DETECTED Final   Metapneumovirus NOT DETECTED NOT DETECTED Final   Rhinovirus / Enterovirus DETECTED (A) NOT DETECTED Final   Influenza A NOT DETECTED NOT DETECTED Final   Influenza B NOT DETECTED NOT DETECTED Final   Parainfluenza Virus 1 NOT DETECTED NOT DETECTED Final   Parainfluenza Virus 2 NOT DETECTED NOT DETECTED Final  Parainfluenza Virus 3 NOT DETECTED NOT DETECTED Final   Parainfluenza Virus 4 NOT DETECTED NOT DETECTED Final   Respiratory Syncytial Virus NOT DETECTED NOT DETECTED Final   Bordetella pertussis NOT DETECTED NOT DETECTED Final   Chlamydophila pneumoniae NOT DETECTED NOT DETECTED Final   Mycoplasma pneumoniae NOT DETECTED NOT DETECTED Final    Comment: Performed at [facility], [place] [address] [place], [place] [address]  Culture, blood (routine x 2) Call MD if unable to obtain prior to antibiotics being given     Status: None (Preliminary result)   Collection Time: 11/20/17  5:33 PM  Result Value Ref Range Status   Specimen Description   Final    BLOOD RIGHT HAND Performed at [facility] [address], [place], [place] [address]    Special Requests    Final    BOTTLES DRAWN AEROBIC AND ANAEROBIC Blood Culture adequate volume Performed at [facility] [address], [place], [place] [address]    Culture   Final    NO GROWTH 2 DAYS Performed at [facility] [address], [place], [place] [address]    Report Status PENDING  Incomplete  Culture, blood (routine x 2) Call MD if unable to obtain prior to antibiotics being given     Status: None (Preliminary result)   Collection Time: 11/20/17  5:46 PM  Result Value Ref Range Status   Specimen Description   Final    BLOOD LEFT HAND Performed at [facility] [address], [place], [place] [address]    Special Requests   Final    BOTTLES DRAWN AEROBIC AND ANAEROBIC Blood Culture adequate volume Performed at [facility] [address], [place], [place] [address]    Culture   Final    NO GROWTH 2 DAYS Performed at [facility] [address], [place],  [address]    Report Status PENDING  Incomplete    Radiology Reports Dg Chest 2 View  Result Date: 11/20/2017 CLINICAL DATA:  Cough EXAM: CHEST - 2 VIEW COMPARISON:  Chest radiograph April 21, 2017 and chest CT April 22, 2017 FINDINGS: There is a small pleural effusion on the left. There is atelectatic change in the lung bases. There is no frank consolidation. Heart is upper normal in size with pulmonary vascularity normal. No adenopathy. Pacemaker leads are attached the right atrium and right ventricle. Patient is undergone kyphoplasty procedure in the lower thoracic region. IMPRESSION: Small left pleural effusion with patchy atelectasis in both lower lobes. No frank consolidation. Heart upper normal in size. There is aortic atherosclerosis. Pacemaker leads attached right atrium and right ventricle. Aortic Atherosclerosis (ICD10-I70.0). Electronically Signed   By: Lowella Grip III M.D.   On: 11/20/2017 11:00   Ct Chest Wo Contrast  Result Date:  11/20/2017 CLINICAL DATA:  Worsening shortness of breath.  Cough, COPD. EXAM: CT CHEST WITHOUT CONTRAST TECHNIQUE: Multidetector CT imaging of the chest was performed following the standard protocol without IV contrast. COMPARISON:  04/22/2017. FINDINGS: Cardiovascular: Atherosclerotic calcification of the arterial vasculature, including three-vessel involvement of the coronary arteries. Pulmonary arteries and heart are enlarged. No pericardial effusion. Mediastinum/Nodes: Mediastinal lymph nodes are not enlarged by CT size criteria. Hilar regions are difficult to evaluate without IV contrast. No axillary adenopathy. Esophagus is grossly unremarkable. Lungs/Pleura: Mild centrilobular emphysema. Right middle lobe collapse/consolidation. Mild smooth septal thickening with small bilateral pleural effusions. Compressive atelectasis in both lower lobes. There may  be post infectious/postinflammatory scarring in the peripheral right lower lobe. Mild peribronchial thickening. Upper Abdomen: Numerous low-attenuation lesions are again seen in the liver, measuring up to 3.4 cm, similar. Visualized portions of the adrenal glands, kidneys, spleen and stomach are grossly unremarkable. No upper abdominal adenopathy. Musculoskeletal: Degenerative changes in the spine. T8 compression fracture and T9 vertebral body augmentation are unchanged. No worrisome lytic or sclerotic lesions. IMPRESSION: 1. New collapse/consolidation in the right middle lobe. Difficult to exclude a centrally obstructing lesion. 2. Mild septal thickening with bilateral pleural effusions, indicative of congestive heart failure. 3. Aortic atherosclerosis (ICD10-170.0). Coronary artery calcification. 4. Enlarged pulmonary arteries, indicative of pulmonary arterial hypertension. Electronically Signed   By: Lorin Picket M.D.   On: 11/20/2017 14:17    Time Spent in minutes  30   Jani Gravel M.D on 11/23/2017 at 7:39 AM  Between 7am to 7pm - Pager -  [phone]  After 7pm go to [email or web address] - password [facility]  [facility] -  Office  [phone]

## 2017-11-24 DIAGNOSIS — J181 Lobar pneumonia, unspecified organism: Principal | ICD-10-CM

## 2017-11-24 DIAGNOSIS — I5032 Chronic diastolic (congestive) heart failure: Secondary | ICD-10-CM

## 2017-11-24 LAB — COMPREHENSIVE METABOLIC PANEL
ALBUMIN: 3.2 g/dL — AB (ref 3.5–5.0)
ALT: 11 U/L — ABNORMAL LOW (ref 14–54)
ANION GAP: 6 (ref 5–15)
AST: 17 U/L (ref 15–41)
Alkaline Phosphatase: 59 U/L (ref 38–126)
BUN: 11 mg/dL (ref 6–20)
CHLORIDE: 103 mmol/L (ref 101–111)
CO2: 32 mmol/L (ref 22–32)
Calcium: 8.6 mg/dL — ABNORMAL LOW (ref 8.9–10.3)
Creatinine, Ser: 0.82 mg/dL (ref 0.44–1.00)
GFR calc Af Amer: >60 mL/min (ref >60)
GFR calc non Af Amer: >60 mL/min (ref >60)
GLUCOSE: 114 mg/dL — AB (ref 65–99)
POTASSIUM: 4.1 mmol/L (ref 3.5–5.1)
Sodium: 141 mmol/L (ref 135–145)
Total Bilirubin: 1.2 mg/dL (ref 0.3–1.2)
Total Protein: 6.5 g/dL (ref 6.5–8.1)

## 2017-11-24 LAB — CBC
HCT: 38.3 % (ref 36.0–46.0)
Hemoglobin: 11.3 g/dL — ABNORMAL LOW (ref 12.0–15.0)
MCH: 26 pg (ref 26.0–34.0)
MCHC: 29.5 g/dL — AB (ref 30.0–36.0)
MCV: 88 fL (ref 78.0–100.0)
PLATELETS: 275 10*3/uL (ref 150–400)
RBC: 4.35 MIL/uL (ref 3.87–5.11)
RDW: 17.8 % — ABNORMAL HIGH (ref 11.5–15.5)
WBC: 11 10*3/uL — AB (ref 4.0–10.5)

## 2017-11-24 MED ORDER — CEFDINIR 300 MG PO CAPS: 300.0000 mg | ORAL_CAPSULE | Freq: Two times a day (BID) | ORAL | 0 refills | Status: AC

## 2017-11-24 MED ORDER — GUAIFENESIN ER 600 MG PO TB12: 1200.0000 mg | ORAL_TABLET | Freq: Two times a day (BID) | ORAL | 0 refills | Status: DC

## 2017-11-24 MED ORDER — ALBUTEROL SULFATE (2.5 MG/3ML) 0.083% IN NEBU: 2.5000 mg | INHALATION_SOLUTION | Freq: Four times a day (QID) | RESPIRATORY_TRACT | 12 refills | Status: DC | PRN

## 2017-11-24 MED ORDER — ALBUTEROL SULFATE (2.5 MG/3ML) 0.083% IN NEBU: 2.5000 mg | INHALATION_SOLUTION | Freq: Three times a day (TID) | RESPIRATORY_TRACT | 12 refills | Status: DC

## 2017-11-24 MED ORDER — GUAIFENESIN-DM 100-10 MG/5ML PO SYRP: 10.0000 mL | ORAL_SOLUTION | ORAL | 0 refills | Status: AC | PRN

## 2017-11-24 MED ORDER — ALBUTEROL SULFATE (2.5 MG/3ML) 0.083% IN NEBU: 2.5000 mg | INHALATION_SOLUTION | Freq: Three times a day (TID) | RESPIRATORY_TRACT | Status: DC

## 2017-11-24 MED ORDER — PREDNISONE 10 MG PO TABS: ORAL_TABLET | ORAL | 0 refills | Status: DC

## 2017-11-24 MED ORDER — ALBUTEROL SULFATE (2.5 MG/3ML) 0.083% IN NEBU: 2.5000 mg | INHALATION_SOLUTION | Freq: Four times a day (QID) | RESPIRATORY_TRACT | Status: DC | PRN

## 2017-11-24 MED ORDER — PREDNISONE 20 MG PO TABS
60.0000 mg | ORAL_TABLET | Freq: Every day | ORAL | Status: DC
  Administered 2017-11-24: 60 mg via ORAL
  Filled 2017-11-24 (×2): qty 3

## 2017-11-24 MED ORDER — METHYLPREDNISOLONE SODIUM SUCC 125 MG IJ SOLR
80.0000 mg | Freq: Once | INTRAMUSCULAR | Status: AC
  Administered 2017-11-24: 80 mg via INTRAVENOUS
  Filled 2017-11-24: qty 2

## 2017-11-24 NOTE — NC FL2 (Addendum)
[place] LEVEL OF CARE SCREENING TOOL     IDENTIFICATION  Patient Name: [name] Birthdate: [birth year] Sex: female Admission Date (Current Location): 11/20/2017  [facility] and [place] Number:  Herbalist and Address:  [facility],  [place] [address], [place]      Provider Number: [id number]  Attending Physician Name and Address:  Jani Gravel, MD  Relative Name and Phone Number:       Current Level of Care: Hospital Recommended Level of Care: [place] Prior Approval Number:    Date Approved/Denied:   PASRR Number:    Discharge Plan: Other (Comment)(assisted living)    Current Diagnoses: Patient Active Problem List   Diagnosis Date Noted  . Right middle lobe pneumonia ([place]) 11/20/2017  . Elective replacement indicated for cardiac pacemaker battery at end of lifespan 08/09/2017  . Chronic pulmonary aspiration 05/02/2017  . Encounter for palliative care   . Goals of care, counseling/discussion   . Shortness of breath   . Stress fracture of thoracic vertebra 03/19/2017  . Dementia 03/19/2017  . Non-traumatic compression fracture of T9 thoracic vertebra 01/02/2017  . Polymyalgia rheumatica ([place])   . Postural dizziness with near syncope   . Syncope 10/11/2016  . Postural dizziness with presyncope 10/11/2016  . Acute hypoxemic respiratory failure ([place]) 10/05/2016  . Chronic diastolic CHF (congestive heart failure) ([place]) 10/05/2016  . Smoker 10/05/2016  . Closed multiple fractures of right upper extremity with ribs with routine healing 10/05/2016  . HCAP (healthcare-associated pneumonia) 10/31/2015  . COPD (chronic obstructive pulmonary disease) ([place]) 10/31/2015  . Leukocytosis 10/31/2015  . Benign essential HTN 10/31/2015  . Anxiety state   . COPD exacerbation ([place]) 08/26/2015  . Systemic hypertension 08/26/2015  . Hypokalemia 08/26/2015  . Anticoagulant long-term use 08/26/2015  . DM (dermatomyositis)  08/26/2015  . Acute on chronic diastolic CHF (congestive heart failure), NYHA class 1 ([place])   . Sepsis ([place]) 06/07/2015  . CAP (community acquired pneumonia) 06/07/2015  . Acute CHF (congestive heart failure) ([place]) 06/07/2015  . Paroxysmal atrial fibrillation ([place]) 09/04/2014  . Pacemaker 07/13/2013  . CHB (complete heart block) ([place]) 07/13/2013  . SSS (sick sinus syndrome) ([place]) 07/13/2013  . Orthostatic hypotension 07/13/2013  . Aortic insufficiency 07/13/2013    Orientation RESPIRATION BLADDER Height & Weight     Self, Time, Situation, Place  O2(2L o2) Incontinent Weight: 149 lb 0.5 oz (67.6 kg) Height:  5\' 5"  (165.1 cm)  BEHAVIORAL SYMPTOMS/MOOD NEUROLOGICAL BOWEL NUTRITION STATUS      Continent Diet(heart healthy)  AMBULATORY STATUS COMMUNICATION OF NEEDS Skin   Limited Assist Verbally Normal                       Personal Care Assistance Level of Assistance  Bathing, Feeding, Dressing Bathing Assistance: Limited assistance Feeding assistance: Independent Dressing Assistance: Independent     Functional Limitations Info  Sight, Hearing, Speech Sight Info: Adequate Hearing Info: Adequate Speech Info: Adequate    SPECIAL CARE FACTORS FREQUENCY  PT (By licensed PT), OT (By licensed OT), Speech therapy     PT Frequency: 3x OT Frequency: 3x     Speech Therapy Frequency: 1x      Contractures Contractures Info: Not present    Additional Factors Info  Allergies, Code Status Code Status Info: DNR Allergies Info: nka           Current Medications (11/24/2017):  This is the current hospital active medication list Current  Facility-Administered Medications  Medication Dose Route Frequency Provider Last Rate Last Dose  . albuterol (PROVENTIL) (2.5 MG/3ML) 0.083% nebulizer solution 2.5 mg  2.5 mg Nebulization Q6H PRN Jani Gravel, MD      . albuterol (PROVENTIL) (2.5 MG/3ML) 0.083% nebulizer solution 2.5 mg  2.5 mg Nebulization TID Jani Gravel, MD      .  ALPRAZolam Duanne Moron) tablet 0.25 mg  0.25 mg Oral QHS PRN Mariel Aloe, MD   0.25 mg at 11/22/17 0136  . docusate sodium (COLACE) capsule 100 mg  100 mg Oral Daily PRN Mariel Aloe, MD      . enoxaparin (LOVENOX) injection 40 mg  40 mg Subcutaneous Q24H Mariel Aloe, MD   40 mg at 11/23/17 2326  . escitalopram (LEXAPRO) tablet 10 mg  10 mg Oral Daily Mariel Aloe, MD   10 mg at 11/24/17 1044  . furosemide (LASIX) tablet 20 mg  20 mg Oral Daily Mariel Aloe, MD   20 mg at 11/24/17 1044  . guaiFENesin (MUCINEX) 12 hr tablet 1,200 mg  1,200 mg Oral BID Irene Pap N, DO   1,200 mg at 11/24/17 1044  . guaiFENesin-dextromethorphan (ROBITUSSIN DM) 100-10 MG/5ML syrup 10 mL  10 mL Oral Q4H PRN Irene Pap N, DO      . HYDROcodone-acetaminophen (NORCO/VICODIN) 5-325 MG per tablet 1-2 tablet  1-2 tablet Oral Q4H PRN Mariel Aloe, MD      . ipratropium-albuterol (DUONEB) 0.5-2.5 (3) MG/3ML nebulizer solution 3 mL  3 mL Nebulization TID Dessa Phi, DO   3 mL at 11/24/17 0839  . ketoconazole (NIZORAL) 2 % cream   Topical BID Mariel Aloe, MD      . metoprolol succinate (TOPROL-XL) 24 hr tablet 25 mg  25 mg Oral Daily Mariel Aloe, MD   25 mg at 11/24/17 1044  . mirtazapine (REMERON) tablet 7.5 mg  7.5 mg Oral QHS Mariel Aloe, MD   7.5 mg at 11/23/17 2327  . ondansetron (ZOFRAN) tablet 4 mg  4 mg Oral Q6H PRN Mariel Aloe, MD   4 mg at 11/22/17 0140  . piperacillin-tazobactam (ZOSYN) IVPB 3.375 g  3.375 g Intravenous Q8H Hall, Carole N, DO   Stopped at 11/24/17 1136  . potassium chloride SA (K-DUR,KLOR-CON) CR tablet 20 mEq  20 mEq Oral Daily Mariel Aloe, MD   20 mEq at 11/24/17 1044  . predniSONE (DELTASONE) tablet 60 mg  60 mg Oral Q breakfast Jani Gravel, MD   60 mg at 11/24/17 0934  . sodium chloride HYPERTONIC 3 % nebulizer solution 4 mL  4 mL Nebulization BID Irene Pap N, DO   4 mL at 11/24/17 0840  . umeclidinium-vilanterol (ANORO ELLIPTA) 62.5-25 MCG/INH 1 puff  1  puff Inhalation Daily Irene Pap N, DO   1 puff at 11/24/17 0840     Discharge Medications: TAKE these medications          acetaminophen 325 MG tablet Commonly known as:  TYLENOL Take 325 mg by mouth every 6 (six) hours as needed for mild pain, moderate pain or fever.   albuterol (2.5 MG/3ML) 0.083% nebulizer solution Commonly known as:  PROVENTIL Take 3 mLs (2.5 mg total) by nebulization every 6 (six) hours as needed for wheezing or shortness of breath.   albuterol (2.5 MG/3ML) 0.083% nebulizer solution Commonly known as:  PROVENTIL Take 3 mLs (2.5 mg total) by nebulization 3 (three) times daily.   alendronate 70 MG tablet Commonly  known as:  FOSAMAX Take 70 mg by mouth every Saturday. Take with a full glass of water on an empty stomach.   ALPRAZolam 0.25 MG tablet Commonly known as:  XANAX Take 1 tablet (0.25 mg total) at bedtime as needed by mouth for sleep.   cefdinir 300 MG capsule Commonly known as:  OMNICEF Take 1 capsule (300 mg total) by mouth 2 (two) times daily for 5 days.   docusate sodium 100 MG capsule Commonly known as:  COLACE Take 1 capsule (100 mg total) daily as needed by mouth for mild constipation.   escitalopram 10 MG tablet Commonly known as:  LEXAPRO Take 10 mg by mouth daily.   furosemide 40 MG tablet Commonly known as:  LASIX Take 0.5 tablets (20 mg total) daily by mouth. What changed:    how much to take  when to take this  reasons to take this   guaiFENesin 600 MG 12 hr tablet Commonly known as:  MUCINEX Take 2 tablets (1,200 mg total) by mouth 2 (two) times daily.   guaiFENesin-dextromethorphan 100-10 MG/5ML syrup Commonly known as:  ROBITUSSIN DM Take 10 mLs by mouth every 4 (four) hours as needed for cough.   HYDROcodone-acetaminophen 5-325 MG tablet Commonly known as:  NORCO/VICODIN Take 1-2 tablets every 4 (four) hours as needed by mouth for moderate pain. What changed:    how much to take  when to take  this   metoprolol succinate 25 MG 24 hr tablet Commonly known as:  TOPROL-XL Take 25 mg by mouth daily.   midodrine 2.5 MG tablet Commonly known as:  PROAMATINE Take 2.5 mg by mouth as needed (when systolic BP is lower than 100).   mirtazapine 15 MG tablet Commonly known as:  REMERON Take 7.5 mg by mouth at bedtime.   ondansetron 4 MG tablet Commonly known as:  ZOFRAN Take 4 mg by mouth every 6 (six) hours as needed for nausea or vomiting.   potassium chloride SA 20 MEQ tablet Commonly known as:  K-DUR,KLOR-CON Take 1 tablet (20 mEq total) daily by mouth. What changed:    when to take this  reasons to take this   predniSONE 10 MG tablet Commonly known as:  DELTASONE 50mg  po qday x 2 days then 40mg  po qday x 2 days then 30mg  po qday x 2 days then 20mg  po qday x 2 days then 10mg  po qday x 2 days What changed:  additional instructions   umeclidinium-vilanterol 62.5-25 MCG/INH Aepb Commonly known as:  ANORO ELLIPTA Inhale 1 puff into the lungs daily.                                 Durable Medical Equipment  (From admission, onward)               Start     Ordered   11/24/17 1421  For home use only DME oxygen  Once    Question Answer Comment  Mode or (Route) Nasal cannula   Liters per Minute 2   Frequency Continuous (stationary and portable oxygen unit needed)   Oxygen conserving device Yes   Oxygen delivery system Gas      11/24/17 1420       Relevant Imaging Results:  Relevant Lab Results:   Additional Information SSN [id number]. Needs Home Health PT/OT/RN/ST  Nila Nephew, LCSW

## 2017-11-24 NOTE — Discharge Summary (Addendum)
[name], is a 84 y.o. female  DOB [birth year]  MRN [id number].  Admission date:  11/20/2017  Admitting Physician  Mariel Aloe, MD  Discharge Date:  11/24/2017   Primary MD  Lajean Manes, MD  Recommendations for primary care physician for things to follow:      HCAPwith +Rhinovirus  -RML infiltrate -MRSA negative,vancomycindiscontinued. Completed 5 days of zosyn iv Omnicef 300mg  po bid x 5 days Please follow up with pcp in 1-2 week, will need CXR to ensure resolution of infiltrate in 4 week  Acute hypoxemic respiratory failure -Secondary to above, and Copd  -pox on RA 87% on 6/6, pt will qualify for home o2  Paroxysmal A. fib not on anticoagulation -Continue Toprol-XL  Chronic depression/anxiety -Continue Lexapro, mirtazapine, as needed Xanax nightly  Chronic systolic CHF with LVEF 45 to 50% -Last 2D echo done on 10/11/2016 revealed LVEF 45 to 50% with hypokinesis of the apical myocardium and a trivial pericardial effusion identified. -Without decompensation currently.Continue home Lasix Check cmp in 2-4 weeks  Chronic normocytic anemia,iron deficiency anemia Check cbc in 4 weeks Check ferritin, iron, tibc in 4 weeks  Low TSH ? Due to prednisone vs subclinical hyperthyroidism -Check free T4=> wnl  PCP to check TSH in 2-4 weeks   Consultants:  Palliative care       Admission Diagnosis  Hypoxia [R09.02] Community acquired pneumonia of right middle lobe of lung ([place]) [J18.1]   Discharge Diagnosis  Hypoxia [R09.02] Community acquired pneumonia of right middle lobe of lung ([place]) [J18.1]     Principal Problem:   HCAP (healthcare-associated pneumonia) Active Problems:   Pacemaker   CHB (complete heart block) (HCC)   SSS (sick sinus syndrome) (HCC)   Paroxysmal atrial fibrillation (HCC)   COPD (chronic obstructive pulmonary disease) (HCC)   Benign  essential HTN   Acute hypoxemic respiratory failure (HCC)   Chronic diastolic CHF (congestive heart failure) (HCC)   Right middle lobe pneumonia ([place])      Past Medical History:  Diagnosis Date  . CHB (complete heart block) ([place]) 07/13/2013   Pacemaker dependent  . CHF (congestive heart failure) ([place])   . COPD (chronic obstructive pulmonary disease) ([place])   . DM (dermatomyositis)   . Orthostatic hypotension   . Pacemaker 07/13/2013   Her original pacemaker and the current leads were implanted in 1992. She has had 2 generator change out, most recently in 2010. Her device is a Buyer, retail 2110 non RF dual-chamber pacemaker with a battery longevity estimated at about 7 years. The atrial lead is a St. Jude [id number] and the ventricular lead was a Biotronik [id number].   [name] Paroxysmal atrial fibrillation ([place]) 09/04/2014   on Eliquis  . Polymyalgia rheumatica ([place])   . Scoliosis   . SSS (sick sinus syndrome) ([place]) 07/13/2013  . Syncope   . Systemic hypertension   . Thoracic kyphosis     Past Surgical History:  Procedure Laterality Date  . IR KYPHO THORACIC WITH BONE BIOPSY  01/04/2017  . IR RADIOLOGIST EVAL & MGMT  01/10/2017  . NM MYOCAR PERF WALL MOTION  09/13/2011   Low risk  . PERMANENT PACEMAKER GENERATOR CHANGE  03/27/2009   St.Jude  . PPM GENERATOR CHANGEOUT N/A 08/09/2017   Procedure: PPM GENERATOR CHANGEOUT;  Surgeon: Sanda Klein, MD;  Location: [facility];  Service: Cardiovascular;  Laterality: N/A;  . US ECHOCARDIOGRAPHY  03/28/2012   Mod LAE,mild MR,aortic sclerosis w/mod AI,mod. TR,mild PI,Stage I diastolic dysfunction  . VIDEO BRONCHOSCOPY Bilateral 10/07/2016   Procedure: VIDEO BRONCHOSCOPY WITH FLUORO;  Surgeon: Marshell Garfinkel, MD;  Location: [facility];  Service: Cardiopulmonary;  Laterality: Bilateral;       HPI  from the history and physical done on the day of admission:     84 y.o. female with medical history significant of sick sinus syndrome and complete  heart block status post pacemaker, paroxysmal atrial fibrillation, COPD, hypertension, recurrent pneumonia, dementia, anxiety.  Unable to  obtain complete history secondary to patient's underlying dementia.  Patient states that she woke up today feeling very short of breath.  She states that she has been having increased shortness of breath over the last 3 days with coughing and sputum production that is clear.  She does not report any wheezing.  ED Course: Vitals: Temperature of 99.1 F, pulse of 70, respirations in low 20s, blood pressure is hypertensive in 140s to 170s/60s to 70s, on supplemental oxygen Labs: Glucose of 112, BNP of 706.9, troponin of 0.04, white blood cell count of 10.8k, hemoglobin of 10.5 Imaging: CT chest significant for right middle lobe collapse versus consolidation.  Per report, unable to exclude obstructing lesion.  Also significant for bilateral pleural effusions Medications/Course: Ceftriaxone, azithromycin, albuterol given in the emergency department      Hospital Course:     Pt was admitted to the hospital for Hcap, CXR reviewed RML infiltrate, CT chest IMPRESSION: 1. New collapse/consolidation in the right middle lobe. Difficult to exclude a centrally obstructing lesion. 2. Mild septal thickening with bilateral pleural effusions, indicative of congestive heart failure. 3. Aortic atherosclerosis (ICD10-170.0). Coronary artery calcification. 4. Enlarged pulmonary arteries, indicative of pulmonary arterial hypertension.  Viral resp panel   + rhinovirus.   Pt started on vanco, and zosyn iv, vanco was discontinued after rhinovirus + returned on 6/5.  Pt was started on iron supplementation for iron deficiency anemia.  Her TSH was noted to be slightly low 0.028,  Free T4 1.09 (normal).  This might be due to recent steroid use.  Pt breathing improved.  Her pox was low on room air 87%, at rest.  Pt feels better today. Pt declines SNF for rehab,  Pt would like to return  to ALF.        Follow UP  Follow-up Information    Stoneking, Hal, MD Follow up in 1 week(s).   Specialty:  Internal Medicine Contact information: [address] [place] [address] [phone]            Consults obtained - none  Discharge Condition:  stable  Diet and Activity recommendation: See Discharge Instructions below  Discharge Instructions         Discharge Medications     Allergies as of 11/24/2017   No Known Allergies     Medication List    STOP taking these medications   ipratropium-albuterol 0.5-2.5 (3) MG/3ML Soln Commonly known as:  DUONEB     TAKE these medications   acetaminophen 325 MG tablet  Commonly known as:  TYLENOL Take 325 mg by mouth every 6 (six) hours as needed for mild pain, moderate pain or fever.   albuterol (2.5 MG/3ML) 0.083% nebulizer solution Commonly known as:  PROVENTIL Take 3 mLs (2.5 mg total) by nebulization every 6 (six) hours as needed for wheezing or shortness of breath.   albuterol (2.5 MG/3ML) 0.083% nebulizer solution Commonly known as:  PROVENTIL Take 3 mLs (2.5 mg total) by nebulization 3 (three) times daily.   alendronate 70 MG tablet Commonly known as:  FOSAMAX Take 70 mg by mouth every Saturday. Take with a full glass of water on an empty stomach.   ALPRAZolam 0.25 MG tablet Commonly known as:  XANAX Take 1 tablet (0.25 mg total) at bedtime as needed by mouth for sleep.   cefdinir 300 MG capsule Commonly known as:  OMNICEF Take 1 capsule (300 mg total) by mouth 2 (two) times daily for 5 days.   docusate sodium 100 MG capsule Commonly known as:  COLACE Take 1 capsule (100 mg total) daily as needed by mouth for mild constipation.   escitalopram 10 MG tablet Commonly known as:  LEXAPRO Take 10 mg by mouth daily.   furosemide 40 MG tablet Commonly known as:  LASIX Take 0.5 tablets (20 mg total) daily by mouth. What changed:    how much to take  when to take  this  reasons to take this   guaiFENesin 600 MG 12 hr tablet Commonly known as:  MUCINEX Take 2 tablets (1,200 mg total) by mouth 2 (two) times daily.   guaiFENesin-dextromethorphan 100-10 MG/5ML syrup Commonly known as:  ROBITUSSIN DM Take 10 mLs by mouth every 4 (four) hours as needed for cough.   HYDROcodone-acetaminophen 5-325 MG tablet Commonly known as:  NORCO/VICODIN Take 1-2 tablets every 4 (four) hours as needed by mouth for moderate pain. What changed:    how much to take  when to take this   metoprolol succinate 25 MG 24 hr tablet Commonly known as:  TOPROL-XL Take 25 mg by mouth daily.   midodrine 2.5 MG tablet Commonly known as:  PROAMATINE Take 2.5 mg by mouth as needed (when systolic BP is lower than 100).   mirtazapine 15 MG tablet Commonly known as:  REMERON Take 7.5 mg by mouth at bedtime.   ondansetron 4 MG tablet Commonly known as:  ZOFRAN Take 4 mg by mouth every 6 (six) hours as needed for nausea or vomiting.   potassium chloride SA 20 MEQ tablet Commonly known as:  K-DUR,KLOR-CON Take 1 tablet (20 mEq total) daily by mouth. What changed:    when to take this  reasons to take this   predniSONE 10 MG tablet Commonly known as:  DELTASONE 50mg  po qday x 2 days then 40mg  po qday x 2 days then 30mg  po qday x 2 days then 20mg  po qday x 2 days then 10mg  po qday x 2 days What changed:  additional instructions   umeclidinium-vilanterol 62.5-25 MCG/INH Aepb Commonly known as:  ANORO ELLIPTA Inhale 1 puff into the lungs daily.            Durable Medical Equipment  (From admission, onward)        Start     Ordered   11/24/17 1421  For home use only DME oxygen  Once    Question Answer Comment  Mode or (Route) Nasal cannula   Liters per Minute 2   Frequency Continuous (stationary and portable oxygen unit needed)  Oxygen conserving device Yes   Oxygen delivery system Gas      11/24/17 1420      Major procedures and Radiology  Reports - PLEASE review detailed and final reports for all details, in brief -       Dg Chest 2 View  Result Date: 11/20/2017 CLINICAL DATA:  Cough EXAM: CHEST - 2 VIEW COMPARISON:  Chest radiograph April 21, 2017 and chest CT April 22, 2017 FINDINGS: There is a small pleural effusion on the left. There is atelectatic change in the lung bases. There is no frank consolidation. Heart is upper normal in size with pulmonary vascularity normal. No adenopathy. Pacemaker leads are attached the right atrium and right ventricle. Patient is undergone kyphoplasty procedure in the lower thoracic region. IMPRESSION: Small left pleural effusion with patchy atelectasis in both lower lobes. No frank consolidation. Heart upper normal in size. There is aortic atherosclerosis. Pacemaker leads attached right atrium and right ventricle. Aortic Atherosclerosis (ICD10-I70.0). Electronically Signed   By: Lowella Grip III M.D.   On: 11/20/2017 11:00   Ct Chest Wo Contrast  Result Date: 11/20/2017 CLINICAL DATA:  Worsening shortness of breath.  Cough, COPD. EXAM: CT CHEST WITHOUT CONTRAST TECHNIQUE: Multidetector CT imaging of the chest was performed following the standard protocol without IV contrast. COMPARISON:  04/22/2017. FINDINGS: Cardiovascular: Atherosclerotic calcification of the arterial vasculature, including three-vessel involvement of the coronary arteries. Pulmonary arteries and heart are enlarged. No pericardial effusion. Mediastinum/Nodes: Mediastinal lymph nodes are not enlarged by CT size criteria. Hilar regions are difficult to evaluate without IV contrast. No axillary adenopathy. Esophagus is grossly unremarkable. Lungs/Pleura: Mild centrilobular emphysema. Right middle lobe collapse/consolidation. Mild smooth septal thickening with small bilateral pleural effusions. Compressive atelectasis in both lower lobes. There may be post infectious/postinflammatory scarring in the peripheral right lower lobe.  Mild peribronchial thickening. Upper Abdomen: Numerous low-attenuation lesions are again seen in the liver, measuring up to 3.4 cm, similar. Visualized portions of the adrenal glands, kidneys, spleen and stomach are grossly unremarkable. No upper abdominal adenopathy. Musculoskeletal: Degenerative changes in the spine. T8 compression fracture and T9 vertebral body augmentation are unchanged. No worrisome lytic or sclerotic lesions. IMPRESSION: 1. New collapse/consolidation in the right middle lobe. Difficult to exclude a centrally obstructing lesion. 2. Mild septal thickening with bilateral pleural effusions, indicative of congestive heart failure. 3. Aortic atherosclerosis (ICD10-170.0). Coronary artery calcification. 4. Enlarged pulmonary arteries, indicative of pulmonary arterial hypertension. Electronically Signed   By: Lorin Picket M.D.   On: 11/20/2017 14:17    Micro Results      Recent Results (from the past 240 hour(s))  MRSA PCR Screening     Status: None   Collection Time: 11/20/17  5:08 PM  Result Value Ref Range Status   MRSA by PCR NEGATIVE NEGATIVE Final    Comment:        The GeneXpert MRSA Assay (FDA approved for NASAL specimens only), is one component of a comprehensive MRSA colonization surveillance program. It is not intended to diagnose MRSA infection nor to guide or monitor treatment for MRSA infections. Performed at [facility], [place] [address], [place], [place] [address]   Respiratory Panel by PCR     Status: Abnormal   Collection Time: 11/20/17  5:26 PM  Result Value Ref Range Status   Adenovirus NOT DETECTED NOT DETECTED Final   Coronavirus 229E NOT DETECTED NOT DETECTED Final   Coronavirus HKU1 NOT DETECTED NOT DETECTED Final   Coronavirus NL63 NOT DETECTED NOT  DETECTED Final   Coronavirus OC43 NOT DETECTED NOT DETECTED Final   Metapneumovirus NOT DETECTED NOT DETECTED Final   Rhinovirus / Enterovirus DETECTED (A) NOT DETECTED Final    Influenza A NOT DETECTED NOT DETECTED Final   Influenza B NOT DETECTED NOT DETECTED Final   Parainfluenza Virus 1 NOT DETECTED NOT DETECTED Final   Parainfluenza Virus 2 NOT DETECTED NOT DETECTED Final   Parainfluenza Virus 3 NOT DETECTED NOT DETECTED Final   Parainfluenza Virus 4 NOT DETECTED NOT DETECTED Final   Respiratory Syncytial Virus NOT DETECTED NOT DETECTED Final   Bordetella pertussis NOT DETECTED NOT DETECTED Final   Chlamydophila pneumoniae NOT DETECTED NOT DETECTED Final   Mycoplasma pneumoniae NOT DETECTED NOT DETECTED Final    Comment: Performed at [facility], [place] [address] [place], [place] [address]  Culture, blood (routine x 2) Call MD if unable to obtain prior to antibiotics being given     Status: None (Preliminary result)   Collection Time: 11/20/17  5:33 PM  Result Value Ref Range Status   Specimen Description   Final    BLOOD RIGHT HAND Performed at [facility] [address], [place], [place] [address]    Special Requests   Final    BOTTLES DRAWN AEROBIC AND ANAEROBIC Blood Culture adequate volume Performed at [facility] [address] [place], [place] [address]    Culture   Final    NO GROWTH 4 DAYS Performed at [facility], [place] [address], [place], [place] [address]    Report Status PENDING  Incomplete  Culture, blood (routine x 2) Call MD if unable to obtain prior to antibiotics being given     Status: None (Preliminary result)   Collection Time: 11/20/17  5:46 PM  Result Value Ref Range Status   Specimen Description   Final    BLOOD LEFT HAND Performed at [facility] [address], [place], [place] [address]    Special Requests   Final    BOTTLES DRAWN AEROBIC AND ANAEROBIC Blood Culture adequate volume Performed at [facility] [address], [place], [place] [address]    Culture   Final    NO GROWTH 4 DAYS Performed at [facility],  [place] [address] [place], [place] [address]    Report Status PENDING  Incomplete       Today   Subjective    Kristinia Leavy today feels much better.  Her breathing has improved.  Very little cough.    Pt has no headache,no chest, no  abdominal pain,no new weakness tingling or numbness, feels much better wants to go home today.   Objective   Blood pressure (!) 141/76, pulse 74, temperature 99.6 F (37.6 C), temperature source Oral, resp. rate 16, height 5\' 5"  (1.651 m), weight 67.6 kg (149 lb 0.5 oz), SpO2 98 %.   Intake/Output Summary (Last 24 hours) at 11/24/2017 1447 Last data filed at 11/24/2017 1429 Gross per 24 hour  Intake 480 ml  Output 800 ml  Net -320 ml    Exam Awake Alert, Oriented x 3, No new F.N deficits, Normal affect [place].AT,PERRAL Supple Neck,No JVD, No cervical lymphadenopathy appriciated.  Symmetrical Chest wall movement, Good air movement bilaterally, few crackles in rml, faint exp wheezing,  RRR,No Gallops,Rubs or new Murmurs, No Parasternal Heave +ve B.Sounds, Abd Soft, Non tender, No organomegaly appriciated, No rebound -guarding or rigidity. No Cyanosis, Clubbing or edema, No new  Rash or bruise   Data Review   CBC w Diff:  Lab Results  Component Value Date   WBC 11.0 (H) 11/24/2017   HGB 11.3 (L) 11/24/2017   HCT 38.3 11/24/2017   HCT 34.3 11/22/2017   PLT 275 11/24/2017   LYMPHOPCT 17 04/21/2017   MONOPCT 8 04/21/2017   EOSPCT 0 04/21/2017   BASOPCT 0 04/21/2017    CMP:  Lab Results  Component Value Date   NA 141 11/24/2017   K 4.1 11/24/2017   CL 103 11/24/2017   CO2 32 11/24/2017   BUN 11 11/24/2017   CREATININE 0.82 11/24/2017   CREATININE 0.84 11/04/2015   PROT 6.5 11/24/2017   ALBUMIN 3.2 (L) 11/24/2017   BILITOT 1.2 11/24/2017   ALKPHOS 59 11/24/2017   AST 17 11/24/2017   ALT 11 (L) 11/24/2017  .   Total Time in preparing paper work, data evaluation and todays exam - 59 minutes  Jani Gravel M.D on 11/24/2017 at 2:47 PM  [facility]   Office  [phone]

## 2017-11-24 NOTE — Progress Notes (Signed)
Per MD, pt to discharge on home 02. [facility] alerted of need for 02. Daughter to transport pt back to ALF. [facility] to provide travel tanks for transport. Marney Doctor RN,BSN,NCM [phone] Marney Doctor RN,BSN,NCM [phone]

## 2017-11-24 NOTE — Progress Notes (Addendum)
CSW following to assist with disposition as pt is admitted from facility- [facility]. Plans to return at DC- discussed care needs at length with pt and daughter- daughter discussed with ALF administrator and all in agreement that pt's needs can be met at ALF with home health and increased support rather than pt wanting to pursue SNF.  Home Health has been arranged per CM. Will need new FL2 for facility upon DC- daughter plans to transport pt. (Pt at this point anticipate needing home O2 on DC- will need to be coordinated prior to pt's return to ALF)  Sharren Bridge, MSW, LCSW Clinical Social Work 11/24/2017 [phone]  Provided DC summary and FL2 (with PT/OT/RN/ST orders) via [facility].  Once portable O2 tank delivered, pt's daughter will transport pt to facility. Report # [phone]

## 2017-11-25 LAB — CULTURE, BLOOD (ROUTINE X 2)
Culture: NO GROWTH
Culture: NO GROWTH
Special Requests: ADEQUATE
Special Requests: ADEQUATE

## 2017-11-27 ENCOUNTER — Non-Acute Institutional Stay: Payer: Medicare Other | Admitting: Hospice and Palliative Medicine

## 2017-11-27 DIAGNOSIS — Z515 Encounter for palliative care: Secondary | ICD-10-CM

## 2017-11-27 DIAGNOSIS — J181 Lobar pneumonia, unspecified organism: Secondary | ICD-10-CM | POA: Diagnosis not present

## 2017-11-27 DIAGNOSIS — J441 Chronic obstructive pulmonary disease with (acute) exacerbation: Secondary | ICD-10-CM | POA: Diagnosis not present

## 2017-11-28 ENCOUNTER — Non-Acute Institutional Stay: Payer: Medicare Other | Admitting: Hospice and Palliative Medicine

## 2017-11-28 DIAGNOSIS — Z515 Encounter for palliative care: Secondary | ICD-10-CM

## 2017-11-28 NOTE — Progress Notes (Signed)
PALLIATIVE CARE CONSULT VISIT   PATIENT NAME: [name] DOB: 1931/04/30 MRN: [id number]  PRIMARY CARE PROVIDER: Lajean Manes, MD  REFERRING PROVIDER: Lajean Manes, MD [address] [place], [place] [address]  RESPONSIBLE PARTY:   Daughter   RECOMMENDATIONS and PLAN:  1.Shortness of breath/hypoxia: oxygen concentrator only going to 2 liters and am unable to get oxygen sats > 85%. HHC needs to bring appropriate concentrator. Facility is locating some portable oxygen tanks until proper concentrator can be delivered. Have given breathing treatment; Recommend seeing PCP, going back to the hospital, SNF setting for several weeks or Hospice. Patient is unable to maintain sats in high 70's to mid 80's without intervention. Needs more aggressive therapy. Oxygen exchange is very poor. Abx treatment completes tomorrow. She will need close monitoring. Completed pred taper and continue nebs and mucinex. Hospital bed is recommended for enhanced breathing while in bed.  2. ACP: discussed plan with daughter and Dr. Felipa Eth. Patient is being worked in to see Dr. Felipa Eth this afternoon. She remains DNR status at this time. Hospice is not the goal at this time. Aggresive PT/OT is desired and remaining in current AL, familiar setting is desired.  I spent 60 minutes providing this consultation,  from 11 to 12. More than 50% of the time in this consultation was spent assessing patient, interviewing staff, daughter and coordinating communication.   HISTORY OF PRESENT ILLNESS:  [name] is an 84 y.o. female with multiple medical problems including Afib, CHF and HCpna post hospital x 3 days. Palliative Care was asked to help address symptom management and goals of care.   CODE STATUS: DNR  PPS: 30% HOSPICE ELIGIBILITY/DIAGNOSIS: TBD  PAST MEDICAL HISTORY:  Past Medical History:  Diagnosis Date  . CHB (complete heart block) ([place]) 07/13/2013   Pacemaker dependent  . CHF  (congestive heart failure) ([place])   . COPD (chronic obstructive pulmonary disease) ([place])   . DM (dermatomyositis)   . Orthostatic hypotension   . Pacemaker 07/13/2013   Her original pacemaker and the current leads were implanted in 1992. She has had 2 generator change out, most recently in 2010. Her device is a Buyer, retail 2110 non RF dual-chamber pacemaker with a battery longevity estimated at about 7 years. The atrial lead is a St. Jude [id number] and the ventricular lead was a Biotronik [id number].   [name] Paroxysmal atrial fibrillation ([place]) 09/04/2014   on Eliquis  . Polymyalgia rheumatica ([place])   . Scoliosis   . SSS (sick sinus syndrome) ([place]) 07/13/2013  . Syncope   . Systemic hypertension   . Thoracic kyphosis     SOCIAL HX:  Social History   Tobacco Use  . Smoking status: Former Smoker    Packs/day: 0.50    Years: 60.00    Pack years: 30.00    Types: Cigarettes  . Smokeless tobacco: Never Used  . Tobacco comment: quit 12/2016  Substance Use Topics  . Alcohol use: No    ALLERGIES: No Known Allergies   PERTINENT MEDICATIONS:  Outpatient Encounter Medications as of 11/27/2017  Medication Sig  . acetaminophen (TYLENOL) 325 MG tablet Take 325 mg by mouth every 6 (six) hours as needed for mild pain, moderate pain or fever.   [name] albuterol (PROVENTIL) (2.5 MG/3ML) 0.083% nebulizer solution Take 3 mLs (2.5 mg total) by nebulization every 6 (six) hours as needed for wheezing or shortness of breath.  [name] albuterol (PROVENTIL) (2.5 MG/3ML) 0.083% nebulizer solution Take 3 mLs (  2.5 mg total) by nebulization 3 (three) times daily.  [name] alendronate (FOSAMAX) 70 MG tablet Take 70 mg by mouth every Saturday. Take with a full glass of water on an empty stomach.  . ALPRAZolam (XANAX) 0.25 MG tablet Take 1 tablet (0.25 mg total) at bedtime as needed by mouth for sleep.  . cefdinir (OMNICEF) 300 MG capsule Take 1 capsule (300 mg total) by mouth 2 (two) times daily for 5 days.  [name] docusate sodium (COLACE) 100  MG capsule Take 1 capsule (100 mg total) daily as needed by mouth for mild constipation.  [name] escitalopram (LEXAPRO) 10 MG tablet Take 10 mg by mouth daily.  . furosemide (LASIX) 40 MG tablet Take 0.5 tablets (20 mg total) daily by mouth. (Patient taking differently: Take 40 mg by mouth daily as needed (for weight gain greater than 2lbs). )  . guaiFENesin (MUCINEX) 600 MG 12 hr tablet Take 2 tablets (1,200 mg total) by mouth 2 (two) times daily.  [name] guaiFENesin-dextromethorphan (ROBITUSSIN DM) 100-10 MG/5ML syrup Take 10 mLs by mouth every 4 (four) hours as needed for cough.  [name] HYDROcodone-acetaminophen (NORCO/VICODIN) 5-325 MG tablet Take 1-2 tablets every 4 (four) hours as needed by mouth for moderate pain. (Patient taking differently: Take 1 tablet by mouth daily. )  . metoprolol succinate (TOPROL-XL) 25 MG 24 hr tablet Take 25 mg by mouth daily.   . midodrine (PROAMATINE) 2.5 MG tablet Take 2.5 mg by mouth as needed (when systolic BP is lower than 100).   . mirtazapine (REMERON) 15 MG tablet Take 7.5 mg by mouth at bedtime.   . ondansetron (ZOFRAN) 4 MG tablet Take 4 mg by mouth every 6 (six) hours as needed for nausea or vomiting.  . potassium chloride SA (K-DUR,KLOR-CON) 20 MEQ tablet Take 1 tablet (20 mEq total) daily by mouth. (Patient taking differently: Take 20 mEq by mouth daily as needed (with Lasix). )  . predniSONE (DELTASONE) 10 MG tablet 50mg  po qday x 2 days then 40mg  po qday x 2 days then 30mg  po qday x 2 days then 20mg  po qday x 2 days then 10mg  po qday x 2 days  . umeclidinium-vilanterol (ANORO ELLIPTA) 62.5-25 MCG/INH AEPB Inhale 1 puff into the lungs daily.   No facility-administered encounter medications on file as of 11/27/2017.     PHYSICAL EXAM:  Oxygen sats from 78% to 84% on 2 to 3 liters R= 26 General: Acutely and chronically ill Cardiovascular: irreg rhythm; reg rate Pulmonary: no oxygen exchange in RUL/RML; wheezing; sats 82 to 85% Abdomen: soft, active BS,  NTTP Extremities: too weak to walk independently; trace edema in lower legs Skin: thin, fragile; multiple bruising on arms Neurological: confused; +generalized weakness  Nathanial Rancher, NP

## 2017-11-29 NOTE — Progress Notes (Signed)
PALLIATIVE CARE CONSULT VISIT   PATIENT NAME: [name] DOB: [birth year] MRN: [id number]  PRIMARY CARE PROVIDER: Lajean Manes, MD  REFERRING PROVIDER: Lajean Manes, MD [address] [place],  [address]  RESPONSIBLE PARTY:   Daughter, [name]   RECOMMENDATIONS and PLAN:  1.Hypoxia/secondary to pna: resident is now with oxygen concentrator requiring 3 liters to maintain oxygen at 90 to 91%. She appears much better. Hospital bed has been ordered to help her sleep with some elevation. She saw her PCP, Dr. Felipa Eth yesterday afternoon and apparently had oxygen sats in the mid 90's on 3 liters of oxygen. She is to continue on oxygen, prednisone taper, mucinex and duonebs scheduled TID and remain in AL setting. Uncertain if she will be able to completely come off oxygen therapy? Only time and therapy will tell. Encourage use of IS.  2.Weakness: post hospitalization for pna. HHC PT/OT will be seeing her this week. Staff reports that she was able to walk to her recliner in her room today. This is a big improvement from remaining in the bed to [facility] only. Continue to monitor from palliative care perspective. 3. ACP: patient does not want to go to hospice or SNF setting. Dr. Felipa Eth does not think patient appropriate for hospice services at this time. She will remain with palliative care and [place] therapy. She remains DNR status. Patient prefers not to to go back to the hospital.   I spent 25 minutes providing this consultation,  from 10:30 to 10:55. More than 50% of the time in this consultation was spent assessing patient, interviewing staff and coordinating communication.   HISTORY OF PRESENT ILLNESS:  [name] is a 84 y.o. female with multiple medical problems including Afib, CHF, COPD, recent pna as well as others.  Today's visit is FU visit from yesterday due to hypoxia secondary to pna.    CODE STATUS: DNR  PPS: weak 40% HOSPICE ELIGIBILITY/DIAGNOSIS:  TBD  PAST MEDICAL HISTORY:  Past Medical History:  Diagnosis Date  . CHB (complete heart block) ([place]) 07/13/2013   Pacemaker dependent  . CHF (congestive heart failure) ([place])   . COPD (chronic obstructive pulmonary disease) ([place])   . DM (dermatomyositis)   . Orthostatic hypotension   . Pacemaker 07/13/2013   Her original pacemaker and the current leads were implanted in 1992. She has had 2 generator change out, most recently in 2010. Her device is a Buyer, retail 2110 non RF dual-chamber pacemaker with a battery longevity estimated at about 7 years. The atrial lead is a St. Jude [id number] and the ventricular lead was a Biotronik [id number].   [name] Paroxysmal atrial fibrillation ([place]) 09/04/2014   on Eliquis  . Polymyalgia rheumatica ([place])   . Scoliosis   . SSS (sick sinus syndrome) ([place]) 07/13/2013  . Syncope   . Systemic hypertension   . Thoracic kyphosis     SOCIAL HX:  Social History   Tobacco Use  . Smoking status: Former Smoker    Packs/day: 0.50    Years: 60.00    Pack years: 30.00    Types: Cigarettes  . Smokeless tobacco: Never Used  . Tobacco comment: quit 12/2016  Substance Use Topics  . Alcohol use: No    ALLERGIES: No Known Allergies   PERTINENT MEDICATIONS:  Outpatient Encounter Medications as of 11/28/2017  Medication Sig  . acetaminophen (TYLENOL) 325 MG tablet Take 325 mg by mouth every 6 (six) hours as needed for mild pain, moderate pain or  fever.   . albuterol (PROVENTIL) (2.5 MG/3ML) 0.083% nebulizer solution Take 3 mLs (2.5 mg total) by nebulization every 6 (six) hours as needed for wheezing or shortness of breath.  [name] albuterol (PROVENTIL) (2.5 MG/3ML) 0.083% nebulizer solution Take 3 mLs (2.5 mg total) by nebulization 3 (three) times daily.  [name] alendronate (FOSAMAX) 70 MG tablet Take 70 mg by mouth every Saturday. Take with a full glass of water on an empty stomach.  . ALPRAZolam (XANAX) 0.25 MG tablet Take 1 tablet (0.25 mg total) at bedtime as needed by mouth for  sleep.  . cefdinir (OMNICEF) 300 MG capsule Take 1 capsule (300 mg total) by mouth 2 (two) times daily for 5 days.  [name] docusate sodium (COLACE) 100 MG capsule Take 1 capsule (100 mg total) daily as needed by mouth for mild constipation.  [name] escitalopram (LEXAPRO) 10 MG tablet Take 10 mg by mouth daily.  . furosemide (LASIX) 40 MG tablet Take 0.5 tablets (20 mg total) daily by mouth. (Patient taking differently: Take 40 mg by mouth daily as needed (for weight gain greater than 2lbs). )  . guaiFENesin (MUCINEX) 600 MG 12 hr tablet Take 2 tablets (1,200 mg total) by mouth 2 (two) times daily.  [name] guaiFENesin-dextromethorphan (ROBITUSSIN DM) 100-10 MG/5ML syrup Take 10 mLs by mouth every 4 (four) hours as needed for cough.  [name] HYDROcodone-acetaminophen (NORCO/VICODIN) 5-325 MG tablet Take 1-2 tablets every 4 (four) hours as needed by mouth for moderate pain. (Patient taking differently: Take 1 tablet by mouth daily. )  . metoprolol succinate (TOPROL-XL) 25 MG 24 hr tablet Take 25 mg by mouth daily.   . midodrine (PROAMATINE) 2.5 MG tablet Take 2.5 mg by mouth as needed (when systolic BP is lower than 100).   . mirtazapine (REMERON) 15 MG tablet Take 7.5 mg by mouth at bedtime.   . ondansetron (ZOFRAN) 4 MG tablet Take 4 mg by mouth every 6 (six) hours as needed for nausea or vomiting.  . potassium chloride SA (K-DUR,KLOR-CON) 20 MEQ tablet Take 1 tablet (20 mEq total) daily by mouth. (Patient taking differently: Take 20 mEq by mouth daily as needed (with Lasix). )  . predniSONE (DELTASONE) 10 MG tablet 50mg  po qday x 2 days then 40mg  po qday x 2 days then 30mg  po qday x 2 days then 20mg  po qday x 2 days then 10mg  po qday x 2 days  . umeclidinium-vilanterol (ANORO ELLIPTA) 62.5-25 MCG/INH AEPB Inhale 1 puff into the lungs daily.   No facility-administered encounter medications on file as of 11/28/2017.     PHYSICAL EXAM:   General: Elderly, Caucasian female lying in bed wearing oxygen in  NAD Cardiovascular: irreg rhythm; reg rate /PM Pulmonary: RML with mild wheeze. Diminished in bases. Oxygen sats 90% on 3 liters via [place] via concentrator Abdomen: soft, active BS, NTTp Extremities: weak upon transferring; Skin: scattered bruising; fragile Neurological: awake; alert; less confused today +short term memory loss; +generalized weakness  Nathanial Rancher, NP

## 2017-11-30 DIAGNOSIS — I110 Hypertensive heart disease with heart failure: Secondary | ICD-10-CM | POA: Diagnosis not present

## 2017-11-30 DIAGNOSIS — I495 Sick sinus syndrome: Secondary | ICD-10-CM | POA: Diagnosis not present

## 2017-11-30 DIAGNOSIS — I5022 Chronic systolic (congestive) heart failure: Secondary | ICD-10-CM | POA: Diagnosis not present

## 2017-11-30 DIAGNOSIS — I480 Paroxysmal atrial fibrillation: Secondary | ICD-10-CM | POA: Diagnosis not present

## 2017-11-30 DIAGNOSIS — I5033 Acute on chronic diastolic (congestive) heart failure: Secondary | ICD-10-CM | POA: Diagnosis not present

## 2017-11-30 DIAGNOSIS — J441 Chronic obstructive pulmonary disease with (acute) exacerbation: Secondary | ICD-10-CM | POA: Diagnosis not present

## 2017-12-01 DIAGNOSIS — J441 Chronic obstructive pulmonary disease with (acute) exacerbation: Secondary | ICD-10-CM | POA: Diagnosis not present

## 2017-12-01 DIAGNOSIS — I495 Sick sinus syndrome: Secondary | ICD-10-CM | POA: Diagnosis not present

## 2017-12-01 DIAGNOSIS — I110 Hypertensive heart disease with heart failure: Secondary | ICD-10-CM | POA: Diagnosis not present

## 2017-12-01 DIAGNOSIS — I480 Paroxysmal atrial fibrillation: Secondary | ICD-10-CM | POA: Diagnosis not present

## 2017-12-01 DIAGNOSIS — I5033 Acute on chronic diastolic (congestive) heart failure: Secondary | ICD-10-CM | POA: Diagnosis not present

## 2017-12-01 DIAGNOSIS — I5022 Chronic systolic (congestive) heart failure: Secondary | ICD-10-CM | POA: Diagnosis not present

## 2017-12-04 DIAGNOSIS — I110 Hypertensive heart disease with heart failure: Secondary | ICD-10-CM | POA: Diagnosis not present

## 2017-12-04 DIAGNOSIS — I480 Paroxysmal atrial fibrillation: Secondary | ICD-10-CM | POA: Diagnosis not present

## 2017-12-04 DIAGNOSIS — J441 Chronic obstructive pulmonary disease with (acute) exacerbation: Secondary | ICD-10-CM | POA: Diagnosis not present

## 2017-12-04 DIAGNOSIS — I5033 Acute on chronic diastolic (congestive) heart failure: Secondary | ICD-10-CM | POA: Diagnosis not present

## 2017-12-04 DIAGNOSIS — I5022 Chronic systolic (congestive) heart failure: Secondary | ICD-10-CM | POA: Diagnosis not present

## 2017-12-04 DIAGNOSIS — I495 Sick sinus syndrome: Secondary | ICD-10-CM | POA: Diagnosis not present

## 2017-12-05 DIAGNOSIS — I5022 Chronic systolic (congestive) heart failure: Secondary | ICD-10-CM | POA: Diagnosis not present

## 2017-12-05 DIAGNOSIS — I110 Hypertensive heart disease with heart failure: Secondary | ICD-10-CM | POA: Diagnosis not present

## 2017-12-05 DIAGNOSIS — I5033 Acute on chronic diastolic (congestive) heart failure: Secondary | ICD-10-CM | POA: Diagnosis not present

## 2017-12-05 DIAGNOSIS — I495 Sick sinus syndrome: Secondary | ICD-10-CM | POA: Diagnosis not present

## 2017-12-05 DIAGNOSIS — J441 Chronic obstructive pulmonary disease with (acute) exacerbation: Secondary | ICD-10-CM | POA: Diagnosis not present

## 2017-12-05 DIAGNOSIS — I480 Paroxysmal atrial fibrillation: Secondary | ICD-10-CM | POA: Diagnosis not present

## 2017-12-07 DIAGNOSIS — I480 Paroxysmal atrial fibrillation: Secondary | ICD-10-CM | POA: Diagnosis not present

## 2017-12-07 DIAGNOSIS — I5033 Acute on chronic diastolic (congestive) heart failure: Secondary | ICD-10-CM | POA: Diagnosis not present

## 2017-12-07 DIAGNOSIS — I495 Sick sinus syndrome: Secondary | ICD-10-CM | POA: Diagnosis not present

## 2017-12-07 DIAGNOSIS — I5022 Chronic systolic (congestive) heart failure: Secondary | ICD-10-CM | POA: Diagnosis not present

## 2017-12-07 DIAGNOSIS — J441 Chronic obstructive pulmonary disease with (acute) exacerbation: Secondary | ICD-10-CM | POA: Diagnosis not present

## 2017-12-07 DIAGNOSIS — I110 Hypertensive heart disease with heart failure: Secondary | ICD-10-CM | POA: Diagnosis not present

## 2017-12-11 DIAGNOSIS — I110 Hypertensive heart disease with heart failure: Secondary | ICD-10-CM | POA: Diagnosis not present

## 2017-12-11 DIAGNOSIS — I480 Paroxysmal atrial fibrillation: Secondary | ICD-10-CM | POA: Diagnosis not present

## 2017-12-11 DIAGNOSIS — I5022 Chronic systolic (congestive) heart failure: Secondary | ICD-10-CM | POA: Diagnosis not present

## 2017-12-11 DIAGNOSIS — J441 Chronic obstructive pulmonary disease with (acute) exacerbation: Secondary | ICD-10-CM | POA: Diagnosis not present

## 2017-12-11 DIAGNOSIS — I5033 Acute on chronic diastolic (congestive) heart failure: Secondary | ICD-10-CM | POA: Diagnosis not present

## 2017-12-11 DIAGNOSIS — I495 Sick sinus syndrome: Secondary | ICD-10-CM | POA: Diagnosis not present

## 2017-12-12 DIAGNOSIS — I480 Paroxysmal atrial fibrillation: Secondary | ICD-10-CM | POA: Diagnosis not present

## 2017-12-12 DIAGNOSIS — I495 Sick sinus syndrome: Secondary | ICD-10-CM | POA: Diagnosis not present

## 2017-12-12 DIAGNOSIS — I110 Hypertensive heart disease with heart failure: Secondary | ICD-10-CM | POA: Diagnosis not present

## 2017-12-12 DIAGNOSIS — I5033 Acute on chronic diastolic (congestive) heart failure: Secondary | ICD-10-CM | POA: Diagnosis not present

## 2017-12-12 DIAGNOSIS — I5022 Chronic systolic (congestive) heart failure: Secondary | ICD-10-CM | POA: Diagnosis not present

## 2017-12-12 DIAGNOSIS — J441 Chronic obstructive pulmonary disease with (acute) exacerbation: Secondary | ICD-10-CM | POA: Diagnosis not present

## 2017-12-13 DIAGNOSIS — I5033 Acute on chronic diastolic (congestive) heart failure: Secondary | ICD-10-CM | POA: Diagnosis not present

## 2017-12-13 DIAGNOSIS — I110 Hypertensive heart disease with heart failure: Secondary | ICD-10-CM | POA: Diagnosis not present

## 2017-12-13 DIAGNOSIS — I480 Paroxysmal atrial fibrillation: Secondary | ICD-10-CM | POA: Diagnosis not present

## 2017-12-13 DIAGNOSIS — I495 Sick sinus syndrome: Secondary | ICD-10-CM | POA: Diagnosis not present

## 2017-12-13 DIAGNOSIS — I5022 Chronic systolic (congestive) heart failure: Secondary | ICD-10-CM | POA: Diagnosis not present

## 2017-12-13 DIAGNOSIS — J441 Chronic obstructive pulmonary disease with (acute) exacerbation: Secondary | ICD-10-CM | POA: Diagnosis not present

## 2017-12-14 DIAGNOSIS — I495 Sick sinus syndrome: Secondary | ICD-10-CM | POA: Diagnosis not present

## 2017-12-14 DIAGNOSIS — I5033 Acute on chronic diastolic (congestive) heart failure: Secondary | ICD-10-CM | POA: Diagnosis not present

## 2017-12-14 DIAGNOSIS — I480 Paroxysmal atrial fibrillation: Secondary | ICD-10-CM | POA: Diagnosis not present

## 2017-12-14 DIAGNOSIS — J441 Chronic obstructive pulmonary disease with (acute) exacerbation: Secondary | ICD-10-CM | POA: Diagnosis not present

## 2017-12-14 DIAGNOSIS — I110 Hypertensive heart disease with heart failure: Secondary | ICD-10-CM | POA: Diagnosis not present

## 2017-12-14 DIAGNOSIS — I5022 Chronic systolic (congestive) heart failure: Secondary | ICD-10-CM | POA: Diagnosis not present

## 2017-12-19 DIAGNOSIS — I5033 Acute on chronic diastolic (congestive) heart failure: Secondary | ICD-10-CM | POA: Diagnosis not present

## 2017-12-19 DIAGNOSIS — I110 Hypertensive heart disease with heart failure: Secondary | ICD-10-CM | POA: Diagnosis not present

## 2017-12-19 DIAGNOSIS — I480 Paroxysmal atrial fibrillation: Secondary | ICD-10-CM | POA: Diagnosis not present

## 2017-12-19 DIAGNOSIS — I495 Sick sinus syndrome: Secondary | ICD-10-CM | POA: Diagnosis not present

## 2017-12-19 DIAGNOSIS — I5022 Chronic systolic (congestive) heart failure: Secondary | ICD-10-CM | POA: Diagnosis not present

## 2017-12-19 DIAGNOSIS — J441 Chronic obstructive pulmonary disease with (acute) exacerbation: Secondary | ICD-10-CM | POA: Diagnosis not present

## 2017-12-20 DIAGNOSIS — I495 Sick sinus syndrome: Secondary | ICD-10-CM | POA: Diagnosis not present

## 2017-12-20 DIAGNOSIS — I5033 Acute on chronic diastolic (congestive) heart failure: Secondary | ICD-10-CM | POA: Diagnosis not present

## 2017-12-20 DIAGNOSIS — J441 Chronic obstructive pulmonary disease with (acute) exacerbation: Secondary | ICD-10-CM | POA: Diagnosis not present

## 2017-12-20 DIAGNOSIS — I480 Paroxysmal atrial fibrillation: Secondary | ICD-10-CM | POA: Diagnosis not present

## 2017-12-20 DIAGNOSIS — I110 Hypertensive heart disease with heart failure: Secondary | ICD-10-CM | POA: Diagnosis not present

## 2017-12-20 DIAGNOSIS — I5022 Chronic systolic (congestive) heart failure: Secondary | ICD-10-CM | POA: Diagnosis not present

## 2017-12-21 DIAGNOSIS — I5022 Chronic systolic (congestive) heart failure: Secondary | ICD-10-CM | POA: Diagnosis not present

## 2017-12-21 DIAGNOSIS — I480 Paroxysmal atrial fibrillation: Secondary | ICD-10-CM | POA: Diagnosis not present

## 2017-12-21 DIAGNOSIS — I5033 Acute on chronic diastolic (congestive) heart failure: Secondary | ICD-10-CM | POA: Diagnosis not present

## 2017-12-21 DIAGNOSIS — I495 Sick sinus syndrome: Secondary | ICD-10-CM | POA: Diagnosis not present

## 2017-12-21 DIAGNOSIS — J441 Chronic obstructive pulmonary disease with (acute) exacerbation: Secondary | ICD-10-CM | POA: Diagnosis not present

## 2017-12-21 DIAGNOSIS — I110 Hypertensive heart disease with heart failure: Secondary | ICD-10-CM | POA: Diagnosis not present

## 2017-12-25 ENCOUNTER — Encounter: Payer: Medicare Other | Admitting: Internal Medicine

## 2017-12-25 ENCOUNTER — Telehealth: Payer: Self-pay

## 2017-12-25 NOTE — Telephone Encounter (Signed)
Phone call received from patient's daughter who reported that she has noted changes in her mom. Oxygen levels dropped into the 70's with exertion while on supplemental oxygen. Daughter requested more frequent routine visits. Patient scheduled to be seen today.

## 2017-12-26 ENCOUNTER — Other Ambulatory Visit: Payer: Self-pay | Admitting: Geriatric Medicine

## 2017-12-26 ENCOUNTER — Non-Acute Institutional Stay: Payer: Medicare Other | Admitting: Internal Medicine

## 2017-12-26 ENCOUNTER — Ambulatory Visit
Admission: RE | Admit: 2017-12-26 | Discharge: 2017-12-26 | Disposition: A | Discharge status: Home / Self Care | Payer: Medicare Other | Source: Ambulatory Visit | Attending: Geriatric Medicine | Admitting: Geriatric Medicine

## 2017-12-26 VITALS — BP 94/50 | HR 74 | Resp 16 | Wt 139.9 lb

## 2017-12-26 DIAGNOSIS — J90 Pleural effusion, not elsewhere classified: Secondary | ICD-10-CM | POA: Diagnosis not present

## 2017-12-26 DIAGNOSIS — I5033 Acute on chronic diastolic (congestive) heart failure: Secondary | ICD-10-CM | POA: Diagnosis not present

## 2017-12-26 DIAGNOSIS — J189 Pneumonia, unspecified organism: Secondary | ICD-10-CM

## 2017-12-26 DIAGNOSIS — I480 Paroxysmal atrial fibrillation: Secondary | ICD-10-CM | POA: Diagnosis not present

## 2017-12-26 DIAGNOSIS — I495 Sick sinus syndrome: Secondary | ICD-10-CM | POA: Diagnosis not present

## 2017-12-26 DIAGNOSIS — I110 Hypertensive heart disease with heart failure: Secondary | ICD-10-CM | POA: Diagnosis not present

## 2017-12-26 DIAGNOSIS — J181 Lobar pneumonia, unspecified organism: Principal | ICD-10-CM

## 2017-12-26 DIAGNOSIS — R0602 Shortness of breath: Secondary | ICD-10-CM

## 2017-12-26 DIAGNOSIS — J441 Chronic obstructive pulmonary disease with (acute) exacerbation: Secondary | ICD-10-CM | POA: Diagnosis not present

## 2017-12-26 DIAGNOSIS — Z515 Encounter for palliative care: Secondary | ICD-10-CM

## 2017-12-26 DIAGNOSIS — I5022 Chronic systolic (congestive) heart failure: Secondary | ICD-10-CM | POA: Diagnosis not present

## 2017-12-26 DIAGNOSIS — R531 Weakness: Secondary | ICD-10-CM

## 2017-12-27 ENCOUNTER — Encounter: Payer: Self-pay | Admitting: Internal Medicine

## 2017-12-27 DIAGNOSIS — I5033 Acute on chronic diastolic (congestive) heart failure: Secondary | ICD-10-CM | POA: Diagnosis not present

## 2017-12-27 DIAGNOSIS — I480 Paroxysmal atrial fibrillation: Secondary | ICD-10-CM | POA: Diagnosis not present

## 2017-12-27 DIAGNOSIS — I5022 Chronic systolic (congestive) heart failure: Secondary | ICD-10-CM | POA: Diagnosis not present

## 2017-12-27 DIAGNOSIS — Z515 Encounter for palliative care: Secondary | ICD-10-CM | POA: Diagnosis not present

## 2017-12-27 DIAGNOSIS — J441 Chronic obstructive pulmonary disease with (acute) exacerbation: Secondary | ICD-10-CM | POA: Diagnosis not present

## 2017-12-27 DIAGNOSIS — I110 Hypertensive heart disease with heart failure: Secondary | ICD-10-CM | POA: Diagnosis not present

## 2017-12-27 DIAGNOSIS — I495 Sick sinus syndrome: Secondary | ICD-10-CM | POA: Diagnosis not present

## 2017-12-27 NOTE — Progress Notes (Signed)
12/22/2017 FOLLOW UP PALLIATIVE CARE   PATIENT NAME: [name] room [place] DOB: June 30, 1930 MRN: [id number]   PRIMARY CARE PROVIDER: Lajean Manes, MD, [name] [place], [place] [address]   RESPONSIBLE PARTY:   Daughter, [name]   RECOMMENDATIONS and PLAN:  1.Hypoxia/secondary to pna/possible element CHF: -desaturates to high 80's (2L O2) and dyspneic with ambulating back and forth to restroom;Sats improve with rest to mid 90's.  Hospital bed in place, but patient states able to sleep flat laying. I reminded her that [facility] can be elevated to help her breathing if she needs. She continues O2 at 2L, Ell[place]a INH, Mucinex, Albuterol Nebs, prednisone 10 mg qd, and Mucinex. Occasional cough productive of clear secretions.   2.Weakness: (post hospitalization for pna June 2019).  -Working with [facility] twice a week; last PT visit ambulated 400 ft with a few rest stops. Improved from just being able to transfer from bed to recliner 1 month earlier. OT continues to follow.  3. ACP: Confirmed patient's wishes for DNR. She would wish to avoid hospitalizations. Not currently a hospice candidate. Continues with palliative care and [place] therapy.   4. Notes some under right anterior side pain; non pleuritic; over last few weeks; no exacerbating/relieving factors. PRN Tylenol with improvement  5. Decreased appetite: Over the last few weeks will be initially hungry but when food arrives has no appetite. Eats about 25-50% of meals. Weight decreased to 139.9 lbs (down 4.1 lbs over last week) but in setting of increase of Lasix 40mg  (from 20mg ) qd  for initial weight gain / LE edema. Continues Remeron 0.5mg  qd for appetite enhancement.   6. H/0 orthostatic HTN: Prn Midodrine; receiving about twice a week for SB <100.  I spent 25 minutes providing this consultation,  from 11:30 to 11:55. More than 50% of the time in this consultation was spent assessing patient, interviewing staff  and coordinating communication.    HISTORY OF PRESENT ILLNESS:  [name] is a 84 y.o. female resident of [facility] with Afib, CHF, COPD, and recent pna. [name] called our office, requesting NP to check in on patient for decreased appetite, and to assess progress of patient recovery form bout of pneumonia a month or so ago.   CODE STATUS: DNR   PPS: weak 40% HOSPICE ELIGIBILITY/DIAGNOSIS: TBD   PAST MEDICAL HISTORY:      Past Medical History:  Diagnosis Date  . CHB (complete heart block) ([place]) 07/13/2013    Pacemaker dependent  . CHF (congestive heart failure) ([place])    . COPD (chronic obstructive pulmonary disease) ([place])    . DM (dermatomyositis)    . Orthostatic hypotension    . Pacemaker 07/13/2013    Her original pacemaker and the current leads were implanted in 1992. She has had 2 generator change out, most recently in 2010. Her device is a Buyer, retail 2110 non RF dual-chamber pacemaker with a battery longevity estimated at about 7 years. The atrial lead is a St. Jude 0300 and the ventricular lead was a Biotronik [id number].   [name] Paroxysmal atrial fibrillation ([place]) 09/04/2014    on Eliquis  . Polymyalgia rheumatica ([place])    . Scoliosis    . SSS (sick sinus syndrome) ([place]) 07/13/2013  . Syncope    . Systemic hypertension    . Thoracic kyphosis        ALLERGIES: No Known Allergies   PERTINENT MEDICATIONS:      Outpatient Encounter Medications  as of 11/28/2017  Medication Sig  . acetaminophen (TYLENOL) 325 MG tablet Take 325 mg by mouth every 6 (six) hours as needed for mild pain, moderate pain or fever.   [name] albuterol (PROVENTIL) (2.5 MG/3ML) 0.083% nebulizer solution Take 3 mLs (2.5 mg total) by nebulization every 6 (six) hours as needed for wheezing or shortness of breath.  [name] albuterol (PROVENTIL) (2.5 MG/3ML) 0.083% nebulizer solution Take 3 mLs (2.5 mg total) by nebulization 3 (three) times daily.  [name] alendronate (FOSAMAX) 70 MG tablet Take 70 mg by mouth every  Saturday. Take with a full glass of water on an empty stomach.  . ALPRAZolam (XANAX) 0.25 MG tablet Take 1 tablet (0.25 mg total) at bedtime as needed by mouth for sleep.  . cefdinir (OMNICEF) 300 MG capsule Take 1 capsule (300 mg total) by mouth 2 (two) times daily for 5 days.  [name] docusate sodium (COLACE) 100 MG capsule Take 1 capsule (100 mg total) daily as needed by mouth for mild constipation.  [name] escitalopram (LEXAPRO) 10 MG tablet Take 10 mg by mouth daily.  . furosemide (LASIX) 40 MG tablet Take 0.5 tablets (20 mg total) daily by mouth. (Patient taking differently: Take 40 mg by mouth daily as needed (for weight gain greater than 2lbs). )  . guaiFENesin (MUCINEX) 600 MG 12 hr tablet Take 2 tablets (1,200 mg total) by mouth 2 (two) times daily.  [name] guaiFENesin-dextromethorphan (ROBITUSSIN DM) 100-10 MG/5ML syrup Take 10 mLs by mouth every 4 (four) hours as needed for cough.  [name] HYDROcodone-acetaminophen (NORCO/VICODIN) 5-325 MG tablet Take 1-2 tablets every 4 (four) hours as needed by mouth for moderate pain. (Patient taking differently: Take 1 tablet by mouth daily. )  . metoprolol succinate (TOPROL-XL) 25 MG 24 hr tablet Take 25 mg by mouth daily.   . midodrine (PROAMATINE) 2.5 MG tablet Take 2.5 mg by mouth as needed (when systolic BP is lower than 100).   . mirtazapine (REMERON) 15 MG tablet Take 7.5 mg by mouth at bedtime.   . ondansetron (ZOFRAN) 4 MG tablet Take 4 mg by mouth every 6 (six) hours as needed for nausea or vomiting.  . potassium chloride SA (K-DUR,KLOR-CON) 20 MEQ tablet Take 1 tablet (20 mEq total) daily by mouth. (Patient taking differently: Take 20 mEq by mouth daily as needed (with Lasix). )  . predniSONE (DELTASONE) 10 MG tablet 50mg  po qday x 2 days then 40mg  po qday x 2 days then 30mg  po qday x 2 days then 20mg  po qday x 2 days then 10mg  po qday x 2 days  . umeclidinium-vilanterol (ANORO ELLIPTA) 62.5-25 MCG/INH AEPB Inhale 1 puff into the lungs daily.       PHYSICAL  EXAM:  VS: BP 94 systolic, HR 74, Sats 09% on 2.L RR16   General: Elderly, Caucasian female lying in bed wearing oxygen in NAD. Alert and pleasantly conversant. Able to ambulate into bathroom independently but dyspneic upon return. Physical Therapist present.  Cardiovascular: RRR with early beats/PM Pulmonary: RLL soft insp crackles.  Abdomen: soft, active BS, NTTp Extremities: no contracures Skin: warm, day and intact Neurological: awake and alert; oriented x 3.   Violeta Gelinas NP

## 2017-12-28 DIAGNOSIS — I110 Hypertensive heart disease with heart failure: Secondary | ICD-10-CM | POA: Diagnosis not present

## 2017-12-28 DIAGNOSIS — I5022 Chronic systolic (congestive) heart failure: Secondary | ICD-10-CM | POA: Diagnosis not present

## 2017-12-28 DIAGNOSIS — I480 Paroxysmal atrial fibrillation: Secondary | ICD-10-CM | POA: Diagnosis not present

## 2017-12-28 DIAGNOSIS — J441 Chronic obstructive pulmonary disease with (acute) exacerbation: Secondary | ICD-10-CM | POA: Diagnosis not present

## 2017-12-28 DIAGNOSIS — I495 Sick sinus syndrome: Secondary | ICD-10-CM | POA: Diagnosis not present

## 2017-12-28 DIAGNOSIS — I5033 Acute on chronic diastolic (congestive) heart failure: Secondary | ICD-10-CM | POA: Diagnosis not present

## 2017-12-29 DIAGNOSIS — J441 Chronic obstructive pulmonary disease with (acute) exacerbation: Secondary | ICD-10-CM | POA: Diagnosis not present

## 2017-12-29 DIAGNOSIS — I480 Paroxysmal atrial fibrillation: Secondary | ICD-10-CM | POA: Diagnosis not present

## 2017-12-29 DIAGNOSIS — I5033 Acute on chronic diastolic (congestive) heart failure: Secondary | ICD-10-CM | POA: Diagnosis not present

## 2017-12-29 DIAGNOSIS — I495 Sick sinus syndrome: Secondary | ICD-10-CM | POA: Diagnosis not present

## 2017-12-29 DIAGNOSIS — I5022 Chronic systolic (congestive) heart failure: Secondary | ICD-10-CM | POA: Diagnosis not present

## 2017-12-29 DIAGNOSIS — I110 Hypertensive heart disease with heart failure: Secondary | ICD-10-CM | POA: Diagnosis not present

## 2018-01-01 DIAGNOSIS — I5022 Chronic systolic (congestive) heart failure: Secondary | ICD-10-CM | POA: Diagnosis not present

## 2018-01-01 DIAGNOSIS — I495 Sick sinus syndrome: Secondary | ICD-10-CM | POA: Diagnosis not present

## 2018-01-01 DIAGNOSIS — I110 Hypertensive heart disease with heart failure: Secondary | ICD-10-CM | POA: Diagnosis not present

## 2018-01-01 DIAGNOSIS — J441 Chronic obstructive pulmonary disease with (acute) exacerbation: Secondary | ICD-10-CM | POA: Diagnosis not present

## 2018-01-01 DIAGNOSIS — I5033 Acute on chronic diastolic (congestive) heart failure: Secondary | ICD-10-CM | POA: Diagnosis not present

## 2018-01-01 DIAGNOSIS — I480 Paroxysmal atrial fibrillation: Secondary | ICD-10-CM | POA: Diagnosis not present

## 2018-01-02 DIAGNOSIS — I480 Paroxysmal atrial fibrillation: Secondary | ICD-10-CM | POA: Diagnosis not present

## 2018-01-02 DIAGNOSIS — I495 Sick sinus syndrome: Secondary | ICD-10-CM | POA: Diagnosis not present

## 2018-01-02 DIAGNOSIS — I5022 Chronic systolic (congestive) heart failure: Secondary | ICD-10-CM | POA: Diagnosis not present

## 2018-01-02 DIAGNOSIS — J441 Chronic obstructive pulmonary disease with (acute) exacerbation: Secondary | ICD-10-CM | POA: Diagnosis not present

## 2018-01-02 DIAGNOSIS — I110 Hypertensive heart disease with heart failure: Secondary | ICD-10-CM | POA: Diagnosis not present

## 2018-01-02 DIAGNOSIS — I5033 Acute on chronic diastolic (congestive) heart failure: Secondary | ICD-10-CM | POA: Diagnosis not present

## 2018-01-03 DIAGNOSIS — I110 Hypertensive heart disease with heart failure: Secondary | ICD-10-CM | POA: Diagnosis not present

## 2018-01-03 DIAGNOSIS — I5033 Acute on chronic diastolic (congestive) heart failure: Secondary | ICD-10-CM | POA: Diagnosis not present

## 2018-01-03 DIAGNOSIS — I495 Sick sinus syndrome: Secondary | ICD-10-CM | POA: Diagnosis not present

## 2018-01-03 DIAGNOSIS — J441 Chronic obstructive pulmonary disease with (acute) exacerbation: Secondary | ICD-10-CM | POA: Diagnosis not present

## 2018-01-03 DIAGNOSIS — I5022 Chronic systolic (congestive) heart failure: Secondary | ICD-10-CM | POA: Diagnosis not present

## 2018-01-03 DIAGNOSIS — I480 Paroxysmal atrial fibrillation: Secondary | ICD-10-CM | POA: Diagnosis not present

## 2018-01-04 ENCOUNTER — Ambulatory Visit (INDEPENDENT_AMBULATORY_CARE_PROVIDER_SITE_OTHER): Payer: Medicare Other | Admitting: *Deleted

## 2018-01-04 DIAGNOSIS — I5033 Acute on chronic diastolic (congestive) heart failure: Secondary | ICD-10-CM | POA: Diagnosis not present

## 2018-01-04 DIAGNOSIS — I495 Sick sinus syndrome: Secondary | ICD-10-CM | POA: Diagnosis not present

## 2018-01-04 DIAGNOSIS — I110 Hypertensive heart disease with heart failure: Secondary | ICD-10-CM | POA: Diagnosis not present

## 2018-01-04 DIAGNOSIS — I480 Paroxysmal atrial fibrillation: Secondary | ICD-10-CM | POA: Diagnosis not present

## 2018-01-04 DIAGNOSIS — I5022 Chronic systolic (congestive) heart failure: Secondary | ICD-10-CM | POA: Diagnosis not present

## 2018-01-04 DIAGNOSIS — J441 Chronic obstructive pulmonary disease with (acute) exacerbation: Secondary | ICD-10-CM | POA: Diagnosis not present

## 2018-01-04 NOTE — Progress Notes (Signed)
Remote pacemaker transmission.   

## 2018-01-05 ENCOUNTER — Encounter: Payer: Self-pay | Admitting: Cardiology

## 2018-01-08 ENCOUNTER — Telehealth: Payer: Self-pay | Admitting: Cardiovascular Disease

## 2018-01-08 DIAGNOSIS — I495 Sick sinus syndrome: Secondary | ICD-10-CM | POA: Diagnosis not present

## 2018-01-08 DIAGNOSIS — I110 Hypertensive heart disease with heart failure: Secondary | ICD-10-CM | POA: Diagnosis not present

## 2018-01-08 DIAGNOSIS — I5033 Acute on chronic diastolic (congestive) heart failure: Secondary | ICD-10-CM | POA: Diagnosis not present

## 2018-01-08 DIAGNOSIS — I480 Paroxysmal atrial fibrillation: Secondary | ICD-10-CM | POA: Diagnosis not present

## 2018-01-08 DIAGNOSIS — I5022 Chronic systolic (congestive) heart failure: Secondary | ICD-10-CM | POA: Diagnosis not present

## 2018-01-08 DIAGNOSIS — J441 Chronic obstructive pulmonary disease with (acute) exacerbation: Secondary | ICD-10-CM | POA: Diagnosis not present

## 2018-01-08 NOTE — Telephone Encounter (Signed)
-----   Message from Wanda Plump, RN sent at 01/08/2018  9:17 AM EDT ----- Just FYI [name] AF burden has increased she's has been in persistent AF since June 1. Dr C your note from 11/08/17  mentions restart of [name] if burden increases. She is being F/U with Hospice/Pallative care per last note 12/26/2017.

## 2018-01-08 NOTE — Telephone Encounter (Signed)
Discussed the findings on the most recent pacemaker download with the patient's daughter, [name]. There is increased burden of atrial fibrillation in the setting of persistent respiratory insufficiency with hypoxia since her episode of pneumonia in early June.  She is "not bouncing back" and requires a lot of assistance.  She had a fall in the when she was brushing her teeth.  Ms. [name] thinks her mother's oxygen had fallen off and that she became hypoxic, leading to the fall.  She is very unsteady and frail.  She reports that "she has no quality of life". At this point it appears an appropriate to restart anticoagulation.  The benefit of stroke prevention seems to be at least managed by her risk of bleeding complications.  The focus should be on measures to palliate her symptoms.  We may have to reconsider this decision based on future events.  For example if she develops a TIA or stroke, the risk of recurrent events will be much higher and decided that she would benefit from anticoagulation.  The patient and her daughter agree with these considerations.  Sanda Klein, MD, [facility] [phone] office [phone] pager

## 2018-01-10 DIAGNOSIS — J441 Chronic obstructive pulmonary disease with (acute) exacerbation: Secondary | ICD-10-CM | POA: Diagnosis not present

## 2018-01-10 DIAGNOSIS — I110 Hypertensive heart disease with heart failure: Secondary | ICD-10-CM | POA: Diagnosis not present

## 2018-01-10 DIAGNOSIS — I5022 Chronic systolic (congestive) heart failure: Secondary | ICD-10-CM | POA: Diagnosis not present

## 2018-01-10 DIAGNOSIS — I480 Paroxysmal atrial fibrillation: Secondary | ICD-10-CM | POA: Diagnosis not present

## 2018-01-10 DIAGNOSIS — I5033 Acute on chronic diastolic (congestive) heart failure: Secondary | ICD-10-CM | POA: Diagnosis not present

## 2018-01-10 DIAGNOSIS — I495 Sick sinus syndrome: Secondary | ICD-10-CM | POA: Diagnosis not present

## 2018-01-11 DIAGNOSIS — I5033 Acute on chronic diastolic (congestive) heart failure: Secondary | ICD-10-CM | POA: Diagnosis not present

## 2018-01-11 DIAGNOSIS — I480 Paroxysmal atrial fibrillation: Secondary | ICD-10-CM | POA: Diagnosis not present

## 2018-01-11 DIAGNOSIS — I110 Hypertensive heart disease with heart failure: Secondary | ICD-10-CM | POA: Diagnosis not present

## 2018-01-11 DIAGNOSIS — I495 Sick sinus syndrome: Secondary | ICD-10-CM | POA: Diagnosis not present

## 2018-01-11 DIAGNOSIS — J441 Chronic obstructive pulmonary disease with (acute) exacerbation: Secondary | ICD-10-CM | POA: Diagnosis not present

## 2018-01-11 DIAGNOSIS — I5022 Chronic systolic (congestive) heart failure: Secondary | ICD-10-CM | POA: Diagnosis not present

## 2018-01-12 ENCOUNTER — Non-Acute Institutional Stay: Payer: Medicare Other | Admitting: Nurse Practitioner

## 2018-01-12 DIAGNOSIS — F0390 Unspecified dementia without behavioral disturbance: Secondary | ICD-10-CM

## 2018-01-12 DIAGNOSIS — I442 Atrioventricular block, complete: Secondary | ICD-10-CM

## 2018-01-12 DIAGNOSIS — J449 Chronic obstructive pulmonary disease, unspecified: Secondary | ICD-10-CM

## 2018-01-12 DIAGNOSIS — I4819 Other persistent atrial fibrillation: Secondary | ICD-10-CM

## 2018-01-12 DIAGNOSIS — F339 Major depressive disorder, recurrent, unspecified: Secondary | ICD-10-CM

## 2018-01-12 DIAGNOSIS — I5041 Acute combined systolic (congestive) and diastolic (congestive) heart failure: Secondary | ICD-10-CM

## 2018-01-12 NOTE — Consult Note (Signed)
PALLIATIVE CARE CONSULT VISIT   PATIENT NAME: [name] DOB: 08-Jan-1931 MRN: [id number]  PRIMARY CARE PROVIDER:   Lajean Manes, MD  REFERRING PROVIDER:  Lajean Manes, MD [address] [place], [place] [address]  RESPONSIBLE PARTY:   Jill Side [phone]   Impression and recommendations:  O2 dependent COPD (stable)  -HCAP/+Rhinovirus (6/19)   -Patient reports breathing is at baseline -cont for Txinue [place] oxygen ATC -continue current medications and inhalers  Chronic systolic Heart failure -LVEF 45-50% -BBS CTA today; baseline LE edema -daily weights -continue home furosemide and K+ replacement  Chronic Atrial Fibrillation; CHB s/p pacemaker; not on AC at this time -recently seen by Cards who felt patient's chronic hypoxia was driving force for A. Fib; patient off of AC due to fall -continue metoprolol  Diarrhea for past 2weeks  Decreased appetite (weight stable) -Order C. Diff due to recent hospitalization for Tx of HCAP -Staff to call results to Dr. Lajean Manes -weight today 139.8 -continue mirtazapine -drink ensure TID if patient eats less than 50% of meals  Hypotension (h/o HTN)  -Polymyalgia Rheumatica with chronic steroid use -consider adrenal insuff 2/2 chronic steroid use for Polymyalgia rheumatica -continue PRN midodrine -continue home prednisone  Dementia without behavioral problems  Depression and anxiety -continue current escitalopram and alprazolam  ACP -DNR in place -wishes to avoid hospitalizations    I spent 30 minutes providing this consultation,  from 11:15 to 11:45. More than 50% of the time in this consultation was spent coordinating communication. -Daughter Jill Side called and upated   HISTORY OF PRESENT ILLNESS:  [name] is a 84 y.o. year old female with multiple medical problems including O2 dependent COPD; recent Acute hypoxic respiratory failure/HCAP with + rhinovirus, CHF, Chronic atrial fibrillation,  CHB s/p pacemaker, Palliative Care was asked to continue to follow patient to assist with coordination of care, symptoms management and ongoing support  CODE STATUS: DNR  PPS: 40% HOSPICE ELIGIBILITY/DIAGNOSIS: TBD  PAST MEDICAL HISTORY:  Past Medical History:  Diagnosis Date  . CHB (complete heart block) ([place]) 07/13/2013   Pacemaker dependent  . CHF (congestive heart failure) ([place])   . COPD (chronic obstructive pulmonary disease) ([place])   . DM (dermatomyositis)   . Orthostatic hypotension   . Pacemaker 07/13/2013   Her original pacemaker and the current leads were implanted in 1992. She has had 2 generator change out, most recently in 2010. Her device is a Buyer, retail 2110 non RF dual-chamber pacemaker with a battery longevity estimated at about 7 years. The atrial lead is a St. Jude [id number] and the ventricular lead was a Biotronik [id number].   [name] Paroxysmal atrial fibrillation ([place]) 09/04/2014   on Eliquis  . Polymyalgia rheumatica ([place])   . Scoliosis   . SSS (sick sinus syndrome) ([place]) 07/13/2013  . Syncope   . Systemic hypertension   . Thoracic kyphosis     SOCIAL HX:  Social History   Tobacco Use  . Smoking status: Former Smoker    Packs/day: 0.50    Years: 60.00    Pack years: 30.00    Types: Cigarettes  . Smokeless tobacco: Never Used  . Tobacco comment: quit 12/2016  Substance Use Topics  . Alcohol use: No    ALLERGIES: No Known Allergies   PERTINENT MEDICATIONS:  Outpatient Encounter Medications as of 01/12/2018  Medication Sig  . acetaminophen (TYLENOL) 325 MG tablet Take 325 mg by mouth every 6 (six) hours as needed for mild  pain, moderate pain or fever.   [name] albuterol (PROVENTIL) (2.5 MG/3ML) 0.083% nebulizer solution Take 3 mLs (2.5 mg total) by nebulization every 6 (six) hours as needed for wheezing or shortness of breath.  [name] albuterol (PROVENTIL) (2.5 MG/3ML) 0.083% nebulizer solution Take 3 mLs (2.5 mg total) by nebulization 3 (three) times daily.  [name] alendronate  (FOSAMAX) 70 MG tablet Take 70 mg by mouth every Saturday. Take with a full glass of water on an empty stomach.  . ALPRAZolam (XANAX) 0.25 MG tablet Take 1 tablet (0.25 mg total) at bedtime as needed by mouth for sleep.  [name] docusate sodium (COLACE) 100 MG capsule Take 1 capsule (100 mg total) daily as needed by mouth for mild constipation.  [name] escitalopram (LEXAPRO) 10 MG tablet Take 10 mg by mouth daily.  . furosemide (LASIX) 40 MG tablet Take 0.5 tablets (20 mg total) daily by mouth. (Patient taking differently: Take 40 mg by mouth daily as needed (for weight gain greater than 2lbs). )  . guaiFENesin (MUCINEX) 600 MG 12 hr tablet Take 2 tablets (1,200 mg total) by mouth 2 (two) times daily.  [name] guaiFENesin-dextromethorphan (ROBITUSSIN DM) 100-10 MG/5ML syrup Take 10 mLs by mouth every 4 (four) hours as needed for cough.  [name] HYDROcodone-acetaminophen (NORCO/VICODIN) 5-325 MG tablet Take 1-2 tablets every 4 (four) hours as needed by mouth for moderate pain. (Patient taking differently: Take 1 tablet by mouth daily. )  . metoprolol succinate (TOPROL-XL) 25 MG 24 hr tablet Take 25 mg by mouth daily.   . midodrine (PROAMATINE) 2.5 MG tablet Take 2.5 mg by mouth as needed (when systolic BP is lower than 100).   . mirtazapine (REMERON) 15 MG tablet Take 7.5 mg by mouth at bedtime.   . ondansetron (ZOFRAN) 4 MG tablet Take 4 mg by mouth every 6 (six) hours as needed for nausea or vomiting.  . potassium chloride SA (K-DUR,KLOR-CON) 20 MEQ tablet Take 1 tablet (20 mEq total) daily by mouth. (Patient taking differently: Take 20 mEq by mouth daily as needed (with Lasix). )  . predniSONE (DELTASONE) 10 MG tablet 50mg  po qday x 2 days then 40mg  po qday x 2 days then 30mg  po qday x 2 days then 20mg  po qday x 2 days then 10mg  po qday x 2 days  . umeclidinium-vilanterol (ANORO ELLIPTA) 62.5-25 MCG/INH AEPB Inhale 1 puff into the lungs daily.   No facility-administered encounter medications on file as of 01/12/2018.      PHYSICAL EXAM:   General: NAD, sitting in wheelchair waiting to go to hair appointment Cardiovascular: irregularly irregular; HR 75 Pulmonary: clear ant fields Abdomen: soft, nontender, + bowel sounds GU: no suprapubic tenderness Extremities: 3+ right ankle edema; 2+ left ankle edema, no joint deformities Skin: no rashes Neurological:  Non-focal, oriented x3  Stephanie G Martinique, NP

## 2018-01-15 DIAGNOSIS — I495 Sick sinus syndrome: Secondary | ICD-10-CM | POA: Diagnosis not present

## 2018-01-15 DIAGNOSIS — J441 Chronic obstructive pulmonary disease with (acute) exacerbation: Secondary | ICD-10-CM | POA: Diagnosis not present

## 2018-01-15 DIAGNOSIS — I5033 Acute on chronic diastolic (congestive) heart failure: Secondary | ICD-10-CM | POA: Diagnosis not present

## 2018-01-15 DIAGNOSIS — I110 Hypertensive heart disease with heart failure: Secondary | ICD-10-CM | POA: Diagnosis not present

## 2018-01-15 DIAGNOSIS — I480 Paroxysmal atrial fibrillation: Secondary | ICD-10-CM | POA: Diagnosis not present

## 2018-01-15 DIAGNOSIS — I5022 Chronic systolic (congestive) heart failure: Secondary | ICD-10-CM | POA: Diagnosis not present

## 2018-01-16 DIAGNOSIS — I5033 Acute on chronic diastolic (congestive) heart failure: Secondary | ICD-10-CM | POA: Diagnosis not present

## 2018-01-16 DIAGNOSIS — I480 Paroxysmal atrial fibrillation: Secondary | ICD-10-CM | POA: Diagnosis not present

## 2018-01-16 DIAGNOSIS — J441 Chronic obstructive pulmonary disease with (acute) exacerbation: Secondary | ICD-10-CM | POA: Diagnosis not present

## 2018-01-16 DIAGNOSIS — I495 Sick sinus syndrome: Secondary | ICD-10-CM | POA: Diagnosis not present

## 2018-01-16 DIAGNOSIS — I5022 Chronic systolic (congestive) heart failure: Secondary | ICD-10-CM | POA: Diagnosis not present

## 2018-01-16 DIAGNOSIS — I110 Hypertensive heart disease with heart failure: Secondary | ICD-10-CM | POA: Diagnosis not present

## 2018-01-16 LAB — CUP PACEART REMOTE DEVICE CHECK
Battery Remaining Longevity: 85 mo
Battery Remaining Percentage: 95.5 %
Brady Statistic AP VP Percent: 94 %
Brady Statistic AP VS Percent: 1 %
Brady Statistic AS VS Percent: 1 %
Implantable Lead Implant Date: 19920213
Implantable Lead Location: 753860
Implantable Lead Serial Number: 23000302
Implantable Pulse Generator Implant Date: 20190220
Lead Channel Pacing Threshold Amplitude: 0.75 V
Lead Channel Pacing Threshold Pulse Width: 0.5 ms
Lead Channel Sensing Intrinsic Amplitude: 0.6 mV
Lead Channel Setting Pacing Amplitude: 2.5 V
Lead Channel Setting Pacing Amplitude: 2.5 V
Lead Channel Setting Pacing Pulse Width: 0.5 ms
MDC IDC LEAD IMPLANT DT: 19920213
MDC IDC LEAD LOCATION: 753859
MDC IDC MSMT BATTERY VOLTAGE: 2.99 V
MDC IDC MSMT LEADCHNL RA IMPEDANCE VALUE: 340 Ohm
MDC IDC MSMT LEADCHNL RA PACING THRESHOLD AMPLITUDE: 0.75 V
MDC IDC MSMT LEADCHNL RA PACING THRESHOLD PULSEWIDTH: 0.5 ms
MDC IDC MSMT LEADCHNL RV IMPEDANCE VALUE: 380 Ohm
MDC IDC MSMT LEADCHNL RV SENSING INTR AMPL: 8.7 mV
MDC IDC PG SERIAL: 8999181
MDC IDC SESS DTM: 20190718185253
MDC IDC SET LEADCHNL RV SENSING SENSITIVITY: 4 mV
MDC IDC STAT BRADY AS VP PERCENT: 6.4 %
MDC IDC STAT BRADY RA PERCENT PACED: 36 %
MDC IDC STAT BRADY RV PERCENT PACED: 99 %

## 2018-01-17 DIAGNOSIS — I5022 Chronic systolic (congestive) heart failure: Secondary | ICD-10-CM | POA: Diagnosis not present

## 2018-01-17 DIAGNOSIS — J441 Chronic obstructive pulmonary disease with (acute) exacerbation: Secondary | ICD-10-CM | POA: Diagnosis not present

## 2018-01-17 DIAGNOSIS — I480 Paroxysmal atrial fibrillation: Secondary | ICD-10-CM | POA: Diagnosis not present

## 2018-01-17 DIAGNOSIS — I495 Sick sinus syndrome: Secondary | ICD-10-CM | POA: Diagnosis not present

## 2018-01-17 DIAGNOSIS — I110 Hypertensive heart disease with heart failure: Secondary | ICD-10-CM | POA: Diagnosis not present

## 2018-01-17 DIAGNOSIS — I5033 Acute on chronic diastolic (congestive) heart failure: Secondary | ICD-10-CM | POA: Diagnosis not present

## 2018-01-18 ENCOUNTER — Encounter: Payer: Self-pay | Admitting: Cardiovascular Disease

## 2018-01-18 ENCOUNTER — Telehealth: Payer: Self-pay | Admitting: Cardiovascular Disease

## 2018-01-18 DIAGNOSIS — I5022 Chronic systolic (congestive) heart failure: Secondary | ICD-10-CM | POA: Diagnosis not present

## 2018-01-18 DIAGNOSIS — I495 Sick sinus syndrome: Secondary | ICD-10-CM | POA: Diagnosis not present

## 2018-01-18 DIAGNOSIS — I480 Paroxysmal atrial fibrillation: Secondary | ICD-10-CM | POA: Diagnosis not present

## 2018-01-18 DIAGNOSIS — J441 Chronic obstructive pulmonary disease with (acute) exacerbation: Secondary | ICD-10-CM | POA: Diagnosis not present

## 2018-01-18 DIAGNOSIS — I5033 Acute on chronic diastolic (congestive) heart failure: Secondary | ICD-10-CM | POA: Diagnosis not present

## 2018-01-18 DIAGNOSIS — I110 Hypertensive heart disease with heart failure: Secondary | ICD-10-CM | POA: Diagnosis not present

## 2018-01-18 NOTE — Telephone Encounter (Signed)
New Message   Pt c/o swelling: STAT is pt has developed SOB within 24 hours  1) How much weight have you gained and in what time span? 1 lb a day but not sure the exact weight  2) If swelling, where is the swelling located? Both lower legs and turning black  3) Are you currently taking a fluid pill? Not to daughters knowledged   4) Are you currently SOB? On oxygen  5) Do you have a log of your daily weights (if so, list)?   6) Have you gained 3 pounds in a day or 5 pounds in a week?  7) Have you traveled recently? no

## 2018-01-18 NOTE — Telephone Encounter (Signed)
Spoke with pt's daughter who reports on 12/21/17 Dr. Leta Jungling increased lasix to 40 mg daily. Per Dr. Debara Pickett pt is ok to take an extra 40 mg today (01/18/18)  and extra 40 mg tomorrow (01/19/18). Daughter and Facility made aware. Order faxed to facility.

## 2018-01-18 NOTE — Telephone Encounter (Signed)
Spoke with pt's daughter who state she received a call last week from her mother's physical therapist stating she notice bilateral leg swelling. Daughter has since return and report the swelling is from knee down and seems to be worse today. She report her mother has only gained 1lbs yesterday and a 1 lb today. Routing to MD for recommendation.

## 2018-01-18 NOTE — Telephone Encounter (Signed)
Please refer to sliding scale lasix - can increase lasix for 1-2 days until weight improves.   Dr. Lemmie Evens (for Dr. Loletha Grayer)

## 2018-01-20 DIAGNOSIS — I5022 Chronic systolic (congestive) heart failure: Secondary | ICD-10-CM | POA: Diagnosis not present

## 2018-01-20 DIAGNOSIS — I5033 Acute on chronic diastolic (congestive) heart failure: Secondary | ICD-10-CM | POA: Diagnosis not present

## 2018-01-20 DIAGNOSIS — J441 Chronic obstructive pulmonary disease with (acute) exacerbation: Secondary | ICD-10-CM | POA: Diagnosis not present

## 2018-01-20 DIAGNOSIS — I495 Sick sinus syndrome: Secondary | ICD-10-CM | POA: Diagnosis not present

## 2018-01-20 DIAGNOSIS — I480 Paroxysmal atrial fibrillation: Secondary | ICD-10-CM | POA: Diagnosis not present

## 2018-01-20 DIAGNOSIS — I110 Hypertensive heart disease with heart failure: Secondary | ICD-10-CM | POA: Diagnosis not present

## 2018-01-22 ENCOUNTER — Ambulatory Visit
Admission: RE | Admit: 2018-01-22 | Discharge: 2018-01-22 | Disposition: A | Discharge status: Home / Self Care | Payer: Medicare Other | Source: Ambulatory Visit | Attending: Geriatric Medicine | Admitting: Geriatric Medicine

## 2018-01-22 ENCOUNTER — Other Ambulatory Visit: Payer: Self-pay | Admitting: Geriatric Medicine

## 2018-01-22 DIAGNOSIS — I5033 Acute on chronic diastolic (congestive) heart failure: Secondary | ICD-10-CM | POA: Diagnosis not present

## 2018-01-22 DIAGNOSIS — I110 Hypertensive heart disease with heart failure: Secondary | ICD-10-CM | POA: Diagnosis not present

## 2018-01-22 DIAGNOSIS — J189 Pneumonia, unspecified organism: Secondary | ICD-10-CM

## 2018-01-22 DIAGNOSIS — R197 Diarrhea, unspecified: Secondary | ICD-10-CM | POA: Diagnosis not present

## 2018-01-22 DIAGNOSIS — R05 Cough: Secondary | ICD-10-CM | POA: Diagnosis not present

## 2018-01-22 DIAGNOSIS — I495 Sick sinus syndrome: Secondary | ICD-10-CM | POA: Diagnosis not present

## 2018-01-22 DIAGNOSIS — I5022 Chronic systolic (congestive) heart failure: Secondary | ICD-10-CM | POA: Diagnosis not present

## 2018-01-22 DIAGNOSIS — J441 Chronic obstructive pulmonary disease with (acute) exacerbation: Secondary | ICD-10-CM | POA: Diagnosis not present

## 2018-01-22 DIAGNOSIS — R600 Localized edema: Secondary | ICD-10-CM | POA: Diagnosis not present

## 2018-01-22 DIAGNOSIS — I480 Paroxysmal atrial fibrillation: Secondary | ICD-10-CM | POA: Diagnosis not present

## 2018-01-23 ENCOUNTER — Ambulatory Visit (INDEPENDENT_AMBULATORY_CARE_PROVIDER_SITE_OTHER): Payer: Medicare Other | Admitting: Pulmonary Disease

## 2018-01-23 ENCOUNTER — Encounter: Payer: Self-pay | Admitting: Pulmonary Disease

## 2018-01-23 VITALS — BP 102/60 | HR 80 | Ht 65.0 in | Wt 139.0 lb

## 2018-01-23 DIAGNOSIS — I5022 Chronic systolic (congestive) heart failure: Secondary | ICD-10-CM | POA: Diagnosis not present

## 2018-01-23 DIAGNOSIS — J9620 Acute and chronic respiratory failure, unspecified whether with hypoxia or hypercapnia: Secondary | ICD-10-CM | POA: Diagnosis not present

## 2018-01-23 DIAGNOSIS — J438 Other emphysema: Secondary | ICD-10-CM

## 2018-01-23 DIAGNOSIS — I495 Sick sinus syndrome: Secondary | ICD-10-CM | POA: Diagnosis not present

## 2018-01-23 DIAGNOSIS — J441 Chronic obstructive pulmonary disease with (acute) exacerbation: Secondary | ICD-10-CM | POA: Diagnosis not present

## 2018-01-23 DIAGNOSIS — I480 Paroxysmal atrial fibrillation: Secondary | ICD-10-CM | POA: Diagnosis not present

## 2018-01-23 DIAGNOSIS — I5033 Acute on chronic diastolic (congestive) heart failure: Secondary | ICD-10-CM | POA: Diagnosis not present

## 2018-01-23 DIAGNOSIS — I110 Hypertensive heart disease with heart failure: Secondary | ICD-10-CM | POA: Diagnosis not present

## 2018-01-23 DIAGNOSIS — T17908S Unspecified foreign body in respiratory tract, part unspecified causing other injury, sequela: Secondary | ICD-10-CM

## 2018-01-23 NOTE — Patient Instructions (Addendum)
I am glad you are feeling better after your recent pneumonia A chest x-ray from yesterday shows improvement in the lung opacity Continue your inhalers and supplemental oxygen It is okay to take off the oxygen while at rest as long as the sats remain greater than 90% We will see how we can get you fitted with a portable concentrator Follow-up in 6 months.

## 2018-01-23 NOTE — Progress Notes (Signed)
ABI SHOULTS    [id number]    1931/02/25  Primary Care Physician:Stoneking, Christiane Ha, MD  Referring Physician: Lajean Manes, MD [address] [place], [place] [address]  Chief complaint:   Follow up for  COPD Recurrent pneumonia, MAI infection Chronic aspiration   HPI: 84 year old with history of complete heart block status post pacemaker, diastolic heart failure, paroxysmal A. fib, and prolymyalgia rheumatica, COPD. She was [facility] to [facility] in April, 2018 with fall while on a trip outside [place]. She did not seek medical attention for about a week and came with worsening dyspnea, cough, congestion, mucus production, wheezing. CT scan in the ED showed fractured ribs on the right with extensive consolidation, narrowing of the bronchus on the right with question on endobronchial lesion. PCCM called for further evaluation. She underwent a bronchoscopy on 10/07/16 which did not show any endobronchial lesion. There is purulent secretions in the right lung which was sent for culture. The microbiology shows Pseudomonas and viridans strep which was treated adequately with antibiotics. The AFB culture grew MAI.  She's had multiple admissions in 2018 for acute on chronic respiratory failure, CHF, COPD exacerbation. Readmitted on 04/21/17 with hospital-acquired pneumonia, COPD exacerbation and acute diastolic heart failure.  Swallow eval during this admission noted to have chronic aspiration and placed on aspiration precautions  Interim History: She was hospitalized in early June for right middle lobe infiltrate, positive rhinovirus, acute hypoxic respiratory failure. Discharged on supplemental oxygen  Lasix dose increased recently for lower extremity edema.  She had a chest x-ray yesterday which showed improvement of right lung infiltrate. In office today she states that breathing is at baseline.  She is frustrated with oxygen and wants to stop it.. Continues on  anoro. Continues under palliative care at her nursing home.  Outpatient Encounter Medications as of 01/23/2018  Medication Sig  . acetaminophen (TYLENOL) 325 MG tablet Take 325 mg by mouth every 6 (six) hours as needed for mild pain, moderate pain or fever.   [name] albuterol (PROVENTIL) (2.5 MG/3ML) 0.083% nebulizer solution Take 3 mLs (2.5 mg total) by nebulization every 6 (six) hours as needed for wheezing or shortness of breath.  [name] albuterol (PROVENTIL) (2.5 MG/3ML) 0.083% nebulizer solution Take 3 mLs (2.5 mg total) by nebulization 3 (three) times daily.  [name] alendronate (FOSAMAX) 70 MG tablet Take 70 mg by mouth every Saturday. Take with a full glass of water on an empty stomach.  . ALPRAZolam (XANAX) 0.25 MG tablet Take 1 tablet (0.25 mg total) at bedtime as needed by mouth for sleep.  [name] docusate sodium (COLACE) 100 MG capsule Take 1 capsule (100 mg total) daily as needed by mouth for mild constipation.  [name] escitalopram (LEXAPRO) 10 MG tablet Take 10 mg by mouth daily.  . furosemide (LASIX) 40 MG tablet Take 0.5 tablets (20 mg total) daily by mouth. (Patient taking differently: Take 40 mg by mouth daily as needed (for weight gain greater than 2lbs). )  . guaiFENesin (MUCINEX) 600 MG 12 hr tablet Take 2 tablets (1,200 mg total) by mouth 2 (two) times daily.  [name] guaiFENesin-dextromethorphan (ROBITUSSIN DM) 100-10 MG/5ML syrup Take 10 mLs by mouth every 4 (four) hours as needed for cough.  [name] HYDROcodone-acetaminophen (NORCO/VICODIN) 5-325 MG tablet Take 1-2 tablets every 4 (four) hours as needed by mouth for moderate pain. (Patient taking differently: Take 1 tablet by mouth daily. )  . metoprolol succinate (TOPROL-XL) 25 MG 24 hr tablet Take 25  mg by mouth daily.   . midodrine (PROAMATINE) 2.5 MG tablet Take 2.5 mg by mouth as needed (when systolic BP is lower than 100).   . mirtazapine (REMERON) 15 MG tablet Take 7.5 mg by mouth at bedtime.   . ondansetron (ZOFRAN) 4 MG tablet Take 4 mg by mouth every 6  (six) hours as needed for nausea or vomiting.  . potassium chloride SA (K-DUR,KLOR-CON) 20 MEQ tablet Take 1 tablet (20 mEq total) daily by mouth. (Patient taking differently: Take 20 mEq by mouth daily as needed (with Lasix). )  . predniSONE (DELTASONE) 10 MG tablet 70m po qday x 2 days then 418mpo qday x 2 days then 3072mo qday x 2 days then 44m57m qday x 2 days then 10mg67mqday x 2 days  . umeclidinium-vilanterol (ANORO ELLIPTA) 62.5-25 MCG/INH AEPB Inhale 1 puff into the lungs daily.   No facility-administered encounter medications on file as of 01/23/2018.     Allergies as of 01/23/2018  . (No Known Allergies)    Past Medical History:  Diagnosis Date  . CHB (complete heart block) (HCC) [place]4/2015   Pacemaker dependent  . CHF (congestive heart failure) (HCC) [place] COPD (chronic obstructive pulmonary disease) (HCC) [place] DM (dermatomyositis)   . Orthostatic hypotension   . Pacemaker 07/13/2013   Her original pacemaker and the current leads were implanted in 1992. She has had 2 generator change out, most recently in 2010. Her device is a St. JBuyer, retail non RF dual-chamber pacemaker with a battery longevity estimated at about 7 years. The atrial lead is a St. Jude [id number] ventricular lead was a Biotronik [id number].   . ParMarland Kitchenxysmal atrial fibrillation (HCC) [place]17/2016   on Eliquis  . Polymyalgia rheumatica (HCC) [place] Scoliosis   . SSS (sick sinus syndrome) (HCC) [place]4/2015  . Syncope   . Systemic hypertension   . Thoracic kyphosis     Past Surgical History:  Procedure Laterality Date  . IR KYPHO THORACIC WITH BONE BIOPSY  01/04/2017  . IR RADIOLOGIST EVAL & MGMT  01/10/2017  . NM MYOCAR PERF WALL MOTION  09/13/2011   Low risk  . PERMANENT PACEMAKER GENERATOR CHANGE  03/27/2009   St.Jude  . PPM GENERATOR CHANGEOUT N/A 08/09/2017   Procedure: PPM GENERATOR CHANGEOUT;  Surgeon: CroitSanda Klein  [facility];  Service: Cardiovascular;  Laterality: N/A;  . US  [place]GRAP[place]  03/28/2012   Mod LAE,mild MR,aortic sclerosis w/mod AI,mod. TR,mild PI,Stage I diastolic dysfunction  . VIDEO BRONCHOSCOPY Bilateral 10/07/2016   Procedure: VIDEO BRONCHOSCOPY WITH FLUORO;  Surgeon: PraveMarshell Garfinkel  Location: [facility];  Service: Cardiopulmonary;  Laterality: Bilateral;    Family History  Problem Relation Age of Onset  . Stroke Mother     Social History   Socioeconomic History  . Marital status: Married    Spouse name: Not on file  . Number of children: Not on file  . Years of education: Not on file  . Highest education level: Not on file  Occupational History  . Occupation: retired  SociaScientific laboratory technicianinancial resource strain: Not on file  . Food insecurity:    Worry: Not on file    Inability: Not on file  . Transportation needs:    Medical: Not on file    Non-medical: Not on file  Tobacco Use  . Smoking status: Former Smoker    Packs/day: 0.50    Years: 60.00  Pack years: 30.00    Types: Cigarettes  . Smokeless tobacco: Never Used  . Tobacco comment: quit 12/2016  Substance and Sexual Activity  . Alcohol use: No  . Drug use: No  . Sexual activity: Not Currently    Partners: Female  Lifestyle  . Physical activity:    Days per week: Not on file    Minutes per session: Not on file  . Stress: Not on file  Relationships  . Social connections:    Talks on phone: Not on file    Gets together: Not on file    Attends religious service: Not on file    Active member of club or organization: Not on file    Attends meetings of clubs or organizations: Not on file    Relationship status: Not on file  . Intimate partner violence:    Fear of current or ex partner: Not on file    Emotionally abused: Not on file    Physically abused: Not on file    Forced sexual activity: Not on file  Other Topics Concern  . Not on file  Social History Narrative  . Not on file   Review of systems: Review of Systems  Constitutional: Negative for  fever and chills.  HENT: Negative.   Eyes: Negative for blurred vision.  Respiratory: as per HPI  Cardiovascular: Negative for chest pain and palpitations.  Gastrointestinal: Negative for vomiting, diarrhea, blood per rectum. Genitourinary: Negative for dysuria, urgency, frequency and hematuria.  Musculoskeletal: Negative for myalgias, back pain and joint pain.  Skin: Negative for itching and rash.  Neurological: Negative for dizziness, tremors, focal weakness, seizures and loss of consciousness.  Endo/Heme/Allergies: Negative for environmental allergies.  Psychiatric/Behavioral: Negative for depression, suicidal ideas and hallucinations.  All other systems reviewed and are negative.  Physical Exam: Blood pressure 102/60, pulse 80, height _0  (1.651 m), weight 139 lb (63 kg), SpO2 99 %. Gen:      No acute distress HEENT:  EOMI, sclera anicteric Neck:     No masses; no thyromegaly Lungs:    Clear to auscultation bilaterally; normal respiratory effort CV:         Regular rate and rhythm; no murmurs Abd:      + bowel sounds; soft, non-tender; no palpable masses, no distension Ext:    1-2+ edema; adequate peripheral perfusion Skin:      Warm and dry; no rash Neuro: alert and oriented x 3 Psych: normal mood and affect  Data Reviewed: Bronchoscopy culture 10/07/16-  Pseudomonas aeruginosa Strep viridans MAI  Echocardiogram 10/12/14 Apical hypokinesis with mildly reduced LV systolic function; mild  LVH; sclerotic aortic valve with mild AI; mild MR; mild biatrial  enlargement; mild RVE; mild TR; mildly elevated pulmonary  pressure.  CT chest 10/05/16 - trace right effusion, multiple rib fractures on R, extensive consolidative changes and RML / LL and LL with narrowing of right mainstem bronchus. Subcarinal lymph node.  CT angiogram 01/02/17-no pulmonary medicine, improved infiltrates in the right lower lobe and right middle lobe. Scattered atelectasis, bronchial wall thickening, small  effusions CT chest 04/22/17-minimal pleural effusion, lower lobe opacities consistent with atelectasis with scattered groundglass. CT chest 11/20/17- consolidation of the right middle lobe Chest x-ray 01/22/2018- chronic bronchitis changes.  No residual infiltrate noted. I had reviewed the images personally.  PFTs 03/24/17 FVC 2.19 (62%], FEV1 1.61 [54%], F/F 73, TLC 92%, RV/TLC 141%, DLCO 44% Moderate severe obstruction with air trapping, severe diffusion defect.  CBC differential 04/21/17- absolute  eosinophil count < 150  Swallow eval 04/22/17-mild to moderate oropharyngeal dysphagia  Overnight oximetry 05/16/17 Total time 4 hours O2 sat 90% average, low 88%. Pulse 73 average, highest level recorded 109. There are no desaturations.  No oxygen required.  Assessment:  COPD Recurrent respiratory failure with pneumonia. CT from recent admission reviewed with right middle lobe infiltrate, consolidation which is similar to her presentation in 2018 There is no evidence of endobronchial lesion on her prior bronchoscopy. Follow-up chest x-ray shows improvement in lung opacity. We will continue to monitor.  Discussed supplemental oxygen.  She would like to get off the oxygen but did desat on exertion today.  I have advised her to continue using the oxygen on exertion and at night At rest it is okay to stop the oxygen as long as sats remained greater than 90% Will work on getting her a Marine scientist. Continue on prednisone at 10 mg which she is on for polymyalgia rheumatica.  Positive MAI on BAL Bronchoscopy in early 2018 showed MAI.  We had elected not to treat her given her age and frailty.    Chronic aspiration Swallow study noted with mild to moderate oropharyngeal dysphagia.   Continue on aspiration precautions  End of life care Being seen by palliative care at her nursing home. Code status-DNR  Plan/Recommendations: - Continue anoro, duo nebs as needed. - Continue  supplemental oxygen.  Marshell Garfinkel MD [facility] 01/23/2018, 11:03 AM  CC: Lajean Manes, MD

## 2018-01-24 ENCOUNTER — Telehealth: Payer: Self-pay

## 2018-01-24 DIAGNOSIS — I110 Hypertensive heart disease with heart failure: Secondary | ICD-10-CM | POA: Diagnosis not present

## 2018-01-24 DIAGNOSIS — I5033 Acute on chronic diastolic (congestive) heart failure: Secondary | ICD-10-CM | POA: Diagnosis not present

## 2018-01-24 DIAGNOSIS — I480 Paroxysmal atrial fibrillation: Secondary | ICD-10-CM | POA: Diagnosis not present

## 2018-01-24 DIAGNOSIS — I5022 Chronic systolic (congestive) heart failure: Secondary | ICD-10-CM | POA: Diagnosis not present

## 2018-01-24 DIAGNOSIS — J441 Chronic obstructive pulmonary disease with (acute) exacerbation: Secondary | ICD-10-CM | POA: Diagnosis not present

## 2018-01-24 DIAGNOSIS — I495 Sick sinus syndrome: Secondary | ICD-10-CM | POA: Diagnosis not present

## 2018-01-24 NOTE — Telephone Encounter (Addendum)
Dr. Vaughan Browner, we saw this patient yesterday and her daughter wanted to speak to you (she is on DPR) about her mom's condition. She has questions about what stage her disease is in and if hospice is necessary. Please call her at [phone]. She will be available all day but has an appt between :12:15-1:30, call before or after that time.

## 2018-01-26 DIAGNOSIS — I110 Hypertensive heart disease with heart failure: Secondary | ICD-10-CM | POA: Diagnosis not present

## 2018-01-26 DIAGNOSIS — I5033 Acute on chronic diastolic (congestive) heart failure: Secondary | ICD-10-CM | POA: Diagnosis not present

## 2018-01-26 DIAGNOSIS — I480 Paroxysmal atrial fibrillation: Secondary | ICD-10-CM | POA: Diagnosis not present

## 2018-01-26 DIAGNOSIS — I495 Sick sinus syndrome: Secondary | ICD-10-CM | POA: Diagnosis not present

## 2018-01-26 DIAGNOSIS — I5022 Chronic systolic (congestive) heart failure: Secondary | ICD-10-CM | POA: Diagnosis not present

## 2018-01-26 DIAGNOSIS — J441 Chronic obstructive pulmonary disease with (acute) exacerbation: Secondary | ICD-10-CM | POA: Diagnosis not present

## 2018-01-29 DIAGNOSIS — I5033 Acute on chronic diastolic (congestive) heart failure: Secondary | ICD-10-CM | POA: Diagnosis not present

## 2018-01-29 DIAGNOSIS — S41011D Laceration without foreign body of right shoulder, subsequent encounter: Secondary | ICD-10-CM | POA: Diagnosis not present

## 2018-01-29 DIAGNOSIS — I110 Hypertensive heart disease with heart failure: Secondary | ICD-10-CM | POA: Diagnosis not present

## 2018-01-29 DIAGNOSIS — J449 Chronic obstructive pulmonary disease, unspecified: Secondary | ICD-10-CM | POA: Diagnosis not present

## 2018-01-29 DIAGNOSIS — I5022 Chronic systolic (congestive) heart failure: Secondary | ICD-10-CM | POA: Diagnosis not present

## 2018-01-29 DIAGNOSIS — I270 Primary pulmonary hypertension: Secondary | ICD-10-CM | POA: Diagnosis not present

## 2018-01-30 DIAGNOSIS — I110 Hypertensive heart disease with heart failure: Secondary | ICD-10-CM | POA: Diagnosis not present

## 2018-01-30 DIAGNOSIS — S41011D Laceration without foreign body of right shoulder, subsequent encounter: Secondary | ICD-10-CM | POA: Diagnosis not present

## 2018-01-30 DIAGNOSIS — I5022 Chronic systolic (congestive) heart failure: Secondary | ICD-10-CM | POA: Diagnosis not present

## 2018-01-30 DIAGNOSIS — J449 Chronic obstructive pulmonary disease, unspecified: Secondary | ICD-10-CM | POA: Diagnosis not present

## 2018-01-30 DIAGNOSIS — I270 Primary pulmonary hypertension: Secondary | ICD-10-CM | POA: Diagnosis not present

## 2018-01-30 DIAGNOSIS — I5033 Acute on chronic diastolic (congestive) heart failure: Secondary | ICD-10-CM | POA: Diagnosis not present

## 2018-01-30 NOTE — Telephone Encounter (Signed)
Discussed with daughter over telephone as she had questions if the patient is eligible for hospice [name] has clearly declined over the past year however I am not sure if she is a candidate for hospice as we cannot determine that she has less than 6 months life expectancy with certainty. Palliative care notes from inpatient and outpatient reviewed, it appears that the patient wants to continue current level of care. It is reasonable to continue palliative care approach for now and avoid further hospitalizations if possible.  Continue monitoring for progression.   Debbie Garfinkel MD [facility] 01/30/2018, 12:08 PM  CC- Dr. Felipa Eth, Dr.Croitoru

## 2018-01-31 ENCOUNTER — Non-Acute Institutional Stay: Payer: Medicare Other | Admitting: Nurse Practitioner

## 2018-01-31 ENCOUNTER — Encounter: Payer: Self-pay | Admitting: Nurse Practitioner

## 2018-01-31 DIAGNOSIS — S41011D Laceration without foreign body of right shoulder, subsequent encounter: Secondary | ICD-10-CM | POA: Diagnosis not present

## 2018-01-31 DIAGNOSIS — J439 Emphysema, unspecified: Secondary | ICD-10-CM

## 2018-01-31 DIAGNOSIS — I270 Primary pulmonary hypertension: Secondary | ICD-10-CM | POA: Diagnosis not present

## 2018-01-31 DIAGNOSIS — R600 Localized edema: Secondary | ICD-10-CM

## 2018-01-31 DIAGNOSIS — I5022 Chronic systolic (congestive) heart failure: Secondary | ICD-10-CM

## 2018-01-31 DIAGNOSIS — I110 Hypertensive heart disease with heart failure: Secondary | ICD-10-CM | POA: Diagnosis not present

## 2018-01-31 DIAGNOSIS — R269 Unspecified abnormalities of gait and mobility: Secondary | ICD-10-CM

## 2018-01-31 DIAGNOSIS — J449 Chronic obstructive pulmonary disease, unspecified: Secondary | ICD-10-CM | POA: Diagnosis not present

## 2018-01-31 DIAGNOSIS — I5033 Acute on chronic diastolic (congestive) heart failure: Secondary | ICD-10-CM | POA: Diagnosis not present

## 2018-01-31 DIAGNOSIS — Z515 Encounter for palliative care: Secondary | ICD-10-CM

## 2018-01-31 NOTE — Progress Notes (Signed)
PALLIATIVE CARE CONSULT VISIT   PATIENT NAME: [name] DOB: 06/22/1930 MRN: [id number]  PRIMARY CARE PROVIDER:   Lajean Manes, MD  REFERRING PROVIDER:  Lajean Manes, MD [address] [place], [place] [address]  RESPONSIBLE PARTY:   [name] [phone]  ASSESSMENT/RECOMMENDATIONS    Impression and recommendations:  O2 dependent COPD (stable)  -HCAP/+Rhinovirus (6/19)   -Patient reports breathing is at baseline -cont for [facility] [place] oxygen ATC -continue current medications and inhalers  Chronic systolic Heart failure -LVEF 45-50% -lower ext edema -BBS CTA today; baseline LE edema -daily weights -continue home furosemide and K+ replacement  Chronic Atrial Fibrillation; CHB s/p pacemaker; not on AC at this time -recently seen by Cards who felt patient's chronic hypoxia was driving force for A. Fib; patient off of AC due to fall -continue metoprolol  Hypotension (h/o HTN)  -Polymyalgia Rheumatica with chronic steroid use -continue PRN midodrine -continue home prednisone  Dementia without behavioral problems  Gait disturbance  Depression and anxiety -continue current escitalopram and alprazolam -uses wheelchair  ACP -DNR in place -wishes to avoid hospitalizations    I spent 15 minutes providing this consultation,  from 1:00 to 1:15. More than 50% of the time in this consultation was spent coordinating communication.   HISTORY OF PRESENT ILLNESS:  [name] is a 84 y.o. year old female with multiple medical problems including O2 dependent COPD; recent Acute hypoxic respiratory failure/HCAP with + rhinovirus, CHF, Chronic atrial fibrillation, CHB s/p pacemaker, Palliative Care was asked to continue to follow patient to assist with coordination of care, symptoms management and ongoing support   Patient reports she fell/stumbled getting in bed her left hand hit her right upper arm and sustained a superficial skin tear. Patient denies,  dizziness, chest pain, increased shortness of breath, no nausea, no diarrhea  CODE STATUS: DNR  PPS: 40% HOSPICE ELIGIBILITY/DIAGNOSIS: TBD  PAST MEDICAL HISTORY:  Past Medical History:  Diagnosis Date  . CHB (complete heart block) ([place]) 07/13/2013   Pacemaker dependent  . CHF (congestive heart failure) ([place])   . COPD (chronic obstructive pulmonary disease) ([place])   . DM (dermatomyositis)   . Orthostatic hypotension   . Pacemaker 07/13/2013   Her original pacemaker and the current leads were implanted in 1992. She has had 2 generator change out, most recently in 2010. Her device is a Buyer, retail 2110 non RF dual-chamber pacemaker with a battery longevity estimated at about 7 years. The atrial lead is a St. Jude [id number] and the ventricular lead was a Biotronik [id number].   [name] Paroxysmal atrial fibrillation ([place]) 09/04/2014   on Eliquis  . Polymyalgia rheumatica ([place])   . Scoliosis   . SSS (sick sinus syndrome) ([place]) 07/13/2013  . Syncope   . Systemic hypertension   . Thoracic kyphosis     SOCIAL HX:  Social History   Tobacco Use  . Smoking status: Former Smoker    Packs/day: 0.50    Years: 60.00    Pack years: 30.00    Types: Cigarettes  . Smokeless tobacco: Never Used  . Tobacco comment: quit 12/2016  Substance Use Topics  . Alcohol use: No    ALLERGIES: No Known Allergies   PERTINENT MEDICATIONS:  Outpatient Encounter Medications as of 01/31/2018  Medication Sig  . acetaminophen (TYLENOL) 325 MG tablet Take 325 mg by mouth every 6 (six) hours as needed for mild pain, moderate pain or fever.   [name] albuterol (PROVENTIL) (2.5 MG/3ML) 0.083% nebulizer  solution Take 3 mLs (2.5 mg total) by nebulization every 6 (six) hours as needed for wheezing or shortness of breath.  [name] albuterol (PROVENTIL) (2.5 MG/3ML) 0.083% nebulizer solution Take 3 mLs (2.5 mg total) by nebulization 3 (three) times daily.  [name] alendronate (FOSAMAX) 70 MG tablet Take 70 mg by mouth every Saturday. Take with a  full glass of water on an empty stomach.  . ALPRAZolam (XANAX) 0.25 MG tablet Take 1 tablet (0.25 mg total) at bedtime as needed by mouth for sleep.  [name] docusate sodium (COLACE) 100 MG capsule Take 1 capsule (100 mg total) daily as needed by mouth for mild constipation.  [name] escitalopram (LEXAPRO) 10 MG tablet Take 10 mg by mouth daily.  . furosemide (LASIX) 40 MG tablet Take 0.5 tablets (20 mg total) daily by mouth. (Patient taking differently: Take 40 mg by mouth daily as needed (for weight gain greater than 2lbs). )  . guaiFENesin (MUCINEX) 600 MG 12 hr tablet Take 2 tablets (1,200 mg total) by mouth 2 (two) times daily.  [name] guaiFENesin-dextromethorphan (ROBITUSSIN DM) 100-10 MG/5ML syrup Take 10 mLs by mouth every 4 (four) hours as needed for cough.  [name] HYDROcodone-acetaminophen (NORCO/VICODIN) 5-325 MG tablet Take 1-2 tablets every 4 (four) hours as needed by mouth for moderate pain. (Patient taking differently: Take 1 tablet by mouth daily. )  . metoprolol succinate (TOPROL-XL) 25 MG 24 hr tablet Take 25 mg by mouth daily.   . midodrine (PROAMATINE) 2.5 MG tablet Take 2.5 mg by mouth as needed (when systolic BP is lower than 100).   . mirtazapine (REMERON) 15 MG tablet Take 7.5 mg by mouth at bedtime.   . ondansetron (ZOFRAN) 4 MG tablet Take 4 mg by mouth every 6 (six) hours as needed for nausea or vomiting.  . potassium chloride SA (K-DUR,KLOR-CON) 20 MEQ tablet Take 1 tablet (20 mEq total) daily by mouth. (Patient taking differently: Take 20 mEq by mouth daily as needed (with Lasix). )  . predniSONE (DELTASONE) 10 MG tablet 50mg  po qday x 2 days then 40mg  po qday x 2 days then 30mg  po qday x 2 days then 20mg  po qday x 2 days then 10mg  po qday x 2 days  . umeclidinium-vilanterol (ANORO ELLIPTA) 62.5-25 MCG/INH AEPB Inhale 1 puff into the lungs daily.   No facility-administered encounter medications on file as of 01/31/2018.     PHYSICAL EXAM:   General: NAD, WD/WN Cardiovascular: regular rate  and rhythm Pulmonary: clear ant fields; nasal O2 at 2L/min Abdomen: soft, nontender, + bowel sounds GU: no suprapubic tenderness Extremities: 2+ pitting edema of BLE; no joint deformities Skin: no rashes Neurological: Weakness but otherwise nonfocal  Kimon Loewen G [place], NP

## 2018-02-01 DIAGNOSIS — I5033 Acute on chronic diastolic (congestive) heart failure: Secondary | ICD-10-CM | POA: Diagnosis not present

## 2018-02-01 DIAGNOSIS — I110 Hypertensive heart disease with heart failure: Secondary | ICD-10-CM | POA: Diagnosis not present

## 2018-02-01 DIAGNOSIS — J449 Chronic obstructive pulmonary disease, unspecified: Secondary | ICD-10-CM | POA: Diagnosis not present

## 2018-02-01 DIAGNOSIS — I270 Primary pulmonary hypertension: Secondary | ICD-10-CM | POA: Diagnosis not present

## 2018-02-01 DIAGNOSIS — I5022 Chronic systolic (congestive) heart failure: Secondary | ICD-10-CM | POA: Diagnosis not present

## 2018-02-01 DIAGNOSIS — S41011D Laceration without foreign body of right shoulder, subsequent encounter: Secondary | ICD-10-CM | POA: Diagnosis not present

## 2018-02-02 DIAGNOSIS — I110 Hypertensive heart disease with heart failure: Secondary | ICD-10-CM | POA: Diagnosis not present

## 2018-02-02 DIAGNOSIS — I5033 Acute on chronic diastolic (congestive) heart failure: Secondary | ICD-10-CM | POA: Diagnosis not present

## 2018-02-02 DIAGNOSIS — I270 Primary pulmonary hypertension: Secondary | ICD-10-CM | POA: Diagnosis not present

## 2018-02-02 DIAGNOSIS — S41011D Laceration without foreign body of right shoulder, subsequent encounter: Secondary | ICD-10-CM | POA: Diagnosis not present

## 2018-02-02 DIAGNOSIS — I5022 Chronic systolic (congestive) heart failure: Secondary | ICD-10-CM | POA: Diagnosis not present

## 2018-02-02 DIAGNOSIS — J449 Chronic obstructive pulmonary disease, unspecified: Secondary | ICD-10-CM | POA: Diagnosis not present

## 2018-02-06 DIAGNOSIS — J449 Chronic obstructive pulmonary disease, unspecified: Secondary | ICD-10-CM | POA: Diagnosis not present

## 2018-02-06 DIAGNOSIS — S41011D Laceration without foreign body of right shoulder, subsequent encounter: Secondary | ICD-10-CM | POA: Diagnosis not present

## 2018-02-06 DIAGNOSIS — I5033 Acute on chronic diastolic (congestive) heart failure: Secondary | ICD-10-CM | POA: Diagnosis not present

## 2018-02-06 DIAGNOSIS — I5022 Chronic systolic (congestive) heart failure: Secondary | ICD-10-CM | POA: Diagnosis not present

## 2018-02-06 DIAGNOSIS — I110 Hypertensive heart disease with heart failure: Secondary | ICD-10-CM | POA: Diagnosis not present

## 2018-02-06 DIAGNOSIS — I270 Primary pulmonary hypertension: Secondary | ICD-10-CM | POA: Diagnosis not present

## 2018-02-09 DIAGNOSIS — S41011D Laceration without foreign body of right shoulder, subsequent encounter: Secondary | ICD-10-CM | POA: Diagnosis not present

## 2018-02-09 DIAGNOSIS — I5022 Chronic systolic (congestive) heart failure: Secondary | ICD-10-CM | POA: Diagnosis not present

## 2018-02-09 DIAGNOSIS — J449 Chronic obstructive pulmonary disease, unspecified: Secondary | ICD-10-CM | POA: Diagnosis not present

## 2018-02-09 DIAGNOSIS — I270 Primary pulmonary hypertension: Secondary | ICD-10-CM | POA: Diagnosis not present

## 2018-02-09 DIAGNOSIS — I5033 Acute on chronic diastolic (congestive) heart failure: Secondary | ICD-10-CM | POA: Diagnosis not present

## 2018-02-09 DIAGNOSIS — I110 Hypertensive heart disease with heart failure: Secondary | ICD-10-CM | POA: Diagnosis not present

## 2018-02-12 DIAGNOSIS — I110 Hypertensive heart disease with heart failure: Secondary | ICD-10-CM | POA: Diagnosis not present

## 2018-02-12 DIAGNOSIS — S41011D Laceration without foreign body of right shoulder, subsequent encounter: Secondary | ICD-10-CM | POA: Diagnosis not present

## 2018-02-12 DIAGNOSIS — I5022 Chronic systolic (congestive) heart failure: Secondary | ICD-10-CM | POA: Diagnosis not present

## 2018-02-12 DIAGNOSIS — I270 Primary pulmonary hypertension: Secondary | ICD-10-CM | POA: Diagnosis not present

## 2018-02-12 DIAGNOSIS — J449 Chronic obstructive pulmonary disease, unspecified: Secondary | ICD-10-CM | POA: Diagnosis not present

## 2018-02-12 DIAGNOSIS — I5033 Acute on chronic diastolic (congestive) heart failure: Secondary | ICD-10-CM | POA: Diagnosis not present

## 2018-02-13 ENCOUNTER — Telehealth: Payer: Self-pay | Admitting: Pulmonary Disease

## 2018-02-13 DIAGNOSIS — I5033 Acute on chronic diastolic (congestive) heart failure: Secondary | ICD-10-CM | POA: Diagnosis not present

## 2018-02-13 DIAGNOSIS — I110 Hypertensive heart disease with heart failure: Secondary | ICD-10-CM | POA: Diagnosis not present

## 2018-02-13 DIAGNOSIS — S41011D Laceration without foreign body of right shoulder, subsequent encounter: Secondary | ICD-10-CM | POA: Diagnosis not present

## 2018-02-13 DIAGNOSIS — J449 Chronic obstructive pulmonary disease, unspecified: Secondary | ICD-10-CM | POA: Diagnosis not present

## 2018-02-13 DIAGNOSIS — J9620 Acute and chronic respiratory failure, unspecified whether with hypoxia or hypercapnia: Secondary | ICD-10-CM

## 2018-02-13 DIAGNOSIS — I5022 Chronic systolic (congestive) heart failure: Secondary | ICD-10-CM | POA: Diagnosis not present

## 2018-02-13 DIAGNOSIS — I270 Primary pulmonary hypertension: Secondary | ICD-10-CM | POA: Diagnosis not present

## 2018-02-13 NOTE — Telephone Encounter (Signed)
Spoke with daughter [name], order placed for walk to qualify for POC with [facility]. Nothing further needed at this time.

## 2018-02-14 DIAGNOSIS — I270 Primary pulmonary hypertension: Secondary | ICD-10-CM | POA: Diagnosis not present

## 2018-02-14 DIAGNOSIS — I5022 Chronic systolic (congestive) heart failure: Secondary | ICD-10-CM | POA: Diagnosis not present

## 2018-02-14 DIAGNOSIS — S41011D Laceration without foreign body of right shoulder, subsequent encounter: Secondary | ICD-10-CM | POA: Diagnosis not present

## 2018-02-14 DIAGNOSIS — I5033 Acute on chronic diastolic (congestive) heart failure: Secondary | ICD-10-CM | POA: Diagnosis not present

## 2018-02-14 DIAGNOSIS — I110 Hypertensive heart disease with heart failure: Secondary | ICD-10-CM | POA: Diagnosis not present

## 2018-02-14 DIAGNOSIS — J449 Chronic obstructive pulmonary disease, unspecified: Secondary | ICD-10-CM | POA: Diagnosis not present

## 2018-02-16 DIAGNOSIS — I5022 Chronic systolic (congestive) heart failure: Secondary | ICD-10-CM | POA: Diagnosis not present

## 2018-02-16 DIAGNOSIS — I270 Primary pulmonary hypertension: Secondary | ICD-10-CM | POA: Diagnosis not present

## 2018-02-16 DIAGNOSIS — J449 Chronic obstructive pulmonary disease, unspecified: Secondary | ICD-10-CM | POA: Diagnosis not present

## 2018-02-16 DIAGNOSIS — I5033 Acute on chronic diastolic (congestive) heart failure: Secondary | ICD-10-CM | POA: Diagnosis not present

## 2018-02-16 DIAGNOSIS — I110 Hypertensive heart disease with heart failure: Secondary | ICD-10-CM | POA: Diagnosis not present

## 2018-02-16 DIAGNOSIS — S41011D Laceration without foreign body of right shoulder, subsequent encounter: Secondary | ICD-10-CM | POA: Diagnosis not present

## 2018-02-20 DIAGNOSIS — J449 Chronic obstructive pulmonary disease, unspecified: Secondary | ICD-10-CM | POA: Diagnosis not present

## 2018-02-20 DIAGNOSIS — I110 Hypertensive heart disease with heart failure: Secondary | ICD-10-CM | POA: Diagnosis not present

## 2018-02-20 DIAGNOSIS — S41011D Laceration without foreign body of right shoulder, subsequent encounter: Secondary | ICD-10-CM | POA: Diagnosis not present

## 2018-02-20 DIAGNOSIS — I270 Primary pulmonary hypertension: Secondary | ICD-10-CM | POA: Diagnosis not present

## 2018-02-20 DIAGNOSIS — I5022 Chronic systolic (congestive) heart failure: Secondary | ICD-10-CM | POA: Diagnosis not present

## 2018-02-20 DIAGNOSIS — I5033 Acute on chronic diastolic (congestive) heart failure: Secondary | ICD-10-CM | POA: Diagnosis not present

## 2018-02-21 ENCOUNTER — Encounter: Payer: Self-pay | Admitting: Nurse Practitioner

## 2018-02-21 DIAGNOSIS — I5033 Acute on chronic diastolic (congestive) heart failure: Secondary | ICD-10-CM | POA: Diagnosis not present

## 2018-02-21 DIAGNOSIS — I5022 Chronic systolic (congestive) heart failure: Secondary | ICD-10-CM | POA: Diagnosis not present

## 2018-02-21 DIAGNOSIS — I110 Hypertensive heart disease with heart failure: Secondary | ICD-10-CM | POA: Diagnosis not present

## 2018-02-21 DIAGNOSIS — I270 Primary pulmonary hypertension: Secondary | ICD-10-CM | POA: Diagnosis not present

## 2018-02-21 DIAGNOSIS — S41011D Laceration without foreign body of right shoulder, subsequent encounter: Secondary | ICD-10-CM | POA: Diagnosis not present

## 2018-02-21 DIAGNOSIS — J449 Chronic obstructive pulmonary disease, unspecified: Secondary | ICD-10-CM | POA: Diagnosis not present

## 2018-02-22 DIAGNOSIS — I5033 Acute on chronic diastolic (congestive) heart failure: Secondary | ICD-10-CM | POA: Diagnosis not present

## 2018-02-22 DIAGNOSIS — I5022 Chronic systolic (congestive) heart failure: Secondary | ICD-10-CM | POA: Diagnosis not present

## 2018-02-22 DIAGNOSIS — J449 Chronic obstructive pulmonary disease, unspecified: Secondary | ICD-10-CM | POA: Diagnosis not present

## 2018-02-22 DIAGNOSIS — I270 Primary pulmonary hypertension: Secondary | ICD-10-CM | POA: Diagnosis not present

## 2018-02-22 DIAGNOSIS — I110 Hypertensive heart disease with heart failure: Secondary | ICD-10-CM | POA: Diagnosis not present

## 2018-02-22 DIAGNOSIS — S41011D Laceration without foreign body of right shoulder, subsequent encounter: Secondary | ICD-10-CM | POA: Diagnosis not present

## 2018-02-23 DIAGNOSIS — I110 Hypertensive heart disease with heart failure: Secondary | ICD-10-CM | POA: Diagnosis not present

## 2018-02-23 DIAGNOSIS — S41011D Laceration without foreign body of right shoulder, subsequent encounter: Secondary | ICD-10-CM | POA: Diagnosis not present

## 2018-02-23 DIAGNOSIS — I270 Primary pulmonary hypertension: Secondary | ICD-10-CM | POA: Diagnosis not present

## 2018-02-23 DIAGNOSIS — J449 Chronic obstructive pulmonary disease, unspecified: Secondary | ICD-10-CM | POA: Diagnosis not present

## 2018-02-23 DIAGNOSIS — I5033 Acute on chronic diastolic (congestive) heart failure: Secondary | ICD-10-CM | POA: Diagnosis not present

## 2018-02-23 DIAGNOSIS — I5022 Chronic systolic (congestive) heart failure: Secondary | ICD-10-CM | POA: Diagnosis not present

## 2018-02-28 DIAGNOSIS — S41011D Laceration without foreign body of right shoulder, subsequent encounter: Secondary | ICD-10-CM | POA: Diagnosis not present

## 2018-02-28 DIAGNOSIS — J449 Chronic obstructive pulmonary disease, unspecified: Secondary | ICD-10-CM | POA: Diagnosis not present

## 2018-02-28 DIAGNOSIS — I5033 Acute on chronic diastolic (congestive) heart failure: Secondary | ICD-10-CM | POA: Diagnosis not present

## 2018-02-28 DIAGNOSIS — I5022 Chronic systolic (congestive) heart failure: Secondary | ICD-10-CM | POA: Diagnosis not present

## 2018-02-28 DIAGNOSIS — I110 Hypertensive heart disease with heart failure: Secondary | ICD-10-CM | POA: Diagnosis not present

## 2018-02-28 DIAGNOSIS — I270 Primary pulmonary hypertension: Secondary | ICD-10-CM | POA: Diagnosis not present

## 2018-03-01 DIAGNOSIS — I5022 Chronic systolic (congestive) heart failure: Secondary | ICD-10-CM | POA: Diagnosis not present

## 2018-03-01 DIAGNOSIS — S41011D Laceration without foreign body of right shoulder, subsequent encounter: Secondary | ICD-10-CM | POA: Diagnosis not present

## 2018-03-01 DIAGNOSIS — I110 Hypertensive heart disease with heart failure: Secondary | ICD-10-CM | POA: Diagnosis not present

## 2018-03-01 DIAGNOSIS — I270 Primary pulmonary hypertension: Secondary | ICD-10-CM | POA: Diagnosis not present

## 2018-03-01 DIAGNOSIS — I5033 Acute on chronic diastolic (congestive) heart failure: Secondary | ICD-10-CM | POA: Diagnosis not present

## 2018-03-01 DIAGNOSIS — J449 Chronic obstructive pulmonary disease, unspecified: Secondary | ICD-10-CM | POA: Diagnosis not present

## 2018-03-02 ENCOUNTER — Encounter: Payer: Self-pay | Admitting: Nurse Practitioner

## 2018-03-02 ENCOUNTER — Non-Acute Institutional Stay: Payer: Medicare Other | Admitting: Nurse Practitioner

## 2018-03-02 VITALS — BP 100/60 | HR 73 | Resp 22 | Wt 144.0 lb

## 2018-03-02 DIAGNOSIS — I5032 Chronic diastolic (congestive) heart failure: Secondary | ICD-10-CM

## 2018-03-02 DIAGNOSIS — I517 Cardiomegaly: Secondary | ICD-10-CM | POA: Diagnosis not present

## 2018-03-02 DIAGNOSIS — J189 Pneumonia, unspecified organism: Secondary | ICD-10-CM | POA: Diagnosis not present

## 2018-03-02 DIAGNOSIS — J9611 Chronic respiratory failure with hypoxia: Secondary | ICD-10-CM

## 2018-03-02 DIAGNOSIS — Z515 Encounter for palliative care: Secondary | ICD-10-CM

## 2018-03-02 DIAGNOSIS — R0989 Other specified symptoms and signs involving the circulatory and respiratory systems: Secondary | ICD-10-CM

## 2018-03-02 NOTE — Progress Notes (Signed)
PALLIATIVE CARE CONSULT VISIT   PATIENT NAME: [name] DOB: 1930/08/17 MRN: [id number]  PRIMARY CARE PROVIDER:   Lajean Manes, MD  REFERRING PROVIDER:  Lajean Manes, MD [address] [place], [place] [address]  RESPONSIBLE PARTY:   Jill Side [phone]   CC: patient not feeling well has noted congestion and increased sputum production; patient reports increased congestion and coughing for past two days; denies increased work of breathing; chest pain; subjective fever, chills, N/V, appetite unchanged  ASSESSMENT/ PLAN:  1.chronic hypoxic respiratory failure/O2 dependent COPD/?LLL Pneumonia with component of CHF.  Portable CXR; additional 74mq of furosemide today and additional 49meQ of kcl tonight; venous doppler of right leg (DVT); azithromycin 500mg  day 1 then 250mg  daily for 4days; doxycyline 100mg  BID for 5 days.  2.Increased right leg swelling:  Venous doppler of right leg   I spent 45 minutes providing this consultation,  from 10:15 to 11:00 More than 50% of the time in this consultation was spent coordinating communication.   HISTORY OF PRESENT ILLNESS:  [name] is a 84 y.o. year old female with multiple medical problems including chronic hypoxic respiratory failure on chronic oxygen at 2L/[place]; COPD; diastolic heart failure. Palliative Care was asked to help address symptom management (see CC above).   CODE STATUS:  DNR  PPS: 40% HOSPICE ELIGIBILITY/DIAGNOSIS: TBD  PAST MEDICAL HISTORY:  Past Medical History:  Diagnosis Date  . CHB (complete heart block) ([place]) 07/13/2013   Pacemaker dependent  . CHF (congestive heart failure) ([place])   . COPD (chronic obstructive pulmonary disease) ([place])   . DM (dermatomyositis)   . Orthostatic hypotension   . Pacemaker 07/13/2013   Her original pacemaker and the current leads were implanted in 1992. She has had 2 generator change out, most recently in 2010. Her device is a Buyer, retail 2110 non  RF dual-chamber pacemaker with a battery longevity estimated at about 7 years. The atrial lead is a St. Jude [id number] and the ventricular lead was a Biotronik [id number].   [name] Paroxysmal atrial fibrillation ([place]) 09/04/2014   on Eliquis  . Polymyalgia rheumatica ([place])   . Scoliosis   . SSS (sick sinus syndrome) ([place]) 07/13/2013  . Syncope   . Systemic hypertension   . Thoracic kyphosis     SOCIAL HX:  Social History   Tobacco Use  . Smoking status: Former Smoker    Packs/day: 0.50    Years: 60.00    Pack years: 30.00    Types: Cigarettes  . Smokeless tobacco: Never Used  . Tobacco comment: quit 12/2016  Substance Use Topics  . Alcohol use: No    ALLERGIES: No Known Allergies   PERTINENT MEDICATIONS:  Outpatient Encounter Medications as of 03/02/2018  Medication Sig  . acetaminophen (TYLENOL) 325 MG tablet Take 325 mg by mouth every 6 (six) hours as needed for mild pain, moderate pain or fever.   [name] albuterol (PROVENTIL) (2.5 MG/3ML) 0.083% nebulizer solution Take 3 mLs (2.5 mg total) by nebulization every 6 (six) hours as needed for wheezing or shortness of breath.  [name] albuterol (PROVENTIL) (2.5 MG/3ML) 0.083% nebulizer solution Take 3 mLs (2.5 mg total) by nebulization 3 (three) times daily.  [name] alendronate (FOSAMAX) 70 MG tablet Take 70 mg by mouth every Saturday. Take with a full glass of water on an empty stomach.  . ALPRAZolam (XANAX) 0.25 MG tablet Take 1 tablet (0.25 mg total) at bedtime as needed by mouth for sleep.  [name] docusate  sodium (COLACE) 100 MG capsule Take 1 capsule (100 mg total) daily as needed by mouth for mild constipation.  [name] escitalopram (LEXAPRO) 10 MG tablet Take 10 mg by mouth daily.  . furosemide (LASIX) 40 MG tablet Take 0.5 tablets (20 mg total) daily by mouth. (Patient taking differently: Take 40 mg by mouth daily as needed (for weight gain greater than 2lbs). )  . guaiFENesin (MUCINEX) 600 MG 12 hr tablet Take 2 tablets (1,200 mg total) by mouth 2 (two) times daily.   [name] guaiFENesin-dextromethorphan (ROBITUSSIN DM) 100-10 MG/5ML syrup Take 10 mLs by mouth every 4 (four) hours as needed for cough.  [name] HYDROcodone-acetaminophen (NORCO/VICODIN) 5-325 MG tablet Take 1-2 tablets every 4 (four) hours as needed by mouth for moderate pain. (Patient taking differently: Take 1 tablet by mouth daily. )  . metoprolol succinate (TOPROL-XL) 25 MG 24 hr tablet Take 25 mg by mouth daily.   . midodrine (PROAMATINE) 2.5 MG tablet Take 2.5 mg by mouth as needed (when systolic BP is lower than 100).   . mirtazapine (REMERON) 15 MG tablet Take 7.5 mg by mouth at bedtime.   . ondansetron (ZOFRAN) 4 MG tablet Take 4 mg by mouth every 6 (six) hours as needed for nausea or vomiting.  . potassium chloride SA (K-DUR,KLOR-CON) 20 MEQ tablet Take 1 tablet (20 mEq total) daily by mouth. (Patient taking differently: Take 20 mEq by mouth daily as needed (with Lasix). )  . predniSONE (DELTASONE) 10 MG tablet 50mg  po qday x 2 days then 40mg  po qday x 2 days then 30mg  po qday x 2 days then 20mg  po qday x 2 days then 10mg  po qday x 2 days  . umeclidinium-vilanterol (ANORO ELLIPTA) 62.5-25 MCG/INH AEPB Inhale 1 puff into the lungs daily.   No facility-administered encounter medications on file as of 03/02/2018.     PHYSICAL EXAM:   General: NAD, frail appearing, thin Cardiovascular: regular rate and rhythm Pulmonary: resp even and unlabored; 2LNC in place;rales throughout all fields; decreased sounds in LLL Abdomen: soft, nontender, + bowel sounds GU: no suprapubic tenderness Extremities: 2+ left  LE  Edema; 3+ right LE edema; no joint deformities Skin: no rashes Neurological: Weakness but otherwise nonfocal  Stephanie G Martin[place], NP

## 2018-03-03 DIAGNOSIS — I82811 Embolism and thrombosis of superficial veins of right lower extremities: Secondary | ICD-10-CM | POA: Diagnosis not present

## 2018-03-05 NOTE — Progress Notes (Signed)
PALLIATIVE CARE CONSULT VISIT   PATIENT NAME: [name] DOB: 1931/02/04 MRN: [id number]  PRIMARY CARE PROVIDER:   Lajean Manes, MD  REFERRING PROVIDER:  Lajean Manes, MD [address] [place], [place] [address]  RESPONSIBLE PARTY:   [name] [phone]   Follow-up visit: Patient seen by PC(this writer) on 03/02/18; patient subjectively "not feeling well" course rales and increased LE edema (R>L) on exam  ASSESSMENT/RECOMMENDATIONS and PLAN:  -LLL PNA COPD; chronic O2 dependent (2l/[place])  -diastolic heart failure   -CXR with LLL PNA treated with azithromycin and doxycyline -additional oral lasix -respiratory status to baseline  Bilateral LE edema; right greater than left -Venous doppler + for DVT -oral anticoagulant started by primary care physician    I spent 30 minutes providing this consultation,  from 10:00 to 10:30. More than 50% of the time in this consultation was spent coordinating communication.   HISTORY OF PRESENT ILLNESS: Debbie Morell Winslowis a 84 y.o.year oldfemalewith multiple medical problems including O2 dependent COPD; recent Acute hypoxic respiratory failure/HCAP with + rhinovirus, CHF, Chronic atrial fibrillation, CHB s/p pacemaker,Palliative Care was asked to continue to follow patient to assist with coordination of care, symptoms management and ongoing support   CODE STATUS: DNR  PPS: 40% HOSPICE ELIGIBILITY/DIAGNOSIS: TBD  PAST MEDICAL HISTORY:  Past Medical History:  Diagnosis Date  . CHB (complete heart block) ([place]) 07/13/2013   Pacemaker dependent  . CHF (congestive heart failure) ([place])   . COPD (chronic obstructive pulmonary disease) ([place])   . DM (dermatomyositis)   . Orthostatic hypotension   . Pacemaker 07/13/2013   Her original pacemaker and the current leads were implanted in 1992. She has had 2 generator change out, most recently in 2010. Her device is a Buyer, retail 2110 non RF dual-chamber pacemaker  with a battery longevity estimated at about 7 years. The atrial lead is a St. Jude [id number] and the ventricular lead was a Biotronik [id number].   [name] Paroxysmal atrial fibrillation ([place]) 09/04/2014   on Eliquis  . Polymyalgia rheumatica ([place])   . Scoliosis   . SSS (sick sinus syndrome) ([place]) 07/13/2013  . Syncope   . Systemic hypertension   . Thoracic kyphosis     SOCIAL HX:  Social History   Tobacco Use  . Smoking status: Former Smoker    Packs/day: 0.50    Years: 60.00    Pack years: 30.00    Types: Cigarettes  . Smokeless tobacco: Never Used  . Tobacco comment: quit 12/2016  Substance Use Topics  . Alcohol use: No    ALLERGIES: No Known Allergies   PERTINENT MEDICATIONS:  Outpatient Encounter Medications as of 03/06/2018  Medication Sig  . acetaminophen (TYLENOL) 325 MG tablet Take 325 mg by mouth every 6 (six) hours as needed for mild pain, moderate pain or fever.   [name] albuterol (PROVENTIL) (2.5 MG/3ML) 0.083% nebulizer solution Take 3 mLs (2.5 mg total) by nebulization every 6 (six) hours as needed for wheezing or shortness of breath.  [name] albuterol (PROVENTIL) (2.5 MG/3ML) 0.083% nebulizer solution Take 3 mLs (2.5 mg total) by nebulization 3 (three) times daily.  [name] alendronate (FOSAMAX) 70 MG tablet Take 70 mg by mouth every Saturday. Take with a full glass of water on an empty stomach.  . ALPRAZolam (XANAX) 0.25 MG tablet Take 1 tablet (0.25 mg total) at bedtime as needed by mouth for sleep.  [name] docusate sodium (COLACE) 100 MG capsule Take 1 capsule (100 mg total)  daily as needed by mouth for mild constipation.  [name] escitalopram (LEXAPRO) 10 MG tablet Take 10 mg by mouth daily.  . furosemide (LASIX) 40 MG tablet Take 0.5 tablets (20 mg total) daily by mouth. (Patient taking differently: Take 40 mg by mouth daily as needed (for weight gain greater than 2lbs). )  . guaiFENesin (MUCINEX) 600 MG 12 hr tablet Take 2 tablets (1,200 mg total) by mouth 2 (two) times daily.  [name]  guaiFENesin-dextromethorphan (ROBITUSSIN DM) 100-10 MG/5ML syrup Take 10 mLs by mouth every 4 (four) hours as needed for cough.  [name] HYDROcodone-acetaminophen (NORCO/VICODIN) 5-325 MG tablet Take 1-2 tablets every 4 (four) hours as needed by mouth for moderate pain. (Patient taking differently: Take 1 tablet by mouth daily. )  . metoprolol succinate (TOPROL-XL) 25 MG 24 hr tablet Take 25 mg by mouth daily.   . midodrine (PROAMATINE) 2.5 MG tablet Take 2.5 mg by mouth as needed (when systolic BP is lower than 100).   . mirtazapine (REMERON) 15 MG tablet Take 7.5 mg by mouth at bedtime.   . ondansetron (ZOFRAN) 4 MG tablet Take 4 mg by mouth every 6 (six) hours as needed for nausea or vomiting.  . potassium chloride SA (K-DUR,KLOR-CON) 20 MEQ tablet Take 1 tablet (20 mEq total) daily by mouth. (Patient taking differently: Take 20 mEq by mouth daily as needed (with Lasix). )  . predniSONE (DELTASONE) 10 MG tablet 50mg  po qday x 2 days then 40mg  po qday x 2 days then 30mg  po qday x 2 days then 20mg  po qday x 2 days then 10mg  po qday x 2 days  . umeclidinium-vilanterol (ANORO ELLIPTA) 62.5-25 MCG/INH AEPB Inhale 1 puff into the lungs daily.   No facility-administered encounter medications on file as of 03/06/2018.     PHYSICAL EXAM:   General: NAD, frail appearing, thin Cardiovascular: regular rate and rhythm Pulmonary: decreased but clear throughout Abdomen: soft, nontender, + bowel sounds GU: no suprapubic tenderness Extremities: 1+ BLE edema, no joint deformities Skin: no rashes Neurological: Weakness but otherwise nonfocal  Stephanie G Martin[place], NP

## 2018-03-06 ENCOUNTER — Non-Acute Institutional Stay: Payer: Medicare Other | Admitting: Nurse Practitioner

## 2018-03-06 DIAGNOSIS — Z515 Encounter for palliative care: Secondary | ICD-10-CM

## 2018-03-06 DIAGNOSIS — R0602 Shortness of breath: Secondary | ICD-10-CM

## 2018-03-06 DIAGNOSIS — I824Y1 Acute embolism and thrombosis of unspecified deep veins of right proximal lower extremity: Secondary | ICD-10-CM

## 2018-03-06 DIAGNOSIS — I5032 Chronic diastolic (congestive) heart failure: Secondary | ICD-10-CM

## 2018-03-06 DIAGNOSIS — S41011D Laceration without foreign body of right shoulder, subsequent encounter: Secondary | ICD-10-CM | POA: Diagnosis not present

## 2018-03-06 DIAGNOSIS — J181 Lobar pneumonia, unspecified organism: Secondary | ICD-10-CM

## 2018-03-06 DIAGNOSIS — J449 Chronic obstructive pulmonary disease, unspecified: Secondary | ICD-10-CM | POA: Diagnosis not present

## 2018-03-06 DIAGNOSIS — I110 Hypertensive heart disease with heart failure: Secondary | ICD-10-CM | POA: Diagnosis not present

## 2018-03-06 DIAGNOSIS — J189 Pneumonia, unspecified organism: Secondary | ICD-10-CM

## 2018-03-06 DIAGNOSIS — I5022 Chronic systolic (congestive) heart failure: Secondary | ICD-10-CM | POA: Diagnosis not present

## 2018-03-06 DIAGNOSIS — I5033 Acute on chronic diastolic (congestive) heart failure: Secondary | ICD-10-CM | POA: Diagnosis not present

## 2018-03-06 DIAGNOSIS — I270 Primary pulmonary hypertension: Secondary | ICD-10-CM | POA: Diagnosis not present

## 2018-03-13 DIAGNOSIS — I5022 Chronic systolic (congestive) heart failure: Secondary | ICD-10-CM | POA: Diagnosis not present

## 2018-03-13 DIAGNOSIS — J449 Chronic obstructive pulmonary disease, unspecified: Secondary | ICD-10-CM | POA: Diagnosis not present

## 2018-03-13 DIAGNOSIS — I5033 Acute on chronic diastolic (congestive) heart failure: Secondary | ICD-10-CM | POA: Diagnosis not present

## 2018-03-13 DIAGNOSIS — I270 Primary pulmonary hypertension: Secondary | ICD-10-CM | POA: Diagnosis not present

## 2018-03-13 DIAGNOSIS — S41011D Laceration without foreign body of right shoulder, subsequent encounter: Secondary | ICD-10-CM | POA: Diagnosis not present

## 2018-03-13 DIAGNOSIS — I110 Hypertensive heart disease with heart failure: Secondary | ICD-10-CM | POA: Diagnosis not present

## 2018-03-15 DIAGNOSIS — I5033 Acute on chronic diastolic (congestive) heart failure: Secondary | ICD-10-CM | POA: Diagnosis not present

## 2018-03-15 DIAGNOSIS — S41011D Laceration without foreign body of right shoulder, subsequent encounter: Secondary | ICD-10-CM | POA: Diagnosis not present

## 2018-03-15 DIAGNOSIS — I270 Primary pulmonary hypertension: Secondary | ICD-10-CM | POA: Diagnosis not present

## 2018-03-15 DIAGNOSIS — J449 Chronic obstructive pulmonary disease, unspecified: Secondary | ICD-10-CM | POA: Diagnosis not present

## 2018-03-15 DIAGNOSIS — I5022 Chronic systolic (congestive) heart failure: Secondary | ICD-10-CM | POA: Diagnosis not present

## 2018-03-15 DIAGNOSIS — I110 Hypertensive heart disease with heart failure: Secondary | ICD-10-CM | POA: Diagnosis not present

## 2018-03-15 NOTE — Progress Notes (Signed)
This is a blank note please discard 

## 2018-03-17 ENCOUNTER — Emergency Department (HOSPITAL_COMMUNITY): Payer: Medicare Other

## 2018-03-17 ENCOUNTER — Encounter (HOSPITAL_COMMUNITY): Payer: Self-pay

## 2018-03-17 ENCOUNTER — Other Ambulatory Visit: Payer: Self-pay

## 2018-03-17 ENCOUNTER — Inpatient Hospital Stay (HOSPITAL_COMMUNITY)
Admission: EM | Admit: 2018-03-17 | Discharge: 2018-03-21 | DRG: 190 | Disposition: A | Discharge status: Home Health | Payer: Medicare Other | Attending: Internal Medicine | Admitting: Internal Medicine

## 2018-03-17 DIAGNOSIS — I10 Essential (primary) hypertension: Secondary | ICD-10-CM | POA: Diagnosis not present

## 2018-03-17 DIAGNOSIS — I959 Hypotension, unspecified: Secondary | ICD-10-CM | POA: Diagnosis not present

## 2018-03-17 DIAGNOSIS — I480 Paroxysmal atrial fibrillation: Secondary | ICD-10-CM | POA: Diagnosis present

## 2018-03-17 DIAGNOSIS — I442 Atrioventricular block, complete: Secondary | ICD-10-CM | POA: Diagnosis not present

## 2018-03-17 DIAGNOSIS — R0602 Shortness of breath: Secondary | ICD-10-CM | POA: Diagnosis not present

## 2018-03-17 DIAGNOSIS — I110 Hypertensive heart disease with heart failure: Secondary | ICD-10-CM | POA: Diagnosis present

## 2018-03-17 DIAGNOSIS — Z8679 Personal history of other diseases of the circulatory system: Secondary | ICD-10-CM

## 2018-03-17 DIAGNOSIS — Z66 Do not resuscitate: Secondary | ICD-10-CM | POA: Diagnosis not present

## 2018-03-17 DIAGNOSIS — T380X5A Adverse effect of glucocorticoids and synthetic analogues, initial encounter: Secondary | ICD-10-CM | POA: Diagnosis present

## 2018-03-17 DIAGNOSIS — I495 Sick sinus syndrome: Secondary | ICD-10-CM | POA: Diagnosis present

## 2018-03-17 DIAGNOSIS — Z8619 Personal history of other infectious and parasitic diseases: Secondary | ICD-10-CM

## 2018-03-17 DIAGNOSIS — R0600 Dyspnea, unspecified: Secondary | ICD-10-CM | POA: Diagnosis not present

## 2018-03-17 DIAGNOSIS — R531 Weakness: Secondary | ICD-10-CM

## 2018-03-17 DIAGNOSIS — Z8701 Personal history of pneumonia (recurrent): Secondary | ICD-10-CM

## 2018-03-17 DIAGNOSIS — R7309 Other abnormal glucose: Secondary | ICD-10-CM | POA: Diagnosis present

## 2018-03-17 DIAGNOSIS — I509 Heart failure, unspecified: Secondary | ICD-10-CM

## 2018-03-17 DIAGNOSIS — J441 Chronic obstructive pulmonary disease with (acute) exacerbation: Principal | ICD-10-CM | POA: Diagnosis present

## 2018-03-17 DIAGNOSIS — J9621 Acute and chronic respiratory failure with hypoxia: Secondary | ICD-10-CM | POA: Diagnosis present

## 2018-03-17 DIAGNOSIS — G9341 Metabolic encephalopathy: Secondary | ICD-10-CM | POA: Diagnosis not present

## 2018-03-17 DIAGNOSIS — R0689 Other abnormalities of breathing: Secondary | ICD-10-CM | POA: Diagnosis not present

## 2018-03-17 DIAGNOSIS — I82401 Acute embolism and thrombosis of unspecified deep veins of right lower extremity: Secondary | ICD-10-CM | POA: Diagnosis not present

## 2018-03-17 DIAGNOSIS — Z87891 Personal history of nicotine dependence: Secondary | ICD-10-CM

## 2018-03-17 DIAGNOSIS — Z950 Presence of cardiac pacemaker: Secondary | ICD-10-CM | POA: Diagnosis present

## 2018-03-17 DIAGNOSIS — M353 Polymyalgia rheumatica: Secondary | ICD-10-CM | POA: Diagnosis present

## 2018-03-17 DIAGNOSIS — R0902 Hypoxemia: Secondary | ICD-10-CM | POA: Diagnosis not present

## 2018-03-17 DIAGNOSIS — E876 Hypokalemia: Secondary | ICD-10-CM | POA: Diagnosis present

## 2018-03-17 DIAGNOSIS — Z7952 Long term (current) use of systemic steroids: Secondary | ICD-10-CM

## 2018-03-17 DIAGNOSIS — F411 Generalized anxiety disorder: Secondary | ICD-10-CM | POA: Diagnosis present

## 2018-03-17 DIAGNOSIS — Z7951 Long term (current) use of inhaled steroids: Secondary | ICD-10-CM

## 2018-03-17 DIAGNOSIS — Z823 Family history of stroke: Secondary | ICD-10-CM

## 2018-03-17 DIAGNOSIS — Z79891 Long term (current) use of opiate analgesic: Secondary | ICD-10-CM

## 2018-03-17 DIAGNOSIS — I5033 Acute on chronic diastolic (congestive) heart failure: Secondary | ICD-10-CM | POA: Diagnosis present

## 2018-03-17 DIAGNOSIS — I5043 Acute on chronic combined systolic (congestive) and diastolic (congestive) heart failure: Secondary | ICD-10-CM | POA: Diagnosis not present

## 2018-03-17 DIAGNOSIS — Z87311 Personal history of (healed) other pathological fracture: Secondary | ICD-10-CM

## 2018-03-17 DIAGNOSIS — I2510 Atherosclerotic heart disease of native coronary artery without angina pectoris: Secondary | ICD-10-CM | POA: Diagnosis present

## 2018-03-17 DIAGNOSIS — Z9981 Dependence on supplemental oxygen: Secondary | ICD-10-CM

## 2018-03-17 DIAGNOSIS — Z79899 Other long term (current) drug therapy: Secondary | ICD-10-CM

## 2018-03-17 DIAGNOSIS — Z7983 Long term (current) use of bisphosphonates: Secondary | ICD-10-CM

## 2018-03-17 DIAGNOSIS — Z09 Encounter for follow-up examination after completed treatment for conditions other than malignant neoplasm: Secondary | ICD-10-CM

## 2018-03-17 LAB — I-STAT VENOUS BLOOD GAS, ED
Acid-Base Excess: 5 mmol/L — ABNORMAL HIGH (ref 0.0–2.0)
Bicarbonate: 32.6 mmol/L — ABNORMAL HIGH (ref 20.0–28.0)
O2 SAT: 64 %
PCO2 VEN: 57.3 mmHg (ref 44.0–60.0)
PO2 VEN: 36 mmHg (ref 32.0–45.0)
TCO2: 34 mmol/L — AB (ref 22–32)
pH, Ven: 7.363 (ref 7.250–7.430)

## 2018-03-17 LAB — COMPREHENSIVE METABOLIC PANEL
ALBUMIN: 3.4 g/dL — AB (ref 3.5–5.0)
ALT: 9 U/L (ref 0–44)
AST: 20 U/L (ref 15–41)
Alkaline Phosphatase: 80 U/L (ref 38–126)
Anion gap: 9 (ref 5–15)
BUN: 14 mg/dL (ref 8–23)
CHLORIDE: 99 mmol/L (ref 98–111)
CO2: 31 mmol/L (ref 22–32)
Calcium: 8.9 mg/dL (ref 8.9–10.3)
Creatinine, Ser: 1.02 mg/dL — ABNORMAL HIGH (ref 0.44–1.00)
GFR calc Af Amer: 56 mL/min — ABNORMAL LOW (ref >60)
GFR calc non Af Amer: 49 mL/min — ABNORMAL LOW (ref >60)
GLUCOSE: 109 mg/dL — AB (ref 70–99)
POTASSIUM: 4 mmol/L (ref 3.5–5.1)
Sodium: 139 mmol/L (ref 135–145)
Total Bilirubin: 1 mg/dL (ref 0.3–1.2)
Total Protein: 6.5 g/dL (ref 6.5–8.1)

## 2018-03-17 LAB — CBC WITH DIFFERENTIAL/PLATELET
ABS IMMATURE GRANULOCYTES: 0.1 10*3/uL (ref 0.0–0.1)
BASOS PCT: 1 %
Basophils Absolute: 0.1 10*3/uL (ref 0.0–0.1)
Eosinophils Absolute: 0.1 10*3/uL (ref 0.0–0.7)
Eosinophils Relative: 1 %
HEMATOCRIT: 41.1 % (ref 36.0–46.0)
Hemoglobin: 12.1 g/dL (ref 12.0–15.0)
Immature Granulocytes: 1 %
LYMPHS PCT: 15 %
Lymphs Abs: 2.3 10*3/uL (ref 0.7–4.0)
MCH: 27.9 pg (ref 26.0–34.0)
MCHC: 29.4 g/dL — ABNORMAL LOW (ref 30.0–36.0)
MCV: 94.9 fL (ref 78.0–100.0)
MONOS PCT: 7 %
Monocytes Absolute: 1.2 10*3/uL — ABNORMAL HIGH (ref 0.1–1.0)
NEUTROS ABS: 12.2 10*3/uL — AB (ref 1.7–7.7)
Neutrophils Relative %: 75 %
PLATELETS: 375 10*3/uL (ref 150–400)
RBC: 4.33 MIL/uL (ref 3.87–5.11)
RDW: 15.9 % — ABNORMAL HIGH (ref 11.5–15.5)
WBC: 16.1 10*3/uL — ABNORMAL HIGH (ref 4.0–10.5)

## 2018-03-17 LAB — URINALYSIS, ROUTINE W REFLEX MICROSCOPIC
BACTERIA UA: NONE SEEN
Bilirubin Urine: NEGATIVE
GLUCOSE, UA: NEGATIVE mg/dL
Ketones, ur: 5 mg/dL — AB
Leukocytes, UA: NEGATIVE
Nitrite: NEGATIVE
PROTEIN: NEGATIVE mg/dL
Specific Gravity, Urine: 1.011 (ref 1.005–1.030)
pH: 5 (ref 5.0–8.0)

## 2018-03-17 LAB — I-STAT CG4 LACTIC ACID, ED
LACTIC ACID, VENOUS: 1.25 mmol/L (ref 0.5–1.9)
Lactic Acid, Venous: 2.05 mmol/L (ref 0.5–1.9)

## 2018-03-17 LAB — PROTIME-INR
INR: 3.68
Prothrombin Time: 36.3 seconds — ABNORMAL HIGH (ref 11.4–15.2)

## 2018-03-17 LAB — BRAIN NATRIURETIC PEPTIDE: B Natriuretic Peptide: 695.9 pg/mL — ABNORMAL HIGH (ref 0.0–100.0)

## 2018-03-17 LAB — I-STAT TROPONIN, ED: Troponin i, poc: 0.02 ng/mL (ref 0.00–0.08)

## 2018-03-17 MED ORDER — HYDROCODONE-ACETAMINOPHEN 5-325 MG PO TABS
1.0000 | ORAL_TABLET | ORAL | Status: DC | PRN
  Administered 2018-03-17 – 2018-03-20 (×8): 1 via ORAL
  Filled 2018-03-17 (×8): qty 1

## 2018-03-17 MED ORDER — PREDNISONE 10 MG PO TABS: 10.0000 mg | ORAL_TABLET | Freq: Every day | ORAL | Status: DC

## 2018-03-17 MED ORDER — ESCITALOPRAM OXALATE 10 MG PO TABS
10.0000 mg | ORAL_TABLET | Freq: Every day | ORAL | Status: DC
  Administered 2018-03-17 – 2018-03-21 (×5): 10 mg via ORAL
  Filled 2018-03-17 (×5): qty 1

## 2018-03-17 MED ORDER — ALBUTEROL SULFATE (2.5 MG/3ML) 0.083% IN NEBU: 2.5000 mg | INHALATION_SOLUTION | Freq: Three times a day (TID) | RESPIRATORY_TRACT | Status: DC

## 2018-03-17 MED ORDER — IPRATROPIUM BROMIDE 0.02 % IN SOLN
0.5000 mg | Freq: Once | RESPIRATORY_TRACT | Status: AC
  Administered 2018-03-17: 0.5 mg via RESPIRATORY_TRACT
  Filled 2018-03-17: qty 2.5

## 2018-03-17 MED ORDER — ALPRAZOLAM 0.25 MG PO TABS
0.2500 mg | ORAL_TABLET | Freq: Every evening | ORAL | Status: DC | PRN
  Administered 2018-03-17 – 2018-03-20 (×4): 0.25 mg via ORAL
  Filled 2018-03-17 (×4): qty 1

## 2018-03-17 MED ORDER — SODIUM CHLORIDE 0.9 % IV SOLN
500.0000 mg | INTRAVENOUS | Status: AC
  Administered 2018-03-17: 500 mg via INTRAVENOUS
  Filled 2018-03-17: qty 500

## 2018-03-17 MED ORDER — PREDNISONE 20 MG PO TABS
40.0000 mg | ORAL_TABLET | Freq: Every day | ORAL | Status: AC
  Administered 2018-03-19 – 2018-03-20 (×2): 40 mg via ORAL
  Filled 2018-03-17 (×2): qty 2

## 2018-03-17 MED ORDER — RIVAROXABAN 15 MG PO TABS
15.0000 mg | ORAL_TABLET | Freq: Two times a day (BID) | ORAL | Status: DC
  Administered 2018-03-17 – 2018-03-21 (×8): 15 mg via ORAL
  Filled 2018-03-17 (×8): qty 1

## 2018-03-17 MED ORDER — UMECLIDINIUM-VILANTEROL 62.5-25 MCG/INH IN AEPB
1.0000 | INHALATION_SPRAY | Freq: Every day | RESPIRATORY_TRACT | Status: DC
  Administered 2018-03-18 – 2018-03-21 (×4): 1 via RESPIRATORY_TRACT
  Filled 2018-03-17: qty 14

## 2018-03-17 MED ORDER — MIRTAZAPINE 15 MG PO TABS
7.5000 mg | ORAL_TABLET | Freq: Every day | ORAL | Status: DC
  Administered 2018-03-17 – 2018-03-20 (×4): 7.5 mg via ORAL
  Filled 2018-03-17 (×4): qty 1

## 2018-03-17 MED ORDER — RIVAROXABAN 15 MG PO TABS: 15.0000 mg | ORAL_TABLET | Freq: Two times a day (BID) | ORAL | Status: DC

## 2018-03-17 MED ORDER — MIDODRINE HCL 5 MG PO TABS: 2.5000 mg | ORAL_TABLET | Freq: Three times a day (TID) | ORAL | Status: DC | PRN

## 2018-03-17 MED ORDER — AZITHROMYCIN 250 MG PO TABS
500.0000 mg | ORAL_TABLET | Freq: Every day | ORAL | Status: AC
  Administered 2018-03-18 – 2018-03-21 (×4): 500 mg via ORAL
  Filled 2018-03-17 (×5): qty 2

## 2018-03-17 MED ORDER — FUROSEMIDE 10 MG/ML IJ SOLN
40.0000 mg | Freq: Once | INTRAMUSCULAR | Status: AC
  Administered 2018-03-17: 40 mg via INTRAVENOUS
  Filled 2018-03-17: qty 4

## 2018-03-17 MED ORDER — PREDNISONE 20 MG PO TABS: 30.0000 mg | ORAL_TABLET | Freq: Every day | ORAL | Status: DC

## 2018-03-17 MED ORDER — POLYETHYLENE GLYCOL 3350 17 G PO PACK: 17.0000 g | PACK | Freq: Every day | ORAL | Status: DC | PRN

## 2018-03-17 MED ORDER — RIVAROXABAN 20 MG PO TABS: 20.0000 mg | ORAL_TABLET | Freq: Every day | ORAL | Status: DC

## 2018-03-17 MED ORDER — PREDNISONE 20 MG PO TABS
50.0000 mg | ORAL_TABLET | Freq: Every day | ORAL | Status: AC
  Administered 2018-03-17 – 2018-03-18 (×2): 50 mg via ORAL
  Filled 2018-03-17 (×2): qty 2

## 2018-03-17 MED ORDER — DOCUSATE SODIUM 100 MG PO CAPS: 100.0000 mg | ORAL_CAPSULE | Freq: Every day | ORAL | Status: DC | PRN

## 2018-03-17 MED ORDER — ALENDRONATE SODIUM 70 MG PO TABS: 70.0000 mg | ORAL_TABLET | ORAL | Status: DC

## 2018-03-17 MED ORDER — PREDNISONE 20 MG PO TABS: 40.0000 mg | ORAL_TABLET | Freq: Every day | ORAL | Status: DC

## 2018-03-17 MED ORDER — ONDANSETRON HCL 4 MG PO TABS
4.0000 mg | ORAL_TABLET | Freq: Four times a day (QID) | ORAL | Status: DC | PRN
  Administered 2018-03-19 – 2018-03-20 (×2): 4 mg via ORAL
  Filled 2018-03-17 (×2): qty 1

## 2018-03-17 MED ORDER — METOPROLOL SUCCINATE ER 25 MG PO TB24
25.0000 mg | ORAL_TABLET | Freq: Every day | ORAL | Status: DC
  Administered 2018-03-17 – 2018-03-21 (×5): 25 mg via ORAL
  Filled 2018-03-17 (×5): qty 1

## 2018-03-17 MED ORDER — ALBUTEROL (5 MG/ML) CONTINUOUS INHALATION SOLN
5.0000 mg/h | INHALATION_SOLUTION | Freq: Once | RESPIRATORY_TRACT | Status: AC
  Administered 2018-03-17: 5 mg/h via RESPIRATORY_TRACT
  Filled 2018-03-17: qty 20

## 2018-03-17 MED ORDER — PREDNISONE 20 MG PO TABS: 20.0000 mg | ORAL_TABLET | Freq: Every day | ORAL | Status: DC

## 2018-03-17 MED ORDER — SODIUM CHLORIDE 0.9% FLUSH
3.0000 mL | Freq: Two times a day (BID) | INTRAVENOUS | Status: DC
  Administered 2018-03-17 – 2018-03-21 (×8): 3 mL via INTRAVENOUS

## 2018-03-17 MED ORDER — FUROSEMIDE 40 MG PO TABS
40.0000 mg | ORAL_TABLET | Freq: Every day | ORAL | Status: DC
  Administered 2018-03-18 – 2018-03-21 (×4): 40 mg via ORAL
  Filled 2018-03-17 (×4): qty 1

## 2018-03-17 MED ORDER — GUAIFENESIN ER 600 MG PO TB12
1200.0000 mg | ORAL_TABLET | Freq: Two times a day (BID) | ORAL | Status: DC
  Administered 2018-03-17 – 2018-03-21 (×8): 1200 mg via ORAL
  Filled 2018-03-17 (×8): qty 2

## 2018-03-17 MED ORDER — ALBUTEROL SULFATE (2.5 MG/3ML) 0.083% IN NEBU
2.5000 mg | INHALATION_SOLUTION | Freq: Three times a day (TID) | RESPIRATORY_TRACT | Status: DC
  Administered 2018-03-17 – 2018-03-21 (×13): 2.5 mg via RESPIRATORY_TRACT
  Filled 2018-03-17 (×13): qty 3

## 2018-03-17 NOTE — H&P (Signed)
History and Physical    [name] BLT:[id number] DOB: 02-Oct-1930 DOA: 03/17/2018  PCP: Lajean Manes, MD  Patient coming from: Home  Chief Complaint: SOB  HPI: [name] is a 84 y.o. female with medical history significant of HTN, SSS, afib with pacemaker in place, PMR on chronic steroids (recently 10 mg daily), COPD, combined CHF (most recent EF of 40-45%) who presents with acute SOB.  [name] reports that she was doing well, went to bed feeling in her normal state of health, but awoke this morning and had increasing cough and SOB.  She notes that she was coughing so hard that she could not get her breath.  She felt like she was going to pass out, but she did not lose consciousness.  She did not have a change in sputum or increase in sputum.  She is bringing up her same clear sputum without hemoptysis, but she felt like she was having a harder time bringing it up. She is taking her inhalers and CHF medications without issue.  She is on chronic oxygen at 2L.  She has not had any sick contacts and notes that she does not "mingle" with the other residents at her ALF.  She has had a mild decrease in her appetite, but no increase in pain, myalgias, chest pain. She has had no fever or chills, and no increase in wheezing.  She does not have abdominal pain, nausea or vomiting.  She notes that her weight has been stable at 141 - 143 #.    I reviewed the MAR from her ALF and it appears that on 9/13 she had an event happen. CXR and [place] was done.  She was started on a course of Abx (Azithromycin and doxycycline) and started on Xarelto.  I do not have records of any of the imaging.  Patient reports that a few weeks ago she developed swelling in her right leg, no pain. [place] was done and showed a clot and she was started on xarelto.    ED Course: In the ED, she was noted to be on BIPAP which was started in the field.  She was doing well and asking to be off the mask.  Her BNP was 695 which is actually  lower than previous. She had a Cr of 1.02 which was very mildly up from baseline around 0.8.  She had a WBC of 16.1, up from 11. Lactic acid was 2.  TnI was 0.02 (no chest pain).  EKG with V paced rhythm. CXR With mild pulmonary congestion.  LLL with atelectasis, possibly bronchiolitis.   Review of Systems: As per HPI otherwise 10 point review of systems negative.   Past Medical History:  Diagnosis Date  . CHB (complete heart block) ([place]) 07/13/2013   Pacemaker dependent  . CHF (congestive heart failure) ([place])   . COPD (chronic obstructive pulmonary disease) ([place])   . DM (dermatomyositis)   . Orthostatic hypotension   . Pacemaker 07/13/2013   Her original pacemaker and the current leads were implanted in 1992. She has had 2 generator change out, most recently in 2010. Her device is a Buyer, retail 2110 non RF dual-chamber pacemaker with a battery longevity estimated at about 7 years. The atrial lead is a St. Jude [id number] and the ventricular lead was a Biotronik [id number].   [name] Paroxysmal atrial fibrillation ([place]) 09/04/2014   on Eliquis  . Polymyalgia rheumatica ([place])   . Scoliosis   . SSS (sick sinus syndrome) ([place])  07/13/2013  . Syncope   . Systemic hypertension   . Thoracic kyphosis     Past Surgical History:  Procedure Laterality Date  . IR KYPHO THORACIC WITH BONE BIOPSY  01/04/2017  . IR RADIOLOGIST EVAL & MGMT  01/10/2017  . NM MYOCAR PERF WALL MOTION  09/13/2011   Low risk  . PERMANENT PACEMAKER GENERATOR CHANGE  03/27/2009   [employer].Jude  . PPM GENERATOR CHANGEOUT N/A 08/09/2017   Procedure: PPM GENERATOR CHANGEOUT;  Surgeon: Sanda Klein, MD;  Location: [facility];  Service: Cardiovascular;  Laterality: N/A;  . US ECHOCARDIOGRAPHY  03/28/2012   Mod LAE,mild MR,aortic sclerosis w/mod AI,mod. TR,mild PI,Stage I diastolic dysfunction  . VIDEO BRONCHOSCOPY Bilateral 10/07/2016   Procedure: VIDEO BRONCHOSCOPY WITH FLUORO;  Surgeon: Marshell Garfinkel, MD;  Location: [facility];   Service: Cardiopulmonary;  Laterality: Bilateral;   Reviewed with patient.   reports that she has quit smoking. Her smoking use included cigarettes. She has a 30.00 pack-year smoking history. She has never used smokeless tobacco. She reports that she does not drink alcohol or use drugs.  No Known Allergies  Reviewed, stated dad was healthy.  Family History  Problem Relation Age of Onset  . Stroke Mother    MAR from ALF reviewed.  Prior to Admission medications   Medication Sig Start Date End Date Taking? Authorizing Provider  acetaminophen (TYLENOL) 325 MG tablet Take 325 mg by mouth every 6 (six) hours as needed for mild pain, moderate pain or fever.    Yes [provider]  albuterol (PROVENTIL) (2.5 MG/3ML) 0.083% nebulizer solution Take 3 mLs (2.5 mg total) by nebulization 3 (three) times daily. 11/24/17  Yes Jani Gravel, MD  alendronate (FOSAMAX) 70 MG tablet Take 70 mg by mouth every Wednesday. Take with a full glass of water on an empty stomach.    Yes [provider]  ALPRAZolam (XANAX) 0.25 MG tablet Take 1 tablet (0.25 mg total) at bedtime as needed by mouth for sleep. 04/25/17  Yes Bonnielee Haff, MD  escitalopram (LEXAPRO) 10 MG tablet Take 10 mg by mouth daily.   Yes [provider]  furosemide (LASIX) 40 MG tablet Take 0.5 tablets (20 mg total) daily by mouth. Patient taking differently: Take 20 mg by mouth daily as needed (for over 2lbs weight increase).  04/25/17  Yes Bonnielee Haff, MD  furosemide (LASIX) 40 MG tablet Take 40 mg by mouth daily.   Yes [provider]  guaiFENesin (MUCINEX) 600 MG 12 hr tablet Take 2 tablets (1,200 mg total) by mouth 2 (two) times daily. 11/24/17  Yes Jani Gravel, MD  guaiFENesin-dextromethorphan (ROBITUSSIN DM) 100-10 MG/5ML syrup Take 10 mLs by mouth every 4 (four) hours as needed for cough. 11/24/17  Yes Jani Gravel, MD  HYDROcodone-acetaminophen (NORCO/VICODIN) 5-325 MG tablet Take 1-2 tablets every 4 (four) hours  as needed by mouth for moderate pain. Patient taking differently: Take 1 tablet by mouth every 4 (four) hours as needed for moderate pain.  04/25/17  Yes Bonnielee Haff, MD  metoprolol succinate (TOPROL-XL) 25 MG 24 hr tablet Take 25 mg by mouth daily.    Yes [provider]  midodrine (PROAMATINE) 2.5 MG tablet Take 2.5 mg by mouth as needed (when systolic BP is lower than 100).    Yes [provider]  mirtazapine (REMERON) 15 MG tablet Take 7.5 mg by mouth at bedtime.    Yes [provider]  ondansetron (ZOFRAN) 4 MG tablet Take 4 mg by mouth every  6 (six) hours as needed for nausea or vomiting.   Yes [provider]  OXYGEN Inhale 2 L into the lungs continuous.   Yes [provider]  potassium chloride SA (K-DUR,KLOR-CON) 20 MEQ tablet Take 1 tablet (20 mEq total) daily by mouth. Patient taking differently: Take 20 mEq by mouth daily as needed (with Lasix).  04/25/17  Yes Bonnielee Haff, MD  predniSONE (DELTASONE) 10 MG tablet 50mg  po qday x 2 days then 40mg  po qday x 2 days then 30mg  po qday x 2 days then 20mg  po qday x 2 days then 10mg  po qday x 2 days Patient taking differently: Take 10 mg by mouth daily.  11/24/17  Yes Jani Gravel, MD  Rivaroxaban (XARELTO) 15 MG TABS tablet Take 15 mg by mouth 2 (two) times daily with a meal. Take for 21 days 03/03/18 03/31/18 Yes [provider]  umeclidinium-vilanterol (ANORO ELLIPTA) 62.5-25 MCG/INH AEPB Inhale 1 puff into the lungs daily. 08/16/17  Yes Mannam, Praveen, MD  docusate sodium (COLACE) 100 MG capsule Take 1 capsule (100 mg total) daily as needed by mouth for mild constipation. Patient not taking: Reported on 03/17/2018 04/25/17   Bonnielee Haff, MD    Physical Exam: Constitutional: Lying in bed, BIPAP mask in place, NAD Vitals:   03/17/18 1546 03/17/18 1638 03/17/18 1728 03/17/18 1731  BP: (!) 110/45 (!) 113/49 123/70 123/75  Pulse: 72  69 69  Resp: 16  (!) 22   Temp:   98.7 F (37.1 C)  98.7 F (37.1 C)  TempSrc:   Oral Oral  SpO2: 97%  99% 99%  Weight:   64.8 kg   Height:   5\' 6"  (1.676 m)    Eyes:  lids and conjunctivae normal ENMT: Mucous membranes are dry Neck: normal, supple Respiratory: Course breath sounds throughout, no wheezing noted, minimal crackles at bases, on BIPAP mask.  Cardiovascular: RR, NR, difficult to assess murmur due to BIPAP noise.  PCM in place in right upper chest, no tenderness or erythema at site.  Abdomen: +BS, NT, ND  Musculoskeletal: no clubbing / cyanosis. Minimal LE swelling.  + TTP over both lower extremities with palpation.  Skin: She has 2 skin tears on right upper arm and left forearm.  Very thin skin, ecchymoses on arms and legs.  She has a hard nodule on abdomen which is black and mobile, superficial in nature.  About the size of a small acorn. She notes it has been there a while.  Neurologic: Grossly intact, moving all extremities easily.  Psychiatric: Normal judgment and insight. Alert and oriented x 3. Normal mood.   Labs on Admission: I have personally reviewed following labs and imaging studies  CBC: Recent Labs  Lab 03/17/18 0957  WBC 16.1*  NEUTROABS 12.2*  HGB 12.1  HCT 41.1  MCV 94.9  PLT 409   Basic Metabolic Panel: Recent Labs  Lab 03/17/18 0957  NA 139  K 4.0  CL 99  CO2 31  GLUCOSE 109*  BUN 14  CREATININE 1.02*  CALCIUM 8.9   GFR: Estimated Creatinine Clearance: 37.7 mL/min (A) (by C-G formula based on SCr of 1.02 mg/dL (H)). Liver Function Tests: Recent Labs  Lab 03/17/18 0957  AST 20  ALT 9  ALKPHOS 80  BILITOT 1.0  PROT 6.5  ALBUMIN 3.4*   No results for input(s): LIPASE, AMYLASE in the last 168 hours. No results for input(s): AMMONIA in the last 168 hours. Coagulation Profile: Recent Labs  Lab 03/17/18 0957  INR 3.68   Cardiac Enzymes: No results for input(s): CKTOTAL, CKMB, CKMBINDEX, TROPONINI in the last 168 hours. BNP (last 3 results) No results for input(s): PROBNP in  the last 8760 hours. HbA1C: No results for input(s): HGBA1C in the last 72 hours. CBG: No results for input(s): GLUCAP in the last 168 hours. Lipid Profile: No results for input(s): CHOL, HDL, LDLCALC, TRIG, CHOLHDL, LDLDIRECT in the last 72 hours. Thyroid Function Tests: No results for input(s): TSH, T4TOTAL, FREET4, T3FREE, THYROIDAB in the last 72 hours. Anemia Panel: No results for input(s): VITAMINB12, FOLATE, FERRITIN, TIBC, IRON, RETICCTPCT in the last 72 hours. Urine analysis:    Component Value Date/Time   COLORURINE YELLOW 03/17/2018 [place] 03/17/2018 1252   LABSPEC 1.011 03/17/2018 1252   PHURINE 5.0 03/17/2018 1252   GLUCOSEU NEGATIVE 03/17/2018 1252   HGBUR SMALL (A) 03/17/2018 1252   BILIRUBINUR NEGATIVE 03/17/2018 1252   KETONESUR 5 (A) 03/17/2018 1252   PROTEINUR NEGATIVE 03/17/2018 1252   UROBILINOGEN 1.0 01/19/2011 1030   NITRITE NEGATIVE 03/17/2018 1252   LEUKOCYTESUR NEGATIVE 03/17/2018 1252    Radiological Exams on Admission: Dg Chest Port 1 View  Result Date: 03/17/2018 CLINICAL DATA:  Shortness of breath this morning. Wheezing and rales. On BiPAP. EXAM: PORTABLE CHEST 1 VIEW COMPARISON:  01/22/2018 and CT chest 11/20/2017. FINDINGS: Trachea is midline. Heart is enlarged. Thoracic aorta is calcified. Pacemaker lead tips project over the right atrium and right ventricle. Small left pleural effusion with interstitial accentuation in the left lower lobe. There may be peripheral septal lines the right lung base. IMPRESSION: 1. Question mild congestive heart failure. 2. Left lower lobe subsegmental atelectasis. Difficult to exclude an infectious bronchiolitis. Electronically Signed   By: Lorin Picket M.D.   On: 03/17/2018 10:47    EKG: Independently reviewed. V paced  Assessment/Plan COPD exacerbation, mild - Unclear cause, possibly a viral infection.  She improved quickly on the bipap and was requesting to go home.  Given severity of her  initial presentation, she agree to stay and be monitored overnight - Increase chronic steroids from 10mg  to 40mg  - Azithromycin for 5 days - Oxygen to keep O2 Saturation between 88 - 92%, requiring slightly more than home dose at time of admission (3L) - Continue home inhalers: Anoro, scheduled albuterol - Albuterol PRN - Lasix X 1 given in ED  RLE clot - Continue Xarelto 15mg  BID - she is still on starter pack    CHB (complete heart block) with Pacemaker  - Pacemaker in place, EKG appears unchanged - Continue home metoprolol dosing    Hypertension - BP doing better, 123/75 - Monitor - continue home metoprolol  PMR - On chronic steroids 10mg  prednisone daily - Increase for acute exacerbation of COPD as noted above - Continue home hydrocodone dosing for pain  Combined CHF, chronic - Lasix IV X 1 given in the ED - I do not think she is in exacerbation - weight is stable, CXR mild changes - Continue home medications which include daily lasix, metoprolol   DVT prophylaxis: Xarelto Code Status: DNR, confirmed with patient Disposition Plan: Admit for observation.  If doing well, can likely go home tomorrow Consults called: None Admission status: Med surg, obs   Gilles Chiquito MD [facility] Pager [phone]  If 7PM-7AM, please contact night-coverage [email or web address] Password [facility]  03/17/2018, 5:36 PM

## 2018-03-17 NOTE — Progress Notes (Signed)

## 2018-03-17 NOTE — Progress Notes (Signed)
RT NOTE: Patient arrived from EMS with shortness of breath on cpap. RT placed patient on bipap after taking dentures out. RN is aware. Patient states that she is comfortable on bipap. RT will continue to monitor.

## 2018-03-17 NOTE — Progress Notes (Signed)
RT NOTE: MD asked to try patient off bipap. Patient sating 92% on 3L [place]. RR 19. Patient says she is feeling much better and is not feeling any shortness of breath. RT will continue to monitor.

## 2018-03-17 NOTE — ED Notes (Signed)
Pt. On purewick has not provided sample yet.

## 2018-03-17 NOTE — ED Notes (Signed)
MD aware of lower BP, no orders at this time, pt is asymptomatic

## 2018-03-17 NOTE — ED Triage Notes (Addendum)
Pt presents for evaluation of SOB this morning. [facility]. On CPAP in route, given 5 mg albuterol and 125 mg solumedrol. EMS reported wheezing and rales PTA. Pt reports generalized pain. DNR at bedside.

## 2018-03-17 NOTE — ED Provider Notes (Signed)
[facility] EMERGENCY DEPARTMENT Provider Note   CSN: [id number] Arrival date & time: 03/17/18  9147     History   Chief Complaint Chief Complaint  Patient presents with  . Shortness of Breath    HPI [name] is a 84 y.o. female.  HPI Patient has history of COPD, congestive heart failure, coronary artery disease and multiple other medical problems.  Patient reports that she started to get quite short of breath this morning.  She went to bed yesterday evening feeling basically at her baseline.  She reports she does chronically have cough.  Usually she has to cough a few times and clears for little while. she has not had recent fever.  Patient chronically has swelling of the lower extremities but is improved at this time. Past Medical History:  Diagnosis Date  . CHB (complete heart block) ([place]) 07/13/2013   Pacemaker dependent  . CHF (congestive heart failure) ([place])   . COPD (chronic obstructive pulmonary disease) ([place])   . DM (dermatomyositis)   . Orthostatic hypotension   . Pacemaker 07/13/2013   Her original pacemaker and the current leads were implanted in 1992. She has had 2 generator change out, most recently in 2010. Her device is a Buyer, retail 2110 non RF dual-chamber pacemaker with a battery longevity estimated at about 7 years. The atrial lead is a St. Jude [id number] and the ventricular lead was a Biotronik [id number].   [name] Paroxysmal atrial fibrillation ([place]) 09/04/2014   on Eliquis  . Polymyalgia rheumatica ([place])   . Scoliosis   . SSS (sick sinus syndrome) ([place]) 07/13/2013  . Syncope   . Systemic hypertension   . Thoracic kyphosis     Patient Active Problem List   Diagnosis Date Noted  . Right middle lobe pneumonia ([place]) 11/20/2017  . Elective replacement indicated for cardiac pacemaker battery at end of lifespan 08/09/2017  . Chronic pulmonary aspiration 05/02/2017  . Encounter for palliative care   . Goals of care, counseling/discussion   .  Shortness of breath   . Stress fracture of thoracic vertebra 03/19/2017  . Dementia 03/19/2017  . Non-traumatic compression fracture of T9 thoracic vertebra 01/02/2017  . Polymyalgia rheumatica ([place])   . Postural dizziness with near syncope   . Syncope 10/11/2016  . Postural dizziness with presyncope 10/11/2016  . Acute hypoxemic respiratory failure ([place]) 10/05/2016  . Chronic diastolic CHF (congestive heart failure) ([place]) 10/05/2016  . Smoker 10/05/2016  . Closed multiple fractures of right upper extremity with ribs with routine healing 10/05/2016  . HCAP (healthcare-associated pneumonia) 10/31/2015  . COPD (chronic obstructive pulmonary disease) ([place]) 10/31/2015  . Leukocytosis 10/31/2015  . Benign essential HTN 10/31/2015  . Anxiety state   . COPD exacerbation ([place]) 08/26/2015  . Systemic hypertension 08/26/2015  . Hypokalemia 08/26/2015  . Anticoagulant long-term use 08/26/2015  . DM (dermatomyositis) 08/26/2015  . Acute on chronic diastolic CHF (congestive heart failure), NYHA class 1 ([place])   . Sepsis ([place]) 06/07/2015  . CAP (community acquired pneumonia) 06/07/2015  . Acute CHF (congestive heart failure) ([place]) 06/07/2015  . Paroxysmal atrial fibrillation ([place]) 09/04/2014  . Pacemaker 07/13/2013  . CHB (complete heart block) ([place]) 07/13/2013  . SSS (sick sinus syndrome) ([place]) 07/13/2013  . Orthostatic hypotension 07/13/2013  . Aortic insufficiency 07/13/2013    Past Surgical History:  Procedure Laterality Date  . IR KYPHO THORACIC WITH BONE BIOPSY  01/04/2017  . IR RADIOLOGIST EVAL & MGMT  01/10/2017  . NM MYOCAR PERF  WALL MOTION  09/13/2011   Low risk  . PERMANENT PACEMAKER GENERATOR CHANGE  03/27/2009   St.Jude  . PPM GENERATOR CHANGEOUT N/A 08/09/2017   Procedure: PPM GENERATOR CHANGEOUT;  Surgeon: Sanda Klein, MD;  Location: [facility];  Service: Cardiovascular;  Laterality: N/A;  . US ECHOCARDIOGRAPHY  03/28/2012   Mod LAE,mild MR,aortic sclerosis w/mod  AI,mod. TR,mild PI,Stage I diastolic dysfunction  . VIDEO BRONCHOSCOPY Bilateral 10/07/2016   Procedure: VIDEO BRONCHOSCOPY WITH FLUORO;  Surgeon: Marshell Garfinkel, MD;  Location: [facility];  Service: Cardiopulmonary;  Laterality: Bilateral;     OB History   None      Home Medications    Prior to Admission medications   Medication Sig Start Date End Date Taking? Authorizing Provider  acetaminophen (TYLENOL) 325 MG tablet Take 325 mg by mouth every 6 (six) hours as needed for mild pain, moderate pain or fever.    Yes [provider]  albuterol (PROVENTIL) (2.5 MG/3ML) 0.083% nebulizer solution Take 3 mLs (2.5 mg total) by nebulization 3 (three) times daily. 11/24/17  Yes Jani Gravel, MD  alendronate (FOSAMAX) 70 MG tablet Take 70 mg by mouth every Wednesday. Take with a full glass of water on an empty stomach.    Yes [provider]  ALPRAZolam (XANAX) 0.25 MG tablet Take 1 tablet (0.25 mg total) at bedtime as needed by mouth for sleep. 04/25/17  Yes Bonnielee Haff, MD  escitalopram (LEXAPRO) 10 MG tablet Take 10 mg by mouth daily.   Yes [provider]  furosemide (LASIX) 40 MG tablet Take 0.5 tablets (20 mg total) daily by mouth. Patient taking differently: Take 20 mg by mouth daily as needed (for over 2lbs weight increase).  04/25/17  Yes Bonnielee Haff, MD  furosemide (LASIX) 40 MG tablet Take 40 mg by mouth daily.   Yes [provider]  guaiFENesin (MUCINEX) 600 MG 12 hr tablet Take 2 tablets (1,200 mg total) by mouth 2 (two) times daily. 11/24/17  Yes Jani Gravel, MD  guaiFENesin-dextromethorphan (ROBITUSSIN DM) 100-10 MG/5ML syrup Take 10 mLs by mouth every 4 (four) hours as needed for cough. 11/24/17  Yes Jani Gravel, MD  HYDROcodone-acetaminophen (NORCO/VICODIN) 5-325 MG tablet Take 1-2 tablets every 4 (four) hours as needed by mouth for moderate pain. Patient taking differently: Take 1 tablet by mouth every 4 (four) hours as needed for moderate pain.   04/25/17  Yes Bonnielee Haff, MD  metoprolol succinate (TOPROL-XL) 25 MG 24 hr tablet Take 25 mg by mouth daily.    Yes [provider]  midodrine (PROAMATINE) 2.5 MG tablet Take 2.5 mg by mouth as needed (when systolic BP is lower than 100).    Yes [provider]  mirtazapine (REMERON) 15 MG tablet Take 7.5 mg by mouth at bedtime.    Yes [provider]  ondansetron (ZOFRAN) 4 MG tablet Take 4 mg by mouth every 6 (six) hours as needed for nausea or vomiting.   Yes [provider]  OXYGEN Inhale 2 L into the lungs continuous.   Yes [provider]  potassium chloride SA (K-DUR,KLOR-CON) 20 MEQ tablet Take 1 tablet (20 mEq total) daily by mouth. Patient taking differently: Take 20 mEq by mouth daily as needed (with Lasix).  04/25/17  Yes Bonnielee Haff, MD  predniSONE (DELTASONE) 10 MG tablet 50mg  po qday x 2 days then 40mg  po qday x 2 days then 30mg  po qday x 2 days then 20mg  po qday x 2 days then 10mg  po  qday x 2 days Patient taking differently: Take 10 mg by mouth daily.  11/24/17  Yes Jani Gravel, MD  Rivaroxaban (XARELTO) 15 MG TABS tablet Take 15 mg by mouth 2 (two) times daily with a meal. Take for 21 days 03/03/18 03/31/18 Yes [provider]  umeclidinium-vilanterol (ANORO ELLIPTA) 62.5-25 MCG/INH AEPB Inhale 1 puff into the lungs daily. 08/16/17  Yes Mannam, Praveen, MD  docusate sodium (COLACE) 100 MG capsule Take 1 capsule (100 mg total) daily as needed by mouth for mild constipation. Patient not taking: Reported on 03/17/2018 04/25/17   Bonnielee Haff, MD    Family History Family History  Problem Relation Age of Onset  . Stroke Mother     Social History Social History   Tobacco Use  . Smoking status: Former Smoker    Packs/day: 0.50    Years: 60.00    Pack years: 30.00    Types: Cigarettes  . Smokeless tobacco: Never Used  . Tobacco comment: quit 12/2016  Substance Use Topics  . Alcohol use: No  . Drug use: No      Allergies   Patient has no known allergies.   Review of Systems Review of Systems 10 Systems reviewed and are negative for acute change except as noted in the HPI.   Physical Exam Updated Vital Signs BP (!) 143/63   Pulse 77   Resp 20   SpO2 94%   Physical Exam  Constitutional: She is oriented to person, place, and time.  Patient is brought into the emergency department on BiPAP.  She is alert with no signs of somnolence.  BiPAP was removed to conduct interview.  Moderate increased work of breathing.  Speaking in full sentences.  HENT:  Head: Normocephalic and atraumatic.  Mouth/Throat: Oropharynx is clear and moist.  Eyes: EOM are normal.  Cardiovascular: Normal rate, regular rhythm, normal heart sounds and intact distal pulses.  Pulmonary/Chest:  Moderate increased work of breathing.  Coarse wheeze and rhonchi bilateral lung fields.  Fair airflow to the lower\mid lung fields  Abdominal: Soft. She exhibits no distension. There is no tenderness. There is no guarding.  Musculoskeletal:  Patient has about 1+ edema.  Skin shows crenulation's suggesting more recently significant edema.  Skin is slightly thinned and fragile with senile ecchymosis.  Patient can spontaneously move bilateral lower extremities to help and repositioning.  Neurological: She is alert and oriented to person, place, and time. No cranial nerve deficit. She exhibits normal muscle tone. Coordination normal.  Skin: Skin is warm and dry.  Psychiatric: She has a normal mood and affect.     ED Treatments / Results  Labs (all labs ordered are listed, but only abnormal results are displayed) Labs Reviewed  COMPREHENSIVE METABOLIC PANEL - Abnormal; Notable for the following components:      Result Value   Glucose, Bld 109 (*)    Creatinine, Ser 1.02 (*)    Albumin 3.4 (*)    GFR calc non Af Amer 49 (*)    GFR calc Af Amer 56 (*)    All other components within normal limits  BRAIN NATRIURETIC PEPTIDE -  Abnormal; Notable for the following components:   B Natriuretic Peptide 695.9 (*)    All other components within normal limits  CBC WITH DIFFERENTIAL/PLATELET - Abnormal; Notable for the following components:   WBC 16.1 (*)    MCHC 29.4 (*)    RDW 15.9 (*)    Neutro Abs 12.2 (*)    Monocytes Absolute 1.2 (*)  All other components within normal limits  PROTIME-INR - Abnormal; Notable for the following components:   Prothrombin Time 36.3 (*)    All other components within normal limits  I-STAT CG4 LACTIC ACID, ED - Abnormal; Notable for the following components:   Lactic Acid, Venous 2.05 (*)    All other components within normal limits  I-STAT VENOUS BLOOD GAS, ED - Abnormal; Notable for the following components:   Bicarbonate 32.6 (*)    TCO2 34 (*)    Acid-Base Excess 5.0 (*)    All other components within normal limits  URINE CULTURE  CULTURE, BLOOD (ROUTINE X 2)  CULTURE, BLOOD (ROUTINE X 2)  URINALYSIS, ROUTINE W REFLEX MICROSCOPIC  BLOOD GAS, VENOUS  I-STAT TROPONIN, ED  I-STAT CG4 LACTIC ACID, ED    EKG EKG Interpretation  Date/Time:  Saturday March 17 2018 09:45:41 EDT Ventricular Rate:  74 PR Interval:    QRS Duration: 172 QT Interval:  422 QTC Calculation: 469 R Axis:   -101 Text Interpretation:  Ventricular-paced rhythm No further analysis attempted due to paced rhythm agree. no change Confirmed by Charlesetta Shanks [phone]) on 03/17/2018 12:23:45 PM   Radiology Dg Chest Port 1 View  Result Date: 03/17/2018 CLINICAL DATA:  Shortness of breath this morning. Wheezing and rales. On BiPAP. EXAM: PORTABLE CHEST 1 VIEW COMPARISON:  01/22/2018 and CT chest 11/20/2017. FINDINGS: Trachea is midline. Heart is enlarged. Thoracic aorta is calcified. Pacemaker lead tips project over the right atrium and right ventricle. Small left pleural effusion with interstitial accentuation in the left lower lobe. There may be peripheral septal lines the right lung base. IMPRESSION:  1. Question mild congestive heart failure. 2. Left lower lobe subsegmental atelectasis. Difficult to exclude an infectious bronchiolitis. Electronically Signed   By: Lorin Picket M.D.   On: 03/17/2018 10:47    Procedures Procedures (including critical care time) CRITICAL CARE Performed by: Charlesetta Shanks   Total critical care time: 30  minutes  Critical care time was exclusive of separately billable procedures and treating other patients.  Critical care was necessary to treat or prevent imminent or life-threatening deterioration.  Critical care was time spent personally by me on the following activities: development of treatment plan with patient and/or surrogate as well as nursing, discussions with consultants, evaluation of patient's response to treatment, examination of patient, obtaining history from patient or surrogate, ordering and performing treatments and interventions, ordering and review of laboratory studies, ordering and review of radiographic studies, pulse oximetry and re-evaluation of patient's condition. Medications Ordered in ED Medications  furosemide (LASIX) injection 40 mg (has no administration in time range)  albuterol (PROVENTIL,VENTOLIN) solution continuous neb (5 mg/hr Nebulization Given 03/17/18 1017)  ipratropium (ATROVENT) nebulizer solution 0.5 mg (0.5 mg Nebulization Given 03/17/18 1017)     Initial Impression / Assessment and Plan / ED Course  I have reviewed the triage vital signs and the nursing notes.  Pertinent labs & imaging results that were available during my care of the patient were reviewed by me and considered in my medical decision making (see chart for details).  Clinical Course as of Mar 17 1230  Sat Mar 17, 2018  1221 Patient seems improved on BiPAP.  She is speaking in full sentences.  Will transition to nasal cannula for trial off BiPAP.   [MP]    Clinical Course User Index [MP] Charlesetta Shanks, MD    Consult: Reviewed with [facility] for admission.  Presents with shortness of breath that has  gotten worse fairly acutely.  She has multiple chronic underlying medical conditions.  Patient has had recent diagnosis of pneumonia.  She also has DVT and is therapeutic on INR.  At this time however symptoms are most suggestive of COPD exacerbation with coarse breath sounds and also some degree of CHF with vascular congestion and mildly elevated BNP.  Patient has been treated with DuoNeb, BiPAP and Lasix in the ED.  Solu-Medrol administered by EMS on route.  Patient has shown improvement.  Will trial off BiPAP and plan for admission.  Have had a phone conversation with patient's daughter who anticipates being here in about 2 hours.  She has been updated of the patient's condition and plan  Final Clinical Impressions(s) / ED Diagnoses   Final diagnoses:  COPD exacerbation ([place])  Acute on chronic congestive heart failure, unspecified heart failure type [facility])    ED Discharge Orders    None       Charlesetta Shanks, MD 03/17/18 1233

## 2018-03-18 DIAGNOSIS — I824Z9 Acute embolism and thrombosis of unspecified deep veins of unspecified distal lower extremity: Secondary | ICD-10-CM

## 2018-03-18 DIAGNOSIS — J441 Chronic obstructive pulmonary disease with (acute) exacerbation: Secondary | ICD-10-CM | POA: Diagnosis not present

## 2018-03-18 DIAGNOSIS — I10 Essential (primary) hypertension: Secondary | ICD-10-CM | POA: Diagnosis not present

## 2018-03-18 DIAGNOSIS — J9621 Acute and chronic respiratory failure with hypoxia: Secondary | ICD-10-CM | POA: Diagnosis not present

## 2018-03-18 LAB — BASIC METABOLIC PANEL
Anion gap: 10 (ref 5–15)
BUN: 15 mg/dL (ref 8–23)
CO2: 28 mmol/L (ref 22–32)
CREATININE: 0.79 mg/dL (ref 0.44–1.00)
Calcium: 8.6 mg/dL — ABNORMAL LOW (ref 8.9–10.3)
Chloride: 99 mmol/L (ref 98–111)
GFR calc Af Amer: >60 mL/min (ref >60)
GLUCOSE: 147 mg/dL — AB (ref 70–99)
Potassium: 4.1 mmol/L (ref 3.5–5.1)
SODIUM: 137 mmol/L (ref 135–145)

## 2018-03-18 LAB — URINE CULTURE

## 2018-03-18 LAB — CBC
HCT: 38.5 % (ref 36.0–46.0)
Hemoglobin: 11.7 g/dL — ABNORMAL LOW (ref 12.0–15.0)
MCH: 27.9 pg (ref 26.0–34.0)
MCHC: 30.4 g/dL (ref 30.0–36.0)
MCV: 91.9 fL (ref 78.0–100.0)
PLATELETS: 292 10*3/uL (ref 150–400)
RBC: 4.19 MIL/uL (ref 3.87–5.11)
RDW: 15.7 % — AB (ref 11.5–15.5)
WBC: 19.8 10*3/uL — ABNORMAL HIGH (ref 4.0–10.5)

## 2018-03-18 NOTE — Evaluation (Addendum)
Physical Therapy Evaluation Patient Details Name: [name] MRN: [id number] DOB: 23-Jan-1931 Today's Date: 03/18/2018   History of Present Illness  Pt is a 84 y.o. F with significant PMH of HTN, SSS, afib with pacemaker in place, PMR on chronic steroids, COPD, combined CHF who presents with acute SOB.  Clinical Impression  Pt admitted with above diagnosis. Pt currently with functional limitations due to the deficits listed below (see PT Problem List). Prior to admission, patient lives alone at an ALF, performs ADL's independently, and ambulates using a walker. Currently, appears close to baseline with functional mobility. Ambulating in room with walker and supervision; SpO2 98% on 2L O2 and DOE 2/4. Will follow acutely to progress mobility. No PT follow up or equipment recommended.     Follow Up Recommendations Supervision for mobility/OOB;No PT follow up (return to ALF)    Equipment Recommendations  None recommended by PT    Recommendations for Other Services       Precautions / Restrictions Precautions Precautions: Fall Restrictions Weight Bearing Restrictions: No      Mobility  Bed Mobility Overal bed mobility: Modified Independent                Transfers Overall transfer level: Needs assistance Equipment used: Rolling walker (2 wheeled) Transfers: Sit to/from Stand Sit to Stand: Supervision         General transfer comment: Increased time   Ambulation/Gait Ambulation/Gait assistance: Supervision   Assistive device: Rolling walker (2 wheeled) Gait Pattern/deviations: Step-through pattern;Decreased stride length Gait velocity: decreased Gait velocity interpretation: <1.8 ft/sec, indicate of risk for recurrent falls General Gait Details: Ambulatory steps from bed to recliner with walker and supervision for safety. No overt LOB  Stairs            Wheelchair Mobility    Modified Rankin (Stroke Patients Only)       Balance Overall balance  assessment: Mild deficits observed, not formally tested                                           Pertinent Vitals/Pain Pain Assessment: No/denies pain    Home Living Family/patient expects to be discharged to:: Assisted living               Home Equipment: Walker - 2 wheels;Cane - single point;Bedside commode Additional Comments: [place] [facility]    Prior Function Level of Independence: Independent with assistive device(s)         Comments: uses RW or cane for ambulation, walks independently to dining hall, has supervision for showers however performs her own ADLs     Hand Dominance        Extremity/Trunk Assessment   Upper Extremity Assessment Upper Extremity Assessment: Overall WFL for tasks assessed    Lower Extremity Assessment Lower Extremity Assessment: Overall WFL for tasks assessed       Communication   Communication: No difficulties  Cognition Arousal/Alertness: Awake/alert Behavior During Therapy: WFL for tasks assessed/performed Overall Cognitive Status: Within Functional Limits for tasks assessed                                 General Comments: For mobility assessment; A&Ox3      General Comments General comments (skin integrity, edema, etc.): skin tears on arms from "scratching with her fingernails."  Exercises     Assessment/Plan    PT Assessment Patient needs continued PT services  PT Problem List Decreased strength;Decreased activity tolerance;Decreased balance;Decreased mobility;Cardiopulmonary status limiting activity       PT Treatment Interventions DME instruction;Gait training;Functional mobility training;Therapeutic activities;Therapeutic exercise;Balance training;Patient/family education    PT Goals (Current goals can be found in the Care Plan section)  Acute Rehab PT Goals Patient Stated Goal: "go home." PT Goal Formulation: With patient Time For Goal Achievement: 04/01/18 Potential to  Achieve Goals: Good    Frequency Min 3X/week   Barriers to discharge        Co-evaluation               AM-PAC PT "6 Clicks" Daily Activity  Outcome Measure Difficulty turning over in bed (including adjusting bedclothes, sheets and blankets)?: None Difficulty moving from lying on back to sitting on the side of the bed? : None Difficulty sitting down on and standing up from a chair with arms (e.g., wheelchair, bedside commode, etc,.)?: A Little Help needed moving to and from a bed to chair (including a wheelchair)?: A Little Help needed walking in hospital room?: A Little Help needed climbing 3-5 steps with a railing? : A Lot 6 Click Score: 19    End of Session Equipment Utilized During Treatment: Gait belt;Oxygen Activity Tolerance: Patient tolerated treatment well Patient left: in chair;with call bell/phone within reach;with chair alarm set Nurse Communication: Mobility status PT Visit Diagnosis: Unsteadiness on feet (R26.81);Difficulty in walking, not elsewhere classified (R26.2)    Time: 9622-2979 PT Time Calculation (min) (ACUTE ONLY): 25 min   Charges:   PT Evaluation $PT Eval Moderate Complexity: 1 Mod PT Treatments $Therapeutic Activity: 8-22 mins       Ellamae Sia, PT, DPT Acute Rehabilitation Services Pager [phone] Office [phone]  [name] 03/18/2018, 9:45 AM

## 2018-03-18 NOTE — Progress Notes (Signed)
[facility] PROGRESS NOTE  DEICY RUSK NKN:[id number] DOB: Feb 28, 1931 DOA: 03/17/2018  PCP: Lajean Manes, MD  Brief History/Interval Summary: 84 y.o. female with medical history significant of HTN, SSS, afib with pacemaker in place, PMR on chronic steroids (recently 10 mg daily), COPD, combined CHF (most recent EF of 40-45%) who presented with acute SOB.    Patient was hospitalized a few months ago for pneumonia.  Recently diagnosed with the pneumonia at assisted living facility and was treated with antibiotics.  According to the daughter patient has had a decline since her hospitalization few months ago.  She was also recently diagnosed with a DVT and was started on Xarelto.  She was thought to have COPD exacerbation.  Initially required BiPAP.  Hospitalized for further management.  Reason for Visit: Acute COPD exacerbation  Consultants: None  Procedures: None  Antibiotics: Zithromax  Subjective/Interval History: Patient is a very poor historian.  States that she is feeling better compared to yesterday but still appears to be short of breath.  She denies any chest pain nausea or vomiting.  Denies any cough.  ROS: Denies any headaches  Objective:  Vital Signs  Vitals:   03/17/18 2318 03/18/18 0744 03/18/18 0916 03/18/18 0918  BP: 134/70 (!) 150/69    Pulse: 77 77    Resp: 20 15    Temp: 98.5 F (36.9 C) 98.2 F (36.8 C)    TempSrc: Oral Oral    SpO2:  94% 97% 97%  Weight:      Height:        Intake/Output Summary (Last 24 hours) at 03/18/2018 1154 Last data filed at 03/18/2018 0858 Gross per 24 hour  Intake 503 ml  Output 1650 ml  Net -1147 ml   Filed Weights   03/17/18 1728  Weight: 64.8 kg    General appearance: alert, cooperative, appears stated age, distracted and no distress Head: Normocephalic, without obvious abnormality, atraumatic Resp: Coarse breath sounds bilaterally.  Tachypneic at rest.  Some use of accessory muscle while she is  talking.  Wheezing heard bilaterally.  Few crackles at the bases. Cardio: regular rate and rhythm, S1, S2 normal, no murmur, click, rub or gallop GI: soft, non-tender; bowel sounds normal; no masses,  no organomegaly Extremities: extremities normal, atraumatic, no cyanosis or edema Pulses: 2+ and symmetric Neurologic: Awake alert.  Distracted.  No obvious focal neurological deficits.  Lab Results:  Data Reviewed: I have personally reviewed following labs and imaging studies  CBC: Recent Labs  Lab 03/17/18 0957 03/18/18 0410  WBC 16.1* 19.8*  NEUTROABS 12.2*  --   HGB 12.1 11.7*  HCT 41.1 38.5  MCV 94.9 91.9  PLT 375 379    Basic Metabolic Panel: Recent Labs  Lab 03/17/18 0957 03/18/18 0410  NA 139 137  K 4.0 4.1  CL 99 99  CO2 31 28  GLUCOSE 109* 147*  BUN 14 15  CREATININE 1.02* 0.79  CALCIUM 8.9 8.6*    GFR: Estimated Creatinine Clearance: 48.1 mL/min (by C-G formula based on SCr of 0.79 mg/dL).  Liver Function Tests: Recent Labs  Lab 03/17/18 0957  AST 20  ALT 9  ALKPHOS 80  BILITOT 1.0  PROT 6.5  ALBUMIN 3.4*    Coagulation Profile: Recent Labs  Lab 03/17/18 0957  INR 3.68     Recent Results (from the past 240 hour(s))  Urine culture     Status: Abnormal   Collection Time: 03/17/18  9:53 AM  Result Value Ref Range Status  Specimen Description URINE, RANDOM  Final   Special Requests NONE  Final   Culture (A)  Final    <10,000 COLONIES/mL INSIGNIFICANT GROWTH Performed at [facility], [place] [address] [place], [place] [address]    Report Status 03/18/2018 FINAL  Final      Radiology Studies: Dg Chest Port 1 View  Result Date: 03/17/2018 CLINICAL DATA:  Shortness of breath this morning. Wheezing and rales. On BiPAP. EXAM: PORTABLE CHEST 1 VIEW COMPARISON:  01/22/2018 and CT chest 11/20/2017. FINDINGS: Trachea is midline. Heart is enlarged. Thoracic aorta is calcified. Pacemaker lead tips project over the right atrium and right  ventricle. Small left pleural effusion with interstitial accentuation in the left lower lobe. There may be peripheral septal lines the right lung base. IMPRESSION: 1. Question mild congestive heart failure. 2. Left lower lobe subsegmental atelectasis. Difficult to exclude an infectious bronchiolitis. Electronically Signed   By: Lorin Picket M.D.   On: 03/17/2018 10:47     Medications:  Scheduled: . albuterol  2.5 mg Nebulization TID  . azithromycin  500 mg Oral Daily  . escitalopram  10 mg Oral Daily  . furosemide  40 mg Oral Daily  . guaiFENesin  1,200 mg Oral BID  . metoprolol succinate  25 mg Oral Daily  . mirtazapine  7.5 mg Oral QHS  . predniSONE  50 mg Oral Q supper   Followed by  . [START ON 03/19/2018] predniSONE  40 mg Oral Q supper   Followed by  . [START ON 03/21/2018] predniSONE  30 mg Oral Q supper   Followed by  . [START ON 03/23/2018] predniSONE  20 mg Oral Q supper   Followed by  . [START ON 03/25/2018] predniSONE  10 mg Oral Q supper  . rivaroxaban  15 mg Oral BID WC   Followed by  . [START ON 03/24/2018] rivaroxaban  20 mg Oral Q supper  . sodium chloride flush  3 mL Intravenous Q12H  . umeclidinium-vilanterol  1 puff Inhalation Daily   Continuous:  QZR:[id number], docusate sodium, HYDROcodone-acetaminophen, midodrine, ondansetron, polyethylene glycol  Assessment/Plan:    Acute COPD exacerbation Possibly exacerbated by viral infection.  No pneumonia noted on chest x-ray.  Patient was initially placed on BiPAP but this was discontinued in the emergency department.  Still appears to be dyspneic.  Does not appear to be close to her baseline yet.  Continue with the nebulizer treatments.  Continue with systemic steroids.  Azithromycin.  She uses oxygen at home on a chronic basis.  Continue with inhalers.    Acute on chronic respiratory failure with hypoxia Initially required BiPAP.  Respiratory status has improved but she still remains tachypneic.  Continue with her  oxygen.  She is on home O2.  Recent right lower extremity DVT Noted to be on Xarelto.  History of complete heart block status post pacemaker Stable.  Essential hypertension Monitor blood pressures closely.  Continue home medications.  History of polymyalgia rheumatica She is on chronic steroids for the same.  Dose has been increased due to COPD exacerbation.  She is on hydrocodone for pain.  Leukocytosis likely due to steroids.  Chronic combined systolic and diastolic CHF Echocardiogram from April 2018 showed EF to be 45%.  She is on diuretics which is been continued.  She was given IV Lasix x1 in the ED.  Does not appear to be overtly volume overloaded.  Not noted to be on ARB or ACE inhibitors.  Will defer this to outpatient providers.  DVT Prophylaxis: On Xarelto    Code Status: DNR Family Communication: Discussed with the patient and her daughter Disposition Plan: Management as outlined above.  Mobilize.    LOS: 0 days   [facility] Pager [phone] 03/18/2018, 11:54 AM  If 7PM-7AM, please contact night-coverage at [email or web address], password [facility]

## 2018-03-19 DIAGNOSIS — I480 Paroxysmal atrial fibrillation: Secondary | ICD-10-CM | POA: Diagnosis present

## 2018-03-19 DIAGNOSIS — Z9981 Dependence on supplemental oxygen: Secondary | ICD-10-CM | POA: Diagnosis not present

## 2018-03-19 DIAGNOSIS — T380X5A Adverse effect of glucocorticoids and synthetic analogues, initial encounter: Secondary | ICD-10-CM | POA: Diagnosis present

## 2018-03-19 DIAGNOSIS — I110 Hypertensive heart disease with heart failure: Secondary | ICD-10-CM | POA: Diagnosis present

## 2018-03-19 DIAGNOSIS — I10 Essential (primary) hypertension: Secondary | ICD-10-CM | POA: Diagnosis not present

## 2018-03-19 DIAGNOSIS — Z823 Family history of stroke: Secondary | ICD-10-CM | POA: Diagnosis not present

## 2018-03-19 DIAGNOSIS — I2510 Atherosclerotic heart disease of native coronary artery without angina pectoris: Secondary | ICD-10-CM | POA: Diagnosis present

## 2018-03-19 DIAGNOSIS — Z8701 Personal history of pneumonia (recurrent): Secondary | ICD-10-CM | POA: Diagnosis not present

## 2018-03-19 DIAGNOSIS — Z950 Presence of cardiac pacemaker: Secondary | ICD-10-CM | POA: Diagnosis not present

## 2018-03-19 DIAGNOSIS — Z66 Do not resuscitate: Secondary | ICD-10-CM | POA: Diagnosis present

## 2018-03-19 DIAGNOSIS — I495 Sick sinus syndrome: Secondary | ICD-10-CM | POA: Diagnosis present

## 2018-03-19 DIAGNOSIS — Z87311 Personal history of (healed) other pathological fracture: Secondary | ICD-10-CM | POA: Diagnosis not present

## 2018-03-19 DIAGNOSIS — Z8619 Personal history of other infectious and parasitic diseases: Secondary | ICD-10-CM | POA: Diagnosis not present

## 2018-03-19 DIAGNOSIS — J441 Chronic obstructive pulmonary disease with (acute) exacerbation: Secondary | ICD-10-CM | POA: Diagnosis present

## 2018-03-19 DIAGNOSIS — R531 Weakness: Secondary | ICD-10-CM | POA: Diagnosis not present

## 2018-03-19 DIAGNOSIS — M353 Polymyalgia rheumatica: Secondary | ICD-10-CM | POA: Diagnosis present

## 2018-03-19 DIAGNOSIS — Z87891 Personal history of nicotine dependence: Secondary | ICD-10-CM | POA: Diagnosis not present

## 2018-03-19 DIAGNOSIS — J9621 Acute and chronic respiratory failure with hypoxia: Secondary | ICD-10-CM | POA: Diagnosis present

## 2018-03-19 DIAGNOSIS — Z09 Encounter for follow-up examination after completed treatment for conditions other than malignant neoplasm: Secondary | ICD-10-CM | POA: Diagnosis not present

## 2018-03-19 DIAGNOSIS — F411 Generalized anxiety disorder: Secondary | ICD-10-CM | POA: Diagnosis present

## 2018-03-19 DIAGNOSIS — I82401 Acute embolism and thrombosis of unspecified deep veins of right lower extremity: Secondary | ICD-10-CM | POA: Diagnosis present

## 2018-03-19 DIAGNOSIS — R0602 Shortness of breath: Secondary | ICD-10-CM | POA: Diagnosis not present

## 2018-03-19 DIAGNOSIS — E876 Hypokalemia: Secondary | ICD-10-CM | POA: Diagnosis present

## 2018-03-19 DIAGNOSIS — G9341 Metabolic encephalopathy: Secondary | ICD-10-CM | POA: Diagnosis not present

## 2018-03-19 DIAGNOSIS — R7309 Other abnormal glucose: Secondary | ICD-10-CM | POA: Diagnosis present

## 2018-03-19 DIAGNOSIS — Z8679 Personal history of other diseases of the circulatory system: Secondary | ICD-10-CM | POA: Diagnosis not present

## 2018-03-19 DIAGNOSIS — Z7952 Long term (current) use of systemic steroids: Secondary | ICD-10-CM | POA: Diagnosis not present

## 2018-03-19 DIAGNOSIS — I5043 Acute on chronic combined systolic (congestive) and diastolic (congestive) heart failure: Secondary | ICD-10-CM | POA: Diagnosis not present

## 2018-03-19 LAB — BASIC METABOLIC PANEL
ANION GAP: 10 (ref 5–15)
BUN: 17 mg/dL (ref 8–23)
CALCIUM: 8.7 mg/dL — AB (ref 8.9–10.3)
CHLORIDE: 97 mmol/L — AB (ref 98–111)
CO2: 32 mmol/L (ref 22–32)
Creatinine, Ser: 0.78 mg/dL (ref 0.44–1.00)
GFR calc non Af Amer: >60 mL/min (ref >60)
GLUCOSE: 136 mg/dL — AB (ref 70–99)
Potassium: 4.1 mmol/L (ref 3.5–5.1)
Sodium: 139 mmol/L (ref 135–145)

## 2018-03-19 LAB — CBC
HEMATOCRIT: 38.2 % (ref 36.0–46.0)
Hemoglobin: 11.5 g/dL — ABNORMAL LOW (ref 12.0–15.0)
MCH: 28 pg (ref 26.0–34.0)
MCHC: 30.1 g/dL (ref 30.0–36.0)
MCV: 93.2 fL (ref 78.0–100.0)
PLATELETS: 322 10*3/uL (ref 150–400)
RBC: 4.1 MIL/uL (ref 3.87–5.11)
RDW: 15.9 % — AB (ref 11.5–15.5)
WBC: 23.7 10*3/uL — AB (ref 4.0–10.5)

## 2018-03-19 MED ORDER — ENSURE ENLIVE PO LIQD
237.0000 mL | ORAL | Status: DC
  Administered 2018-03-19 – 2018-03-20 (×2): 237 mL via ORAL

## 2018-03-19 MED ORDER — FUROSEMIDE 10 MG/ML IJ SOLN
40.0000 mg | Freq: Once | INTRAMUSCULAR | Status: AC
  Administered 2018-03-19: 40 mg via INTRAVENOUS
  Filled 2018-03-19: qty 4

## 2018-03-19 NOTE — Progress Notes (Signed)
PROGRESS NOTE    [name]  EUM:[id number] DOB: [birth year] DOA: 03/17/2018 PCP: Lajean Manes, MD   Brief Narrative:  84 y.o.femalewith medical history significant ofHTN, SSS, afib with pacemaker in place, PMR on chronic steroids (recently 10 mg daily), COPD, combined CHF (most recent EF of 40-45%) who presented with acute SOB.   Patient was hospitalized a few months ago for pneumonia.  Recently diagnosed with the pneumonia at assisted living facility and was treated with antibiotics.  According to the daughter patient has had a decline since her hospitalization few months ago.  She was also recently diagnosed with a DVT and was started on Xarelto.  She was thought to have COPD exacerbation.  Initially required BiPAP.  Hospitalized for further management.  Assessment & Plan:   Active Problems:   Pacemaker   CHB (complete heart block) (HCC)   COPD exacerbation (HCC)   Systemic hypertension   Acute COPD exacerbation  COPD on chronic O2 2 L by [place] Possibly exacerbated by viral infection.   No pneumonia noted on chest x-ray (though LLL atelectasis, "difficult to exclude infectious bronchiolitis"   Patient was initially placed on BiPAP, but this was discontinued in the emergency department.   At this time, still appears to have room for continued improvement, but no wheezing.  She has slightly increased WOB and coarse breath sounds.   Continue steroid taper, azithromycin, prn and scheduled nebs  Acute on chronic respiratory failure with hypoxia Initially required BiPAP.  Respiratory status has improved, but still with mildly increased WOB and coarse breath sounds.  At baseline O2.  Acute Encephalopathy: improved, per daughter, pt confused yesterday and pt reported being confused this morning as well, but on reevaluation, she seems to be improving.  Likely 2/2 acute illness and hospitalization.  Continue to monitor.  Check VBG.  Recent right lower extremity DVT Noted to be on  Xarelto.  History of complete heart block status post pacemaker Stable.  Essential hypertension Monitor blood pressures closely.  Continue home medications.  History of polymyalgia rheumatica She is on chronic steroids for the same.  Dose has been increased due to COPD exacerbation.  She is on hydrocodone for pain.  Leukocytosis likely due to steroids.  Chronic combined systolic and diastolic CHF Echocardiogram from April 2018 showed EF to be 45%.  She is on diuretics which is been continued.  She was given IV Lasix x1 in the ED.  CXR notable for possible mild CHF, but appears euvolemic on exam.  Not noted to be on ARB or ACE inhibitors.  Will defer this to outpatient providers  DVT prophylaxis: xarelto Code Status: DNR Family Communication: daughter at bedside and on phone Disposition Plan: likely within 24 hours   Consultants:   none  Procedures:   none  Antimicrobials:  Anti-infectives (From admission, onward)   Start     Dose/Rate Route Frequency Ordered Stop   03/18/18 1800  azithromycin (ZITHROMAX) tablet 500 mg     500 mg Oral Daily 03/17/18 1722 03/22/18 0959   03/17/18 1800  azithromycin (ZITHROMAX) 500 mg in sodium chloride 0.9 % 250 mL IVPB     500 mg 250 mL/hr over 60 Minutes Intravenous Every 24 hours 03/17/18 1722 03/17/18 2000         Subjective: A&Ox3 on repeat evaluation. Improved confusion. On first evaluation, pt notes she's "so confused".  "as to where and why".  Breathing seems better.  Objective: Vitals:   03/19/18 0500 03/19/18 0805 03/19/18 5400 03/19/18 8676  BP:  128/75    Pulse:  70    Resp:      Temp:  98.2 F (36.8 C)    TempSrc:  Oral    SpO2:  99% 93% 95%  Weight: 64.7 kg     Height:        Intake/Output Summary (Last 24 hours) at 03/19/2018 1320 Last data filed at 03/19/2018 0300 Gross per 24 hour  Intake 370 ml  Output 500 ml  Net -130 ml   Filed Weights   03/17/18 1728 03/19/18 0500  Weight: 64.8 kg 64.7 kg     Examination:  General exam: Appears calm and comfortable  Respiratory system: coarse breath sounds bilaterally, slightly increased wob, no appreciated wheezing Cardiovascular system: S1 & S2 heard, RRR. Gastrointestinal system: Abdomen is nondistended, soft and nontender.  Central nervous system: Alert and oriented. No focal neurological deficits. Extremities: Symmetric 5 x 5 power. Skin: No rashes, lesions or ulcers Psychiatry: Judgement and insight appear normal. Mood & affect appropriate.     Data Reviewed: I have personally reviewed following labs and imaging studies  CBC: Recent Labs  Lab 03/17/18 0957 03/18/18 0410 03/19/18 0338  WBC 16.1* 19.8* 23.7*  NEUTROABS 12.2*  --   --   HGB 12.1 11.7* 11.5*  HCT 41.1 38.5 38.2  MCV 94.9 91.9 93.2  PLT 375 292 626   Basic Metabolic Panel: Recent Labs  Lab 03/17/18 0957 03/18/18 0410 03/19/18 0338  NA 139 137 139  K 4.0 4.1 4.1  CL 99 99 97*  CO2 31 28 32  GLUCOSE 109* 147* 136*  BUN 14 15 17   CREATININE 1.02* 0.79 0.78  CALCIUM 8.9 8.6* 8.7*   GFR: Estimated Creatinine Clearance: 48.1 mL/min (by C-G formula based on SCr of 0.78 mg/dL). Liver Function Tests: Recent Labs  Lab 03/17/18 0957  AST 20  ALT 9  ALKPHOS 80  BILITOT 1.0  PROT 6.5  ALBUMIN 3.4*   No results for input(s): LIPASE, AMYLASE in the last 168 hours. No results for input(s): AMMONIA in the last 168 hours. Coagulation Profile: Recent Labs  Lab 03/17/18 0957  INR 3.68   Cardiac Enzymes: No results for input(s): CKTOTAL, CKMB, CKMBINDEX, TROPONINI in the last 168 hours. BNP (last 3 results) No results for input(s): PROBNP in the last 8760 hours. HbA1C: No results for input(s): HGBA1C in the last 72 hours. CBG: No results for input(s): GLUCAP in the last 168 hours. Lipid Profile: No results for input(s): CHOL, HDL, LDLCALC, TRIG, CHOLHDL, LDLDIRECT in the last 72 hours. Thyroid Function Tests: No results for input(s): TSH,  T4TOTAL, FREET4, T3FREE, THYROIDAB in the last 72 hours. Anemia Panel: No results for input(s): VITAMINB12, FOLATE, FERRITIN, TIBC, IRON, RETICCTPCT in the last 72 hours. Sepsis Labs: Recent Labs  Lab 03/17/18 1023 03/17/18 1244  LATICACIDVEN 2.05* 1.25    Recent Results (from the past 240 hour(s))  Urine culture     Status: Abnormal   Collection Time: 03/17/18  9:53 AM  Result Value Ref Range Status   Specimen Description URINE, RANDOM  Final   Special Requests NONE  Final   Culture (A)  Final    <10,000 COLONIES/mL INSIGNIFICANT GROWTH Performed at [facility], [place] [address] [place], [place] [address]    Report Status 03/18/2018 FINAL  Final  Culture, blood (routine x 2)     Status: None (Preliminary result)   Collection Time: 03/17/18  9:54 AM  Result Value Ref Range Status   Specimen  Description BLOOD RIGHT HAND  Final   Special Requests   Final    BOTTLES DRAWN AEROBIC AND ANAEROBIC Blood Culture results may not be optimal due to an excessive volume of blood received in culture bottles   Culture   Final    NO GROWTH 1 DAY Performed at [facility], [place] [address], [place], [place] [address]    Report Status PENDING  Incomplete  Culture, blood (routine x 2)     Status: None (Preliminary result)   Collection Time: 03/17/18  9:59 AM  Result Value Ref Range Status   Specimen Description BLOOD RIGHT ANTECUBITAL  Final   Special Requests   Final    BOTTLES DRAWN AEROBIC AND ANAEROBIC Blood Culture results may not be optimal due to an excessive volume of blood received in culture bottles   Culture   Final    NO GROWTH 1 DAY Performed at [facility], [place] [address], [place], [place] [address]    Report Status PENDING  Incomplete         Radiology Studies: No results found.      Scheduled Meds: . albuterol  2.5 mg Nebulization TID  . azithromycin  500 mg Oral Daily  . escitalopram  10 mg Oral Daily  . furosemide  40 mg Oral Daily  .  guaiFENesin  1,200 mg Oral BID  . metoprolol succinate  25 mg Oral Daily  . mirtazapine  7.5 mg Oral QHS  . predniSONE  40 mg Oral Q supper   Followed by  . [START ON 03/21/2018] predniSONE  30 mg Oral Q supper   Followed by  . [START ON 03/23/2018] predniSONE  20 mg Oral Q supper   Followed by  . [START ON 03/25/2018] predniSONE  10 mg Oral Q supper  . rivaroxaban  15 mg Oral BID WC   Followed by  . [START ON 03/24/2018] rivaroxaban  20 mg Oral Q supper  . sodium chloride flush  3 mL Intravenous Q12H  . umeclidinium-vilanterol  1 puff Inhalation Daily   Continuous Infusions:   LOS: 0 days    Time spent: over 30 min    Fayrene Helper, MD [facility] Pager [phone]  If 7PM-7AM, please contact night-coverage [email or web address] Password [email or web address] 03/19/2018, 1:20 PM

## 2018-03-19 NOTE — Progress Notes (Signed)
Daughter very concerned about patient being discharged with the patients increased confusion over the past two nights and shortness of breathe at rest not just exerction.  Having a hard time obtaining proper SpO2 when patient is in that situation actually reads low into the 60-70% at home.  Lasix given tonight with chest x-ray and blood gas in the AM.

## 2018-03-19 NOTE — Evaluation (Signed)
Occupational Therapy Evaluation Patient Details Name: [name] MRN: [id number] DOB: [birth year] Today's Date: 03/19/2018    History of Present Illness Pt is a 84 y.o. F with significant PMH of HTN, SSS, afib with pacemaker in place, PMR on chronic steroids, COPD, combined CHF who presents with acute SOB.   Clinical Impression    Pt admitted with above dx and now presenting with SOB and decreased cardiorespiratory support that limits her ability to perform ADLs with prior level of independence and safety. Feel pt would benefit from [facility] at ALF to address functional activity tolerance, safety awareness, and LB b/d. Pt completed functional transfers with (S) and min A for LB tasks.     Follow Up Recommendations  Home health OT(At ALF)    Equipment Recommendations  None recommended by OT    Recommendations for Other Services PT consult;Speech consult     Precautions / Restrictions Precautions Precautions: Fall Restrictions Weight Bearing Restrictions: No      Mobility Bed Mobility Overal bed mobility: Modified Independent             General bed mobility comments: increased time and effort  Transfers Overall transfer level: Needs assistance Equipment used: Rolling walker (2 wheeled) Transfers: Sit to/from Stand Sit to Stand: Supervision         General transfer comment: Increased time     Balance Overall balance assessment: Mild deficits observed, not formally tested                                         ADL either performed or assessed with clinical judgement   ADL Overall ADL's : Needs assistance/impaired Eating/Feeding: Set up   Grooming: Oral care;Standing;Cueing for safety;Supervision/safety   Upper Body Bathing: Set up;Sitting   Lower Body Bathing: Minimal assistance;Sit to/from stand Lower Body Bathing Details (indicate cue type and reason): min A for distal LE bathing Upper Body Dressing : Sitting;Modified independent    Lower Body Dressing: Minimal assistance;Sit to/from stand Lower Body Dressing Details (indicate cue type and reason): for distal LE Toilet Transfer: Supervision/safety;RW;Ambulation   Toileting- Clothing Manipulation and Hygiene: Supervision/safety;Sit to/from stand;Cueing for safety               Vision Patient Visual Report: No change from baseline Vision Assessment?: No apparent visual deficits            Pertinent Vitals/Pain Pain Assessment: No/denies pain     Hand Dominance     Extremity/Trunk Assessment Upper Extremity Assessment Upper Extremity Assessment: Generalized weakness   Lower Extremity Assessment Lower Extremity Assessment: Defer to PT evaluation   Cervical / Trunk Assessment Cervical / Trunk Assessment: Kyphotic   Communication Communication Communication: No difficulties   Cognition Arousal/Alertness: Awake/alert Behavior During Therapy: WFL for tasks assessed/performed Overall Cognitive Status: Impaired/Different from baseline Area of Impairment: Awareness                           Awareness: Emergent   General Comments: Pt AOx3 however daughter reports pt's cognition is not at baseline and pt agrees she has been more confused   General Comments  Multiple skin tears on arms from scratching herself at night. Pt on 2L O2 via [place] throughout session, coughing heavily and with desaturation to 90% during standing      Home Living Family/patient expects to be discharged to:: Assisted living  Home Equipment: [place] - 2 wheels;Cane - single point;Bedside commode   Additional Comments: [place]      Prior Functioning/Environment Level of Independence: Independent with assistive device(s)        Comments: uses RW or cane for ambulation, walks independently to dining hall, has supervision for showers however performs her own ADLs        OT Problem List: Decreased activity  tolerance;Decreased knowledge of use of DME or AE;Decreased cognition;Decreased coordination;Decreased safety awareness;Impaired balance (sitting and/or standing);Decreased strength      OT Treatment/Interventions: Self-care/ADL training;Therapeutic exercise;Energy conservation;DME and/or AE instruction;Therapeutic activities;Cognitive remediation/compensation;Patient/family education;Balance training    OT Goals(Current goals can be found in the care plan section) Acute Rehab OT Goals Patient Stated Goal: "go home." OT Goal Formulation: With patient/family Time For Goal Achievement: 03/26/18 Potential to Achieve Goals: Fair  OT Frequency: Min 2X/week    AM-PAC PT "6 Clicks" Daily Activity     Outcome Measure Help from another person eating meals?: None Help from another person taking care of personal grooming?: A Little Help from another person toileting, which includes using toliet, bedpan, or urinal?: A Little Help from another person bathing (including washing, rinsing, drying)?: A Little Help from another person to put on and taking off regular upper body clothing?: None Help from another person to put on and taking off regular lower body clothing?: A Little 6 Click Score: 20   End of Session Equipment Utilized During Treatment: Rolling walker;Oxygen Nurse Communication: Mobility status  Activity Tolerance: Patient tolerated treatment well Patient left: in CPM;with family/visitor present;with call bell/phone within reach;with chair alarm set  OT Visit Diagnosis: Unsteadiness on feet (R26.81);Muscle weakness (generalized) (M62.81)                Time: 3888-2800 OT Time Calculation (min): 30 min Charges:  OT General Charges $OT Visit: 1 Visit OT Evaluation $OT Eval Low Complexity: 1 Low OT Treatments $Self Care/Home Management : 8-22 mins  Curtis Sites OTR/L 03/19/2018, 10:29 AM

## 2018-03-19 NOTE — Discharge Instructions (Addendum)
Information on my medicine - XARELTO (rivaroxaban)  This medication education was reviewed with me or my healthcare representative as part of my discharge preparation.  The pharmacist that spoke with me during my hospital stay was:  [facility] Margot Chimes, [place]? Xarelto was prescribed to treat blood clots that may have been found in the veins of your legs (deep vein thrombosis) or in your lungs (pulmonary embolism) and to reduce the risk of them occurring again.  What do you need to know about Xarelto? The starting dose is one 15 mg tablet taken TWICE daily with food for the FIRST 21 DAYS then on 03/24/18  the dose is changed to one 20 mg tablet taken ONCE A DAY with your evening meal.  DO NOT stop taking Xarelto without talking to the health care provider who prescribed the medication.  Refill your prescription for 20 mg tablets before you run out.  After discharge, you should have regular check-up appointments with your healthcare provider that is prescribing your Xarelto.  In the future your dose may need to be changed if your kidney function changes by a significant amount.  What do you do if you miss a dose? If you are taking Xarelto TWICE DAILY and you miss a dose, take it as soon as you remember. You may take two 15 mg tablets (total 30 mg) at the same time then resume your regularly scheduled 15 mg twice daily the next day.  If you are taking Xarelto ONCE DAILY and you miss a dose, take it as soon as you remember on the same day then continue your regularly scheduled once daily regimen the next day. Do not take two doses of Xarelto at the same time.   Important Safety Information Xarelto is a blood thinner medicine that can cause bleeding. You should call your healthcare provider right away if you experience any of the following: ? Bleeding from an injury or your nose that does not stop. ? Unusual colored urine (red or dark brown) or  unusual colored stools (red or black). ? Unusual bruising for unknown reasons. ? A serious fall or if you hit your head (even if there is no bleeding).  Some medicines may interact with Xarelto and might increase your risk of bleeding while on Xarelto. To help avoid this, consult your healthcare provider or pharmacist prior to using any new prescription or non-prescription medications, including herbals, vitamins, non-steroidal anti-inflammatory drugs (NSAIDs) and supplements.  This website has more information on Xarelto: [email or web address]    Chronic Obstructive Pulmonary Disease Chronic obstructive pulmonary disease (COPD) is a long-term (chronic) lung problem. When you have COPD, it is hard for air to get in and out of your lungs. The way your lungs work will never return to normal. Usually the condition gets worse over time. There are things you can do to keep yourself as healthy as possible. Your doctor may treat your condition with:  Medicines.  Quitting smoking, if you smoke.  Rehabilitation. This may involve a team of specialists.  Oxygen.  Exercise and changes to your diet.  Lung surgery.  Comfort measures (palliative care).  Follow these instructions at home: Medicines  Take over-the-counter and prescription medicines only as told by your doctor.  Talk to your doctor before taking any cough or allergy medicines. You may need to avoid medicines that cause your lungs to be dry. Lifestyle  If you smoke, stop. Smoking makes the problem worse. If you need help  quitting, ask your doctor.  Avoid being around things that make your breathing worse. This may include smoke, chemicals, and fumes.  Stay active, but remember to also rest.  Learn and use tips on how to relax.  Make sure you get enough sleep. Most adults need at least 7 hours a night.  Eat healthy foods. Eat smaller meals more often. Rest before meals. Controlled breathing  Learn and use tips on how to  control your breathing as told by your doctor. Try: ? Breathing in (inhaling) through your nose for 1 second. Then, pucker your lips and breath out (exhale) through your lips for 2 seconds. ? Putting one hand on your belly (abdomen). Breathe in slowly through your nose for 1 second. Your hand on your belly should move out. Pucker your lips and breathe out slowly through your lips. Your hand on your belly should move in as you breathe out. Controlled coughing  Learn and use controlled coughing to clear mucus from your lungs. The steps are: 1. Lean your head a little forward. 2. Breathe in deeply. 3. Try to hold your breath for 3 seconds. 4. Keep your mouth slightly open while coughing 2 times. 5. Spit any mucus out into a tissue. 6. Rest and do the steps again 1 or 2 times as needed. General instructions  Make sure you get all the shots (vaccines) that your doctor recommends. Ask your doctor about a flu shot and a pneumonia shot.  Use oxygen therapy and therapy to help improve your lungs (pulmonary rehabilitation) if told by your doctor. If you need home oxygen therapy, ask your doctor if you should buy a tool to measure your oxygen level (oximeter).  Make a COPD action plan with your doctor. This helps you know what to do if you feel worse than usual.  Manage any other conditions you have as told by your doctor.  Avoid going outside when it is very hot, cold, or humid.  Avoid people who have a sickness you can catch (contagious).  Keep all follow-up visits as told by your doctor. This is important. Contact a doctor if:  You cough up more mucus than usual.  There is a change in the color or thickness of the mucus.  It is harder to breathe than usual.  Your breathing is faster than usual.  You have trouble sleeping.  You need to use your medicines more often than usual.  You have trouble doing your normal activities such as getting dressed or walking around the house. Get help  right away if:  You have shortness of breath while resting.  You have shortness of breath that stops you from: ? Being able to talk. ? Doing normal activities.  Your chest hurts for longer than 5 minutes.  Your skin color is more blue than usual.  Your pulse oximeter shows that you have low oxygen for longer than 5 minutes.  You have a fever.  You feel too tired to breathe normally. Summary  Chronic obstructive pulmonary disease (COPD) is a long-term lung problem.  The way your lungs work will never return to normal. Usually the condition gets worse over time. There are things you can do to keep yourself as healthy as possible.  Take over-the-counter and prescription medicines only as told by your doctor.  If you smoke, stop. Smoking makes the problem worse. This information is not intended to replace advice given to you by your health care provider. Make sure you discuss any questions you  have with your health care provider. Document Released: 11/23/2007 Document Revised: 11/12/2015 Document Reviewed: 01/31/2013 Elsevier Interactive Patient Education  2017 [employer].

## 2018-03-19 NOTE — Plan of Care (Signed)
Discussed with patient plan of care for the evening, bedtime bath and medications with some teach back displayed.

## 2018-03-19 NOTE — Plan of Care (Signed)
Discussed with patient and daughter plan of care for the evening, pain management, increased confusion and her increased congestion with some teach back displayed.

## 2018-03-19 NOTE — Progress Notes (Signed)
Initial Nutrition Assessment  DOCUMENTATION CODES:   Not applicable  INTERVENTION:    Ensure Enlive po daily, each supplement provides 350 kcal and 20 grams of protein  NUTRITION DIAGNOSIS:   Increased nutrient needs related to chronic illness(COPD) as evidenced by estimated needs  GOAL:   Patient will meet greater than or equal to 90% of their needs  MONITOR:   PO intake, Supplement acceptance, Labs, Skin, Weight trends, I & O's  REASON FOR ASSESSMENT:   Consult COPD Protocol  ASSESSMENT:   84 yo Female with PMH of COPD and CHF; admitted with shortness of breath, acute COPD exacerbation.  RD spoke with patient at bedside. She is resting in bed watching TV. Reports her appetite is "okay".  Resides at [facility] assisted living facility. Typically consumes 2 meals per day; she skips breakfast. "I've never been a breakfast eater".  Patient does drink one Ensure supplement per day. Amenable to receive here. Denies recent unintentional weight loss. UBW is around 140 lbs. Labs & medications reviewed.  Pt has a hx of dysphagia, however, denies any difficulties at this time.  NUTRITION - FOCUSED PHYSICAL EXAM:     Most Recent Value  Orbital Region  Moderate depletion  Upper Arm Region  Moderate depletion  Thoracic and Lumbar Region  Unable to assess  Buccal Region  No depletion  Temple Region  No depletion  Clavicle Bone Region  No depletion  Clavicle and Acromion Bone Region  No depletion  Scapular Bone Region  Unable to assess  Dorsal Hand  No depletion  Patellar Region  No depletion  Anterior Thigh Region  No depletion  Posterior Calf Region  No depletion  Edema (RD Assessment)  None     Diet Order:   Diet Order            Diet Heart Room service appropriate? Yes; Fluid consistency: Thin  Diet effective now             EDUCATION NEEDS:   No education needs have been identified at this time  Skin:  Skin Assessment: Skin Integrity Issues: Skin  Integrity Issues:: Other (Comment) Other: ecchymosis, very thin skin  Last BM:  9/29  Height:   Ht Readings from Last 1 Encounters:  03/17/18 5\' 6"  (1.676 m)   Weight:   Wt Readings from Last 1 Encounters:  03/19/18 64.7 kg   Ideal Body Weight:  59.1 kg  BMI:  Body mass index is 23.02 kg/m.  Estimated Nutritional Needs:   Kcal:  1400-1600  Protein:  75-90 gm  Fluid:  >/= 1.5 L  Arthur Holms, RD, LDN Pager #: [phone] After-Hours Pager #: [phone]

## 2018-03-20 ENCOUNTER — Inpatient Hospital Stay (HOSPITAL_COMMUNITY): Payer: Medicare Other

## 2018-03-20 ENCOUNTER — Encounter (HOSPITAL_COMMUNITY): Payer: Self-pay

## 2018-03-20 LAB — BLOOD GAS, VENOUS
ACID-BASE EXCESS: 11.3 mmol/L — AB (ref 0.0–2.0)
Bicarbonate: 36.3 mmol/L — ABNORMAL HIGH (ref 20.0–28.0)
O2 Saturation: 44.1 %
PATIENT TEMPERATURE: 98.6
PH VEN: 7.415 (ref 7.250–7.430)
pCO2, Ven: 57.8 mmHg (ref 44.0–60.0)

## 2018-03-20 LAB — CBC
HCT: 38.8 % (ref 36.0–46.0)
Hemoglobin: 11.6 g/dL — ABNORMAL LOW (ref 12.0–15.0)
MCH: 28.2 pg (ref 26.0–34.0)
MCHC: 29.9 g/dL — AB (ref 30.0–36.0)
MCV: 94.2 fL (ref 78.0–100.0)
PLATELETS: 335 10*3/uL (ref 150–400)
RBC: 4.12 MIL/uL (ref 3.87–5.11)
RDW: 15.9 % — AB (ref 11.5–15.5)
WBC: 18.5 10*3/uL — ABNORMAL HIGH (ref 4.0–10.5)

## 2018-03-20 LAB — GLUCOSE, CAPILLARY
GLUCOSE-CAPILLARY: 214 mg/dL — AB (ref 70–99)
GLUCOSE-CAPILLARY: 129 mg/dL — AB (ref 70–99)
GLUCOSE-CAPILLARY: 98 mg/dL (ref 70–99)
Glucose-Capillary: 239 mg/dL — ABNORMAL HIGH (ref 70–99)

## 2018-03-20 LAB — BASIC METABOLIC PANEL
Anion gap: 7 (ref 5–15)
BUN: 20 mg/dL (ref 8–23)
CALCIUM: 8.9 mg/dL (ref 8.9–10.3)
CO2: 35 mmol/L — ABNORMAL HIGH (ref 22–32)
CREATININE: 1.03 mg/dL — AB (ref 0.44–1.00)
Chloride: 98 mmol/L (ref 98–111)
GFR calc Af Amer: 56 mL/min — ABNORMAL LOW (ref >60)
GFR calc non Af Amer: 48 mL/min — ABNORMAL LOW (ref >60)
GLUCOSE: 200 mg/dL — AB (ref 70–99)
Potassium: 4.3 mmol/L (ref 3.5–5.1)
SODIUM: 140 mmol/L (ref 135–145)

## 2018-03-20 LAB — MAGNESIUM: Magnesium: 2.2 mg/dL (ref 1.7–2.4)

## 2018-03-20 MED ORDER — INSULIN ASPART 100 UNIT/ML ~~LOC~~ SOLN
0.0000 [IU] | Freq: Three times a day (TID) | SUBCUTANEOUS | Status: DC
  Administered 2018-03-20: 1 [IU] via SUBCUTANEOUS

## 2018-03-20 MED ORDER — INSULIN ASPART 100 UNIT/ML ~~LOC~~ SOLN
0.0000 [IU] | Freq: Every day | SUBCUTANEOUS | Status: DC
  Administered 2018-03-20: 2 [IU] via SUBCUTANEOUS

## 2018-03-20 NOTE — Progress Notes (Signed)
[name] would like to be contacted by the MD in the morning concerned about discharge with declining COPD/CHF status.  Patient having more episodes of shortness of breathe at rest and recurrent PNA.  Daughter does want her discharge without a discussion.

## 2018-03-20 NOTE — Progress Notes (Signed)
.  SATURATION QUALIFICATIONS: (This note is used to comply with regulatory documentation for home oxygen)  Patient Saturations on 2L at Rest = 90%  Patient Saturations on 2L while Ambulating = 81%  Patient Saturations on 6 Liters of oxygen while Ambulating = 90%  Please briefly explain why patient needs home oxygen:Pt on 2L O2 via [place] at home. Pt currently has increased supplemental oxygen demand to 6L via [place] in able to maintain SaO2 at 90%O2 with ambulation.   Vanity Larsson B. Migdalia Dk PT, DPT Acute Rehabilitation Services Pager [phone] Office [phone]

## 2018-03-20 NOTE — Progress Notes (Signed)
Physical Therapy Treatment Patient Details Name: [name] MRN: [id number] DOB: 05-15-1931 Today's Date: 03/20/2018    History of Present Illness Pt is a 84 y.o. F with significant PMH of HTN, SSS, afib with pacemaker in place, PMR on chronic steroids, COPD, combined CHF who presents with acute SOB.    PT Comments    Pt sitting EoB with RN getting ready for walking O2 need. Pt is making progress towards her goals however is limited in safe mobility by oxygen desaturation (see General Comments), 3/4 DoE as well as generalized deconditioning. Pt requires minA for sit>stand from the bed surface and min guard for sit>stand from recliner, able to ambulate 30 feet with min guard and RW. Pt experiences 3/4 DoE with ambulation and requires maximal cuing for pursed lipped breathing, in particular full exhale. Given pt's increased weakness, PT recommends HHPT level rehab at d/c. PT will continue to follow acutely.     Follow Up Recommendations  Supervision for mobility/OOB;Home health PT     Equipment Recommendations  None recommended by PT    Recommendations for Other Services       Precautions / Restrictions Precautions Precautions: Fall Restrictions Weight Bearing Restrictions: No    Mobility  Bed Mobility               General bed mobility comments: sitting EoB on entry  Transfers Overall transfer level: Needs assistance Equipment used: Rolling walker (2 wheeled) Transfers: Sit to/from Stand Sit to Stand: Min assist;Min guard         General transfer comment: minA for power up from bed, min guard for power up x3 from recliner surface  Ambulation/Gait Ambulation/Gait assistance: Min guard Gait Distance (Feet): 30 Feet Assistive device: Rolling walker (2 wheeled) Gait Pattern/deviations: Step-through pattern;Decreased stride length Gait velocity: decreased Gait velocity interpretation: <1.8 ft/sec, indicate of risk for recurrent falls General Gait Details: min  guard for safety, mild instability which pt is able to correct with RW support        Balance Overall balance assessment: Mild deficits observed, not formally tested                                          Cognition Arousal/Alertness: Awake/alert Behavior During Therapy: WFL for tasks assessed/performed Overall Cognitive Status: Within Functional Limits for tasks assessed                                 General Comments: A&O x3         General Comments General comments (skin integrity, edema, etc.): pt on 3L O2 via [place] at rest 94%O2, in recliner with 2L O2 via [place] SaO2 90%O2, with ambulation SaO2 dropped to 81% O2, pt instructed in pursed lipped breathing and required 6L O2 via [place] to be able to ambulate with SaO2 90%. Pt has audible congestion which may be contributing to decreased ability to maintain SaO2      Pertinent Vitals/Pain Pain Assessment: Faces Faces Pain Scale: Hurts a little bit Pain Location: ribs with movement Pain Descriptors / Indicators: Grimacing;Guarding Pain Intervention(s): Monitored during session;Repositioned    Home Living Family/patient expects to be discharged to:: Assisted living             Home Equipment: Walker - 2 wheels;Cane - single point;Bedside commode Additional Comments: [place]  Prior Function Level of Independence: Independent with assistive device(s)      Comments: uses RW or cane for ambulation, walks independently to dining hall, has supervision for showers however performs her own ADLs   PT Goals (current goals can now be found in the care plan section) Acute Rehab PT Goals Patient Stated Goal: "go home." PT Goal Formulation: With patient Time For Goal Achievement: 04/01/18 Potential to Achieve Goals: Good Progress towards PT goals: Progressing toward goals    Frequency    Min 3X/week      PT Plan Discharge plan needs to be updated       AM-PAC PT "6 Clicks" Daily  Activity  Outcome Measure  Difficulty turning over in bed (including adjusting bedclothes, sheets and blankets)?: None Difficulty moving from lying on back to sitting on the side of the bed? : None Difficulty sitting down on and standing up from a chair with arms (e.g., wheelchair, bedside commode, etc,.)?: Unable Help needed moving to and from a bed to chair (including a wheelchair)?: A Little Help needed walking in hospital room?: A Little Help needed climbing 3-5 steps with a railing? : A Lot 6 Click Score: 17    End of Session Equipment Utilized During Treatment: Gait belt;Oxygen Activity Tolerance: Patient tolerated treatment well Patient left: in chair;with call bell/phone within reach;with chair alarm set Nurse Communication: Mobility status PT Visit Diagnosis: Unsteadiness on feet (R26.81);Difficulty in walking, not elsewhere classified (R26.2)     Time: 1420-1450 PT Time Calculation (min) (ACUTE ONLY): 30 min  Charges:  $Gait Training: 23-37 mins                     Fynn Vanblarcom B. Migdalia Dk PT, DPT Acute Rehabilitation Services Pager [phone] Office [phone]    [place] 03/20/2018, 3:21 PM

## 2018-03-20 NOTE — Progress Notes (Signed)
PROGRESS NOTE    [name]  DTO:[id number] DOB: 10/31/1930 DOA: 03/17/2018 PCP: Lajean Manes, MD   Brief Narrative:  84 y.o.femalewith medical history significant ofHTN, SSS, afib with pacemaker in place, PMR on chronic steroids (recently 10 mg daily), COPD, combined CHF (most recent EF of 40-45%) who presented with acute SOB.   Patient was hospitalized a few months ago for pneumonia.  Recently diagnosed with the pneumonia at assisted living facility and was treated with antibiotics.  According to the daughter patient has had a decline since her hospitalization few months ago.  She was also recently diagnosed with a DVT and was started on Xarelto.  She was thought to have COPD exacerbation.  Initially required BiPAP.  Hospitalized for further management.  She's being treated for a COPD exacerbation and is on steroid taper as well as antibiotics.  She's improving slowly, requiring continued hospitalization.  Hopefully will be ready for discharge within the next few days.  Assessment & Plan:   Active Problems:   Pacemaker   CHB (complete heart block) (HCC)   COPD exacerbation (HCC)   Systemic hypertension   Acute COPD exacerbation  COPD on chronic O2 2 L by [place] Possibly exacerbated by viral infection.   No pneumonia noted on chest x-ray (though LLL atelectasis, "difficult to exclude infectious bronchiolitis"   Patient was initially placed on BiPAP, but this was discontinued in the emergency department.   Had repeat CXR with concern for increase WOB and SOB.  Received dose of IV lasix with concern for overload.  On my exam, doesn't appear overloaded, mostly rhonchi on exam, will hold on additional lasix at this time, continue to monitor. At this time, still appears to have room for continued improvement, but no wheezing.  Mostly rhonchi on exam.   She has slightly increased WOB and coarse breath sounds.   Continue steroid taper, azithromycin, prn and scheduled nebs  Acute on  chronic respiratory failure with hypoxia Initially required BiPAP.  Respiratory status has improved, but still with mildly increased WOB and coarse breath sounds.  At baseline O2.  Metabolic Alkalosis  Elevated Creatinine: possibly contraction alkalosis with lasix given overnight.  Follow.   Acute Encephalopathy: improved, per daughter.  This seems to be continuing to improve.  On my discussion with daughter this morning, noted pt was a little confused this morning, but was improving. Likely 2/2 acute illness and hospitalization.  Continue to monitor.  Check VBG.  Recent right lower extremity DVT Noted to be on Xarelto (apparently only received 15 mg starting dose, but daughter called PCP and asked for prescription for 20 mg dose to be sent).  History of complete heart block status post pacemaker Stable.  Essential hypertension Monitor blood pressures closely.  Continue home medications.  History of polymyalgia rheumatica She is on chronic steroids for the same.  Dose has been increased due to COPD exacerbation.  She is on hydrocodone for pain.  Leukocytosis likely due to steroids.  Chronic combined systolic and diastolic CHF Echocardiogram from April 2018 showed EF to be 45%.  She is on diuretics which is been continued.  She was given IV Lasix x1 in the ED and again overnight on 9/30-10/1.  CXR notable for possible mild CHF, but appears euvolemic on exam.  Will hold off on additional lasix at this time.  Repeat CXR on 10/1 with stable cardiomegaly, stable bibasilar subsegmental atelectasis with small pleural effusions.   Not noted to be on ARB or ACE inhibitors.  Will  defer this to outpatient providers  DVT prophylaxis: xarelto Code Status: DNR Family Communication: daughter at bedside and on phone Disposition Plan: hopefully within next 24-48 hours, but pt continues to slowly improve with episodes of increased wob and sob needing continued hospitalization   Consultants:    none  Procedures:   none  Antimicrobials:  Anti-infectives (From admission, onward)   Start     Dose/Rate Route Frequency Ordered Stop   03/18/18 1800  azithromycin (ZITHROMAX) tablet 500 mg     500 mg Oral Daily 03/17/18 1722 03/22/18 0959   03/17/18 1800  azithromycin (ZITHROMAX) 500 mg in sodium chloride 0.9 % 250 mL IVPB     500 mg 250 mL/hr over 60 Minutes Intravenous Every 24 hours 03/17/18 1722 03/17/18 2000         Subjective: A&Ox3 A little disoriented immediately after I woke her up Feeling ok this AM. States SOB feels ok.  Objective: Vitals:   03/19/18 1807 03/19/18 1929 03/19/18 2129 03/20/18 0410  BP: (!) 155/89  (!) 145/85   Pulse: 74  71   Resp:   20   Temp: 98.4 F (36.9 C)  98.8 F (37.1 C)   TempSrc: Oral  Oral   SpO2: 99% 95% 94%   Weight:    63.6 kg  Height:        Intake/Output Summary (Last 24 hours) at 03/20/2018 0702 Last data filed at 03/20/2018 0648 Gross per 24 hour  Intake 253 ml  Output 4100 ml  Net -3847 ml   Filed Weights   03/17/18 1728 03/19/18 0500 03/20/18 0410  Weight: 64.8 kg 64.7 kg 63.6 kg    Examination:  General: No acute distress. Cardiovascular: Heart sounds show a regular rate, and rhythm.  Lungs: coarse breath sounds bilaterally, no increased work of breathing, no appreciated wheezing Neurological: Alert and oriented 3. Moves all extremities 4. Cranial nerves II through XII grossly intact. Skin: Warm and dry. No rashes or lesions. Extremities: No clubbing or cyanosis. No edema.  Psychiatric: Mood and affect are normal. Insight and judgment are appropriate.   Data Reviewed: I have personally reviewed following labs and imaging studies  CBC: Recent Labs  Lab 03/17/18 0957 03/18/18 0410 03/19/18 0338 03/20/18 0310  WBC 16.1* 19.8* 23.7* 18.5*  NEUTROABS 12.2*  --   --   --   HGB 12.1 11.7* 11.5* 11.6*  HCT 41.1 38.5 38.2 38.8  MCV 94.9 91.9 93.2 94.2  PLT 375 292 322 335   Basic Metabolic  Panel: Recent Labs  Lab 03/17/18 0957 03/18/18 0410 03/19/18 0338 03/20/18 0310  NA 139 137 139 140  K 4.0 4.1 4.1 4.3  CL 99 99 97* 98  CO2 31 28 32 35*  GLUCOSE 109* 147* 136* 200*  BUN 14 15 17 20   CREATININE 1.02* 0.79 0.78 1.03*  CALCIUM 8.9 8.6* 8.7* 8.9  MG  --   --   --  2.2   GFR: Estimated Creatinine Clearance: 37.4 mL/min (A) (by C-G formula based on SCr of 1.03 mg/dL (H)). Liver Function Tests: Recent Labs  Lab 03/17/18 0957  AST 20  ALT 9  ALKPHOS 80  BILITOT 1.0  PROT 6.5  ALBUMIN 3.4*   No results for input(s): LIPASE, AMYLASE in the last 168 hours. No results for input(s): AMMONIA in the last 168 hours. Coagulation Profile: Recent Labs  Lab 03/17/18 0957  INR 3.68   Cardiac Enzymes: No results for input(s): CKTOTAL, CKMB, CKMBINDEX, TROPONINI in the last 168  hours. BNP (last 3 results) No results for input(s): PROBNP in the last 8760 hours. HbA1C: No results for input(s): HGBA1C in the last 72 hours. CBG: No results for input(s): GLUCAP in the last 168 hours. Lipid Profile: No results for input(s): CHOL, HDL, LDLCALC, TRIG, CHOLHDL, LDLDIRECT in the last 72 hours. Thyroid Function Tests: No results for input(s): TSH, T4TOTAL, FREET4, T3FREE, THYROIDAB in the last 72 hours. Anemia Panel: No results for input(s): VITAMINB12, FOLATE, FERRITIN, TIBC, IRON, RETICCTPCT in the last 72 hours. Sepsis Labs: Recent Labs  Lab 03/17/18 1023 03/17/18 1244  LATICACIDVEN 2.05* 1.25    Recent Results (from the past 240 hour(s))  Urine culture     Status: Abnormal   Collection Time: 03/17/18  9:53 AM  Result Value Ref Range Status   Specimen Description URINE, RANDOM  Final   Special Requests NONE  Final   Culture (A)  Final    <10,000 COLONIES/mL INSIGNIFICANT GROWTH Performed at [facility], [place] [address] [place], [place] [address]    Report Status 03/18/2018 FINAL  Final  Culture, blood (routine x 2)     Status: None (Preliminary  result)   Collection Time: 03/17/18  9:54 AM  Result Value Ref Range Status   Specimen Description BLOOD RIGHT HAND  Final   Special Requests   Final    BOTTLES DRAWN AEROBIC AND ANAEROBIC Blood Culture results may not be optimal due to an excessive volume of blood received in culture bottles   Culture   Final    NO GROWTH 2 DAYS Performed at [facility], [place] [address] [place], [place] [address]    Report Status PENDING  Incomplete  Culture, blood (routine x 2)     Status: None (Preliminary result)   Collection Time: 03/17/18  9:59 AM  Result Value Ref Range Status   Specimen Description BLOOD RIGHT ANTECUBITAL  Final   Special Requests   Final    BOTTLES DRAWN AEROBIC AND ANAEROBIC Blood Culture results may not be optimal due to an excessive volume of blood received in culture bottles   Culture   Final    NO GROWTH 2 DAYS Performed at [facility], [place] [address], [place], [place] [address]    Report Status PENDING  Incomplete         Radiology Studies: No results found.      Scheduled Meds: . albuterol  2.5 mg Nebulization TID  . azithromycin  500 mg Oral Daily  . escitalopram  10 mg Oral Daily  . feeding supplement (ENSURE ENLIVE)  237 mL Oral Q24H  . furosemide  40 mg Oral Daily  . guaiFENesin  1,200 mg Oral BID  . metoprolol succinate  25 mg Oral Daily  . mirtazapine  7.5 mg Oral QHS  . predniSONE  40 mg Oral Q supper   Followed by  . [START ON 03/21/2018] predniSONE  30 mg Oral Q supper   Followed by  . [START ON 03/23/2018] predniSONE  20 mg Oral Q supper   Followed by  . [START ON 03/25/2018] predniSONE  10 mg Oral Q supper  . rivaroxaban  15 mg Oral BID WC   Followed by  . [START ON 03/24/2018] rivaroxaban  20 mg Oral Q supper  . sodium chloride flush  3 mL Intravenous Q12H  . umeclidinium-vilanterol  1 puff Inhalation Daily   Continuous Infusions:   LOS: 1 day    Time spent: over 30 min  Fayrene Helper, MD [facility] Pager [phone]  If 7PM-7AM, please contact night-coverage [email or web address] Password [email or web address] 03/20/2018, 7:02 AM

## 2018-03-21 ENCOUNTER — Telehealth: Payer: Self-pay

## 2018-03-21 DIAGNOSIS — J441 Chronic obstructive pulmonary disease with (acute) exacerbation: Principal | ICD-10-CM

## 2018-03-21 DIAGNOSIS — J9621 Acute and chronic respiratory failure with hypoxia: Secondary | ICD-10-CM

## 2018-03-21 DIAGNOSIS — R531 Weakness: Secondary | ICD-10-CM

## 2018-03-21 DIAGNOSIS — I5043 Acute on chronic combined systolic (congestive) and diastolic (congestive) heart failure: Secondary | ICD-10-CM

## 2018-03-21 DIAGNOSIS — I10 Essential (primary) hypertension: Secondary | ICD-10-CM

## 2018-03-21 LAB — CBC
HCT: 38.6 % (ref 36.0–46.0)
Hemoglobin: 11.4 g/dL — ABNORMAL LOW (ref 12.0–15.0)
MCH: 27.7 pg (ref 26.0–34.0)
MCHC: 29.5 g/dL — ABNORMAL LOW (ref 30.0–36.0)
MCV: 93.7 fL (ref 78.0–100.0)
PLATELETS: 329 10*3/uL (ref 150–400)
RBC: 4.12 MIL/uL (ref 3.87–5.11)
RDW: 16.1 % — AB (ref 11.5–15.5)
WBC: 17.6 10*3/uL — AB (ref 4.0–10.5)

## 2018-03-21 LAB — BASIC METABOLIC PANEL
Anion gap: 6 (ref 5–15)
BUN: 17 mg/dL (ref 8–23)
CO2: 35 mmol/L — ABNORMAL HIGH (ref 22–32)
CREATININE: 0.91 mg/dL (ref 0.44–1.00)
Calcium: 8.9 mg/dL (ref 8.9–10.3)
Chloride: 98 mmol/L (ref 98–111)
GFR calc non Af Amer: 56 mL/min — ABNORMAL LOW (ref >60)
Glucose, Bld: 153 mg/dL — ABNORMAL HIGH (ref 70–99)
Potassium: 4.3 mmol/L (ref 3.5–5.1)
SODIUM: 139 mmol/L (ref 135–145)

## 2018-03-21 LAB — MAGNESIUM: MAGNESIUM: 2.3 mg/dL (ref 1.7–2.4)

## 2018-03-21 LAB — GLUCOSE, CAPILLARY
Glucose-Capillary: 116 mg/dL — ABNORMAL HIGH (ref 70–99)
Glucose-Capillary: 104 mg/dL — ABNORMAL HIGH (ref 70–99)

## 2018-03-21 MED ORDER — PREDNISONE 20 MG PO TABS: 40.0000 mg | ORAL_TABLET | Freq: Every day | ORAL | 0 refills | Status: DC

## 2018-03-21 MED ORDER — IPRATROPIUM-ALBUTEROL 0.5-2.5 (3) MG/3ML IN SOLN: 3.0000 mL | Freq: Four times a day (QID) | RESPIRATORY_TRACT | 0 refills | Status: AC | PRN

## 2018-03-21 MED ORDER — PREDNISONE 10 MG PO TABS: 10.0000 mg | ORAL_TABLET | Freq: Every day | ORAL | 0 refills | Status: DC

## 2018-03-21 MED ORDER — ENSURE ENLIVE PO LIQD: 237.0000 mL | ORAL | 12 refills | Status: DC

## 2018-03-21 NOTE — NC FL2 (Signed)
[place] LEVEL OF CARE SCREENING TOOL     IDENTIFICATION  Patient Name: [name] Birthdate: Jun 10, 1931 Sex: female Admission Date (Current Location): 03/17/2018  [facility] and [place] Number:  Herbalist and Address:  [facility] [place], [place] [address] [place], [place] [address]      Provider Number: [id number]  Attending Physician Name and Address:  Louellen Molder, MD  Relative Name and Phone Number:  [name], daughter [phone]    Current Level of Care: Hospital Recommended Level of Care: [place] Prior Approval Number:    Date Approved/Denied:   PASRR Number:    Discharge Plan: Other (Comment)(ALF)    Current Diagnoses: Patient Active Problem List   Diagnosis Date Noted  . Weakness generalized 03/21/2018  . Right middle lobe pneumonia ([place]) 11/20/2017  . Elective replacement indicated for cardiac pacemaker battery at end of lifespan 08/09/2017  . Chronic pulmonary aspiration 05/02/2017  . Encounter for palliative care   . Goals of care, counseling/discussion   . Shortness of breath   . Stress fracture of thoracic vertebra 03/19/2017  . Dementia ([place]) 03/19/2017  . Non-traumatic compression fracture of T9 thoracic vertebra 01/02/2017  . Polymyalgia rheumatica ([place])   . Postural dizziness with near syncope   . Syncope 10/11/2016  . Postural dizziness with presyncope 10/11/2016  . Acute hypoxemic respiratory failure ([place]) 10/05/2016  . Chronic diastolic CHF (congestive heart failure) ([place]) 10/05/2016  . Smoker 10/05/2016  . Closed multiple fractures of right upper extremity with ribs with routine healing 10/05/2016  . HCAP (healthcare-associated pneumonia) 10/31/2015  . COPD (chronic obstructive pulmonary disease) ([place]) 10/31/2015  . Leukocytosis 10/31/2015  . Benign essential HTN 10/31/2015  . Anxiety state   . COPD exacerbation ([place]) 08/26/2015  . Systemic hypertension 08/26/2015  .  Hypokalemia 08/26/2015  . Anticoagulant long-term use 08/26/2015  . DM (dermatomyositis) 08/26/2015  . Acute on chronic diastolic CHF (congestive heart failure), NYHA class 1 ([place])   . Sepsis ([place]) 06/07/2015  . CAP (community acquired pneumonia) 06/07/2015  . Acute CHF (congestive heart failure) ([place]) 06/07/2015  . Paroxysmal atrial fibrillation ([place]) 09/04/2014  . Pacemaker 07/13/2013  . CHB (complete heart block) ([place]) 07/13/2013  . SSS (sick sinus syndrome) ([place]) 07/13/2013  . Orthostatic hypotension 07/13/2013  . Aortic insufficiency 07/13/2013    Orientation RESPIRATION BLADDER Height & Weight     Self, Time, Situation, Place  O2(Nasal Cannula 2L) Continent Weight: 64 kg Height:  5\' 6"  (167.6 cm)  BEHAVIORAL SYMPTOMS/MOOD NEUROLOGICAL BOWEL NUTRITION STATUS      Continent Diet(Regular)  AMBULATORY STATUS COMMUNICATION OF NEEDS Skin   Supervision Verbally Normal                       Personal Care Assistance Level of Assistance  Bathing, Feeding, Dressing Bathing Assistance: Limited assistance Feeding assistance: Independent Dressing Assistance: Limited assistance     Functional Limitations Info  Sight, Hearing, Speech Sight Info: Impaired Hearing Info: Adequate Speech Info: Adequate    SPECIAL CARE FACTORS FREQUENCY  PT (By licensed PT), OT (By licensed OT)     PT Frequency: 5x/week home health OT Frequency: 3x/week            Contractures Contractures Info: Not present    Additional Factors Info  Code Status, Allergies, Psychotropic, Insulin Sliding Scale Code Status Info: DNR Allergies Info: NKA Psychotropic Info: Lexapro Insulin Sliding Scale Info: insulin       Current Medications (  03/21/2018):    Discharge Medications:  STOP taking these medications   albuterol (2.5 MG/3ML) 0.083% nebulizer solution Commonly known as:  PROVENTIL   ALPRAZolam 0.25 MG tablet Commonly known as:  XANAX   docusate sodium 100 MG capsule Commonly  known as:  COLACE   guaiFENesin 600 MG 12 hr tablet Commonly known as:  MUCINEX     TAKE these medications   acetaminophen 325 MG tablet Commonly known as:  TYLENOL Take 325 mg by mouth every 6 (six) hours as needed for mild pain, moderate pain or fever.   alendronate 70 MG tablet Commonly known as:  FOSAMAX Take 70 mg by mouth every Wednesday. Take with a full glass of water on an empty stomach.   escitalopram 10 MG tablet Commonly known as:  LEXAPRO Take 10 mg by mouth daily.   feeding supplement (ENSURE ENLIVE) Liqd Take 237 mLs by mouth daily.   furosemide 40 MG tablet Commonly known as:  LASIX Take 40 mg by mouth daily. What changed:  Another medication with the same name was changed. Make sure you understand how and when to take each.   furosemide 40 MG tablet Commonly known as:  LASIX Take 0.5 tablets (20 mg total) daily by mouth. What changed:    when to take this  reasons to take this   guaiFENesin-dextromethorphan 100-10 MG/5ML syrup Commonly known as:  ROBITUSSIN DM Take 10 mLs by mouth every 4 (four) hours as needed for cough.   HYDROcodone-acetaminophen 5-325 MG tablet Commonly known as:  NORCO/VICODIN Take 1-2 tablets every 4 (four) hours as needed by mouth for moderate pain. What changed:  how much to take   ipratropium-albuterol 0.5-2.5 (3) MG/3ML Soln Commonly known as:  DUONEB Take 3 mLs by nebulization every 6 (six) hours as needed.   metoprolol succinate 25 MG 24 hr tablet Commonly known as:  TOPROL-XL Take 25 mg by mouth daily.   midodrine 2.5 MG tablet Commonly known as:  PROAMATINE Take 2.5 mg by mouth as needed (when systolic BP is lower than 100).   mirtazapine 15 MG tablet Commonly known as:  REMERON Take 7.5 mg by mouth at bedtime.   ondansetron 4 MG tablet Commonly known as:  ZOFRAN Take 4 mg by mouth every 6 (six) hours as needed for nausea or vomiting.   OXYGEN Inhale 2 L into the lungs continuous.    potassium chloride SA 20 MEQ tablet Commonly known as:  K-DUR,KLOR-CON Take 1 tablet (20 mEq total) daily by mouth. What changed:    when to take this  reasons to take this   predniSONE 20 MG tablet Commonly known as:  DELTASONE Take 2 tablets (40 mg total) by mouth daily with breakfast for 5 days. What changed:    medication strength  how much to take  how to take this  when to take this  additional instructions   predniSONE 10 MG tablet Commonly known as:  DELTASONE Take 1 tablet (10 mg total) by mouth daily. Start taking on:  03/27/2018 What changed:  You were already taking a medication with the same name, and this prescription was added. Make sure you understand how and when to take each.   Rivaroxaban 15 MG Tabs tablet Commonly known as:  XARELTO Take 15 mg by mouth 2 (two) times daily with a meal. Take for 21 days   umeclidinium-vilanterol 62.5-25 MCG/INH Aepb Commonly known as:  ANORO ELLIPTA Inhale 1 puff into the lungs daily.      Relevant  Imaging Results:  Relevant Lab Results:   Additional Information SSN [id number]. Needs Home Health PT/OT/RN/ST  Benard Halsted, LCSW

## 2018-03-21 NOTE — Clinical Social Work Note (Signed)
Clinical Social Work Assessment  Patient Details  Name: [name] MRN: [id number] Date of Birth: March 18, 1931  Date of referral:  03/21/18               Reason for consult:  Discharge Planning                Permission sought to share information with:  Facility Sport and exercise psychologist, Family Supports Permission granted to share information::  Yes, Verbal Permission Granted  Name::     Medical sales representative::  [facility]  Relationship::  Daughter  Contact Information:  [phone]  Housing/Transportation Living arrangements for the past 2 months:  [place] of Information:  Patient Patient Interpreter Needed:  None Criminal Activity/Legal Involvement Pertinent to Current Situation/Hospitalization:  No - Comment as needed Significant Relationships:  Adult Children Lives with:  Facility Resident Do you feel safe going back to the place where you live?  Yes Need for family participation in patient care:  No (Coment)  Care giving concerns:  CSW received consult regarding discharge planning. Patient reports that she resides at [facility] and will return at discharge. CSW to continue to follow and assist with discharge planning needs.   Social Worker assessment / plan:  CSW spoke with patient regarding return to ALF.   Employment status:  Retired Forensic scientist:  Medicare PT Recommendations:  Home with [place] / Referral to community resources:     Patient/Family's Response to care:  Patient reports agreement with discharge plan and will require PTAR due to oxygen needs.  Patient/Family's Understanding of and Emotional Response to Diagnosis, Current Treatment, and Prognosis:  Patient/family is realistic regarding therapy needs and expressed being hopeful for return to ALF today. Patient expressed understanding of CSW role and discharge process as well as medical condition. No questions/concerns about plan or treatment.    Emotional  Assessment Appearance:  Appears stated age Attitude/Demeanor/Rapport:  Engaged Affect (typically observed):  Accepting, Appropriate Orientation:  Oriented to Self, Oriented to Place, Oriented to  Time, Oriented to Situation Alcohol / Substance use:  Not Applicable Psych involvement (Current and /or in the community):  No (Comment)  Discharge Needs  Concerns to be addressed:  Care Coordination Readmission within the last 30 days:  No Current discharge risk:  None Barriers to Discharge:  No Barriers Identified   Benard Halsted, LCSW 03/21/2018, 12:40 PM

## 2018-03-21 NOTE — Telephone Encounter (Signed)
Phone call received from [facility] case manager to confirm that Palliative Care is still following this patient. Palliative care will continue to follow when discharged to facility.

## 2018-03-21 NOTE — Consult Note (Signed)
   [facility] CM Inpatient Consult   03/21/2018  [name] 1930-12-12 [id number]    Call received from inpatient [facility] for [facility] referral. Discussed that patient appears to be active with Community Palliative Care at [facility]. Inpatient RNCM also endorses patient will have home health and is returning to ALF today. Discussed that patient appears to have the services she needs thru palliative care and home health. No identifiable [facility] Care Management needs noted at this time.  Confirmed with Community Palliative Care at [facility] that [name] remains active and they will follow up with patient at ALF.  Made inpatient RNCM aware of above notes.   Marthenia Rolling, MSN-Ed, RN,BSN [facility] Liaison [phone]

## 2018-03-21 NOTE — Discharge Summary (Addendum)
Physician Discharge Summary  [name] YYT:[id number] DOB: 1930/08/06 DOA: 03/17/2018  PCP: Lajean Manes, MD  Admit date: 03/17/2018 Discharge date: 03/21/2018  Admitted From: Assisted living Disposition: Assisted living  Recommendations for Outpatient Follow-up:  Follow up with PCP in 1 week ( has appointment) Follow-up with pulmonologist in 1 to 2 months.  Patient will be discharged on 5 days of oral prednisone 40 mg daily and then resume chronic home dose of 10 mg daily.  Home Health: PT, OT and RN Equipment/Devices: Chronic home O2 2 L via nasal cannula  Discharge Condition: Fair CODE STATUS: DNR Diet recommendation: Heart Healthy    Discharge Diagnoses:  Principal Problem:   COPD with acute exacerbation exacerbation ([place])  Active Problems: Acute on chronic respiratory failure with hypoxia. Complete heart block status post pacemaker placement   Systemic hypertension   Acute on chronic combined systolic and diastolic CHF (congestive heart failure), NYHA class 1 (HCC)   Weakness generalized  Brief narrative/HPI Please refer to admission H&P for details, in brief,84 y.o.femalewith medical history significant ofHTN, SSS, afib with pacemaker in place, PMR on chronic steroids (recently 10 mg daily), COPD, combined CHF (most recent EF of 40-45%) who presentedwith acute SOB. Patient was hospitalized a few months ago for pneumonia. Recently diagnosed with the pneumonia at assisted living facility and was treated with antibiotics. According to the daughter patient has had a decline since her hospitalization few months ago. She was also recently diagnosed with a DVT and was started on Xarelto. She was thought to have COPD exacerbation. Initially required BiPAP. Hospitalized for further management.  Hospital course Acute exacerbation of COPD/acute on chronic respiratory failure with hypoxia Likely exacerbated by viral infection/recent pneumonia.  Chest x-ray negative  for any infiltrate.  Patient initially required BiPAP but was transitioned to nasal cannula by the time he got admitted to the floor.  Received prednisone with taper, scheduled and PRN nebs, azithromycin and antitussives. She reports her breathing to be almost 90% of her baseline. I will discharge her on oral prednisone 40 mg daily for 5 days and she can resume her chronic home dose prednisone of 10 mg daily.  Completed 5 days course of antibiotics. Patient is on Anoro Ellipta inhaler at home which should be continued.  Her pulmonologist had discontinued her albute Atrovent inhaler to simplify her medication regimen and placed her on as needed albuterol nebs. I will switch her to duo nebs as needed for increased shortness of breath or wheezing.  Instructed to continue using oxygen.  Acute metabolic encephalopathy Reported to be confused during hospital stay which has now resolved and at baseline mental status (patient is fully alert and oriented).  This likely contributed by her acute illness.  Recent right lower extremity DVT Patient is on Xarelto  Acute on chronic combined systolic and diastolic CHF.   Last echo with EF of 45%.  Patient was given intermittent IV Lasix while in the hospital for mild symptoms.  Currently euvolemic.  Continue home dose Lasix. Continue metoprolol.  Patient is not on ACE inhibitor or ARB and should be addressed as outpatient.  Generalized weakness Has PT, OT and RN at the assisted living which will be resumed.  Polymyalgia rheumatica On chronic low-dose prednisone.  Essential hypertension Blood pressure elevated.  Continue metoprolol.  Follow-up as outpatient.  History of complete heart block post pacemaker  Stable.  Elevated blood glucose Secondary to steroid.  Follow-up as outpatient.  Procedure: None Consults: None Family communication: Daughter at bedside  Discharge Instructions   Allergies as of 03/21/2018   No Known Allergies      Medication List    STOP taking these medications   albuterol (2.5 MG/3ML) 0.083% nebulizer solution Commonly known as:  PROVENTIL   ALPRAZolam 0.25 MG tablet Commonly known as:  XANAX   docusate sodium 100 MG capsule Commonly known as:  COLACE   guaiFENesin 600 MG 12 hr tablet Commonly known as:  MUCINEX     TAKE these medications   acetaminophen 325 MG tablet Commonly known as:  TYLENOL Take 325 mg by mouth every 6 (six) hours as needed for mild pain, moderate pain or fever.   alendronate 70 MG tablet Commonly known as:  FOSAMAX Take 70 mg by mouth every Wednesday. Take with a full glass of water on an empty stomach.   escitalopram 10 MG tablet Commonly known as:  LEXAPRO Take 10 mg by mouth daily.   feeding supplement (ENSURE ENLIVE) Liqd Take 237 mLs by mouth daily.   furosemide 40 MG tablet Commonly known as:  LASIX Take 40 mg by mouth daily. What changed:  Another medication with the same name was changed. Make sure you understand how and when to take each.   furosemide 40 MG tablet Commonly known as:  LASIX Take 0.5 tablets (20 mg total) daily by mouth. What changed:    when to take this  reasons to take this   guaiFENesin-dextromethorphan 100-10 MG/5ML syrup Commonly known as:  ROBITUSSIN DM Take 10 mLs by mouth every 4 (four) hours as needed for cough.   HYDROcodone-acetaminophen 5-325 MG tablet Commonly known as:  NORCO/VICODIN Take 1-2 tablets every 4 (four) hours as needed by mouth for moderate pain. What changed:  how much to take   ipratropium-albuterol 0.5-2.5 (3) MG/3ML Soln Commonly known as:  DUONEB Take 3 mLs by nebulization every 6 (six) hours as needed.   metoprolol succinate 25 MG 24 hr tablet Commonly known as:  TOPROL-XL Take 25 mg by mouth daily.   midodrine 2.5 MG tablet Commonly known as:  PROAMATINE Take 2.5 mg by mouth as needed (when systolic BP is lower than 100).   mirtazapine 15 MG tablet Commonly known as:   REMERON Take 7.5 mg by mouth at bedtime.   ondansetron 4 MG tablet Commonly known as:  ZOFRAN Take 4 mg by mouth every 6 (six) hours as needed for nausea or vomiting.   OXYGEN Inhale 2 L into the lungs continuous.   potassium chloride SA 20 MEQ tablet Commonly known as:  K-DUR,KLOR-CON Take 1 tablet (20 mEq total) daily by mouth. What changed:    when to take this  reasons to take this   predniSONE 20 MG tablet Commonly known as:  DELTASONE Take 2 tablets (40 mg total) by mouth daily with breakfast for 5 days. What changed:    medication strength  how much to take  how to take this  when to take this  additional instructions   predniSONE 10 MG tablet Commonly known as:  DELTASONE Take 1 tablet (10 mg total) by mouth daily. Start taking on:  03/27/2018 What changed:  You were already taking a medication with the same name, and this prescription was added. Make sure you understand how and when to take each.   Rivaroxaban 15 MG Tabs tablet Commonly known as:  XARELTO Take 15 mg by mouth 2 (two) times daily with a meal. Take for 21 days   umeclidinium-vilanterol 62.5-25 MCG/INH Aepb Commonly known as:  ANORO ELLIPTA Inhale 1 puff into the lungs daily.      Follow-up Information    Lajean Manes, MD Follow up on 03/27/2018.   Specialty:  Internal Medicine Why:  has appt Contact information: [address] [place] [address] [phone]        Marshell Garfinkel, MD. Schedule an appointment as soon as possible for a visit in 1 month(s).   Specialty:  Pulmonary Disease Contact information: [address] [place] [address] [phone]          No Known Allergies    Procedures/Studies: Dg Chest Port 1 View  Result Date: 03/20/2018 CLINICAL DATA:  Follow-up. EXAM: PORTABLE CHEST 1 VIEW COMPARISON:  Radiograph of March 17, 2018. FINDINGS: Stable cardiomegaly. No pneumothorax is noted. Right-sided PICC line is  unchanged in position. Atherosclerosis of thoracic aorta is noted. Stable bibasilar subsegmental atelectasis is noted with small pleural effusions. Bony thorax is unremarkable. IMPRESSION: Stable cardiomegaly. Stable bibasilar subsegmental atelectasis with small pleural effusions. Aortic Atherosclerosis (ICD10-I70.0). Electronically Signed   By: Marijo Conception, M.D.   On: 03/20/2018 07:53   Dg Chest Port 1 View  Result Date: 03/17/2018 CLINICAL DATA:  Shortness of breath this morning. Wheezing and rales. On BiPAP. EXAM: PORTABLE CHEST 1 VIEW COMPARISON:  01/22/2018 and CT chest 11/20/2017. FINDINGS: Trachea is midline. Heart is enlarged. Thoracic aorta is calcified. Pacemaker lead tips project over the right atrium and right ventricle. Small left pleural effusion with interstitial accentuation in the left lower lobe. There may be peripheral septal lines the right lung base. IMPRESSION: 1. Question mild congestive heart failure. 2. Left lower lobe subsegmental atelectasis. Difficult to exclude an infectious bronchiolitis. Electronically Signed   By: Lorin Picket M.D.   On: 03/17/2018 10:47       Subjective: Reports having mild productive cough but otherwise breathing much better and feels about 90% of her baseline.  Discharge Exam: Vitals:   03/21/18 0920 03/21/18 1006  BP:    Pulse:    Resp:    Temp:    SpO2: 97% 94%   Vitals:   03/21/18 0408 03/21/18 0729 03/21/18 0920 03/21/18 1006  BP:  (!) 167/86    Pulse:  69    Resp:  16    Temp:  97.9 F (36.6 C)    TempSrc:  Oral    SpO2:  100% 97% 94%  Weight: 64 kg     Height:        General: Elderly female not in distress HEENT: Moist mucosa, supple neck Chest: Few scattered rhonchi, no crackles or wheeze CVS: Normal S1 and S2, no murmurs rub or gallop GI: Soft, nondistended, nontender, bowel sounds present Musculoskeletal: Warm, no edema CNs: Alert oriented, nonfocal   The results of significant diagnostics from this  hospitalization (including imaging, microbiology, ancillary and laboratory) are listed below for reference.     Microbiology: Recent Results (from the past 240 hour(s))  Urine culture     Status: Abnormal   Collection Time: 03/17/18  9:53 AM  Result Value Ref Range Status   Specimen Description URINE, RANDOM  Final   Special Requests NONE  Final   Culture (A)  Final    <10,000 COLONIES/mL INSIGNIFICANT GROWTH Performed at [facility], [address] [place], [place] [address]    Report Status 03/18/2018 FINAL  Final  Culture, blood (routine x 2)     Status: None (Preliminary result)   Collection Time: 03/17/18  9:54 AM  Result Value Ref Range Status   Specimen Description BLOOD RIGHT HAND  Final   Special Requests   Final    BOTTLES DRAWN AEROBIC AND ANAEROBIC Blood Culture results may not be optimal due to an excessive volume of blood received in culture bottles   Culture   Final    NO GROWTH 4 DAYS Performed at [facility] [address], [place], [place] [address]    Report Status PENDING  Incomplete  Culture, blood (routine x 2)     Status: None (Preliminary result)   Collection Time: 03/17/18  9:59 AM  Result Value Ref Range Status   Specimen Description BLOOD RIGHT ANTECUBITAL  Final   Special Requests   Final    BOTTLES DRAWN AEROBIC AND ANAEROBIC Blood Culture results may not be optimal due to an excessive volume of blood received in culture bottles   Culture   Final    NO GROWTH 4 DAYS Performed at [facility], [place] [address], [place], [place] [address]    Report Status PENDING  Incomplete     Labs: BNP (last 3 results) Recent Labs    04/21/17 2330 11/20/17 0944 03/17/18 0957  BNP 917.2* 706.9* 622.2*   Basic Metabolic Panel: Recent Labs  Lab 03/17/18 0957 03/18/18 0410 03/19/18 0338 03/20/18 0310 03/21/18 0304  NA 139 137 139 140 139  K 4.0 4.1 4.1 4.3 4.3  CL 99 99 97* 98 98  CO2 31 28 32 35* 35*  GLUCOSE 109* 147* 136*  200* 153*  BUN 14 15 17 20 17   CREATININE 1.02* 0.79 0.78 1.03* 0.91  CALCIUM 8.9 8.6* 8.7* 8.9 8.9  MG  --   --   --  2.2 2.3   Liver Function Tests: Recent Labs  Lab 03/17/18 0957  AST 20  ALT 9  ALKPHOS 80  BILITOT 1.0  PROT 6.5  ALBUMIN 3.4*   No results for input(s): LIPASE, AMYLASE in the last 168 hours. No results for input(s): AMMONIA in the last 168 hours. CBC: Recent Labs  Lab 03/17/18 0957 03/18/18 0410 03/19/18 0338 03/20/18 0310 03/21/18 0304  WBC 16.1* 19.8* 23.7* 18.5* 17.6*  NEUTROABS 12.2*  --   --   --   --   HGB 12.1 11.7* 11.5* 11.6* 11.4*  HCT 41.1 38.5 38.2 38.8 38.6  MCV 94.9 91.9 93.2 94.2 93.7  PLT 375 292 322 335 329   Cardiac Enzymes: No results for input(s): CKTOTAL, CKMB, CKMBINDEX, TROPONINI in the last 168 hours. BNP: Invalid input(s): POCBNP CBG: Recent Labs  Lab 03/20/18 0941 03/20/18 1110 03/20/18 1708 03/20/18 2113 03/21/18 0727  GLUCAP 214* 129* 98 239* 116*   D-Dimer No results for input(s): DDIMER in the last 72 hours. Hgb A1c No results for input(s): HGBA1C in the last 72 hours. Lipid Profile No results for input(s): CHOL, HDL, LDLCALC, TRIG, CHOLHDL, LDLDIRECT in the last 72 hours. Thyroid function studies No results for input(s): TSH, T4TOTAL, T3FREE, THYROIDAB in the last 72 hours.  Invalid input(s): FREET3 Anemia work up No results for input(s): VITAMINB12, FOLATE, FERRITIN, TIBC, IRON, RETICCTPCT in the last 72 hours. Urinalysis    Component Value Date/Time   COLORURINE YELLOW 03/17/2018 [place] 03/17/2018 1252   LABSPEC 1.011 03/17/2018 1252   PHURINE 5.0 03/17/2018 1252   GLUCOSEU NEGATIVE 03/17/2018 1252   HGBUR SMALL (A) 03/17/2018 1252   BILIRUBINUR NEGATIVE 03/17/2018 1252   KETONESUR 5 (A) 03/17/2018 1252  PROTEINUR NEGATIVE 03/17/2018 1252   UROBILINOGEN 1.0 01/19/2011 1030   NITRITE NEGATIVE 03/17/2018 1252   LEUKOCYTESUR NEGATIVE 03/17/2018 1252   Sepsis Labs Invalid  input(s): PROCALCITONIN,  WBC,  LACTICIDVEN Microbiology Recent Results (from the past 240 hour(s))  Urine culture     Status: Abnormal   Collection Time: 03/17/18  9:53 AM  Result Value Ref Range Status   Specimen Description URINE, RANDOM  Final   Special Requests NONE  Final   Culture (A)  Final    <10,000 COLONIES/mL INSIGNIFICANT GROWTH Performed at [facility], [place] [address] [place], [place] [address]    Report Status 03/18/2018 FINAL  Final  Culture, blood (routine x 2)     Status: None (Preliminary result)   Collection Time: 03/17/18  9:54 AM  Result Value Ref Range Status   Specimen Description BLOOD RIGHT HAND  Final   Special Requests   Final    BOTTLES DRAWN AEROBIC AND ANAEROBIC Blood Culture results may not be optimal due to an excessive volume of blood received in culture bottles   Culture   Final    NO GROWTH 4 DAYS Performed at [facility], [place] [address], [place], [place] [address]    Report Status PENDING  Incomplete  Culture, blood (routine x 2)     Status: None (Preliminary result)   Collection Time: 03/17/18  9:59 AM  Result Value Ref Range Status   Specimen Description BLOOD RIGHT ANTECUBITAL  Final   Special Requests   Final    BOTTLES DRAWN AEROBIC AND ANAEROBIC Blood Culture results may not be optimal due to an excessive volume of blood received in culture bottles   Culture   Final    NO GROWTH 4 DAYS Performed at [facility], [place] [address] [place], [place] [address]    Report Status PENDING  Incomplete     Time coordinating discharge: 35 minutes  SIGNED:   Louellen Molder, MD  [facility] 03/21/2018, 10:41 AM Pager   If 7PM-7AM, please contact night-coverage [email or web address] Password [email or web address]

## 2018-03-21 NOTE — Progress Notes (Signed)
Patient will DC to: [facility] Anticipated DC date: 03/21/18 Family notified: Daughter at bedside Transport by: Daughter by car when portable oxygen tank is delivered to room   Per MD patient ready for DC to [facility]. RN, patient, patient's family, and facility notified of DC. Discharge Summary and FL2 sent to facility and approved. RN given number for report [phone] RN is Amy). DNR on chart.  CSW signing off.  Cedric Fishman, LCSW Clinical Social Worker [phone]

## 2018-03-21 NOTE — Care Management Note (Addendum)
Case Management Note  Patient Details  Name: [name] MRN: [id number] Date of Birth: 1930-11-04  Subjective/Objective:  From [facility], she is active with [facility] for [place], [place], [place], she is for dc today, per her daughter would like her to have COPD/CHF wellness program,  NCM contacted [place] with [facility], she will add these services to what patient is already getting at the ALF.  Will need Orders from MD with face to face. MD notified.  Patient is also active with Palliative care at the ALF  With [facility]. Per CSW daughter wants to take patient by car, she has oxygen with [facility], [place] with [facility], will bring a tank to her room prior to dc to take by car back to facility.                 Action/Plan: DC back to ALF when ready.   Expected Discharge Date:  03/18/18               Expected Discharge Plan:  [place]  In-House Referral:  Clinical Social Work  Discharge planning Services  CM Consult  Post Acute Care Choice:  Home Health Choice offered to:  Adult Children  DME Arranged:    DME Agency:     HH Arranged:  RN, PT, OT, Disease Management [place] Agency:  [facility]  Status of Service:  Completed, signed off  If discussed at [employer], dates discussed:    Additional Comments:  [name], RN 03/21/2018, 9:09 AM

## 2018-03-21 NOTE — Progress Notes (Signed)
Occupational Therapy Treatment Patient Details Name: [name] MRN: [id number] DOB: [birth year] Today's Date: 03/21/2018    History of present illness Pt is a 84 y.o. F with significant PMH of HTN, SSS, afib with pacemaker in place, PMR on chronic steroids, COPD, combined CHF who presents with acute SOB.   OT comments  Pt with noted increase in pain, decline in independence/performance this session with mod A required for transfers, and bed mobility. Pt declined use of RW this session - using that might have been able to make a difference in performance/required level of assist. Pt was able to perform peri care with mod A for balance/steady assist and assist to pull underwear back up. RN notified at the end of session of SOB, increased need for assist.   Follow Up Recommendations       Equipment Recommendations  None recommended by OT(Pt has access to appropriate DME at ALF)    Recommendations for Other Services PT consult;Speech consult    Precautions / Restrictions Precautions Precautions: Fall Restrictions Weight Bearing Restrictions: No       Mobility Bed Mobility Overal bed mobility: Needs Assistance Bed Mobility: Sit to Supine       Sit to supine: Mod assist   General bed mobility comments: mod A for BLE back into bed, very painful for Pt to complete this movement and required mod A for positioning in bed as well  Transfers Overall transfer level: Needs assistance Equipment used: 1 person hand held assist Transfers: Sit to/from Stand Sit to Stand: Mod assist         General transfer comment: mod A for power up and balance    Balance Overall balance assessment: Needs assistance Sitting-balance support: Feet supported;Bilateral upper extremity supported Sitting balance-Leahy Scale: Fair     Standing balance support: Bilateral upper extremity supported Standing balance-Leahy Scale: Poor Standing balance comment: dependent on therapist for  support/balance                           ADL either performed or assessed with clinical judgement   ADL Overall ADL's : Needs assistance/impaired     Grooming: Set up;Sitting;Wash/dry hands;Wash/dry face Grooming Details (indicate cue type and reason): Pt unable to maintain standing for grooming this session                 Toilet Transfer: Moderate assistance;Stand-pivot;BSC Toilet Transfer Details (indicate cue type and reason): hand held assist for small pivotal steps from [facility] back to bed, VERY unsteady and RLE sliding out from under her with sit to stand requiring assist Toileting- Clothing Manipulation and Hygiene: Moderate assistance;Sit to/from stand Toileting - Clothing Manipulation Details (indicate cue type and reason): Pt able to perform peri care in standing with mod A for balance and mod A to bring underwear up     Functional mobility during ADLs: Moderate assistance(declined use of RW for stand pivot transfer)       Vision       Perception     Praxis      Cognition Arousal/Alertness: Awake/alert Behavior During Therapy: WFL for tasks assessed/performed Overall Cognitive Status: Impaired/Different from baseline Area of Impairment: Awareness;Safety/judgement                         Safety/Judgement: Decreased awareness of safety Awareness: Emergent            Exercises     Shoulder Instructions  General Comments OT pulse Oxymeter not reading O2 levels after transfer, Pt very SOB, encouraged pursed lip breathing and breathing improved. Nausea for Pt with return to bed, provided with blue bag and RN called to be made aware of increased assistance required as well as nausea    Pertinent Vitals/ Pain       Pain Assessment: Faces Faces Pain Scale: Hurts a little bit Pain Location: ribs with movement Pain Descriptors / Indicators: Grimacing;Guarding Pain Intervention(s): Limited activity within patient's  tolerance;Monitored during session;Repositioned  Home Living                                          Prior Functioning/Environment              Frequency  Min 2X/week        Progress Toward Goals  OT Goals(current goals can now be found in the care plan section)  Progress towards OT goals: Not progressing toward goals - comment(increased need of assist, increased pain)  Acute Rehab OT Goals Patient Stated Goal: "go home." OT Goal Formulation: With patient Time For Goal Achievement: 03/26/18 Potential to Achieve Goals: [place] Frequency remains appropriate    Co-evaluation                 AM-PAC PT "6 Clicks" Daily Activity     Outcome Measure   Help from another person eating meals?: None Help from another person taking care of personal grooming?: A Lot Help from another person toileting, which includes using toliet, bedpan, or urinal?: A Lot Help from another person bathing (including washing, rinsing, drying)?: A Lot Help from another person to put on and taking off regular upper body clothing?: A Little Help from another person to put on and taking off regular lower body clothing?: A Lot 6 Click Score: 15    End of Session Equipment Utilized During Treatment: Gait belt;Oxygen  OT Visit Diagnosis: Unsteadiness on feet (R26.81);Muscle weakness (generalized) (M62.81)   Activity Tolerance Patient limited by fatigue;Patient limited by pain   Patient Left in bed;with call bell/phone within reach   Nurse Communication Mobility status(SOB with pulse Ox not reading, increased need for assist)        Time: 3735-7897 OT Time Calculation (min): 15 min  Charges: OT General Charges $OT Visit: 1 Visit OT Treatments $Self Care/Home Management : 8-22 mins  Hulda Humphrey OTR/L Acute Rehabilitation Services Pager: [phone] Office: [place] 03/21/2018, 3:15 PM

## 2018-03-22 ENCOUNTER — Non-Acute Institutional Stay: Payer: Medicare Other | Admitting: Nurse Practitioner

## 2018-03-22 ENCOUNTER — Encounter: Payer: Self-pay | Admitting: Pulmonary Disease

## 2018-03-22 ENCOUNTER — Ambulatory Visit (INDEPENDENT_AMBULATORY_CARE_PROVIDER_SITE_OTHER): Payer: Medicare Other | Admitting: Pulmonary Disease

## 2018-03-22 VITALS — Resp 24

## 2018-03-22 VITALS — BP 100/62 | HR 74 | Ht 66.0 in | Wt 141.0 lb

## 2018-03-22 DIAGNOSIS — M353 Polymyalgia rheumatica: Secondary | ICD-10-CM | POA: Diagnosis not present

## 2018-03-22 DIAGNOSIS — Z7901 Long term (current) use of anticoagulants: Secondary | ICD-10-CM

## 2018-03-22 DIAGNOSIS — J9620 Acute and chronic respiratory failure, unspecified whether with hypoxia or hypercapnia: Secondary | ICD-10-CM | POA: Insufficient documentation

## 2018-03-22 DIAGNOSIS — J439 Emphysema, unspecified: Secondary | ICD-10-CM

## 2018-03-22 DIAGNOSIS — R0602 Shortness of breath: Secondary | ICD-10-CM

## 2018-03-22 DIAGNOSIS — J9611 Chronic respiratory failure with hypoxia: Secondary | ICD-10-CM

## 2018-03-22 DIAGNOSIS — F0390 Unspecified dementia without behavioral disturbance: Secondary | ICD-10-CM | POA: Diagnosis not present

## 2018-03-22 DIAGNOSIS — Z515 Encounter for palliative care: Secondary | ICD-10-CM

## 2018-03-22 DIAGNOSIS — R269 Unspecified abnormalities of gait and mobility: Secondary | ICD-10-CM

## 2018-03-22 DIAGNOSIS — R531 Weakness: Secondary | ICD-10-CM

## 2018-03-22 DIAGNOSIS — F339 Major depressive disorder, recurrent, unspecified: Secondary | ICD-10-CM

## 2018-03-22 DIAGNOSIS — F419 Anxiety disorder, unspecified: Secondary | ICD-10-CM

## 2018-03-22 LAB — CULTURE, BLOOD (ROUTINE X 2)
CULTURE: NO GROWTH
CULTURE: NO GROWTH

## 2018-03-22 MED ORDER — ALBUTEROL SULFATE (2.5 MG/3ML) 0.083% IN NEBU: 2.5000 mg | INHALATION_SOLUTION | Freq: Three times a day (TID) | RESPIRATORY_TRACT | 12 refills | Status: DC

## 2018-03-22 NOTE — Assessment & Plan Note (Signed)
Discussed mental status with primary care at 03/26/2018 office visit

## 2018-03-22 NOTE — Patient Instructions (Addendum)
Walk in office today to check oxygenation levels >>> Required 3 L with walking  We will place a prescription for your oxygen to be at 3 to 4 L >>> Can titrate oxygen as needed to maintain oxygen saturations greater than 90% >>> Contact our office if oxygenation is remaining below 88%  Try to eat a healthy nutritious diet >>>can add Ensure high-protein boost  Continue Anoro Ellipta  >>> Take 1 puff daily in the morning right when you wake up >>>Rinse your mouth out after use >>>This is a daily maintenance inhaler, NOT a rescue inhaler >>>Contact our office if you are having difficulties affording or obtaining this medication >>>It is important for you to be able to take this daily and not miss any doses  Finish daily prednisone of 40mg  from hospital discharge Continue daily prednisone of 10 mg  Consider hospice  Contact palliative care to notify your discharge from the hospital as well as the changes with oxygen  Check with [facility] to see if they have these medications available: Candiss Norse  Performist   >>> Also check with facility to ensure that they could start and manage these medications if needed >>> If we provided samples with the facility be able to administer these medications if they were samples   If admitted to the hospital again request a pulmonary consult   It is flu season:   >>>Remember to be washing your hands regularly, using hand sanitizer, be careful to use around herself with has contact with people who are sick will increase her chances of getting sick yourself. >>> Best ways to protect herself from the flu: Receive the yearly flu vaccine, practice good hand hygiene washing with soap and also using hand sanitizer when available, eat a nutritious meals, get adequate rest, hydrate appropriately   Please contact the office if your symptoms worsen or you have concerns that you are not improving.   Thank you for choosing [facility] for  your healthcare, and for allowing [place] to partner with you on your healthcare journey. I am thankful to be able to provide care to you today.   Wyn Quaker FNP-C

## 2018-03-22 NOTE — Assessment & Plan Note (Signed)
Continue daily 10 mg prednisone after completing 5 days of 40 mg daily from hospital discharge

## 2018-03-22 NOTE — Assessment & Plan Note (Signed)
Walk in office today to check oxygenation levels >>> Required 3 L with walking  We will place a prescription for your oxygen to be at 3 to 4 L >>> Can titrate oxygen as needed to maintain oxygen saturations greater than 90% >>> Contact our office if oxygenation is remaining below 88%   If admitted to the hospital again request a pulmonary consult

## 2018-03-22 NOTE — Assessment & Plan Note (Signed)
Continue follow-up with palliative care Consider transitioning to hospice

## 2018-03-22 NOTE — Progress Notes (Signed)
@Patient  ID: [name], female    DOB: [birth year], 84 y.o.   MRN: [id number]  Chief Complaint  Patient presents with  . Hospitalization Follow-up    Hosp Follow up - COPD exacerbation     Referring provider: Lajean Manes, MD  HPI:  84 year old female patient followed in our office for COPD   PMH: complete heart block status post pacemaker, diastolic heart failure, paroxysmal A. fib, and polymyalgia rheumatica Smoker/ Smoking History: Former smoker.  Quit July/2018.  30-pack-year smoking history. Maintenance: Anoro Ellipta Pt of: Dr. Vaughan Browner   Recent [facility] Encounters:   01/23/2018-office visit-[place] Patient was hospitalized in early June 09/2017 for right middle lobe infiltrate, positive rhinovirus, acute hypoxic respiratory failure.  She was discharged on supplemental oxygen.  Lasix dose was increased recently due to lower extremity edema.  In office today patient reports that breathing is at baseline.  Patient is frustrated with oxygen wants to stop it.  Continues Anoro Ellipta.  Continues under palliative care at her nursing home. Plan: Continue Anoro, duo nebs as needed, continue supplemental oxygen, CODE STATUS DNR, continue palliative care nursing home  03/22/2018  - Visit   84 year old female patient presenting today for hospital follow-up.  Patient was recently discharged on 03/21/2018 from the hospital with a COPD exacerbation.  Patient presents today with her daughter.  Patient's daughter has multiple concerns regarding patient's medications, care, plan of care, breathing.  Patient's daughter reports that the patient is sleeping more, has increased confusion, poor diet, is more of a flat affect.  Patient daughter reports that palliative care is involved but has not been as responsive as they would have liked.  Patient's daughter is reporting that prior to being hospitalized for COPD exacerbation patient's oxygen levels were dropping into the high 60s.  Patient's  daughter reports that patient's oxygenation at her nursing facility was 84% today (03/22/2018) on 2 L of oxygen.  Patient completed a course of azithromycin in the hospital.  Patient is also currently on day 2 of her daily 40 mg prednisone for the next 5 days.  Then patient will resume 10 mg prednisone daily.  Patient had previously been off of blood thinner for a year now.  Blood thinner was initially stopped as a pro versus con regarding patient's high fall risk.  Blood thinner, Xarelto, had to be restarted 2 weeks ago when patient was diagnosed with a right calf DVT.  They restarted patient on Xarelto.  Patient reports adherence to medications.  Patient reports adherence to Anoro Ellipta.  Patient's daughter reports that her DuoNeb's were stopped by the hospitalist during hospitalization.  Patient was placed back on albuterol as needed.  Patient's daughter is unsure why.  Patient is still working with palliative care.  But there is been continued discussion per patient's daughter about whether or not the patient should be hospice care.  Patient's daughter reports she has multiple questions regarding this today.  Patient continues to be apprehensive with hospice or starting on hospice.   Tests:   bronchoscopy on 10/07/16 which did not show any endobronchial lesion. There is purulent secretions in the right lung which was sent for culture. The microbiology shows Pseudomonas and viridans strep which was treated adequately with antibiotics. The AFB culture grew MAI.  Bronchoscopy culture 10/07/16-  Pseudomonas aeruginosa Strep viridans MAI  Echocardiogram 10/12/14 Apical hypokinesis with mildly reduced LV systolic function; mild  LVH; sclerotic aortic valve with mild AI; mild MR; mild biatrial  enlargement; mild RVE; mild TR;  mildly elevated pulmonary  pressure.  CT chest 10/05/16 - trace right effusion, multiple rib fractures on R, extensive consolidative changes and RML / LL and LL with  narrowing of right mainstem bronchus. Subcarinal lymph node.  CT angiogram 01/02/17-no pulmonary medicine, improved infiltrates in the right lower lobe and right middle lobe. Scattered atelectasis, bronchial wall thickening, small effusions CT chest 04/22/17-minimal pleural effusion, lower lobe opacities consistent with atelectasis with scattered groundglass. CT chest 11/20/17- consolidation of the right middle lobe Chest x-ray 01/22/2018- chronic bronchitis changes.  No residual infiltrate noted. I had reviewed the images personally.  PFTs 03/24/17 FVC 2.19 (62%], FEV1 1.61 [54%], F/F 73, TLC 92%, RV/TLC 141%, DLCO 44% Moderate severe obstruction with air trapping, severe diffusion defect.  CBC differential 04/21/17- absolute eosinophil count < 150  Swallow eval 04/22/17-mild to moderate oropharyngeal dysphagia  Overnight oximetry 05/16/17 Total time 4 hours O2 sat 90% average, low 88%. Pulse 73 average, highest level recorded 109. There are no desaturations.  No oxygen required.  Chart Review:  03/17/2018-hospitalization >>>Discharge date 03/21/2018 >>>COPD exacerbation >>>Follow-up with PCP in 1 week >>>Follow-up with pulmonologist as 1 to 2 months >>>Patient discharged with 5 days of oral prednisone at 40 mg daily and then to resume chronic home dose of 10 mg daily    Specialty Problems      Pulmonary Problems   CAP (community acquired pneumonia)   COPD exacerbation ([place])   GOLD COPD II D    PFTs 03/24/17 FVC 2.19 (62%], FEV1 1.61 [54%], F/F 73, TLC 92%, RV/TLC 141%, DLCO 44% Moderate severe obstruction with air trapping, severe diffusion defect.  03/17/18 >>> Hosp >>> COPD exac       HCAP (healthcare-associated pneumonia)   Acute hypoxemic respiratory failure (HCC)   Shortness of breath   Chronic pulmonary aspiration   Right middle lobe pneumonia (HCC)   Acute on chronic respiratory failure (HCC)    Overnight oximetry 05/16/17 Total time 4 hours O2 sat 90% average, low  88%. Pulse 73 average, highest level recorded 109. There are no desaturations.  No oxygen required.  03/22/18 >>> Patient walked in office today on 2 L.  On 2 L while ambulating patient's oxygen levels dropped to 86%.  Patient's oxygen was increased to 3 L were patient maintain oxygen saturations of 93%.  Patient completed one lap          No Known Allergies  Immunization History  Administered Date(s) Administered  . Influenza, High Dose Seasonal PF 04/11/2016  . Tdap 07/07/2017    Past Medical History:  Diagnosis Date  . CHB (complete heart block) ([place]) 07/13/2013   Pacemaker dependent  . CHF (congestive heart failure) ([place])   . COPD (chronic obstructive pulmonary disease) ([place])   . DM (dermatomyositis)   . Orthostatic hypotension   . Pacemaker 07/13/2013   Her original pacemaker and the current leads were implanted in 1992. She has had 2 generator change out, most recently in 2010. Her device is a Buyer, retail 2110 non RF dual-chamber pacemaker with a battery longevity estimated at about 7 years. The atrial lead is a St. Jude [id number] and the ventricular lead was a Biotronik [id number].   [name] Paroxysmal atrial fibrillation ([place]) 09/04/2014   on Eliquis  . Polymyalgia rheumatica ([place])   . Scoliosis   . SSS (sick sinus syndrome) ([place]) 07/13/2013  . Syncope   . Systemic hypertension   . Thoracic kyphosis     Tobacco History: Social History   Tobacco Use  Smoking Status Former Smoker  . Packs/day: 0.50  . Years: 60.00  . Pack years: 30.00  . Types: Cigarettes  Smokeless Tobacco Never Used  Tobacco Comment   quit 12/2016   Counseling given: Yes Comment: quit 12/2016  Continue to not smoke  Outpatient Encounter Medications as of 03/22/2018  Medication Sig  . acetaminophen (TYLENOL) 325 MG tablet Take 325 mg by mouth every 6 (six) hours as needed for mild pain, moderate pain or fever.   [name] alendronate (FOSAMAX) 70 MG tablet Take 70 mg by mouth every Wednesday. Take with a full  glass of water on an empty stomach.   . escitalopram (LEXAPRO) 10 MG tablet Take 10 mg by mouth daily.  . furosemide (LASIX) 40 MG tablet Take 0.5 tablets (20 mg total) daily by mouth. (Patient taking differently: Take 20 mg by mouth daily as needed (for over 2lbs weight increase). )  . guaiFENesin-dextromethorphan (ROBITUSSIN DM) 100-10 MG/5ML syrup Take 10 mLs by mouth every 4 (four) hours as needed for cough.  [name] HYDROcodone-acetaminophen (NORCO/VICODIN) 5-325 MG tablet Take 1-2 tablets every 4 (four) hours as needed by mouth for moderate pain. (Patient taking differently: Take 1 tablet by mouth every 4 (four) hours as needed for moderate pain. )  . ipratropium-albuterol (DUONEB) 0.5-2.5 (3) MG/3ML SOLN Take 3 mLs by nebulization every 6 (six) hours as needed.  . metoprolol succinate (TOPROL-XL) 25 MG 24 hr tablet Take 25 mg by mouth daily.   . midodrine (PROAMATINE) 2.5 MG tablet Take 2.5 mg by mouth as needed (when systolic BP is lower than 100).   . mirtazapine (REMERON) 15 MG tablet Take 7.5 mg by mouth at bedtime.   . ondansetron (ZOFRAN) 4 MG tablet Take 4 mg by mouth every 6 (six) hours as needed for nausea or vomiting.  . OXYGEN Inhale 2 L into the lungs continuous.  . potassium chloride SA (K-DUR,KLOR-CON) 20 MEQ tablet Take 1 tablet (20 mEq total) daily by mouth. (Patient taking differently: Take 20 mEq by mouth daily as needed (with Lasix). )  . [START ON 03/27/2018] predniSONE (DELTASONE) 10 MG tablet Take 1 tablet (10 mg total) by mouth daily.  . Rivaroxaban (XARELTO) 15 MG TABS tablet Take 15 mg by mouth 2 (two) times daily with a meal. Take for 21 days  . umeclidinium-vilanterol (ANORO ELLIPTA) 62.5-25 MCG/INH AEPB Inhale 1 puff into the lungs daily.  . [DISCONTINUED] furosemide (LASIX) 40 MG tablet Take 40 mg by mouth daily.  . [DISCONTINUED] predniSONE (DELTASONE) 20 MG tablet Take 2 tablets (40 mg total) by mouth daily with breakfast for 5 days.  . feeding supplement, ENSURE  ENLIVE, (ENSURE ENLIVE) LIQD Take 237 mLs by mouth daily. (Patient not taking: Reported on 03/22/2018)   No facility-administered encounter medications on file as of 03/22/2018.      Review of Systems  Review of Systems  Constitutional: Positive for activity change (Severe deconditioning), appetite change (Decreased appetite) and fatigue. Negative for chills, fever and unexpected weight change.  HENT: Negative for congestion, ear pain, postnasal drip, sinus pressure and sinus pain.   Respiratory: Positive for cough (very hard to produce sputum, sputum is clear ) and shortness of breath. Negative for chest tightness and wheezing.   Cardiovascular: Positive for leg swelling (Chronic). Negative for chest pain and palpitations.  Gastrointestinal: Negative for blood in stool, diarrhea, nausea and vomiting.  Genitourinary: Negative for dysuria, frequency and urgency.  Musculoskeletal: Negative for arthralgias.  Skin: Positive for wound (multiple, Left wound / Right  wound, bilateral LE extremity, ). Negative for color change.  Allergic/Immunologic: Negative for environmental allergies and food allergies.  Neurological: Positive for weakness. Negative for dizziness, light-headedness and headaches.  Psychiatric/Behavioral: Positive for confusion. Negative for dysphoric mood. The patient is not nervous/anxious.   All other systems reviewed and are negative.    Physical Exam  BP 100/62 (BP Location: Left Arm, Cuff Size: Normal)   Pulse 74   Ht 5\' 6"  (1.676 m)   Wt 141 lb (64 kg)   SpO2 94%   BMI 22.76 kg/m   Wt Readings from Last 5 Encounters:  03/22/18 141 lb (64 kg)  03/21/18 141 lb 1.5 oz (64 kg)  03/02/18 144 lb (65.3 kg)  01/23/18 139 lb (63 kg)  12/27/17 139 lb 14.4 oz (63.5 kg)   2 L via nasal cannula  Physical Exam  Constitutional: She is oriented to person, place, and time. Vital signs are normal. She appears unhealthy. She appears cachectic. No distress.  HENT:  Head:  Normocephalic and atraumatic.  Right Ear: Hearing, tympanic membrane, external ear and ear canal normal.  Left Ear: Hearing, tympanic membrane, external ear and ear canal normal.  Nose: Nose normal. Right sinus exhibits no maxillary sinus tenderness and no frontal sinus tenderness. Left sinus exhibits no maxillary sinus tenderness and no frontal sinus tenderness.  Mouth/Throat: Uvula is midline and oropharynx is clear and moist. No oropharyngeal exudate.  Eyes: Pupils are equal, round, and reactive to light.  Neck: Normal range of motion. Neck supple. No JVD present.  Cardiovascular: Normal rate, regular rhythm and normal heart sounds.  Pulmonary/Chest: Effort normal and breath sounds normal. No accessory muscle usage. No respiratory distress. She has no decreased breath sounds. She has no wheezes. She has no rhonchi.  Decreased breath sounds throughout exam  Abdominal: Soft. Bowel sounds are normal. There is no tenderness.  Musculoskeletal: Normal range of motion. She exhibits edema (1+ lower extremity edema).  In wheelchair for exam  Lymphadenopathy:    She has no cervical adenopathy.  Neurological: She is alert and oriented to person, place, and time.  Ambulates with walker  Skin: Skin is warm and dry. She is not diaphoretic. No erythema.  Multiple covered skin tears throughout exam.  Bilateral knee on both forearms, bilaterally on both lower extremities.  Patient reports this is managed by [facility].  Psychiatric: Mood, memory, affect and judgment normal.  Nursing note and vitals reviewed.    Patient walked in office today on 2 L.  On 2 L while ambulating patient's oxygen levels dropped to 86%.  Patient's oxygen was increased to 3 L were patient maintain oxygen saturations of 93%.  Patient completed one lap  Lab Results:  CBC    Component Value Date/Time   WBC 17.6 (H) 03/21/2018 0304   RBC 4.12 03/21/2018 0304   HGB 11.4 (L) 03/21/2018 0304   HCT 38.6 03/21/2018 0304     HCT 34.3 11/22/2017 0737   PLT 329 03/21/2018 0304   MCV 93.7 03/21/2018 0304   MCH 27.7 03/21/2018 0304   MCHC 29.5 (L) 03/21/2018 0304   RDW 16.1 (H) 03/21/2018 0304   LYMPHSABS 2.3 03/17/2018 0957   MONOABS 1.2 (H) 03/17/2018 0957   EOSABS 0.1 03/17/2018 0957   BASOSABS 0.1 03/17/2018 0957    BMET    Component Value Date/Time   NA 139 03/21/2018 0304   K 4.3 03/21/2018 0304   CL 98 03/21/2018 0304   CO2 35 (H) 03/21/2018 0304  GLUCOSE 153 (H) 03/21/2018 0304   BUN 17 03/21/2018 0304   CREATININE 0.91 03/21/2018 0304   CREATININE 0.84 11/04/2015 1522   CALCIUM 8.9 03/21/2018 0304   GFRNONAA 56 (L) 03/21/2018 0304   GFRAA >60 03/21/2018 0304    BNP    Component Value Date/Time   BNP 695.9 (H) 03/17/2018 0957   BNP 525.6 (H) 11/04/2015 1522    ProBNP No results found for: PROBNP  Imaging: Dg Chest Port 1 View  Result Date: 03/20/2018 CLINICAL DATA:  Follow-up. EXAM: PORTABLE CHEST 1 VIEW COMPARISON:  Radiograph of March 17, 2018. FINDINGS: Stable cardiomegaly. No pneumothorax is noted. Right-sided PICC line is unchanged in position. Atherosclerosis of thoracic aorta is noted. Stable bibasilar subsegmental atelectasis is noted with small pleural effusions. Bony thorax is unremarkable. IMPRESSION: Stable cardiomegaly. Stable bibasilar subsegmental atelectasis with small pleural effusions. Aortic Atherosclerosis (ICD10-I70.0). Electronically Signed   By: Marijo Conception, M.D.   On: 03/20/2018 07:53   Dg Chest Port 1 View  Result Date: 03/17/2018 CLINICAL DATA:  Shortness of breath this morning. Wheezing and rales. On BiPAP. EXAM: PORTABLE CHEST 1 VIEW COMPARISON:  01/22/2018 and CT chest 11/20/2017. FINDINGS: Trachea is midline. Heart is enlarged. Thoracic aorta is calcified. Pacemaker lead tips project over the right atrium and right ventricle. Small left pleural effusion with interstitial accentuation in the left lower lobe. There may be peripheral septal lines the  right lung base. IMPRESSION: 1. Question mild congestive heart failure. 2. Left lower lobe subsegmental atelectasis. Difficult to exclude an infectious bronchiolitis. Electronically Signed   By: Lorin Picket M.D.   On: 03/17/2018 10:47      Assessment & Plan:   Pleasant 84 year old patient seen today for hospital follow-up.  This is a tough interval for Ms. Cappucci as this is my first time meeting her she seems severely decompensated.  I discussed her case with Dr. Vaughan Browner who is her primary pulmonologist and he agrees that she has been declining over the past year.  I discussed my thoughts with patient's daughter as well as the patient and they both agree patient has been declining over the last year since last October/2018.  This is concerning to me.  There is also consistent confusion regarding patient's plan of care and her medications.  I will have the patient's daughter inquire at her facility to see if the patient could start nebulized LABA and LAMA medications.  If the patient is able to that something we could consider as I doubt the patient has strong enough inspiratory effort to receive adequate Anoro Ellipta at this time.  We will continue Anoro Ellipta for the time being.  Increased patient's oxygen order to 3 to 4 L  to maintain oxygen saturations greater than 90%.  Patient to contact our office if she is unable to maintain oxygen saturations greater than 88%.  I had a long conversation with the patient as well as the patient's daughter regarding the patient's status on palliative care as well as if the patient should transition to hospice.  Although based off of pulmonary function testing October/2018 the patient's not in end-stage COPD, I do believe the patient has significantly declined from my chart review as well as from my patient interview today.  I believe with the patient's severe decompensation as well as increased oxygen needs to the patient could meet requirements for  hospice.  Hospice could also aid the patient as well as patient's family and preserving quality of life and supporting her needs.  Patient is reluctant to start hospice care today.  Patient does agree to think about it and we will readdress this in 1 stop.  Patient has concerns of what others and her friends will think if she gets put on hospice.  We will bring patient back in 1 to 2 weeks to continue to address pulmonary needs.  Patient can present sooner if symptoms worsen or she has additional concerns.  Patient and daughter both agree with plan of care.  I have updated Dr. Vaughan Browner with plan of care today.  GOLD COPD II D Walk in office today to check oxygenation levels >>> Required 3 L with walking  We will place a prescription for your oxygen to be at 3 to 4 L >>> Can titrate oxygen as needed to maintain oxygen saturations greater than 90% >>> Contact our office if oxygenation is remaining below 88%  Try to eat a healthy nutritious diet >>>can add Ensure high-protein boost  Continue Anoro Ellipta  >>> Take 1 puff daily in the morning right when you wake up >>>Rinse your mouth out after use >>>This is a daily maintenance inhaler, NOT a rescue inhaler >>>Contact our office if you are having difficulties affording or obtaining this medication >>>It is important for you to be able to take this daily and not miss any doses  Finish daily prednisone of 40mg  from hospital discharge Continue daily prednisone of 10 mg  Consider hospice  Contact palliative care to notify your discharge from the hospital as well as the changes with oxygen  Check with [facility] to see if they have these medications available: Candiss Norse  Performist  >>> Also check with facility to ensure that they could start and manage these medications if needed >>> If we provided samples with the facility be able to administer these medications if they were samples  If admitted to the hospital again request a  pulmonary consult   Polymyalgia rheumatica ([place]) Continue daily 10 mg prednisone after completing 5 days of 40 mg daily from hospital discharge  Anticoagulant long-term use Continue Xarelto at this time  Encounter for palliative care Continue follow-up with palliative care Consider transitioning to hospice  Dementia Discussed mental status with primary care at 03/26/2018 office visit  Acute on chronic respiratory failure ([place]) Walk in office today to check oxygenation levels >>> Required 3 L with walking  We will place a prescription for your oxygen to be at 3 to 4 L >>> Can titrate oxygen as needed to maintain oxygen saturations greater than 90% >>> Contact our office if oxygenation is remaining below 88%   If admitted to the hospital again request a pulmonary consult   This appointment was 45 minutes along with her 50% that time in direct face-to-face patient care, assessment, plan of care follow-up, discussion of palliative versus hospice   Lauraine Rinne, NP 03/22/2018

## 2018-03-22 NOTE — Assessment & Plan Note (Signed)
Continue Xarelto at this time

## 2018-03-22 NOTE — Assessment & Plan Note (Signed)
Walk in office today to check oxygenation levels >>> Required 3 L with walking  We will place a prescription for your oxygen to be at 3 to 4 L >>> Can titrate oxygen as needed to maintain oxygen saturations greater than 90% >>> Contact our office if oxygenation is remaining below 88%  Try to eat a healthy nutritious diet >>>can add Ensure high-protein boost  Continue Anoro Ellipta  >>> Take 1 puff daily in the morning right when you wake up >>>Rinse your mouth out after use >>>This is a daily maintenance inhaler, NOT a rescue inhaler >>>Contact our office if you are having difficulties affording or obtaining this medication >>>It is important for you to be able to take this daily and not miss any doses  Finish daily prednisone of 40mg  from hospital discharge Continue daily prednisone of 10 mg  Consider hospice  Contact palliative care to notify your discharge from the hospital as well as the changes with oxygen  Check with [facility] to see if they have these medications available: Candiss Norse  Performist  >>> Also check with facility to ensure that they could start and manage these medications if needed >>> If we provided samples with the facility be able to administer these medications if they were samples  If admitted to the hospital again request a pulmonary consult

## 2018-03-23 ENCOUNTER — Encounter: Payer: BLUE CROSS/BLUE SHIELD | Admitting: Nurse Practitioner

## 2018-03-23 ENCOUNTER — Other Ambulatory Visit: Payer: Self-pay | Admitting: Pulmonary Disease

## 2018-03-23 DIAGNOSIS — I5022 Chronic systolic (congestive) heart failure: Secondary | ICD-10-CM | POA: Diagnosis not present

## 2018-03-23 DIAGNOSIS — J9620 Acute and chronic respiratory failure, unspecified whether with hypoxia or hypercapnia: Secondary | ICD-10-CM

## 2018-03-23 DIAGNOSIS — J449 Chronic obstructive pulmonary disease, unspecified: Secondary | ICD-10-CM | POA: Diagnosis not present

## 2018-03-23 DIAGNOSIS — I110 Hypertensive heart disease with heart failure: Secondary | ICD-10-CM | POA: Diagnosis not present

## 2018-03-23 DIAGNOSIS — I5033 Acute on chronic diastolic (congestive) heart failure: Secondary | ICD-10-CM | POA: Diagnosis not present

## 2018-03-23 DIAGNOSIS — S41011D Laceration without foreign body of right shoulder, subsequent encounter: Secondary | ICD-10-CM | POA: Diagnosis not present

## 2018-03-23 DIAGNOSIS — I270 Primary pulmonary hypertension: Secondary | ICD-10-CM | POA: Diagnosis not present

## 2018-03-23 MED ORDER — ALBUTEROL SULFATE (2.5 MG/3ML) 0.083% IN NEBU: 2.5000 mg | INHALATION_SOLUTION | Freq: Three times a day (TID) | RESPIRATORY_TRACT | 12 refills | Status: AC

## 2018-03-26 ENCOUNTER — Encounter (HOSPITAL_COMMUNITY): Payer: Self-pay | Admitting: Internal Medicine

## 2018-03-26 ENCOUNTER — Emergency Department (HOSPITAL_COMMUNITY): Payer: Medicare Other

## 2018-03-26 ENCOUNTER — Encounter: Payer: Self-pay | Admitting: Nurse Practitioner

## 2018-03-26 ENCOUNTER — Other Ambulatory Visit: Payer: Self-pay

## 2018-03-26 ENCOUNTER — Encounter: Payer: Medicare Other | Admitting: Nurse Practitioner

## 2018-03-26 ENCOUNTER — Encounter (HOSPITAL_COMMUNITY): Payer: Self-pay

## 2018-03-26 ENCOUNTER — Inpatient Hospital Stay (HOSPITAL_COMMUNITY)
Admission: EM | Admit: 2018-03-26 | Discharge: 2018-03-28 | DRG: 190 | Disposition: A | Discharge status: Skilled Nursing Facility | Payer: Medicare Other | Attending: Internal Medicine | Admitting: Internal Medicine

## 2018-03-26 DIAGNOSIS — I5033 Acute on chronic diastolic (congestive) heart failure: Secondary | ICD-10-CM | POA: Diagnosis present

## 2018-03-26 DIAGNOSIS — J9622 Acute and chronic respiratory failure with hypercapnia: Secondary | ICD-10-CM | POA: Diagnosis present

## 2018-03-26 DIAGNOSIS — Z7401 Bed confinement status: Secondary | ICD-10-CM | POA: Diagnosis not present

## 2018-03-26 DIAGNOSIS — J90 Pleural effusion, not elsewhere classified: Secondary | ICD-10-CM | POA: Diagnosis not present

## 2018-03-26 DIAGNOSIS — Z87891 Personal history of nicotine dependence: Secondary | ICD-10-CM

## 2018-03-26 DIAGNOSIS — Z7901 Long term (current) use of anticoagulants: Secondary | ICD-10-CM

## 2018-03-26 DIAGNOSIS — J441 Chronic obstructive pulmonary disease with (acute) exacerbation: Secondary | ICD-10-CM | POA: Diagnosis present

## 2018-03-26 DIAGNOSIS — I110 Hypertensive heart disease with heart failure: Secondary | ICD-10-CM | POA: Diagnosis present

## 2018-03-26 DIAGNOSIS — R4182 Altered mental status, unspecified: Secondary | ICD-10-CM | POA: Diagnosis not present

## 2018-03-26 DIAGNOSIS — J9621 Acute and chronic respiratory failure with hypoxia: Secondary | ICD-10-CM | POA: Diagnosis not present

## 2018-03-26 DIAGNOSIS — I10 Essential (primary) hypertension: Secondary | ICD-10-CM | POA: Diagnosis not present

## 2018-03-26 DIAGNOSIS — J440 Chronic obstructive pulmonary disease with acute lower respiratory infection: Principal | ICD-10-CM | POA: Diagnosis present

## 2018-03-26 DIAGNOSIS — Z66 Do not resuscitate: Secondary | ICD-10-CM

## 2018-03-26 DIAGNOSIS — I480 Paroxysmal atrial fibrillation: Secondary | ICD-10-CM | POA: Diagnosis present

## 2018-03-26 DIAGNOSIS — M353 Polymyalgia rheumatica: Secondary | ICD-10-CM | POA: Diagnosis present

## 2018-03-26 DIAGNOSIS — M3313 Other dermatomyositis without myopathy: Secondary | ICD-10-CM | POA: Diagnosis present

## 2018-03-26 DIAGNOSIS — I951 Orthostatic hypotension: Secondary | ICD-10-CM | POA: Diagnosis present

## 2018-03-26 DIAGNOSIS — R609 Edema, unspecified: Secondary | ICD-10-CM | POA: Diagnosis not present

## 2018-03-26 DIAGNOSIS — Z9981 Dependence on supplemental oxygen: Secondary | ICD-10-CM | POA: Diagnosis not present

## 2018-03-26 DIAGNOSIS — R0902 Hypoxemia: Secondary | ICD-10-CM | POA: Diagnosis not present

## 2018-03-26 DIAGNOSIS — G9341 Metabolic encephalopathy: Secondary | ICD-10-CM | POA: Diagnosis present

## 2018-03-26 DIAGNOSIS — J189 Pneumonia, unspecified organism: Secondary | ICD-10-CM | POA: Diagnosis not present

## 2018-03-26 DIAGNOSIS — Y95 Nosocomial condition: Secondary | ICD-10-CM | POA: Diagnosis present

## 2018-03-26 DIAGNOSIS — Z86718 Personal history of other venous thrombosis and embolism: Secondary | ICD-10-CM | POA: Diagnosis not present

## 2018-03-26 DIAGNOSIS — Z515 Encounter for palliative care: Secondary | ICD-10-CM

## 2018-03-26 DIAGNOSIS — Z950 Presence of cardiac pacemaker: Secondary | ICD-10-CM

## 2018-03-26 DIAGNOSIS — I509 Heart failure, unspecified: Secondary | ICD-10-CM | POA: Diagnosis not present

## 2018-03-26 DIAGNOSIS — Z79899 Other long term (current) drug therapy: Secondary | ICD-10-CM | POA: Diagnosis not present

## 2018-03-26 DIAGNOSIS — R110 Nausea: Secondary | ICD-10-CM | POA: Diagnosis not present

## 2018-03-26 DIAGNOSIS — I5043 Acute on chronic combined systolic (congestive) and diastolic (congestive) heart failure: Secondary | ICD-10-CM | POA: Diagnosis present

## 2018-03-26 DIAGNOSIS — R0609 Other forms of dyspnea: Secondary | ICD-10-CM | POA: Diagnosis not present

## 2018-03-26 DIAGNOSIS — J9602 Acute respiratory failure with hypercapnia: Secondary | ICD-10-CM | POA: Diagnosis not present

## 2018-03-26 DIAGNOSIS — M2550 Pain in unspecified joint: Secondary | ICD-10-CM | POA: Diagnosis not present

## 2018-03-26 DIAGNOSIS — M419 Scoliosis, unspecified: Secondary | ICD-10-CM | POA: Diagnosis present

## 2018-03-26 DIAGNOSIS — J9601 Acute respiratory failure with hypoxia: Secondary | ICD-10-CM

## 2018-03-26 DIAGNOSIS — J129 Viral pneumonia, unspecified: Secondary | ICD-10-CM | POA: Diagnosis present

## 2018-03-26 DIAGNOSIS — I495 Sick sinus syndrome: Secondary | ICD-10-CM | POA: Diagnosis present

## 2018-03-26 DIAGNOSIS — J811 Chronic pulmonary edema: Secondary | ICD-10-CM | POA: Diagnosis not present

## 2018-03-26 DIAGNOSIS — I442 Atrioventricular block, complete: Secondary | ICD-10-CM | POA: Diagnosis present

## 2018-03-26 DIAGNOSIS — R0600 Dyspnea, unspecified: Secondary | ICD-10-CM

## 2018-03-26 DIAGNOSIS — Z7952 Long term (current) use of systemic steroids: Secondary | ICD-10-CM | POA: Diagnosis not present

## 2018-03-26 DIAGNOSIS — Z7189 Other specified counseling: Secondary | ICD-10-CM

## 2018-03-26 LAB — I-STAT VENOUS BLOOD GAS, ED
Acid-Base Excess: 11 mmol/L — ABNORMAL HIGH (ref 0.0–2.0)
Bicarbonate: 39.3 mmol/L — ABNORMAL HIGH (ref 20.0–28.0)
O2 Saturation: 64 %
TCO2: 41 mmol/L — ABNORMAL HIGH (ref 22–32)
pCO2, Ven: 65.4 mmHg — ABNORMAL HIGH (ref 44.0–60.0)
pH, Ven: 7.387 (ref 7.250–7.430)
pO2, Ven: 35 mmHg (ref 32.0–45.0)

## 2018-03-26 LAB — COMPREHENSIVE METABOLIC PANEL
ALT: 14 U/L (ref 0–44)
ANION GAP: 7 (ref 5–15)
AST: 19 U/L (ref 15–41)
Albumin: 2.8 g/dL — ABNORMAL LOW (ref 3.5–5.0)
Alkaline Phosphatase: 85 U/L (ref 38–126)
BUN: 15 mg/dL (ref 8–23)
CO2: 31 mmol/L (ref 22–32)
Calcium: 8.8 mg/dL — ABNORMAL LOW (ref 8.9–10.3)
Chloride: 97 mmol/L — ABNORMAL LOW (ref 98–111)
Creatinine, Ser: 0.79 mg/dL (ref 0.44–1.00)
Glucose, Bld: 144 mg/dL — ABNORMAL HIGH (ref 70–99)
POTASSIUM: 4 mmol/L (ref 3.5–5.1)
SODIUM: 135 mmol/L (ref 135–145)
Total Bilirubin: 1.3 mg/dL — ABNORMAL HIGH (ref 0.3–1.2)
Total Protein: 6.8 g/dL (ref 6.5–8.1)

## 2018-03-26 LAB — CBC WITH DIFFERENTIAL/PLATELET
ABS IMMATURE GRANULOCYTES: 0.4 10*3/uL — AB (ref 0.0–0.1)
Basophils Absolute: 0.1 10*3/uL (ref 0.0–0.1)
Basophils Relative: 0 %
Eosinophils Absolute: 0.1 10*3/uL (ref 0.0–0.7)
Eosinophils Relative: 0 %
HEMATOCRIT: 40.8 % (ref 36.0–46.0)
HEMOGLOBIN: 12 g/dL (ref 12.0–15.0)
IMMATURE GRANULOCYTES: 2 %
LYMPHS PCT: 11 %
Lymphs Abs: 2.4 10*3/uL (ref 0.7–4.0)
MCH: 28.2 pg (ref 26.0–34.0)
MCHC: 29.4 g/dL — ABNORMAL LOW (ref 30.0–36.0)
MCV: 95.8 fL (ref 78.0–100.0)
Monocytes Absolute: 1.5 10*3/uL — ABNORMAL HIGH (ref 0.1–1.0)
Monocytes Relative: 7 %
NEUTROS ABS: 17.1 10*3/uL — AB (ref 1.7–7.7)
NEUTROS PCT: 80 %
Platelets: 461 10*3/uL — ABNORMAL HIGH (ref 150–400)
RBC: 4.26 MIL/uL (ref 3.87–5.11)
RDW: 15.7 % — ABNORMAL HIGH (ref 11.5–15.5)
WBC: 21.5 10*3/uL — ABNORMAL HIGH (ref 4.0–10.5)

## 2018-03-26 LAB — I-STAT CG4 LACTIC ACID, ED: LACTIC ACID, VENOUS: 1.76 mmol/L (ref 0.5–1.9)

## 2018-03-26 LAB — PROCALCITONIN: PROCALCITONIN: 0.89 ng/mL

## 2018-03-26 LAB — BRAIN NATRIURETIC PEPTIDE: B Natriuretic Peptide: 836.3 pg/mL — ABNORMAL HIGH (ref 0.0–100.0)

## 2018-03-26 LAB — I-STAT TROPONIN, ED: TROPONIN I, POC: 0.01 ng/mL (ref 0.00–0.08)

## 2018-03-26 LAB — MRSA PCR SCREENING: MRSA by PCR: POSITIVE — AB

## 2018-03-26 MED ORDER — FUROSEMIDE 10 MG/ML IJ SOLN
40.0000 mg | Freq: Two times a day (BID) | INTRAMUSCULAR | Status: DC
  Administered 2018-03-26 – 2018-03-28 (×4): 40 mg via INTRAVENOUS
  Filled 2018-03-26 (×4): qty 4

## 2018-03-26 MED ORDER — UMECLIDINIUM-VILANTEROL 62.5-25 MCG/INH IN AEPB
1.0000 | INHALATION_SPRAY | Freq: Every day | RESPIRATORY_TRACT | Status: DC
  Filled 2018-03-26: qty 14

## 2018-03-26 MED ORDER — RIVAROXABAN 20 MG PO TABS: 20.0000 mg | ORAL_TABLET | Freq: Every day | ORAL | Status: DC

## 2018-03-26 MED ORDER — IPRATROPIUM-ALBUTEROL 0.5-2.5 (3) MG/3ML IN SOLN
3.0000 mL | Freq: Four times a day (QID) | RESPIRATORY_TRACT | Status: DC
  Administered 2018-03-26 – 2018-03-27 (×6): 3 mL via RESPIRATORY_TRACT
  Filled 2018-03-26 (×6): qty 3

## 2018-03-26 MED ORDER — ALBUTEROL SULFATE (2.5 MG/3ML) 0.083% IN NEBU: 2.5000 mg | INHALATION_SOLUTION | RESPIRATORY_TRACT | Status: DC | PRN

## 2018-03-26 MED ORDER — ARFORMOTEROL TARTRATE 15 MCG/2ML IN NEBU
15.0000 ug | INHALATION_SOLUTION | Freq: Two times a day (BID) | RESPIRATORY_TRACT | Status: DC
  Administered 2018-03-26 – 2018-03-28 (×4): 15 ug via RESPIRATORY_TRACT
  Filled 2018-03-26 (×4): qty 2

## 2018-03-26 MED ORDER — SODIUM CHLORIDE 0.9 % IV SOLN: 250.0000 mL | INTRAVENOUS | Status: DC | PRN

## 2018-03-26 MED ORDER — ONDANSETRON HCL 4 MG/2ML IJ SOLN: 4.0000 mg | Freq: Four times a day (QID) | INTRAMUSCULAR | Status: DC | PRN

## 2018-03-26 MED ORDER — ESCITALOPRAM OXALATE 10 MG PO TABS
10.0000 mg | ORAL_TABLET | Freq: Every day | ORAL | Status: DC
  Administered 2018-03-26 – 2018-03-28 (×3): 10 mg via ORAL
  Filled 2018-03-26 (×3): qty 1

## 2018-03-26 MED ORDER — CHLORHEXIDINE GLUCONATE CLOTH 2 % EX PADS
6.0000 | MEDICATED_PAD | Freq: Every day | CUTANEOUS | Status: DC
  Administered 2018-03-28: 6 via TOPICAL

## 2018-03-26 MED ORDER — VANCOMYCIN HCL IN DEXTROSE 1-5 GM/200ML-% IV SOLN
1000.0000 mg | Freq: Once | INTRAVENOUS | Status: AC
  Administered 2018-03-26: 1000 mg via INTRAVENOUS
  Filled 2018-03-26: qty 200

## 2018-03-26 MED ORDER — LORAZEPAM 2 MG/ML IJ SOLN
0.5000 mg | Freq: Once | INTRAMUSCULAR | Status: AC
  Administered 2018-03-26: 0.5 mg via INTRAVENOUS
  Filled 2018-03-26: qty 1

## 2018-03-26 MED ORDER — MORPHINE SULFATE (PF) 2 MG/ML IV SOLN: 2.0000 mg | INTRAVENOUS | Status: DC | PRN

## 2018-03-26 MED ORDER — ASPIRIN EC 81 MG PO TBEC
81.0000 mg | DELAYED_RELEASE_TABLET | Freq: Every day | ORAL | Status: DC
  Administered 2018-03-26 – 2018-03-28 (×3): 81 mg via ORAL
  Filled 2018-03-26 (×3): qty 1

## 2018-03-26 MED ORDER — RIVAROXABAN 15 MG PO TABS
15.0000 mg | ORAL_TABLET | Freq: Two times a day (BID) | ORAL | Status: DC
  Administered 2018-03-26 – 2018-03-27 (×2): 15 mg via ORAL
  Filled 2018-03-26 (×2): qty 1

## 2018-03-26 MED ORDER — ACETAMINOPHEN 325 MG PO TABS: 650.0000 mg | ORAL_TABLET | ORAL | Status: DC | PRN

## 2018-03-26 MED ORDER — SODIUM CHLORIDE 0.9 % IV SOLN
2.0000 g | Freq: Once | INTRAVENOUS | Status: AC
  Administered 2018-03-26: 2 g via INTRAVENOUS
  Filled 2018-03-26: qty 2

## 2018-03-26 MED ORDER — PIPERACILLIN-TAZOBACTAM 3.375 G IVPB
3.3750 g | Freq: Three times a day (TID) | INTRAVENOUS | Status: DC
  Administered 2018-03-26 – 2018-03-27 (×3): 3.375 g via INTRAVENOUS
  Filled 2018-03-26 (×4): qty 50

## 2018-03-26 MED ORDER — METOPROLOL SUCCINATE ER 25 MG PO TB24
25.0000 mg | ORAL_TABLET | Freq: Every day | ORAL | Status: DC
  Administered 2018-03-27 – 2018-03-28 (×2): 25 mg via ORAL
  Filled 2018-03-26 (×3): qty 1

## 2018-03-26 MED ORDER — NITROGLYCERIN IN D5W 200-5 MCG/ML-% IV SOLN
0.0000 ug/min | Freq: Once | INTRAVENOUS | Status: AC
  Administered 2018-03-26: 5 ug/min via INTRAVENOUS
  Filled 2018-03-26: qty 250

## 2018-03-26 MED ORDER — MORPHINE SULFATE (PF) 2 MG/ML IV SOLN: 1.0000 mg | INTRAVENOUS | Status: DC | PRN

## 2018-03-26 MED ORDER — METHYLPREDNISOLONE SODIUM SUCC 40 MG IJ SOLR
40.0000 mg | Freq: Every day | INTRAMUSCULAR | Status: DC
  Administered 2018-03-26 – 2018-03-27 (×2): 40 mg via INTRAVENOUS
  Filled 2018-03-26 (×2): qty 1

## 2018-03-26 MED ORDER — MIRTAZAPINE 7.5 MG PO TABS
7.5000 mg | ORAL_TABLET | Freq: Every day | ORAL | Status: DC
  Administered 2018-03-27: 7.5 mg via ORAL
  Filled 2018-03-26 (×3): qty 1

## 2018-03-26 MED ORDER — ONDANSETRON HCL 4 MG/2ML IJ SOLN: 4.0000 mg | Freq: Once | INTRAMUSCULAR | Status: DC

## 2018-03-26 MED ORDER — ALPRAZOLAM 0.25 MG PO TABS
0.2500 mg | ORAL_TABLET | Freq: Every day | ORAL | Status: DC
  Administered 2018-03-26 – 2018-03-27 (×2): 0.25 mg via ORAL
  Filled 2018-03-26 (×2): qty 1

## 2018-03-26 MED ORDER — SODIUM CHLORIDE 0.9% FLUSH
3.0000 mL | Freq: Two times a day (BID) | INTRAVENOUS | Status: DC
  Administered 2018-03-26 – 2018-03-27 (×3): 3 mL via INTRAVENOUS
  Administered 2018-03-27: 10 mL via INTRAVENOUS

## 2018-03-26 MED ORDER — GUAIFENESIN-DM 100-10 MG/5ML PO SYRP: 10.0000 mL | ORAL_SOLUTION | ORAL | Status: DC | PRN

## 2018-03-26 MED ORDER — SODIUM CHLORIDE 0.9% FLUSH
3.0000 mL | INTRAVENOUS | Status: DC | PRN
  Administered 2018-03-27: 3 mL via INTRAVENOUS
  Filled 2018-03-26: qty 3

## 2018-03-26 MED ORDER — FUROSEMIDE 10 MG/ML IJ SOLN
40.0000 mg | INTRAMUSCULAR | Status: AC
  Administered 2018-03-26: 40 mg via INTRAVENOUS
  Filled 2018-03-26: qty 4

## 2018-03-26 MED ORDER — VANCOMYCIN HCL 500 MG IV SOLR
500.0000 mg | Freq: Two times a day (BID) | INTRAVENOUS | Status: DC
  Administered 2018-03-26 – 2018-03-27 (×2): 500 mg via INTRAVENOUS
  Filled 2018-03-26 (×3): qty 500

## 2018-03-26 MED ORDER — MUPIROCIN 2 % EX OINT
1.0000 "application " | TOPICAL_OINTMENT | Freq: Two times a day (BID) | CUTANEOUS | Status: DC
  Administered 2018-03-27 – 2018-03-28 (×4): 1 via NASAL
  Filled 2018-03-26 (×4): qty 22

## 2018-03-26 MED ORDER — PREDNISONE 20 MG PO TABS: 10.0000 mg | ORAL_TABLET | Freq: Every day | ORAL | Status: DC

## 2018-03-26 NOTE — Care Management Note (Addendum)
Case Management Note  Patient Details  Name: [name] MRN: [id number] Date of Birth: Apr 29, 1931  Subjective/Objective:   From [facility], where she was active with [facility] and [facility] for Palliative.  Presents with  Acute/chronic resp failue secondary to  Combined CHF, copd ex, possible HCAP . Following recent hospitalization, she did see palliative care; however, she was not ready for hospice.  There were plans for her to see PCP and consider transitioning to hospice on 03/26/18.  Unfortunately on 10/7, she had respiratory distress and worsening hypoxia and admitted to hospital.   10/9 [name] RN, BSN - plan is for patient to go back to facility with hospice [facility] via ambulance today.  NCM informed daughter to contact facility to get someone to set with patient, she is contacting facility now.  NCM also contacted [name] with [facility] to inform her that patient will be discharged today via ambulance.  Daughter states she will sit with patient tonight at the facility and she is setting up someone from [facility] or home instead to sit with patient after tonight.            Action/Plan: NCM will follow for transition of care needs.   Expected Discharge Date:                  Expected Discharge Plan:  Assisted Living / Rest Home  In-House Referral:  Clinical Social Work  Discharge planning Services  CM Consult  Post Acute Care Choice:  NA Choice offered to:  NA  DME Arranged:    DME Agency:     HH Arranged:    HH Agency:     Status of Service:  In process, will continue to follow  If discussed at Long Length of Stay Meetings, dates discussed:    Additional Comments:  [name], RN 03/26/2018, 3:15 PM

## 2018-03-26 NOTE — Consult Note (Signed)
NAME:  [name], MRN:  [id number], DOB:  Aug 14, 1930, LOS: 0 ADMISSION DATE:  03/26/2018, CONSULTATION DATE:  03/26/18 REFERRING MD:  Lorin Mercy CHIEF COMPLAINT:  SOB   Brief History   [name] is a 84 y.o. female who is followed as an outpatient by Dr. Vaughan Browner for severe COPD.  Has recently been advised to consider hospice but was not ready.  Now admitted 10/7 with COPD exacerbation and possible HCAP.  Family requesting PCCM input prior to moving / agreeing to palliative / comfort measures.  Significant Hospital Events   10/7 > admit.  Consults: date of consult/date signed off & final recs:  PCCM 10/7 >   Procedures (surgical and bedside):  None.  Significant Diagnostic Tests:  CXR 10/7 > RUL opacity, cardiomegaly with pulmonary edema, small b/l effusions.  Micro Data: Blood 10/7 >   Antimicrobials:  Vanc 10/7 >  Zosyn 10/7 >    Subjective:  On BiPAP, asking for it to be removed as feels uncomfortable.  Objective   Blood pressure 119/63, pulse 81, temperature 97.8 F (36.6 C), temperature source Axillary, resp. rate (!) 35, SpO2 98 %.    FiO2 (%):  [40 %-50 %] 40 %   Intake/Output Summary (Last 24 hours) at 03/26/2018 1340 Last data filed at 03/26/2018 1148 Gross per 24 hour  Intake 202.51 ml  Output -  Net 202.51 ml   There were no vitals filed for this visit.  Examination: General: Adult female, frail, resting in bed, in NAD. Neuro: A&O x 3, no deficits. HEENT: [place]/AT. Sclerae anicteric, BiPAP in place. Cardiovascular: RRR, no M/R/G.  Lungs: Respirations mildly labored.  Coarse crackles throughout. Abdomen: BS x 4, soft, NT/ND.  Musculoskeletal: No gross deformities, no edema.  Skin: Intact, warm, no rashes.  Assessment & Plan:   Gold II D COPD with acute exacerbation - followed by Dr. Vaughan Browner as an outpatient.  Recently seen in clinic 03/22/18 with recommendations to transition to hospice care. Acute on chronic hypercapnic and hypoxemic respiratory  failure - due to above. - Continue BiPAP for now, have told pt and daughter that we will try go give her a 1 hour break later this afternoon (however, informed daughter that I do not think she will tolerate this). - Maintain SpO2 > 88%. - Continue steroids (changed to IV), BD's. - Agree with palliative / comfort measures given her recent decline in respiratory status and general well being / quality of life.  Daughter is in agreement. - Continue morphine PRN. - Agree with DNR / DNI status.  Probable HCAP. - Continue empiric abx and follow cultures. - Bronchial hygiene.  Recent DVT - on xarelto. - Continue xarelto.  AoC systolic heart failure - echo From April 2018 with EF 45 - 50%. Acute pulmonary edema. - Continue lasix as BP and SCr permit. - Strict I/O's.  Rest per primary team.  Disposition / Summary of Today's Plan 03/26/18   SDU under [place]. Continue BiPAP.  Will try give a 1 hour break this afternoon; however, suspect that pt will not tolerate and will need to be placed back on.    Diet: NPO. Pain/Anxiety/Delirium protocol (if indicated): Morphine PRN. VAP protocol (if indicated): N/A. DVT prophylaxis: Xarelto. GI prophylaxis: N/A. Hyperglycemia protocol: N/A. Mobility: Bedrest. Code Status: DNR. Family Communication: Daughter and son in law updated at bedside.  Labs   CBC: Recent Labs  Lab 03/20/18 0310 03/21/18 0304 03/26/18 0818  WBC 18.5* 17.6* 21.5*  NEUTROABS  --   --  17.1*  HGB 11.6* 11.4* 12.0  HCT 38.8 38.6 40.8  MCV 94.2 93.7 95.8  PLT 335 329 161*   Basic Metabolic Panel: Recent Labs  Lab 03/20/18 0310 03/21/18 0304 03/26/18 0818  NA 140 139 135  K 4.3 4.3 4.0  CL 98 98 97*  CO2 35* 35* 31  GLUCOSE 200* 153* 144*  BUN 20 17 15   CREATININE 1.03* 0.91 0.79  CALCIUM 8.9 8.9 8.8*  MG 2.2 2.3  --    GFR: Estimated Creatinine Clearance: 48.1 mL/min (by C-G formula based on SCr of 0.79 mg/dL). Recent Labs  Lab 03/20/18 0310  03/21/18 0304 03/26/18 0818 03/26/18 0825  WBC 18.5* 17.6* 21.5*  --   LATICACIDVEN  --   --   --  1.76   Liver Function Tests: Recent Labs  Lab 03/26/18 0818  AST 19  ALT 14  ALKPHOS 85  BILITOT 1.3*  PROT 6.8  ALBUMIN 2.8*   No results for input(s): LIPASE, AMYLASE in the last 168 hours. No results for input(s): AMMONIA in the last 168 hours. ABG    Component Value Date/Time   PHART 7.397 06/07/2015 0358   PCO2ART 50.8 (H) 06/07/2015 0358   PO2ART 99.2 06/07/2015 0358   HCO3 39.3 (H) 03/26/2018 0823   TCO2 41 (H) 03/26/2018 0823   O2SAT 64.0 03/26/2018 0823    Coagulation Profile: No results for input(s): INR, PROTIME in the last 168 hours. Cardiac Enzymes: No results for input(s): CKTOTAL, CKMB, CKMBINDEX, TROPONINI in the last 168 hours. HbA1C: No results found for: HGBA1C CBG: Recent Labs  Lab 03/20/18 1110 03/20/18 1708 03/20/18 2113 03/21/18 0727 03/21/18 1132  GLUCAP 129* 98 239* 116* 104*    Admitting History of Present Illness.   Pt is on BiPAP due to respiratory distress; therefore, this HPI is obtained from chart review. [name] is a 84 y.o. female who has a PMH as outlined below including but not limited to GOLD II D COPD (PFT's from 03/24/17 with FVC 2.19 (62%], FEV1 1.61 [54%], F/F 73, TLC 92%, RV/TLC 141%, DLCO 44%), with recent decline in her reserve and quality of life.  She is followed in our clinic by Dr. Vaughan Browner and is on chronic O2 (previously 2 - 3L, recent increased to 4L and prednisone 10mg  daily.  She had recent admission 03/17/18 through 03/21/18 for COPD exacerbation and AoC combined heart failure.  Most recently seen as outpatient on 03/22/18 for hospital follow up.  During that appointment, it was noted that pt had certainly had recent decline in her pulmonary status / reserve along with quality of life over the past 1 year or so.  It was recommended that pt see hospice; however, she was reluctant to do so at that time.  She was  instructed to continue with BD's with plans to switch from inhalers to nebs due to concerns that she was not able to effectively use any inhalers.  She also had her baseline O2 increased from 3 to 4L in order to maintain SpO2 > 90%.  Steroids were continued at baseline dose of 10mg  daily (after completion of 40mg  daily following hospital discharge).   Following recent hospitalization, she did see palliative care; however, she was not ready for hospice.  There were plans for her to see PCP and consider transitioning to hospice on 03/26/18.  Unfortunately on 10/7, she had respiratory distress and worsening hypoxia; therefore, EMS was called and pt was brought to [facility] ED for further evaluation.  She  was placed on BiPAP and had improvement in her oxygenation.  She was admitted by [facility] and PCCM was asked to see in consultation given her O2 requirements and per family request prior to considering / transitioning to comfort care.   Review of Systems:   Unable to obtain as pt is on BiPAP.  Past medical history  She,  has a past medical history of CHB (complete heart block) ([place]) (07/13/2013), CHF (congestive heart failure) ([place]), COPD (chronic obstructive pulmonary disease) ([place]), DM (dermatomyositis), Orthostatic hypotension, Pacemaker (07/13/2013), Paroxysmal atrial fibrillation ([place]) (09/04/2014), Polymyalgia rheumatica ([place]), Scoliosis, SSS (sick sinus syndrome) ([place]) (07/13/2013), Syncope, Systemic hypertension, and Thoracic kyphosis.   Surgical History    Past Surgical History:  Procedure Laterality Date  . IR KYPHO THORACIC WITH BONE BIOPSY  01/04/2017  . IR RADIOLOGIST EVAL & MGMT  01/10/2017  . NM MYOCAR PERF WALL MOTION  09/13/2011   Low risk  . PERMANENT PACEMAKER GENERATOR CHANGE  03/27/2009   [place]  . PPM GENERATOR CHANGEOUT N/A 08/09/2017   Procedure: PPM GENERATOR CHANGEOUT;  Surgeon: Sanda Klein, MD;  Location: [place] [facility];  Service: Cardiovascular;  Laterality: N/A;  . US  ECHOCARDIOGRAPHY  03/28/2012   Mod LAE,mild MR,aortic sclerosis w/mod AI,mod. TR,mild PI,Stage I diastolic dysfunction  . VIDEO BRONCHOSCOPY Bilateral 10/07/2016   Procedure: VIDEO BRONCHOSCOPY WITH FLUORO;  Surgeon: Marshell Garfinkel, MD;  Location: [facility] ENDOS[facility];  Service: Cardiopulmonary;  Laterality: Bilateral;     Social History   Social History   Socioeconomic History  . Marital status: Married    Spouse name: Not on file  . Number of children: Not on file  . Years of education: Not on file  . Highest education level: Not on file  Occupational History  . Occupation: retired  Scientific laboratory technician  . Financial resource strain: Not on file  . Food insecurity:    Worry: Not on file    Inability: Not on file  . Transportation needs:    Medical: Not on file    Non-medical: Not on file  Tobacco Use  . Smoking status: Former Smoker    Packs/day: 0.50    Years: 60.00    Pack years: 30.00    Types: Cigarettes  . Smokeless tobacco: Never Used  . Tobacco comment: quit 08/2017  Substance and Sexual Activity  . Alcohol use: No  . Drug use: No  . Sexual activity: Not Currently    Partners: Female  Lifestyle  . Physical activity:    Days per week: Not on file    Minutes per session: Not on file  . Stress: Not on file  Relationships  . Social connections:    Talks on phone: Not on file    Gets together: Not on file    Attends religious service: Not on file    Active member of club or organization: Not on file    Attends meetings of clubs or organizations: Not on file    Relationship status: Not on file  . Intimate partner violence:    Fear of current or ex partner: Not on file    Emotionally abused: Not on file    Physically abused: Not on file    Forced sexual activity: Not on file  Other Topics Concern  . Not on file  Social History Narrative  . Not on file  ,  reports that she has quit smoking. Her smoking use included cigarettes. She has a 30.00 pack-year smoking history. She  has never used  smokeless tobacco. She reports that she does not drink alcohol or use drugs.   Family history   Her family history includes Stroke in her mother.   Allergies No Known Allergies  Home meds  Prior to Admission medications   Medication Sig Start Date End Date Taking? Authorizing Provider  acetaminophen (TYLENOL) 325 MG tablet Take 325 mg by mouth every 6 (six) hours as needed for mild pain, moderate pain or fever.    Yes [provider]  albuterol (PROVENTIL) (2.5 MG/3ML) 0.083% nebulizer solution Take 3 mLs (2.5 mg total) by nebulization 3 (three) times daily. 03/23/18 03/23/19 Yes Martyn Ehrich, NP  alendronate (FOSAMAX) 70 MG tablet Take 70 mg by mouth every Wednesday. Take with a full glass of water on an empty stomach.    Yes [provider]  ALPRAZolam (XANAX) 0.25 MG tablet Take 0.25 mg by mouth at bedtime.   Yes [provider]  escitalopram (LEXAPRO) 10 MG tablet Take 10 mg by mouth daily.   Yes [provider]  feeding supplement, ENSURE ENLIVE, (ENSURE ENLIVE) LIQD Take 237 mLs by mouth daily. 03/21/18  Yes Dhungel, Nishant, MD  furosemide (LASIX) 40 MG tablet Take 0.5 tablets (20 mg total) daily by mouth. Patient taking differently: Take 40 mg by mouth daily.  04/25/17  Yes Bonnielee Haff, MD  guaiFENesin-dextromethorphan [facility] DM) 100-10 MG/5ML syrup Take 10 mLs by mouth every 4 (four) hours as needed for cough. 11/24/17  Yes Jani Gravel, MD  HYDROcodone-acetaminophen (NORCO/VICODIN) 5-325 MG tablet Take 1-2 tablets every 4 (four) hours as needed by mouth for moderate pain. Patient taking differently: Take 1 tablet by mouth every 4 (four) hours as needed for moderate pain.  04/25/17  Yes Bonnielee Haff, MD  ipratropium-albuterol (DUONEB) 0.5-2.5 (3) MG/3ML SOLN Take 3 mLs by nebulization every 6 (six) hours as needed. 03/21/18  Yes Dhungel, Nishant, MD  metoprolol succinate (TOPROL-XL) 25 MG 24 hr tablet Take 25 mg by mouth daily.     Yes [provider]  midodrine (PROAMATINE) 2.5 MG tablet Take 2.5 mg by mouth as needed (when systolic BP is lower than 100).    Yes [provider]  mirtazapine (REMERON) 15 MG tablet Take 7.5 mg by mouth at bedtime.    Yes [provider]  ondansetron (ZOFRAN) 4 MG tablet Take 4 mg by mouth every 6 (six) hours as needed for nausea or vomiting.   Yes [provider]  OXYGEN Inhale 2 L into the lungs continuous.   Yes [provider]  potassium chloride SA (K-DUR,KLOR-CON) 20 MEQ tablet Take 1 tablet (20 mEq total) daily by mouth. 04/25/17  Yes Bonnielee Haff, MD  predniSONE (DELTASONE) 20 MG tablet Take 40 mg by mouth daily with breakfast. x5 days.   Yes [provider]  Rivaroxaban (XARELTO) 15 MG TABS tablet Take 15 mg by mouth 2 (two) times daily with a meal. Take for 21 days 03/03/18 03/31/18 Yes [provider]  umeclidinium-vilanterol (ANORO ELLIPTA) 62.5-25 MCG/INH AEPB Inhale 1 puff into the lungs daily. 08/16/17  Yes Mannam, Praveen, MD  predniSONE (DELTASONE) 10 MG tablet Take 1 tablet (10 mg total) by mouth daily. Patient taking differently: Take 10 mg by mouth daily. Start on 10.8.19 03/27/18   Dhungel, Flonnie Overman, MD     Montey Hora, PA - [facility] Pager: [phone]  or [phone] 03/26/2018, 1:40 PM

## 2018-03-26 NOTE — H&P (Signed)
History and Physical    [name] NGE:[id number] DOB: 10-Aug-1930 DOA: 03/26/2018  PCP: Lajean Manes, MD Consultants:  [facility] - pulmonology; Palliative Care Patient coming from:  [facility]; NOK: Daughter, [phone], [phone]  Chief Complaint:  SOB  HPI: [name] is a 84 y.o. female with medical history significant of HTN; PMR; afib; pacemaker placement; dermatomyositis; COPD; and CHF presenting with SOB.  The patient called her daughter (the facility had called the last 2 days) - she complained of being "sick" - her daughter thinks she has she gets so much congestion it makes her nauseated.  She is usually on 3L O2 (increased like week by pulm) and sats were no higher 84%.  She was admitted previously last week, seems to tolerate BIPAP ok.  Palliative care called her daughter Friday and were surprised by her decline and recommended a move to Hospice care (within the facility).  They had an appointment this AM with PCP to discuss, but they missed the appointment.  Her daughter requests pulm input prior to making this decision.   She has not been eating or drinking since last hospitalization.  Patient was admitted from 9/28-10/2 with acute on chronic respiratory failure attributed to COPD, viral PNA.  She required BIPAP initially.  She followed up with pulm on 10/3 and her O2 was increased to 3-4L.  The patient has shown significant decline and recommendation was for consideration of transition from palliative care to hospice.   ED Course:  Respiratory failure - palliative care meeting earlier today, possible transition to comfort measures at some point.  Current plan for treatment up to intubation.  Chronic prednisone, on 4L chronic home O2.  70s by EMS from facility this AM.  Hypertensive, started on NTG drip which was just turned off.  Mostly appears to be volume overload, sounds wet.  Has a suspicious RUL area, covered with antibiotics for PNA.  Also given  Lasix.  Troponin, Lactate, BNP reassuring.  Patient is improved on BIPAP.  PCCM notified of admission, but requests hospitalist consultant if support is needed.  Review of Systems: Unable to perform  Ambulatory Status:  Transfers with assistance only (she did walk with walker prior to last hospitalization)  Past Medical History:  Diagnosis Date  . CHB (complete heart block) ([place]) 07/13/2013   Pacemaker dependent  . CHF (congestive heart failure) ([place])   . COPD (chronic obstructive pulmonary disease) ([place])    on 3L home O2  . DM (dermatomyositis)   . Orthostatic hypotension   . Pacemaker 07/13/2013   Her original pacemaker and the current leads were implanted in 1992. She has had 2 generator change out, most recently in 2010. Her device is a Buyer, retail 2110 non RF dual-chamber pacemaker with a battery longevity estimated at about 7 years. The atrial lead is a St. Jude [id number] and the ventricular lead was a Biotronik [id number].   [name] Paroxysmal atrial fibrillation ([place]) 09/04/2014   on Eliquis  . Polymyalgia rheumatica ([place])   . Scoliosis   . SSS (sick sinus syndrome) ([place]) 07/13/2013  . Syncope   . Systemic hypertension   . Thoracic kyphosis     Past Surgical History:  Procedure Laterality Date  . IR KYPHO THORACIC WITH BONE BIOPSY  01/04/2017  . IR RADIOLOGIST EVAL & MGMT  01/10/2017  . NM MYOCAR PERF WALL MOTION  09/13/2011   Low risk  . PERMANENT PACEMAKER GENERATOR CHANGE  03/27/2009   St.Jude  . PPM GENERATOR  CHANGEOUT N/A 08/09/2017   Procedure: PPM GENERATOR CHANGEOUT;  Surgeon: Sanda Klein, MD;  Location: [facility];  Service: Cardiovascular;  Laterality: N/A;  . US ECHOCARDIOGRAPHY  03/28/2012   Mod LAE,mild MR,aortic sclerosis w/mod AI,mod. TR,mild PI,Stage I diastolic dysfunction  . VIDEO BRONCHOSCOPY Bilateral 10/07/2016   Procedure: VIDEO BRONCHOSCOPY WITH FLUORO;  Surgeon: Marshell Garfinkel, MD;  Location: [facility];  Service: Cardiopulmonary;  Laterality:  Bilateral;    Social History   Socioeconomic History  . Marital status: Married    Spouse name: Not on file  . Number of children: Not on file  . Years of education: Not on file  . Highest education level: Not on file  Occupational History  . Occupation: retired  Scientific laboratory technician  . Financial resource strain: Not on file  . Food insecurity:    Worry: Not on file    Inability: Not on file  . Transportation needs:    Medical: Not on file    Non-medical: Not on file  Tobacco Use  . Smoking status: Former Smoker    Packs/day: 0.50    Years: 60.00    Pack years: 30.00    Types: Cigarettes  . Smokeless tobacco: Never Used  . Tobacco comment: quit 08/2017  Substance and Sexual Activity  . Alcohol use: No  . Drug use: No  . Sexual activity: Not Currently    Partners: Female  Lifestyle  . Physical activity:    Days per week: Not on file    Minutes per session: Not on file  . Stress: Not on file  Relationships  . Social connections:    Talks on phone: Not on file    Gets together: Not on file    Attends religious service: Not on file    Active member of club or organization: Not on file    Attends meetings of clubs or organizations: Not on file    Relationship status: Not on file  . Intimate partner violence:    Fear of current or ex partner: Not on file    Emotionally abused: Not on file    Physically abused: Not on file    Forced sexual activity: Not on file  Other Topics Concern  . Not on file  Social History Narrative  . Not on file    No Known Allergies  Family History  Problem Relation Age of Onset  . Stroke Mother     Prior to Admission medications   Medication Sig Start Date End Date Taking? Authorizing Provider  acetaminophen (TYLENOL) 325 MG tablet Take 325 mg by mouth every 6 (six) hours as needed for mild pain, moderate pain or fever.    Yes [provider]  albuterol (PROVENTIL) (2.5 MG/3ML) 0.083% nebulizer solution Take 3 mLs (2.5 mg total)  by nebulization 3 (three) times daily. 03/23/18 03/23/19 Yes Martyn Ehrich, NP  alendronate (FOSAMAX) 70 MG tablet Take 70 mg by mouth every Wednesday. Take with a full glass of water on an empty stomach.    Yes [provider]  ALPRAZolam (XANAX) 0.25 MG tablet Take 0.25 mg by mouth at bedtime.   Yes [provider]  escitalopram (LEXAPRO) 10 MG tablet Take 10 mg by mouth daily.   Yes [provider]  feeding supplement, ENSURE ENLIVE, (ENSURE ENLIVE) LIQD Take 237 mLs by mouth daily. 03/21/18  Yes Dhungel, Nishant, MD  furosemide (LASIX) 40 MG tablet Take 0.5 tablets (20 mg total) daily by mouth. Patient taking differently: Take  40 mg by mouth daily.  04/25/17  Yes Bonnielee Haff, MD  guaiFENesin-dextromethorphan [facility] DM) 100-10 MG/5ML syrup Take 10 mLs by mouth every 4 (four) hours as needed for cough. 11/24/17  Yes Jani Gravel, MD  HYDROcodone-acetaminophen (NORCO/VICODIN) 5-325 MG tablet Take 1-2 tablets every 4 (four) hours as needed by mouth for moderate pain. Patient taking differently: Take 1 tablet by mouth every 4 (four) hours as needed for moderate pain.  04/25/17  Yes Bonnielee Haff, MD  ipratropium-albuterol (DUONEB) 0.5-2.5 (3) MG/3ML SOLN Take 3 mLs by nebulization every 6 (six) hours as needed. 03/21/18  Yes Dhungel, Nishant, MD  metoprolol succinate (TOPROL-XL) 25 MG 24 hr tablet Take 25 mg by mouth daily.    Yes [provider]  midodrine (PROAMATINE) 2.5 MG tablet Take 2.5 mg by mouth as needed (when systolic BP is lower than 100).    Yes [provider]  mirtazapine (REMERON) 15 MG tablet Take 7.5 mg by mouth at bedtime.    Yes [provider]  ondansetron (ZOFRAN) 4 MG tablet Take 4 mg by mouth every 6 (six) hours as needed for nausea or vomiting.   Yes [provider]  OXYGEN Inhale 2 L into the lungs continuous.   Yes [provider]  potassium chloride SA (K-DUR,KLOR-CON) 20 MEQ tablet Take 1 tablet  (20 mEq total) daily by mouth. 04/25/17  Yes Bonnielee Haff, MD  predniSONE (DELTASONE) 20 MG tablet Take 40 mg by mouth daily with breakfast. x5 days.   Yes [provider]  Rivaroxaban (XARELTO) 15 MG TABS tablet Take 15 mg by mouth 2 (two) times daily with a meal. Take for 21 days 03/03/18 03/31/18 Yes [provider]  umeclidinium-vilanterol (ANORO ELLIPTA) 62.5-25 MCG/INH AEPB Inhale 1 puff into the lungs daily. 08/16/17  Yes Mannam, Praveen, MD  predniSONE (DELTASONE) 10 MG tablet Take 1 tablet (10 mg total) by mouth daily. Patient taking differently: Take 10 mg by mouth daily. Start on 10.8.19 03/27/18   Louellen Molder, MD    Physical Exam: Vitals:   03/26/18 1135 03/26/18 1227 03/26/18 1548 03/26/18 1633  BP: 119/63   (!) 152/77  Pulse: 78 81 73 76  Resp: (!) 29 (!) 35 17 (!) 24  Temp:    98.4 F (36.9 C)  TempSrc:    Axillary  SpO2: 99% 98% 99% 98%  Weight:    62.3 kg  Height:    '5\' 6"'  (1.676 m)     General: Appears calm and comfortable and is NAD, on BIPAP.  She does appear to be quite chronically ill. Eyes:  PERRL, EOMI, normal lids, iris ENT:  grossly normal hearing, lips & tongue, mmm Neck:  no LAD, masses or thyromegaly Cardiovascular:  RRR, no m/r/g. No LE edema.  Respiratory:   Diffusely ronchorous breath sounds.  Increased respiratory effort despite BIPAP. Abdomen:  soft, NT, ND, NABS Skin: scattered skin tears and excoriations Musculoskeletal: decreased tone BUE/BLE, good poassive ROM, no bony abnormality Psychiatric: blunted mood and affect, speech sparse Neurologic: unable to perform    Radiological Exams on Admission: Dg Chest Port 1 View  Result Date: 03/26/2018 CLINICAL DATA:  84 year old female with respiratory distress, hypoxia, rales EXAM: PORTABLE CHEST 1 VIEW COMPARISON:  Prior chest x-ray 03/20/2018 FINDINGS: Right subclavian approach cardiac rhythm maintenance device. Leads project over the right atrium and right ventricle. Stable  cardiomegaly. Atherosclerotic calcifications again noted in the transverse aorta. Inspiratory volumes are lower today. There is ill-defined airspace opacity in the right upper  lung. Slightly increased pulmonary vascular congestion with mild interstitial edema. Probable small bilateral layering pleural effusions. No acute osseous abnormality. IMPRESSION: 1. New ill-defined airspace opacity in the right upper lobe concerning for bronchopneumonia. 2. Cardiomegaly with mild pulmonary edema likely consistent with mild CHF. 3. Small bilateral pleural effusions. 4.  Aortic Atherosclerosis (ICD10-170.0) Electronically Signed   By: Jacqulynn Cadet M.D.   On: 03/26/2018 09:00    EKG: Independently reviewed.  AV disocciation with rate 73; IVCD; LVH; nonspecific ST changes with no evidence of acute ischemia   Labs on Admission: I have personally reviewed the available labs and imaging studies at the time of the admission.  Pertinent labs:   VBG: 7.387/65.4 Glucose 144 Albumin 2.8 Bili 1.3 BNP 836.3 Troponin 0.01 Lactate 1.76 WBC 21.5   Assessment/Plan Principal Problem:   Acute on chronic respiratory failure with hypoxia (HCC) Active Problems:   Paroxysmal atrial fibrillation (HCC)   Anticoagulant long-term use   Acute on chronic diastolic CHF (congestive heart failure), NYHA class 1 (HCC)   HCAP (healthcare-associated pneumonia)   Benign essential HTN   Polymyalgia rheumatica (HCC)   Goals of care, counseling/discussion   Acute on chronic respiratory failure with hypoxia -Multiple recent hospitalizations with continued functional decline by patient -Presenting today with increased O2 requirement, recently increased to 4L, but O2 sats in 70s by EMS -Appears to be multifactorial with possible PNA (HCAP/aspiration), CHF exacerbation, COPD -Based on prior recent pulm conversation and need for repeat admission, daughter is seriously considering transitioning to hospice/comfort care -However,  in order to make this transition, she requested pulmonology consult as well as hospice consult; both have been called -Will admit to SDU for now  HCAP -HCAP vs. Aspiration PNA -RUL infiltrate, and she has h/o prior aspiration -She is on BIPAP at this time and so NPO regardless, but she will need a ST consult prior to ordering a diet -Zosyn, Vanc for now for empiric coverage -Procalcitonin ordered -Solumedrol ordered -Pulm consult requested  CHF exacerbation -CXR also consistent with pulmonary edema -Elevated BNP -With elevated BNP and abnl CXR, CHF exacerbation seems to be a contributing diagnosis -Prior Echo in 4/18 with EF 45-50% -Will place on telemetry -CHF order set utilized -Was given Lasix 40 mg x 1 in ER and will repeat with 40 mg IV BID -Continue BIPAP for now  Afib on Xarelto -Rate controlled on Toprol XL -Continue Xarelto  HTN -Continue Toprol XL  PMR -Solumedrol for now due to inability to effectively take PO  Goals of care -Ongoing discussions regarding hospice eligibility and goals of care -She has had progressive decline and daughter wants to ensure that her needs are being met -Morphine as needed for pain/SOB   DVT prophylaxis: Xarelto Code Status:  DNR - confirmed with family Family Communication: Daughter and son-in-law present throughout evaluation Disposition Plan:  To be determined Consults called: Pulm; Hospice Admission status: Admit - It is my clinical opinion that admission to INPATIENT is reasonable and necessary because of the expectation that this patient will require hospital care that crosses at least 2 midnights to treat this condition based on the medical complexity of the problems presented.  Given the aforementioned information, the predictability of an adverse outcome is felt to be significant.    Karmen Bongo MD [facility]  If note is complete, please contact covering daytime or nighttime  physician. [email or web address] Password [email or web address]  03/26/2018, 6:30 PM

## 2018-03-26 NOTE — Progress Notes (Addendum)
Pharmacy Antibiotic Note  [name] is a 84 y.o. female admitted on 03/26/2018 with pneumonia. Pharmacy has been consulted for vancomycin dosing. Pt is afebrile but WBC is elevated at 21.5. SCr and lactic acid are WNL.  Plan: Vancomycin 1gm IV x 1 then 500mg  IV Q12H F/u renal fxn, C&S, clinical status and trough at [facility] F/u continuation of cefepime or other gram negative coverage     Addendum Adding zosyn 3.375gm IV Q8H (4 hr inf)  Temp (24hrs), Avg:97.8 F (36.6 C), Min:97.8 F (36.6 C), Max:97.8 F (36.6 C)  Recent Labs  Lab 03/20/18 0310 03/21/18 0304 03/26/18 0818 03/26/18 0825  WBC 18.5* 17.6* 21.5*  --   CREATININE 1.03* 0.91 0.79  --   LATICACIDVEN  --   --   --  1.76    Estimated Creatinine Clearance: 48.1 mL/min (by C-G formula based on SCr of 0.79 mg/dL).    No Known Allergies  Antimicrobials this admission: Vanc 10/7>> Cefepime x 10/7  Dose adjustments this admission: N/A  Microbiology results: Pending  Thank you for allowing pharmacy to be a part of this patient's care.  Zadrian Mccauley, Rande Lawman 03/26/2018 9:17 AM

## 2018-03-26 NOTE — ED Provider Notes (Signed)
[facility] EMERGENCY DEPARTMENT Provider Note   CSN: [id number] Arrival date & time: 03/26/18  0803     History   Chief Complaint Chief Complaint  Patient presents with  . Respiratory Distress    HPI [name] is a 84 y.o. female.  84 year old female with extensive past medical history including atrial fibrillation, polymyalgia rheumatica, chronic respiratory failure on 4 L home oxygen, COPD, CHF who presents with respiratory distress.  Patient was picked up by EMS from her nursing facility today where they were called for respiratory distress.  She is reportedly had progressively worsening shortness of breath over the past few days associated with productive cough. On EMS arrival, she was 79% on 4L O2. Improved to 90s when placed on CPAP. Given NTG x3 and zofran x1 in route.  She is on prednisone daily.  LEVEL 5 CAVEAT DUE TO RESPIRATORY DISTRESS  The history is provided by the EMS personnel.    Past Medical History:  Diagnosis Date  . CHB (complete heart block) ([place]) 07/13/2013   Pacemaker dependent  . CHF (congestive heart failure) ([place])   . COPD (chronic obstructive pulmonary disease) ([place])   . DM (dermatomyositis)   . Orthostatic hypotension   . Pacemaker 07/13/2013   Her original pacemaker and the current leads were implanted in 1992. She has had 2 generator change out, most recently in 2010. Her device is a Buyer, retail 2110 non RF dual-chamber pacemaker with a battery longevity estimated at about 7 years. The atrial lead is a St. Jude [id number] and the ventricular lead was a Biotronik [id number].   [name] Paroxysmal atrial fibrillation ([place]) 09/04/2014   on Eliquis  . Polymyalgia rheumatica ([place])   . Scoliosis   . SSS (sick sinus syndrome) ([place]) 07/13/2013  . Syncope   . Systemic hypertension   . Thoracic kyphosis     Patient Active Problem List   Diagnosis Date Noted  . Acute on chronic respiratory failure ([place]) 03/22/2018  . Weakness generalized  03/21/2018  . Right middle lobe pneumonia ([place]) 11/20/2017  . Elective replacement indicated for cardiac pacemaker battery at end of lifespan 08/09/2017  . Chronic pulmonary aspiration 05/02/2017  . Encounter for palliative care   . Goals of care, counseling/discussion   . Shortness of breath   . Stress fracture of thoracic vertebra 03/19/2017  . Dementia ([place]) 03/19/2017  . Non-traumatic compression fracture of T9 thoracic vertebra 01/02/2017  . Polymyalgia rheumatica ([place])   . Postural dizziness with near syncope   . Syncope 10/11/2016  . Postural dizziness with presyncope 10/11/2016  . Acute hypoxemic respiratory failure ([place]) 10/05/2016  . Chronic diastolic CHF (congestive heart failure) ([place]) 10/05/2016  . Smoker 10/05/2016  . Closed multiple fractures of right upper extremity with ribs with routine healing 10/05/2016  . HCAP (healthcare-associated pneumonia) 10/31/2015  . GOLD COPD II D 10/31/2015  . Leukocytosis 10/31/2015  . Benign essential HTN 10/31/2015  . Anxiety state   . COPD exacerbation ([place]) 08/26/2015  . Systemic hypertension 08/26/2015  . Hypokalemia 08/26/2015  . Anticoagulant long-term use 08/26/2015  . DM (dermatomyositis) 08/26/2015  . Acute on chronic diastolic CHF (congestive heart failure), NYHA class 1 ([place])   . Sepsis ([place]) 06/07/2015  . CAP (community acquired pneumonia) 06/07/2015  . Acute CHF (congestive heart failure) ([place]) 06/07/2015  . Paroxysmal atrial fibrillation ([place]) 09/04/2014  . Pacemaker 07/13/2013  . CHB (complete heart block) ([place]) 07/13/2013  . SSS (sick sinus syndrome) ([place]) 07/13/2013  . Orthostatic  hypotension 07/13/2013  . Aortic insufficiency 07/13/2013    Past Surgical History:  Procedure Laterality Date  . IR KYPHO THORACIC WITH BONE BIOPSY  01/04/2017  . IR RADIOLOGIST EVAL & MGMT  01/10/2017  . NM MYOCAR PERF WALL MOTION  09/13/2011   Low risk  . PERMANENT PACEMAKER GENERATOR CHANGE  03/27/2009   [employer]  . PPM  GENERATOR CHANGEOUT N/A 08/09/2017   Procedure: PPM GENERATOR CHANGEOUT;  Surgeon: Sanda Klein, MD;  Location: [facility];  Service: Cardiovascular;  Laterality: N/A;  . US ECHOCARDIOGRAPHY  03/28/2012   Mod LAE,mild MR,aortic sclerosis w/mod AI,mod. TR,mild PI,Stage I diastolic dysfunction  . VIDEO BRONCHOSCOPY Bilateral 10/07/2016   Procedure: VIDEO BRONCHOSCOPY WITH FLUORO;  Surgeon: Marshell Garfinkel, MD;  Location: [facility];  Service: Cardiopulmonary;  Laterality: Bilateral;     OB History   None      Home Medications    Prior to Admission medications   Medication Sig Start Date End Date Taking? Authorizing Provider  acetaminophen (TYLENOL) 325 MG tablet Take 325 mg by mouth every 6 (six) hours as needed for mild pain, moderate pain or fever.     [provider]  albuterol (PROVENTIL) (2.5 MG/3ML) 0.083% nebulizer solution Take 3 mLs (2.5 mg total) by nebulization 3 (three) times daily. 03/23/18 03/23/19  Martyn Ehrich, NP  alendronate (FOSAMAX) 70 MG tablet Take 70 mg by mouth every Wednesday. Take with a full glass of water on an empty stomach.     [provider]  escitalopram (LEXAPRO) 10 MG tablet Take 10 mg by mouth daily.    [provider]  feeding supplement, ENSURE ENLIVE, (ENSURE ENLIVE) LIQD Take 237 mLs by mouth daily. Patient not taking: Reported on 03/22/2018 03/21/18   Dhungel, Flonnie Overman, MD  furosemide (LASIX) 40 MG tablet Take 0.5 tablets (20 mg total) daily by mouth. Patient taking differently: Take 20 mg by mouth daily as needed (for over 2lbs weight increase).  04/25/17   Bonnielee Haff, MD  guaiFENesin-dextromethorphan [facility] DM) 100-10 MG/5ML syrup Take 10 mLs by mouth every 4 (four) hours as needed for cough. 11/24/17   Jani Gravel, MD  HYDROcodone-acetaminophen (NORCO/VICODIN) 5-325 MG tablet Take 1-2 tablets every 4 (four) hours as needed by mouth for moderate pain. Patient taking differently: Take 1 tablet by mouth  every 4 (four) hours as needed for moderate pain.  04/25/17   Bonnielee Haff, MD  ipratropium-albuterol (DUONEB) 0.5-2.5 (3) MG/3ML SOLN Take 3 mLs by nebulization every 6 (six) hours as needed. 03/21/18   Dhungel, Flonnie Overman, MD  metoprolol succinate (TOPROL-XL) 25 MG 24 hr tablet Take 25 mg by mouth daily.     [provider]  midodrine (PROAMATINE) 2.5 MG tablet Take 2.5 mg by mouth as needed (when systolic BP is lower than 100).     [provider]  mirtazapine (REMERON) 15 MG tablet Take 7.5 mg by mouth at bedtime.     [provider]  ondansetron (ZOFRAN) 4 MG tablet Take 4 mg by mouth every 6 (six) hours as needed for nausea or vomiting.    [provider]  OXYGEN Inhale 2 L into the lungs continuous.    [provider]  potassium chloride SA (K-DUR,KLOR-CON) 20 MEQ tablet Take 1 tablet (20 mEq total) daily by mouth. Patient taking differently: Take 20 mEq by mouth daily as needed (with Lasix).  04/25/17   Bonnielee Haff, MD  predniSONE (DELTASONE) 10 MG tablet Take 1 tablet (10 mg total) by mouth  daily. 03/27/18   Dhungel, Flonnie Overman, MD  Rivaroxaban (XARELTO) 15 MG TABS tablet Take 15 mg by mouth 2 (two) times daily with a meal. Take for 21 days 03/03/18 03/31/18  [provider]  umeclidinium-vilanterol (ANORO ELLIPTA) 62.5-25 MCG/INH AEPB Inhale 1 puff into the lungs daily. 08/16/17   Marshell Garfinkel, MD    Family History Family History  Problem Relation Age of Onset  . Stroke Mother     Social History Social History   Tobacco Use  . Smoking status: Former Smoker    Packs/day: 0.50    Years: 60.00    Pack years: 30.00    Types: Cigarettes  . Smokeless tobacco: Never Used  . Tobacco comment: quit 12/2016  Substance Use Topics  . Alcohol use: No  . Drug use: No     Allergies   Patient has no known allergies.   Review of Systems Review of Systems  Unable to perform ROS: Severe respiratory distress     Physical  Exam Updated Vital Signs BP (!) 162/82 (BP Location: Right Arm)   Pulse 74   Temp 97.8 F (36.6 C) (Axillary)   Resp (!) 24   SpO2 99%   Physical Exam  Constitutional: She appears well-developed. She appears distressed.  Thin, frail, elderly woman in respiratory distress on CPAP  HENT:  Head: Normocephalic and atraumatic.  Moist mucous membranes  Eyes: Conjunctivae are normal.  Neck: Neck supple.  Cardiovascular: Normal rate, regular rhythm and normal heart sounds.  Pulmonary/Chest: She is in respiratory distress. She has rales.  Increased WOB with tachypnea and accessory muscle use, diffuse rales b/l  Abdominal: Soft. Bowel sounds are normal. She exhibits no distension. There is no tenderness.  Musculoskeletal: She exhibits no edema.  Neurological: She is alert.  Fluent speech  Skin: Skin is warm and dry.  Scattered bruises and wounds on arms and legs b/l  Psychiatric:  anxious  Nursing note and vitals reviewed.    ED Treatments / Results  Labs (all labs ordered are listed, but only abnormal results are displayed) Labs Reviewed  COMPREHENSIVE METABOLIC PANEL - Abnormal; Notable for the following components:      Result Value   Chloride 97 (*)    Glucose, Bld 144 (*)    Calcium 8.8 (*)    Albumin 2.8 (*)    Total Bilirubin 1.3 (*)    All other components within normal limits  CBC WITH DIFFERENTIAL/PLATELET - Abnormal; Notable for the following components:   WBC 21.5 (*)    MCHC 29.4 (*)    RDW 15.7 (*)    Platelets 461 (*)    Neutro Abs 17.1 (*)    Monocytes Absolute 1.5 (*)    Abs Immature Granulocytes 0.4 (*)    All other components within normal limits  BRAIN NATRIURETIC PEPTIDE - Abnormal; Notable for the following components:   B Natriuretic Peptide 836.3 (*)    All other components within normal limits  I-STAT VENOUS BLOOD GAS, ED - Abnormal; Notable for the following components:   pCO2, Ven 65.4 (*)    Bicarbonate 39.3 (*)    TCO2 41 (*)    Acid-Base  Excess 11.0 (*)    All other components within normal limits  I-STAT TROPONIN, ED  I-STAT CG4 LACTIC ACID, ED  I-STAT CG4 LACTIC ACID, ED    EKG None  Radiology No results found.  Procedures .Critical Care Performed by: Sharlett Iles, MD Authorized by: Sharlett Iles, MD   Critical care  provider statement:    Critical care time (minutes):  40   Critical care time was exclusive of:  Separately billable procedures and treating other patients   Critical care was necessary to treat or prevent imminent or life-threatening deterioration of the following conditions:  Respiratory failure   Critical care was time spent personally by me on the following activities:  Development of treatment plan with patient or surrogate, discussions with consultants, evaluation of patient's response to treatment, obtaining history from patient or surrogate, ordering and performing treatments and interventions, ordering and review of laboratory studies, ordering and review of radiographic studies, re-evaluation of patient's condition and review of old charts   (including critical care time)  Medications Ordered in ED Medications  ondansetron (ZOFRAN) injection 4 mg (0 mg Intravenous Hold 03/26/18 0815)  ceFEPIme (MAXIPIME) 2 g in sodium chloride 0.9 % 100 mL IVPB (has no administration in time range)  vancomycin (VANCOCIN) 500 mg in sodium chloride 0.9 % 100 mL IVPB (has no administration in time range)  furosemide (LASIX) injection 40 mg (has no administration in time range)  nitroGLYCERIN 50 mg in dextrose 5 % 250 mL (0.2 mg/mL) infusion (5 mcg/min Intravenous New Bag/Given 03/26/18 0825)  LORazepam (ATIVAN) injection 0.5 mg (0.5 mg Intravenous Given 03/26/18 0915)  vancomycin (VANCOCIN) IVPB 1000 mg/200 mL premix (1,000 mg Intravenous New Bag/Given 03/26/18 1003)     Initial Impression / Assessment and Plan / ED Course  I have reviewed the triage vital signs and the nursing  notes.  Pertinent labs & imaging results that were available during my care of the patient were reviewed by me and considered in my medical decision making (see chart for details).     PT in respiratory distress on arrival, placed on BiPAP with good oxygenation.  Afebrile.  Initial BP 162/82 which is after several doses of nitroglycerin from EMS.  Diffuse rales bilaterally.  VBG reassuring, 7.39 CO2 65 which is likely close to baseline given medical hx. given Ativan to help tolerate BiPAP mask.  Lab work shows negative initial lactate and troponin, reassuring CMP, BNP 836. CXR shows RUL opacity and pulmonary edema. Gave HCAP coverage with Vanc and cefepime as well as dose of IV lasix.  I had a long discussion with daughter regarding the patient's goals of care.  She is DNR/DNI and they have been having discussions with palliative care.  She had an appointment scheduled to meet with hospice this afternoon.  I discussed with daughter the concept of comfort care versus aggressive therapy and she will continue to have discussions with the patient and other family members to clarify which direction patient wants to go.  Touched base with pulmonology team to notify them of the patient's admission given that she was just seen by them in the clinic few days ago.  Discussed admission with [facility] hospitalist, Dr. Lorin Mercy, appreciate assistance.  Patient admitted for further care.  Final Clinical Impressions(s) / ED Diagnoses   Final diagnoses:  HCAP (healthcare-associated pneumonia)  Acute respiratory failure with hypoxia and hypercapnia ([place])  Acute on chronic congestive heart failure, unspecified heart failure type [facility])    ED Discharge Orders    None       Little, Wenda Overland, MD 03/26/18 1120

## 2018-03-26 NOTE — Progress Notes (Signed)
[facility]  Received request from ED MD to see family for possible interest in hospice services at [facility] after discharge. Patient is currently active with [facility] service. Chart reviewed and met with patient's daughter [name] and her spouse Mikki Santee in waiting room. Discussed at length hospice services. [name] is working hard to support her mother through decision making and obtain services to manage symptoms at ALF. [name]  agreeable for eligibility review. Will follow up with [name] in am. Spoke with [facility] Debra prior to this visit. Will update Debra in am.   Thank you,  Erling Conte, [place]

## 2018-03-26 NOTE — Progress Notes (Signed)
Pt transported on Bipap from ED to [id number] without complications.

## 2018-03-26 NOTE — ED Notes (Signed)
VBG results reported to Dr.Little. ED-Lab 

## 2018-03-26 NOTE — Progress Notes (Signed)
PALLIATIVE CARE CONSULT VISIT   PATIENT NAME: [name] DOB: October 04, 1930 MRN: [id number]  PRIMARY CARE PROVIDER:   Lajean Manes, MD  REFERRING PROVIDER:  Lajean Manes, MD [address] [place], [place] [address]  RESPONSIBLE PARTY:   Jill Side [phone]   Interim History: Patient hospitalized 9/28-10/2 Tx'd for the following: acute on chronic Respiratory failure 2. Acute on chronic combined systolic and diastolic CHF.  -Patient seen today for f/u post-hospitalization; Patient noted with increase work of breathing and generalized weakness; now requiring 3l [place] oxygen; staff/dtr report poor appetite since hospitalization.  ASSESSMENT/RECOMMENDATIONS and PLAN:  Chronic respiratory failure COPD; chronic O2 dependent (3l/[place]);   -Overall decline of respiratory status: AEB increased O2 requirements and increased WOB and shortness of breath  -Recent LLL (tx at facility) 03/06/18  -Chronic combined systolic and diastolic CHF NYHA Class I (EF 45%)   -Patient with overall worsening of  Chronic respiratory status: increased o2 req, WOB,SOB -continue duo-nebs, Anoro Ellipta INH  -generalized weakness;deconditiong since hosp: OT  -continue oral lasix -consider ACEI or ARB  -Patient has f/u appt with PCP 10/7: will discuss possible  Hospice   Polymyalgia theumatica -on chronic low dose prednisone  -Chronic Atrial Fibrillation -Hypotension with h/o HTN -CHB s/p pacemaker -RightLower ext DVT -metoprolol 25 mg 24hr -Continue Xarelto  -Dementia -depression/anxiety -decreased oral intake (weight stable) -continue lexapro, anxiety  ACP -DNR; wants to avoid hospitalization -If PCP agrees may transition to hospice    I spent 60 minutes providing this consultation,  from 09:30 to 10:30. More than 50% of the time in this consultation was spent coordinating communication.   HISTORY OF PRESENT ILLNESS: Debbie Kegg Winslowis a 84 y.o.year oldfemalewith  multiple medical problems including O2 dependent COPD; recent Acute hypoxic respiratory failure/LLL PNA, RLE DVT, CHF, Chronic atrial fibrillation, CHB s/p pacemaker,Palliative Care was asked to continue to follow patient to assist with coordination of care, symptoms management and ongoing support   CODE STATUS: DNR  PPS: 40% HOSPICE ELIGIBILITY/DIAGNOSIS: TBD  PAST MEDICAL HISTORY:  Past Medical History:  Diagnosis Date  . CHB (complete heart block) ([place]) 07/13/2013   Pacemaker dependent  . CHF (congestive heart failure) ([place])   . COPD (chronic obstructive pulmonary disease) ([place])   . DM (dermatomyositis)   . Orthostatic hypotension   . Pacemaker 07/13/2013   Her original pacemaker and the current leads were implanted in 1992. She has had 2 generator change out, most recently in 2010. Her device is a Buyer, retail 2110 non RF dual-chamber pacemaker with a battery longevity estimated at about 7 years. The atrial lead is a St. Jude [id number] and the ventricular lead was a Biotronik [id number].   [name] Paroxysmal atrial fibrillation ([place]) 09/04/2014   on Eliquis  . Polymyalgia rheumatica ([place])   . Scoliosis   . SSS (sick sinus syndrome) ([place]) 07/13/2013  . Syncope   . Systemic hypertension   . Thoracic kyphosis     SOCIAL HX:  Social History   Tobacco Use  . Smoking status: Former Smoker    Packs/day: 0.50    Years: 60.00    Pack years: 30.00    Types: Cigarettes  . Smokeless tobacco: Never Used  . Tobacco comment: quit 12/2016  Substance Use Topics  . Alcohol use: No    ALLERGIES: No Known Allergies   PERTINENT MEDICATIONS:  No facility-administered encounter medications on file as of 03/22/2018.    Outpatient Encounter Medications as of 03/22/2018  Medication  Sig  . acetaminophen (TYLENOL) 325 MG tablet Take 325 mg by mouth every 6 (six) hours as needed for mild pain, moderate pain or fever.   [name] alendronate (FOSAMAX) 70 MG tablet Take 70 mg by mouth every Wednesday. Take with a full  glass of water on an empty stomach.   . escitalopram (LEXAPRO) 10 MG tablet Take 10 mg by mouth daily.  . feeding supplement, ENSURE ENLIVE, (ENSURE ENLIVE) LIQD Take 237 mLs by mouth daily. (Patient not taking: Reported on 03/22/2018)  . furosemide (LASIX) 40 MG tablet Take 0.5 tablets (20 mg total) daily by mouth. (Patient taking differently: Take 20 mg by mouth daily as needed (for over 2lbs weight increase). )  . guaiFENesin-dextromethorphan (ROBITUSSIN DM) 100-10 MG/5ML syrup Take 10 mLs by mouth every 4 (four) hours as needed for cough.  [name] HYDROcodone-acetaminophen (NORCO/VICODIN) 5-325 MG tablet Take 1-2 tablets every 4 (four) hours as needed by mouth for moderate pain. (Patient taking differently: Take 1 tablet by mouth every 4 (four) hours as needed for moderate pain. )  . ipratropium-albuterol (DUONEB) 0.5-2.5 (3) MG/3ML SOLN Take 3 mLs by nebulization every 6 (six) hours as needed.  . metoprolol succinate (TOPROL-XL) 25 MG 24 hr tablet Take 25 mg by mouth daily.   . midodrine (PROAMATINE) 2.5 MG tablet Take 2.5 mg by mouth as needed (when systolic BP is lower than 100).   . mirtazapine (REMERON) 15 MG tablet Take 7.5 mg by mouth at bedtime.   . ondansetron (ZOFRAN) 4 MG tablet Take 4 mg by mouth every 6 (six) hours as needed for nausea or vomiting.  . OXYGEN Inhale 2 L into the lungs continuous.  . potassium chloride SA (K-DUR,KLOR-CON) 20 MEQ tablet Take 1 tablet (20 mEq total) daily by mouth. (Patient taking differently: Take 20 mEq by mouth daily as needed (with Lasix). )  . [START ON 03/27/2018] predniSONE (DELTASONE) 10 MG tablet Take 1 tablet (10 mg total) by mouth daily.  . Rivaroxaban (XARELTO) 15 MG TABS tablet Take 15 mg by mouth 2 (two) times daily with a meal. Take for 21 days  . umeclidinium-vilanterol (ANORO ELLIPTA) 62.5-25 MCG/INH AEPB Inhale 1 puff into the lungs daily.  . [DISCONTINUED] furosemide (LASIX) 40 MG tablet Take 40 mg by mouth daily.  . [DISCONTINUED] predniSONE  (DELTASONE) 20 MG tablet Take 2 tablets (40 mg total) by mouth daily with breakfast for 5 days.    PHYSICAL EXAM:   General: chronically ill-appearing, frail appearing, thin Cardiovascular: regular rate and rhythm Pulmonary: respirations shallow,even; tachypenic, Bilateral lung fields diminished but clear Abdomen: soft, nontender, + bowel sounds GU: no suprapubic tenderness Extremities: 1+ BLE edema, no joint deformities Skin: no rashes; skin thin with bruising of forearms and BLE Neurological: Generalized Weakness but otherwise nonfocal  Stephanie G Martinique, NP

## 2018-03-26 NOTE — ED Triage Notes (Signed)
[facility]- pt coming from [facility]. EMS called out for resp distress, initially 79% on cannula (4L), pt takes prednisone daily, hx of CHF.  156/106 HR 80- ventricular pacemaker 96% CPAP

## 2018-03-27 DIAGNOSIS — I509 Heart failure, unspecified: Secondary | ICD-10-CM

## 2018-03-27 DIAGNOSIS — R0609 Other forms of dyspnea: Secondary | ICD-10-CM

## 2018-03-27 DIAGNOSIS — Z66 Do not resuscitate: Secondary | ICD-10-CM

## 2018-03-27 DIAGNOSIS — Z7901 Long term (current) use of anticoagulants: Secondary | ICD-10-CM

## 2018-03-27 DIAGNOSIS — J9601 Acute respiratory failure with hypoxia: Secondary | ICD-10-CM

## 2018-03-27 DIAGNOSIS — Z515 Encounter for palliative care: Secondary | ICD-10-CM

## 2018-03-27 DIAGNOSIS — J9602 Acute respiratory failure with hypercapnia: Secondary | ICD-10-CM

## 2018-03-27 LAB — BASIC METABOLIC PANEL
Anion gap: 11 (ref 5–15)
BUN: 19 mg/dL (ref 8–23)
CHLORIDE: 92 mmol/L — AB (ref 98–111)
CO2: 36 mmol/L — AB (ref 22–32)
Calcium: 8.8 mg/dL — ABNORMAL LOW (ref 8.9–10.3)
Creatinine, Ser: 0.8 mg/dL (ref 0.44–1.00)
GFR calc Af Amer: >60 mL/min (ref >60)
GFR calc non Af Amer: >60 mL/min (ref >60)
Glucose, Bld: 173 mg/dL — ABNORMAL HIGH (ref 70–99)
POTASSIUM: 3.9 mmol/L (ref 3.5–5.1)
SODIUM: 139 mmol/L (ref 135–145)

## 2018-03-27 LAB — CBC WITH DIFFERENTIAL/PLATELET
Abs Immature Granulocytes: 0.2 10*3/uL — ABNORMAL HIGH (ref 0.00–0.07)
BASOS PCT: 0 %
Basophils Absolute: 0 10*3/uL (ref 0.0–0.1)
EOS ABS: 0 10*3/uL (ref 0.0–0.5)
Eosinophils Relative: 0 %
HEMATOCRIT: 38 % (ref 36.0–46.0)
Hemoglobin: 11.3 g/dL — ABNORMAL LOW (ref 12.0–15.0)
Immature Granulocytes: 1 %
LYMPHS ABS: 0.5 10*3/uL — AB (ref 0.7–4.0)
Lymphocytes Relative: 2 %
MCH: 27.3 pg (ref 26.0–34.0)
MCHC: 29.7 g/dL — ABNORMAL LOW (ref 30.0–36.0)
MCV: 91.8 fL (ref 80.0–100.0)
MONO ABS: 0.4 10*3/uL (ref 0.1–1.0)
MONOS PCT: 2 %
NEUTROS PCT: 95 %
Neutro Abs: 18.1 10*3/uL — ABNORMAL HIGH (ref 1.7–7.7)
Platelets: 383 10*3/uL (ref 150–400)
RBC: 4.14 MIL/uL (ref 3.87–5.11)
RDW: 15.4 % (ref 11.5–15.5)
WBC: 19.3 10*3/uL — ABNORMAL HIGH (ref 4.0–10.5)

## 2018-03-27 LAB — STREP PNEUMONIAE URINARY ANTIGEN: Strep Pneumo Urinary Antigen: NEGATIVE

## 2018-03-27 MED ORDER — RIVAROXABAN 15 MG PO TABS
15.0000 mg | ORAL_TABLET | Freq: Two times a day (BID) | ORAL | Status: DC
  Administered 2018-03-27: 15 mg via ORAL
  Filled 2018-03-27 (×2): qty 1

## 2018-03-27 MED ORDER — LEVOFLOXACIN 500 MG PO TABS
750.0000 mg | ORAL_TABLET | ORAL | Status: DC
  Administered 2018-03-27: 750 mg via ORAL
  Filled 2018-03-27 (×2): qty 2

## 2018-03-27 MED ORDER — MORPHINE SULFATE (PF) 2 MG/ML IV SOLN: 1.0000 mg | INTRAVENOUS | Status: DC | PRN

## 2018-03-27 MED ORDER — PREDNISONE 20 MG PO TABS
40.0000 mg | ORAL_TABLET | Freq: Every day | ORAL | Status: DC
  Administered 2018-03-28: 40 mg via ORAL
  Filled 2018-03-27: qty 2

## 2018-03-27 MED ORDER — RIVAROXABAN 20 MG PO TABS: 20.0000 mg | ORAL_TABLET | Freq: Every day | ORAL | Status: DC

## 2018-03-27 MED ORDER — LORAZEPAM 2 MG/ML IJ SOLN: 1.0000 mg | INTRAMUSCULAR | Status: DC | PRN

## 2018-03-27 MED ORDER — IPRATROPIUM-ALBUTEROL 0.5-2.5 (3) MG/3ML IN SOLN
3.0000 mL | Freq: Three times a day (TID) | RESPIRATORY_TRACT | Status: DC
  Administered 2018-03-28 (×2): 3 mL via RESPIRATORY_TRACT
  Filled 2018-03-27 (×2): qty 3

## 2018-03-27 NOTE — Progress Notes (Signed)
NAME:  [name], MRN:  [id number], DOB:  1931/06/09, LOS: 1 ADMISSION DATE:  03/26/2018, CONSULTATION DATE:  03/26/18 REFERRING MD:  Lorin Mercy CHIEF COMPLAINT:  SOB   Brief History   [name] is a 84 y.o. female who is followed as an outpatient by Dr. Vaughan Browner for severe COPD.  Has recently been advised to consider hospice but was not ready.  Now admitted 10/7 with COPD exacerbation and possible HCAP.  Family requesting PCCM input prior to moving / agreeing to palliative / comfort measures.  Significant Hospital Events   10/7 > admit.  Consults: date of consult/date signed off & final recs:  PCCM 10/7 >  Palliative Care 10/8 >   Procedures (surgical and bedside):  None.  Significant Diagnostic Tests:  CXR 10/7 > RUL opacity, cardiomegaly with pulmonary edema, small b/l effusions.  Micro Data: Blood 10/7 >   Antimicrobials:  Vanc 10/7 >  Zosyn 10/7 >    Subjective:  On 2L HFNC while eating breakfast this AM.  Looks and feels improved from yesterday. Required BiPAP overnight due to desaturations.  Awaiting hospice eval.  Objective   Blood pressure (!) 147/71, pulse 74, temperature 98 F (36.7 C), temperature source Axillary, resp. rate 13, height 5\' 6"  (1.676 m), weight 62.3 kg, SpO2 97 %.    FiO2 (%):  [40 %] 40 %   Intake/Output Summary (Last 24 hours) at 03/27/2018 1002 Last data filed at 03/27/2018 0935 Gross per 24 hour  Intake 645.51 ml  Output 2000 ml  Net -1354.49 ml   Filed Weights   03/26/18 1633  Weight: 62.3 kg    Examination: General: Adult female, frail, eating breakfast, in NAD. Neuro: A&O x 3, no deficits. HEENT: [place]/AT. Sclerae anicteric, BiPAP in place. Cardiovascular: RRR, no M/R/G.  Lungs: Respirations unlabored.  Faint basilar crackles. Abdomen: BS x 4, soft, NT/ND.  Musculoskeletal: No gross deformities, no edema.  Skin: Intact, warm, no rashes.  Assessment & Plan:   Gold II D COPD with acute exacerbation - followed by Dr. Vaughan Browner  as an outpatient.  Recently seen in clinic 03/22/18 with recommendations to transition to hospice care. Acute on chronic hypercapnic and hypoxemic respiratory failure - due to above. - Continue supplemental O2 as needed to maintain SpO2 88 - 90%. - Continue BiPAP on PRN basis (for increased WOB or hypercapnia). - Maintain SpO2 > 88%. - Continue steroids (changed to IV), BD's. - Agree with palliative / comfort measures given her recent decline in respiratory status and general well being / quality of life.  Daughter is in agreement for home with hospice. - Continue morphine PRN. - Agree with DNR / DNI status.  Probable HCAP. - Continue empiric abx and follow cultures. - Bronchial hygiene.  Recent DVT - on xarelto. - Continue xarelto.  AoC systolic heart failure - echo From April 2018 with EF 45 - 50%. Acute pulmonary edema. - Continue lasix as BP and SCr permit. - Strict I/O's.  Rest per primary team.  Disposition / Summary of Today's Plan 03/27/18   SDU under [place]. Continue supplemental O2 as needed to maintain SpO2 88 - 90%. BiPAP PRN for hypercapnia or increased WOB - use mental status as gauge for hypercapnia versus serial ABG's.    Diet: Regular. Pain/Anxiety/Delirium protocol (if indicated): Morphine PRN. VAP protocol (if indicated): N/A. DVT prophylaxis: Xarelto. GI prophylaxis: N/A. Hyperglycemia protocol: N/A. Mobility: Bedrest. Code Status: DNR. Family Communication: Daughter and son in law updated at bedside.  Labs  CBC: Recent Labs  Lab 03/21/18 0304 03/26/18 0818 03/27/18 0205  WBC 17.6* 21.5* 19.3*  NEUTROABS  --  17.1* 18.1*  HGB 11.4* 12.0 11.3*  HCT 38.6 40.8 38.0  MCV 93.7 95.8 91.8  PLT 329 461* 768   Basic Metabolic Panel: Recent Labs  Lab 03/21/18 0304 03/26/18 0818 03/27/18 0205  NA 139 135 139  K 4.3 4.0 3.9  CL 98 97* 92*  CO2 35* 31 36*  GLUCOSE 153* 144* 173*  BUN 17 15 19   CREATININE 0.91 0.79 0.80  CALCIUM 8.9 8.8* 8.8*  MG  2.3  --   --    GFR: Estimated Creatinine Clearance: 48.1 mL/min (by C-G formula based on SCr of 0.8 mg/dL). Recent Labs  Lab 03/21/18 0304 03/26/18 0818 03/26/18 0825 03/26/18 1849 03/27/18 0205  PROCALCITON  --   --   --  0.89  --   WBC 17.6* 21.5*  --   --  19.3*  LATICACIDVEN  --   --  1.76  --   --    Liver Function Tests: Recent Labs  Lab 03/26/18 0818  AST 19  ALT 14  ALKPHOS 85  BILITOT 1.3*  PROT 6.8  ALBUMIN 2.8*   No results for input(s): LIPASE, AMYLASE in the last 168 hours. No results for input(s): AMMONIA in the last 168 hours. ABG    Component Value Date/Time   PHART 7.397 06/07/2015 0358   PCO2ART 50.8 (H) 06/07/2015 0358   PO2ART 99.2 06/07/2015 0358   HCO3 39.3 (H) 03/26/2018 0823   TCO2 41 (H) 03/26/2018 0823   O2SAT 64.0 03/26/2018 0823    Coagulation Profile: No results for input(s): INR, PROTIME in the last 168 hours. Cardiac Enzymes: No results for input(s): CKTOTAL, CKMB, CKMBINDEX, TROPONINI in the last 168 hours. HbA1C: No results found for: HGBA1C CBG: Recent Labs  Lab 03/20/18 1110 03/20/18 1708 03/20/18 2113 03/21/18 0727 03/21/18 1132  GLUCAP 129* 98 239* 116* 104*    Admitting History of Present Illness.    [name] is a 84 y.o. female who has a PMH as outlined below including but not limited to GOLD II D COPD (PFT's from 03/24/17 with FVC 2.19 (62%], FEV1 1.61 [54%], F/F 73, TLC 92%, RV/TLC 141%, DLCO 44%), with recent decline in her reserve and quality of life.  She is followed in our clinic by Dr. Vaughan Browner and is on chronic O2 (previously 2 - 3L, recent increased to 4L and prednisone 10mg  daily.  She had recent admission 03/17/18 through 03/21/18 for COPD exacerbation and AoC combined heart failure.  Most recently seen as outpatient on 03/22/18 for hospital follow up.  During that appointment, it was noted that pt had certainly had recent decline in her pulmonary status / reserve along with quality of life over the past 1  year or so.  It was recommended that pt see hospice; however, she was reluctant to do so at that time.  She was instructed to continue with BD's with plans to switch from inhalers to nebs due to concerns that she was not able to effectively use any inhalers.  She also had her baseline O2 increased from 3 to 4L in order to maintain SpO2 > 90%.  Steroids were continued at baseline dose of 10mg  daily (after completion of 40mg  daily following hospital discharge).   Following recent hospitalization, she did see palliative care; however, she was not ready for hospice.  There were plans for her to see PCP and consider  transitioning to hospice on 03/26/18.  Unfortunately on 10/7, she had respiratory distress and worsening hypoxia; therefore, EMS was called and pt was brought to [facility] ED for further evaluation.  She was placed on BiPAP and had improvement in her oxygenation.  She was admitted by [facility] and PCCM was asked to see in consultation given her O2 requirements and per family request prior to considering / transitioning to comfort care.    Montey Hora, [facility] Pager: [phone]  or [phone] 03/27/2018, 10:02 AM

## 2018-03-27 NOTE — Evaluation (Signed)
Clinical/Bedside Swallow Evaluation Patient Details  Name: [name] MRN: [id number] Date of Birth: 06-Dec-1930  Today's Date: 03/27/2018 Time: SLP Start Time (ACUTE ONLY): 26 SLP Stop Time (ACUTE ONLY): 1114 SLP Time Calculation (min) (ACUTE ONLY): 34 min  Past Medical History:  Past Medical History:  Diagnosis Date  . CHB (complete heart block) ([place]) 07/13/2013   Pacemaker dependent  . CHF (congestive heart failure) ([place])   . COPD (chronic obstructive pulmonary disease) ([place])    on 3L home O2  . DM (dermatomyositis)   . Orthostatic hypotension   . Pacemaker 07/13/2013   Her original pacemaker and the current leads were implanted in 1992. She has had 2 generator change out, most recently in 2010. Her device is a Buyer, retail 2110 non RF dual-chamber pacemaker with a battery longevity estimated at about 7 years. The atrial lead is a St. Jude [id number] and the ventricular lead was a Biotronik [id number].   [name] Paroxysmal atrial fibrillation ([place]) 09/04/2014   on Eliquis  . Polymyalgia rheumatica ([place])   . Scoliosis   . SSS (sick sinus syndrome) ([place]) 07/13/2013  . Syncope   . Systemic hypertension   . Thoracic kyphosis    Past Surgical History:  Past Surgical History:  Procedure Laterality Date  . IR KYPHO THORACIC WITH BONE BIOPSY  01/04/2017  . IR RADIOLOGIST EVAL & MGMT  01/10/2017  . NM MYOCAR PERF WALL MOTION  09/13/2011   Low risk  . PERMANENT PACEMAKER GENERATOR CHANGE  03/27/2009   St.Jude  . PPM GENERATOR CHANGEOUT N/A 08/09/2017   Procedure: PPM GENERATOR CHANGEOUT;  Surgeon: Sanda Klein, MD;  Location: [facility];  Service: Cardiovascular;  Laterality: N/A;  . US ECHOCARDIOGRAPHY  03/28/2012   Mod LAE,mild MR,aortic sclerosis w/mod AI,mod. TR,mild PI,Stage I diastolic dysfunction  . VIDEO BRONCHOSCOPY Bilateral 10/07/2016   Procedure: VIDEO BRONCHOSCOPY WITH FLUORO;  Surgeon: Marshell Garfinkel, MD;  Location: [facility];  Service: Cardiopulmonary;  Laterality:  Bilateral;   HPI:  Pt is an 84 yo female admitted with COPD exacerbation and possible PNA, initially requiring BiPAP. Pt/family are considering hospice care. PMH: recurrent PNA, COPD, dermatomyositis, CHF, afib, scoliosis, SSS, thoracic kypohsis. Prior BSEs have appeared normal but MBS (04/2017) revealed consistent mistimed swallow initiation as well as decreased laryngeal vestibule closure resulting in frequent deep penetration before the swallow of thin and nectar thick liquids. Aspiration across meals was suspected. At that time, pt/family preferred to accept the risks of aspiration to focus on QOL, and started regular diet, thin liquids, no straws or mixed consistencies.   Assessment / Plan / Recommendation Clinical Impression  Pt has a h/o dysphagia with airway compromise given thin and nectar thick liquids, but she and her family have previously opted to continue with unrestricted diet to focus on quality. Since MBS in November 2018, pt has continued to have hospitalizations for PNA. She does not recall having had swallow testing done before, and denies any current dysphagia, although intermittent, subtle coughing is noted during thin liquid consumption. Given hx, presentation, and recurrent infection, suspect that aspiration is likely ongoing. Pt did not feel comfortable making a decision without her daughter, therefore SLP also spoke with pt's daughter and son-in-law as they met with their palliative care provider, [name]. They remember swallow testing well, and say that pt does not want to eat/drink if anything is modified from her preference. They acknowledge that the risk for aspiration is ongoing, and at this point in time the continue to  prioritize their mom's comfort and QOL. They would like to continue with regular diet textures and thin liquids, accepting the risk of aspiration and without pursuing additional testing. Will leave current diet order in place with recommendation for frequent oral  care and use of aspiration precautions. All questions were answered at this time - please reorder SLP if we can continue to be of assistance. SLP Visit Diagnosis: Dysphagia, pharyngeal phase (R13.13)    Aspiration Risk  Moderate aspiration risk    Diet Recommendation Regular;Thin liquid(per family request)   Liquid Administration via: Cup Medication Administration: Whole meds with puree Supervision: Patient able to self feed;Intermittent supervision to cue for compensatory strategies Compensations: Slow rate;Small sips/bites Postural Changes: Seated upright at 90 degrees    Other  Recommendations Oral Care Recommendations: Oral care BID   Follow up Recommendations 24 hour supervision/assistance      Frequency and Duration            Prognosis Prognosis for Safe Diet Advancement: Fair Barriers to Reach Goals: Cognitive deficits;Time post onset      Swallow Study   General HPI: Pt is an 84 yo female admitted with COPD exacerbation and possible PNA, initially requiring BiPAP. Pt/family are considering hospice care. PMH: recurrent PNA, COPD, dermatomyositis, CHF, afib, scoliosis, SSS, thoracic kypohsis. Prior BSEs have appeared normal but MBS (04/2017) revealed consistent mistimed swallow initiation as well as decreased laryngeal vestibule closure resulting in frequent deep penetration before the swallow of thin and nectar thick liquids. Aspiration across meals was suspected. At that time, pt/family preferred to accept the risks of aspiration to focus on QOL, and started regular diet, thin liquids, no straws or mixed consistencies. Type of Study: Bedside Swallow Evaluation Previous Swallow Assessment: see HPI Diet Prior to this Study: Regular;Thin liquids Temperature Spikes Noted: No Respiratory Status: Nasal cannula History of Recent Intubation: No Behavior/Cognition: Alert;Cooperative;Pleasant mood;Requires cueing Oral Cavity Assessment: Within Functional Limits Oral Care  Completed by SLP: No Oral Cavity - Dentition: Dentures, top;Dentures, bottom Vision: Functional for self-feeding Self-Feeding Abilities: Able to feed self Patient Positioning: Upright in bed Baseline Vocal Quality: Normal Volitional Cough: Congested Volitional Swallow: Able to elicit    Oral/Motor/Sensory Function Overall Oral Motor/Sensory Function: Within functional limits   Ice Chips Ice chips: Not tested   Thin Liquid Thin Liquid: Impaired Presentation: Cup;Self Fed Pharyngeal  Phase Impairments: Cough - Immediate(subtle)    Nectar Thick Nectar Thick Liquid: Not tested   Honey Thick Honey Thick Liquid: Not tested   Puree Puree: Within functional limits Presentation: Self Fed;Spoon   Solid     Solid: Within functional limits Presentation: Self [place] 03/27/2018,11:51 AM   Germain Osgood, M.A. [place] Acute Environmental education officer [phone] Office [phone]

## 2018-03-27 NOTE — Plan of Care (Signed)

## 2018-03-27 NOTE — Progress Notes (Signed)
PROGRESS NOTE  [name] CXK:[id number] DOB: Nov 07, 1930 DOA: 03/26/2018 PCP: Lajean Manes, MD   LOS: 1 day   Brief Narrative / Interim history: 84 year old female with history of hypertension, PMR, A. fib with pacemaker, dermatomyositis, COPD, combined systolic and diastolic CHF, who is being brought to the hospital with shortness of breath.  On admission chest x-ray showed right upper lobe pneumonia and she was on antibiotics.  It also showed volume overload and patient was started on Lasix.  She was placed on BiPAP in stepdown.  Of note, patient has had a progressive decline and recurrent hospitalizations and there are ongoing palliative care discussions as an outpatient.  Subjective: -On BiPAP this morning, mildly confused but appears to be comfortable.  Denies any shortness of breath.  No chest pain, no discomfort.  Assessment & Plan: Principal Problem:   Acute on chronic respiratory failure with hypoxia (HCC) Active Problems:   Paroxysmal atrial fibrillation (HCC)   Anticoagulant long-term use   Acute on chronic diastolic CHF (congestive heart failure), NYHA class 1 (HCC)   HCAP (healthcare-associated pneumonia)   Benign essential HTN   Polymyalgia rheumatica (HCC)   Goals of care, counseling/discussion   Acute on chronic respiratory failure with hypoxia likely multifactorial due to HCAP and acute on chronic combined CHF -With multiple recent hospitalizations and continued functional decline, admitted to stepdown on BiPAP.  She was started on broad-spectrum antibiotics as well as IV diuretics  Goals of care -Palliative care consulted, discussed with Wadie Lessen today, she had extensive discussion with family and will discontinue IV antibiotics per family wishes, they would prefer a pill, no longer use BiPAP and no further escalation of care, slowly transitioning towards comfort  A. fib -Continue Xarelto, metoprolol  Hypertension -Continue Toprol  PMR -Continue IV  steroids   Scheduled Meds: . ALPRAZolam  0.25 mg Oral QHS  . arformoterol  15 mcg Nebulization BID  . aspirin EC  81 mg Oral Daily  . Chlorhexidine Gluconate Cloth  6 each Topical Q0600  . escitalopram  10 mg Oral Daily  . furosemide  40 mg Intravenous Q12H  . ipratropium-albuterol  3 mL Nebulization Q6H  . levofloxacin  750 mg Oral Q48H  . methylPREDNISolone (SOLU-MEDROL) injection  40 mg Intravenous Daily  . metoprolol succinate  25 mg Oral Daily  . mirtazapine  7.5 mg Oral QHS  . mupirocin ointment  1 application Nasal BID  . Rivaroxaban  15 mg Oral BID WC  . [START ON 04/12/2018] rivaroxaban  20 mg Oral Q supper  . sodium chloride flush  3 mL Intravenous Q12H   Continuous Infusions: . sodium chloride     PRN Meds:.sodium chloride, acetaminophen, albuterol, guaiFENesin-dextromethorphan, LORazepam, morphine injection, ondansetron (ZOFRAN) IV, sodium chloride flush  DVT prophylaxis: Xarelto Code Status: DNR Family Communication: d/w daughter bedside Disposition Plan: TBD  Consultants:   Palliative care  Procedures:   None   Antimicrobials:  Vancomycin / Cefepime 10/7 >> 10/8  Levaquin 10/8 >>  Objective: Vitals:   03/27/18 0734 03/27/18 1023 03/27/18 1025 03/27/18 1156  BP: (!) 147/71   121/63  Pulse: 74   74  Resp: 13   (!) 22  Temp: 98 F (36.7 C)   (!) 97.4 F (36.3 C)  TempSrc: Axillary   Oral  SpO2: 97% 95% 95% 94%  Weight:      Height:        Intake/Output Summary (Last 24 hours) at 03/27/2018 1247 Last data filed at 03/27/2018 1025 Gross per  24 hour  Intake 443 ml  Output 2002 ml  Net -1559 ml   Filed Weights   03/26/18 1633  Weight: 62.3 kg    Examination:  Constitutional: Frail appearing Caucasian female wearing BiPAP Eyes: PERRL, lids and conjunctivae normal ENMT: Mucous membranes are moist. Neck: normal, supple Respiratory: Overall distant breath sounds, no wheezing heard.  Shallow respiratory effort Cardiovascular: Irregular, no  murmurs appreciated.  Trace lower extremity edema Abdomen: no tenderness. Bowel sounds positive.  Skin: no rashes Neurologic: non focal    Data Reviewed: I have independently reviewed following labs and imaging studies   CBC: Recent Labs  Lab 03/21/18 0304 03/26/18 0818 03/27/18 0205  WBC 17.6* 21.5* 19.3*  NEUTROABS  --  17.1* 18.1*  HGB 11.4* 12.0 11.3*  HCT 38.6 40.8 38.0  MCV 93.7 95.8 91.8  PLT 329 461* 388   Basic Metabolic Panel: Recent Labs  Lab 03/21/18 0304 03/26/18 0818 03/27/18 0205  NA 139 135 139  K 4.3 4.0 3.9  CL 98 97* 92*  CO2 35* 31 36*  GLUCOSE 153* 144* 173*  BUN 17 15 19   CREATININE 0.91 0.79 0.80  CALCIUM 8.9 8.8* 8.8*  MG 2.3  --   --    GFR: Estimated Creatinine Clearance: 48.1 mL/min (by C-G formula based on SCr of 0.8 mg/dL). Liver Function Tests: Recent Labs  Lab 03/26/18 0818  AST 19  ALT 14  ALKPHOS 85  BILITOT 1.3*  PROT 6.8  ALBUMIN 2.8*   No results for input(s): LIPASE, AMYLASE in the last 168 hours. No results for input(s): AMMONIA in the last 168 hours. Coagulation Profile: No results for input(s): INR, PROTIME in the last 168 hours. Cardiac Enzymes: No results for input(s): CKTOTAL, CKMB, CKMBINDEX, TROPONINI in the last 168 hours. BNP (last 3 results) No results for input(s): PROBNP in the last 8760 hours. HbA1C: No results for input(s): HGBA1C in the last 72 hours. CBG: Recent Labs  Lab 03/20/18 1708 03/20/18 2113 03/21/18 0727 03/21/18 1132  GLUCAP 98 239* 116* 104*   Lipid Profile: No results for input(s): CHOL, HDL, LDLCALC, TRIG, CHOLHDL, LDLDIRECT in the last 72 hours. Thyroid Function Tests: No results for input(s): TSH, T4TOTAL, FREET4, T3FREE, THYROIDAB in the last 72 hours. Anemia Panel: No results for input(s): VITAMINB12, FOLATE, FERRITIN, TIBC, IRON, RETICCTPCT in the last 72 hours. Urine analysis:    Component Value Date/Time   COLORURINE YELLOW 03/17/2018 [place]  03/17/2018 1252   LABSPEC 1.011 03/17/2018 1252   PHURINE 5.0 03/17/2018 1252   GLUCOSEU NEGATIVE 03/17/2018 1252   HGBUR SMALL (A) 03/17/2018 1252   BILIRUBINUR NEGATIVE 03/17/2018 1252   KETONESUR 5 (A) 03/17/2018 1252   PROTEINUR NEGATIVE 03/17/2018 1252   UROBILINOGEN 1.0 01/19/2011 1030   NITRITE NEGATIVE 03/17/2018 1252   LEUKOCYTESUR NEGATIVE 03/17/2018 1252   Sepsis Labs: Invalid input(s): PROCALCITONIN, LACTICIDVEN  Recent Results (from the past 240 hour(s))  MRSA PCR Screening     Status: Abnormal   Collection Time: 03/26/18 12:42 PM  Result Value Ref Range Status   MRSA by PCR POSITIVE (A) NEGATIVE Final    Comment:        The GeneXpert MRSA Assay (FDA approved for NASAL specimens only), is one component of a comprehensive MRSA colonization surveillance program. It is not intended to diagnose MRSA infection nor to guide or monitor treatment for MRSA infections. RESULT CALLED TO, READ BACK BY AND VERIFIED WITH: Marybelle Killings RN 03/26/18 1551 JDW Performed at [facility], [place] [address] [place], [place] [address]   Culture, blood (routine x 2) Call MD if unable to obtain prior to antibiotics being given     Status: None (Preliminary result)   Collection Time: 03/26/18  1:00 PM  Result Value Ref Range Status   Specimen Description BLOOD LEFT FOREARM  Final   Special Requests   Final    BOTTLES DRAWN AEROBIC AND ANAEROBIC Blood Culture adequate volume   Culture   Final    NO GROWTH < 24 HOURS Performed at [facility], [place] [address] [place], [place] [address]    Report Status PENDING  Incomplete  Culture, blood (routine x 2) Call MD if unable to obtain prior to antibiotics being given     Status: None (Preliminary result)   Collection Time: 03/26/18  1:45 PM  Result Value Ref Range Status   Specimen Description BLOOD LEFT ANTECUBITAL  Final   Special Requests   Final    BOTTLES DRAWN AEROBIC AND ANAEROBIC Blood Culture adequate volume   Culture    Final    NO GROWTH < 24 HOURS Performed at [facility], [address], [place],  [address]    Report Status PENDING  Incomplete      Radiology Studies: Dg Chest Port 1 View  Result Date: 03/26/2018 CLINICAL DATA:  84 year old female with respiratory distress, hypoxia, rales EXAM: PORTABLE CHEST 1 VIEW COMPARISON:  Prior chest x-ray 03/20/2018 FINDINGS: Right subclavian approach cardiac rhythm maintenance device. Leads project over the right atrium and right ventricle. Stable cardiomegaly. Atherosclerotic calcifications again noted in the transverse aorta. Inspiratory volumes are lower today. There is ill-defined airspace opacity in the right upper lung. Slightly increased pulmonary vascular congestion with mild interstitial edema. Probable small bilateral layering pleural effusions. No acute osseous abnormality. IMPRESSION: 1. New ill-defined airspace opacity in the right upper lobe concerning for bronchopneumonia. 2. Cardiomegaly with mild pulmonary edema likely consistent with mild CHF. 3. Small bilateral pleural effusions. 4.  Aortic Atherosclerosis (ICD10-170.0) Electronically Signed   By: Jacqulynn Cadet M.D.   On: 03/26/2018 09:00    Marzetta Board, MD, PhD [facility] Pager [phone]  If 7PM-7AM, please contact night-coverage [email or web address] Password [email or web address] 03/27/2018, 12:47 PM

## 2018-03-27 NOTE — Consult Note (Signed)
Consultation Note Date: 03/27/2018   Patient Name: [name]  DOB: [birth year]  MRN: [id number]  Age / Sex: 84 y.o., female  PCP: Lajean Manes, MD Referring Physician: Caren Griffins, MD  Reason for Consultation: Establishing goals of care and Psychosocial/spiritual support  HPI/Patient Profile: 84 y.o. female   admitted on 03/26/2018 with a past medical history of hypertension, PMR, A. fib with pacemaker, dermatomyositis, COPD, combined systolic and diastolic CHF, who is being brought to the hospital with shortness of breath.  On admission chest x-ray showed right upper lobe pneumonia and  volume overload.     Admitted for treatment and stabilization.  Currently on IV anabiotics and intermittent BiPap.  Daughter reports significant continued physical, functional and cognitive decline over the past six months, multiple re hospitalizations.  Has been seen multiple times by community based palliative care/[facility].  Patient and family face treatment option decisions, advanced directive decisions and anticipatory care needs.  Clinical Assessment and Goals of Care:  This NP Wadie Lessen reviewed medical records, received report from team, assessed the patient and then meet at the patient's bedside along with her daughter/[name] Young and SIL  to discuss diagnosis, prognosis, GOC, EOL wishes disposition and options.  Concept of Hospice and Palliative Care were discussed  A detailed discussion was had today regarding advanced directives.  Concepts specific to code status, artifical feeding and hydration, continued IV antibiotics and rehospitalization was had.  The difference between a aggressive medical intervention path  and a palliative comfort care path for this patient at this time was had.   Values and goals of care important to patient and family were attempted to be elicited.  Daughter understands the  seriousness of the situation, the high risk for decompensation and poor prognosis. Hope is for comfort and dignity.  MOST form introduced and completed to reflect comfort approach, copy in hard chart  Natural trajectory and expectations at EOL were discussed.    Questions and concerns addressed.    Family encouraged to call with questions or concerns.   Plan is to re-meet in the morning at 1000 am  PMT will continue to support holistically.   HCPOA/daughter    SUMMARY OF RECOMMENDATIONS    Code Status/Advance Care Planning:  DNR   No escalation of care, no BiPap  No artificial feeding or hydration now or in the future  No further diagnostics, including lab draws  Minimize medications--family request oral antibiotics  Call daughter with any significant changes/discussed with bedside RN  Symptom Management:   Pain/Dyspnea: Morphine 1-2 mg IV every 1 hr prn  Agitation: Ativan 1 mg IV every 4 hrs prn  Palliative Prophylaxis:   Aspiration, Bowel Regimen, Frequent Pain Assessment and Oral Care  Additional Recommendations (Limitations, Scope, Preferences):  Full Comfort Care  Psycho-social/Spiritual:   Desire for further Chaplaincy support:no  Additional Recommendations: Education on Hospice and Grief/Bereavement Support  Prognosis:   Unable to determine--re-evaluate in the morning   Discharge Planning: To Be Determined      Primary Diagnoses:  Present on Admission: . Acute on chronic respiratory failure with hypoxia ([place]) . Acute on chronic diastolic CHF (congestive heart failure), NYHA class 1 ([place]) . Benign essential HTN . HCAP (healthcare-associated pneumonia) . Paroxysmal atrial fibrillation (H[facility]) . Polymyalgia rheumatica ([place])   I have reviewed the medical record, interviewed the patient and family, and examined the patient. The following aspects are pertinent.  Past Medical History:  Diagnosis Date  . CHB (complete heart block) ([place]) 07/13/2013    Pacemaker dependent  . CHF (congestive heart failure) ([place])   . COPD (chronic obstructive pulmonary disease) ([place])    on 3L home O2  . DM (dermatomyositis)   . Orthostatic hypotension   . Pacemaker 07/13/2013   Her original pacemaker and the current leads were implanted in 1992. She has had 2 generator change out, most recently in 2010. Her device is a Buyer, retail 2110 non RF dual-chamber pacemaker with a battery longevity estimated at about 7 years. The atrial lead is a St. Jude [id number] and the ventricular lead was a Biotronik [id number].   [name] Paroxysmal atrial fibrillation ([place]) 09/04/2014   on Eliquis  . Polymyalgia rheumatica ([place])   . Scoliosis   . SSS (sick sinus syndrome) ([place]) 07/13/2013  . Syncope   . Systemic hypertension   . Thoracic kyphosis    Social History   Socioeconomic History  . Marital status: Married    Spouse name: Not on file  . Number of children: Not on file  . Years of education: Not on file  . Highest education level: Not on file  Occupational History  . Occupation: retired  Scientific laboratory technician  . Financial resource strain: Not on file  . Food insecurity:    Worry: Not on file    Inability: Not on file  . Transportation needs:    Medical: Not on file    Non-medical: Not on file  Tobacco Use  . Smoking status: Former Smoker    Packs/day: 0.50    Years: 60.00    Pack years: 30.00    Types: Cigarettes  . Smokeless tobacco: Never Used  . Tobacco comment: quit 08/2017  Substance and Sexual Activity  . Alcohol use: No  . Drug use: No  . Sexual activity: Not Currently    Partners: Female  Lifestyle  . Physical activity:    Days per week: Not on file    Minutes per session: Not on file  . Stress: Not on file  Relationships  . Social connections:    Talks on phone: Not on file    Gets together: Not on file    Attends religious service: Not on file    Active member of club or organization: Not on file    Attends meetings of clubs or organizations: Not  on file    Relationship status: Not on file  Other Topics Concern  . Not on file  Social History Narrative  . Not on file   Family History  Problem Relation Age of Onset  . Stroke Mother    Scheduled Meds: . ALPRAZolam  0.25 mg Oral QHS  . arformoterol  15 mcg Nebulization BID  . aspirin EC  81 mg Oral Daily  . Chlorhexidine Gluconate Cloth  6 each Topical Q0600  . escitalopram  10 mg Oral Daily  . furosemide  40 mg Intravenous Q12H  . ipratropium-albuterol  3 mL Nebulization Q6H  . levofloxacin  500 mg Oral Daily  . methylPREDNISolone (SOLU-MEDROL) injection  40 mg Intravenous  Daily  . metoprolol succinate  25 mg Oral Daily  . mirtazapine  7.5 mg Oral QHS  . mupirocin ointment  1 application Nasal BID  . Rivaroxaban  15 mg Oral BID WC  . [START ON 04/12/2018] rivaroxaban  20 mg Oral Q supper  . sodium chloride flush  3 mL Intravenous Q12H   Continuous Infusions: . sodium chloride     PRN Meds:.sodium chloride, acetaminophen, albuterol, guaiFENesin-dextromethorphan, LORazepam, morphine injection, ondansetron (ZOFRAN) IV, sodium chloride flush Medications Prior to Admission:  Prior to Admission medications   Medication Sig Start Date End Date Taking? Authorizing Provider  acetaminophen (TYLENOL) 325 MG tablet Take 325 mg by mouth every 6 (six) hours as needed for mild pain, moderate pain or fever.    Yes [provider]  albuterol (PROVENTIL) (2.5 MG/3ML) 0.083% nebulizer solution Take 3 mLs (2.5 mg total) by nebulization 3 (three) times daily. 03/23/18 03/23/19 Yes Martyn Ehrich, NP  alendronate (FOSAMAX) 70 MG tablet Take 70 mg by mouth every Wednesday. Take with a full glass of water on an empty stomach.    Yes [provider]  ALPRAZolam (XANAX) 0.25 MG tablet Take 0.25 mg by mouth at bedtime.   Yes [provider]  escitalopram (LEXAPRO) 10 MG tablet Take 10 mg by mouth daily.   Yes [provider]  feeding supplement, ENSURE ENLIVE,  (ENSURE ENLIVE) LIQD Take 237 mLs by mouth daily. 03/21/18  Yes Dhungel, Nishant, MD  furosemide (LASIX) 40 MG tablet Take 0.5 tablets (20 mg total) daily by mouth. Patient taking differently: Take 40 mg by mouth daily.  04/25/17  Yes Bonnielee Haff, MD  guaiFENesin-dextromethorphan [facility] DM) 100-10 MG/5ML syrup Take 10 mLs by mouth every 4 (four) hours as needed for cough. 11/24/17  Yes Jani Gravel, MD  HYDROcodone-acetaminophen (NORCO/VICODIN) 5-325 MG tablet Take 1-2 tablets every 4 (four) hours as needed by mouth for moderate pain. Patient taking differently: Take 1 tablet by mouth every 4 (four) hours as needed for moderate pain.  04/25/17  Yes Bonnielee Haff, MD  ipratropium-albuterol (DUONEB) 0.5-2.5 (3) MG/3ML SOLN Take 3 mLs by nebulization every 6 (six) hours as needed. 03/21/18  Yes Dhungel, Nishant, MD  metoprolol succinate (TOPROL-XL) 25 MG 24 hr tablet Take 25 mg by mouth daily.    Yes [provider]  midodrine (PROAMATINE) 2.5 MG tablet Take 2.5 mg by mouth as needed (when systolic BP is lower than 100).    Yes [provider]  mirtazapine (REMERON) 15 MG tablet Take 7.5 mg by mouth at bedtime.    Yes [provider]  ondansetron (ZOFRAN) 4 MG tablet Take 4 mg by mouth every 6 (six) hours as needed for nausea or vomiting.   Yes [provider]  OXYGEN Inhale 2 L into the lungs continuous.   Yes [provider]  potassium chloride SA (K-DUR,KLOR-CON) 20 MEQ tablet Take 1 tablet (20 mEq total) daily by mouth. 04/25/17  Yes Bonnielee Haff, MD  predniSONE (DELTASONE) 20 MG tablet Take 40 mg by mouth daily with breakfast. x5 days.   Yes [provider]  Rivaroxaban (XARELTO) 15 MG TABS tablet Take 15 mg by mouth 2 (two) times daily with a meal. Take for 21 days 03/03/18 03/31/18 Yes [provider]  umeclidinium-vilanterol (ANORO ELLIPTA) 62.5-25 MCG/INH AEPB Inhale 1 puff into the lungs daily. 08/16/17  Yes Mannam, Praveen, MD    predniSONE (DELTASONE) 10 MG tablet Take 1 tablet (10 mg total) by mouth daily. Patient taking  differently: Take 10 mg by mouth daily. Start on 10.8.19 03/27/18   Dhungel, Nishant, MD   No Known Allergies Review of Systems  Respiratory: Positive for shortness of breath.   Neurological: Positive for weakness.    Physical Exam  Constitutional: She appears ill.  Cardiovascular: Normal rate, regular rhythm and normal heart sounds.  Pulmonary/Chest: Effort normal and breath sounds normal.  Musculoskeletal:  - generalized weakness, frailty   Neurological: She is alert.  Skin: Skin is warm and dry.    Vital Signs: BP 121/63 (BP Location: Left Leg)   Pulse 74   Temp (!) 97.4 F (36.3 C) (Oral)   Resp (!) 22   Ht 5\' 6"  (1.676 m)   Wt 62.3 kg   SpO2 94%   BMI 22.17 kg/m  Pain Scale: 0-10   Pain Score: 0-No pain   SpO2: SpO2: 94 % O2 Device:SpO2: 94 % O2 Flow Rate: .O2 Flow Rate (L/min): 2 L/min  IO: Intake/output summary:   Intake/Output Summary (Last 24 hours) at 03/27/2018 1208 Last data filed at 03/27/2018 1025 Gross per 24 hour  Intake 443 ml  Output 2002 ml  Net -1559 ml    LBM: Last BM Date: 03/27/18 Baseline Weight: Weight: 62.3 kg Most recent weight: Weight: 62.3 kg     Palliative Assessment/Data:  30 %    Discussed with Dr Cruzita Lederer  Time In: 1030 Time Out: 1200 Time Total: 90 minutes Greater than 50%  of this time was spent counseling and coordinating care related to the above assessment and plan.  Signed by: Wadie Lessen, NP   Please contact Palliative Medicine Team phone at [phone] for questions and concerns.  For individual provider: See Shea Evans

## 2018-03-27 NOTE — Consult Note (Signed)
   [facility] CM Inpatient Consult   03/27/2018  [name] [birth year] [id number]  Patient screened for extreme high risk for unplanned readmissions in Medicare/ACO.  Patient returned from [facility].  Patient also per notes and from inpatient RNCM is active with [employer] program with Hospice and [place].  Currently, patient was been assessed by [facility].  Patient recently per MD history and Physical notes as follows: [name] is a 84 y.o. female with medical history significant of HTN; PMR; afib; pacemaker placement; dermatomyositis; COPD; and CHF presenting with SOB.   She is usually on 3L O2 (increased like week by pulm) and sats were no higher 84%. Inpatient RNCM states patient maybe moving to more comfort measure and active with [facility].  No [facility] Care Management needs noted.    Please place a [facility] Care Management consult or for questions contact:   Natividad Brood, RN BSN [facility] Liaison  [phone] business mobile phone Toll free office [phone]

## 2018-03-28 ENCOUNTER — Other Ambulatory Visit: Payer: Self-pay

## 2018-03-28 MED ORDER — RIVAROXABAN 20 MG PO TABS: 20.0000 mg | ORAL_TABLET | Freq: Every day | ORAL | 0 refills | Status: AC

## 2018-03-28 MED ORDER — LEVOFLOXACIN 750 MG PO TABS: 750.0000 mg | ORAL_TABLET | ORAL | 0 refills | Status: AC

## 2018-03-28 MED ORDER — MORPHINE SULFATE (CONCENTRATE) 10 MG/0.5ML PO SOLN: 5.0000 mg | ORAL | 0 refills | Status: DC | PRN

## 2018-03-28 MED ORDER — MORPHINE SULFATE (CONCENTRATE) 10 MG/0.5ML PO SOLN
5.0000 mg | ORAL | Status: DC | PRN
  Administered 2018-03-28: 5 mg via ORAL
  Filled 2018-03-28: qty 0.5

## 2018-03-28 MED ORDER — BISACODYL 10 MG RE SUPP
10.0000 mg | RECTAL | Status: AC
  Administered 2018-03-28: 10 mg via RECTAL
  Filled 2018-03-28: qty 1

## 2018-03-28 MED ORDER — RIVAROXABAN 20 MG PO TABS: 20.0000 mg | ORAL_TABLET | Freq: Every day | ORAL | Status: DC

## 2018-03-28 MED ORDER — LORAZEPAM 1 MG PO TABS
1.0000 mg | ORAL_TABLET | Freq: Four times a day (QID) | ORAL | Status: DC | PRN
  Administered 2018-03-28: 1 mg via ORAL
  Filled 2018-03-28: qty 1

## 2018-03-28 MED ORDER — HYDROCODONE-ACETAMINOPHEN 5-325 MG PO TABS: 1.0000 | ORAL_TABLET | ORAL | 0 refills | Status: DC | PRN

## 2018-03-28 MED ORDER — LORAZEPAM 1 MG PO TABS: 1.0000 mg | ORAL_TABLET | Freq: Four times a day (QID) | ORAL | 0 refills | Status: DC | PRN

## 2018-03-28 MED ORDER — PREDNISONE 10 MG PO TABS: ORAL_TABLET | ORAL | 0 refills | Status: DC

## 2018-03-28 MED ORDER — FUROSEMIDE 40 MG PO TABS: 40.0000 mg | ORAL_TABLET | Freq: Every day | ORAL | 0 refills | Status: AC

## 2018-03-28 MED ORDER — SENNOSIDES-DOCUSATE SODIUM 8.6-50 MG PO TABS: 1.0000 | ORAL_TABLET | Freq: Every day | ORAL | 0 refills | Status: AC

## 2018-03-28 MED ORDER — SENNOSIDES-DOCUSATE SODIUM 8.6-50 MG PO TABS: 1.0000 | ORAL_TABLET | Freq: Every day | ORAL | Status: DC

## 2018-03-28 NOTE — Progress Notes (Addendum)
Pt will DC to: [facility]  DC date: 03/28/18 Family notified: [name], daughter Transport by: Corey Harold  RN, patient, and facility notified of DC. Discharge Summary sent to facility. RN given number for report [phone]. DC packet on chart. Ambulance transport requested for patient.   CSW signing off. Thurmond Butts, [place] Social Worker [phone]

## 2018-03-28 NOTE — Progress Notes (Signed)
Patient ID: ANIKO FINNIGAN, female   DOB: [birth year], 84 y.o.   MRN: [id number]  This NP visited patient at the bedside as a follow up to  yesterday's [place] for palliative medicine needs and to meet with family to further clarify plan of care.  Patient did well through the night without BiPAP support.  She is alert and oriented and is able to participate and discussion today regarding plan of care.  Patient and family understand the seriousness of her current medical situation and the likely long-term poor prognosis.  Focus of care at this time is comfort and dignity.  Plan of care --  (MOST) form completed and in hard chart  - focus of care is comfort, quality and dignity - return to ALF with Hospice services in place-placed referral for CM - DNR/DNI, no BiPap - no artificial feeding or hydration now or in the future - avoid hospitalization - symptom management as symptoms arise     - Roxanol and Ativan   Discussed the natural trajectory and expectations at end of life.  Gnosis is clearly less than 6 months and likely much less than that.  Patient is high risk for decompensation  Discussed with patient and her daughter the importance of continued conversation with the medical providers regarding overall plan of care and treatment options,  ensuring decisions are within the context of the patients values and GOCs.  Questions and concerns addressed   Discussed with Dr Posey Pronto  Total time spent on the unit was 45 minutes  Greater than 50% of the time was spent in counseling and coordination of care  Wadie Lessen NP  Palliative Medicine Team Team Phone # [phone] Pager [phone]

## 2018-03-28 NOTE — Progress Notes (Signed)
Pt being discharged per physician order to [facility] for the continuation of comfort/pallative care. Education has been reviewed; pt nor family have questions at this time. I provided pt report information to Dortha Kern, LPN at [phone]. Transport has been notified, family will follow to facility. All belongings, pt cell phone, robe, and incontinent briefs have been returned to daughter with pt permission.

## 2018-03-28 NOTE — Clinical Social Work Placement (Signed)
   CLINICAL SOCIAL WORK PLACEMENT  NOTE  Date:  03/28/2018  Patient Details  Name: [name] MRN: [id number] Date of Birth: 12-Mar-1931  Clinical Social Work is seeking post-discharge placement for this patient at the [place] level of care (*CSW will initial, date and re-position this form in  chart as items are completed):      Patient/family provided with [facility] Work Department's list of facilities offering this level of care within the geographic area requested by the patient (or if unable, by the patient's family).      Patient/family informed of their freedom to choose among providers that offer the needed level of care, that participate in Medicare, Medicaid or managed care program needed by the patient, have an available bed and are willing to accept the patient.      Patient/family informed of [facility]'s ownership interest in [facility] and [facility], as well as of the fact that they are under no obligation to receive care at these facilities.  PASRR submitted to EDS on       PASRR number received on       Existing PASRR number confirmed on       FL2 transmitted to all facilities in geographic area requested by pt/family on       FL2 transmitted to all facilities within larger geographic area on       Patient informed that his/her managed care company has contracts with or will negotiate with certain facilities, including the following:            Patient/family informed of bed offers received.  Patient chooses bed at [facility]     Physician recommends and patient chooses bed at      Patient to be transferred to [facility] on 03/28/18.  Patient to be transferred to facility by [facility]     Patient family notified on 03/28/18 of transfer.  Name of family member notified:  [name], daughter     PHYSICIAN       Additional Comment:     _______________________________________________ Vinie Sill, [place] 03/28/2018, 3:47 PM

## 2018-03-28 NOTE — Progress Notes (Signed)
[facility]W-[facility]) RN visit at 63:  Notified by [name], RNCM, of family request for [facility] services after discharge. Chart and patient information under review by [facility] physician. Hospice eligibility has been confirmed by [facility] physician.  Spoke with daughter [name] at bedside to confirm transferring to hospice services from Palliative services as had been discussed earlier today with PMT NP, Wadie Lessen. Daughter verbalized understanding of information given. Per discussion, plan is for discharge today by [facility].  DME needs discussed, patient currently has a hospital bed, walker, wheelchair, 3 in 1 and shower chair. Family denies any needs for additional DME.   Please send signed and completed DNR form with patient and family.  Patient will need prescriptions for discharge comfort medications.  [facility] aware of the above. Completed discharge summary will need to be faxed to [facility] at [phone] when final. Please notify [facility] when patient is ready to leave the unit at discharge. (Call [phone] between 830 am-5 pm. Call [phone] after 5 pm) [facility] information and contact information given to [facility] during this visit. Above information shared with [name], RNCM.  Please call with any hospice related questions or concerns.  Thank you,  Margaretmary Eddy, RN, [facility] Liaison [phone]  [place] are on AMION.

## 2018-03-28 NOTE — Discharge Summary (Signed)
[facility] Discharge Summary   Patient: [name] LOV:[id number]   PCP: Lajean Manes, MD DOB: [birth year]   Date of admission: 03/26/2018   Date of discharge:  03/28/2018    Discharge Diagnoses:  Principal Problem:   Acute on chronic respiratory failure with hypoxia (HCC) Active Problems:   Paroxysmal atrial fibrillation (HCC)   Acute on chronic congestive heart failure (HCC)   Anticoagulant long-term use   Acute on chronic diastolic CHF (congestive heart failure), NYHA class 1 (HCC)   HCAP (healthcare-associated pneumonia)   Benign essential HTN   Polymyalgia rheumatica (HCC)   Goals of care, counseling/discussion   Dyspnea   Palliative care by specialist   DNR (do not resuscitate)   Admitted From: ALF Disposition:  ALF  Recommendations for Outpatient Follow-up:  1. Please follow up with PCP in 1 week   Follow-up [facility] Follow up.   Why:  hospice at ALF Contact information: [place] [place]       Lajean Manes, MD. Schedule an appointment as soon as possible for a visit in 1 week(s).   Specialty:  Internal Medicine Contact information: [address] [place] [address] [phone]          Diet recommendation: regular diet  Activity: The patient is advised to gradually reintroduce usual activities.  Discharge Condition: good  Code Status: DNR DNI comfort care with hospice  History of present illness: As per the H and P dictated on admission, "[name] is a 84 y.o. female with medical history significant of HTN; PMR; afib; pacemaker placement; dermatomyositis; COPD; and CHF presenting with SOB.  The patient called her daughter (the facility had called the last 2 days) - she complained of being "sick" - her daughter thinks she has she gets so much congestion it makes her nauseated.  She is usually on 3L O2 (increased like week by  pulm) and sats were no higher 84%.  She was admitted previously last week, seems to tolerate BIPAP ok.  Palliative care called her daughter Friday and were surprised by her decline and recommended a move to Hospice care (within the facility).  They had an appointment this AM with PCP to discuss, but they missed the appointment.  Her daughter requests pulm input prior to making this decision.   She has not been eating or drinking since last hospitalization.  Patient was admitted from 9/28-10/2 with acute on chronic respiratory failure attributed to COPD, viral PNA.  She required BIPAP initially.  She followed up with pulm on 10/3 and her O2 was increased to 3-4L.  The patient has shown significant decline and recommendation was for consideration of transition from palliative care to hospice."  Hospital Course:  Summary of her active problems in the hospital is as following. Acute exacerbation of COPD/acute on chronic respiratory failure with hypoxia Likely exacerbated by viral infection/recent pneumonia.  Chest x-ray negative for any infiltrate.  Patient initially required BiPAP but was transitioned to nasal cannula Received steroids with taper, scheduled and PRN nebs, azithromycin and antitussives. I will discharge her on oral prednisone taper and she can resume her chronic home dose prednisone of 10 mg daily.  Patient is on Anoro Ellipta inhaler at home which should be continued.   Goals of care discussion  Pt had a palliative care meeting and they decided on comfort care with hospice at ALF approach.   Acute metabolic encephalopathy  Reported to be confused during hospital stay which has now resolved and at baseline mental status (patient is fully alert and oriented).   Recent right lower extremity DVT Patient is on Xarelto  Acute on chronic combined systolic and diastolic CHF.   Last echo with EF of 45%.  Patient was given IV Lasix while in the hospital for mild symptoms.  Currently  euvolemic.  Continue increased dose Lasix. Continue metoprolol.    Polymyalgia rheumatica On chronic low-dose prednisone.  Essential hypertension Blood pressure elevated.  Continue metoprolol.  History of complete heart block post pacemaker  Stable.  All other chronic medical condition were stable during the hospitalization.  On the day of the discharge the patient's vitals were stable , and no other acute medical condition were reported by patient. the patient was felt safe to be discharge at ALF with hospce.  Consultants: Palliative care  Procedures: none  DISCHARGE MEDICATION: Allergies as of 03/28/2018   No Known Allergies     Medication List    STOP taking these medications   alendronate 70 MG tablet Commonly known as:  FOSAMAX   midodrine 2.5 MG tablet Commonly known as:  PROAMATINE   potassium chloride SA 20 MEQ tablet Commonly known as:  K-DUR,KLOR-CON     TAKE these medications   acetaminophen 325 MG tablet Commonly known as:  TYLENOL Take 325 mg by mouth every 6 (six) hours as needed for mild pain, moderate pain or fever.   albuterol (2.5 MG/3ML) 0.083% nebulizer solution Commonly known as:  PROVENTIL Take 3 mLs (2.5 mg total) by nebulization 3 (three) times daily.   ALPRAZolam 0.25 MG tablet Commonly known as:  XANAX Take 0.25 mg by mouth at bedtime.   escitalopram 10 MG tablet Commonly known as:  LEXAPRO Take 10 mg by mouth daily.   feeding supplement (ENSURE ENLIVE) Liqd Take 237 mLs by mouth daily.   furosemide 40 MG tablet Commonly known as:  LASIX Take 1 tablet (40 mg total) by mouth daily.   guaiFENesin-dextromethorphan 100-10 MG/5ML syrup Commonly known as:  ROBITUSSIN DM Take 10 mLs by mouth every 4 (four) hours as needed for cough.   HYDROcodone-acetaminophen 5-325 MG tablet Commonly known as:  NORCO/VICODIN Take 1 tablet by mouth every 4 (four) hours as needed for moderate pain.   ipratropium-albuterol 0.5-2.5 (3) MG/3ML  Soln Commonly known as:  DUONEB Take 3 mLs by nebulization every 6 (six) hours as needed.   levofloxacin 750 MG tablet Commonly known as:  LEVAQUIN Take 1 tablet (750 mg total) by mouth every other day for 4 days. Start taking on:  03/29/2018   LORazepam 1 MG tablet Commonly known as:  ATIVAN Take 1 tablet (1 mg total) by mouth every 6 (six) hours as needed for anxiety.   metoprolol succinate 25 MG 24 hr tablet Commonly known as:  TOPROL-XL Take 25 mg by mouth daily.   mirtazapine 15 MG tablet Commonly known as:  REMERON Take 7.5 mg by mouth at bedtime.   morphine CONCENTRATE 10 MG/0.5ML Soln concentrated solution Take 0.25 mLs (5 mg total) by mouth every 4 (four) hours as needed for moderate pain or shortness of breath.   ondansetron 4 MG tablet Commonly known as:  ZOFRAN Take 4 mg by mouth every 6 (six) hours as needed for nausea or vomiting.   OXYGEN Inhale 2 L into the lungs continuous.   predniSONE 10 MG tablet Commonly known as:  DELTASONE Take 40mg  daily for 3days,Take 30mg  daily for 3days,Take 20mg   daily for 3days,Take 10mg  daily What changed:    how much to take  how to take this  when to take this  additional instructions  Another medication with the same name was removed. Continue taking this medication, and follow the directions you see here.   rivaroxaban 20 MG Tabs tablet Commonly known as:  XARELTO Take 1 tablet (20 mg total) by mouth daily with supper. Start taking on:  04/12/2018 What changed:    medication strength  how much to take  when to take this  additional instructions  These instructions start on 04/12/2018. If you are unsure what to do until then, ask your doctor or other care provider.   senna-docusate 8.6-50 MG tablet Commonly known as:  Senokot-S Take 1 tablet by mouth at bedtime.   umeclidinium-vilanterol 62.5-25 MCG/INH Aepb Commonly known as:  ANORO ELLIPTA Inhale 1 puff into the lungs daily.      No Known  Allergies Discharge Instructions    Diet - low sodium heart healthy   Complete by:  As directed    Increase activity slowly   Complete by:  As directed      Discharge Exam: Filed Weights   03/26/18 1633  Weight: 62.3 kg   Vitals:   03/28/18 0826 03/28/18 0851  BP:  (!) 165/76  Pulse:  72  Resp:    Temp:  98.1 F (36.7 C)  SpO2: 98% 91%   General: Appear in moderate distress, no Rash; Oral Mucosa moist. Cardiovascular: S1 and S2 Present, no Murmur, no JVD Respiratory: Bilateral Air entry present and bilateral Crackles, bilateral expiratory  wheezes Abdomen: Bowel Sound present, Soft and no tenderness Extremities: no Pedal edema, no calf tenderness Neurology: Grossly no focal neuro deficit.  The results of significant diagnostics from this hospitalization (including imaging, microbiology, ancillary and laboratory) are listed below for reference.    Significant Diagnostic Studies: Dg Chest Port 1 View  Result Date: 03/26/2018 CLINICAL DATA:  84 year old female with respiratory distress, hypoxia, rales EXAM: PORTABLE CHEST 1 VIEW COMPARISON:  Prior chest x-ray 03/20/2018 FINDINGS: Right subclavian approach cardiac rhythm maintenance device. Leads project over the right atrium and right ventricle. Stable cardiomegaly. Atherosclerotic calcifications again noted in the transverse aorta. Inspiratory volumes are lower today. There is ill-defined airspace opacity in the right upper lung. Slightly increased pulmonary vascular congestion with mild interstitial edema. Probable small bilateral layering pleural effusions. No acute osseous abnormality. IMPRESSION: 1. New ill-defined airspace opacity in the right upper lobe concerning for bronchopneumonia. 2. Cardiomegaly with mild pulmonary edema likely consistent with mild CHF. 3. Small bilateral pleural effusions. 4.  Aortic Atherosclerosis (ICD10-170.0) Electronically Signed   By: Jacqulynn Cadet M.D.   On: 03/26/2018 09:00   Dg Chest Port 1  View  Result Date: 03/20/2018 CLINICAL DATA:  Follow-up. EXAM: PORTABLE CHEST 1 VIEW COMPARISON:  Radiograph of March 17, 2018. FINDINGS: Stable cardiomegaly. No pneumothorax is noted. Right-sided PICC line is unchanged in position. Atherosclerosis of thoracic aorta is noted. Stable bibasilar subsegmental atelectasis is noted with small pleural effusions. Bony thorax is unremarkable. IMPRESSION: Stable cardiomegaly. Stable bibasilar subsegmental atelectasis with small pleural effusions. Aortic Atherosclerosis (ICD10-I70.0). Electronically Signed   By: Marijo Conception, M.D.   On: 03/20/2018 07:53   Dg Chest Port 1 View  Result Date: 03/17/2018 CLINICAL DATA:  Shortness of breath this morning. Wheezing and rales. On BiPAP. EXAM: PORTABLE CHEST 1 VIEW COMPARISON:  01/22/2018 and CT chest 11/20/2017. FINDINGS: Trachea is midline. Heart is enlarged. Thoracic aorta  is calcified. Pacemaker lead tips project over the right atrium and right ventricle. Small left pleural effusion with interstitial accentuation in the left lower lobe. There may be peripheral septal lines the right lung base. IMPRESSION: 1. Question mild congestive heart failure. 2. Left lower lobe subsegmental atelectasis. Difficult to exclude an infectious bronchiolitis. Electronically Signed   By: Lorin Picket M.D.   On: 03/17/2018 10:47    Microbiology: Recent Results (from the past 240 hour(s))  MRSA PCR Screening     Status: Abnormal   Collection Time: 03/26/18 12:42 PM  Result Value Ref Range Status   MRSA by PCR POSITIVE (A) NEGATIVE Final    Comment:        The GeneXpert MRSA Assay (FDA approved for NASAL specimens only), is one component of a comprehensive MRSA colonization surveillance program. It is not intended to diagnose MRSA infection nor to guide or monitor treatment for MRSA infections. RESULT CALLED TO, READ BACK BY AND VERIFIED WITH: Marybelle Killings RN 03/26/18 1551 JDW Performed at [facility]  [address] [place], [place] [address]   Culture, blood (routine x 2) Call MD if unable to obtain prior to antibiotics being given     Status: None (Preliminary result)   Collection Time: 03/26/18  1:00 PM  Result Value Ref Range Status   Specimen Description BLOOD LEFT FOREARM  Final   Special Requests   Final    BOTTLES DRAWN AEROBIC AND ANAEROBIC Blood Culture adequate volume   Culture   Final    NO GROWTH 2 DAYS Performed at [facility], [place] [address] [place], [place] [address]    Report Status PENDING  Incomplete  Culture, blood (routine x 2) Call MD if unable to obtain prior to antibiotics being given     Status: None (Preliminary result)   Collection Time: 03/26/18  1:45 PM  Result Value Ref Range Status   Specimen Description BLOOD LEFT ANTECUBITAL  Final   Special Requests   Final    BOTTLES DRAWN AEROBIC AND ANAEROBIC Blood Culture adequate volume   Culture   Final    NO GROWTH 2 DAYS Performed at [facility], [place] [address], [place], [place] [address]    Report Status PENDING  Incomplete     Labs: CBC: Recent Labs  Lab 03/26/18 0818 03/27/18 0205  WBC 21.5* 19.3*  NEUTROABS 17.1* 18.1*  HGB 12.0 11.3*  HCT 40.8 38.0  MCV 95.8 91.8  PLT 461* 767   Basic Metabolic Panel: Recent Labs  Lab 03/26/18 0818 03/27/18 0205  NA 135 139  K 4.0 3.9  CL 97* 92*  CO2 31 36*  GLUCOSE 144* 173*  BUN 15 19  CREATININE 0.79 0.80  CALCIUM 8.8* 8.8*   Liver Function Tests: Recent Labs  Lab 03/26/18 0818  AST 19  ALT 14  ALKPHOS 85  BILITOT 1.3*  PROT 6.8  ALBUMIN 2.8*   No results for input(s): LIPASE, AMYLASE in the last 168 hours. No results for input(s): AMMONIA in the last 168 hours. Cardiac Enzymes: No results for input(s): CKTOTAL, CKMB, CKMBINDEX, TROPONINI in the last 168 hours. BNP (last 3 results) Recent Labs    11/20/17 0944 03/17/18 0957 03/26/18 0818  BNP 706.9* 695.9* 836.3*   CBG: No results for input(s): GLUCAP in the last 168  hours. Time spent: 35 minutes  Signed:  Berle Mull  [facility]  03/28/2018  , 2:06 PM

## 2018-03-28 NOTE — NC FL2 (Signed)
[place] LEVEL OF CARE SCREENING TOOL     IDENTIFICATION  Patient Name: [name] Birthdate: 30-Aug-1930 Sex: female Admission Date (Current Location): 03/26/2018  [facility] and [place] Number:  Herbalist and Address:  [facility], [place] [address] [place], [place] [address]      Provider Number: [id number]  Attending Physician Name and Address:  Lavina Hamman, MD  Relative Name and Phone Number:  Jill Side [phone]    Current Level of Care: Hospital Recommended Level of Care: [place] Prior Approval Number:    Date Approved/Denied:   PASRR Number:    Discharge Plan: SNF    Current Diagnoses: Patient Active Problem List   Diagnosis Date Noted  . Palliative care by specialist   . DNR (do not resuscitate)   . Acute on chronic respiratory failure with hypoxia ([place]) 03/26/2018  . Weakness generalized 03/21/2018  . Right middle lobe pneumonia ([place]) 11/20/2017  . Elective replacement indicated for cardiac pacemaker battery at end of lifespan 08/09/2017  . Chronic pulmonary aspiration 05/02/2017  . Encounter for palliative care   . Goals of care, counseling/discussion   . Dyspnea   . Stress fracture of thoracic vertebra 03/19/2017  . Dementia ([place]) 03/19/2017  . Non-traumatic compression fracture of T9 thoracic vertebra 01/02/2017  . Polymyalgia rheumatica ([place])   . Postural dizziness with near syncope   . Syncope 10/11/2016  . Postural dizziness with presyncope 10/11/2016  . Chronic diastolic CHF (congestive heart failure) ([place]) 10/05/2016  . Smoker 10/05/2016  . Closed multiple fractures of right upper extremity with ribs with routine healing 10/05/2016  . HCAP (healthcare-associated pneumonia) 10/31/2015  . GOLD COPD II D 10/31/2015  . Leukocytosis 10/31/2015  . Benign essential HTN 10/31/2015  . Anxiety state   . COPD exacerbation ([place]) 08/26/2015  . Systemic hypertension 08/26/2015  .  Hypokalemia 08/26/2015  . Anticoagulant long-term use 08/26/2015  . DM (dermatomyositis) 08/26/2015  . Acute on chronic diastolic CHF (congestive heart failure), NYHA class 1 ([place])   . Sepsis ([place]) 06/07/2015  . CAP (community acquired pneumonia) 06/07/2015  . Acute on chronic congestive heart failure ([place]) 06/07/2015  . Paroxysmal atrial fibrillation ([place]) 09/04/2014  . Pacemaker 07/13/2013  . CHB (complete heart block) ([place]) 07/13/2013  . SSS (sick sinus syndrome) ([place]) 07/13/2013  . Orthostatic hypotension 07/13/2013  . Aortic insufficiency 07/13/2013    Orientation RESPIRATION BLADDER Height & Weight     Self, Time, Situation, Place  O2(nasual cannula 2L/min) External catheter Weight: 137 lb 5.6 oz (62.3 kg) Height:  5\' 6"  (167.6 cm)  BEHAVIORAL SYMPTOMS/MOOD NEUROLOGICAL BOWEL NUTRITION STATUS      Continent Diet(please see discharge summary )  AMBULATORY STATUS COMMUNICATION OF NEEDS Skin     Verbally Normal                       Personal Care Assistance Level of Assistance  Bathing, Feeding, Dressing Bathing Assistance: Limited assistance Feeding assistance: Independent Dressing Assistance: Limited assistance     Functional Limitations Info  Sight, Hearing, Speech Sight Info: Adequate Hearing Info: Adequate Speech Info: Adequate    SPECIAL CARE FACTORS FREQUENCY  PT (By licensed PT), OT (By licensed OT)                    Contractures Contractures Info: Not present    Additional Factors Info  Code Status, Allergies Code Status Info: DNR Allergies Info: NKA  Current Medications (03/28/2018):  This is the current hospital active medication list Current Facility-Administered Medications  Medication Dose Route Frequency Provider Last Rate Last Dose  . acetaminophen (TYLENOL) tablet 650 mg  650 mg Oral Q4H PRN Karmen Bongo, MD      . albuterol (PROVENTIL) (2.5 MG/3ML) 0.083% nebulizer solution 2.5 mg  2.5 mg Nebulization Q2H PRN  Karmen Bongo, MD      . ALPRAZolam Duanne Moron) tablet 0.25 mg  0.25 mg Oral Ivery Quale, MD   0.25 mg at 03/27/18 2149  . arformoterol (BROVANA) nebulizer solution 15 mcg  15 mcg Nebulization BID Shearon Stalls, Rahul P, PA-C   15 mcg at 03/28/18 0826  . aspirin EC tablet 81 mg  81 mg Oral Daily Karmen Bongo, MD   81 mg at 03/28/18 1051  . Chlorhexidine Gluconate Cloth 2 % PADS 6 each  6 each Topical Q0600 Karmen Bongo, MD   6 each at 03/28/18 0500  . escitalopram (LEXAPRO) tablet 10 mg  10 mg Oral Daily Karmen Bongo, MD   10 mg at 03/28/18 1051  . furosemide (LASIX) injection 40 mg  40 mg Intravenous Lillia Mountain, MD   40 mg at 03/28/18 0540  . guaiFENesin-dextromethorphan (ROBITUSSIN DM) 100-10 MG/5ML syrup 10 mL  10 mL Oral Q4H PRN Karmen Bongo, MD      . ipratropium-albuterol (DUONEB) 0.5-2.5 (3) MG/3ML nebulizer solution 3 mL  3 mL Nebulization TID Caren Griffins, MD   3 mL at 03/28/18 1445  . levofloxacin (LEVAQUIN) tablet 750 mg  750 mg Oral Q48H Knox Royalty, NP   750 mg at 03/27/18 1555  . LORazepam (ATIVAN) tablet 1 mg  1 mg Oral Q6H PRN Knox Royalty, NP   1 mg at 03/28/18 1107  . metoprolol succinate (TOPROL-XL) 24 hr tablet 25 mg  25 mg Oral Daily Karmen Bongo, MD   25 mg at 03/28/18 1051  . mirtazapine (REMERON) tablet 7.5 mg  7.5 mg Oral QHS Karmen Bongo, MD   7.5 mg at 03/27/18 2150  . morphine CONCENTRATE 10 MG/0.5ML oral solution 5 mg  5 mg Oral Q1H PRN Knox Royalty, NP      . mupirocin ointment (BACTROBAN) 2 % 1 application  1 application Nasal BID Karmen Bongo, MD   1 application at 27/25/36 1052  . ondansetron (ZOFRAN) injection 4 mg  4 mg Intravenous Q6H PRN Karmen Bongo, MD      . predniSONE (DELTASONE) tablet 40 mg  40 mg Oral Q breakfast Caren Griffins, MD   40 mg at 03/28/18 1051  . [START ON 04/12/2018] rivaroxaban (XARELTO) tablet 20 mg  20 mg Oral Q supper Lavina Hamman, MD      . senna-docusate (Senokot-S) tablet 1 tablet  1  tablet Oral QHS Knox Royalty, NP         Discharge Medications: Please see discharge summary for a list of discharge medications.  Relevant Imaging Results:  Relevant Lab Results:   Additional Information SSN# [id number]  Vinie Sill, LCSWA

## 2018-03-28 NOTE — Care Management Note (Addendum)
Case Management Note  Patient Details  Name: [name] MRN: [id number] Date of Birth: [birth year]  Subjective/Objective:    From [facility], where she was active with [facility] and [facility] for Palliative.  Presents with  Acute/chronic resp failue secondary to  Combined CHF, copd ex, possible HCAP . Following recent hospitalization, she did see palliative care; however, she was not ready for hospice. There were plans for her to see PCP and consider transitioning to hospice on 03/26/18. Unfortunately on 10/7, she had respiratory distress and worsening hypoxia and admitted to hospital.   10/9 [name] RN, BSN - plan is for patient to go back to facility with hospice [facility] via ambulance today.  NCM informed daughter to contact facility to get someone to set with patient, she is contacting facility now.  NCM also contacted [name] with [facility] to inform her that patient will be discharged today via ambulance.  Daughter states she will sit with patient tonight at the facility and she is setting up someone from [facility] instead to sit with patient after tonight.  NCM informed CSW patient is going back to ALF today, will need gold DNR formed signed by MD .  NCM faxed discharge summary to [facility]. CSW will call ptar .                      Action/Plan: DC home with Hospice today.  Expected Discharge Date:                  Expected Discharge Plan:  Home w Hospice Care  In-House Referral:  Clinical Social Work  Discharge planning Services  CM Consult  Post Acute Care Choice:  NA Choice offered to:  NA  DME Arranged:    DME Agency:     HH Arranged:    [place] Agency:     Status of Service:  Completed, signed off  If discussed at [employer], dates discussed:    Additional Comments:  [name], RN 03/28/2018, 12:34 PM

## 2018-03-29 DIAGNOSIS — J449 Chronic obstructive pulmonary disease, unspecified: Secondary | ICD-10-CM | POA: Diagnosis not present

## 2018-03-29 DIAGNOSIS — I10 Essential (primary) hypertension: Secondary | ICD-10-CM | POA: Diagnosis not present

## 2018-03-29 DIAGNOSIS — J189 Pneumonia, unspecified organism: Secondary | ICD-10-CM | POA: Diagnosis not present

## 2018-03-29 DIAGNOSIS — J9610 Chronic respiratory failure, unspecified whether with hypoxia or hypercapnia: Secondary | ICD-10-CM | POA: Diagnosis not present

## 2018-03-29 DIAGNOSIS — I82409 Acute embolism and thrombosis of unspecified deep veins of unspecified lower extremity: Secondary | ICD-10-CM | POA: Diagnosis not present

## 2018-03-29 DIAGNOSIS — I509 Heart failure, unspecified: Secondary | ICD-10-CM | POA: Diagnosis not present

## 2018-03-29 DIAGNOSIS — I4891 Unspecified atrial fibrillation: Secondary | ICD-10-CM | POA: Diagnosis not present

## 2018-03-29 DIAGNOSIS — M353 Polymyalgia rheumatica: Secondary | ICD-10-CM | POA: Diagnosis not present

## 2018-03-29 DIAGNOSIS — I495 Sick sinus syndrome: Secondary | ICD-10-CM | POA: Diagnosis not present

## 2018-03-29 LAB — LEGIONELLA PNEUMOPHILA SEROGP 1 UR AG: L. PNEUMOPHILA SEROGP 1 UR AG: NEGATIVE

## 2018-03-30 DIAGNOSIS — I82409 Acute embolism and thrombosis of unspecified deep veins of unspecified lower extremity: Secondary | ICD-10-CM | POA: Diagnosis not present

## 2018-03-30 DIAGNOSIS — I4891 Unspecified atrial fibrillation: Secondary | ICD-10-CM | POA: Diagnosis not present

## 2018-03-30 DIAGNOSIS — I10 Essential (primary) hypertension: Secondary | ICD-10-CM | POA: Diagnosis not present

## 2018-03-30 DIAGNOSIS — J9610 Chronic respiratory failure, unspecified whether with hypoxia or hypercapnia: Secondary | ICD-10-CM | POA: Diagnosis not present

## 2018-03-30 DIAGNOSIS — I509 Heart failure, unspecified: Secondary | ICD-10-CM | POA: Diagnosis not present

## 2018-03-30 DIAGNOSIS — J449 Chronic obstructive pulmonary disease, unspecified: Secondary | ICD-10-CM | POA: Diagnosis not present

## 2018-03-31 LAB — CULTURE, BLOOD (ROUTINE X 2)
CULTURE: NO GROWTH
CULTURE: NO GROWTH
SPECIAL REQUESTS: ADEQUATE
Special Requests: ADEQUATE

## 2018-04-03 ENCOUNTER — Ambulatory Visit: Payer: Medicare Other | Admitting: Pulmonary Disease

## 2018-04-04 DIAGNOSIS — I10 Essential (primary) hypertension: Secondary | ICD-10-CM | POA: Diagnosis not present

## 2018-04-04 DIAGNOSIS — I509 Heart failure, unspecified: Secondary | ICD-10-CM | POA: Diagnosis not present

## 2018-04-04 DIAGNOSIS — J449 Chronic obstructive pulmonary disease, unspecified: Secondary | ICD-10-CM | POA: Diagnosis not present

## 2018-04-04 DIAGNOSIS — I4891 Unspecified atrial fibrillation: Secondary | ICD-10-CM | POA: Diagnosis not present

## 2018-04-04 DIAGNOSIS — I82409 Acute embolism and thrombosis of unspecified deep veins of unspecified lower extremity: Secondary | ICD-10-CM | POA: Diagnosis not present

## 2018-04-04 DIAGNOSIS — J9610 Chronic respiratory failure, unspecified whether with hypoxia or hypercapnia: Secondary | ICD-10-CM | POA: Diagnosis not present

## 2018-04-05 ENCOUNTER — Ambulatory Visit (INDEPENDENT_AMBULATORY_CARE_PROVIDER_SITE_OTHER): Payer: Medicare Other | Admitting: *Deleted

## 2018-04-05 DIAGNOSIS — I495 Sick sinus syndrome: Secondary | ICD-10-CM

## 2018-04-05 DIAGNOSIS — I442 Atrioventricular block, complete: Secondary | ICD-10-CM

## 2018-04-05 NOTE — Progress Notes (Signed)
Remote pacemaker transmission.   

## 2018-04-11 DIAGNOSIS — J9610 Chronic respiratory failure, unspecified whether with hypoxia or hypercapnia: Secondary | ICD-10-CM | POA: Diagnosis not present

## 2018-04-11 DIAGNOSIS — I10 Essential (primary) hypertension: Secondary | ICD-10-CM | POA: Diagnosis not present

## 2018-04-11 DIAGNOSIS — I4891 Unspecified atrial fibrillation: Secondary | ICD-10-CM | POA: Diagnosis not present

## 2018-04-11 DIAGNOSIS — I509 Heart failure, unspecified: Secondary | ICD-10-CM | POA: Diagnosis not present

## 2018-04-11 DIAGNOSIS — J449 Chronic obstructive pulmonary disease, unspecified: Secondary | ICD-10-CM | POA: Diagnosis not present

## 2018-04-11 DIAGNOSIS — I82409 Acute embolism and thrombosis of unspecified deep veins of unspecified lower extremity: Secondary | ICD-10-CM | POA: Diagnosis not present

## 2018-04-16 DIAGNOSIS — I82409 Acute embolism and thrombosis of unspecified deep veins of unspecified lower extremity: Secondary | ICD-10-CM | POA: Diagnosis not present

## 2018-04-16 DIAGNOSIS — J9610 Chronic respiratory failure, unspecified whether with hypoxia or hypercapnia: Secondary | ICD-10-CM | POA: Diagnosis not present

## 2018-04-16 DIAGNOSIS — I10 Essential (primary) hypertension: Secondary | ICD-10-CM | POA: Diagnosis not present

## 2018-04-16 DIAGNOSIS — J449 Chronic obstructive pulmonary disease, unspecified: Secondary | ICD-10-CM | POA: Diagnosis not present

## 2018-04-16 DIAGNOSIS — I4891 Unspecified atrial fibrillation: Secondary | ICD-10-CM | POA: Diagnosis not present

## 2018-04-16 DIAGNOSIS — I509 Heart failure, unspecified: Secondary | ICD-10-CM | POA: Diagnosis not present

## 2018-04-17 DIAGNOSIS — I82409 Acute embolism and thrombosis of unspecified deep veins of unspecified lower extremity: Secondary | ICD-10-CM | POA: Diagnosis not present

## 2018-04-17 DIAGNOSIS — J449 Chronic obstructive pulmonary disease, unspecified: Secondary | ICD-10-CM | POA: Diagnosis not present

## 2018-04-17 DIAGNOSIS — I509 Heart failure, unspecified: Secondary | ICD-10-CM | POA: Diagnosis not present

## 2018-04-17 DIAGNOSIS — I4891 Unspecified atrial fibrillation: Secondary | ICD-10-CM | POA: Diagnosis not present

## 2018-04-17 DIAGNOSIS — J9610 Chronic respiratory failure, unspecified whether with hypoxia or hypercapnia: Secondary | ICD-10-CM | POA: Diagnosis not present

## 2018-04-17 DIAGNOSIS — I10 Essential (primary) hypertension: Secondary | ICD-10-CM | POA: Diagnosis not present

## 2018-04-18 DIAGNOSIS — I82409 Acute embolism and thrombosis of unspecified deep veins of unspecified lower extremity: Secondary | ICD-10-CM | POA: Diagnosis not present

## 2018-04-18 DIAGNOSIS — I10 Essential (primary) hypertension: Secondary | ICD-10-CM | POA: Diagnosis not present

## 2018-04-18 DIAGNOSIS — I4891 Unspecified atrial fibrillation: Secondary | ICD-10-CM | POA: Diagnosis not present

## 2018-04-18 DIAGNOSIS — J9610 Chronic respiratory failure, unspecified whether with hypoxia or hypercapnia: Secondary | ICD-10-CM | POA: Diagnosis not present

## 2018-04-18 DIAGNOSIS — I509 Heart failure, unspecified: Secondary | ICD-10-CM | POA: Diagnosis not present

## 2018-04-18 DIAGNOSIS — J449 Chronic obstructive pulmonary disease, unspecified: Secondary | ICD-10-CM | POA: Diagnosis not present

## 2018-04-20 DIAGNOSIS — M353 Polymyalgia rheumatica: Secondary | ICD-10-CM | POA: Diagnosis not present

## 2018-04-20 DIAGNOSIS — I4891 Unspecified atrial fibrillation: Secondary | ICD-10-CM | POA: Diagnosis not present

## 2018-04-20 DIAGNOSIS — J189 Pneumonia, unspecified organism: Secondary | ICD-10-CM | POA: Diagnosis not present

## 2018-04-20 DIAGNOSIS — J9610 Chronic respiratory failure, unspecified whether with hypoxia or hypercapnia: Secondary | ICD-10-CM | POA: Diagnosis not present

## 2018-04-20 DIAGNOSIS — I509 Heart failure, unspecified: Secondary | ICD-10-CM | POA: Diagnosis not present

## 2018-04-20 DIAGNOSIS — I82409 Acute embolism and thrombosis of unspecified deep veins of unspecified lower extremity: Secondary | ICD-10-CM | POA: Diagnosis not present

## 2018-04-20 DIAGNOSIS — J449 Chronic obstructive pulmonary disease, unspecified: Secondary | ICD-10-CM | POA: Diagnosis not present

## 2018-04-20 DIAGNOSIS — I495 Sick sinus syndrome: Secondary | ICD-10-CM | POA: Diagnosis not present

## 2018-04-20 DIAGNOSIS — I10 Essential (primary) hypertension: Secondary | ICD-10-CM | POA: Diagnosis not present

## 2018-04-23 ENCOUNTER — Encounter (HOSPITAL_COMMUNITY): Payer: Self-pay

## 2018-04-23 ENCOUNTER — Emergency Department (HOSPITAL_COMMUNITY)

## 2018-04-23 ENCOUNTER — Inpatient Hospital Stay (HOSPITAL_COMMUNITY)
Admission: EM | Admit: 2018-04-23 | Discharge: 2018-04-27 | DRG: 481 | Disposition: A | Discharge status: Skilled Nursing Facility | Attending: Internal Medicine | Admitting: Internal Medicine

## 2018-04-23 DIAGNOSIS — M3313 Other dermatomyositis without myopathy: Secondary | ICD-10-CM | POA: Diagnosis present

## 2018-04-23 DIAGNOSIS — J449 Chronic obstructive pulmonary disease, unspecified: Secondary | ICD-10-CM | POA: Diagnosis present

## 2018-04-23 DIAGNOSIS — I82409 Acute embolism and thrombosis of unspecified deep veins of unspecified lower extremity: Secondary | ICD-10-CM | POA: Diagnosis not present

## 2018-04-23 DIAGNOSIS — S72002D Fracture of unspecified part of neck of left femur, subsequent encounter for closed fracture with routine healing: Secondary | ICD-10-CM | POA: Diagnosis not present

## 2018-04-23 DIAGNOSIS — Z6820 Body mass index (BMI) 20.0-20.9, adult: Secondary | ICD-10-CM

## 2018-04-23 DIAGNOSIS — F329 Major depressive disorder, single episode, unspecified: Secondary | ICD-10-CM | POA: Diagnosis present

## 2018-04-23 DIAGNOSIS — S72142A Displaced intertrochanteric fracture of left femur, initial encounter for closed fracture: Principal | ICD-10-CM | POA: Diagnosis present

## 2018-04-23 DIAGNOSIS — Z86718 Personal history of other venous thrombosis and embolism: Secondary | ICD-10-CM | POA: Diagnosis not present

## 2018-04-23 DIAGNOSIS — E876 Hypokalemia: Secondary | ICD-10-CM | POA: Diagnosis present

## 2018-04-23 DIAGNOSIS — I4510 Unspecified right bundle-branch block: Secondary | ICD-10-CM | POA: Diagnosis not present

## 2018-04-23 DIAGNOSIS — W19XXXA Unspecified fall, initial encounter: Secondary | ICD-10-CM | POA: Diagnosis not present

## 2018-04-23 DIAGNOSIS — I4819 Other persistent atrial fibrillation: Secondary | ICD-10-CM | POA: Diagnosis present

## 2018-04-23 DIAGNOSIS — I110 Hypertensive heart disease with heart failure: Secondary | ICD-10-CM | POA: Diagnosis not present

## 2018-04-23 DIAGNOSIS — Y9301 Activity, walking, marching and hiking: Secondary | ICD-10-CM | POA: Diagnosis present

## 2018-04-23 DIAGNOSIS — I495 Sick sinus syndrome: Secondary | ICD-10-CM | POA: Diagnosis not present

## 2018-04-23 DIAGNOSIS — R0902 Hypoxemia: Secondary | ICD-10-CM | POA: Diagnosis not present

## 2018-04-23 DIAGNOSIS — I5033 Acute on chronic diastolic (congestive) heart failure: Secondary | ICD-10-CM | POA: Diagnosis not present

## 2018-04-23 DIAGNOSIS — D62 Acute posthemorrhagic anemia: Secondary | ICD-10-CM | POA: Diagnosis not present

## 2018-04-23 DIAGNOSIS — M353 Polymyalgia rheumatica: Secondary | ICD-10-CM | POA: Diagnosis not present

## 2018-04-23 DIAGNOSIS — Z66 Do not resuscitate: Secondary | ICD-10-CM | POA: Diagnosis present

## 2018-04-23 DIAGNOSIS — R278 Other lack of coordination: Secondary | ICD-10-CM | POA: Diagnosis not present

## 2018-04-23 DIAGNOSIS — I442 Atrioventricular block, complete: Secondary | ICD-10-CM | POA: Diagnosis present

## 2018-04-23 DIAGNOSIS — Z7901 Long term (current) use of anticoagulants: Secondary | ICD-10-CM | POA: Diagnosis not present

## 2018-04-23 DIAGNOSIS — I10 Essential (primary) hypertension: Secondary | ICD-10-CM | POA: Diagnosis present

## 2018-04-23 DIAGNOSIS — Z823 Family history of stroke: Secondary | ICD-10-CM

## 2018-04-23 DIAGNOSIS — Z87891 Personal history of nicotine dependence: Secondary | ICD-10-CM

## 2018-04-23 DIAGNOSIS — Z96641 Presence of right artificial hip joint: Secondary | ICD-10-CM | POA: Diagnosis present

## 2018-04-23 DIAGNOSIS — D649 Anemia, unspecified: Secondary | ICD-10-CM | POA: Diagnosis not present

## 2018-04-23 DIAGNOSIS — Z7952 Long term (current) use of systemic steroids: Secondary | ICD-10-CM

## 2018-04-23 DIAGNOSIS — R404 Transient alteration of awareness: Secondary | ICD-10-CM | POA: Diagnosis not present

## 2018-04-23 DIAGNOSIS — W010XXA Fall on same level from slipping, tripping and stumbling without subsequent striking against object, initial encounter: Secondary | ICD-10-CM | POA: Diagnosis present

## 2018-04-23 DIAGNOSIS — Z471 Aftercare following joint replacement surgery: Secondary | ICD-10-CM | POA: Diagnosis not present

## 2018-04-23 DIAGNOSIS — F0390 Unspecified dementia without behavioral disturbance: Secondary | ICD-10-CM | POA: Diagnosis not present

## 2018-04-23 DIAGNOSIS — R41841 Cognitive communication deficit: Secondary | ICD-10-CM | POA: Diagnosis not present

## 2018-04-23 DIAGNOSIS — I5032 Chronic diastolic (congestive) heart failure: Secondary | ICD-10-CM | POA: Diagnosis not present

## 2018-04-23 DIAGNOSIS — M2550 Pain in unspecified joint: Secondary | ICD-10-CM | POA: Diagnosis not present

## 2018-04-23 DIAGNOSIS — I480 Paroxysmal atrial fibrillation: Secondary | ICD-10-CM | POA: Diagnosis present

## 2018-04-23 DIAGNOSIS — I4891 Unspecified atrial fibrillation: Secondary | ICD-10-CM | POA: Diagnosis not present

## 2018-04-23 DIAGNOSIS — R58 Hemorrhage, not elsewhere classified: Secondary | ICD-10-CM | POA: Diagnosis not present

## 2018-04-23 DIAGNOSIS — E440 Moderate protein-calorie malnutrition: Secondary | ICD-10-CM | POA: Diagnosis present

## 2018-04-23 DIAGNOSIS — Z9981 Dependence on supplemental oxygen: Secondary | ICD-10-CM | POA: Diagnosis not present

## 2018-04-23 DIAGNOSIS — S72002A Fracture of unspecified part of neck of left femur, initial encounter for closed fracture: Secondary | ICD-10-CM | POA: Diagnosis not present

## 2018-04-23 DIAGNOSIS — R410 Disorientation, unspecified: Secondary | ICD-10-CM | POA: Diagnosis not present

## 2018-04-23 DIAGNOSIS — Z7401 Bed confinement status: Secondary | ICD-10-CM | POA: Diagnosis not present

## 2018-04-23 DIAGNOSIS — Z419 Encounter for procedure for purposes other than remedying health state, unspecified: Secondary | ICD-10-CM

## 2018-04-23 DIAGNOSIS — Z950 Presence of cardiac pacemaker: Secondary | ICD-10-CM | POA: Diagnosis not present

## 2018-04-23 DIAGNOSIS — S8992XA Unspecified injury of left lower leg, initial encounter: Secondary | ICD-10-CM | POA: Diagnosis not present

## 2018-04-23 DIAGNOSIS — R52 Pain, unspecified: Secondary | ICD-10-CM | POA: Diagnosis not present

## 2018-04-23 DIAGNOSIS — J9610 Chronic respiratory failure, unspecified whether with hypoxia or hypercapnia: Secondary | ICD-10-CM | POA: Diagnosis not present

## 2018-04-23 DIAGNOSIS — I509 Heart failure, unspecified: Secondary | ICD-10-CM | POA: Diagnosis not present

## 2018-04-23 DIAGNOSIS — R2689 Other abnormalities of gait and mobility: Secondary | ICD-10-CM | POA: Diagnosis not present

## 2018-04-23 DIAGNOSIS — M25552 Pain in left hip: Secondary | ICD-10-CM | POA: Diagnosis present

## 2018-04-23 DIAGNOSIS — M6281 Muscle weakness (generalized): Secondary | ICD-10-CM | POA: Diagnosis not present

## 2018-04-23 DIAGNOSIS — S81812A Laceration without foreign body, left lower leg, initial encounter: Secondary | ICD-10-CM | POA: Diagnosis present

## 2018-04-23 LAB — CBC
HEMATOCRIT: 40.7 % (ref 36.0–46.0)
Hemoglobin: 12.3 g/dL (ref 12.0–15.0)
MCH: 27.2 pg (ref 26.0–34.0)
MCHC: 30.2 g/dL (ref 30.0–36.0)
MCV: 90 fL (ref 80.0–100.0)
NRBC: 0 % (ref 0.0–0.2)
PLATELETS: 379 10*3/uL (ref 150–400)
RBC: 4.52 MIL/uL (ref 3.87–5.11)
RDW: 14.6 % (ref 11.5–15.5)
WBC: 12.4 10*3/uL — AB (ref 4.0–10.5)

## 2018-04-23 LAB — BASIC METABOLIC PANEL
Anion gap: 6 (ref 5–15)
BUN: 15 mg/dL (ref 8–23)
CHLORIDE: 94 mmol/L — AB (ref 98–111)
CO2: 35 mmol/L — AB (ref 22–32)
Calcium: 8.8 mg/dL — ABNORMAL LOW (ref 8.9–10.3)
Creatinine, Ser: 1.2 mg/dL — ABNORMAL HIGH (ref 0.44–1.00)
GFR calc non Af Amer: 40 mL/min — ABNORMAL LOW (ref >60)
GFR, EST AFRICAN AMERICAN: 46 mL/min — AB (ref >60)
Glucose, Bld: 110 mg/dL — ABNORMAL HIGH (ref 70–99)
Potassium: 2.9 mmol/L — ABNORMAL LOW (ref 3.5–5.1)
Sodium: 135 mmol/L (ref 135–145)

## 2018-04-23 LAB — SURGICAL PCR SCREEN
MRSA, PCR: NEGATIVE
Staphylococcus aureus: NEGATIVE

## 2018-04-23 LAB — HEPARIN LEVEL (UNFRACTIONATED): Heparin Unfractionated: >2.2 IU/mL — ABNORMAL HIGH (ref 0.30–0.70)

## 2018-04-23 LAB — APTT: APTT: 45 s — AB (ref 24–36)

## 2018-04-23 MED ORDER — SODIUM CHLORIDE 0.9 % IV BOLUS
500.0000 mL | Freq: Once | INTRAVENOUS | Status: AC
  Administered 2018-04-23: 500 mL via INTRAVENOUS

## 2018-04-23 MED ORDER — ALBUTEROL SULFATE (2.5 MG/3ML) 0.083% IN NEBU
2.5000 mg | INHALATION_SOLUTION | Freq: Three times a day (TID) | RESPIRATORY_TRACT | Status: DC
  Administered 2018-04-23 – 2018-04-25 (×6): 2.5 mg via RESPIRATORY_TRACT
  Filled 2018-04-23 (×6): qty 3

## 2018-04-23 MED ORDER — IPRATROPIUM-ALBUTEROL 0.5-2.5 (3) MG/3ML IN SOLN: 3.0000 mL | Freq: Four times a day (QID) | RESPIRATORY_TRACT | Status: DC | PRN

## 2018-04-23 MED ORDER — FENTANYL CITRATE (PF) 100 MCG/2ML IJ SOLN
INTRAMUSCULAR | Status: AC
  Filled 2018-04-23: qty 2

## 2018-04-23 MED ORDER — METOPROLOL SUCCINATE ER 25 MG PO TB24
25.0000 mg | ORAL_TABLET | Freq: Every day | ORAL | Status: DC
  Administered 2018-04-23 – 2018-04-26 (×4): 25 mg via ORAL
  Filled 2018-04-23 (×5): qty 1

## 2018-04-23 MED ORDER — HYDROCODONE-ACETAMINOPHEN 5-325 MG PO TABS
1.0000 | ORAL_TABLET | Freq: Four times a day (QID) | ORAL | Status: DC | PRN
  Administered 2018-04-23 – 2018-04-25 (×4): 1 via ORAL
  Administered 2018-04-25 – 2018-04-26 (×4): 2 via ORAL
  Filled 2018-04-23 (×2): qty 1
  Filled 2018-04-23 (×4): qty 2
  Filled 2018-04-23: qty 1
  Filled 2018-04-23 (×2): qty 2

## 2018-04-23 MED ORDER — METHOCARBAMOL 500 MG PO TABS: 500.0000 mg | ORAL_TABLET | Freq: Four times a day (QID) | ORAL | Status: DC | PRN

## 2018-04-23 MED ORDER — PREDNISONE 10 MG PO TABS
10.0000 mg | ORAL_TABLET | Freq: Every day | ORAL | Status: DC
  Administered 2018-04-24 – 2018-04-27 (×4): 10 mg via ORAL
  Filled 2018-04-23 (×4): qty 1

## 2018-04-23 MED ORDER — GUAIFENESIN-DM 100-10 MG/5ML PO SYRP: 10.0000 mL | ORAL_SOLUTION | ORAL | Status: DC | PRN

## 2018-04-23 MED ORDER — HEPARIN (PORCINE) IN NACL 100-0.45 UNIT/ML-% IJ SOLN
850.0000 [IU]/h | INTRAMUSCULAR | Status: DC
  Administered 2018-04-23: 750 [IU]/h via INTRAVENOUS
  Administered 2018-04-25: 850 [IU]/h via INTRAVENOUS
  Filled 2018-04-23 (×2): qty 250

## 2018-04-23 MED ORDER — FENTANYL CITRATE (PF) 100 MCG/2ML IJ SOLN
50.0000 ug | Freq: Once | INTRAMUSCULAR | Status: AC
  Administered 2018-04-23: 50 ug via INTRAVENOUS

## 2018-04-23 MED ORDER — MORPHINE SULFATE (CONCENTRATE) 10 MG/0.5ML PO SOLN: 5.0000 mg | ORAL | Status: DC | PRN

## 2018-04-23 MED ORDER — POTASSIUM CHLORIDE CRYS ER 20 MEQ PO TBCR
40.0000 meq | EXTENDED_RELEASE_TABLET | Freq: Once | ORAL | Status: AC
  Administered 2018-04-23: 40 meq via ORAL
  Filled 2018-04-23: qty 2

## 2018-04-23 MED ORDER — DOCUSATE SODIUM 100 MG PO CAPS
100.0000 mg | ORAL_CAPSULE | Freq: Two times a day (BID) | ORAL | Status: DC
  Administered 2018-04-23 – 2018-04-27 (×9): 100 mg via ORAL
  Filled 2018-04-23 (×9): qty 1

## 2018-04-23 MED ORDER — MORPHINE SULFATE (PF) 2 MG/ML IV SOLN
0.5000 mg | INTRAVENOUS | Status: DC | PRN
  Administered 2018-04-23: 0.5 mg via INTRAVENOUS
  Filled 2018-04-23: qty 1

## 2018-04-23 MED ORDER — SENNOSIDES-DOCUSATE SODIUM 8.6-50 MG PO TABS: 1.0000 | ORAL_TABLET | Freq: Every evening | ORAL | Status: DC | PRN

## 2018-04-23 MED ORDER — FUROSEMIDE 20 MG PO TABS: 40.0000 mg | ORAL_TABLET | Freq: Every day | ORAL | Status: DC

## 2018-04-23 MED ORDER — LORAZEPAM 1 MG PO TABS: 1.0000 mg | ORAL_TABLET | Freq: Four times a day (QID) | ORAL | Status: DC | PRN

## 2018-04-23 MED ORDER — ESCITALOPRAM OXALATE 10 MG PO TABS
10.0000 mg | ORAL_TABLET | Freq: Every day | ORAL | Status: DC
  Administered 2018-04-23 – 2018-04-27 (×5): 10 mg via ORAL
  Filled 2018-04-23 (×5): qty 1

## 2018-04-23 MED ORDER — METHOCARBAMOL 1000 MG/10ML IJ SOLN
500.0000 mg | Freq: Four times a day (QID) | INTRAVENOUS | Status: DC | PRN
  Filled 2018-04-23: qty 5

## 2018-04-23 MED ORDER — ALPRAZOLAM 0.25 MG PO TABS
0.2500 mg | ORAL_TABLET | Freq: Every day | ORAL | Status: DC
  Administered 2018-04-23 – 2018-04-26 (×4): 0.25 mg via ORAL
  Filled 2018-04-23 (×4): qty 1

## 2018-04-23 NOTE — ED Notes (Signed)
Leg wound dressed

## 2018-04-23 NOTE — Consult Note (Signed)
Reason for Consult:Hip fx Referring Physician: D Deshara Williamson is an 84 y.o. female.  HPI: [name] was walking back from the bathroom last night when she lost her balance and fell. She denies presyncopal symptoms. She had immediate left hip pain and could not get up. She was brought to the ED where x-rays showed a left hip fx and orthopedic surgery was consulted. She lives in assisted living and normally ambulates with a RW. She is on hospice for COPD and is O2 dependent. She is on Xarelto for DVT in RLE.  Past Medical History:  Diagnosis Date  . CHB (complete heart block) ([place]) 07/13/2013   Pacemaker dependent  . CHF (congestive heart failure) ([place])   . COPD (chronic obstructive pulmonary disease) ([place])    on 3L home O2  . DM (dermatomyositis)   . Orthostatic hypotension   . Pacemaker 07/13/2013   Her original pacemaker and the current leads were implanted in 1992. She has had 2 generator change out, most recently in 2010. Her device is a Buyer, retail 2110 non RF dual-chamber pacemaker with a battery longevity estimated at about 7 years. The atrial lead is a St. Jude [id number] and the ventricular lead was a Biotronik [id number].   [name] Paroxysmal atrial fibrillation ([place]) 09/04/2014   on Eliquis  . Polymyalgia rheumatica ([place])   . Scoliosis   . SSS (sick sinus syndrome) ([place]) 07/13/2013  . Syncope   . Systemic hypertension   . Thoracic kyphosis     Past Surgical History:  Procedure Laterality Date  . IR KYPHO THORACIC WITH BONE BIOPSY  01/04/2017  . IR RADIOLOGIST EVAL & MGMT  01/10/2017  . NM MYOCAR PERF WALL MOTION  09/13/2011   Low risk  . PERMANENT PACEMAKER GENERATOR CHANGE  03/27/2009   St.Jude  . PPM GENERATOR CHANGEOUT N/A 08/09/2017   Procedure: PPM GENERATOR CHANGEOUT;  Surgeon: Sanda Klein, MD;  Location: [facility];  Service: Cardiovascular;  Laterality: N/A;  . US ECHOCARDIOGRAPHY  03/28/2012   Mod LAE,mild MR,aortic sclerosis w/mod AI,mod. TR,mild PI,Stage I  diastolic dysfunction  . VIDEO BRONCHOSCOPY Bilateral 10/07/2016   Procedure: VIDEO BRONCHOSCOPY WITH FLUORO;  Surgeon: Marshell Garfinkel, MD;  Location: [facility];  Service: Cardiopulmonary;  Laterality: Bilateral;    Family History  Problem Relation Age of Onset  . Stroke Mother     Social History:  reports that she has quit smoking. Her smoking use included cigarettes. She has a 30.00 pack-year smoking history. She has never used smokeless tobacco. She reports that she does not drink alcohol or use drugs.  Allergies: No Known Allergies  Medications: I have reviewed the patient's current medications.  Results for orders placed or performed during the hospital encounter of 04/23/18 (from the past 48 hour(s))  Basic metabolic panel     Status: Abnormal   Collection Time: 04/23/18  6:48 AM  Result Value Ref Range   Sodium 135 135 - 145 mmol/L   Potassium 2.9 (L) 3.5 - 5.1 mmol/L   Chloride 94 (L) 98 - 111 mmol/L   CO2 35 (H) 22 - 32 mmol/L   Glucose, Bld 110 (H) 70 - 99 mg/dL   BUN 15 8 - 23 mg/dL   Creatinine, Ser 1.20 (H) 0.44 - 1.00 mg/dL   Calcium 8.8 (L) 8.9 - 10.3 mg/dL   GFR calc non Af Amer 40 (L) >60 mL/min   GFR calc Af Amer 46 (L) >60 mL/min    Comment: (NOTE) The  eGFR has been calculated using the CKD EPI equation. This calculation has not been validated in all clinical situations. eGFR's persistently <60 mL/min signify possible Chronic Kidney Disease.    Anion gap 6 5 - 15    Comment: Performed at [facility] [address], [place], [place] [address]  CBC     Status: Abnormal   Collection Time: 04/23/18  6:48 AM  Result Value Ref Range   WBC 12.4 (H) 4.0 - 10.5 K/uL   RBC 4.52 3.87 - 5.11 MIL/uL   Hemoglobin 12.3 12.0 - 15.0 g/dL   HCT 40.7 36.0 - 46.0 %   MCV 90.0 80.0 - 100.0 fL   MCH 27.2 26.0 - 34.0 pg   MCHC 30.2 30.0 - 36.0 g/dL   RDW 14.6 11.5 - 15.5 %   Platelets 379 150 - 400 K/uL   nRBC 0.0 0.0 - 0.2 %    Comment: Performed at [facility], [place] [address], [place], [place] [address]    Dg Pelvis 1-2 Views  Result Date: 04/23/2018 CLINICAL DATA:  Fall EXAM: PELVIS - 1-2 VIEW COMPARISON:  01/13/2011 FINDINGS: There is a new comminuted intertrochanteric proximal left femur fracture with varus deformity. There is an intramedullary rod and dynamic compression screw transfixing the previously visualized right intertrochanteric femur fracture. There is no breakage or loosening of the hardware. Osteopenia. Degenerative changes in the lower lumbar spine. IMPRESSION: New comminuted intertrochanteric proximal left femur fracture. Critical Value/emergent results were called by telephone at the time of interpretation on 04/23/2018 at 8:00 am to Pilar Plate, RN, who verbally acknowledged these results. Electronically Signed   By: Marybelle Killings M.Debbie.   On: 04/23/2018 08:00   Ct Head Wo Contrast  Result Date: 04/23/2018 CLINICAL DATA:  Fall today.  No loss of consciousness. EXAM: CT HEAD WITHOUT CONTRAST CT CERVICAL SPINE WITHOUT CONTRAST TECHNIQUE: Multidetector CT imaging of the head and cervical spine was performed following the standard protocol without intravenous contrast. Multiplanar CT image reconstructions of the cervical spine were also generated. COMPARISON:  None. FINDINGS: CT HEAD FINDINGS Brain: Mild chronic ischemic white matter disease is noted. No mass effect or midline shift is noted. Ventricular size is within normal limits. There is no evidence of mass lesion, hemorrhage or acute infarction. Vascular: No hyperdense vessel or unexpected calcification. Skull: Normal. Negative for fracture or focal lesion. Sinuses/Orbits: No acute finding. Other: None. CT CERVICAL SPINE FINDINGS Alignment: Normal. Skull base and vertebrae: No acute fracture. No primary bone lesion or focal pathologic process. Soft tissues and spinal canal: No prevertebral fluid or swelling. No visible canal hematoma. Disc levels: Mild degenerative disc disease is  noted at C3-4 C4-5 and C5-6. Upper chest: Probable scarring is noted in the right upper lobe. Other: Mild degenerative changes are seen involving posterior facet joints bilaterally. IMPRESSION: Mild chronic ischemic white matter disease. No acute intracranial abnormality seen. Mild multilevel degenerative disc disease. No acute abnormality seen in the cervical spine. Electronically Signed   By: Marijo Conception, M.Debbie.   On: 04/23/2018 08:28   Ct Cervical Spine Wo Contrast  Result Date: 04/23/2018 CLINICAL DATA:  Fall today.  No loss of consciousness. EXAM: CT HEAD WITHOUT CONTRAST CT CERVICAL SPINE WITHOUT CONTRAST TECHNIQUE: Multidetector CT imaging of the head and cervical spine was performed following the standard protocol without intravenous contrast. Multiplanar CT image reconstructions of the cervical spine were also generated. COMPARISON:  None. FINDINGS: CT HEAD FINDINGS Brain: Mild chronic ischemic white matter  disease is noted. No mass effect or midline shift is noted. Ventricular size is within normal limits. There is no evidence of mass lesion, hemorrhage or acute infarction. Vascular: No hyperdense vessel or unexpected calcification. Skull: Normal. Negative for fracture or focal lesion. Sinuses/Orbits: No acute finding. Other: None. CT CERVICAL SPINE FINDINGS Alignment: Normal. Skull base and vertebrae: No acute fracture. No primary bone lesion or focal pathologic process. Soft tissues and spinal canal: No prevertebral fluid or swelling. No visible canal hematoma. Disc levels: Mild degenerative disc disease is noted at C3-4 C4-5 and C5-6. Upper chest: Probable scarring is noted in the right upper lobe. Other: Mild degenerative changes are seen involving posterior facet joints bilaterally. IMPRESSION: Mild chronic ischemic white matter disease. No acute intracranial abnormality seen. Mild multilevel degenerative disc disease. No acute abnormality seen in the cervical spine. Electronically Signed   By:  Marijo Conception, M.Debbie.   On: 04/23/2018 08:28   Dg Femur Min 2 Views Left  Result Date: 04/23/2018 CLINICAL DATA:  [place] this morning.  Left hip and femur pain. EXAM: LEFT FEMUR 2 VIEWS COMPARISON:  12/28/2016 and 04/23/2018 FINDINGS: Displaced left femoral intertrochanteric fracture. Visualized left pelvic bone structures are intact. Left hip is located. Cortical thickening in the mid and distal left femur is suggestive for old injury. Left knee is located with joint space narrowing and degenerative changes. Joint space narrowing and degenerative disease in the medial and inferior left hip joint. IMPRESSION: Displaced left femoral intertrochanteric fracture. Degenerative changes in the left hip and left knee. Electronically Signed   By: Markus Daft M.Debbie.   On: 04/23/2018 08:02    Review of Systems  Constitutional: Negative for weight loss.  HENT: Negative for ear discharge, ear pain, hearing loss and tinnitus.   Eyes: Negative for blurred vision, double vision, photophobia and pain.  Respiratory: Negative for cough, sputum production and shortness of breath.   Cardiovascular: Negative for chest pain.  Gastrointestinal: Negative for abdominal pain, nausea and vomiting.  Genitourinary: Negative for dysuria, flank pain, frequency and urgency.  Musculoskeletal: Positive for joint pain (Left hip). Negative for back pain, falls, myalgias and neck pain.  Neurological: Negative for dizziness, tingling, sensory change, focal weakness, loss of consciousness and headaches.  Endo/Heme/Allergies: Does not bruise/bleed easily.  Psychiatric/Behavioral: Negative for depression, memory loss and substance abuse. The patient is not nervous/anxious.    Blood pressure 122/62, pulse 71, temperature 98.1 F (36.7 C), temperature source Oral, resp. rate (!) 25, height '5\' 5"'  (1.651 m), weight 56.2 kg, SpO2 96 %. Physical Exam  Constitutional: She appears well-developed and well-nourished. No distress.  HENT:  Head:  Normocephalic and atraumatic.  Eyes: Conjunctivae are normal. Right eye exhibits no discharge. Left eye exhibits no discharge. No scleral icterus.  Neck: Normal range of motion.  Cardiovascular: Normal rate and regular rhythm.  Respiratory: Effort normal. No respiratory distress.  Musculoskeletal:  LLE No ecchymosis or rash  Hip TTP, knee TTP medially, large skin tear lateral calf  No knee or ankle effusion  Knee stable to varus/ valgus and anterior/posterior stress  Sens DPN, SPN, TN intact  Motor EHL, ext, flex, evers 5/5  DP 2+, PT 1+, No significant edema  Neurological: She is alert.  Skin: Skin is warm and dry. She is not diaphoretic.  Psychiatric: She has a normal mood and affect. Her behavior is normal.    Assessment/Plan: Fall Left hip fx -- Plan IMN by Dr. Durward Fortes, likely tomorrow or Wednesday to give time for Xarelto  to wear off. I'm assuming medicine will want her on a heparin gtts in the meantime. She will need cessation 6h before time of surgery. LLE skin tear -- Local care Multiple medical problems including O2 dependent COPD, SSS, HTN, DVT on Xarelto, CHF -- per IM who will admit    Debbie Abu, PA-C Orthopedic Surgery [phone] 04/23/2018, 9:30 AM

## 2018-04-23 NOTE — ED Notes (Addendum)
Messaged pharmacy about verifying pt medications

## 2018-04-23 NOTE — H&P (Signed)
History and Physical    [name]:[id number] DOB: 04-23-1931 DOA: 04/23/2018  PCP: Lajean Manes, MD Consultants:  None Patient coming from: [facility];  NOK: Daughter  Chief Complaint: Fall  HPI: [name] is a 84 y.o. female with medical history significant of HTN; PMR; afib on Xarelto; dementia; pacemaker placement; COPD on 3L home O2; and CHF presenting after a fall.  She went to the bathroom but didn't turn the lights on.  When she finished, she opened the refrigerator to get a drink and fell in the hallway.  She was not lightheaded or dizzy.  She had left hip pain immediately after the fall.  It was maybe 3AM and she was cold while lying in the floor.  She called for help but no one came.  The door opened about [phone] and they found her.   ED Course:  Left hip fracture after a mechanical fall.  Orthopedics has seen, d/c Xarelto and surgery in a few days.  Review of Systems: As per HPI; otherwise review of systems reviewed and negative.   Ambulatory Status:  Ambulated with walker prior to fall - but she was not using this at the time of the fall  Past Medical History:  Diagnosis Date  . CHB (complete heart block) ([place]) 07/13/2013   Pacemaker dependent  . CHF (congestive heart failure) ([place])   . COPD (chronic obstructive pulmonary disease) ([place])    on 3L home O2  . DM (dermatomyositis)   . Orthostatic hypotension   . Pacemaker 07/13/2013   Her original pacemaker and the current leads were implanted in 1992. She has had 2 generator change out, most recently in 2010. Her device is a Buyer, retail 2110 non RF dual-chamber pacemaker with a battery longevity estimated at about 7 years. The atrial lead is a St. Jude [id number] and the ventricular lead was a Biotronik [id number].   [name] Paroxysmal atrial fibrillation ([place]) 09/04/2014   on Eliquis  . Polymyalgia rheumatica ([place])   . Scoliosis   . SSS (sick sinus syndrome) ([place]) 07/13/2013  . Syncope   . Systemic hypertension     . Thoracic kyphosis     Past Surgical History:  Procedure Laterality Date  . IR KYPHO THORACIC WITH BONE BIOPSY  01/04/2017  . IR RADIOLOGIST EVAL & MGMT  01/10/2017  . NM MYOCAR PERF WALL MOTION  09/13/2011   Low risk  . PERMANENT PACEMAKER GENERATOR CHANGE  03/27/2009   St.Jude  . PPM GENERATOR CHANGEOUT N/A 08/09/2017   Procedure: PPM GENERATOR CHANGEOUT;  Surgeon: Sanda Klein, MD;  Location: [facility];  Service: Cardiovascular;  Laterality: N/A;  . US ECHOCARDIOGRAPHY  03/28/2012   Mod LAE,mild MR,aortic sclerosis w/mod AI,mod. TR,mild PI,Stage I diastolic dysfunction  . VIDEO BRONCHOSCOPY Bilateral 10/07/2016   Procedure: VIDEO BRONCHOSCOPY WITH FLUORO;  Surgeon: Marshell Garfinkel, MD;  Location: [facility];  Service: Cardiopulmonary;  Laterality: Bilateral;    Social History   Socioeconomic History  . Marital status: Widowed    Spouse name: Not on file  . Number of children: Not on file  . Years of education: Not on file  . Highest education level: Not on file  Occupational History  . Occupation: retired  Scientific laboratory technician  . Financial resource strain: Not on file  . Food insecurity:    Worry: Not on file    Inability: Not on file  . Transportation needs:    Medical: Not on file    Non-medical: Not  on file  Tobacco Use  . Smoking status: Former Smoker    Packs/day: 0.50    Years: 60.00    Pack years: 30.00    Types: Cigarettes  . Smokeless tobacco: Never Used  . Tobacco comment: quit 08/2017  Substance and Sexual Activity  . Alcohol use: No  . Drug use: No  . Sexual activity: Not Currently    Partners: Female  Lifestyle  . Physical activity:    Days per week: Not on file    Minutes per session: Not on file  . Stress: Not on file  Relationships  . Social connections:    Talks on phone: Not on file    Gets together: Not on file    Attends religious service: Not on file    Active member of club or organization: Not on file    Attends meetings of clubs  or organizations: Not on file    Relationship status: Not on file  . Intimate partner violence:    Fear of current or ex partner: Not on file    Emotionally abused: Not on file    Physically abused: Not on file    Forced sexual activity: Not on file  Other Topics Concern  . Not on file  Social History Narrative  . Not on file    No Known Allergies  Family History  Problem Relation Age of Onset  . Stroke Mother     Prior to Admission medications   Medication Sig Start Date End Date Taking? Authorizing Provider  acetaminophen (TYLENOL) 325 MG tablet Take 325 mg by mouth every 6 (six) hours as needed for mild pain, moderate pain or fever.    Yes [provider]  albuterol (PROVENTIL) (2.5 MG/3ML) 0.083% nebulizer solution Take 3 mLs (2.5 mg total) by nebulization 3 (three) times daily. 03/23/18 03/23/19 Yes Martyn Ehrich, NP  ALPRAZolam Duanne Moron) 0.25 MG tablet Take 0.25 mg by mouth at bedtime.   Yes [provider]  escitalopram (LEXAPRO) 10 MG tablet Take 10 mg by mouth daily.   Yes [provider]  furosemide (LASIX) 40 MG tablet Take 1 tablet (40 mg total) by mouth daily. 03/28/18  Yes Lavina Hamman, MD  guaiFENesin-dextromethorphan (ROBITUSSIN DM) 100-10 MG/5ML syrup Take 10 mLs by mouth every 4 (four) hours as needed for cough. 11/24/17  Yes Jani Gravel, MD  HYDROcodone-acetaminophen (NORCO/VICODIN) 5-325 MG tablet Take 1 tablet by mouth every 4 (four) hours as needed for moderate pain. 03/28/18  Yes Lavina Hamman, MD  ipratropium-albuterol (DUONEB) 0.5-2.5 (3) MG/3ML SOLN Take 3 mLs by nebulization every 6 (six) hours as needed. Patient taking differently: Take 3 mLs by nebulization every 6 (six) hours as needed (spasms of Lung passages).  03/21/18  Yes Dhungel, Nishant, MD  LORazepam (ATIVAN) 1 MG tablet Take 1 tablet (1 mg total) by mouth every 6 (six) hours as needed for anxiety. 03/28/18  Yes Lavina Hamman, MD  metoprolol succinate (TOPROL-XL) 25 MG  24 hr tablet Take 25 mg by mouth daily.    Yes [provider]  Morphine Sulfate (MORPHINE CONCENTRATE) 10 MG/0.5ML SOLN concentrated solution Take 0.25 mLs (5 mg total) by mouth every 4 (four) hours as needed for moderate pain or shortness of breath. 03/28/18  Yes Lavina Hamman, MD  ondansetron (ZOFRAN) 4 MG tablet Take 4 mg by mouth every 6 (six) hours as needed for nausea or vomiting.   Yes [provider]  predniSONE (DELTASONE) 10 MG tablet Take  40mg  daily for 3days,Take 30mg  daily for 3days,Take 20mg  daily for 3days,Take 10mg  daily Patient taking differently: Take 10 mg by mouth daily with breakfast.  03/28/18  Yes Lavina Hamman, MD  rivaroxaban (XARELTO) 20 MG TABS tablet Take 1 tablet (20 mg total) by mouth daily with supper. 04/12/18  Yes Lavina Hamman, MD  senna-docusate (SENOKOT-S) 8.6-50 MG tablet Take 1 tablet by mouth at bedtime. Patient taking differently: Take 1 tablet by mouth at bedtime as needed for mild constipation.  03/28/18  Yes Lavina Hamman, MD  feeding supplement, ENSURE ENLIVE, (ENSURE ENLIVE) LIQD Take 237 mLs by mouth daily. Patient not taking: Reported on 04/23/2018 03/21/18   Dhungel, Flonnie Overman, MD  OXYGEN Inhale 2 L into the lungs continuous.    [provider]  umeclidinium-vilanterol (ANORO ELLIPTA) 62.5-25 MCG/INH AEPB Inhale 1 puff into the lungs daily. Patient not taking: Reported on 04/23/2018 08/16/17   Marshell Garfinkel, MD    Physical Exam: Vitals:   04/23/18 0800 04/23/18 1019 04/23/18 1030 04/23/18 1045  BP: 122/62 (!) 101/55 (!) 120/56 116/64  Pulse: 71 75 75 76  Resp: (!) 25     Temp:      TempSrc:      SpO2: 96% 99% 98% 96%  Weight:      Height:         General: Appears calm and comfortable and is NAD; her only obvious discomfort was when the BP cuff inflated, and then she screamed out and writhed in pain Eyes:   EOMI, normal lids, iris ENT:  grossly normal hearing, lips & tongue, mmm; artificial dentition Neck:  no  LAD, masses or thyromegaly; no carotid bruits Cardiovascular:  RRR, no m/r/g. No LE edema.  Respiratory:   CTA bilaterally with no wheezes/rales/rhonchi.  Normal respiratory effort. Abdomen:  soft, NT, ND, NABS Skin:  Significant abrasion along left lateral lower leg, hemostatic and covered with occlusive bandage Musculoskeletal:  Mild left leg shortening and external rotation Lower extremity:  No LE edema.  Limited foot exam with no ulcerations.  2+ distal pulses. Psychiatric:  grossly normal mood and affect, speech fluent and appropriate, AOx2 Neurologic:  CN 2-12 grossly intact, moves all extremities in coordinated fashion, sensation intact    Radiological Exams on Admission: Dg Pelvis 1-2 Views  Result Date: 04/23/2018 CLINICAL DATA:  Fall EXAM: PELVIS - 1-2 VIEW COMPARISON:  01/13/2011 FINDINGS: There is a new comminuted intertrochanteric proximal left femur fracture with varus deformity. There is an intramedullary rod and dynamic compression screw transfixing the previously visualized right intertrochanteric femur fracture. There is no breakage or loosening of the hardware. Osteopenia. Degenerative changes in the lower lumbar spine. IMPRESSION: New comminuted intertrochanteric proximal left femur fracture. Critical Value/emergent results were called by telephone at the time of interpretation on 04/23/2018 at 8:00 am to Pilar Plate, RN, who verbally acknowledged these results. Electronically Signed   By: Marybelle Killings M.D.   On: 04/23/2018 08:00   Ct Head Wo Contrast  Result Date: 04/23/2018 CLINICAL DATA:  Fall today.  No loss of consciousness. EXAM: CT HEAD WITHOUT CONTRAST CT CERVICAL SPINE WITHOUT CONTRAST TECHNIQUE: Multidetector CT imaging of the head and cervical spine was performed following the standard protocol without intravenous contrast. Multiplanar CT image reconstructions of the cervical spine were also generated. COMPARISON:  None. FINDINGS: CT HEAD FINDINGS Brain: Mild chronic  ischemic white matter disease is noted. No mass effect or midline shift is noted. Ventricular size is within normal limits. There is no evidence  of mass lesion, hemorrhage or acute infarction. Vascular: No hyperdense vessel or unexpected calcification. Skull: Normal. Negative for fracture or focal lesion. Sinuses/Orbits: No acute finding. Other: None. CT CERVICAL SPINE FINDINGS Alignment: Normal. Skull base and vertebrae: No acute fracture. No primary bone lesion or focal pathologic process. Soft tissues and spinal canal: No prevertebral fluid or swelling. No visible canal hematoma. Disc levels: Mild degenerative disc disease is noted at C3-4 C4-5 and C5-6. Upper chest: Probable scarring is noted in the right upper lobe. Other: Mild degenerative changes are seen involving posterior facet joints bilaterally. IMPRESSION: Mild chronic ischemic white matter disease. No acute intracranial abnormality seen. Mild multilevel degenerative disc disease. No acute abnormality seen in the cervical spine. Electronically Signed   By: Marijo Conception, M.D.   On: 04/23/2018 08:28   Ct Cervical Spine Wo Contrast  Result Date: 04/23/2018 CLINICAL DATA:  Fall today.  No loss of consciousness. EXAM: CT HEAD WITHOUT CONTRAST CT CERVICAL SPINE WITHOUT CONTRAST TECHNIQUE: Multidetector CT imaging of the head and cervical spine was performed following the standard protocol without intravenous contrast. Multiplanar CT image reconstructions of the cervical spine were also generated. COMPARISON:  None. FINDINGS: CT HEAD FINDINGS Brain: Mild chronic ischemic white matter disease is noted. No mass effect or midline shift is noted. Ventricular size is within normal limits. There is no evidence of mass lesion, hemorrhage or acute infarction. Vascular: No hyperdense vessel or unexpected calcification. Skull: Normal. Negative for fracture or focal lesion. Sinuses/Orbits: No acute finding. Other: None. CT CERVICAL SPINE FINDINGS Alignment:  Normal. Skull base and vertebrae: No acute fracture. No primary bone lesion or focal pathologic process. Soft tissues and spinal canal: No prevertebral fluid or swelling. No visible canal hematoma. Disc levels: Mild degenerative disc disease is noted at C3-4 C4-5 and C5-6. Upper chest: Probable scarring is noted in the right upper lobe. Other: Mild degenerative changes are seen involving posterior facet joints bilaterally. IMPRESSION: Mild chronic ischemic white matter disease. No acute intracranial abnormality seen. Mild multilevel degenerative disc disease. No acute abnormality seen in the cervical spine. Electronically Signed   By: Marijo Conception, M.D.   On: 04/23/2018 08:28   Dg Femur Min 2 Views Left  Result Date: 04/23/2018 CLINICAL DATA:  [place] this morning.  Left hip and femur pain. EXAM: LEFT FEMUR 2 VIEWS COMPARISON:  12/28/2016 and 04/23/2018 FINDINGS: Displaced left femoral intertrochanteric fracture. Visualized left pelvic bone structures are intact. Left hip is located. Cortical thickening in the mid and distal left femur is suggestive for old injury. Left knee is located with joint space narrowing and degenerative changes. Joint space narrowing and degenerative disease in the medial and inferior left hip joint. IMPRESSION: Displaced left femoral intertrochanteric fracture. Degenerative changes in the left hip and left knee. Electronically Signed   By: Markus Daft M.D.   On: 04/23/2018 08:02    EKG: Independently reviewed.  Atrial paced with rate 70; RBBB; LVH, IVCD with no evidence of acute ischemia; NSCSLT   Labs on Admission: I have personally reviewed the available labs and imaging studies at the time of the admission.  Pertinent labs:   K+ 2.9 Glucose 110 BUN 15/Creatinine 1.20/GFR 40 WBC 12.4   Assessment/Plan Principal Problem:   Closed left hip fracture, initial encounter (HCC) Active Problems:   Paroxysmal atrial fibrillation (HCC)   Hypokalemia   Anticoagulant  long-term use   GOLD COPD II D   Benign essential HTN   Chronic diastolic CHF (congestive heart failure) ([place])  Polymyalgia rheumatica (HCC)   Dementia (HCC)   Left hip fracture -Mechanical fall resulting in hip fracture -Orthopedics consulted -She will need surgical repair after washout from Xarelto - possibly Wednesday -SCDs overnight, start Lovenox post-operatively (or as per ortho) -Pain control with Robxain, Vicodin, and Morphine prn -SW consult for rehab placement -Will need PT consult post-operatively -Hip fracture order set utilized  Afib on Xarelto -Rate controlled on Toprol XL; will continue -Hold Xarelto; will use Heparin drip for ease of dosing perioperatively  Hypokalemia -Repleted with 53mEq PO KCl in the ER -Will check Mag and K+ in the AM  COPD -Continue home O2 -She is on Hospice for this condition and they have been notified of her admission -Home morphine for this issue has been continued -Continue Duonebs; prn Albuterol; Robitussin  HTN -Continue Toprol XL  Chronic diastolic CHF -She appears to be compensated at this time -Will hold home Lasix for now but resume or give IV prn if volume issues are of concern  PMR -Continue prednisone -Consider stress-dosed steroids peri-operatively  Dementia -Appears to be stable at this time  DVT prophylaxis:  SCDs until approved for Lovenox by orthopedics Code Status:  DNR - form is present at bedside Family Communication: None present  Disposition Plan:  SNF once clinically improved Consults called: Orthopedics; SW, Nutrition; will need PT post-operatively  Admission status: Admit - It is my clinical opinion that admission to INPATIENT is reasonable and necessary because of the expectation that this patient will require hospital care that crosses at least 2 midnights to treat this condition based on the medical complexity of the problems presented.  Given the aforementioned information, the predictability of  an adverse outcome is felt to be significant.   Karmen Bongo MD [facility]  If note is complete, please contact covering daytime or nighttime physician. [email or web address] Password [email or web address]  04/23/2018, 11:19 AM

## 2018-04-23 NOTE — ED Notes (Signed)
Multiple skin tears BUE & LLE.  LLE : 6" skin tear lower calf , LEFT ankle (2)  RUE: Bicep  LUE: Elbow

## 2018-04-23 NOTE — ED Provider Notes (Signed)
[facility] EMERGENCY DEPARTMENT Provider Note   CSN: [id number] Arrival date & time: 04/23/18  0109     History   Chief Complaint Chief Complaint  Patient presents with  . Fall    HPI [name] is a 84 y.o. female. Patient is on hospice for CHF and COPD, with additional past medical history including polymyalgia rheumatica, dementia, sick sinus syndrome, paroxysmal atrial fibrillation, on Xarelto for RLE DVT, presenting to the emergency department via EMS after mechanical fall that occurred at her assisted living facility, [facility].  Patient states she was up to use the bathroom and on her way back she fell.  She states she does not know she tripped or lost her balance though states she fell down to the floor and was found on her left side.  She denies head trauma, LOC, or current headache.  Only complaint today is pain to the left leg.  Per EMS, patient has large skin tear to the left lower leg which was dressed in route.  Also questionable left hip fracture given mild deformity.  She was given 250 mcg of fentanyl which provided improvement in pain.  Patient's daughter is at bedside, reports she is at her baseline.  Patient had right hip replacement by Dr. Durward Fortes; patient's daughter is requesting same provider or provider in his practice if fracture is present.  The history is provided by the patient and a relative.    Past Medical History:  Diagnosis Date  . CHB (complete heart block) ([place]) 07/13/2013   Pacemaker dependent  . CHF (congestive heart failure) ([place])   . COPD (chronic obstructive pulmonary disease) ([place])    on 3L home O2  . DM (dermatomyositis)   . Orthostatic hypotension   . Pacemaker 07/13/2013   Her original pacemaker and the current leads were implanted in 1992. She has had 2 generator change out, most recently in 2010. Her device is a Buyer, retail 2110 non RF dual-chamber pacemaker with a battery longevity estimated at about 7  years. The atrial lead is a St. Jude [id number] and the ventricular lead was a Biotronik [id number].   [name] Paroxysmal atrial fibrillation ([place]) 09/04/2014   on Eliquis  . Polymyalgia rheumatica ([place])   . Scoliosis   . SSS (sick sinus syndrome) ([place]) 07/13/2013  . Syncope   . Systemic hypertension   . Thoracic kyphosis     Patient Active Problem List   Diagnosis Date Noted  . Closed left hip fracture, initial encounter ([place]) 04/23/2018  . Palliative care by specialist   . DNR (do not resuscitate)   . Acute on chronic respiratory failure with hypoxia ([place]) 03/26/2018  . Weakness generalized 03/21/2018  . Right middle lobe pneumonia ([place]) 11/20/2017  . Elective replacement indicated for cardiac pacemaker battery at end of lifespan 08/09/2017  . Chronic pulmonary aspiration 05/02/2017  . Encounter for palliative care   . Goals of care, counseling/discussion   . Dyspnea   . Stress fracture of thoracic vertebra 03/19/2017  . Dementia ([place]) 03/19/2017  . Non-traumatic compression fracture of T9 thoracic vertebra 01/02/2017  . Polymyalgia rheumatica ([place])   . Postural dizziness with near syncope   . Syncope 10/11/2016  . Postural dizziness with presyncope 10/11/2016  . Chronic diastolic CHF (congestive heart failure) ([place]) 10/05/2016  . Smoker 10/05/2016  . Closed multiple fractures of right upper extremity with ribs with routine healing 10/05/2016  . HCAP (healthcare-associated pneumonia) 10/31/2015  . GOLD COPD II D 10/31/2015  .  Leukocytosis 10/31/2015  . Benign essential HTN 10/31/2015  . Anxiety state   . COPD exacerbation ([place]) 08/26/2015  . Hypokalemia 08/26/2015  . Anticoagulant long-term use 08/26/2015  . DM (dermatomyositis) 08/26/2015  . Acute on chronic diastolic CHF (congestive heart failure), NYHA class 1 ([place])   . Sepsis ([place]) 06/07/2015  . CAP (community acquired pneumonia) 06/07/2015  . Paroxysmal atrial fibrillation ([place]) 09/04/2014  . Pacemaker 07/13/2013  . CHB  (complete heart block) ([place]) 07/13/2013  . SSS (sick sinus syndrome) ([place]) 07/13/2013  . Orthostatic hypotension 07/13/2013  . Aortic insufficiency 07/13/2013    Past Surgical History:  Procedure Laterality Date  . IR KYPHO THORACIC WITH BONE BIOPSY  01/04/2017  . IR RADIOLOGIST EVAL & MGMT  01/10/2017  . NM MYOCAR PERF WALL MOTION  09/13/2011   Low risk  . PERMANENT PACEMAKER GENERATOR CHANGE  03/27/2009   [employer]  . PPM GENERATOR CHANGEOUT N/A 08/09/2017   Procedure: PPM GENERATOR CHANGEOUT;  Surgeon: Sanda Klein, MD;  Location: [facility];  Service: Cardiovascular;  Laterality: N/A;  . US ECHOCARDIOGRAPHY  03/28/2012   Mod LAE,mild MR,aortic sclerosis w/mod AI,mod. TR,mild PI,Stage I diastolic dysfunction  . VIDEO BRONCHOSCOPY Bilateral 10/07/2016   Procedure: VIDEO BRONCHOSCOPY WITH FLUORO;  Surgeon: Marshell Garfinkel, MD;  Location: [facility];  Service: Cardiopulmonary;  Laterality: Bilateral;     OB History   None      Home Medications    Prior to Admission medications   Medication Sig Start Date End Date Taking? Authorizing Provider  acetaminophen (TYLENOL) 325 MG tablet Take 325 mg by mouth every 6 (six) hours as needed for mild pain, moderate pain or fever.    Yes [provider]  albuterol (PROVENTIL) (2.5 MG/3ML) 0.083% nebulizer solution Take 3 mLs (2.5 mg total) by nebulization 3 (three) times daily. 03/23/18 03/23/19 Yes Martyn Ehrich, NP  ALPRAZolam Duanne Moron) 0.25 MG tablet Take 0.25 mg by mouth at bedtime.   Yes [provider]  escitalopram (LEXAPRO) 10 MG tablet Take 10 mg by mouth daily.   Yes [provider]  furosemide (LASIX) 40 MG tablet Take 1 tablet (40 mg total) by mouth daily. 03/28/18  Yes Lavina Hamman, MD  guaiFENesin-dextromethorphan (ROBITUSSIN DM) 100-10 MG/5ML syrup Take 10 mLs by mouth every 4 (four) hours as needed for cough. 11/24/17  Yes Jani Gravel, MD  HYDROcodone-acetaminophen (NORCO/VICODIN) 5-325 MG tablet  Take 1 tablet by mouth every 4 (four) hours as needed for moderate pain. 03/28/18  Yes Lavina Hamman, MD  ipratropium-albuterol (DUONEB) 0.5-2.5 (3) MG/3ML SOLN Take 3 mLs by nebulization every 6 (six) hours as needed. Patient taking differently: Take 3 mLs by nebulization every 6 (six) hours as needed (spasms of Lung passages).  03/21/18  Yes Dhungel, Nishant, MD  LORazepam (ATIVAN) 1 MG tablet Take 1 tablet (1 mg total) by mouth every 6 (six) hours as needed for anxiety. 03/28/18  Yes Lavina Hamman, MD  metoprolol succinate (TOPROL-XL) 25 MG 24 hr tablet Take 25 mg by mouth daily.    Yes [provider]  Morphine Sulfate (MORPHINE CONCENTRATE) 10 MG/0.5ML SOLN concentrated solution Take 0.25 mLs (5 mg total) by mouth every 4 (four) hours as needed for moderate pain or shortness of breath. 03/28/18  Yes Lavina Hamman, MD  ondansetron (ZOFRAN) 4 MG tablet Take 4 mg by mouth every 6 (six) hours as needed for nausea or vomiting.   Yes [provider]  predniSONE (DELTASONE) 10 MG tablet Take  40mg  daily for 3days,Take 30mg  daily for 3days,Take 20mg  daily for 3days,Take 10mg  daily Patient taking differently: Take 10 mg by mouth daily with breakfast.  03/28/18  Yes Lavina Hamman, MD  rivaroxaban (XARELTO) 20 MG TABS tablet Take 1 tablet (20 mg total) by mouth daily with supper. 04/12/18  Yes Lavina Hamman, MD  senna-docusate (SENOKOT-S) 8.6-50 MG tablet Take 1 tablet by mouth at bedtime. Patient taking differently: Take 1 tablet by mouth at bedtime as needed for mild constipation.  03/28/18  Yes Lavina Hamman, MD  OXYGEN Inhale 2 L into the lungs continuous.    [provider]    Family History Family History  Problem Relation Age of Onset  . Stroke Mother     Social History Social History   Tobacco Use  . Smoking status: Former Smoker    Packs/day: 0.50    Years: 60.00    Pack years: 30.00    Types: Cigarettes  . Smokeless tobacco: Never Used  . Tobacco  comment: quit 08/2017  Substance Use Topics  . Alcohol use: No  . Drug use: No     Allergies   Patient has no known allergies.   Review of Systems Review of Systems  Eyes: Negative for visual disturbance.  Gastrointestinal: Negative for abdominal pain.  Musculoskeletal: Negative for back pain and neck pain.  Skin: Positive for wound.  Neurological: Negative for syncope and headaches.  Hematological: Bruises/bleeds easily.  Psychiatric/Behavioral: Negative for confusion.  All other systems reviewed and are negative.    Physical Exam Updated Vital Signs BP 124/70   Pulse 73   Temp 98.1 F (36.7 C) (Oral)   Resp (!) 25   Ht 5\' 5"  (1.651 m)   Wt 56.2 kg   SpO2 95%   BMI 20.63 kg/m   Physical Exam  Constitutional: She appears well-developed and well-nourished. No distress.  HENT:  Head: Normocephalic and atraumatic.  No scalp hematoma or facial trauma.  Eyes: Conjunctivae are normal.  Neck:  No c-spine tenderness.   Cardiovascular: Normal rate, regular rhythm and intact distal pulses.  Pulmonary/Chest: Effort normal and breath sounds normal.  Abdominal: Soft. Bowel sounds are normal. There is no tenderness.  Musculoskeletal:  Left leg appears shortened and externally rotated. Generalized TTP to left knee. TTP to left upper thigh. Pelvis appears stable. Normal active ROM of right hip. Left proximal lower leg with large superficial skin tear to the lateral aspect. No grossly contaminated. Not actively bleeding. Upper extremities with superficial skin tears, otherwise without evidence of significant injury. Not actively bleeding.  Neurological: She is alert. No cranial nerve deficit. She exhibits normal muscle tone.  Normal sensation to distal extremities.  Skin: Skin is warm.  Psychiatric: She has a normal mood and affect. Her behavior is normal.  Nursing note and vitals reviewed.    ED Treatments / Results  Labs (all labs ordered are listed, but only abnormal  results are displayed) Labs Reviewed  BASIC METABOLIC PANEL - Abnormal; Notable for the following components:      Result Value   Potassium 2.9 (*)    Chloride 94 (*)    CO2 35 (*)    Glucose, Bld 110 (*)    Creatinine, Ser 1.20 (*)    Calcium 8.8 (*)    GFR calc non Af Amer 40 (*)    GFR calc Af Amer 46 (*)    All other components within normal limits  CBC - Abnormal; Notable for the following components:  WBC 12.4 (*)    All other components within normal limits  HEPARIN LEVEL (UNFRACTIONATED) - Abnormal; Notable for the following components:   Heparin Unfractionated >2.20 (*)    All other components within normal limits  APTT - Abnormal; Notable for the following components:   aPTT 45 (*)    All other components within normal limits    EKG EKG Interpretation  Date/Time:  Monday April 23 2018 06:37:21 EST Ventricular Rate:  70 PR Interval:    QRS Duration: 186 QT Interval:  463 QTC Calculation: 500 R Axis:   -83 Text Interpretation:  Ectopic atrial rhythm Short PR interval Right bundle branch block LVH with IVCD and secondary repol abnrm Borderline prolonged QT interval No significant change since last tracing 03/17/18 Confirmed by Pryor Curia [id number]) on 04/23/2018 6:48:45 AM Also confirmed by Pryor Curia [id number]), editor Hattie Perch [phone])  on 04/23/2018 7:18:57 AM   Radiology Dg Pelvis 1-2 Views  Result Date: 04/23/2018 CLINICAL DATA:  Fall EXAM: PELVIS - 1-2 VIEW COMPARISON:  01/13/2011 FINDINGS: There is a new comminuted intertrochanteric proximal left femur fracture with varus deformity. There is an intramedullary rod and dynamic compression screw transfixing the previously visualized right intertrochanteric femur fracture. There is no breakage or loosening of the hardware. Osteopenia. Degenerative changes in the lower lumbar spine. IMPRESSION: New comminuted intertrochanteric proximal left femur fracture. Critical Value/emergent results were called by  telephone at the time of interpretation on 04/23/2018 at 8:00 am to Pilar Plate, RN, who verbally acknowledged these results. Electronically Signed   By: Marybelle Killings M.D.   On: 04/23/2018 08:00   Ct Head Wo Contrast  Result Date: 04/23/2018 CLINICAL DATA:  Fall today.  No loss of consciousness. EXAM: CT HEAD WITHOUT CONTRAST CT CERVICAL SPINE WITHOUT CONTRAST TECHNIQUE: Multidetector CT imaging of the head and cervical spine was performed following the standard protocol without intravenous contrast. Multiplanar CT image reconstructions of the cervical spine were also generated. COMPARISON:  None. FINDINGS: CT HEAD FINDINGS Brain: Mild chronic ischemic white matter disease is noted. No mass effect or midline shift is noted. Ventricular size is within normal limits. There is no evidence of mass lesion, hemorrhage or acute infarction. Vascular: No hyperdense vessel or unexpected calcification. Skull: Normal. Negative for fracture or focal lesion. Sinuses/Orbits: No acute finding. Other: None. CT CERVICAL SPINE FINDINGS Alignment: Normal. Skull base and vertebrae: No acute fracture. No primary bone lesion or focal pathologic process. Soft tissues and spinal canal: No prevertebral fluid or swelling. No visible canal hematoma. Disc levels: Mild degenerative disc disease is noted at C3-4 C4-5 and C5-6. Upper chest: Probable scarring is noted in the right upper lobe. Other: Mild degenerative changes are seen involving posterior facet joints bilaterally. IMPRESSION: Mild chronic ischemic white matter disease. No acute intracranial abnormality seen. Mild multilevel degenerative disc disease. No acute abnormality seen in the cervical spine. Electronically Signed   By: Marijo Conception, M.D.   On: 04/23/2018 08:28   Ct Cervical Spine Wo Contrast  Result Date: 04/23/2018 CLINICAL DATA:  Fall today.  No loss of consciousness. EXAM: CT HEAD WITHOUT CONTRAST CT CERVICAL SPINE WITHOUT CONTRAST TECHNIQUE: Multidetector CT  imaging of the head and cervical spine was performed following the standard protocol without intravenous contrast. Multiplanar CT image reconstructions of the cervical spine were also generated. COMPARISON:  None. FINDINGS: CT HEAD FINDINGS Brain: Mild chronic ischemic white matter disease is noted. No mass effect or midline shift is noted. Ventricular size is within normal limits. There  is no evidence of mass lesion, hemorrhage or acute infarction. Vascular: No hyperdense vessel or unexpected calcification. Skull: Normal. Negative for fracture or focal lesion. Sinuses/Orbits: No acute finding. Other: None. CT CERVICAL SPINE FINDINGS Alignment: Normal. Skull base and vertebrae: No acute fracture. No primary bone lesion or focal pathologic process. Soft tissues and spinal canal: No prevertebral fluid or swelling. No visible canal hematoma. Disc levels: Mild degenerative disc disease is noted at C3-4 C4-5 and C5-6. Upper chest: Probable scarring is noted in the right upper lobe. Other: Mild degenerative changes are seen involving posterior facet joints bilaterally. IMPRESSION: Mild chronic ischemic white matter disease. No acute intracranial abnormality seen. Mild multilevel degenerative disc disease. No acute abnormality seen in the cervical spine. Electronically Signed   By: Marijo Conception, M.D.   On: 04/23/2018 08:28   Dg Femur Min 2 Views Left  Result Date: 04/23/2018 CLINICAL DATA:  [place] this morning.  Left hip and femur pain. EXAM: LEFT FEMUR 2 VIEWS COMPARISON:  12/28/2016 and 04/23/2018 FINDINGS: Displaced left femoral intertrochanteric fracture. Visualized left pelvic bone structures are intact. Left hip is located. Cortical thickening in the mid and distal left femur is suggestive for old injury. Left knee is located with joint space narrowing and degenerative changes. Joint space narrowing and degenerative disease in the medial and inferior left hip joint. IMPRESSION: Displaced left femoral  intertrochanteric fracture. Degenerative changes in the left hip and left knee. Electronically Signed   By: Markus Daft M.D.   On: 04/23/2018 08:02    Procedures Procedures (including critical care time)  Medications Ordered in ED Medications  morphine CONCENTRATE 10 MG/0.5ML oral solution 5 mg (has no administration in time range)  metoprolol succinate (TOPROL-XL) 24 hr tablet 25 mg (25 mg Oral Given 04/23/18 1310)  ALPRAZolam (XANAX) tablet 0.25 mg (has no administration in time range)  escitalopram (LEXAPRO) tablet 10 mg (10 mg Oral Given 04/23/18 1332)  LORazepam (ATIVAN) tablet 1 mg (has no administration in time range)  predniSONE (DELTASONE) tablet 10 mg (has no administration in time range)  senna-docusate (Senokot-S) tablet 1 tablet (has no administration in time range)  albuterol (PROVENTIL) (2.5 MG/3ML) 0.083% nebulizer solution 2.5 mg (2.5 mg Nebulization Given 04/23/18 1419)  guaiFENesin-dextromethorphan (ROBITUSSIN DM) 100-10 MG/5ML syrup 10 mL (has no administration in time range)  ipratropium-albuterol (DUONEB) 0.5-2.5 (3) MG/3ML nebulizer solution 3 mL (has no administration in time range)  HYDROcodone-acetaminophen (NORCO/VICODIN) 5-325 MG per tablet 1-2 tablet (has no administration in time range)  morphine 2 MG/ML injection 0.5 mg (0.5 mg Intravenous Given 04/23/18 1510)  methocarbamol (ROBAXIN) tablet 500 mg (has no administration in time range)    Or  methocarbamol (ROBAXIN) 500 mg in dextrose 5 % 50 mL IVPB (has no administration in time range)  docusate sodium (COLACE) capsule 100 mg (100 mg Oral Given 04/23/18 1333)  fentaNYL (SUBLIMAZE) 100 MCG/2ML injection (has no administration in time range)  heparin ADULT infusion 100 units/mL (25000 units/22mL sodium chloride 0.45%) (has no administration in time range)  sodium chloride 0.9 % bolus 500 mL (0 mLs Intravenous Stopped 04/23/18 1102)  potassium chloride SA (K-DUR,KLOR-CON) CR tablet 40 mEq (40 mEq Oral Given 04/23/18  0921)  fentaNYL (SUBLIMAZE) injection 50 mcg (50 mcg Intravenous Given 04/23/18 1018)     Initial Impression / Assessment and Plan / ED Course  I have reviewed the triage vital signs and the nursing notes.  Pertinent labs & imaging results that were available during my care of the patient were  reviewed by me and considered in my medical decision making (see chart for details).  Clinical Course as of Apr 23 1610  Mon Apr 23, 2018  7741 Patient discussed with Hilbert Odor, PA with orthopedics.  To see patient at bedside.   [JR]  [id number] Will admit to medicine, hold xarelto - heparin drip.    [JR]  [id number] Dr. Lorin Mercy with [facility] accepting admission.   [JR]    Clinical Course User Index [JR] , [place] N, PA-C   Pt with closed displaced comminuted intertrochanteric fracture of left femur after suspected mechanical fall while walking back from the bathroom early this morning. Denies head trauma or LOC, at baseline per daughter. On xarelto for RLE DVT. No obvious neuro deficits. CT head and c-spine are negative. Labs with hypokalemia, no assoc EKG changes. Mild AKI as well. Potassium replaced, IVF given. Pain treated with fentanyl with improvement.  Pt is on hospice care  for COPD and CHF. Patient's daughter at bedside, reporting she is in charge of patient's care. Open to surgical intervention. Consult placed to orthopedics.   Hilbert Odor PA evaluated patient in ED. Recommending medical admission with possible surgery in a few days. D/c xarelto, switch to heparin for current treatment of DVT. Dr. Lorin Mercy with [facility] accepting admission.  Patient discussed with and seen by Dr. Tyrone Nine.  The patient appears reasonably stabilized for admission considering the current resources, flow, and capabilities available in the ED at this time, and I doubt any other [facility] requiring further screening and/or treatment in the ED prior to admission.  Final Clinical Impressions(s) / ED Diagnoses   Final  diagnoses:  Closed displaced intertrochanteric fracture of left femur, initial encounter [facility])    ED Discharge Orders    None       , [place] N, PA-C 04/23/18 Arthur, Stella, DO 04/23/18 1644

## 2018-04-23 NOTE — ED Notes (Signed)
Lunch tray ordered 

## 2018-04-23 NOTE — ED Notes (Signed)
Patient transported to x-ray. ?

## 2018-04-23 NOTE — ED Notes (Signed)
Dressing on patients left lower leg reinforced prior to transport

## 2018-04-23 NOTE — Progress Notes (Signed)
[facility]) ED Visit  Notified that patient sustained a fall at ALF, and was taken to ED.  Met with patient and her daughter, Caren Griffins, at the bedside.  Patient states she is not sure why she fell, denies loss of consciousness or dizziness.  States she is comfortable, but endorses significant left hip pain prior to medication administration.  Had just met with ortho, plan is for tentative nailing on 11/5 or 11/6, allowing time for xarelto to wear off, and admit to floor.  IDT updated.  Primary care giver updated and was at bedside.  Please call with any hospice related questions or concerns,  Thank you, Venia Carbon BSN, [place] (listed in [place]) [phone]

## 2018-04-23 NOTE — ED Notes (Signed)
EDP at bedside  

## 2018-04-23 NOTE — Progress Notes (Signed)
ANTICOAGULATION CONSULT NOTE - Initial Consult  Pharmacy Consult for heparin Indication: atrial fibrillation  Patient Measurements: Height: 5\' 5"  (165.1 cm) Weight: 124 lb (56.2 kg) IBW/kg (Calculated) : 57 Heparin Dosing Weight: 56 kg  Vital Signs: Temp: 98.1 F (36.7 C) (11/04 0638) Temp Source: Oral (11/04 8333) BP: 120/56 (11/04 1030) Pulse Rate: 75 (11/04 1030)  Labs: Recent Labs    04/23/18 0648  HGB 12.3  HCT 40.7  PLT 379  CREATININE 1.20*     Medical History: Past Medical History:  Diagnosis Date  . CHB (complete heart block) ([place]) 07/13/2013   Pacemaker dependent  . CHF (congestive heart failure) ([place])   . COPD (chronic obstructive pulmonary disease) ([place])    on 3L home O2  . DM (dermatomyositis)   . Orthostatic hypotension   . Pacemaker 07/13/2013   Her original pacemaker and the current leads were implanted in 1992. She has had 2 generator change out, most recently in 2010. Her device is a Buyer, retail 2110 non RF dual-chamber pacemaker with a battery longevity estimated at about 7 years. The atrial lead is a St. Jude [id number] and the ventricular lead was a Biotronik [id number].   [name] Paroxysmal atrial fibrillation ([place]) 09/04/2014   on Eliquis  . Polymyalgia rheumatica ([place])   . Scoliosis   . SSS (sick sinus syndrome) ([place]) 07/13/2013  . Syncope   . Systemic hypertension   . Thoracic kyphosis      Assessment: 84 year old female admitted after a fall with resultant hip fracture. She is on Xarelto prior to admission for AFib. She is receiving Xarelto 20 mg po daily - last taken yesterday evening. This dose on the border for requiring a dose adjustment, this will have to be addressed prior to discharge. Planning to change patient to heparin as operative plans are being decided. CBC wnl.   Goal of Therapy:  Heparin level 0.3-0.7 units/ml Monitor platelets by anticoagulation protocol: Yes    Plan:  -Heparin infusion at 750 units/hr -Daily aPTT,  CBC -Continue checking aPTT until HL and aPTT correlate   Harvel Quale 04/23/2018,11:02 AM

## 2018-04-23 NOTE — ED Triage Notes (Addendum)
Patient BIB [facility] for fall at approx 0500 this morning. Patient from [facility] assisted [facility]. Patient states she got up to go to the bathroom and while walking back she "fell". Patient denies dizziness, LOC, or hitting head. Patient's only complaint at this time is LEFT leg pain. Patient also has multiple skin tears bilateral legs/arms. Patient given 250 mg Fentanyl.   Patient currently taking blood thinners. (Xerelto)  BP 108/55 HR 80

## 2018-04-24 DIAGNOSIS — I10 Essential (primary) hypertension: Secondary | ICD-10-CM

## 2018-04-24 DIAGNOSIS — S72142A Displaced intertrochanteric fracture of left femur, initial encounter for closed fracture: Principal | ICD-10-CM

## 2018-04-24 DIAGNOSIS — I5032 Chronic diastolic (congestive) heart failure: Secondary | ICD-10-CM

## 2018-04-24 LAB — CBC
HCT: 36.6 % (ref 36.0–46.0)
HEMOGLOBIN: 10.6 g/dL — AB (ref 12.0–15.0)
MCH: 26.2 pg (ref 26.0–34.0)
MCHC: 29 g/dL — ABNORMAL LOW (ref 30.0–36.0)
MCV: 90.6 fL (ref 80.0–100.0)
NRBC: 0 % (ref 0.0–0.2)
PLATELETS: 324 10*3/uL (ref 150–400)
RBC: 4.04 MIL/uL (ref 3.87–5.11)
RDW: 14.8 % (ref 11.5–15.5)
WBC: 9.9 10*3/uL (ref 4.0–10.5)

## 2018-04-24 LAB — BASIC METABOLIC PANEL
ANION GAP: 8 (ref 5–15)
BUN: 16 mg/dL (ref 8–23)
CALCIUM: 8.2 mg/dL — AB (ref 8.9–10.3)
CO2: 31 mmol/L (ref 22–32)
CREATININE: 0.85 mg/dL (ref 0.44–1.00)
Chloride: 92 mmol/L — ABNORMAL LOW (ref 98–111)
GFR calc non Af Amer: >60 mL/min (ref >60)
Glucose, Bld: 106 mg/dL — ABNORMAL HIGH (ref 70–99)
Potassium: 3.5 mmol/L (ref 3.5–5.1)
SODIUM: 131 mmol/L — AB (ref 135–145)

## 2018-04-24 LAB — APTT
aPTT: 52 seconds — ABNORMAL HIGH (ref 24–36)
aPTT: 94 seconds — ABNORMAL HIGH (ref 24–36)

## 2018-04-24 LAB — MAGNESIUM: Magnesium: 1.7 mg/dL (ref 1.7–2.4)

## 2018-04-24 MED ORDER — BUDESONIDE 0.25 MG/2ML IN SUSP: 0.2500 mg | Freq: Two times a day (BID) | RESPIRATORY_TRACT | Status: DC

## 2018-04-24 MED ORDER — ARFORMOTEROL TARTRATE 15 MCG/2ML IN NEBU
15.0000 ug | INHALATION_SOLUTION | Freq: Two times a day (BID) | RESPIRATORY_TRACT | Status: DC
  Administered 2018-04-24 – 2018-04-27 (×6): 15 ug via RESPIRATORY_TRACT
  Filled 2018-04-24 (×6): qty 2

## 2018-04-24 MED ORDER — ARFORMOTEROL TARTRATE 15 MCG/2ML IN NEBU: 15.0000 ug | INHALATION_SOLUTION | Freq: Two times a day (BID) | RESPIRATORY_TRACT | Status: DC

## 2018-04-24 MED ORDER — MAGNESIUM SULFATE 2 GM/50ML IV SOLN
2.0000 g | Freq: Once | INTRAVENOUS | Status: AC
  Administered 2018-04-24: 2 g via INTRAVENOUS
  Filled 2018-04-24: qty 50

## 2018-04-24 MED ORDER — TIOTROPIUM BROMIDE MONOHYDRATE 18 MCG IN CAPS: 18.0000 ug | ORAL_CAPSULE | Freq: Every day | RESPIRATORY_TRACT | Status: DC

## 2018-04-24 MED ORDER — ENSURE ENLIVE PO LIQD
237.0000 mL | Freq: Two times a day (BID) | ORAL | Status: DC
  Administered 2018-04-27: 237 mL via ORAL

## 2018-04-24 MED ORDER — CHLORHEXIDINE GLUCONATE CLOTH 2 % EX PADS
6.0000 | MEDICATED_PAD | Freq: Once | CUTANEOUS | Status: AC
  Administered 2018-04-25: 6 via TOPICAL

## 2018-04-24 MED ORDER — UMECLIDINIUM BROMIDE 62.5 MCG/INH IN AEPB
1.0000 | INHALATION_SPRAY | Freq: Every day | RESPIRATORY_TRACT | Status: DC
  Administered 2018-04-26 – 2018-04-27 (×2): 1 via RESPIRATORY_TRACT
  Filled 2018-04-24: qty 7

## 2018-04-24 MED ORDER — CHLORHEXIDINE GLUCONATE CLOTH 2 % EX PADS: 6.0000 | MEDICATED_PAD | Freq: Once | CUTANEOUS | Status: DC

## 2018-04-24 MED ORDER — BUDESONIDE 0.25 MG/2ML IN SUSP
0.2500 mg | Freq: Two times a day (BID) | RESPIRATORY_TRACT | Status: DC
  Administered 2018-04-24 – 2018-04-27 (×6): 0.25 mg via RESPIRATORY_TRACT
  Filled 2018-04-24 (×6): qty 2

## 2018-04-24 NOTE — Progress Notes (Signed)
Patient arrived to the unit via bed from the emergency department.  Patient is alert and oriented x 4. Skin assessment complete.  Abrasion to the left lateral leg, inside and outside of ankle. Noted bruises to the arms and legs bilaterally. Placed the floor mats on the floor. Educated the patient on how to reach the staff on the unit.  Lowered the bed and activated the bed alarm.  Will continue to monitor the patient and notify MD as needed.

## 2018-04-24 NOTE — Plan of Care (Signed)
  Problem: Education: Goal: Knowledge of General Education information will improve Description Including pain rating scale, medication(s)/side effects and non-pharmacologic comfort measures Outcome: Progressing   Problem: Health Behavior/Discharge Planning: Goal: Ability to manage health-related needs will improve Outcome: Progressing   Problem: Clinical Measurements: Goal: Ability to maintain clinical measurements within normal limits will improve Outcome: Progressing Goal: Will remain free from infection Outcome: Progressing Goal: Diagnostic test results will improve Outcome: Progressing Goal: Respiratory complications will improve Outcome: Progressing Goal: Cardiovascular complication will be avoided Outcome: Progressing   Problem: Nutrition: Goal: Adequate nutrition will be maintained Outcome: Progressing   Problem: Coping: Goal: Level of anxiety will decrease Outcome: Progressing   Problem: Elimination: Goal: Will not experience complications related to bowel motility Outcome: Progressing Goal: Will not experience complications related to urinary retention Outcome: Progressing   Problem: Pain Managment: Goal: General experience of comfort will improve Outcome: Progressing   Problem: Safety: Goal: Ability to remain free from injury will improve Outcome: Progressing   Problem: Skin Integrity: Goal: Risk for impaired skin integrity will decrease Outcome: Progressing   Problem: Education: Goal: Individualized Educational Video(s) Outcome: Progressing   Problem: Self-Concept: Goal: Ability to maintain and perform role responsibilities to the fullest extent possible will improve Outcome: Progressing   Problem: Pain Management: Goal: Pain level will decrease Outcome: Progressing   Problem: Activity: Goal: Risk for activity intolerance will decrease Outcome: Not Progressing   Problem: Education: Goal: Verbalization of understanding the information  provided (i.e., activity precautions, restrictions, etc) will improve Outcome: Not Progressing

## 2018-04-24 NOTE — Progress Notes (Addendum)
Dressing changed to left lateral fibular area. Measures 20cm x 11cm x no depth. 100% red friable. Placed Xeroform on entire area and covered with ABD pad then wrapped with kerlex. Patient did not tolerate procedure very well, very painful. Will continue to monitor and reinforce as needed.     04/24/18 1005  Wound / Incision (Open or Dehisced) 04/23/18 Non-pressure wound Leg Left bleeding  Date First Assessed/Time First Assessed: 04/23/18 2200   Wound Type: Non-pressure wound  Location: Leg  Location Orientation: Left  Wound Description (Comments): bleeding  Present on Admission: Yes  Dressing Type Gauze (Comment) (Xeroform and wrapped with kerlex)  Dressing Changed Changed  Dressing Status New drainage  Dressing Change Frequency Daily  Site / Wound Assessment Bleeding;Pink;Red  % Wound base Red or Granulating 100%  % Wound base Yellow/Fibrinous Exudate 0%  % Wound base Black/Eschar 0%  % Wound base Other/Granulation Tissue (Comment) 0%  Peri-wound Assessment Intact  Wound Length (cm) 20 cm  Wound Width (cm) 11 cm  Wound Surface Area (cm^2) 220 cm^2  Margins Attached edges (approximated)  Closure None  Drainage Amount Moderate  Drainage Description Serous  Treatment Cleansed (Applied xeroform and wrapped with kerlex)

## 2018-04-24 NOTE — Progress Notes (Signed)
Subjective: Closed, displaced two part IT fracture of the left hip.Comfortable at rest awaiting internal fixture of fracture tomorrow allowing time for xarelto To dilute  Objective: Vital signs in last 24 hours: Temp:  [98.6 F (37 C)-99 F (37.2 C)] 98.6 F (37 C) (11/05 0426) Pulse Rate:  [70-76] 70 (11/05 0822) Resp:  [18-24] 23 (11/05 0426) BP: (101-143)/(55-88) 126/88 (11/05 0426) SpO2:  [92 %-99 %] 97 % (11/05 0840)  Intake/Output from previous day: 11/04 0701 - 11/05 0700 In: 73.4 [I.V.:73.4] Out: -  Intake/Output this shift: No intake/output data recorded.  Recent Labs    04/23/18 0648 04/24/18 0324  HGB 12.3 10.6*   Recent Labs    04/23/18 0648 04/24/18 0324  WBC 12.4* 9.9  RBC 4.52 4.04  HCT 40.7 36.6  PLT 379 324   Recent Labs    04/23/18 0648 04/24/18 0726  NA 135 131*  K 2.9* 3.5  CL 94* 92*  CO2 35* 31  BUN 15 16  CREATININE 1.20* 0.85  GLUCOSE 110* 106*  CALCIUM 8.8* 8.2*   No results for input(s): LABPT, INR in the last 72 hours.  Neurologically intact No cellulitis present Compartment soft LLE shortened and externally rotated Has skin abrasions along ant tib left leg-clean     Assessment/Plan: Plan for closed reduction and IM nail tomorrow-discussed with Mrs Headen and daughter   Garald Balding 04/24/2018, 10:01 AM

## 2018-04-24 NOTE — Progress Notes (Signed)
ANTICOAGULATION CONSULT NOTE - Follow Up Consult  Pharmacy Consult for heparin Indication: atrial fibrillation  Labs: Recent Labs    04/23/18 0648 04/23/18 1212 04/24/18 0324  HGB 12.3  --  10.6*  HCT 40.7  --  36.6  PLT 379  --  324  APTT  --  45* 52*  HEPARINUNFRC  --  >2.20*  --   CREATININE 1.20*  --   --     Assessment: 84yo female subtherapeutic on heparin with initial dosing while oral anticoag on hold for surgical intervention; RN notes that pt keeps bending arm, impeding IV flow; in addition Hgb is down and RN notes that pt is bleeding at wound site though also notes that she is not bleeding through dressings.  Goal of Therapy:  aPTT 66-102 seconds   Plan:  Will increase heparin gtt cautiously by 2 units/kg/hr to 850 units/hr and check level in 8 hours.    Wynona Neat, PharmD, BCPS  04/24/2018,5:43 AM

## 2018-04-24 NOTE — Progress Notes (Signed)
Initial Nutrition Assessment  DOCUMENTATION CODES:   Non-severe (moderate) malnutrition in context of chronic illness  INTERVENTION:   - Ensure Enlive po BID, each supplement provides 350 kcal and 20 grams of protein  - Encourage adequate PO intake  NUTRITION DIAGNOSIS:   Moderate Malnutrition related to chronic illness (COPD, CHF, dementia) as evidenced by mild fat depletion, moderate muscle depletion, percent weight loss (9.8% weight loss in 1 month).  GOAL:   Patient will meet greater than or equal to 90% of their needs  MONITOR:   PO intake, Supplement acceptance, Skin, Weight trends, Labs  REASON FOR ASSESSMENT:   Consult Hip fracture protocol  ASSESSMENT:   84 year old female who presented to the ED on 11/4 after a mechanical fall at ALF. PMH significant for CHF, COPD, polymyalgia rheumatica, dementia, sick sinus syndrome, atrial fibrillation, and DVT. X-ray showed closed displaced intertrochanteric fracture of left femur.  Noted plan for closed reduction and IM nail tomorrow, 11/6.  Spoke with pt and daughter at bedside.  Pt states that her appetite is not good and that she is "not eating much." Pt's daughter confirms this, stating that pt had previously been classified as failure to thrive (2-3 years ago) prior to first time living at ALF. At this time, pt was "not eating at all" and lost weight down to 110-112 lbs. Once pt started at ALF, she gained weight up to UBW range of 140-146 lbs (weight fluctuates based on fluid status given CHF diagnosis).  Pt's daughter reports a 22 lb weight loss over the past 7 weeks due to poor PO intake and dehydration. Per weight history in chart, pt with 13 lb weight loss over the past 1 month. This is a 9.8% weight loss which is significant for timeframe.  Pt states that she typically eats 2 meals daily at ALF. Pt does not normally eat breakfast. For lunch, pt may have soup and a couple bites of other items on her plate. Dinner is  similar to lunch. Pt drinks tea, coffee, and sodas occasionally. Pt does not drink much water. Per pt's daughter, pt has Ensure supplements available to her but does not drink them often (about 3 per week). Pt agreeable to trying Ensure Enlive during admission. Discussed importance of adequate PO intake in healing.  Pt states that she ate a few bites of a sausage, egg, and cheese biscuit and 75% of a muffin this morning for breakfast.  Meal Completion: 75%  Medications reviewed and include: Colace 100 mg BID  Labs reviewed: sodium 131 (L)  NUTRITION - FOCUSED PHYSICAL EXAM:    Most Recent Value  Orbital Region  Mild depletion  Upper Arm Region  Mild depletion  Thoracic and Lumbar Region  No depletion  Buccal Region  Mild depletion  Temple Region  Mild depletion  Clavicle Bone Region  Moderate depletion  Clavicle and Acromion Bone Region  Moderate depletion  Scapular Bone Region  Mild depletion  Dorsal Hand  Mild depletion  Patellar Region  Mild depletion  Anterior Thigh Region  Moderate depletion  Posterior Calf Region  Moderate depletion  Edema (RD Assessment)  Mild [LLE]  Hair  Reviewed  Eyes  Reviewed  Mouth  Reviewed  Skin  Reviewed  Nails  Reviewed       Diet Order:   Diet Order            Diet NPO time specified  Diet effective midnight        Diet regular Room service appropriate? Yes;  Fluid consistency: Thin  Diet effective now              EDUCATION NEEDS:   Education needs have been addressed  Skin:  Skin Assessment: Skin Integrity Issues: Other: non-pressure wound to left leg  Last BM:  11/3  Height:   Ht Readings from Last 1 Encounters:  04/23/18 5\' 5"  (1.651 m)    Weight:   Wt Readings from Last 1 Encounters:  04/23/18 56.2 kg    Ideal Body Weight:  56.82 kg  BMI:  Body mass index is 20.63 kg/m.  Estimated Nutritional Needs:   Kcal:  1400-1600  Protein:  70-80 grams  Fluid:  >/= 1.5 L    Gaynell Face, MS, RD,  LDN Inpatient Clinical Dietitian Pager: [phone] Weekend/After Hours: [phone]

## 2018-04-24 NOTE — Progress Notes (Addendum)
[facility] ([facility]) GIP Visit @ 0900  This is a related and covered GIP admission of 04/23/18 with [facility] diagnosis of COPD per Dr. Konrad Dolores.  Patient has an [name] DNR.  EMS was activated by [facility], after patient was found to by lying on the floor after a fall complaining of left hip pain and inability to get up.  Facility unable to get her up due to extreme pain and deformity, and notified family and EMS.  Hospice was notified that pt would be going to the ED for evaluation.  Pt was admitted on 04/23/18 for fracture of left hip.  This is day 2 of [facility] GIP.  Patient is scheduled to have an IMN of her left hip on 04/24/18, after xarelto washout is complete.  Pt transitioned to heparin IV.  Pt receiving IV morphine and hydrocodone for pain, one 500cc bolus throughout the night, IV heparin, IV magnesium.    Visited pt in the room with her daughter Caren Griffins present.  Pt alert, intermittently mildly confused (baseline per daughter) in no acute distress.  Pt denied pain, advised she had just received pain medication.  On O2 @ 4lpm, denies any breathing difficulties.    Goals of care:  Return to [facility] after completion of surgery and recommended rehab  Discharge planning:  Ongoing.  Daughter to call [facility] facility to determine if they have availability for SNF services after d/c.  Caren Griffins and her mother did not have any questions related to plan at this time.  IDT updated.  Transfer summary and medication list placed on the shadow chart.  Should ambulance transfer be needed at discharge, please use [facility] as they contract this service for [facility].  Please call with any hospice related questions or concerns.  Thank you, Venia Carbon BSN, [facility] Liaison (listed in [place]) [phone]

## 2018-04-24 NOTE — Consult Note (Signed)
[facility] Nurse wound consult note Patient evaluated in [facility] [phone] with the aide of the photo of the massive skin tear of the left lateral lower leg, photo made by her daughter.  I spoke with Rosalio Loud, primary care RN.  She had just dressed the wound.  She states it measures 20 cm x 11.5 cm.  Jocelyn Lamer enacted the skin tear protocol and placed Xeroform gauze and wrapped in kerlex.  The plan is to continue this line of treatment. Reason for Consult: Very large skin tear that occurred during the patient's pre-hospitalization fall, to the left lateral lower leg. Monitor the wound area(s) for worsening of condition such as: Signs/symptoms of infection,  Increase in size,  Development of or worsening of odor, Development of pain, or increased pain at the affected locations.  Notify the medical team if any of these develop.  Thank you for the consult.  Discussed plan of care with the patient and bedside nurse.  [place] nurse will not follow at this time.  Please re-consult the [place] team if needed.  Val Riles, RN, MSN, CWOCN, CNS-BC, pager [phone]

## 2018-04-24 NOTE — Progress Notes (Signed)
ANTICOAGULATION CONSULT NOTE - Follow Up Consult  Pharmacy Consult for Heparin Indication: atrial fibrillation and recent DVT  No Known Allergies  Patient Measurements: Height: 5\' 5"  (001.7 cm) Weight: 124 lb (56.2 kg) IBW/kg (Calculated) : 57 Heparin Dosing Weight: 56.2 kg  Vital Signs: Temp: 98 F (36.7 C) (11/05 1510) Temp Source: Oral (11/05 0426) BP: 107/59 (11/05 1510) Pulse Rate: 70 (11/05 1510)  Labs: Recent Labs    04/23/18 0648 04/23/18 1212 04/24/18 0324 04/24/18 0726 04/24/18 1352  HGB 12.3  --  10.6*  --   --   HCT 40.7  --  36.6  --   --   PLT 379  --  324  --   --   APTT  --  45* 52*  --  94*  HEPARINUNFRC  --  >2.20*  --   --   --   CREATININE 1.20*  --   --  0.85  --     Estimated Creatinine Clearance: 42.9 mL/min (by C-G formula based on SCr of 0.85 mg/dL).  Assessment: Anticoag: Xarelto PTA for AFib (chads vascular 5-) and DVT (8/19)> heparin. LD 11/3. Hgb 12.3>10.6. Plts [id number]. PM note from Rx: Hgb down and bleeding from wound but not through dressings,   APTT 52>94 now in goal range.  Goal of Therapy:  aPTT 66-102 seconds Monitor platelets by anticoagulation protocol: Yes   Plan:  -Heparin infusion at 850 -Daily aPTT, CBC -Continue checking aPTT until HL and aPTT correlate Hip surgey 11/6   Kazia Grisanti S. Alford Highland, PharmD, BCPS Clinical Staff Pharmacist Eilene Ghazi Stillinger 04/24/2018,3:34 PM

## 2018-04-24 NOTE — Progress Notes (Addendum)
PROGRESS NOTE        PATIENT DETAILS Name: [name] Age: 84 y.o. Sex: female Date of Birth: 01-13-1931 Admit Date: 04/23/2018 Admitting Physician Karmen Bongo, MD TWS:[id number], Christiane Ha, MD  Brief Narrative: Patient is a 84 y.o. female with a history of venous thromboembolism (DVT) on Xarelto, atrial fibrillation, COPD on home O2, pacemaker implantation-followed by hospice at home-presenting to the hospital on 11/4 following a mechanical fall, she was found to have a left hip fracture.  She was subsequently admitted to the hospitalist service-orthopedics planning on left hip repair on 11/6.  See below for further details.  Subjective: Lying comfortably in bed.  Pain at left hip appears to be controlled.  No chest pain or shortness of breath.  Assessment/Plan: Left hip fracture: Following a mechanical fall.  Orthopedics planning on surgical repair on 11/6.  Given numerous medical comorbidities including severe COPD on home O2-advanced age with frailty-she will be at high risk for proposed surgical procedure-discussed at length with patient and daughter at bedside-family and patient accepting all risks and want to proceed with surgery.  Recent lower extremity DVT: Per daughter this was found at ALF sometime in August-continue to hold Xarelto.  On IV heparin currently.  Persistent AF: Paced rhythm-reviewed most recent outpatient cardiology note-although she has a chads vascular 5-given advanced age and risk of falls-not considered a good candidate for anticoagulation.  However she is back on Xarelto for the past few months as she was found to have a DVT.  Sick sinus syndrome: Apparently she is 100% pacemaker dependent.  Paced rhythm on telemetry.  Chronic diastolic heart failure: Clinically euvolemic.  Watch closely while on IV fluids.  Severe COPD chronic hypoxic respiratory failure on home O2: Encourage incentive spirometry-start bronchodilators-currently  appears stable-lungs are clear.  Followed by hospice at home  Polymyalgia rheumatica: Appears stable-continue prednisone  Dementia/depression: Appears stable and at baseline-continue Lexapro.  Left leg abrasion/skin breakdown: Large area of skin breakdown in the left upper leg/lower thigh-we will have wound care RN evaluate.  This was secondary to mechanical fall.  Non-severe (moderate) malnutrition in context of chronic illness  DVT Prophylaxis: Full dose anticoagulation with Heparin  Code Status: DNR  Family Communication: Daughter at bedside  Disposition Plan: Remain inpatient-probably SNF on discharge  Antimicrobial agents: Anti-infectives (From admission, onward)   None      Procedures: None  CONSULTS:  orthopedic surgery  Time spent: 25- minutes-Greater than 50% of this time was spent in counseling, explanation of diagnosis, planning of further management, and coordination of care.  MEDICATIONS: Scheduled Meds: . albuterol  2.5 mg Nebulization TID  . ALPRAZolam  0.25 mg Oral QHS  . docusate sodium  100 mg Oral BID  . escitalopram  10 mg Oral Daily  . metoprolol succinate  25 mg Oral Daily  . predniSONE  10 mg Oral Q breakfast   Continuous Infusions: . heparin 850 Units/hr (04/24/18 0545)  . methocarbamol (ROBAXIN) IV     PRN Meds:.guaiFENesin-dextromethorphan, HYDROcodone-acetaminophen, ipratropium-albuterol, LORazepam, methocarbamol **OR** methocarbamol (ROBAXIN) IV, morphine injection, morphine CONCENTRATE, senna-docusate   PHYSICAL EXAM: Vital signs: Vitals:   04/24/18 0009 04/24/18 0426 04/24/18 0822 04/24/18 0840  BP: 130/74 126/88    Pulse: 76 73 70   Resp: (!) 24 (!) 23    Temp: 99 F (37.2 C) 98.6 F (37 C)    TempSrc: Oral  Oral    SpO2: 98% 97%  97%  Weight:      Height:       Filed Weights   04/23/18 0639  Weight: 56.2 kg   Body mass index is 20.63 kg/m.   General appearance :Awake, alert, not in any distress.  HEENT:  Atraumatic and Normocephalic Neck: supple, no JVD. Resp:Good air entry bilaterally, no added sounds  CVS: S1 S2 regular GI: Bowel sounds present, Non tender and not distended with no gaurding, rigidity or rebound. Extremities: B/L Lower Ext shows no edema, both legs are warm to touch Neurology:  Musculoskeletal:No digital cyanosis Skin:No Rash, warm and dry Wounds: Large area of ulceration in the left lower thigh/left upper thigh-awaiting wound care eval  I have personally reviewed following labs and imaging studies  LABORATORY DATA: CBC: Recent Labs  Lab 04/23/18 0648 04/24/18 0324  WBC 12.4* 9.9  HGB 12.3 10.6*  HCT 40.7 36.6  MCV 90.0 90.6  PLT 379 664    Basic Metabolic Panel: Recent Labs  Lab 04/23/18 0648 04/24/18 0324 04/24/18 0726  NA 135  --  131*  K 2.9*  --  3.5  CL 94*  --  92*  CO2 35*  --  31  GLUCOSE 110*  --  106*  BUN 15  --  16  CREATININE 1.20*  --  0.85  CALCIUM 8.8*  --  8.2*  MG  --  1.7  --     GFR: Estimated Creatinine Clearance: 42.9 mL/min (by C-G formula based on SCr of 0.85 mg/dL).  Liver Function Tests: No results for input(s): AST, ALT, ALKPHOS, BILITOT, PROT, ALBUMIN in the last 168 hours. No results for input(s): LIPASE, AMYLASE in the last 168 hours. No results for input(s): AMMONIA in the last 168 hours.  Coagulation Profile: No results for input(s): INR, PROTIME in the last 168 hours.  Cardiac Enzymes: No results for input(s): CKTOTAL, CKMB, CKMBINDEX, TROPONINI in the last 168 hours.  BNP (last 3 results) No results for input(s): PROBNP in the last 8760 hours.  HbA1C: No results for input(s): HGBA1C in the last 72 hours.  CBG: No results for input(s): GLUCAP in the last 168 hours.  Lipid Profile: No results for input(s): CHOL, HDL, LDLCALC, TRIG, CHOLHDL, LDLDIRECT in the last 72 hours.  Thyroid Function Tests: No results for input(s): TSH, T4TOTAL, FREET4, T3FREE, THYROIDAB in the last 72 hours.  Anemia  Panel: No results for input(s): VITAMINB12, FOLATE, FERRITIN, TIBC, IRON, RETICCTPCT in the last 72 hours.  Urine analysis:    Component Value Date/Time   COLORURINE YELLOW 03/17/2018 [place] 03/17/2018 1252   LABSPEC 1.011 03/17/2018 1252   PHURINE 5.0 03/17/2018 1252   GLUCOSEU NEGATIVE 03/17/2018 1252   HGBUR SMALL (A) 03/17/2018 1252   BILIRUBINUR NEGATIVE 03/17/2018 1252   KETONESUR 5 (A) 03/17/2018 1252   PROTEINUR NEGATIVE 03/17/2018 1252   UROBILINOGEN 1.0 01/19/2011 1030   NITRITE NEGATIVE 03/17/2018 1252   LEUKOCYTESUR NEGATIVE 03/17/2018 1252    Sepsis Labs: Lactic Acid, Venous    Component Value Date/Time   LATICACIDVEN 1.76 03/26/2018 0825    MICROBIOLOGY: Recent Results (from the past 240 hour(s))  Surgical pcr screen     Status: None   Collection Time: 04/23/18  9:42 PM  Result Value Ref Range Status   MRSA, PCR NEGATIVE NEGATIVE Final   Staphylococcus aureus NEGATIVE NEGATIVE Final    Comment: (NOTE) The Xpert SA Assay (FDA approved for NASAL specimens in patients 22  years of age and older), is one component of a comprehensive surveillance program. It is not intended to diagnose infection nor to guide or monitor treatment. Performed at [facility], [place] [address], [place], [place] [address]     RADIOLOGY STUDIES/RESULTS: Dg Pelvis 1-2 Views  Result Date: 04/23/2018 CLINICAL DATA:  Fall EXAM: PELVIS - 1-2 VIEW COMPARISON:  01/13/2011 FINDINGS: There is a new comminuted intertrochanteric proximal left femur fracture with varus deformity. There is an intramedullary rod and dynamic compression screw transfixing the previously visualized right intertrochanteric femur fracture. There is no breakage or loosening of the hardware. Osteopenia. Degenerative changes in the lower lumbar spine. IMPRESSION: New comminuted intertrochanteric proximal left femur fracture. Critical Value/emergent results were called by telephone at the time of  interpretation on 04/23/2018 at 8:00 am to Pilar Plate, RN, who verbally acknowledged these results. Electronically Signed   By: Marybelle Killings M.D.   On: 04/23/2018 08:00   Ct Head Wo Contrast  Result Date: 04/23/2018 CLINICAL DATA:  Fall today.  No loss of consciousness. EXAM: CT HEAD WITHOUT CONTRAST CT CERVICAL SPINE WITHOUT CONTRAST TECHNIQUE: Multidetector CT imaging of the head and cervical spine was performed following the standard protocol without intravenous contrast. Multiplanar CT image reconstructions of the cervical spine were also generated. COMPARISON:  None. FINDINGS: CT HEAD FINDINGS Brain: Mild chronic ischemic white matter disease is noted. No mass effect or midline shift is noted. Ventricular size is within normal limits. There is no evidence of mass lesion, hemorrhage or acute infarction. Vascular: No hyperdense vessel or unexpected calcification. Skull: Normal. Negative for fracture or focal lesion. Sinuses/Orbits: No acute finding. Other: None. CT CERVICAL SPINE FINDINGS Alignment: Normal. Skull base and vertebrae: No acute fracture. No primary bone lesion or focal pathologic process. Soft tissues and spinal canal: No prevertebral fluid or swelling. No visible canal hematoma. Disc levels: Mild degenerative disc disease is noted at C3-4 C4-5 and C5-6. Upper chest: Probable scarring is noted in the right upper lobe. Other: Mild degenerative changes are seen involving posterior facet joints bilaterally. IMPRESSION: Mild chronic ischemic white matter disease. No acute intracranial abnormality seen. Mild multilevel degenerative disc disease. No acute abnormality seen in the cervical spine. Electronically Signed   By: Marijo Conception, M.D.   On: 04/23/2018 08:28   Ct Cervical Spine Wo Contrast  Result Date: 04/23/2018 CLINICAL DATA:  Fall today.  No loss of consciousness. EXAM: CT HEAD WITHOUT CONTRAST CT CERVICAL SPINE WITHOUT CONTRAST TECHNIQUE: Multidetector CT imaging of the head and  cervical spine was performed following the standard protocol without intravenous contrast. Multiplanar CT image reconstructions of the cervical spine were also generated. COMPARISON:  None. FINDINGS: CT HEAD FINDINGS Brain: Mild chronic ischemic white matter disease is noted. No mass effect or midline shift is noted. Ventricular size is within normal limits. There is no evidence of mass lesion, hemorrhage or acute infarction. Vascular: No hyperdense vessel or unexpected calcification. Skull: Normal. Negative for fracture or focal lesion. Sinuses/Orbits: No acute finding. Other: None. CT CERVICAL SPINE FINDINGS Alignment: Normal. Skull base and vertebrae: No acute fracture. No primary bone lesion or focal pathologic process. Soft tissues and spinal canal: No prevertebral fluid or swelling. No visible canal hematoma. Disc levels: Mild degenerative disc disease is noted at C3-4 C4-5 and C5-6. Upper chest: Probable scarring is noted in the right upper lobe. Other: Mild degenerative changes are seen involving posterior facet joints bilaterally. IMPRESSION: Mild chronic ischemic white matter disease. No acute intracranial abnormality seen.  Mild multilevel degenerative disc disease. No acute abnormality seen in the cervical spine. Electronically Signed   By: Marijo Conception, M.D.   On: 04/23/2018 08:28   Dg Chest Port 1 View  Result Date: 03/26/2018 CLINICAL DATA:  84 year old female with respiratory distress, hypoxia, rales EXAM: PORTABLE CHEST 1 VIEW COMPARISON:  Prior chest x-ray 03/20/2018 FINDINGS: Right subclavian approach cardiac rhythm maintenance device. Leads project over the right atrium and right ventricle. Stable cardiomegaly. Atherosclerotic calcifications again noted in the transverse aorta. Inspiratory volumes are lower today. There is ill-defined airspace opacity in the right upper lung. Slightly increased pulmonary vascular congestion with mild interstitial edema. Probable small bilateral layering  pleural effusions. No acute osseous abnormality. IMPRESSION: 1. New ill-defined airspace opacity in the right upper lobe concerning for bronchopneumonia. 2. Cardiomegaly with mild pulmonary edema likely consistent with mild CHF. 3. Small bilateral pleural effusions. 4.  Aortic Atherosclerosis (ICD10-170.0) Electronically Signed   By: Jacqulynn Cadet M.D.   On: 03/26/2018 09:00   Dg Femur Min 2 Views Left  Result Date: 04/23/2018 CLINICAL DATA:  [place] this morning.  Left hip and femur pain. EXAM: LEFT FEMUR 2 VIEWS COMPARISON:  12/28/2016 and 04/23/2018 FINDINGS: Displaced left femoral intertrochanteric fracture. Visualized left pelvic bone structures are intact. Left hip is located. Cortical thickening in the mid and distal left femur is suggestive for old injury. Left knee is located with joint space narrowing and degenerative changes. Joint space narrowing and degenerative disease in the medial and inferior left hip joint. IMPRESSION: Displaced left femoral intertrochanteric fracture. Degenerative changes in the left hip and left knee. Electronically Signed   By: Markus Daft M.D.   On: 04/23/2018 08:02     LOS: 1 day   Oren Binet, MD  [facility]  If 7PM-7AM, please contact night-coverage  Please page via [email or web address] [email or web address]-click on MD name and type text message  04/24/2018, 11:19 AM

## 2018-04-25 ENCOUNTER — Inpatient Hospital Stay (HOSPITAL_COMMUNITY): Admitting: Certified Registered Nurse Anesthetist

## 2018-04-25 ENCOUNTER — Inpatient Hospital Stay (HOSPITAL_COMMUNITY)

## 2018-04-25 ENCOUNTER — Encounter (HOSPITAL_COMMUNITY)
Admission: EM | Disposition: A | Discharge status: Skilled Nursing Facility | Payer: Self-pay | Source: Home / Self Care | Attending: Internal Medicine

## 2018-04-25 ENCOUNTER — Encounter (HOSPITAL_COMMUNITY): Payer: Self-pay | Admitting: Certified Registered Nurse Anesthetist

## 2018-04-25 ENCOUNTER — Encounter: Payer: Self-pay | Admitting: Orthopaedic Surgery

## 2018-04-25 ENCOUNTER — Other Ambulatory Visit: Payer: Self-pay

## 2018-04-25 DIAGNOSIS — S72142A Displaced intertrochanteric fracture of left femur, initial encounter for closed fracture: Secondary | ICD-10-CM

## 2018-04-25 DIAGNOSIS — E440 Moderate protein-calorie malnutrition: Secondary | ICD-10-CM

## 2018-04-25 HISTORY — PX: INTRAMEDULLARY (IM) NAIL INTERTROCHANTERIC: SHX5875

## 2018-04-25 LAB — BASIC METABOLIC PANEL
Anion gap: 7 (ref 5–15)
BUN: 13 mg/dL (ref 8–23)
CHLORIDE: 95 mmol/L — AB (ref 98–111)
CO2: 32 mmol/L (ref 22–32)
Calcium: 8.4 mg/dL — ABNORMAL LOW (ref 8.9–10.3)
Creatinine, Ser: 0.72 mg/dL (ref 0.44–1.00)
GFR calc Af Amer: >60 mL/min (ref >60)
GFR calc non Af Amer: >60 mL/min (ref >60)
GLUCOSE: 103 mg/dL — AB (ref 70–99)
POTASSIUM: 3.6 mmol/L (ref 3.5–5.1)
Sodium: 134 mmol/L — ABNORMAL LOW (ref 135–145)

## 2018-04-25 LAB — MAGNESIUM: Magnesium: 2.1 mg/dL (ref 1.7–2.4)

## 2018-04-25 LAB — CBC
HCT: 35.8 % — ABNORMAL LOW (ref 36.0–46.0)
HEMOGLOBIN: 10.8 g/dL — AB (ref 12.0–15.0)
MCH: 27 pg (ref 26.0–34.0)
MCHC: 30.2 g/dL (ref 30.0–36.0)
MCV: 89.5 fL (ref 80.0–100.0)
Platelets: 290 10*3/uL (ref 150–400)
RBC: 4 MIL/uL (ref 3.87–5.11)
RDW: 14.8 % (ref 11.5–15.5)
WBC: 11 10*3/uL — ABNORMAL HIGH (ref 4.0–10.5)
nRBC: 0 % (ref 0.0–0.2)

## 2018-04-25 LAB — APTT: aPTT: 80 seconds — ABNORMAL HIGH (ref 24–36)

## 2018-04-25 SURGERY — FIXATION, FRACTURE, INTERTROCHANTERIC, WITH INTRAMEDULLARY ROD
Anesthesia: General | Site: Hip | Laterality: Left

## 2018-04-25 MED ORDER — SUGAMMADEX SODIUM 200 MG/2ML IV SOLN
INTRAVENOUS | Status: DC | PRN
  Administered 2018-04-25: 100 mg via INTRAVENOUS

## 2018-04-25 MED ORDER — DOCUSATE SODIUM 100 MG PO CAPS: 100.0000 mg | ORAL_CAPSULE | Freq: Two times a day (BID) | ORAL | Status: DC

## 2018-04-25 MED ORDER — 0.9 % SODIUM CHLORIDE (POUR BTL) OPTIME
TOPICAL | Status: DC | PRN
  Administered 2018-04-25: 1000 mL

## 2018-04-25 MED ORDER — ROCURONIUM BROMIDE 50 MG/5ML IV SOSY
PREFILLED_SYRINGE | INTRAVENOUS | Status: AC
  Filled 2018-04-25: qty 5

## 2018-04-25 MED ORDER — LIDOCAINE HCL (CARDIAC) PF 100 MG/5ML IV SOSY
PREFILLED_SYRINGE | INTRAVENOUS | Status: DC | PRN
  Administered 2018-04-25: 50 mg via INTRAVENOUS

## 2018-04-25 MED ORDER — METOCLOPRAMIDE HCL 5 MG/ML IJ SOLN: 5.0000 mg | Freq: Three times a day (TID) | INTRAMUSCULAR | Status: DC | PRN

## 2018-04-25 MED ORDER — LIDOCAINE-EPINEPHRINE 1 %-1:100000 IJ SOLN
INTRAMUSCULAR | Status: AC
  Filled 2018-04-25: qty 1

## 2018-04-25 MED ORDER — ONDANSETRON HCL 4 MG/2ML IJ SOLN: 4.0000 mg | Freq: Four times a day (QID) | INTRAMUSCULAR | Status: DC | PRN

## 2018-04-25 MED ORDER — SODIUM CHLORIDE 0.9 % IV SOLN
INTRAVENOUS | Status: DC | PRN
  Administered 2018-04-25: 40 ug/min via INTRAVENOUS

## 2018-04-25 MED ORDER — FENTANYL CITRATE (PF) 100 MCG/2ML IJ SOLN
INTRAMUSCULAR | Status: DC | PRN
  Administered 2018-04-25: 50 ug via INTRAVENOUS

## 2018-04-25 MED ORDER — ONDANSETRON HCL 4 MG PO TABS: 4.0000 mg | ORAL_TABLET | Freq: Four times a day (QID) | ORAL | Status: DC | PRN

## 2018-04-25 MED ORDER — METOCLOPRAMIDE HCL 5 MG PO TABS
5.0000 mg | ORAL_TABLET | Freq: Three times a day (TID) | ORAL | Status: DC | PRN
  Filled 2018-04-25: qty 2

## 2018-04-25 MED ORDER — PHENYLEPHRINE HCL 10 MG/ML IJ SOLN
INTRAMUSCULAR | Status: DC | PRN
  Administered 2018-04-25: 120 ug via INTRAVENOUS
  Administered 2018-04-25: 200 ug via INTRAVENOUS

## 2018-04-25 MED ORDER — ROCURONIUM BROMIDE 10 MG/ML (PF) SYRINGE
PREFILLED_SYRINGE | INTRAVENOUS | Status: DC | PRN
  Administered 2018-04-25: 50 mg via INTRAVENOUS

## 2018-04-25 MED ORDER — PROPOFOL 10 MG/ML IV BOLUS
INTRAVENOUS | Status: AC
  Filled 2018-04-25: qty 20

## 2018-04-25 MED ORDER — ONDANSETRON HCL 4 MG/2ML IJ SOLN
INTRAMUSCULAR | Status: AC
  Filled 2018-04-25: qty 2

## 2018-04-25 MED ORDER — DEXAMETHASONE SODIUM PHOSPHATE 10 MG/ML IJ SOLN
INTRAMUSCULAR | Status: AC
  Filled 2018-04-25: qty 1

## 2018-04-25 MED ORDER — PHENYLEPHRINE 40 MCG/ML (10ML) SYRINGE FOR IV PUSH (FOR BLOOD PRESSURE SUPPORT)
PREFILLED_SYRINGE | INTRAVENOUS | Status: AC
  Filled 2018-04-25: qty 20

## 2018-04-25 MED ORDER — ONDANSETRON HCL 4 MG/2ML IJ SOLN
INTRAMUSCULAR | Status: DC | PRN
  Administered 2018-04-25: 4 mg via INTRAVENOUS

## 2018-04-25 MED ORDER — DEXAMETHASONE SODIUM PHOSPHATE 10 MG/ML IJ SOLN
INTRAMUSCULAR | Status: DC | PRN
  Administered 2018-04-25: 4 mg via INTRAVENOUS

## 2018-04-25 MED ORDER — PHENOL 1.4 % MT LIQD: 1.0000 | OROMUCOSAL | Status: DC | PRN

## 2018-04-25 MED ORDER — CEFAZOLIN SODIUM-DEXTROSE 2-4 GM/100ML-% IV SOLN
2.0000 g | Freq: Four times a day (QID) | INTRAVENOUS | Status: AC
  Administered 2018-04-25 – 2018-04-26 (×2): 2 g via INTRAVENOUS
  Filled 2018-04-25 (×2): qty 100

## 2018-04-25 MED ORDER — CEFAZOLIN SODIUM-DEXTROSE 2-4 GM/100ML-% IV SOLN
INTRAVENOUS | Status: AC
  Filled 2018-04-25: qty 100

## 2018-04-25 MED ORDER — ALBUTEROL SULFATE (2.5 MG/3ML) 0.083% IN NEBU: 2.5000 mg | INHALATION_SOLUTION | Freq: Three times a day (TID) | RESPIRATORY_TRACT | Status: DC | PRN

## 2018-04-25 MED ORDER — CEFAZOLIN SODIUM-DEXTROSE 2-4 GM/100ML-% IV SOLN
2.0000 g | Freq: Once | INTRAVENOUS | Status: AC
  Administered 2018-04-25: 2 g via INTRAVENOUS

## 2018-04-25 MED ORDER — PROPOFOL 10 MG/ML IV BOLUS
INTRAVENOUS | Status: DC | PRN
  Administered 2018-04-25: 50 mg via INTRAVENOUS

## 2018-04-25 MED ORDER — LACTATED RINGERS IV SOLN
INTRAVENOUS | Status: DC
  Administered 2018-04-25: 13:00:00 via INTRAVENOUS

## 2018-04-25 MED ORDER — FENTANYL CITRATE (PF) 250 MCG/5ML IJ SOLN
INTRAMUSCULAR | Status: AC
  Filled 2018-04-25: qty 5

## 2018-04-25 MED ORDER — MENTHOL 3 MG MT LOZG: 1.0000 | LOZENGE | OROMUCOSAL | Status: DC | PRN

## 2018-04-25 SURGICAL SUPPLY — 44 items
BIT DRILL 4.3MMS DISTAL GRDTED (BIT) IMPLANT
BNDG GAUZE ELAST 4 BULKY (GAUZE/BANDAGES/DRESSINGS) ×2 IMPLANT
COVER PERINEAL POST (MISCELLANEOUS) ×3 IMPLANT
COVER SURGICAL LIGHT HANDLE (MISCELLANEOUS) ×3 IMPLANT
COVER WAND RF STERILE (DRAPES) ×3 IMPLANT
DRAPE HALF SHEET 40X57 (DRAPES) IMPLANT
DRAPE STERI IOBAN 125X83 (DRAPES) ×3 IMPLANT
DRILL 4.3MMS DISTAL GRADUATED (BIT) ×3
DRSG EMULSION OIL 3X3 NADH (GAUZE/BANDAGES/DRESSINGS) ×3 IMPLANT
DRSG MEPILEX BORDER 4X4 (GAUZE/BANDAGES/DRESSINGS) ×3 IMPLANT
DRSG MEPILEX BORDER 4X8 (GAUZE/BANDAGES/DRESSINGS) ×3 IMPLANT
ELECT REM PT RETURN 9FT ADLT (ELECTROSURGICAL) ×3
ELECTRODE REM PT RTRN 9FT ADLT (ELECTROSURGICAL) ×1 IMPLANT
GAUZE SPONGE 4X4 12PLY STRL (GAUZE/BANDAGES/DRESSINGS) ×2 IMPLANT
GAUZE XEROFORM 5X9 LF (GAUZE/BANDAGES/DRESSINGS) ×2 IMPLANT
GLOVE BIOGEL M STRL SZ7.5 (GLOVE) ×3 IMPLANT
GLOVE BIOGEL PI IND STRL 8 (GLOVE) ×1 IMPLANT
GLOVE BIOGEL PI INDICATOR 8 (GLOVE) ×2
GLOVE ECLIPSE 8.0 STRL XLNG CF (GLOVE) ×3 IMPLANT
GOWN STRL REUS W/ TWL LRG LVL3 (GOWN DISPOSABLE) ×3 IMPLANT
GOWN STRL REUS W/TWL LRG LVL3 (GOWN DISPOSABLE) ×9
GUIDEPIN 3.2X17.5 THRD DISP (PIN) ×2 IMPLANT
GUIDEWIRE BALL NOSE 100CM (WIRE) ×2 IMPLANT
HFN LH 130 DEG 11MM X 340MM (Nail) ×2 IMPLANT
HIP FRA NAIL LAG SCREW 10.5X90 (Orthopedic Implant) ×3 IMPLANT
KIT BASIN OR (CUSTOM PROCEDURE TRAY) ×3 IMPLANT
KIT TURNOVER KIT B (KITS) ×3 IMPLANT
LINER BOOT UNIVERSAL DISP (MISCELLANEOUS) ×3 IMPLANT
MANIFOLD NEPTUNE II (INSTRUMENTS) ×3 IMPLANT
NS IRRIG 1000ML POUR BTL (IV SOLUTION) ×3 IMPLANT
PACK GENERAL/GYN (CUSTOM PROCEDURE TRAY) ×3 IMPLANT
PAD ABD 8X10 STRL (GAUZE/BANDAGES/DRESSINGS) ×3 IMPLANT
PAD ARMBOARD 7.5X6 YLW CONV (MISCELLANEOUS) ×6 IMPLANT
SCREW BONE CORTICAL 5.0X36 (Screw) ×2 IMPLANT
SCREW LAG HIP FRA NAIL 10.5X90 (Orthopedic Implant) ×1 IMPLANT
STAPLER VISISTAT 35W (STAPLE) ×3 IMPLANT
SUT VIC AB 0 CT1 27 (SUTURE) ×6
SUT VIC AB 0 CT1 27XBRD ANBCTR (SUTURE) ×2 IMPLANT
SUT VIC AB 2-0 CT1 27 (SUTURE) ×3
SUT VIC AB 2-0 CT1 TAPERPNT 27 (SUTURE) ×1 IMPLANT
TAPE CLOTH SURG 6X10 WHT LF (GAUZE/BANDAGES/DRESSINGS) ×2 IMPLANT
TOWEL OR 17X24 6PK STRL BLUE (TOWEL DISPOSABLE) ×3 IMPLANT
TOWEL OR 17X26 10 PK STRL BLUE (TOWEL DISPOSABLE) ×3 IMPLANT
WATER STERILE IRR 1000ML POUR (IV SOLUTION) ×3 IMPLANT

## 2018-04-25 NOTE — Progress Notes (Signed)
[facility] GIP Visit @ 0830  This is a related and covered GIP admission of 04/23/18 with [facility] diagnosis of COPD per Dr. Konrad Dolores.  Patient has an [facility] DNR.  EMS was activated by [facility], after patient was found to by lying on the floor after a fall complaining of left hip pain and inability to get up.  Facility unable to get her up due to extreme pain and deformity, and notified family and EMS.  Hospice was notified that pt would be going to the ED for evaluation.  Pt was admitted on 04/23/18 for fracture of left hip.  This is day 3 of [facility] GIP.  Patient is scheduled to have an IMN of her left hip on 04/24/18 at 245pm.  Pt receiving IV heparion, IV morphine and hydrocodone for pain.  Visited pt in the room with her daughter Caren Griffins present.  Pt alert, intermittently and mildly confused (baseline per daughter) complaining of hip pain and shaking mildly.  Pt endorses anxiety about upcoming surgery as well as pain.  RN administering pain medication during this time.  Provided support and reassurance about surgery.  Goals of care:  Daughter states she spoke to [facility] and they would be able to take her back and provide rehab if needed.  Daughter is not thinking that she wants rehab at this time.  She wants to use PT in hospital but feels that rehab would not improve her much.  She advises that she was basically mostly wheelchair bound due to her SOB.  Discharge planning:  Ongoing.  Dependent on progression after surgery.  Caren Griffins and her mother did not have any questions related to plan at this time.   IDT updated.  Should ambulance transfer be needed at discharge, please use [facility] as they contract this service for [facility].  Please call with any hospice related questions or concerns.  Thank you, Venia Carbon BSN, [facility] Liaison (listed in [place]) [phone]

## 2018-04-25 NOTE — Progress Notes (Signed)
PATIENT ID:      BRAYLA PAT  MRN:     [id number] DOB/AGE:    1930-07-03 / 84 y.o.       OPERATIVE REPORT    DATE OF PROCEDURE:  04/25/2018       PREOPERATIVE DIAGNOSIS:   Displaced 2 part intertrochanteric fracture left hip                                                       Estimated body mass index is 20.63 kg/m as calculated from the following:   Height as of this encounter: 5\' 5"  (1.651 m).   Weight as of this encounter: 56.2 kg.     POSTOPERATIVE DIAGNOSIS:   same                                                                 Estimated body mass index is 20.63 kg/m as calculated from the following:   Height as of this encounter: 5\' 5"  (1.651 m).   Weight as of this encounter: 56.2 kg.     PROCEDURE:  Procedure(s):closed reduction and  intramedullary nail fixation intertrochanteric fracture left hip     SURGEON:  Joni Fears, MD    ASSISTANT:   Biagio Borg, PA-C   (Present and scrubbed throughout the case, critical for assistance with exposure, retraction, instrumentation, and closure.)          ANESTHESIA: general     DRAINS: none :      TOURNIQUET TIME: * No tourniquets in log *    COMPLICATIONS: several fragile skin avulsions  CONDITION:  stable  PROCEDURE IN [id number]  Garald Balding 04/25/2018, 4:18 PM  Patient ID: Eustace Moore, female   DOB: 29-Nov-1930, 83 y.o.   MRN: [id number]

## 2018-04-25 NOTE — Progress Notes (Signed)
Per CRNA skin tear on left upper leg and skin tear on right forearm occurring in the OR, both dressed with kerlix. Dressings clean, dry, and intact.

## 2018-04-25 NOTE — Anesthesia Procedure Notes (Signed)
Procedure Name: Intubation Date/Time: 04/25/2018 2:34 PM Performed by: Carney Living, CRNA Pre-anesthesia Checklist: Patient identified, Emergency Drugs available, Suction available, Patient being monitored and Timeout performed Patient Re-evaluated:Patient Re-evaluated prior to induction Oxygen Delivery Method: Circle system utilized Preoxygenation: Pre-oxygenation with 100% oxygen Induction Type: IV induction Ventilation: Mask ventilation without difficulty Laryngoscope Size: Mac and 4 Grade View: Grade I Tube type: Oral Tube size: 7.0 mm Number of attempts: 1 Airway Equipment and Method: Stylet Placement Confirmation: ETT inserted through vocal cords under direct vision,  positive ETCO2 and breath sounds checked- equal and bilateral Secured at: 22 cm Tube secured with: Tape Dental Injury: Teeth and Oropharynx as per pre-operative assessment

## 2018-04-25 NOTE — Progress Notes (Signed)
ANTICOAGULATION CONSULT NOTE - Follow Up Consult  Pharmacy Consult for Heparin Indication: afib and recent DVT  No Known Allergies  Patient Measurements: Height: 5\' 5"  (165.1 cm) Weight: 124 lb (56.2 kg) IBW/kg (Calculated) : 57 Heparin Dosing Weight:  56.2 kg  Vital Signs: Temp: 98.4 F (36.9 C) (11/06 0604) Temp Source: Oral (11/06 0604) BP: 106/64 (11/06 0604) Pulse Rate: 79 (11/06 0604)  Labs: Recent Labs    04/23/18 8159  04/23/18 1212 04/24/18 0324 04/24/18 0726 04/24/18 1352 04/25/18 0341  HGB 12.3  --   --  10.6*  --   --  10.8*  HCT 40.7  --   --  36.6  --   --  35.8*  PLT 379  --   --  324  --   --  290  APTT  --    < > 45* 52*  --  94* 80*  HEPARINUNFRC  --   --  >2.20*  --   --   --   --   CREATININE 1.20*  --   --   --  0.85  --  0.72   < > = values in this interval not displayed.    Estimated Creatinine Clearance: 45.6 mL/min (by C-G formula based on SCr of 0.72 mg/dL).  Assessment:  Anticoag: Xarelto PTA for AFib (chads vascular 5-) and DVT (8/19)> heparin. LD 11/3. Hgb 12.3>10.8. Plts 954 019 3592.  11/5:PM note from Rx: Hgb down and bleeding from wound but not through dressings,  APTT 80 now in goal range. No HL done.  Goal of Therapy:  aPTT 66-102 seconds Monitor platelets by anticoagulation protocol: Yes   Plan:  - Heparin infusion at 850 -Daily aPTT, CBC -Continue checking aPTT until HL and aPTT correlate, daily CBC Hip surgey 11/6   Loui Massenburg S. Alford Highland, PharmD, BCPS Clinical Staff Pharmacist [facility] 04/25/2018,10:32 AM

## 2018-04-25 NOTE — Transfer of Care (Signed)
Immediate Anesthesia Transfer of Care Note  Patient: [name]  Procedure(s) Performed: intramedullary nail intertrochanteric left hip (Left Hip)  Patient Location: PACU  Anesthesia Type:General  Level of Consciousness: awake, alert , oriented and patient cooperative  Airway & Oxygen Therapy: Patient Spontanous Breathing and Patient connected to nasal cannula oxygen  Post-op Assessment: Report given to RN, Post -op Vital signs reviewed and stable and Patient moving all extremities X 4  Post vital signs: Reviewed and stable  Last Vitals:  Vitals Value Taken Time  BP 133/61 04/25/2018  4:28 PM  Temp    Pulse 69 04/25/2018  4:36 PM  Resp 14 04/25/2018  4:36 PM  SpO2 95 % 04/25/2018  4:36 PM  Vitals shown include unvalidated device data.  Last Pain:  Vitals:   04/25/18 0604  TempSrc: Oral  PainSc:          Complications: No apparent anesthesia complications

## 2018-04-25 NOTE — Anesthesia Postprocedure Evaluation (Signed)
Anesthesia Post Note  Patient: [name]  Procedure(s) Performed: intramedullary nail intertrochanteric left hip (Left Hip)     Patient location during evaluation: PACU Anesthesia Type: General Level of consciousness: awake and alert Pain management: pain level controlled Vital Signs Assessment: post-procedure vital signs reviewed and stable Respiratory status: spontaneous breathing, nonlabored ventilation, respiratory function stable and patient connected to nasal cannula oxygen Cardiovascular status: blood pressure returned to baseline and stable Postop Assessment: no apparent nausea or vomiting Anesthetic complications: no    Last Vitals:  Vitals:   04/25/18 1729 04/25/18 1744  BP: 129/67 137/72  Pulse: 75 72  Resp: 16 16  Temp: 36.6 C 36.5 C  SpO2: 98% 98%    Last Pain:  Vitals:   04/25/18 1729  TempSrc:   PainSc: 0-No pain                 [place] L Ulysses Alper

## 2018-04-25 NOTE — Anesthesia Preprocedure Evaluation (Addendum)
Anesthesia Evaluation  Patient identified by MRN, date of birth, ID band Patient awake    Reviewed: Allergy & Precautions, NPO status , Patient's Chart, lab work & pertinent test results, reviewed documented beta blocker date and time   Airway Mallampati: II  TM Distance: >3 FB Neck ROM: Full  Mouth opening: Limited Mouth Opening  Dental no notable dental hx. (+) Edentulous Upper, Edentulous Lower, Dental Advisory Given   Pulmonary COPD (on 4L),  oxygen dependent, former smoker,    Pulmonary exam normal breath sounds clear to auscultation       Cardiovascular hypertension, Pt. on medications +CHF and + DVT (on xarelto)  Normal cardiovascular exam+ dysrhythmias (SSS and CHB s/p PPM) Atrial Fibrillation + pacemaker  Rhythm:Regular Rate:Normal  TTE 2018 - Left ventricle: The cavity size was normal. Wall thickness was increased in a pattern of mild LVH. Systolic function was mildly reduced. The estimated ejection fraction was in the range of 45% to 50%. There is hypokinesis of the apical myocardium. The study is not technically sufficient to allow evaluation of LV diastolic function. - Aortic valve: There was mild regurgitation. - Mitral valve: There was mild regurgitation. - Left atrium: The atrium was mildly dilated. - Right ventricle: The cavity size was mildly dilated. - Right atrium: The atrium was mildly dilated. - Pulmonary arteries: Systolic pressure was mildly increased. PA peak pressure: 33 mm Hg (S). - Pericardium, extracardiac: A trivial pericardial effusion was identified. Impressions: - Apical hypokinesis with mildly reduced LV systolic function; mild LVH; sclerotic aortic valve with mild AI; mild MR; mild biatrial enlargement; mild RVE; mild TR; mildly elevated pulmonary pressure.   Neuro/Psych PSYCHIATRIC DISORDERS Anxiety negative neurological ROS     GI/Hepatic negative GI ROS, Neg liver ROS,   Endo/Other  negative  endocrine ROS  Renal/GU negative Renal ROS  negative genitourinary   Musculoskeletal negative musculoskeletal ROS (+)   Abdominal   Peds negative pediatric ROS (+)  Hematology  (+) Blood dyscrasia, anemia ,   Anesthesia Other Findings Last dose of xarelto Sunday (11/3) evening  Discharged 3 weeks ago for COPD and CHF exacerbation  Spoke with pt and family, DNR reversed peri-operatively  Reproductive/Obstetrics negative OB ROS                        Anesthesia Physical Anesthesia Plan  ASA: III  Anesthesia Plan: General   Post-op Pain Management:    Induction: Intravenous  PONV Risk Score and Plan: 3 and Ondansetron, Dexamethasone and Treatment may vary due to age or medical condition  Airway Management Planned: Oral ETT  Additional Equipment: Arterial line  Intra-op Plan:   Post-operative Plan: Extubation in OR and Possible Post-op intubation/ventilation  Informed Consent: I have reviewed the patients History and Physical, chart, labs and discussed the procedure including the risks, benefits and alternatives for the proposed anesthesia with the patient or authorized representative who has indicated his/her understanding and acceptance.   Dental advisory given  Plan Discussed with: CRNA, Anesthesiologist and Surgeon  Anesthesia Plan Comments:        Anesthesia Quick Evaluation

## 2018-04-25 NOTE — Op Note (Signed)
NAME: [name], KORF MEDICAL RECORD FX:[id number] ACCOUNT [id number] DATE OF BIRTH:1930-09-16 FACILITY: [facility] LOCATION: [facility] PHYSICIAN:Lillard Bailon Sharlotte Alamo, MD  OPERATIVE REPORT  DATE OF PROCEDURE:  04/25/2018  PREOPERATIVE DIAGNOSIS:  Displaced 2 part intertrochanteric fracture, left hip.  POSTOPERATIVE DIAGNOSIS:  Displaced 2 part intertrochanteric fracture, left hip.  PROCEDURE:  Closed reduction and intramedullary nail fixation intertrochanteric fracture, left hip.  SURGEON:  Joni Fears, MD  ASSISTANT:  Biagio Borg PA-C.  ANESTHESIA:  General    COMPLICATIONS:  Several skin avulsions secondary to fragile skin.  DEVICE: Biomet 34 mm long x 11 mm wide intramedullary nail, 34 mm in length with a 90 mm compression screw and a single distal 4.5 mm transverse fixation screw.  DESCRIPTION OF PROCEDURE:  The patient was met in the holding area with her daughter.  Any questions were answered.  I marked the left hip as the appropriate operative site.  She was then transported to room #5 and placed under general anesthesia without  difficulty while on the operating room bed.  She was then carefully transported to the fracture table.  The right lower extremity was placed carefully in 90/90 and well padded.  A closed reduction of left hip fracture was performed with distal traction.   Image intensification revealed anatomic position of the 2 part intertrochanteric fracture.  The left hip and thigh were then prepped with chlorhexidine scrub and DuraPrep x2 from just above the iliac crest to the knee.  Sterile draping was performed.  Timeout was called.  Using the image intensifier, I localized our skin incision proximal to the greater trochanter tip.  About an inch and a half to 2 inch incision was then made sharply and carried down to subcutaneous tissue.  Deep fascia was incised.  The IT band was also  evaluated and incised.  Any bleeding was controlled with the Bovie.  The  intertrochanteric nail guide was then placed over the tip of the greater trochanter and checked in both AP and lateral projections.  We then inserted the guide pin through the tip of the trochanter to the level of the lesser trochanter.  We checked  it again with image intensification in 2 projections.  This was removed and then we inserted the long guide pin through the center of the femoral canal to the metaphyseal region of the distal femur.  We again checked to be sure it was in appropriate position by AP and lateral projection.  Reaming was then performed sequentially from 9 to 12-1/2 mm to accept an 11 mm nail.  We measured the nail length at 34 mm.  With the guide pin in place, we then carefully inserted the Biomet titanium nail, 11 mm in diameter, 34 cm in length.  We made  sure it was in appropriate position in both AP and lateral projections.  The external guide to the nail was applied to aid with insertion.  A second incision was then made along the lateral thigh for insertion of the compression screw.  Under image  intensification, we reinserted the guide pin through the lateral femur into the femoral neck and head in its central position.  Again, we checked on both AP and lateral projections and felt we were in anatomic position.  Reaming was then performed over the guide pin and again checked with image intensification.  We measured a 90 mm screw.  This was carefully then threaded over the guide mechanism into the femoral neck and head and was flush with the lateral femoral  cortex.  We then inserted the tightening screw proximally.  The external guide mechanism was removed and we then checked the films in the AP and lateral projection.  We thought we had an anatomic position.  We elected to insert a single distal transfixion screw.  Under image intensification, the screw was measured at 36 mm and then was drilled through the center of the distal screw and checked with image  intensification.  All the wounds were then copiously irrigated with saline solution.  We did notice several skin avulsions on removing the drapes.  There were also several skin avulsions while Anesthesia was applying padding to both of her upper extremities.  These were  covered with Xeroform gauze and a soft bulky dressing.  Dressings were also applied to the wounds, which were closed in several layers with Vicryl and then skin clips.  Traction was removed.  The patient's legs were then carefully placed on the fracture table.  Leg lengths appeared to be symmetrical.    She was carefully transported to the operating room bed and then to the recovery room without further problems.  AN/NUANCE  D:04/25/2018 T:04/25/2018 JOB:[id number]

## 2018-04-25 NOTE — Progress Notes (Signed)
MD questioned about heparin gtt during surgery. Stated to turn heparin off at this time. Orders received and carried out.

## 2018-04-25 NOTE — OR Nursing (Signed)
Patient developed a rather large skin tear from blood pressure cuff on left upper extremity while in OR.  CRNA and Anesthesiologist covered with dressings.

## 2018-04-25 NOTE — Progress Notes (Signed)
The recent History & Physical has been reviewed. I have personally examined the patient today. There is no interval change to the documented History & Physical. The patient would like to proceed with the procedure.  [name] 04/25/2018,  2:08 PM  Patient ID: [name], female   DOB: 07-Aug-1930, 84 y.o.   MRN: [id number]

## 2018-04-25 NOTE — Progress Notes (Signed)
PROGRESS NOTE        PATIENT DETAILS Name: [name] Age: 84 y.o. Sex: female Date of Birth: 07-10-1930 Admit Date: 04/23/2018 Admitting Physician Karmen Bongo, MD AYT:KZSWFUXNA, Christiane Ha, MD  Brief Narrative: Patient is a 84 y.o. female with a history of venous thromboembolism (DVT) on Xarelto, atrial fibrillation, COPD on home O2, pacemaker implantation-followed by hospice at home-presenting to the hospital on 11/4 following a mechanical fall, she was found to have a left hip fracture.  She was subsequently admitted to the hospitalist service-orthopedics planning on left hip repair on 11/6.  See below for further details.  Subjective: No shortness of breath-anxious about upcoming hip repair today.  No chest pain.  Assessment/Plan: Mechanical fall resulting in a left hip fracture: Orthopedics following with plans for surgical repair 11/6.  Given advanced age, severe COPD on home O2 and frailty-remains a high risk candidate for proposed surgical procedure, however both patient and family are accepting all risks and are willing to proceed.  Patient is followed by hospice at home for severe COPD.  DNR remains in place.  Family contemplating going back to ALF with hospice follow-up rather than SNF after hip repair-however would want to wait for a formal PT evaluation before deciding.    Recent lower extremity DVT: Per daughter-this was found at ALF sometime in August-continue to hold Xarelto.  On IV heparin per pharmacy.  Once okay with orthopedic-we will resume full dose anticoagulation postoperatively.    Persistent AF: Paced rhythm-reviewed most recent outpatient cardiology note-although she has a chadsvasc score 5-given advanced age and risk of falls-not considered a good candidate for anticoagulation.  However she is back on Xarelto for the past few months as she was found to have a DVT.  Sick sinus syndrome: Per outpatient cardiology note-she is 100% pacemaker  dependent.  Follow.  Chronic diastolic heart failure: Remains euvolemic-watch closely while on IV fluids.    Severe COPD chronic hypoxic respiratory failure on home O2: Appears stable-lungs are clear at this point-continue bronchodilators.  She appears to have severe COPD and is on home hospice.    Polymyalgia rheumatica: Appears stable-continue prednisone  Dementia/depression: Stable-continue Lexapro.    Left leg abrasion/skin breakdown: Large area of skin breakdown in the left upper leg/lower thigh-appreciate wound care evaluation.  This was secondary to a mechanical fall.    Non-severe (moderate) malnutrition in context of chronic illness: Continue supplements  DVT Prophylaxis: Full dose anticoagulation with Heparin  Code Status: DNR  Family Communication: Daughter at bedside  Disposition Plan: Remain inpatient-probably SNF on discharge  Antimicrobial agents: Anti-infectives (From admission, onward)   None      Procedures: None  CONSULTS:  orthopedic surgery  Time spent: 25- minutes-Greater than 50% of this time was spent in counseling, explanation of diagnosis, planning of further management, and coordination of care.  MEDICATIONS: Scheduled Meds: . ALPRAZolam  0.25 mg Oral QHS  . arformoterol  15 mcg Nebulization BID  . budesonide (PULMICORT) nebulizer solution  0.25 mg Nebulization BID  . Chlorhexidine Gluconate Cloth  6 each Topical Once  . docusate sodium  100 mg Oral BID  . escitalopram  10 mg Oral Daily  . feeding supplement (ENSURE ENLIVE)  237 mL Oral BID BM  . metoprolol succinate  25 mg Oral Daily  . predniSONE  10 mg Oral Q breakfast  . umeclidinium bromide  1 puff Inhalation Daily   Continuous Infusions: . heparin 850 Units/hr (04/25/18 0234)  . methocarbamol (ROBAXIN) IV     PRN Meds:.guaiFENesin-dextromethorphan, HYDROcodone-acetaminophen, ipratropium-albuterol, LORazepam, methocarbamol **OR** methocarbamol (ROBAXIN) IV, morphine injection,  morphine CONCENTRATE, senna-docusate   PHYSICAL EXAM: Vital signs: Vitals:   04/24/18 2020 04/25/18 0028 04/25/18 0604 04/25/18 0849  BP: (!) 95/51 110/60 106/64   Pulse: 74 76 79   Resp:      Temp: 98.5 F (36.9 C) 98.6 F (37 C) 98.4 F (36.9 C)   TempSrc:  Oral Oral   SpO2: 95% 96% 94% 94%  Weight:      Height:       Filed Weights   04/23/18 0639  Weight: 56.2 kg   Body mass index is 20.63 kg/m.   General appearance:Awake, alert, not in any distress.  Eyes:no scleral icterus. HEENT: Atraumatic and Normocephalic Neck: supple, no JVD. Resp:Good air entry bilaterally,no rales or rhonchi CVS: S1 S2 regular, no murmurs.  GI: Bowel sounds present, Non tender and not distended with no gaurding, rigidity or rebound. Extremities: B/L Lower Ext shows no edema, both legs are warm to touch Neurology:  Non focal Musculoskeletal:No digital cyanosis Skin:No Rash, warm and dry Wounds:N/A  I have personally reviewed following labs and imaging studies  LABORATORY DATA: CBC: Recent Labs  Lab 04/23/18 0648 04/24/18 0324 04/25/18 0341  WBC 12.4* 9.9 11.0*  HGB 12.3 10.6* 10.8*  HCT 40.7 36.6 35.8*  MCV 90.0 90.6 89.5  PLT 379 324 409    Basic Metabolic Panel: Recent Labs  Lab 04/23/18 0648 04/24/18 0324 04/24/18 0726 04/25/18 0341  NA 135  --  131* 134*  K 2.9*  --  3.5 3.6  CL 94*  --  92* 95*  CO2 35*  --  31 32  GLUCOSE 110*  --  106* 103*  BUN 15  --  16 13  CREATININE 1.20*  --  0.85 0.72  CALCIUM 8.8*  --  8.2* 8.4*  MG  --  1.7  --  2.1    GFR: Estimated Creatinine Clearance: 45.6 mL/min (by C-G formula based on SCr of 0.72 mg/dL).  Liver Function Tests: No results for input(s): AST, ALT, ALKPHOS, BILITOT, PROT, ALBUMIN in the last 168 hours. No results for input(s): LIPASE, AMYLASE in the last 168 hours. No results for input(s): AMMONIA in the last 168 hours.  Coagulation Profile: No results for input(s): INR, PROTIME in the last 168  hours.  Cardiac Enzymes: No results for input(s): CKTOTAL, CKMB, CKMBINDEX, TROPONINI in the last 168 hours.  BNP (last 3 results) No results for input(s): PROBNP in the last 8760 hours.  HbA1C: No results for input(s): HGBA1C in the last 72 hours.  CBG: No results for input(s): GLUCAP in the last 168 hours.  Lipid Profile: No results for input(s): CHOL, HDL, LDLCALC, TRIG, CHOLHDL, LDLDIRECT in the last 72 hours.  Thyroid Function Tests: No results for input(s): TSH, T4TOTAL, FREET4, T3FREE, THYROIDAB in the last 72 hours.  Anemia Panel: No results for input(s): VITAMINB12, FOLATE, FERRITIN, TIBC, IRON, RETICCTPCT in the last 72 hours.  Urine analysis:    Component Value Date/Time   COLORURINE YELLOW 03/17/2018 [place] 03/17/2018 1252   LABSPEC 1.011 03/17/2018 1252   PHURINE 5.0 03/17/2018 1252   GLUCOSEU NEGATIVE 03/17/2018 1252   HGBUR SMALL (A) 03/17/2018 1252   BILIRUBINUR NEGATIVE 03/17/2018 1252   KETONESUR 5 (A) 03/17/2018 1252   PROTEINUR NEGATIVE 03/17/2018 1252  UROBILINOGEN 1.0 01/19/2011 1030   NITRITE NEGATIVE 03/17/2018 1252   LEUKOCYTESUR NEGATIVE 03/17/2018 1252    Sepsis Labs: Lactic Acid, Venous    Component Value Date/Time   LATICACIDVEN 1.76 03/26/2018 0825    MICROBIOLOGY: Recent Results (from the past 240 hour(s))  Surgical pcr screen     Status: None   Collection Time: 04/23/18  9:42 PM  Result Value Ref Range Status   MRSA, PCR NEGATIVE NEGATIVE Final   Staphylococcus aureus NEGATIVE NEGATIVE Final    Comment: (NOTE) The Xpert SA Assay (FDA approved for NASAL specimens in patients 90 years of age and older), is one component of a comprehensive surveillance program. It is not intended to diagnose infection nor to guide or monitor treatment. Performed at [facility], [place] [address], [place], [place] [address]     RADIOLOGY STUDIES/RESULTS: Dg Pelvis 1-2 Views  Result Date: 04/23/2018 CLINICAL DATA:   Fall EXAM: PELVIS - 1-2 VIEW COMPARISON:  01/13/2011 FINDINGS: There is a new comminuted intertrochanteric proximal left femur fracture with varus deformity. There is an intramedullary rod and dynamic compression screw transfixing the previously visualized right intertrochanteric femur fracture. There is no breakage or loosening of the hardware. Osteopenia. Degenerative changes in the lower lumbar spine. IMPRESSION: New comminuted intertrochanteric proximal left femur fracture. Critical Value/emergent results were called by telephone at the time of interpretation on 04/23/2018 at 8:00 am to Pilar Plate, RN, who verbally acknowledged these results. Electronically Signed   By: Marybelle Killings M.D.   On: 04/23/2018 08:00   Ct Head Wo Contrast  Result Date: 04/23/2018 CLINICAL DATA:  Fall today.  No loss of consciousness. EXAM: CT HEAD WITHOUT CONTRAST CT CERVICAL SPINE WITHOUT CONTRAST TECHNIQUE: Multidetector CT imaging of the head and cervical spine was performed following the standard protocol without intravenous contrast. Multiplanar CT image reconstructions of the cervical spine were also generated. COMPARISON:  None. FINDINGS: CT HEAD FINDINGS Brain: Mild chronic ischemic white matter disease is noted. No mass effect or midline shift is noted. Ventricular size is within normal limits. There is no evidence of mass lesion, hemorrhage or acute infarction. Vascular: No hyperdense vessel or unexpected calcification. Skull: Normal. Negative for fracture or focal lesion. Sinuses/Orbits: No acute finding. Other: None. CT CERVICAL SPINE FINDINGS Alignment: Normal. Skull base and vertebrae: No acute fracture. No primary bone lesion or focal pathologic process. Soft tissues and spinal canal: No prevertebral fluid or swelling. No visible canal hematoma. Disc levels: Mild degenerative disc disease is noted at C3-4 C4-5 and C5-6. Upper chest: Probable scarring is noted in the right upper lobe. Other: Mild degenerative changes  are seen involving posterior facet joints bilaterally. IMPRESSION: Mild chronic ischemic white matter disease. No acute intracranial abnormality seen. Mild multilevel degenerative disc disease. No acute abnormality seen in the cervical spine. Electronically Signed   By: Marijo Conception, M.D.   On: 04/23/2018 08:28   Ct Cervical Spine Wo Contrast  Result Date: 04/23/2018 CLINICAL DATA:  Fall today.  No loss of consciousness. EXAM: CT HEAD WITHOUT CONTRAST CT CERVICAL SPINE WITHOUT CONTRAST TECHNIQUE: Multidetector CT imaging of the head and cervical spine was performed following the standard protocol without intravenous contrast. Multiplanar CT image reconstructions of the cervical spine were also generated. COMPARISON:  None. FINDINGS: CT HEAD FINDINGS Brain: Mild chronic ischemic white matter disease is noted. No mass effect or midline shift is noted. Ventricular size is within normal limits. There is no evidence of mass lesion, hemorrhage or acute infarction. Vascular:  No hyperdense vessel or unexpected calcification. Skull: Normal. Negative for fracture or focal lesion. Sinuses/Orbits: No acute finding. Other: None. CT CERVICAL SPINE FINDINGS Alignment: Normal. Skull base and vertebrae: No acute fracture. No primary bone lesion or focal pathologic process. Soft tissues and spinal canal: No prevertebral fluid or swelling. No visible canal hematoma. Disc levels: Mild degenerative disc disease is noted at C3-4 C4-5 and C5-6. Upper chest: Probable scarring is noted in the right upper lobe. Other: Mild degenerative changes are seen involving posterior facet joints bilaterally. IMPRESSION: Mild chronic ischemic white matter disease. No acute intracranial abnormality seen. Mild multilevel degenerative disc disease. No acute abnormality seen in the cervical spine. Electronically Signed   By: Marijo Conception, M.D.   On: 04/23/2018 08:28   Dg Femur Min 2 Views Left  Result Date: 04/23/2018 CLINICAL DATA:  [place] this  morning.  Left hip and femur pain. EXAM: LEFT FEMUR 2 VIEWS COMPARISON:  12/28/2016 and 04/23/2018 FINDINGS: Displaced left femoral intertrochanteric fracture. Visualized left pelvic bone structures are intact. Left hip is located. Cortical thickening in the mid and distal left femur is suggestive for old injury. Left knee is located with joint space narrowing and degenerative changes. Joint space narrowing and degenerative disease in the medial and inferior left hip joint. IMPRESSION: Displaced left femoral intertrochanteric fracture. Degenerative changes in the left hip and left knee. Electronically Signed   By: Markus Daft M.D.   On: 04/23/2018 08:02     LOS: 2 days   Oren Binet, MD  [facility]  If 7PM-7AM, please contact night-coverage  Please page via [email or web address] [email or web address]-click on MD name and type text message  04/25/2018, 10:55 AM

## 2018-04-26 ENCOUNTER — Encounter (HOSPITAL_COMMUNITY): Payer: Self-pay | Admitting: Orthopaedic Surgery

## 2018-04-26 LAB — BASIC METABOLIC PANEL
Anion gap: 9 (ref 5–15)
BUN: 12 mg/dL (ref 8–23)
CALCIUM: 8.4 mg/dL — AB (ref 8.9–10.3)
CO2: 31 mmol/L (ref 22–32)
CREATININE: 0.73 mg/dL (ref 0.44–1.00)
Chloride: 97 mmol/L — ABNORMAL LOW (ref 98–111)
GFR calc non Af Amer: >60 mL/min (ref >60)
Glucose, Bld: 132 mg/dL — ABNORMAL HIGH (ref 70–99)
Potassium: 3.7 mmol/L (ref 3.5–5.1)
SODIUM: 137 mmol/L (ref 135–145)

## 2018-04-26 LAB — CBC
HCT: 33.2 % — ABNORMAL LOW (ref 36.0–46.0)
Hemoglobin: 9.8 g/dL — ABNORMAL LOW (ref 12.0–15.0)
MCH: 26.6 pg (ref 26.0–34.0)
MCHC: 29.5 g/dL — ABNORMAL LOW (ref 30.0–36.0)
MCV: 90 fL (ref 80.0–100.0)
NRBC: 0 % (ref 0.0–0.2)
PLATELETS: 318 10*3/uL (ref 150–400)
RBC: 3.69 MIL/uL — ABNORMAL LOW (ref 3.87–5.11)
RDW: 14.8 % (ref 11.5–15.5)
WBC: 11.4 10*3/uL — AB (ref 4.0–10.5)

## 2018-04-26 LAB — APTT: APTT: 39 s — AB (ref 24–36)

## 2018-04-26 LAB — HEPARIN LEVEL (UNFRACTIONATED): Heparin Unfractionated: 1.01 IU/mL — ABNORMAL HIGH (ref 0.30–0.70)

## 2018-04-26 MED ORDER — ORAL CARE MOUTH RINSE
15.0000 mL | Freq: Two times a day (BID) | OROMUCOSAL | Status: DC
  Administered 2018-04-26 – 2018-04-27 (×2): 15 mL via OROMUCOSAL

## 2018-04-26 MED ORDER — RIVAROXABAN 10 MG PO TABS: 10.0000 mg | ORAL_TABLET | Freq: Every day | ORAL | Status: DC

## 2018-04-26 MED ORDER — RIVAROXABAN 15 MG PO TABS
15.0000 mg | ORAL_TABLET | Freq: Every day | ORAL | Status: DC
  Administered 2018-04-26: 15 mg via ORAL
  Filled 2018-04-26: qty 1

## 2018-04-26 MED ORDER — RIVAROXABAN 20 MG PO TABS: 20.0000 mg | ORAL_TABLET | Freq: Every day | ORAL | Status: DC

## 2018-04-26 NOTE — Evaluation (Signed)
Occupational Therapy Evaluation Patient Details Name: [name] MRN: [id number] DOB: 12-22-1930 Today's Date: 04/26/2018    History of Present Illness Patient is an 84 y/o female presenting to the ED on 04/23/18 s/p fall with resultant L hip fracture. IM nail fixation on 04/25/18 with current WBAT. PMH significant for HTN; PMR; afib on Xarelto; dementia; pacemaker placement; COPD on 3L home O2; and CHF.    Clinical Impression   PTA, pt was living at [facility] and received assistance for ADLs and used RW for transfers and short distance mobility to bathroom. Pt currently requiring Max A for LB ADLs and Max A +2 for functional transfers. Pt presenting with decreased strength, balance and activity tolerance. VSS on 4L O2. Pt would benefit from further acute OT to facilitate safe dc. Pending level of support, recommend dc to [facility] with HHOT for further OT to optimize safety, independence with ADLs, and return to PLOF.      Follow Up Recommendations  Home health OT;Supervision/Assistance - 24 hour;Other (comment)(Return to [facility] if able to provide increased support/assistance)    Equipment Recommendations  3 in 1 bedside commode    Recommendations for Other Services PT consult     Precautions / Restrictions Precautions Precautions: Fall Restrictions Weight Bearing Restrictions: Yes LLE Weight Bearing: Weight bearing as tolerated      Mobility Bed Mobility Overal bed mobility: Needs Assistance Bed Mobility: Supine to Sit     Supine to sit: Mod assist;+2 for physical assistance     General bed mobility comments: patient able to initiate LE towards EOB; requires physical assist for hip/trunk righting  Transfers Overall transfer level: Needs assistance Equipment used: 2 person hand held assist Transfers: Sit to/from [employer] Transfers Sit to Stand: Mod assist;+2 physical assistance Stand pivot transfers: Max assist;+2 physical assistance       General transfer comment: Mod A +2 to power up at bedside - able to reamin static standing with B UT support at PT/OT - little weight shift to L LE with pivot transfer requiring Max A due to limited weight shifting    Balance Overall balance assessment: Needs assistance Sitting-balance support: Bilateral upper extremity supported;Feet supported Sitting balance-Leahy Scale: Fair     Standing balance support: Bilateral upper extremity supported Standing balance-Leahy Scale: Poor Standing balance comment: reliant on external support                           ADL either performed or assessed with clinical judgement   ADL Overall ADL's : Needs assistance/impaired Eating/Feeding: Independent;Sitting   Grooming: Supervision/safety;Set up;Sitting   Upper Body Bathing: Minimal assistance;Sitting   Lower Body Bathing: Maximal assistance;+2 for physical assistance;Sit to/from stand   Upper Body Dressing : Minimal assistance;Sitting   Lower Body Dressing: Maximal assistance;+2 for physical assistance;Sit to/from stand   Toilet Transfer: Maximal assistance;+2 for physical assistance;Stand-pivot(simulated to recliner)           Functional mobility during ADLs: Maximal assistance;+2 for physical assistance(stand pivot only) General ADL Comments: Pt with decreased strength, balance, and activity tolerance.     Vision         Perception     Praxis      Pertinent Vitals/Pain Pain Assessment: 0-10 Pain Score: 5  Pain Location: Left hip Pain Descriptors / Indicators: Grimacing;Discomfort Pain Intervention(s): Monitored during session;Limited activity within patient's tolerance;Repositioned     Hand Dominance Right   Extremity/Trunk Assessment Upper Extremity Assessment  Upper Extremity Assessment: Overall WFL for tasks assessed   Lower Extremity Assessment Lower Extremity Assessment: Defer to PT evaluation RLE Deficits / Details: able to lift LE against  gravity LLE Deficits / Details: unable to lift against gravity, able to slide to EOB   Cervical / Trunk Assessment Cervical / Trunk Assessment: Kyphotic   Communication Communication Communication: No difficulties   Cognition Arousal/Alertness: Awake/alert Behavior During Therapy: WFL for tasks assessed/performed Overall Cognitive Status: Within Functional Limits for tasks assessed                                     General Comments  multiple skin tears; daughter present and very supportive    Exercises     Shoulder Instructions      Home Living Family/patient expects to be discharged to:: Assisted living([facility])                             Home Equipment: Walker - 2 wheels;Wheelchair - manual(supplemental O2 - 4L)   Additional Comments: has single apartment; can have meals delivered to room      Prior Functioning/Environment Level of Independence: Independent with assistive device(s);Needs assistance  Gait / Transfers Assistance Needed: uses RW for short distance in apartment; otherwise uses w/c for primary means of mobility ADL's / Homemaking Assistance Needed: has assist for dressing and bathing; taking sponge baths recently   Comments: currently under hospice care        OT Problem List: Decreased strength;Decreased range of motion;Decreased activity tolerance;Impaired balance (sitting and/or standing);Decreased safety awareness;Decreased knowledge of precautions;Decreased knowledge of use of DME or AE;Pain      OT Treatment/Interventions: Self-care/ADL training;Therapeutic exercise;Energy conservation;DME and/or AE instruction;Therapeutic activities;Patient/family education    OT Goals(Current goals can be found in the care plan section) Acute Rehab OT Goals Patient Stated Goal: return to [facility] OT Goal Formulation: With patient/family Time For Goal Achievement: 05/10/18 Potential to Achieve Goals: Good  OT Frequency: Min  2X/week   Barriers to D/C:            Co-evaluation PT/OT/SLP Co-Evaluation/Treatment: Yes Reason for Co-Treatment: For patient/therapist safety;To address functional/ADL transfers   OT goals addressed during session: ADL's and self-care      AM-PAC PT "6 Clicks" Daily Activity     Outcome Measure Help from another person eating meals?: None Help from another person taking care of personal grooming?: A Little Help from another person toileting, which includes using toliet, bedpan, or urinal?: A Lot Help from another person bathing (including washing, rinsing, drying)?: A Lot Help from another person to put on and taking off regular upper body clothing?: A Little Help from another person to put on and taking off regular lower body clothing?: A Lot 6 Click Score: 16   End of Session Equipment Utilized During Treatment: Gait belt Nurse Communication: Mobility status;Precautions;Weight bearing status  Activity Tolerance: Patient limited by pain;Patient limited by fatigue Patient left: in chair;with call bell/phone within reach;with chair alarm set;with family/visitor present  OT Visit Diagnosis: Unsteadiness on feet (R26.81);Other abnormalities of gait and mobility (R26.89);Muscle weakness (generalized) (M62.81);Pain Pain - Right/Left: Left Pain - part of body: Hip                Time: 3976-7341 OT Time Calculation (min): 28 min Charges:  OT General Charges $OT Visit: 1 Visit OT Evaluation $OT Eval Moderate Complexity:  [place], OTR/L Acute Rehab Pager: [phone] Office: [place] 04/26/2018, 1:14 PM

## 2018-04-26 NOTE — Clinical Social Work Note (Signed)
Clinical Social Work Assessment  Patient Details  Name: [name] MRN: [id number] Date of Birth: 1931/03/05  Date of referral:  04/26/18               Reason for consult:  Facility Placement                Permission sought to share information with:  Facility Sport and exercise psychologist, Family Supports Permission granted to share information::  Yes, Verbal Permission Granted  Name::     Medical sales representative::  SNFs  Relationship::  Daughter  Contact Information:  [phone]  Housing/Transportation Living arrangements for the past 2 months:  [place] of Information:  Patient, Adult Children Patient Interpreter Needed:  None Criminal Activity/Legal Involvement Pertinent to Current Situation/Hospitalization:  No - Comment as needed Significant Relationships:  Adult Children Lives with:  Facility Resident Do you feel safe going back to the place where you live?  Yes Need for family participation in patient care:  Yes (Comment)  Care giving concerns:  CSW received consult for possible SNF placement at time of discharge. CSW spoke with patient and her daughter at bedside. She reported that she resides at [facility] with hospice and would like to return there. However, [facility] would prefer her to go to SNF before returning. CSW to continue to follow and assist with discharge planning needs.   Social Worker assessment / plan:  CSW spoke with patient concerning possibility of rehab at [facility] before returning home.  Employment status:  Retired Forensic scientist:  Other (Comment Required)(Hospice) PT Recommendations:  Home with [place] / Referral to community resources:  [place]  Patient/Family's Response to care:  Patient recognizes need for rehab before returning home and is agreeable to a SNF in [facility] if [facility] will not accept her back. Patient's daughter reported preference for [facility] or [facility]. Patient's daughter aware that they will have to make the decision to private pay at SNF with hospice versus revoking hospice and getting insurance approval through Medicare. [facility] (Amy) stated they would have to assess the patient, but have not contacted CSW back.   Patient/Family's Understanding of and Emotional Response to Diagnosis, Current Treatment, and Prognosis:  Patient/family is realistic regarding therapy needs and expressed being hopeful for return to ALF with hospice. Patient expressed understanding of CSW role and discharge process as well as medical condition. No questions/concerns about plan or treatment.    Emotional Assessment Appearance:  Appears stated age Attitude/Demeanor/Rapport:  Gracious Affect (typically observed):  Accepting, Appropriate, Pleasant Orientation:  Oriented to Self, Oriented to Place, Oriented to  Time, Oriented to Situation Alcohol / Substance use:  Not Applicable Psych involvement (Current and /or in the community):  No (Comment)  Discharge Needs  Concerns to be addressed:  Care Coordination Readmission within the last 30 days:  Yes Current discharge risk:  Dependent with Mobility Barriers to Discharge:  Continued Medical Work up   [employer], LCSW 04/26/2018, 4:49 PM

## 2018-04-26 NOTE — Evaluation (Signed)
Physical Therapy Evaluation Patient Details Name: [name] MRN: [id number] DOB: 02-01-1931 Today's Date: 04/26/2018   History of Present Illness  Patient is an 84 y/o female presenting to the ED on 04/23/18 s/p fall with resultant L hip fracture. IM nail fixation on 04/25/18 with current WBAT. PMH significant for HTN; PMR; afib on Xarelto; dementia; pacemaker placement; COPD on 3L home O2; and CHF.     Clinical Impression  [name] is a very pleasant 84 y/o admitted with the above listed diagnosis. Patient and daughter report patient was ambulatory for short distance prior to admission, but used w/c for primary means of mobility due to COPD. Patient today requiring Mod-Max A for bed mobility and transfers to recliner with poor weight shift onto L LE due to pain. Discussion with daughter on need for rehab services at discharge due to limited mobility and need for physical assist - daughter to call ALF to confirm level of care will be met with rehab services. PT to follow acutely to maximize functional mobility.     Follow Up Recommendations Home health PT;Supervision/Assistance - 24 hour    Equipment Recommendations  None recommended by PT    Recommendations for Other Services OT consult     Precautions / Restrictions Precautions Precautions: Fall Restrictions Weight Bearing Restrictions: Yes LLE Weight Bearing: Weight bearing as tolerated      Mobility  Bed Mobility Overal bed mobility: Needs Assistance Bed Mobility: Supine to Sit     Supine to sit: Mod assist;+2 for physical assistance     General bed mobility comments: patient able to initiate LE towards EOB; requires physical assist for hip/trunk righting  Transfers Overall transfer level: Needs assistance Equipment used: 2 person hand held assist Transfers: Sit to/from [employer] Transfers Sit to Stand: Mod assist;+2 physical assistance Stand pivot transfers: Max assist;+2 physical assistance        General transfer comment: Mod A +2 to power up at bedside - able to reamin static standing with B UT support at PT/OT - little weight shift to L LE with pivot transfer requiring Max A due to limited weight shifting  Ambulation/Gait             General Gait Details: deferred  Stairs            Wheelchair Mobility    Modified Rankin (Stroke Patients Only)       Balance Overall balance assessment: Needs assistance Sitting-balance support: Bilateral upper extremity supported;Feet supported Sitting balance-Leahy Scale: Fair     Standing balance support: Bilateral upper extremity supported Standing balance-Leahy Scale: Poor Standing balance comment: reliant on external support                             Pertinent Vitals/Pain Pain Assessment: No/denies pain(up to 5/10 with mobility at L hip)    Home Living Family/patient expects to be discharged to:: Assisted living([facility])               Home Equipment: Walker - 2 wheels;Wheelchair - manual(supplemental O2 - 4L) Additional Comments: has single apartment; can have meals delivered to room    Prior Function Level of Independence: Independent with assistive device(s);Needs assistance   Gait / Transfers Assistance Needed: uses RW for short distance in apartment; otherwise uses w/c for primary means of mobility  ADL's / Homemaking Assistance Needed: has assist for dressing and bathing; taking sponge baths recently  Comments: currently under hospice care  Hand Dominance   Dominant Hand: Right    Extremity/Trunk Assessment   Upper Extremity Assessment Upper Extremity Assessment: Defer to OT evaluation    Lower Extremity Assessment Lower Extremity Assessment: Generalized weakness;RLE deficits/detail;LLE deficits/detail RLE Deficits / Details: able to lift LE against gravity LLE Deficits / Details: unable to lift against gravity, able to slide to EOB    Cervical / Trunk  Assessment Cervical / Trunk Assessment: Kyphotic  Communication   Communication: No difficulties  Cognition Arousal/Alertness: Awake/alert Behavior During Therapy: WFL for tasks assessed/performed Overall Cognitive Status: Within Functional Limits for tasks assessed                                        General Comments General comments (skin integrity, edema, etc.): multiple skin tears; daughter present and very supportive    Exercises     Assessment/Plan    PT Assessment Patient needs continued PT services  PT Problem List Decreased strength;Decreased range of motion;Decreased activity tolerance;Decreased balance;Decreased mobility;Decreased knowledge of use of DME;Decreased safety awareness       PT Treatment Interventions DME instruction;Gait training;Functional mobility training;Therapeutic activities;Therapeutic exercise;Balance training;Patient/family education    PT Goals (Current goals can be found in the Care Plan section)  Acute Rehab PT Goals Patient Stated Goal: return to [facility] PT Goal Formulation: With patient Time For Goal Achievement: 05/10/18 Potential to Achieve Goals: Fair    Frequency Min 3X/week   Barriers to discharge        Co-evaluation PT/OT/SLP Co-Evaluation/Treatment: Yes Reason for Co-Treatment: For patient/therapist safety;To address functional/ADL transfers PT goals addressed during session: Mobility/safety with mobility;Balance         AM-PAC PT "6 Clicks" Daily Activity  Outcome Measure Difficulty turning over in bed (including adjusting bedclothes, sheets and blankets)?: Unable Difficulty moving from lying on back to sitting on the side of the bed? : Unable Difficulty sitting down on and standing up from a chair with arms (e.g., wheelchair, bedside commode, etc,.)?: Unable Help needed moving to and from a bed to chair (including a wheelchair)?: A Lot Help needed walking in hospital room?: Total Help needed  climbing 3-5 steps with a railing? : Total 6 Click Score: 7    End of Session Equipment Utilized During Treatment: Gait belt;Oxygen(4L via [place]) Activity Tolerance: Patient tolerated treatment well;Patient limited by pain Patient left: in chair;with call bell/phone within reach;with chair alarm set;with family/visitor present Nurse Communication: Mobility status PT Visit Diagnosis: Unsteadiness on feet (R26.81);Other abnormalities of gait and mobility (R26.89);Muscle weakness (generalized) (M62.81);History of falling (Z91.81)    Time: 1610-9604 PT Time Calculation (min) (ACUTE ONLY): 28 min   Charges:   PT Evaluation $PT Eval Moderate Complexity: 1 Mod          Lanney Gins, PT, DPT Supplemental Physical Therapist 04/26/18 10:03 AM Pager: [phone] Office: [phone]

## 2018-04-26 NOTE — Progress Notes (Signed)
Subjective: 1 Day Post-Op Procedure(s) (LRB): intramedullary nail intertrochanteric left hip (Left) Patient reports pain as mild.    Objective: Vital signs in last 24 hours: Temp:  [97.7 F (36.5 C)-98.5 F (36.9 C)] 98.1 F (36.7 C) (11/07 0425) Pulse Rate:  [70-78] 74 (11/07 0425) Resp:  [13-18] 18 (11/07 0001) BP: (103-137)/(61-80) 117/66 (11/07 0001) SpO2:  [91 %-99 %] 96 % (11/07 0850)  Intake/Output from previous day: 11/06 0701 - 11/07 0700 In: 1403.4 [P.O.:315; I.V.:800; IV Piggyback:288.4] Out: 1425 [Urine:1325; Blood:100] Intake/Output this shift: No intake/output data recorded.  Recent Labs    04/24/18 0324 04/25/18 0341 04/26/18 0459  HGB 10.6* 10.8* 9.8*   Recent Labs    04/25/18 0341 04/26/18 0459  WBC 11.0* 11.4*  RBC 4.00 3.69*  HCT 35.8* 33.2*  PLT 290 318   Recent Labs    04/25/18 0341 04/26/18 0459  NA 134* 137  K 3.6 3.7  CL 95* 97*  CO2 32 31  BUN 13 12  CREATININE 0.72 0.73  GLUCOSE 103* 132*  CALCIUM 8.4* 8.4*   No results for input(s): LABPT, INR in the last 72 hours.  Neurologically intact Compartment soft Dressings LLE clean and dry-skin very thin and easily avulsed   Assessment/Plan: 1 Day Post-Op Procedure(s) (LRB): intramedullary nail intertrochanteric left hip (Left) Advance diet Up with therapy Discharge to [facility] manager to assess placement Ok to restart xarelto   Garald Balding 04/26/2018, 11:56 AM

## 2018-04-26 NOTE — Progress Notes (Signed)
This nurse was informed by the nurse tech that the patient was unable to tolerate having her blood pressure checked due to the pain from the blood pressure cuff on her arm.  It was reported to this nurse that this has been an on-going issue with the patient's sensitive skin not tolerating the blood pressure cuff.  Review of previous vitals showed stable blood pressures and MAPs.  Informed nurse tech to get all other vitals as usual and forgo blood pressure at this time.  Will continue to monitor.

## 2018-04-26 NOTE — Progress Notes (Signed)
[facility] GIP Visit @ 0930  This is a related and covered GIP admission of 04/23/18 with [facility] diagnosis of COPD per Dr. Konrad Dolores. Patient has an [facility] DNR. EMS was activated by [facility], after patient was found to by lying on the floor after a fall complaining of left hip pain and inability to get up. Facility unable to get her up due to extreme pain and deformity, and notified family and EMS. Hospice was notified that pt would be going to the ED for evaluation. Pt was admitted on 04/23/18 for fracture of left hip.  This is day 4 of [facility] GIP.  Pt had IMN on 11/6.  Visited pt in the room with her daughter Caren Griffins present. Pt alert, intermittently and mildly confused (baseline per daughter) with no complaints currently.  Denies pain, was joking and telling stories.  Pt receiving ancef IV 2g q6h for surgical prophylaxis, morphine 0.5 mg IV and hydrocodone for pain.  PT/OT to assess to determine next needs for pt.    Goals of care: Lengthy discussion with patient and her daughter about next steps.  They both want to continue with hospice services and are trying to determine if they can self pay for rehab.  They feel that she has been managed well and they do not want to revoke hospice for rehab if at all possible.  [facility] SW is working on these concerns.  Per daughter, they were told she may discharge on 11/8 if she continues to do well.  Discharge planning: Ongoing. Dependent on progression and recommendation.  IDT updated.  Should ambulance transfer be needed at discharge, please use [facility] as they contract this service for [facility].  Please call with any hospice related questions or concerns.  Thank you, Venia Carbon BSN, [facility] Liaison (listed in [place]) [phone]

## 2018-04-26 NOTE — Progress Notes (Signed)
PROGRESS NOTE        PATIENT DETAILS Name: [name] Age: 84 y.o. Sex: female Date of Birth: 05/20/1931 Admit Date: 04/23/2018 Admitting Physician Karmen Bongo, MD TSV:[id number], Christiane Ha, MD  Brief Narrative: Patient is a 84 y.o. female with a history of venous thromboembolism (DVT) on Xarelto, atrial fibrillation, COPD on home O2, pacemaker implantation-followed by hospice at home-presenting to the hospital on 11/4 following a mechanical fall, she was found to have a left hip fracture.  She was subsequently admitted to the hospitalist service-orthopedics planning on left hip repair on 11/6.  See below for further details.  Subjective: Lying comfortably in bed-no chest pain or shortness of breath.  Assessment/Plan: Mechanical fall resulting in a left hip fracture: Underwent closed reduction and intramedullary nail fixation on 11/6.  Orthopedics following-postop care deferred to orthopedics.  Orthopedics okay to resume Xarelto.  Await PT evaluation-social worker following whether patient needs SNF or needs to go back to ALF with hospice care.  Recent lower extremity DVT: Per daughter-this was found at ALF sometime in September-Xarelto initially held-and started on IV heparin preoperatively-we will also okay to resume Xarelto starting 11/7  Persistent AF: Paced rhythm-reviewed most recent outpatient cardiology note-although she has a chadsvasc score 5-given advanced age and risk of falls-not considered a good candidate for anticoagulation.  However she is back on Xarelto for the past few months as she was found to have a DVT.  Sick sinus syndrome: Per outpatient cardiology note-she is 100% pacemaker dependent.  Follow.  Chronic diastolic heart failure: Euvolemic-follow volume status closely.   Severe COPD chronic hypoxic respiratory failure on home O2: Appears stable-lungs are clear-continue bronchodilators.  She appears to have severe COPD and is on home  hospice.    Polymyalgia rheumatica: Stable-continue prednisone  Dementia/depression: Stable-continue Lexapro  Left leg abrasion/skin breakdown: Large area of skin breakdown in the left upper leg/lower thigh-appreciate wound care evaluation.  This was secondary to a mechanical fall.    Non-severe (moderate) malnutrition in context of chronic illness: Continue supplements  DVT Prophylaxis: Xarelto  Code Status: DNR  Family Communication: Daughter at bedside  Disposition Plan: Remain inpatient-probably SNF on discharge versus ALF with hospice on discharge  Antimicrobial agents: Anti-infectives (From admission, onward)   Start     Dose/Rate Route Frequency Ordered Stop   04/25/18 2300  ceFAZolin (ANCEF) IVPB 2g/100 mL premix     2 g 200 mL/hr over 30 Minutes Intravenous Every 6 hours 04/25/18 1743 04/26/18 0634   04/25/18 1415  ceFAZolin (ANCEF) IVPB 2g/100 mL premix     2 g 200 mL/hr over 30 Minutes Intravenous  Once 04/25/18 1406 04/25/18 1650   04/25/18 1407  ceFAZolin (ANCEF) 2-4 GM/100ML-% IVPB    Note to Pharmacy:  [place] Lewis, Lindsi   : cabinet override      04/25/18 1407 04/25/18 1635      Procedures: None  CONSULTS:  orthopedic surgery  Time spent: 25- minutes-Greater than 50% of this time was spent in counseling, explanation of diagnosis, planning of further management, and coordination of care.  MEDICATIONS: Scheduled Meds: . ALPRAZolam  0.25 mg Oral QHS  . arformoterol  15 mcg Nebulization BID  . budesonide (PULMICORT) nebulizer solution  0.25 mg Nebulization BID  . Chlorhexidine Gluconate Cloth  6 each Topical Once  . docusate sodium  100 mg Oral BID  . escitalopram  10 mg Oral Daily  . feeding supplement (ENSURE ENLIVE)  237 mL Oral BID BM  . mouth rinse  15 mL Mouth Rinse BID  . metoprolol succinate  25 mg Oral Daily  . predniSONE  10 mg Oral Q breakfast  . rivaroxaban  20 mg Oral Q supper  . umeclidinium bromide  1 puff Inhalation Daily   Continuous  Infusions: . lactated ringers 10 mL/hr at 04/25/18 1320  . methocarbamol (ROBAXIN) IV     PRN Meds:.guaiFENesin-dextromethorphan, HYDROcodone-acetaminophen, ipratropium-albuterol, LORazepam, menthol-cetylpyridinium **OR** phenol, methocarbamol **OR** methocarbamol (ROBAXIN) IV, metoCLOPramide **OR** metoCLOPramide (REGLAN) injection, morphine injection, morphine CONCENTRATE, ondansetron **OR** ondansetron (ZOFRAN) IV, senna-docusate   PHYSICAL EXAM: Vital signs: Vitals:   04/25/18 2016 04/26/18 0001 04/26/18 0425 04/26/18 0850  BP: 103/66 117/66    Pulse: 73 75 74   Resp:  18    Temp: 97.9 F (36.6 C) 98.5 F (36.9 C) 98.1 F (36.7 C)   TempSrc:      SpO2: 97% 96% 96% 96%  Weight:      Height:       Filed Weights   04/23/18 0639  Weight: 56.2 kg   Body mass index is 20.63 kg/m.   General appearance:Awake, alert, not in any distress.  Eyes:no scleral icterus. HEENT: Atraumatic and Normocephalic Neck: supple, no JVD. Resp:Good air entry bilaterally,no rales or rhonchi CVS: S1 S2 regular, no murmurs.  GI: Bowel sounds present, Non tender and not distended with no gaurding, rigidity or rebound. Extremities: B/L Lower Ext shows no edema, both legs are warm to touch Neurology:  Non focal Musculoskeletal:No digital cyanosis Skin:No Rash, warm and dry Wounds:N/A  I have personally reviewed following labs and imaging studies  LABORATORY DATA: CBC: Recent Labs  Lab 04/23/18 0648 04/24/18 0324 04/25/18 0341 04/26/18 0459  WBC 12.4* 9.9 11.0* 11.4*  HGB 12.3 10.6* 10.8* 9.8*  HCT 40.7 36.6 35.8* 33.2*  MCV 90.0 90.6 89.5 90.0  PLT 379 324 290 993    Basic Metabolic Panel: Recent Labs  Lab 04/23/18 0648 04/24/18 0324 04/24/18 0726 04/25/18 0341 04/26/18 0459  NA 135  --  131* 134* 137  K 2.9*  --  3.5 3.6 3.7  CL 94*  --  92* 95* 97*  CO2 35*  --  31 32 31  GLUCOSE 110*  --  106* 103* 132*  BUN 15  --  16 13 12   CREATININE 1.20*  --  0.85 0.72 0.73    CALCIUM 8.8*  --  8.2* 8.4* 8.4*  MG  --  1.7  --  2.1  --     GFR: Estimated Creatinine Clearance: 45.6 mL/min (by C-G formula based on SCr of 0.73 mg/dL).  Liver Function Tests: No results for input(s): AST, ALT, ALKPHOS, BILITOT, PROT, ALBUMIN in the last 168 hours. No results for input(s): LIPASE, AMYLASE in the last 168 hours. No results for input(s): AMMONIA in the last 168 hours.  Coagulation Profile: No results for input(s): INR, PROTIME in the last 168 hours.  Cardiac Enzymes: No results for input(s): CKTOTAL, CKMB, CKMBINDEX, TROPONINI in the last 168 hours.  BNP (last 3 results) No results for input(s): PROBNP in the last 8760 hours.  HbA1C: No results for input(s): HGBA1C in the last 72 hours.  CBG: No results for input(s): GLUCAP in the last 168 hours.  Lipid Profile: No results for input(s): CHOL, HDL, LDLCALC, TRIG, CHOLHDL, LDLDIRECT in the last 72 hours.  Thyroid Function Tests: No results for input(s): TSH, T4TOTAL,  FREET4, T3FREE, THYROIDAB in the last 72 hours.  Anemia Panel: No results for input(s): VITAMINB12, FOLATE, FERRITIN, TIBC, IRON, RETICCTPCT in the last 72 hours.  Urine analysis:    Component Value Date/Time   COLORURINE YELLOW 03/17/2018 1252   APPEARANCEUR CLEAR 03/17/2018 1252   LABSPEC 1.011 03/17/2018 1252   PHURINE 5.0 03/17/2018 1252   GLUCOSEU NEGATIVE 03/17/2018 1252   HGBUR SMALL (A) 03/17/2018 1252   BILIRUBINUR NEGATIVE 03/17/2018 1252   KETONESUR 5 (A) 03/17/2018 1252   PROTEINUR NEGATIVE 03/17/2018 1252   UROBILINOGEN 1.0 01/19/2011 1030   NITRITE NEGATIVE 03/17/2018 1252   LEUKOCYTESUR NEGATIVE 03/17/2018 1252    Sepsis Labs: Lactic Acid, Venous    Component Value Date/Time   LATICACIDVEN 1.76 03/26/2018 0825    MICROBIOLOGY: Recent Results (from the past 240 hour(s))  Surgical pcr screen     Status: None   Collection Time: 04/23/18  9:42 PM  Result Value Ref Range Status   MRSA, PCR NEGATIVE NEGATIVE  Final   Staphylococcus aureus NEGATIVE NEGATIVE Final    Comment: (NOTE) The Xpert SA Assay (FDA approved for NASAL specimens in patients 33 years of age and older), is one component of a comprehensive surveillance program. It is not intended to diagnose infection nor to guide or monitor treatment. Performed at [facility], [place] [address], [place], [place] [address]     RADIOLOGY STUDIES/RESULTS: Dg Pelvis 1-2 Views  Result Date: 04/23/2018 CLINICAL DATA:  Fall EXAM: PELVIS - 1-2 VIEW COMPARISON:  01/13/2011 FINDINGS: There is a new comminuted intertrochanteric proximal left femur fracture with varus deformity. There is an intramedullary rod and dynamic compression screw transfixing the previously visualized right intertrochanteric femur fracture. There is no breakage or loosening of the hardware. Osteopenia. Degenerative changes in the lower lumbar spine. IMPRESSION: New comminuted intertrochanteric proximal left femur fracture. Critical Value/emergent results were called by telephone at the time of interpretation on 04/23/2018 at 8:00 am to Pilar Plate, RN, who verbally acknowledged these results. Electronically Signed   By: Marybelle Killings M.D.   On: 04/23/2018 08:00   Ct Head Wo Contrast  Result Date: 04/23/2018 CLINICAL DATA:  Fall today.  No loss of consciousness. EXAM: CT HEAD WITHOUT CONTRAST CT CERVICAL SPINE WITHOUT CONTRAST TECHNIQUE: Multidetector CT imaging of the head and cervical spine was performed following the standard protocol without intravenous contrast. Multiplanar CT image reconstructions of the cervical spine were also generated. COMPARISON:  None. FINDINGS: CT HEAD FINDINGS Brain: Mild chronic ischemic white matter disease is noted. No mass effect or midline shift is noted. Ventricular size is within normal limits. There is no evidence of mass lesion, hemorrhage or acute infarction. Vascular: No hyperdense vessel or unexpected calcification. Skull: Normal. Negative for  fracture or focal lesion. Sinuses/Orbits: No acute finding. Other: None. CT CERVICAL SPINE FINDINGS Alignment: Normal. Skull base and vertebrae: No acute fracture. No primary bone lesion or focal pathologic process. Soft tissues and spinal canal: No prevertebral fluid or swelling. No visible canal hematoma. Disc levels: Mild degenerative disc disease is noted at C3-4 C4-5 and C5-6. Upper chest: Probable scarring is noted in the right upper lobe. Other: Mild degenerative changes are seen involving posterior facet joints bilaterally. IMPRESSION: Mild chronic ischemic white matter disease. No acute intracranial abnormality seen. Mild multilevel degenerative disc disease. No acute abnormality seen in the cervical spine. Electronically Signed   By: Marijo Conception, M.D.   On: 04/23/2018 08:28   Ct Cervical Spine Wo Contrast  Result Date:  04/23/2018 CLINICAL DATA:  Fall today.  No loss of consciousness. EXAM: CT HEAD WITHOUT CONTRAST CT CERVICAL SPINE WITHOUT CONTRAST TECHNIQUE: Multidetector CT imaging of the head and cervical spine was performed following the standard protocol without intravenous contrast. Multiplanar CT image reconstructions of the cervical spine were also generated. COMPARISON:  None. FINDINGS: CT HEAD FINDINGS Brain: Mild chronic ischemic white matter disease is noted. No mass effect or midline shift is noted. Ventricular size is within normal limits. There is no evidence of mass lesion, hemorrhage or acute infarction. Vascular: No hyperdense vessel or unexpected calcification. Skull: Normal. Negative for fracture or focal lesion. Sinuses/Orbits: No acute finding. Other: None. CT CERVICAL SPINE FINDINGS Alignment: Normal. Skull base and vertebrae: No acute fracture. No primary bone lesion or focal pathologic process. Soft tissues and spinal canal: No prevertebral fluid or swelling. No visible canal hematoma. Disc levels: Mild degenerative disc disease is noted at C3-4 C4-5 and C5-6. Upper chest:  Probable scarring is noted in the right upper lobe. Other: Mild degenerative changes are seen involving posterior facet joints bilaterally. IMPRESSION: Mild chronic ischemic white matter disease. No acute intracranial abnormality seen. Mild multilevel degenerative disc disease. No acute abnormality seen in the cervical spine. Electronically Signed   By: Marijo Conception, M.D.   On: 04/23/2018 08:28   Dg C-arm 1-60 Min  Result Date: 04/25/2018 CLINICAL DATA:  Hip fracture EXAM: LEFT FEMUR 2 VIEWS; DG C-ARM 61-120 MIN COMPARISON:  04/23/2018 FINDINGS: Three low resolution intraoperative spot views of the left hip. Total fluoroscopy time was 1 minutes 31 seconds. Intramedullary rod and distal screw fixation of the left femur for intertrochanteric fracture. IMPRESSION: Intraoperative fluoroscopic assistance provided during surgical fixation of left hip fracture Electronically Signed   By: Donavan Foil M.D.   On: 04/25/2018 16:50   Dg C-arm 1-60 Min  Result Date: 04/25/2018 CLINICAL DATA:  Hip fracture EXAM: LEFT FEMUR 2 VIEWS; DG C-ARM 61-120 MIN COMPARISON:  04/23/2018 FINDINGS: Three low resolution intraoperative spot views of the left hip. Total fluoroscopy time was 1 minutes 31 seconds. Intramedullary rod and distal screw fixation of the left femur for intertrochanteric fracture. IMPRESSION: Intraoperative fluoroscopic assistance provided during surgical fixation of left hip fracture Electronically Signed   By: Donavan Foil M.D.   On: 04/25/2018 16:50   Dg Femur Min 2 Views Left  Result Date: 04/25/2018 CLINICAL DATA:  Hip fracture EXAM: LEFT FEMUR 2 VIEWS; DG C-ARM 61-120 MIN COMPARISON:  04/23/2018 FINDINGS: Three low resolution intraoperative spot views of the left hip. Total fluoroscopy time was 1 minutes 31 seconds. Intramedullary rod and distal screw fixation of the left femur for intertrochanteric fracture. IMPRESSION: Intraoperative fluoroscopic assistance provided during surgical fixation of  left hip fracture Electronically Signed   By: Donavan Foil M.D.   On: 04/25/2018 16:50   Dg Femur Min 2 Views Left  Result Date: 04/23/2018 CLINICAL DATA:  [place] this morning.  Left hip and femur pain. EXAM: LEFT FEMUR 2 VIEWS COMPARISON:  12/28/2016 and 04/23/2018 FINDINGS: Displaced left femoral intertrochanteric fracture. Visualized left pelvic bone structures are intact. Left hip is located. Cortical thickening in the mid and distal left femur is suggestive for old injury. Left knee is located with joint space narrowing and degenerative changes. Joint space narrowing and degenerative disease in the medial and inferior left hip joint. IMPRESSION: Displaced left femoral intertrochanteric fracture. Degenerative changes in the left hip and left knee. Electronically Signed   By: Markus Daft M.D.   On: 04/23/2018  08:02     LOS: 3 days   Oren Binet, MD  [facility]  If 7PM-7AM, please contact night-coverage  Please page via [email or web address] [email or web address]-click on MD name and type text message  04/26/2018, 1:00 PM

## 2018-04-27 DIAGNOSIS — I4891 Unspecified atrial fibrillation: Secondary | ICD-10-CM | POA: Diagnosis not present

## 2018-04-27 DIAGNOSIS — F0390 Unspecified dementia without behavioral disturbance: Secondary | ICD-10-CM | POA: Diagnosis not present

## 2018-04-27 DIAGNOSIS — F329 Major depressive disorder, single episode, unspecified: Secondary | ICD-10-CM | POA: Diagnosis not present

## 2018-04-27 DIAGNOSIS — S81802A Unspecified open wound, left lower leg, initial encounter: Secondary | ICD-10-CM | POA: Diagnosis not present

## 2018-04-27 DIAGNOSIS — S81802D Unspecified open wound, left lower leg, subsequent encounter: Secondary | ICD-10-CM | POA: Diagnosis not present

## 2018-04-27 DIAGNOSIS — J9611 Chronic respiratory failure with hypoxia: Secondary | ICD-10-CM | POA: Diagnosis not present

## 2018-04-27 DIAGNOSIS — R52 Pain, unspecified: Secondary | ICD-10-CM | POA: Diagnosis not present

## 2018-04-27 DIAGNOSIS — M2550 Pain in unspecified joint: Secondary | ICD-10-CM | POA: Diagnosis not present

## 2018-04-27 DIAGNOSIS — R531 Weakness: Secondary | ICD-10-CM | POA: Diagnosis not present

## 2018-04-27 DIAGNOSIS — I110 Hypertensive heart disease with heart failure: Secondary | ICD-10-CM | POA: Diagnosis not present

## 2018-04-27 DIAGNOSIS — Z8781 Personal history of (healed) traumatic fracture: Secondary | ICD-10-CM | POA: Diagnosis not present

## 2018-04-27 DIAGNOSIS — I4811 Longstanding persistent atrial fibrillation: Secondary | ICD-10-CM | POA: Diagnosis not present

## 2018-04-27 DIAGNOSIS — Z515 Encounter for palliative care: Secondary | ICD-10-CM | POA: Diagnosis not present

## 2018-04-27 DIAGNOSIS — J441 Chronic obstructive pulmonary disease with (acute) exacerbation: Secondary | ICD-10-CM | POA: Diagnosis not present

## 2018-04-27 DIAGNOSIS — M353 Polymyalgia rheumatica: Secondary | ICD-10-CM | POA: Diagnosis not present

## 2018-04-27 DIAGNOSIS — I82409 Acute embolism and thrombosis of unspecified deep veins of unspecified lower extremity: Secondary | ICD-10-CM | POA: Diagnosis not present

## 2018-04-27 DIAGNOSIS — R0902 Hypoxemia: Secondary | ICD-10-CM | POA: Diagnosis not present

## 2018-04-27 DIAGNOSIS — E46 Unspecified protein-calorie malnutrition: Secondary | ICD-10-CM | POA: Diagnosis not present

## 2018-04-27 DIAGNOSIS — M6281 Muscle weakness (generalized): Secondary | ICD-10-CM | POA: Diagnosis not present

## 2018-04-27 DIAGNOSIS — Z950 Presence of cardiac pacemaker: Secondary | ICD-10-CM | POA: Diagnosis not present

## 2018-04-27 DIAGNOSIS — Z471 Aftercare following joint replacement surgery: Secondary | ICD-10-CM | POA: Diagnosis not present

## 2018-04-27 DIAGNOSIS — S72002A Fracture of unspecified part of neck of left femur, initial encounter for closed fracture: Secondary | ICD-10-CM | POA: Diagnosis not present

## 2018-04-27 DIAGNOSIS — R278 Other lack of coordination: Secondary | ICD-10-CM | POA: Diagnosis not present

## 2018-04-27 DIAGNOSIS — I442 Atrioventricular block, complete: Secondary | ICD-10-CM | POA: Diagnosis not present

## 2018-04-27 DIAGNOSIS — R634 Abnormal weight loss: Secondary | ICD-10-CM | POA: Diagnosis not present

## 2018-04-27 DIAGNOSIS — Z86718 Personal history of other venous thrombosis and embolism: Secondary | ICD-10-CM | POA: Diagnosis not present

## 2018-04-27 DIAGNOSIS — S72002D Fracture of unspecified part of neck of left femur, subsequent encounter for closed fracture with routine healing: Secondary | ICD-10-CM | POA: Diagnosis not present

## 2018-04-27 DIAGNOSIS — Z7401 Bed confinement status: Secondary | ICD-10-CM | POA: Diagnosis not present

## 2018-04-27 DIAGNOSIS — S72102D Unspecified trochanteric fracture of left femur, subsequent encounter for closed fracture with routine healing: Secondary | ICD-10-CM | POA: Diagnosis not present

## 2018-04-27 DIAGNOSIS — I10 Essential (primary) hypertension: Secondary | ICD-10-CM | POA: Diagnosis not present

## 2018-04-27 DIAGNOSIS — I82401 Acute embolism and thrombosis of unspecified deep veins of right lower extremity: Secondary | ICD-10-CM | POA: Diagnosis not present

## 2018-04-27 DIAGNOSIS — Z7189 Other specified counseling: Secondary | ICD-10-CM | POA: Diagnosis not present

## 2018-04-27 DIAGNOSIS — J9610 Chronic respiratory failure, unspecified whether with hypoxia or hypercapnia: Secondary | ICD-10-CM | POA: Diagnosis not present

## 2018-04-27 DIAGNOSIS — W19XXXA Unspecified fall, initial encounter: Secondary | ICD-10-CM | POA: Diagnosis not present

## 2018-04-27 DIAGNOSIS — I495 Sick sinus syndrome: Secondary | ICD-10-CM | POA: Diagnosis not present

## 2018-04-27 DIAGNOSIS — E440 Moderate protein-calorie malnutrition: Secondary | ICD-10-CM | POA: Diagnosis not present

## 2018-04-27 DIAGNOSIS — R2689 Other abnormalities of gait and mobility: Secondary | ICD-10-CM | POA: Diagnosis not present

## 2018-04-27 DIAGNOSIS — I5032 Chronic diastolic (congestive) heart failure: Secondary | ICD-10-CM | POA: Diagnosis not present

## 2018-04-27 DIAGNOSIS — R410 Disorientation, unspecified: Secondary | ICD-10-CM | POA: Diagnosis not present

## 2018-04-27 DIAGNOSIS — I509 Heart failure, unspecified: Secondary | ICD-10-CM | POA: Diagnosis not present

## 2018-04-27 DIAGNOSIS — E43 Unspecified severe protein-calorie malnutrition: Secondary | ICD-10-CM | POA: Diagnosis not present

## 2018-04-27 DIAGNOSIS — R41841 Cognitive communication deficit: Secondary | ICD-10-CM | POA: Diagnosis not present

## 2018-04-27 DIAGNOSIS — J449 Chronic obstructive pulmonary disease, unspecified: Secondary | ICD-10-CM | POA: Diagnosis not present

## 2018-04-27 MED ORDER — ARFORMOTEROL TARTRATE 15 MCG/2ML IN NEBU: 15.0000 ug | INHALATION_SOLUTION | Freq: Two times a day (BID) | RESPIRATORY_TRACT | Status: AC

## 2018-04-27 MED ORDER — HYDROCODONE-ACETAMINOPHEN 5-325 MG PO TABS: 1.0000 | ORAL_TABLET | Freq: Four times a day (QID) | ORAL | 0 refills | Status: AC | PRN

## 2018-04-27 MED ORDER — TIOTROPIUM BROMIDE MONOHYDRATE 18 MCG IN CAPS: 18.0000 ug | ORAL_CAPSULE | Freq: Every day | RESPIRATORY_TRACT | 12 refills | Status: AC

## 2018-04-27 MED ORDER — LORAZEPAM 1 MG PO TABS: 1.0000 mg | ORAL_TABLET | Freq: Four times a day (QID) | ORAL | 0 refills | Status: AC | PRN

## 2018-04-27 MED ORDER — ALPRAZOLAM 0.25 MG PO TABS: 0.2500 mg | ORAL_TABLET | Freq: Every day | ORAL | 0 refills | Status: AC

## 2018-04-27 MED ORDER — RIVAROXABAN 20 MG PO TABS: 20.0000 mg | ORAL_TABLET | Freq: Every day | ORAL | Status: DC

## 2018-04-27 MED ORDER — PREDNISONE 10 MG PO TABS: 10.0000 mg | ORAL_TABLET | Freq: Every day | ORAL | Status: AC

## 2018-04-27 MED ORDER — ENSURE ENLIVE PO LIQD: 237.0000 mL | Freq: Two times a day (BID) | ORAL | 12 refills | Status: AC

## 2018-04-27 NOTE — Discharge Instructions (Signed)

## 2018-04-27 NOTE — Progress Notes (Signed)
[facility]) hospital liaison note.  Spoke with patient and daughter, [name] at bedside. Patient is going to transfer to SNF to obtain rehabilitation. [facility] CSW notified and will visit with patient and daughter to proceed with hospice revocation.   Please call with any hospice related questions or concerns.  Thank you  Farrel Gordon, RN, [facility] Liaison ( listed on [place]) [phone]

## 2018-04-27 NOTE — Clinical Social Work Placement (Signed)
   CLINICAL SOCIAL WORK PLACEMENT  NOTE  Date:  04/27/2018  Patient Details  Name: [name] MRN: [id number] Date of Birth: [birth year]  Clinical Social Work is seeking post-discharge placement for this patient at the [place] level of care (*CSW will initial, date and re-position this form in  chart as items are completed):  Yes   Patient/family provided with [place] Work Department's list of facilities offering this level of care within the geographic area requested by the patient (or if unable, by the patient's family).  Yes   Patient/family informed of their freedom to choose among providers that offer the needed level of care, that participate in Medicare, Medicaid or managed care program needed by the patient, have an available bed and are willing to accept the patient.  Yes   Patient/family informed of [facility]'s ownership interest in [facility] and [facility], as well as of the fact that they are under no obligation to receive care at these facilities.  PASRR submitted to EDS on       PASRR number received on       Existing PASRR number confirmed on 04/27/18     FL2 transmitted to all facilities in geographic area requested by pt/family on 04/27/18     FL2 transmitted to all facilities within larger geographic area on       Patient informed that his/her managed care company has contracts with or will negotiate with certain facilities, including the following:        Yes   Patient/family informed of bed offers received.  Patient chooses bed at [facility]     Physician recommends and patient chooses bed at      Patient to be transferred to [facility] on 04/27/18.  Patient to be transferred to facility by [facility]     Patient family notified on 04/27/18 of transfer.  Name of family member notified:  Daughter     PHYSICIAN       Additional Comment:     _______________________________________________ Benard Halsted, LCSW 04/27/2018, 12:33 PM

## 2018-04-27 NOTE — NC FL2 (Signed)
[place] LEVEL OF CARE SCREENING TOOL     IDENTIFICATION  Patient Name: [name] Birthdate: 11/09/1930 Sex: female Admission Date (Current Location): 04/23/2018  [facility] [place] Number:  Herbalist and Address:  [facility], [place] [address] [place], [place] [address]      Provider Number: [id number]  Attending Physician Name and Address:  Jonetta Osgood, MD  Relative Name and Phone Number:  [name], daughter, [phone]    Current Level of Care: Hospital Recommended Level of Care: [place] Prior Approval Number:    Date Approved/Denied:   PASRR Number: [id number]  Discharge Plan: SNF    Current Diagnoses: Patient Active Problem List   Diagnosis Date Noted  . Malnutrition of moderate degree 04/25/2018  . Closed left hip fracture, initial encounter ([place]) 04/23/2018  . Palliative care by specialist   . DNR (do not resuscitate)   . Acute on chronic respiratory failure with hypoxia ([place]) 03/26/2018  . Weakness generalized 03/21/2018  . Right middle lobe pneumonia ([place]) 11/20/2017  . Elective replacement indicated for cardiac pacemaker battery at end of lifespan 08/09/2017  . Chronic pulmonary aspiration 05/02/2017  . Encounter for palliative care   . Goals of care, counseling/discussion   . Dyspnea   . Stress fracture of thoracic vertebra 03/19/2017  . Dementia ([place]) 03/19/2017  . Non-traumatic compression fracture of T9 thoracic vertebra 01/02/2017  . Polymyalgia rheumatica ([place])   . Postural dizziness with near syncope   . Syncope 10/11/2016  . Postural dizziness with presyncope 10/11/2016  . Chronic diastolic CHF (congestive heart failure) ([place]) 10/05/2016  . Smoker 10/05/2016  . Closed multiple fractures of right upper extremity with ribs with routine healing 10/05/2016  . HCAP (healthcare-associated pneumonia) 10/31/2015  . GOLD COPD II D 10/31/2015  . Leukocytosis 10/31/2015  .  Benign essential HTN 10/31/2015  . Anxiety state   . COPD exacerbation ([place]) 08/26/2015  . Hypokalemia 08/26/2015  . Anticoagulant long-term use 08/26/2015  . DM (dermatomyositis) 08/26/2015  . Acute on chronic diastolic CHF (congestive heart failure), NYHA class 1 ([place])   . Sepsis ([place]) 06/07/2015  . CAP (community acquired pneumonia) 06/07/2015  . Paroxysmal atrial fibrillation ([place]) 09/04/2014  . Pacemaker 07/13/2013  . CHB (complete heart block) ([place]) 07/13/2013  . SSS (sick sinus syndrome) ([place]) 07/13/2013  . Orthostatic hypotension 07/13/2013  . Aortic insufficiency 07/13/2013    Orientation RESPIRATION BLADDER Height & Weight     Self, Time, Situation, Place  O2(Nasal cannula 4L) Incontinent, External catheter Weight: 56.2 kg Height:  5\' 5"  (165.1 cm)  BEHAVIORAL SYMPTOMS/MOOD NEUROLOGICAL BOWEL NUTRITION STATUS      Continent Diet(Please see DC Summary)  AMBULATORY STATUS COMMUNICATION OF NEEDS Skin   Extensive Assist Verbally Surgical wounds, Other (Comment)(Closed incision on leg;non-pressure wound on leg;wound on arm (sensitive skin))                       Personal Care Assistance Level of Assistance  Bathing, Feeding, Dressing Bathing Assistance: Maximum assistance Feeding assistance: Independent Dressing Assistance: Maximum assistance     Functional Limitations Info  Sight, Hearing, Speech Sight Info: Adequate Hearing Info: Adequate Speech Info: Adequate    SPECIAL CARE FACTORS FREQUENCY  PT (By licensed PT), OT (By licensed OT)     PT Frequency: 5x/week OT Frequency: 3x/week            Contractures Contractures Info: Not present    Additional Factors  Info  Code Status, Allergies, Psychotropic, Isolation Precautions Code Status Info: DNR Allergies Info: NKA Psychotropic Info: Xanax; Lexapro   Isolation Precautions Info: MRSA 10/27     Current Medications (04/27/2018):  This is the current hospital active medication list Current  Facility-Administered Medications  Medication Dose Route Frequency Provider Last Rate Last Dose  . ALPRAZolam Duanne Moron) tablet 0.25 mg  0.25 mg Oral QHS Karmen Bongo, MD   0.25 mg at 04/26/18 2104  . arformoterol (BROVANA) nebulizer solution 15 mcg  15 mcg Nebulization BID Jonetta Osgood, MD   15 mcg at 04/27/18 0823  . budesonide (PULMICORT) nebulizer solution 0.25 mg  0.25 mg Nebulization BID Jonetta Osgood, MD   0.25 mg at 04/27/18 0823  . Chlorhexidine Gluconate Cloth 2 % PADS 6 each  6 each Topical Once Ghimire, Henreitta Leber, MD      . docusate sodium (COLACE) capsule 100 mg  100 mg Oral BID Karmen Bongo, MD   100 mg at 04/27/18 0950  . escitalopram (LEXAPRO) tablet 10 mg  10 mg Oral Daily Karmen Bongo, MD   10 mg at 04/27/18 0950  . feeding supplement (ENSURE ENLIVE) (ENSURE ENLIVE) liquid 237 mL  237 mL Oral BID BM Jonetta Osgood, MD   237 mL at 04/27/18 0950  . guaiFENesin-dextromethorphan (ROBITUSSIN DM) 100-10 MG/5ML syrup 10 mL  10 mL Oral Q4H PRN Karmen Bongo, MD      . HYDROcodone-acetaminophen (NORCO/VICODIN) 5-325 MG per tablet 1-2 tablet  1-2 tablet Oral Q6H PRN Karmen Bongo, MD   2 tablet at 04/26/18 2104  . ipratropium-albuterol (DUONEB) 0.5-2.5 (3) MG/3ML nebulizer solution 3 mL  3 mL Nebulization Q6H PRN Karmen Bongo, MD      . lactated ringers infusion   Intravenous Continuous [place], Chelsey L, MD 10 mL/hr at 04/25/18 1320    . LORazepam (ATIVAN) tablet 1 mg  1 mg Oral Q6H PRN Karmen Bongo, MD      . MEDLINE mouth rinse  15 mL Mouth Rinse BID Jonetta Osgood, MD   15 mL at 04/27/18 0950  . menthol-cetylpyridinium (CEPACOL) lozenge 3 mg  1 lozenge Oral PRN Biagio Borg D, PA-C       Or  . phenol (CHLORASEPTIC) mouth spray 1 spray  1 spray Mouth/Throat PRN Petrarca, Mike Craze, PA-C      . methocarbamol (ROBAXIN) tablet 500 mg  500 mg Oral Q6H PRN Karmen Bongo, MD       Or  . methocarbamol (ROBAXIN) 500 mg in dextrose 5 % 50 mL IVPB  500 mg  Intravenous Q6H PRN Karmen Bongo, MD      . metoCLOPramide (REGLAN) tablet 5-10 mg  5-10 mg Oral Q8H PRN Biagio Borg D, PA-C       Or  . metoCLOPramide (REGLAN) injection 5-10 mg  5-10 mg Intravenous Q8H PRN Petrarca, Brian D, PA-C      . metoprolol succinate (TOPROL-XL) 24 hr tablet 25 mg  25 mg Oral Daily Karmen Bongo, MD   25 mg at 04/26/18 1044  . morphine 2 MG/ML injection 0.5 mg  0.5 mg Intravenous Q2H PRN Karmen Bongo, MD   0.5 mg at 04/23/18 1510  . morphine CONCENTRATE 10 MG/0.5ML oral solution 5 mg  5 mg Oral Q4H PRN Karmen Bongo, MD      . ondansetron [facility]) tablet 4 mg  4 mg Oral Q6H PRN Biagio Borg D, PA-C       Or  . ondansetron (ZOFRAN) injection 4 mg  4 mg Intravenous Q6H PRN Cherylann Ratel, PA-C      . predniSONE (DELTASONE) tablet 10 mg  10 mg Oral Q breakfast Karmen Bongo, MD   10 mg at 04/27/18 0950  . rivaroxaban (XARELTO) tablet 20 mg  20 mg Oral Q supper Karren Cobble, [place]      . senna-docusate (Senokot-S) tablet 1 tablet  1 tablet Oral QHS PRN Karmen Bongo, MD      . umeclidinium bromide (INCRUSE ELLIPTA) 62.5 MCG/INH 1 puff  1 puff Inhalation Daily Jonetta Osgood, MD   1 puff at 04/27/18 4458     Discharge Medications: Please see discharge summary for a list of discharge medications.  Relevant Imaging Results:  Relevant Lab Results:   Additional Information SSN# [id number]  Palliative care to follow at SNF. Requires wireless internet for her heart monitor.   Benard Halsted, LCSW

## 2018-04-27 NOTE — Progress Notes (Signed)
Nsg Discharge Note  Admit Date:  04/23/2018 Discharge date: 04/27/2018   [facility]ust[facility] to be D/C'd Skilled nursing facility per MD order.  AVS completed.  Copy for chart, and copy for patient signed, and dated. Patient/caregiver able to verbalize understanding.  Discharge Medication: Allergies as of 04/27/2018   No Known Allergies     Medication List    STOP taking these medications   acetaminophen 325 MG tablet Commonly known as:  TYLENOL   morphine CONCENTRATE 10 MG/0.5ML Soln concentrated solution     TAKE these medications   albuterol (2.5 MG/3ML) 0.083% nebulizer solution Commonly known as:  PROVENTIL Take 3 mLs (2.5 mg total) by nebulization 3 (three) times daily.   ALPRAZolam 0.25 MG tablet Commonly known as:  XANAX Take 1 tablet (0.25 mg total) by mouth at bedtime.   arformoterol 15 MCG/2ML Nebu Commonly known as:  BROVANA Take 2 mLs (15 mcg total) by nebulization 2 (two) times daily.   escitalopram 10 MG tablet Commonly known as:  LEXAPRO Take 10 mg by mouth daily.   feeding supplement (ENSURE ENLIVE) Liqd Take 237 mLs by mouth 2 (two) times daily between meals.   furosemide 40 MG tablet Commonly known as:  LASIX Take 1 tablet (40 mg total) by mouth daily.   guaiFENesin-dextromethorphan 100-10 MG/5ML syrup Commonly known as:  ROBITUSSIN DM Take 10 mLs by mouth every 4 (four) hours as needed for cough.   HYDROcodone-acetaminophen 5-325 MG tablet Commonly known as:  NORCO/VICODIN Take 1 tablet by mouth every 6 (six) hours as needed for moderate pain. What changed:  when to take this   ipratropium-albuterol 0.5-2.5 (3) MG/3ML Soln Commonly known as:  DUONEB Take 3 mLs by nebulization every 6 (six) hours as needed. What changed:  reasons to take this   LORazepam 1 MG tablet Commonly known as:  ATIVAN Take 1 tablet (1 mg total) by mouth every 6 (six) hours as needed for anxiety.   metoprolol succinate 25 MG 24 hr tablet Commonly known as:   TOPROL-XL Take 25 mg by mouth daily.   ondansetron 4 MG tablet Commonly known as:  ZOFRAN Take 4 mg by mouth every 6 (six) hours as needed for nausea or vomiting.   OXYGEN Inhale 2 L into the lungs continuous.   predniSONE 10 MG tablet Commonly known as:  DELTASONE Take 1 tablet (10 mg total) by mouth daily with breakfast.   rivaroxaban 20 MG Tabs tablet Commonly known as:  XARELTO Take 1 tablet (20 mg total) by mouth daily with supper.   senna-docusate 8.6-50 MG tablet Commonly known as:  Senokot-S Take 1 tablet by mouth at bedtime. What changed:    when to take this  reasons to take this   tiotropium 18 MCG inhalation capsule Commonly known as:  SPIRIVA Place 1 capsule (18 mcg total) into inhaler and inhale daily.       Discharge Assessment: Vitals:   04/27/18 0828 04/27/18 0949  BP:  (!) 92/42  Pulse:    Resp:    Temp:    SpO2: 95%    Skin clean, dry and intact without evidence of skin break down, no evidence of skin tears noted. IV catheter discontinued intact. Site without signs and symptoms of complications - no redness or edema noted at insertion site, patient denies c/o pain - only slight tenderness at site.  Dressing with slight pressure applied.  D/c Instructions-Education: Discharge instructions given to patient/family with verbalized understanding. D/c education completed with patient/family including follow  up instructions, medication list, d/c activities limitations if indicated, with other d/c instructions as indicated by MD - patient able to verbalize understanding, all questions fully answered. Patient instructed to return to ED, call 911, or call MD for any changes in condition.  Patient escorted via [place], and D/C via [facility].  Hiram Comber, RN 04/27/2018 1:27 PM

## 2018-04-27 NOTE — Progress Notes (Signed)
Patient will DC to: [facility]  Anticipated DC date: 04/27/18 Family notified: Daughter, Chief Strategy Officer by: Glenna Fellows   Per MD patient ready for DC to [facility]. RN, patient, patient's family, and facility notified of DC. Discharge Summary and FL2 sent to facility. RN to call report prior to discharge [phone] Room 3251). DC packet on chart. Ambulance transport requested for patient.   CSW will sign off for now as social work intervention is no longer needed. Please consult [place] again if new needs arise.  Cedric Fishman, LCSW Clinical Social Worker [phone]

## 2018-04-27 NOTE — Progress Notes (Signed)
Report called to [facility].  °

## 2018-04-27 NOTE — Progress Notes (Signed)
Per [facility], they will not accept patient back. Patient's daughter will revoke Hospice (even though the patient prefers to keep it but they are unable to pay privately for SNF). Once Hospice is revoked, we will be able to start insurance authorization for SNF.   Percell Locus Lyle Niblett LCSW [phone]

## 2018-04-27 NOTE — Discharge Summary (Signed)
PATIENT DETAILS Name: [name] Age: 84 y.o. Sex: female Date of Birth: 06-01-1931 MRN: [id number]. Admitting Physician: Karmen Bongo, MD [id number], Christiane Ha, MD  Admit Date: 04/23/2018 Discharge date: 04/27/2018  Recommendations for Outpatient Follow-up:  1. Follow up with PCP in 1-2 weeks 2. Please obtain BMP/CBC in one week 3. Please follow up on the following pending results:  Admitted From:  ALF   Disposition: SNF   Home Health: No  Equipment/Devices: None  Discharge Condition: Stable  CODE STATUS: DNR  Diet recommendation:  Heart Healthy  Brief Summary: See H&P, Labs, Consult and Test reports for all details in brief, Patient is a 84 y.o. female with a history of venous thromboembolism (DVT) on Xarelto, atrial fibrillation, COPD on home O2, pacemaker implantation-followed by hospice at home-presenting to the hospital on 11/4 following a mechanical fall, she was found to have a left hip fracture.  She was subsequently admitted to the hospitalist service-orthopedics planning on left hip repair on 11/6.  See below for further details.  Brief Hospital Course: Mechanical fall resulting in a left hip fracture: Underwent closed reduction and intramedullary nail fixation on 11/6.    Postop course uncomplicated.  Has been resumed on Xarelto on 11/7.  Please ensure follow-up with orthopedics-weightbearing as tolerated.  Stable to discharge to SNF when bed available.  Recent lower extremity DVT: Per daughter-this was found at ALF sometime in September-Xarelto initially held-and started on IV heparin preoperatively-however per orthopedics okay to start Xarelto on 11/7.    Persistent AF: Paced rhythm-reviewed most recent outpatient cardiology note-although she has a chadsvasc score 5-given advanced age and risk of falls-not considered a good candidate for anticoagulation.  However she is back on Xarelto for the past few months as she was found to have a DVT.  Sick  sinus syndrome: Per outpatient cardiology note-she is 100% pacemaker dependent.  Follow.  Chronic diastolic heart failure: Euvolemic-follow volume status closely.   Severe COPD chronic hypoxic respiratory failure on home O2: Appears stable-lungs are clear-continue bronchodilators.  She appears to have severe COPD and is on home hospice.    Polymyalgia rheumatica: Stable-continue prednisone  Dementia/depression: Stable-continue Lexapro  Left leg abrasion/skin breakdown: Large area of skin breakdown in the left upper leg/lower thigh-appreciate wound care evaluation.  This was secondary to a mechanical fall.   Place Xeroform gauze and wrap in kerlex daily  Non-severe (moderate) malnutrition in context of chronic illness: Continue supplements  Procedures/Studies: 11/6>>Closed reduction and intramedullary nail fixation intertrochanteric fracture, left hip.  Discharge Diagnoses:  Principal Problem:   Closed left hip fracture, initial encounter [facility]) Active Problems:   Paroxysmal atrial fibrillation (HCC)   Hypokalemia   Anticoagulant long-term use   GOLD COPD II D   Benign essential HTN   Chronic diastolic CHF (congestive heart failure) (HCC)   Polymyalgia rheumatica (HCC)   Dementia (HCC)   Malnutrition of moderate degree   Discharge Instructions:  Activity:  WBAT   Discharge Instructions    Call MD for:  redness, tenderness, or signs of infection (pain, swelling, redness, odor or green/yellow discharge around incision site)   Complete by:  As directed    Diet - low sodium heart healthy   Complete by:  As directed    Discharge instructions   Complete by:  As directed    Follow with Primary MD  Lajean Manes, MD in 1 week  Follow with Dr Durward Fortes (Ortho) in 2 weeks  Please get a complete blood count and chemistry panel checked  by your Primary MD at your next visit, and again as instructed by your Primary MD.  Get Medicines reviewed and adjusted: Please take all  your medications with you for your next visit with your Primary MD  Laboratory/radiological data: Please request your Primary MD to go over all hospital tests and procedure/radiological results at the follow up, please ask your Primary MD to get all Hospital records sent to his/her office.  In some cases, they will be blood work, cultures and biopsy results pending at the time of your discharge. Please request that your primary care M.D. follows up on these results.  Also Note the following: If you experience worsening of your admission symptoms, develop shortness of breath, life threatening emergency, suicidal or homicidal thoughts you must seek medical attention immediately by calling 911 or calling your MD immediately  if symptoms less severe.  You must read complete instructions/literature along with all the possible adverse reactions/side effects for all the Medicines you take and that have been prescribed to you. Take any new Medicines after you have completely understood and accpet all the possible adverse reactions/side effects.   Do not drive when taking Pain medications or sleeping medications (Benzodaizepines)  Do not take more than prescribed Pain, Sleep and Anxiety Medications. It is not advisable to combine anxiety,sleep and pain medications without talking with your primary care practitioner  Special Instructions: If you have smoked or chewed Tobacco  in the last 2 yrs please stop smoking, stop any regular Alcohol  and or any Recreational drug use.  Wear Seat belts while driving.  Please note: You were cared for by a hospitalist during your hospital stay. Once you are discharged, your primary care physician will handle any further medical issues. Please note that NO REFILLS for any discharge medications will be authorized once you are discharged, as it is imperative that you return to your primary care physician (or establish a relationship with a primary care physician if you do  not have one) for your post hospital discharge needs so that they can reassess your need for medications and monitor your lab values.   Increase activity slowly   Complete by:  As directed      Allergies as of 04/27/2018   No Known Allergies     Medication List    STOP taking these medications   acetaminophen 325 MG tablet Commonly known as:  TYLENOL   morphine CONCENTRATE 10 MG/0.5ML Soln concentrated solution     TAKE these medications   albuterol (2.5 MG/3ML) 0.083% nebulizer solution Commonly known as:  PROVENTIL Take 3 mLs (2.5 mg total) by nebulization 3 (three) times daily.   ALPRAZolam 0.25 MG tablet Commonly known as:  XANAX Take 1 tablet (0.25 mg total) by mouth at bedtime.   arformoterol 15 MCG/2ML Nebu Commonly known as:  BROVANA Take 2 mLs (15 mcg total) by nebulization 2 (two) times daily.   escitalopram 10 MG tablet Commonly known as:  LEXAPRO Take 10 mg by mouth daily.   feeding supplement (ENSURE ENLIVE) Liqd Take 237 mLs by mouth 2 (two) times daily between meals.   furosemide 40 MG tablet Commonly known as:  LASIX Take 1 tablet (40 mg total) by mouth daily.   guaiFENesin-dextromethorphan 100-10 MG/5ML syrup Commonly known as:  ROBITUSSIN DM Take 10 mLs by mouth every 4 (four) hours as needed for cough.   HYDROcodone-acetaminophen 5-325 MG tablet Commonly known as:  NORCO/VICODIN Take 1 tablet by mouth every 6 (six) hours as needed for  moderate pain. What changed:  when to take this   ipratropium-albuterol 0.5-2.5 (3) MG/3ML Soln Commonly known as:  DUONEB Take 3 mLs by nebulization every 6 (six) hours as needed. What changed:  reasons to take this   LORazepam 1 MG tablet Commonly known as:  ATIVAN Take 1 tablet (1 mg total) by mouth every 6 (six) hours as needed for anxiety.   metoprolol succinate 25 MG 24 hr tablet Commonly known as:  TOPROL-XL Take 25 mg by mouth daily.   ondansetron 4 MG tablet Commonly known as:  ZOFRAN Take 4 mg  by mouth every 6 (six) hours as needed for nausea or vomiting.   OXYGEN Inhale 2 L into the lungs continuous.   predniSONE 10 MG tablet Commonly known as:  DELTASONE Take 1 tablet (10 mg total) by mouth daily with breakfast.   rivaroxaban 20 MG Tabs tablet Commonly known as:  XARELTO Take 1 tablet (20 mg total) by mouth daily with supper.   senna-docusate 8.6-50 MG tablet Commonly known as:  Senokot-S Take 1 tablet by mouth at bedtime. What changed:    when to take this  reasons to take this   tiotropium 18 MCG inhalation capsule Commonly known as:  SPIRIVA Place 1 capsule (18 mcg total) into inhaler and inhale daily.       Contact information for follow-up providers    [place], Christiane Ha, MD. Schedule an appointment as soon as possible for a visit in 1 week(s).   Specialty:  Internal Medicine Contact information: [address] [place] [address] [phone]        Croitoru, Dani Gobble, MD. Schedule an appointment as soon as possible for a visit in 2 week(s).   Specialty:  Cardiology Contact information: [address] [place] [address] [phone]        Garald Balding, MD. Schedule an appointment as soon as possible for a visit in 2 week(s).   Specialty:  Orthopedic Surgery Contact information: [place] [address] [phone]            Contact information for after-discharge care    Destination    [facility] .   Service:  Skilled Nursing Contact information: [place] [phone]                 No Known Allergies  Consultations:   neurology  Other Procedures/Studies: Dg Pelvis 1-2 Views  Result Date: 04/23/2018 CLINICAL DATA:  Fall EXAM: PELVIS - 1-2 VIEW COMPARISON:  01/13/2011 FINDINGS: There is a new comminuted intertrochanteric proximal left femur fracture with varus deformity. There is an  intramedullary rod and dynamic compression screw transfixing the previously visualized right intertrochanteric femur fracture. There is no breakage or loosening of the hardware. Osteopenia. Degenerative changes in the lower lumbar spine. IMPRESSION: New comminuted intertrochanteric proximal left femur fracture. Critical Value/emergent results were called by telephone at the time of interpretation on 04/23/2018 at 8:00 am to Pilar Plate, RN, who verbally acknowledged these results. Electronically Signed   By: Marybelle Killings M.D.   On: 04/23/2018 08:00   Ct Head Wo Contrast  Result Date: 04/23/2018 CLINICAL DATA:  Fall today.  No loss of consciousness. EXAM: CT HEAD WITHOUT CONTRAST CT CERVICAL SPINE WITHOUT CONTRAST TECHNIQUE: Multidetector CT imaging of the head and cervical spine was performed following the standard protocol without intravenous contrast. Multiplanar CT image reconstructions of the cervical spine were also generated. COMPARISON:  None. FINDINGS: CT HEAD FINDINGS Brain: Mild chronic ischemic white matter disease is noted. No mass effect or midline shift is noted. Ventricular size is within normal limits. There is no evidence of mass lesion, hemorrhage or acute infarction. Vascular: No hyperdense vessel or unexpected calcification. Skull: Normal. Negative for fracture or focal lesion. Sinuses/Orbits: No acute finding. Other: None. CT CERVICAL SPINE FINDINGS Alignment: Normal. Skull base and vertebrae: No acute fracture. No primary bone lesion or focal pathologic process. Soft tissues and spinal canal: No prevertebral fluid or swelling. No visible canal hematoma. Disc levels: Mild degenerative disc disease is noted at C3-4 C4-5 and C5-6. Upper chest: Probable scarring is noted in the right upper lobe. Other: Mild degenerative changes are seen involving posterior facet joints bilaterally. IMPRESSION: Mild chronic ischemic white matter disease. No acute intracranial abnormality seen. Mild multilevel  degenerative disc disease. No acute abnormality seen in the cervical spine. Electronically Signed   By: Marijo Conception, M.D.   On: 04/23/2018 08:28   Ct Cervical Spine Wo Contrast  Result Date: 04/23/2018 CLINICAL DATA:  Fall today.  No loss of consciousness. EXAM: CT HEAD WITHOUT CONTRAST CT CERVICAL SPINE WITHOUT CONTRAST TECHNIQUE: Multidetector CT imaging of the head and cervical spine was performed following the standard protocol without intravenous contrast. Multiplanar CT image reconstructions of the cervical spine were also generated. COMPARISON:  None. FINDINGS: CT HEAD FINDINGS Brain: Mild chronic ischemic white matter disease is noted. No mass effect or midline shift is noted. Ventricular size is within normal limits. There is no evidence of mass lesion, hemorrhage or acute infarction. Vascular: No hyperdense vessel or unexpected calcification. Skull: Normal. Negative for fracture or focal lesion. Sinuses/Orbits: No acute finding. Other: None. CT CERVICAL SPINE FINDINGS Alignment: Normal. Skull base and vertebrae: No acute fracture. No primary bone lesion or focal pathologic process. Soft tissues and spinal canal: No prevertebral fluid or swelling. No visible canal hematoma. Disc levels: Mild degenerative disc disease is noted at C3-4 C4-5 and C5-6. Upper chest: Probable scarring is noted in the right upper lobe. Other: Mild degenerative changes are seen involving posterior facet joints bilaterally. IMPRESSION: Mild chronic ischemic white matter disease. No acute intracranial abnormality seen. Mild multilevel degenerative disc disease. No acute abnormality seen in the cervical spine. Electronically Signed   By: Marijo Conception, M.D.   On: 04/23/2018 08:28   Dg C-arm 1-60 Min  Result Date: 04/25/2018 CLINICAL DATA:  Hip fracture EXAM: LEFT FEMUR 2 VIEWS; DG C-ARM 61-120 MIN COMPARISON:  04/23/2018 FINDINGS: Three low resolution intraoperative spot views of the left hip. Total fluoroscopy time was  1 minutes 31 seconds. Intramedullary rod and distal screw fixation of the left femur for intertrochanteric fracture. IMPRESSION: Intraoperative fluoroscopic assistance provided during surgical fixation of left hip fracture Electronically Signed   By: Donavan Foil M.D.   On: 04/25/2018 16:50   Dg C-arm 1-60 Min  Result Date: 04/25/2018 CLINICAL DATA:  Hip fracture EXAM: LEFT FEMUR 2 VIEWS; DG C-ARM 61-120 MIN COMPARISON:  04/23/2018 FINDINGS: Three low resolution intraoperative spot views of the left hip. Total fluoroscopy time was 1 minutes 31 seconds. Intramedullary rod and distal screw fixation of the left femur for intertrochanteric fracture. IMPRESSION: Intraoperative fluoroscopic assistance provided during surgical fixation of left hip fracture Electronically Signed   By: Donavan Foil M.D.   On: 04/25/2018 16:50   Dg Femur Min 2 Views Left  Result Date: 04/25/2018 CLINICAL DATA:  Hip fracture EXAM: LEFT FEMUR 2 VIEWS; DG C-ARM 61-120  MIN COMPARISON:  04/23/2018 FINDINGS: Three low resolution intraoperative spot views of the left hip. Total fluoroscopy time was 1 minutes 31 seconds. Intramedullary rod and distal screw fixation of the left femur for intertrochanteric fracture. IMPRESSION: Intraoperative fluoroscopic assistance provided during surgical fixation of left hip fracture Electronically Signed   By: Donavan Foil M.D.   On: 04/25/2018 16:50   Dg Femur Min 2 Views Left  Result Date: 04/23/2018 CLINICAL DATA:  [place] this morning.  Left hip and femur pain. EXAM: LEFT FEMUR 2 VIEWS COMPARISON:  12/28/2016 and 04/23/2018 FINDINGS: Displaced left femoral intertrochanteric fracture. Visualized left pelvic bone structures are intact. Left hip is located. Cortical thickening in the mid and distal left femur is suggestive for old injury. Left knee is located with joint space narrowing and degenerative changes. Joint space narrowing and degenerative disease in the medial and inferior left hip joint.  IMPRESSION: Displaced left femoral intertrochanteric fracture. Degenerative changes in the left hip and left knee. Electronically Signed   By: Markus Daft M.D.   On: 04/23/2018 08:02      TODAY-DAY OF DISCHARGE:  Subjective:   [place] today has no headache,no chest abdominal pain,no new weakness tingling or numbness, feels much better wants to go home today.   Objective:   Blood pressure (!) 92/42, pulse 87, temperature 98.8 F (37.1 C), temperature source Oral, resp. rate 18, height 5\' 5"  (1.651 m), weight 56.2 kg, SpO2 95 %.  Intake/Output Summary (Last 24 hours) at 04/27/2018 1016 Last data filed at 04/27/2018 0752 Gross per 24 hour  Intake 360 ml  Output 550 ml  Net -190 ml   Filed Weights   04/23/18 0639  Weight: 56.2 kg    Exam: Awake Alert, Oriented *3, No new F.N deficits, Normal affect [place].AT,PERRAL Supple Neck,No JVD, No cervical lymphadenopathy appriciated.  Symmetrical Chest wall movement, Good air movement bilaterally, CTAB RRR,No Gallops,Rubs or new Murmurs, No Parasternal Heave +ve B.Sounds, Abd Soft, Non tender, No organomegaly appriciated, No rebound -guarding or rigidity. No Cyanosis, Clubbing or edema, No new Rash or bruise   PERTINENT RADIOLOGIC STUDIES: Dg Pelvis 1-2 Views  Result Date: 04/23/2018 CLINICAL DATA:  Fall EXAM: PELVIS - 1-2 VIEW COMPARISON:  01/13/2011 FINDINGS: There is a new comminuted intertrochanteric proximal left femur fracture with varus deformity. There is an intramedullary rod and dynamic compression screw transfixing the previously visualized right intertrochanteric femur fracture. There is no breakage or loosening of the hardware. Osteopenia. Degenerative changes in the lower lumbar spine. IMPRESSION: New comminuted intertrochanteric proximal left femur fracture. Critical Value/emergent results were called by telephone at the time of interpretation on 04/23/2018 at 8:00 am to Pilar Plate, RN, who verbally acknowledged these  results. Electronically Signed   By: Marybelle Killings M.D.   On: 04/23/2018 08:00   Ct Head Wo Contrast  Result Date: 04/23/2018 CLINICAL DATA:  Fall today.  No loss of consciousness. EXAM: CT HEAD WITHOUT CONTRAST CT CERVICAL SPINE WITHOUT CONTRAST TECHNIQUE: Multidetector CT imaging of the head and cervical spine was performed following the standard protocol without intravenous contrast. Multiplanar CT image reconstructions of the cervical spine were also generated. COMPARISON:  None. FINDINGS: CT HEAD FINDINGS Brain: Mild chronic ischemic white matter disease is noted. No mass effect or midline shift is noted. Ventricular size is within normal limits. There is no evidence of mass lesion, hemorrhage or acute infarction. Vascular: No hyperdense vessel or unexpected calcification. Skull: Normal. Negative for fracture or focal lesion. Sinuses/Orbits: No acute finding. Other: None.  CT CERVICAL SPINE FINDINGS Alignment: Normal. Skull base and vertebrae: No acute fracture. No primary bone lesion or focal pathologic process. Soft tissues and spinal canal: No prevertebral fluid or swelling. No visible canal hematoma. Disc levels: Mild degenerative disc disease is noted at C3-4 C4-5 and C5-6. Upper chest: Probable scarring is noted in the right upper lobe. Other: Mild degenerative changes are seen involving posterior facet joints bilaterally. IMPRESSION: Mild chronic ischemic white matter disease. No acute intracranial abnormality seen. Mild multilevel degenerative disc disease. No acute abnormality seen in the cervical spine. Electronically Signed   By: Marijo Conception, M.D.   On: 04/23/2018 08:28   Ct Cervical Spine Wo Contrast  Result Date: 04/23/2018 CLINICAL DATA:  Fall today.  No loss of consciousness. EXAM: CT HEAD WITHOUT CONTRAST CT CERVICAL SPINE WITHOUT CONTRAST TECHNIQUE: Multidetector CT imaging of the head and cervical spine was performed following the standard protocol without intravenous contrast.  Multiplanar CT image reconstructions of the cervical spine were also generated. COMPARISON:  None. FINDINGS: CT HEAD FINDINGS Brain: Mild chronic ischemic white matter disease is noted. No mass effect or midline shift is noted. Ventricular size is within normal limits. There is no evidence of mass lesion, hemorrhage or acute infarction. Vascular: No hyperdense vessel or unexpected calcification. Skull: Normal. Negative for fracture or focal lesion. Sinuses/Orbits: No acute finding. Other: None. CT CERVICAL SPINE FINDINGS Alignment: Normal. Skull base and vertebrae: No acute fracture. No primary bone lesion or focal pathologic process. Soft tissues and spinal canal: No prevertebral fluid or swelling. No visible canal hematoma. Disc levels: Mild degenerative disc disease is noted at C3-4 C4-5 and C5-6. Upper chest: Probable scarring is noted in the right upper lobe. Other: Mild degenerative changes are seen involving posterior facet joints bilaterally. IMPRESSION: Mild chronic ischemic white matter disease. No acute intracranial abnormality seen. Mild multilevel degenerative disc disease. No acute abnormality seen in the cervical spine. Electronically Signed   By: Marijo Conception, M.D.   On: 04/23/2018 08:28   Dg C-arm 1-60 Min  Result Date: 04/25/2018 CLINICAL DATA:  Hip fracture EXAM: LEFT FEMUR 2 VIEWS; DG C-ARM 61-120 MIN COMPARISON:  04/23/2018 FINDINGS: Three low resolution intraoperative spot views of the left hip. Total fluoroscopy time was 1 minutes 31 seconds. Intramedullary rod and distal screw fixation of the left femur for intertrochanteric fracture. IMPRESSION: Intraoperative fluoroscopic assistance provided during surgical fixation of left hip fracture Electronically Signed   By: Donavan Foil M.D.   On: 04/25/2018 16:50   Dg C-arm 1-60 Min  Result Date: 04/25/2018 CLINICAL DATA:  Hip fracture EXAM: LEFT FEMUR 2 VIEWS; DG C-ARM 61-120 MIN COMPARISON:  04/23/2018 FINDINGS: Three low resolution  intraoperative spot views of the left hip. Total fluoroscopy time was 1 minutes 31 seconds. Intramedullary rod and distal screw fixation of the left femur for intertrochanteric fracture. IMPRESSION: Intraoperative fluoroscopic assistance provided during surgical fixation of left hip fracture Electronically Signed   By: Donavan Foil M.D.   On: 04/25/2018 16:50   Dg Femur Min 2 Views Left  Result Date: 04/25/2018 CLINICAL DATA:  Hip fracture EXAM: LEFT FEMUR 2 VIEWS; DG C-ARM 61-120 MIN COMPARISON:  04/23/2018 FINDINGS: Three low resolution intraoperative spot views of the left hip. Total fluoroscopy time was 1 minutes 31 seconds. Intramedullary rod and distal screw fixation of the left femur for intertrochanteric fracture. IMPRESSION: Intraoperative fluoroscopic assistance provided during surgical fixation of left hip fracture Electronically Signed   By: Donavan Foil M.D.   On:  04/25/2018 16:50   Dg Femur Min 2 Views Left  Result Date: 04/23/2018 CLINICAL DATA:  [place] this morning.  Left hip and femur pain. EXAM: LEFT FEMUR 2 VIEWS COMPARISON:  12/28/2016 and 04/23/2018 FINDINGS: Displaced left femoral intertrochanteric fracture. Visualized left pelvic bone structures are intact. Left hip is located. Cortical thickening in the mid and distal left femur is suggestive for old injury. Left knee is located with joint space narrowing and degenerative changes. Joint space narrowing and degenerative disease in the medial and inferior left hip joint. IMPRESSION: Displaced left femoral intertrochanteric fracture. Degenerative changes in the left hip and left knee. Electronically Signed   By: Markus Daft M.D.   On: 04/23/2018 08:02     PERTINENT LAB RESULTS: CBC: Recent Labs    04/25/18 0341 04/26/18 0459  WBC 11.0* 11.4*  HGB 10.8* 9.8*  HCT 35.8* 33.2*  PLT 290 318   CMET CMP     Component Value Date/Time   NA 137 04/26/2018 0459   K 3.7 04/26/2018 0459   CL 97 (L) 04/26/2018 0459   CO2 31  04/26/2018 0459   GLUCOSE 132 (H) 04/26/2018 0459   BUN 12 04/26/2018 0459   CREATININE 0.73 04/26/2018 0459   CREATININE 0.84 11/04/2015 1522   CALCIUM 8.4 (L) 04/26/2018 0459   PROT 6.8 03/26/2018 0818   ALBUMIN 2.8 (L) 03/26/2018 0818   AST 19 03/26/2018 0818   ALT 14 03/26/2018 0818   ALKPHOS 85 03/26/2018 0818   BILITOT 1.3 (H) 03/26/2018 0818   GFRNONAA >60 04/26/2018 0459   GFRAA >60 04/26/2018 0459    GFR Estimated Creatinine Clearance: 45.6 mL/min (by C-G formula based on SCr of 0.73 mg/dL). No results for input(s): LIPASE, AMYLASE in the last 72 hours. No results for input(s): CKTOTAL, CKMB, CKMBINDEX, TROPONINI in the last 72 hours. Invalid input(s): POCBNP No results for input(s): DDIMER in the last 72 hours. No results for input(s): HGBA1C in the last 72 hours. No results for input(s): CHOL, HDL, LDLCALC, TRIG, CHOLHDL, LDLDIRECT in the last 72 hours. No results for input(s): TSH, T4TOTAL, T3FREE, THYROIDAB in the last 72 hours.  Invalid input(s): FREET3 No results for input(s): VITAMINB12, FOLATE, FERRITIN, TIBC, IRON, RETICCTPCT in the last 72 hours. Coags: No results for input(s): INR in the last 72 hours.  Invalid input(s): PT Microbiology: Recent Results (from the past 240 hour(s))  Surgical pcr screen     Status: None   Collection Time: 04/23/18  9:42 PM  Result Value Ref Range Status   MRSA, PCR NEGATIVE NEGATIVE Final   Staphylococcus aureus NEGATIVE NEGATIVE Final    Comment: (NOTE) The Xpert SA Assay (FDA approved for NASAL specimens in patients 60 years of age and older), is one component of a comprehensive surveillance program. It is not intended to diagnose infection nor to guide or monitor treatment. Performed at [facility], [place] [address] [place], [place] [address]     FURTHER DISCHARGE INSTRUCTIONS:  Get Medicines reviewed and adjusted: Please take all your medications with you for your next visit with your Primary  MD  Laboratory/radiological data: Please request your Primary MD to go over all hospital tests and procedure/radiological results at the follow up, please ask your Primary MD to get all Hospital records sent to his/her office.  In some cases, they will be blood work, cultures and biopsy results pending at the time of your discharge. Please request that your primary care M.D. goes through all the records of your  hospital data and follows up on these results.  Also Note the following: If you experience worsening of your admission symptoms, develop shortness of breath, life threatening emergency, suicidal or homicidal thoughts you must seek medical attention immediately by calling 911 or calling your MD immediately  if symptoms less severe.  You must read complete instructions/literature along with all the possible adverse reactions/side effects for all the Medicines you take and that have been prescribed to you. Take any new Medicines after you have completely understood and accpet all the possible adverse reactions/side effects.   Do not drive when taking Pain medications or sleeping medications (Benzodaizepines)  Do not take more than prescribed Pain, Sleep and Anxiety Medications. It is not advisable to combine anxiety,sleep and pain medications without talking with your primary care practitioner  Special Instructions: If you have smoked or chewed Tobacco  in the last 2 yrs please stop smoking, stop any regular Alcohol  and or any Recreational drug use.  Wear Seat belts while driving.  Please note: You were cared for by a hospitalist during your hospital stay. Once you are discharged, your primary care physician will handle any further medical issues. Please note that NO REFILLS for any discharge medications will be authorized once you are discharged, as it is imperative that you return to your primary care physician (or establish a relationship with a primary care physician if you do not have  one) for your post hospital discharge needs so that they can reassess your need for medications and monitor your lab values.  Total Time spent coordinating discharge including counseling, education and face to face time equals 35 minutes.  SignedOren Binet 04/27/2018 10:16 AM

## 2018-04-27 NOTE — Progress Notes (Signed)
Subjective: 2 Days Post-Op Procedure(s) (LRB): intramedullary nail intertrochanteric left hip (Left) Patient reports pain as mild at rest    Objective: Vital signs in last 24 hours: Temp:  [97.4 F (36.3 C)-98.8 F (37.1 C)] 98.8 F (37.1 C) (11/08 0547) Pulse Rate:  [74-76] 75 (11/08 0547) Resp:  [17-18] 18 (11/08 0547) BP: (101-111)/(53-59) 111/53 (11/08 0547) SpO2:  [90 %-98 %] 90 % (11/08 0547)  Intake/Output from previous day: 11/07 0701 - 11/08 0700 In: -  Out: 550 [Urine:550] Intake/Output this shift: No intake/output data recorded.  Recent Labs    04/25/18 0341 04/26/18 0459  HGB 10.8* 9.8*   Recent Labs    04/25/18 0341 04/26/18 0459  WBC 11.0* 11.4*  RBC 4.00 3.69*  HCT 35.8* 33.2*  PLT 290 318   Recent Labs    04/25/18 0341 04/26/18 0459  NA 134* 137  K 3.6 3.7  CL 95* 97*  CO2 32 31  BUN 13 12  CREATININE 0.72 0.73  GLUCOSE 103* 132*  CALCIUM 8.4* 8.4*   No results for input(s): LABPT, INR in the last 72 hours.  Neurologically intact No cellulitis present Compartment soft    Assessment/Plan: 2 Days Post-Op Procedure(s) (LRB): intramedullary nail intertrochanteric left hip (Left) Up with therapy Discharge to SNF Dressing changed LLE- wounds clean. Areas of skin avulsion stable and dry.OOB as tolerated. Awaiting placement   Garald Balding 04/27/2018, 7:26 AM

## 2018-04-28 LAB — TYPE AND SCREEN
ABO/RH(D): A NEG
Antibody Screen: POSITIVE
DONOR AG TYPE: NEGATIVE
Donor AG Type: NEGATIVE
UNIT DIVISION: 0
UNIT DIVISION: 0

## 2018-04-28 LAB — BPAM RBC
BLOOD PRODUCT EXPIRATION DATE: 201911142359
Blood Product Expiration Date: 201911222359
ISSUE DATE / TIME: 201910311322
Unit Type and Rh: 600
Unit Type and Rh: 600

## 2018-04-30 DIAGNOSIS — S72102D Unspecified trochanteric fracture of left femur, subsequent encounter for closed fracture with routine healing: Secondary | ICD-10-CM | POA: Diagnosis not present

## 2018-04-30 DIAGNOSIS — Z86718 Personal history of other venous thrombosis and embolism: Secondary | ICD-10-CM | POA: Diagnosis not present

## 2018-04-30 DIAGNOSIS — I5032 Chronic diastolic (congestive) heart failure: Secondary | ICD-10-CM | POA: Diagnosis not present

## 2018-04-30 DIAGNOSIS — J449 Chronic obstructive pulmonary disease, unspecified: Secondary | ICD-10-CM | POA: Diagnosis not present

## 2018-05-03 ENCOUNTER — Non-Acute Institutional Stay: Payer: BLUE CROSS/BLUE SHIELD | Admitting: Internal Medicine

## 2018-05-03 ENCOUNTER — Encounter: Payer: Self-pay | Admitting: Internal Medicine

## 2018-05-03 ENCOUNTER — Telehealth: Payer: Self-pay | Admitting: Internal Medicine

## 2018-05-03 VITALS — Ht 65.0 in | Wt 123.4 lb

## 2018-05-03 DIAGNOSIS — Z7189 Other specified counseling: Secondary | ICD-10-CM

## 2018-05-03 DIAGNOSIS — R634 Abnormal weight loss: Secondary | ICD-10-CM | POA: Diagnosis not present

## 2018-05-03 DIAGNOSIS — R531 Weakness: Secondary | ICD-10-CM | POA: Diagnosis not present

## 2018-05-03 DIAGNOSIS — Z515 Encounter for palliative care: Secondary | ICD-10-CM | POA: Diagnosis not present

## 2018-05-03 NOTE — Progress Notes (Signed)
[facility] Telephone: [phone] Fax: [phone]  PATIENT NAME: [name] DOB: 26-Apr-1931 MRN: [id number]  PRIMARY CARE PROVIDER:   Lajean Manes, MD [phone]  REFERRING PROVIDER:  Suzzanne Cloud PA-C RESPONSIBLE PARTY:   [name] (dtr) [phone], [name] (S-I-L) [phone] [place] 3251-A  ASSESSMENT/RECOMMENDATONS:     1. Weakness: Generalized from debilitation from recent hospitalization. Ongoing facility rehab. Has transferred OOB into chair so far.   2. Weight loss:  Weight (05/01/2018) is 123.4lbs, a loss of 13.6lbs. Height 5'5" for a BMI of 20.5kg/m2. Her Serum Albumin  (04/28/2018) was 2.6 (from previous 2.8).  -Maximize supplements.  3. Advance Care Planning: DNR and MOST form present on chart. MOST details: Limited scope of additional interventions (yes to BiPap, CPAP, Yes to IV antibiotics and fluids, no feeding tube.   4. Goals of Care: I spoke with [name] via TC [phone]). [name] wishes her mother to return to [facility] ASAP, with anticipated resumption of hospice services. [name] understanding is that [facility] will accept patient back when she is able to weight bear. Her baseline was independent transfer with walker. [name] is, however, open to continuing SNF rehab up to end of patient's benefit period pending patient's progress. I discussed this with [name], who wishes to defer all those decisions to daughter.   -Will follow along.   I spent  45  minutes providing this consultation,  from 2:30pm to 3:83m. More than 50% of the time in this consultation was spent coordinating communication.   HISTORY OF PRESENT ILLNESS: [name] is an 84 y.o. female with a history of LE DVT (since Sept 2019, Xarelto), atrial fibrillation, COPD (home O2), pacemaker implantation (100% dependent), diastolic heart failure, dementia/depression, moderate malnutrition (of chronic illness), and Polymyalgia rheumatica  (prednisone).  She was a resident of [facility] for about 16 minths, and had been admitted to hospice services Mar 29, 2018 (CTI COPD). She is s/p recent hospitalization (11/4-11/01/2018)  following a mechanical fall and subsequent left hip fracture. She underwent a closed reduction and intramedullary nail fixation, and was discharged without resumption of hospice services for rehab at [facility] was consulted for advance care planning discussion, symptom management, determination of goals of care for patient and family, and end-of-life decision making.    CODE STATUS: DNR. MOST: avoid ICU, no intubation, no tube feed.  PPS: 30% HOSPICE ELIGIBILITY/DIAGNOSIS: yes, pending completion of rehab stay  PAST MEDICAL HISTORY:  Past Medical History:  Diagnosis Date  . CHB (complete heart block) ([place]) 07/13/2013   Pacemaker dependent  . CHF (congestive heart failure) ([place])   . COPD (chronic obstructive pulmonary disease) ([place])    on 3L home O2  . DM (dermatomyositis)   . Orthostatic hypotension   . Pacemaker 07/13/2013   Her original pacemaker and the current leads were implanted in 1992. She has had 2 generator change out, most recently in 2010. Her device is a Buyer, retail 2110 non RF dual-chamber pacemaker with a battery longevity estimated at about 7 years. The atrial lead is a St. Jude [id number] and the ventricular lead was a Biotronik [id number].   [name] Paroxysmal atrial fibrillation ([place]) 09/04/2014   on Eliquis  . Polymyalgia rheumatica ([place])   . Scoliosis   . SSS (sick sinus syndrome) ([place]) 07/13/2013  . Syncope   . Systemic hypertension   . Thoracic kyphosis     SOCIAL HX:  Social History   Tobacco  Use  . Smoking status: Former Smoker    Packs/day: 0.50    Years: 60.00    Pack years: 30.00    Types: Cigarettes  . Smokeless tobacco: Never Used  . Tobacco comment: quit 08/2017  Substance Use Topics  . Alcohol use: No    ALLERGIES: No Known Allergies    PERTINENT MEDICATIONS:  Outpatient Encounter Medications as of 05/03/2018  Medication Sig  . albuterol (PROVENTIL) (2.5 MG/3ML) 0.083% nebulizer solution Take 3 mLs (2.5 mg total) by nebulization 3 (three) times daily.  [name] ALPRAZolam (XANAX) 0.25 MG tablet Take 1 tablet (0.25 mg total) by mouth at bedtime.  [name] arformoterol (BROVANA) 15 MCG/2ML NEBU Take 2 mLs (15 mcg total) by nebulization 2 (two) times daily.  [name] escitalopram (LEXAPRO) 10 MG tablet Take 10 mg by mouth daily.  . feeding supplement, ENSURE ENLIVE, (ENSURE ENLIVE) LIQD Take 237 mLs by mouth 2 (two) times daily between meals.  . furosemide (LASIX) 40 MG tablet Take 1 tablet (40 mg total) by mouth daily.  [name] guaiFENesin-dextromethorphan (ROBITUSSIN DM) 100-10 MG/5ML syrup Take 10 mLs by mouth every 4 (four) hours as needed for cough.  [name] HYDROcodone-acetaminophen (NORCO/VICODIN) 5-325 MG tablet Take 1 tablet by mouth every 6 (six) hours as needed for moderate pain.  [name] ipratropium-albuterol (DUONEB) 0.5-2.5 (3) MG/3ML SOLN Take 3 mLs by nebulization every 6 (six) hours as needed. (Patient taking differently: Take 3 mLs by nebulization every 6 (six) hours as needed (spasms of Lung passages). )  . LORazepam (ATIVAN) 1 MG tablet Take 1 tablet (1 mg total) by mouth every 6 (six) hours as needed for anxiety.  . metoprolol succinate (TOPROL-XL) 25 MG 24 hr tablet Take 25 mg by mouth daily.   . ondansetron (ZOFRAN) 4 MG tablet Take 4 mg by mouth every 6 (six) hours as needed for nausea or vomiting.  . OXYGEN Inhale 2 L into the lungs continuous.  . predniSONE (DELTASONE) 10 MG tablet Take 1 tablet (10 mg total) by mouth daily with breakfast.  . rivaroxaban (XARELTO) 20 MG TABS tablet Take 1 tablet (20 mg total) by mouth daily with supper.  . senna-docusate (SENOKOT-S) 8.6-50 MG tablet Take 1 tablet by mouth at bedtime. (Patient taking differently: Take 1 tablet by mouth at bedtime as needed for mild constipation. )  . tiotropium (SPIRIVA  HANDIHALER) 18 MCG inhalation capsule Place 1 capsule (18 mcg total) into inhaler and inhale daily.   No facility-administered encounter medications on file as of 05/03/2018.     PHYSICAL EXAM:  Pleasantly conversant, thin Caucasian female lying 30 degrees elevation in bed. A & O x 3 but needing a little prompting for name of facility.  Cardiovascular: regular rate and rhythm Pulmonary: clear ant fields Abdomen: soft, nontender, + bowel sounds GU: no suprapubic tenderness Extremities: no edema, distal pulses intact Skin: multi areas of bruising and skin tears. Dressings intact. Dressing intact over surgical incision left hip. Neurological: Weakness but otherwise nonfocal  Julianne Handler, NP

## 2018-05-04 DIAGNOSIS — F0390 Unspecified dementia without behavioral disturbance: Secondary | ICD-10-CM | POA: Diagnosis not present

## 2018-05-04 DIAGNOSIS — M353 Polymyalgia rheumatica: Secondary | ICD-10-CM | POA: Diagnosis not present

## 2018-05-04 DIAGNOSIS — S72002A Fracture of unspecified part of neck of left femur, initial encounter for closed fracture: Secondary | ICD-10-CM | POA: Diagnosis not present

## 2018-05-04 DIAGNOSIS — I82401 Acute embolism and thrombosis of unspecified deep veins of right lower extremity: Secondary | ICD-10-CM | POA: Diagnosis not present

## 2018-05-04 DIAGNOSIS — I4891 Unspecified atrial fibrillation: Secondary | ICD-10-CM | POA: Diagnosis not present

## 2018-05-04 DIAGNOSIS — E46 Unspecified protein-calorie malnutrition: Secondary | ICD-10-CM | POA: Diagnosis not present

## 2018-05-04 DIAGNOSIS — J449 Chronic obstructive pulmonary disease, unspecified: Secondary | ICD-10-CM | POA: Diagnosis not present

## 2018-05-04 DIAGNOSIS — J9611 Chronic respiratory failure with hypoxia: Secondary | ICD-10-CM | POA: Diagnosis not present

## 2018-05-04 DIAGNOSIS — S81802A Unspecified open wound, left lower leg, initial encounter: Secondary | ICD-10-CM | POA: Diagnosis not present

## 2018-05-08 DIAGNOSIS — I5032 Chronic diastolic (congestive) heart failure: Secondary | ICD-10-CM | POA: Diagnosis not present

## 2018-05-08 DIAGNOSIS — I4811 Longstanding persistent atrial fibrillation: Secondary | ICD-10-CM | POA: Diagnosis not present

## 2018-05-08 DIAGNOSIS — S72102D Unspecified trochanteric fracture of left femur, subsequent encounter for closed fracture with routine healing: Secondary | ICD-10-CM | POA: Diagnosis not present

## 2018-05-08 DIAGNOSIS — J449 Chronic obstructive pulmonary disease, unspecified: Secondary | ICD-10-CM | POA: Diagnosis not present

## 2018-05-09 ENCOUNTER — Other Ambulatory Visit: Payer: Self-pay | Admitting: *Deleted

## 2018-05-09 ENCOUNTER — Telehealth (INDEPENDENT_AMBULATORY_CARE_PROVIDER_SITE_OTHER): Payer: Self-pay | Admitting: Orthopaedic Surgery

## 2018-05-09 DIAGNOSIS — S81802D Unspecified open wound, left lower leg, subsequent encounter: Secondary | ICD-10-CM | POA: Diagnosis not present

## 2018-05-09 DIAGNOSIS — S81802A Unspecified open wound, left lower leg, initial encounter: Secondary | ICD-10-CM | POA: Diagnosis not present

## 2018-05-09 NOTE — Patient Outreach (Signed)
[facility]) Care Management  05/09/2018  [name] 1930/12/18 [id number]   Onsite IDT meeting. SW reports patient from ALF-[facility] and the plan is to return upon discharge.  Patient will also resume hospice services upon discharge. Palliative care is following at facility.  Briefly met with patient, left RNCM card for daughter. No [facility] care management needs anticipated. Will sign off. Royetta Crochet. [name], RN, BSN, [place] [phone]) Business Cell  [phone]) Toll Free Office

## 2018-05-09 NOTE — Telephone Encounter (Signed)
Patients staples look like they are ready to come out. Question if their doctor at the residence can take those out? Patient has a rov scheduled for Monday 11/25 here. Please call today to advise.

## 2018-05-10 NOTE — Telephone Encounter (Signed)
Will remove on Monday

## 2018-05-10 NOTE — Telephone Encounter (Signed)
I called daughter

## 2018-05-10 NOTE — Telephone Encounter (Signed)
Please advise 

## 2018-05-14 ENCOUNTER — Ambulatory Visit (INDEPENDENT_AMBULATORY_CARE_PROVIDER_SITE_OTHER): Payer: Self-pay

## 2018-05-14 ENCOUNTER — Encounter (INDEPENDENT_AMBULATORY_CARE_PROVIDER_SITE_OTHER): Payer: Self-pay | Admitting: Orthopaedic Surgery

## 2018-05-14 ENCOUNTER — Ambulatory Visit (INDEPENDENT_AMBULATORY_CARE_PROVIDER_SITE_OTHER): Payer: Medicare Other | Admitting: Orthopaedic Surgery

## 2018-05-14 VITALS — Ht 66.0 in | Wt 113.0 lb

## 2018-05-14 DIAGNOSIS — M25552 Pain in left hip: Secondary | ICD-10-CM

## 2018-05-14 NOTE — Progress Notes (Signed)
Office Visit Note   Patient: [name]           Date of Birth: 1930-09-17           MRN: [id number] Visit Date: 05/14/2018              Requested by: Lajean Manes, MD [address] [place], [place] [address] PCP: Lajean Manes, MD   Assessment & Plan: Visit Diagnoses:  1. Pain in left hip     Plan: 3 weeks status post closed reduction intramedullary nailing left hip fracture.  Accompanied by her daughter.  Presently rehabbing at [facility].  Very weak.  Minimal attempted ambulation.  No significant pain at rest or with motion of her hip.  Surgical clips removed.  Being followed by wound care specialist for her skin avulsions office 1 month  Follow-Up Instructions: Return in about 1 month (around 06/13/2018).   Orders:  No orders of the defined types were placed in this encounter.  No orders of the defined types were placed in this encounter.     Procedures: No procedures performed   Clinical Data: No additional findings.   Subjective: Chief Complaint  Patient presents with  . Left Hip - Routine Post Op    04/25/18 Intramedullary Nail Intertrochanteric Left Hip  Patient presents for her first post op visit. She is status post IM Nail Intertrochanteric Left Hip on 04/25/18.  She is doing well. She has had a little physical therapy at [facility] where she is staying. She denies pain.  She is given hydrocodone prior to physical therapy daily and has it before bed sometimes. Denies any fever or chills.  No significant pain at rest.  Excellent position of internal fixation device and IM nail postop.  I did not obtain films today as she was uncomfortable just touching her skin HPI  Review of Systems   Objective: Vital Signs: Ht 5\' 6"  (1.676 m)   Wt 113 lb (51.3 kg)   BMI 18.24 kg/m   Physical Exam  Ortho Exam evaluate in a wheelchair.  We carefully had her stand or hold onto a walker.  I remove the clips from her left hip.  Wounds are healing  without problem.  I did not evaluate her skin avulsion areas as she was very uncomfortable and is being followed by a wound specialist at [facility].  Would like to see her again in 4 weeks and obtain films  Specialty Comments:  No specialty comments available.  Imaging: No results found.   PMFS History: Patient Active Problem List   Diagnosis Date Noted  . Malnutrition of moderate degree 04/25/2018  . Closed left hip fracture, initial encounter ([place]) 04/23/2018  . Palliative care by specialist   . DNR (do not resuscitate)   . Acute on chronic respiratory failure with hypoxia ([place]) 03/26/2018  . Weakness generalized 03/21/2018  . Right middle lobe pneumonia ([place]) 11/20/2017  . Elective replacement indicated for cardiac pacemaker battery at end of lifespan 08/09/2017  . Chronic pulmonary aspiration 05/02/2017  . Encounter for palliative care   . Goals of care, counseling/discussion   . Dyspnea   . Stress fracture of thoracic vertebra 03/19/2017  . Dementia ([place]) 03/19/2017  . Non-traumatic compression fracture of T9 thoracic vertebra 01/02/2017  . Polymyalgia rheumatica ([place])   . Postural dizziness with near syncope   . Syncope 10/11/2016  . Postural dizziness with presyncope 10/11/2016  . Chronic diastolic CHF (congestive heart failure) ([place]) 10/05/2016  . Smoker  10/05/2016  . Closed multiple fractures of right upper extremity with ribs with routine healing 10/05/2016  . HCAP (healthcare-associated pneumonia) 10/31/2015  . GOLD COPD II D 10/31/2015  . Leukocytosis 10/31/2015  . Benign essential HTN 10/31/2015  . Anxiety state   . COPD exacerbation ([place]) 08/26/2015  . Hypokalemia 08/26/2015  . Anticoagulant long-term use 08/26/2015  . DM (dermatomyositis) 08/26/2015  . Acute on chronic diastolic CHF (congestive heart failure), NYHA class 1 ([place])   . Sepsis ([place]) 06/07/2015  . CAP (community acquired pneumonia) 06/07/2015  . Paroxysmal atrial fibrillation ([place])  09/04/2014  . Pacemaker 07/13/2013  . CHB (complete heart block) ([place]) 07/13/2013  . SSS (sick sinus syndrome) ([place]) 07/13/2013  . Orthostatic hypotension 07/13/2013  . Aortic insufficiency 07/13/2013   Past Medical History:  Diagnosis Date  . CHB (complete heart block) ([place]) 07/13/2013   Pacemaker dependent  . CHF (congestive heart failure) ([place])   . COPD (chronic obstructive pulmonary disease) ([place])    on 3L home O2  . DM (dermatomyositis)   . Orthostatic hypotension   . Pacemaker 07/13/2013   Her original pacemaker and the current leads were implanted in 1992. She has had 2 generator change out, most recently in 2010. Her device is a Buyer, retail 2110 non RF dual-chamber pacemaker with a battery longevity estimated at about 7 years. The atrial lead is a St. Jude 6967 and the ventricular lead was a Biotronik [id number].   [name] Paroxysmal atrial fibrillation ([place]) 09/04/2014   on Eliquis  . Polymyalgia rheumatica ([place])   . Scoliosis   . SSS (sick sinus syndrome) ([place]) 07/13/2013  . Syncope   . Systemic hypertension   . Thoracic kyphosis     Family History  Problem Relation Age of Onset  . Stroke Mother     Past Surgical History:  Procedure Laterality Date  . INTRAMEDULLARY (IM) NAIL INTERTROCHANTERIC Left 04/25/2018   Procedure: intramedullary nail intertrochanteric left hip;  Surgeon: Garald Balding, MD;  Location: [facility];  Service: Orthopedics;  Laterality: Left;  . IR KYPHO THORACIC WITH BONE BIOPSY  01/04/2017  . IR RADIOLOGIST EVAL & MGMT  01/10/2017  . NM MYOCAR PERF WALL MOTION  09/13/2011   Low risk  . PERMANENT PACEMAKER GENERATOR CHANGE  03/27/2009   St.Jude  . PPM GENERATOR CHANGEOUT N/A 08/09/2017   Procedure: PPM GENERATOR CHANGEOUT;  Surgeon: Sanda Klein, MD;  Location: [facility];  Service: Cardiovascular;  Laterality: N/A;  . US ECHOCARDIOGRAPHY  03/28/2012   Mod LAE,mild MR,aortic sclerosis w/mod AI,mod. TR,mild PI,Stage I diastolic dysfunction  . VIDEO  BRONCHOSCOPY Bilateral 10/07/2016   Procedure: VIDEO BRONCHOSCOPY WITH FLUORO;  Surgeon: Marshell Garfinkel, MD;  Location: [facility];  Service: Cardiopulmonary;  Laterality: Bilateral;   Social History   Occupational History  . Occupation: retired  Tobacco Use  . Smoking status: Former Smoker    Packs/day: 0.50    Years: 60.00    Pack years: 30.00    Types: Cigarettes  . Smokeless tobacco: Never Used  . Tobacco comment: quit 08/2017  Substance and Sexual Activity  . Alcohol use: No  . Drug use: No  . Sexual activity: Not Currently    Partners: Female

## 2018-05-15 DIAGNOSIS — S72102D Unspecified trochanteric fracture of left femur, subsequent encounter for closed fracture with routine healing: Secondary | ICD-10-CM | POA: Diagnosis not present

## 2018-05-15 DIAGNOSIS — I5032 Chronic diastolic (congestive) heart failure: Secondary | ICD-10-CM | POA: Diagnosis not present

## 2018-05-15 DIAGNOSIS — J449 Chronic obstructive pulmonary disease, unspecified: Secondary | ICD-10-CM | POA: Diagnosis not present

## 2018-05-15 DIAGNOSIS — I4811 Longstanding persistent atrial fibrillation: Secondary | ICD-10-CM | POA: Diagnosis not present

## 2018-05-16 DIAGNOSIS — S81802D Unspecified open wound, left lower leg, subsequent encounter: Secondary | ICD-10-CM | POA: Diagnosis not present

## 2018-05-16 DIAGNOSIS — S81802A Unspecified open wound, left lower leg, initial encounter: Secondary | ICD-10-CM | POA: Diagnosis not present

## 2018-05-22 DIAGNOSIS — S72102D Unspecified trochanteric fracture of left femur, subsequent encounter for closed fracture with routine healing: Secondary | ICD-10-CM | POA: Diagnosis not present

## 2018-05-22 DIAGNOSIS — J9611 Chronic respiratory failure with hypoxia: Secondary | ICD-10-CM | POA: Diagnosis not present

## 2018-05-22 DIAGNOSIS — J449 Chronic obstructive pulmonary disease, unspecified: Secondary | ICD-10-CM | POA: Diagnosis not present

## 2018-05-22 DIAGNOSIS — I4811 Longstanding persistent atrial fibrillation: Secondary | ICD-10-CM | POA: Diagnosis not present

## 2018-05-23 DIAGNOSIS — S81802D Unspecified open wound, left lower leg, subsequent encounter: Secondary | ICD-10-CM | POA: Diagnosis not present

## 2018-05-23 DIAGNOSIS — S81802A Unspecified open wound, left lower leg, initial encounter: Secondary | ICD-10-CM | POA: Diagnosis not present

## 2018-06-08 ENCOUNTER — Ambulatory Visit (INDEPENDENT_AMBULATORY_CARE_PROVIDER_SITE_OTHER): Payer: Medicare Other | Admitting: Orthopaedic Surgery

## 2018-06-09 LAB — CUP PACEART REMOTE DEVICE CHECK
Battery Remaining Percentage: 95.5 %
Battery Voltage: 2.98 V
Brady Statistic AP VP Percent: 84 %
Brady Statistic AP VS Percent: 1 %
Brady Statistic AS VP Percent: 16 %
Brady Statistic RV Percent Paced: 99 %
Date Time Interrogation Session: 20191017060014
Implantable Lead Implant Date: 19920213
Implantable Lead Serial Number: 23000302
Implantable Pulse Generator Implant Date: 20190220
Lead Channel Impedance Value: 410 Ohm
Lead Channel Pacing Threshold Amplitude: 0.75 V
Lead Channel Sensing Intrinsic Amplitude: 0.6 mV
Lead Channel Sensing Intrinsic Amplitude: 8.7 mV
Lead Channel Setting Pacing Amplitude: 2.5 V
Lead Channel Setting Pacing Pulse Width: 0.5 ms
Lead Channel Setting Sensing Sensitivity: 4 mV
MDC IDC LEAD IMPLANT DT: 19920213
MDC IDC LEAD LOCATION: 753859
MDC IDC LEAD LOCATION: 753860
MDC IDC MSMT BATTERY REMAINING LONGEVITY: 87 mo
MDC IDC MSMT LEADCHNL RA IMPEDANCE VALUE: 380 Ohm
MDC IDC MSMT LEADCHNL RA PACING THRESHOLD AMPLITUDE: 0.75 V
MDC IDC MSMT LEADCHNL RA PACING THRESHOLD PULSEWIDTH: 0.5 ms
MDC IDC MSMT LEADCHNL RV PACING THRESHOLD PULSEWIDTH: 0.5 ms
MDC IDC SET LEADCHNL RV PACING AMPLITUDE: 2.5 V
MDC IDC STAT BRADY AS VS PERCENT: 1 %
MDC IDC STAT BRADY RA PERCENT PACED: 30 %
Pulse Gen Model: 2272
Pulse Gen Serial Number: 8999181

## 2018-06-28 NOTE — Telephone Encounter (Signed)
[name],  A relative of pt is requesting if a PFT would be helpful understanding pt's current health status.  Please advise.  ---------------------------- FYI - please be aware this MyChart message (generated on 03/23/2018) was sent inadvertently to the incorrect pool and therefor was not able to be seen by Pulmonary to be addressed. Pt was seen last by Wyn Quaker, NP, on 03/21/18 for a primary dx for pulmonary emphysema (Dr. Vaughan Browner pt).

## 2018-06-28 NOTE — Telephone Encounter (Signed)
Aaron Edelman, this is a message from Ms. Armentrout's Daughter, [name]-  Thank you for your note. So much has happened since I sent my note, that I had honestly forgotten about it.  After additional hospital stays, a broken hip, a month in PT Rehab, mom is now under [facility]. Her condition continues to decline. It is extremely helpful to have Hospice on board to help navigate these waters.  She will not allow me to let her friends know that she is under Hospice care. Therefore, when I am asked how she is doing, I use the words "she is stable for now." I appreciate you reaching out to me. We will certainly call if there is a need.   Jill Side

## 2018-07-05 ENCOUNTER — Ambulatory Visit (INDEPENDENT_AMBULATORY_CARE_PROVIDER_SITE_OTHER): Payer: Medicare Other

## 2018-07-05 DIAGNOSIS — I442 Atrioventricular block, complete: Secondary | ICD-10-CM

## 2018-07-05 DIAGNOSIS — I495 Sick sinus syndrome: Secondary | ICD-10-CM

## 2018-07-06 NOTE — Progress Notes (Signed)
Remote pacemaker transmission.   

## 2018-07-07 LAB — CUP PACEART REMOTE DEVICE CHECK
Brady Statistic AP VP Percent: 83 %
Brady Statistic AP VS Percent: 1 %
Brady Statistic AS VP Percent: 17 %
Brady Statistic AS VS Percent: 1 %
Brady Statistic RA Percent Paced: 22 %
Brady Statistic RV Percent Paced: 99 %
Date Time Interrogation Session: 20200116070015
Implantable Lead Implant Date: 19920213
Implantable Lead Location: 753859
Implantable Lead Serial Number: 23000302
Lead Channel Impedance Value: 390 Ohm
Lead Channel Pacing Threshold Amplitude: 0.75 V
Lead Channel Pacing Threshold Pulse Width: 0.5 ms
Lead Channel Sensing Intrinsic Amplitude: 8.7 mV
Lead Channel Setting Pacing Amplitude: 2.5 V
Lead Channel Setting Pacing Pulse Width: 0.5 ms
Lead Channel Setting Sensing Sensitivity: 4 mV
MDC IDC LEAD IMPLANT DT: 19920213
MDC IDC LEAD LOCATION: 753860
MDC IDC MSMT BATTERY REMAINING LONGEVITY: 92 mo
MDC IDC MSMT BATTERY REMAINING PERCENTAGE: 95.5 %
MDC IDC MSMT BATTERY VOLTAGE: 2.98 V
MDC IDC MSMT LEADCHNL RA IMPEDANCE VALUE: 390 Ohm
MDC IDC MSMT LEADCHNL RA PACING THRESHOLD AMPLITUDE: 0.75 V
MDC IDC MSMT LEADCHNL RA SENSING INTR AMPL: 0.5 mV
MDC IDC MSMT LEADCHNL RV PACING THRESHOLD PULSEWIDTH: 0.5 ms
MDC IDC PG IMPLANT DT: 20190220
MDC IDC SET LEADCHNL RV PACING AMPLITUDE: 2.5 V
Pulse Gen Model: 2272
Pulse Gen Serial Number: 8999181

## 2018-07-18 LAB — CUP PACEART INCLINIC DEVICE CHECK
Implantable Lead Implant Date: 19920213
Implantable Lead Location: 753859
Implantable Lead Location: 753860
Implantable Pulse Generator Implant Date: 20190220
MDC IDC LEAD IMPLANT DT: 19920213
MDC IDC LEAD SERIAL: 23000302
MDC IDC SESS DTM: 20200129152731
Pulse Gen Model: 2272
Pulse Gen Serial Number: 8999181

## 2018-08-16 DIAGNOSIS — R319 Hematuria, unspecified: Secondary | ICD-10-CM | POA: Diagnosis not present

## 2018-08-16 DIAGNOSIS — N390 Urinary tract infection, site not specified: Secondary | ICD-10-CM | POA: Diagnosis not present

## 2018-08-16 DIAGNOSIS — I517 Cardiomegaly: Secondary | ICD-10-CM | POA: Diagnosis not present

## 2018-09-04 NOTE — Telephone Encounter (Signed)
Error Violeta Gelinas NP-C

## 2018-09-19 DIAGNOSIS — F329 Major depressive disorder, single episode, unspecified: Secondary | ICD-10-CM | POA: Diagnosis not present

## 2018-09-19 DIAGNOSIS — I4891 Unspecified atrial fibrillation: Secondary | ICD-10-CM | POA: Diagnosis not present

## 2018-09-19 DIAGNOSIS — E43 Unspecified severe protein-calorie malnutrition: Secondary | ICD-10-CM | POA: Diagnosis not present

## 2018-09-19 DIAGNOSIS — J9610 Chronic respiratory failure, unspecified whether with hypoxia or hypercapnia: Secondary | ICD-10-CM | POA: Diagnosis not present

## 2018-09-19 DIAGNOSIS — I10 Essential (primary) hypertension: Secondary | ICD-10-CM | POA: Diagnosis not present

## 2018-09-19 DIAGNOSIS — R0902 Hypoxemia: Secondary | ICD-10-CM | POA: Diagnosis not present

## 2018-09-19 DIAGNOSIS — Z950 Presence of cardiac pacemaker: Secondary | ICD-10-CM | POA: Diagnosis not present

## 2018-09-19 DIAGNOSIS — J441 Chronic obstructive pulmonary disease with (acute) exacerbation: Secondary | ICD-10-CM | POA: Diagnosis not present

## 2018-09-19 DIAGNOSIS — I509 Heart failure, unspecified: Secondary | ICD-10-CM | POA: Diagnosis not present

## 2018-09-19 DIAGNOSIS — I442 Atrioventricular block, complete: Secondary | ICD-10-CM | POA: Diagnosis not present

## 2018-09-19 DIAGNOSIS — Z8781 Personal history of (healed) traumatic fracture: Secondary | ICD-10-CM | POA: Diagnosis not present

## 2018-09-19 DIAGNOSIS — Z86718 Personal history of other venous thrombosis and embolism: Secondary | ICD-10-CM | POA: Diagnosis not present

## 2018-09-19 DIAGNOSIS — M353 Polymyalgia rheumatica: Secondary | ICD-10-CM | POA: Diagnosis not present

## 2018-09-19 DIAGNOSIS — F0390 Unspecified dementia without behavioral disturbance: Secondary | ICD-10-CM | POA: Diagnosis not present

## 2018-09-20 DIAGNOSIS — I442 Atrioventricular block, complete: Secondary | ICD-10-CM | POA: Diagnosis not present

## 2018-09-20 DIAGNOSIS — I4891 Unspecified atrial fibrillation: Secondary | ICD-10-CM | POA: Diagnosis not present

## 2018-09-20 DIAGNOSIS — J441 Chronic obstructive pulmonary disease with (acute) exacerbation: Secondary | ICD-10-CM | POA: Diagnosis not present

## 2018-09-20 DIAGNOSIS — I509 Heart failure, unspecified: Secondary | ICD-10-CM | POA: Diagnosis not present

## 2018-09-20 DIAGNOSIS — R0902 Hypoxemia: Secondary | ICD-10-CM | POA: Diagnosis not present

## 2018-09-20 DIAGNOSIS — J9610 Chronic respiratory failure, unspecified whether with hypoxia or hypercapnia: Secondary | ICD-10-CM | POA: Diagnosis not present

## 2018-09-22 DIAGNOSIS — I442 Atrioventricular block, complete: Secondary | ICD-10-CM | POA: Diagnosis not present

## 2018-09-22 DIAGNOSIS — J441 Chronic obstructive pulmonary disease with (acute) exacerbation: Secondary | ICD-10-CM | POA: Diagnosis not present

## 2018-09-22 DIAGNOSIS — I4891 Unspecified atrial fibrillation: Secondary | ICD-10-CM | POA: Diagnosis not present

## 2018-09-22 DIAGNOSIS — R0902 Hypoxemia: Secondary | ICD-10-CM | POA: Diagnosis not present

## 2018-09-22 DIAGNOSIS — J9610 Chronic respiratory failure, unspecified whether with hypoxia or hypercapnia: Secondary | ICD-10-CM | POA: Diagnosis not present

## 2018-09-22 DIAGNOSIS — I509 Heart failure, unspecified: Secondary | ICD-10-CM | POA: Diagnosis not present

## 2018-10-04 ENCOUNTER — Other Ambulatory Visit: Payer: Self-pay

## 2018-10-04 ENCOUNTER — Ambulatory Visit (INDEPENDENT_AMBULATORY_CARE_PROVIDER_SITE_OTHER): Payer: Medicare Other | Admitting: *Deleted

## 2018-10-04 DIAGNOSIS — I495 Sick sinus syndrome: Secondary | ICD-10-CM | POA: Diagnosis not present

## 2018-10-04 LAB — CUP PACEART REMOTE DEVICE CHECK
Battery Remaining Longevity: 90 mo
Battery Remaining Percentage: 95.5 %
Battery Voltage: 2.98 V
Brady Statistic AP VP Percent: 76 %
Brady Statistic AP VS Percent: 1 %
Brady Statistic AS VP Percent: 24 %
Brady Statistic AS VS Percent: 1 %
Brady Statistic RA Percent Paced: 24 %
Brady Statistic RV Percent Paced: 99 %
Date Time Interrogation Session: 20200416060013
Implantable Lead Implant Date: 19920213
Implantable Lead Implant Date: 19920213
Implantable Lead Location: 753859
Implantable Lead Location: 753860
Implantable Lead Serial Number: 23000302
Implantable Pulse Generator Implant Date: 20190220
Lead Channel Impedance Value: 350 Ohm
Lead Channel Impedance Value: 380 Ohm
Lead Channel Sensing Intrinsic Amplitude: 0.6 mV
Lead Channel Setting Pacing Amplitude: 2.5 V
Lead Channel Setting Pacing Amplitude: 2.5 V
Lead Channel Setting Pacing Pulse Width: 0.5 ms
Lead Channel Setting Sensing Sensitivity: 4 mV
Pulse Gen Model: 2272
Pulse Gen Serial Number: 8999181

## 2018-10-11 ENCOUNTER — Encounter: Payer: Self-pay | Admitting: Cardiology

## 2018-10-11 NOTE — Progress Notes (Signed)
Remote pacemaker transmission.   

## 2018-10-16 DIAGNOSIS — I509 Heart failure, unspecified: Secondary | ICD-10-CM | POA: Diagnosis not present

## 2018-10-16 DIAGNOSIS — J441 Chronic obstructive pulmonary disease with (acute) exacerbation: Secondary | ICD-10-CM | POA: Diagnosis not present

## 2018-10-16 DIAGNOSIS — J9610 Chronic respiratory failure, unspecified whether with hypoxia or hypercapnia: Secondary | ICD-10-CM | POA: Diagnosis not present

## 2018-10-16 DIAGNOSIS — R0902 Hypoxemia: Secondary | ICD-10-CM | POA: Diagnosis not present

## 2018-10-16 DIAGNOSIS — I442 Atrioventricular block, complete: Secondary | ICD-10-CM | POA: Diagnosis not present

## 2018-10-16 DIAGNOSIS — I4891 Unspecified atrial fibrillation: Secondary | ICD-10-CM | POA: Diagnosis not present

## 2018-10-19 DIAGNOSIS — I4891 Unspecified atrial fibrillation: Secondary | ICD-10-CM | POA: Diagnosis not present

## 2018-10-19 DIAGNOSIS — Z9981 Dependence on supplemental oxygen: Secondary | ICD-10-CM | POA: Diagnosis not present

## 2018-10-19 DIAGNOSIS — I442 Atrioventricular block, complete: Secondary | ICD-10-CM | POA: Diagnosis not present

## 2018-10-19 DIAGNOSIS — Z950 Presence of cardiac pacemaker: Secondary | ICD-10-CM | POA: Diagnosis not present

## 2018-10-19 DIAGNOSIS — F0390 Unspecified dementia without behavioral disturbance: Secondary | ICD-10-CM | POA: Diagnosis not present

## 2018-10-19 DIAGNOSIS — F329 Major depressive disorder, single episode, unspecified: Secondary | ICD-10-CM | POA: Diagnosis not present

## 2018-10-19 DIAGNOSIS — Z86718 Personal history of other venous thrombosis and embolism: Secondary | ICD-10-CM | POA: Diagnosis not present

## 2018-10-19 DIAGNOSIS — S81811D Laceration without foreign body, right lower leg, subsequent encounter: Secondary | ICD-10-CM | POA: Diagnosis not present

## 2018-10-19 DIAGNOSIS — M353 Polymyalgia rheumatica: Secondary | ICD-10-CM | POA: Diagnosis not present

## 2018-10-19 DIAGNOSIS — I509 Heart failure, unspecified: Secondary | ICD-10-CM | POA: Diagnosis not present

## 2018-10-19 DIAGNOSIS — J9611 Chronic respiratory failure with hypoxia: Secondary | ICD-10-CM | POA: Diagnosis not present

## 2018-10-19 DIAGNOSIS — J441 Chronic obstructive pulmonary disease with (acute) exacerbation: Secondary | ICD-10-CM | POA: Diagnosis not present

## 2018-10-19 DIAGNOSIS — Z8781 Personal history of (healed) traumatic fracture: Secondary | ICD-10-CM | POA: Diagnosis not present

## 2018-10-19 DIAGNOSIS — I110 Hypertensive heart disease with heart failure: Secondary | ICD-10-CM | POA: Diagnosis not present

## 2018-10-19 DIAGNOSIS — E43 Unspecified severe protein-calorie malnutrition: Secondary | ICD-10-CM | POA: Diagnosis not present

## 2018-10-21 DIAGNOSIS — I110 Hypertensive heart disease with heart failure: Secondary | ICD-10-CM | POA: Diagnosis not present

## 2018-10-21 DIAGNOSIS — I442 Atrioventricular block, complete: Secondary | ICD-10-CM | POA: Diagnosis not present

## 2018-10-21 DIAGNOSIS — J441 Chronic obstructive pulmonary disease with (acute) exacerbation: Secondary | ICD-10-CM | POA: Diagnosis not present

## 2018-10-21 DIAGNOSIS — I509 Heart failure, unspecified: Secondary | ICD-10-CM | POA: Diagnosis not present

## 2018-10-21 DIAGNOSIS — J9611 Chronic respiratory failure with hypoxia: Secondary | ICD-10-CM | POA: Diagnosis not present

## 2018-10-21 DIAGNOSIS — I4891 Unspecified atrial fibrillation: Secondary | ICD-10-CM | POA: Diagnosis not present

## 2018-10-31 IMAGING — CT CT CHEST W/O CM
2 of 4 series · 15 of 36 positions shown, 18 images · non-contrast
Comparison: 04/22/2017.

CLINICAL DATA: Worsening shortness of breath.  Cough, COPD.

EXAM:
CT CHEST WITHOUT CONTRAST
TECHNIQUE: Multidetector CT imaging of the chest was performed following the
standard protocol without IV contrast.

[Series 2: thorax · axial · 0.71mm/px · z∈[-436,-162]mm · 12 of 161 slices shown, 15 images]
[im 12/161  mediastinal]
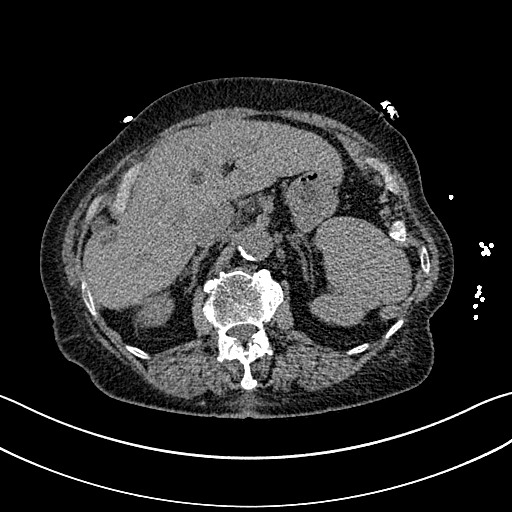
[im 12/161  lung]
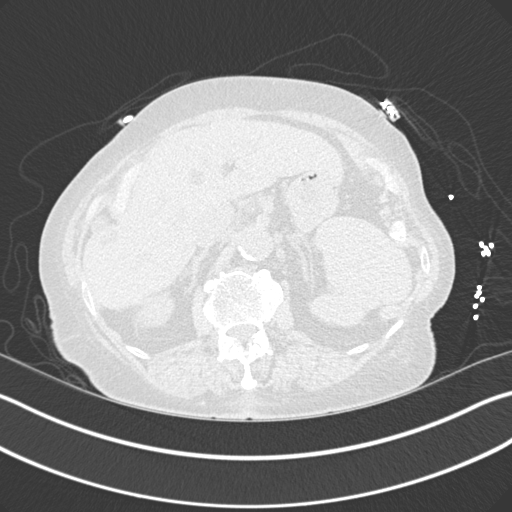
[im 23/161  lung]
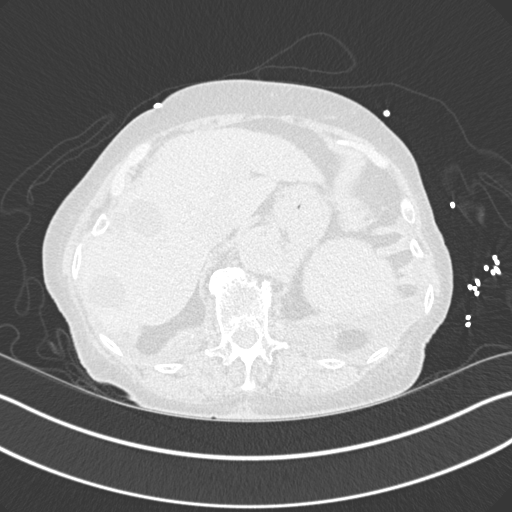
[im 35/161  lung]
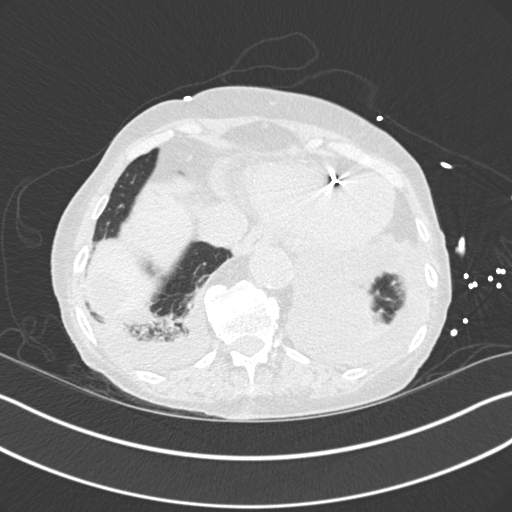
[im 46/161  lung]
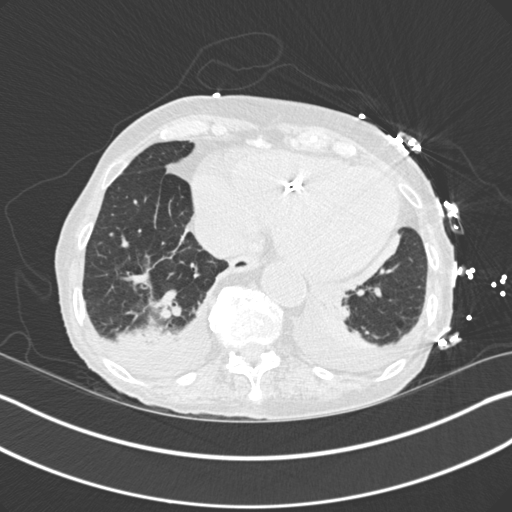
[im 58/161  mediastinal]
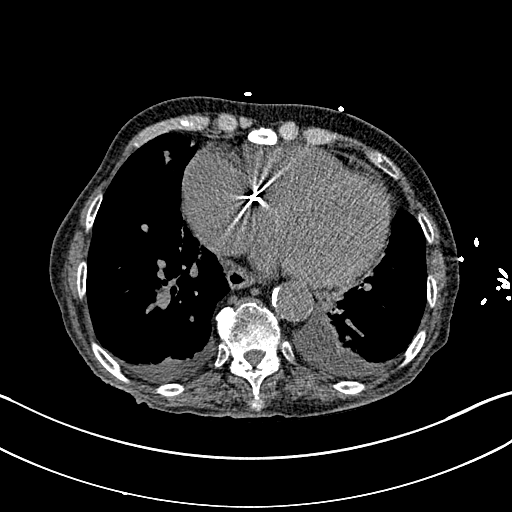
[im 58/161  lung]
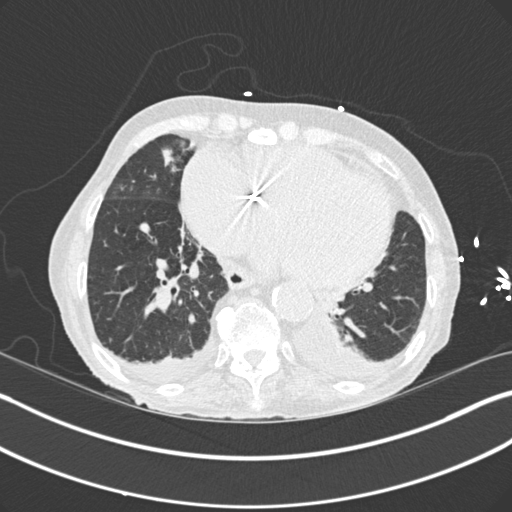
[im 69/161  lung]
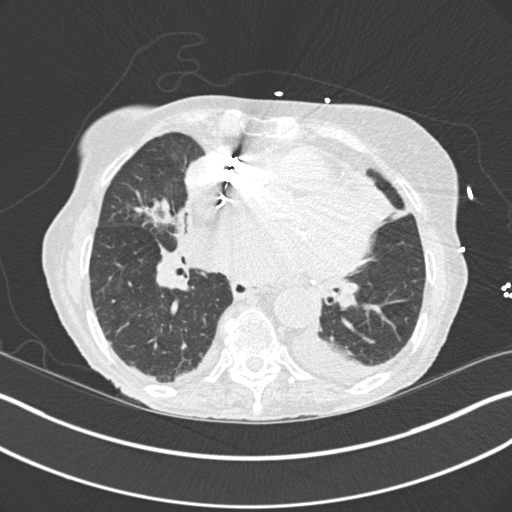
[im 92/161  lung]
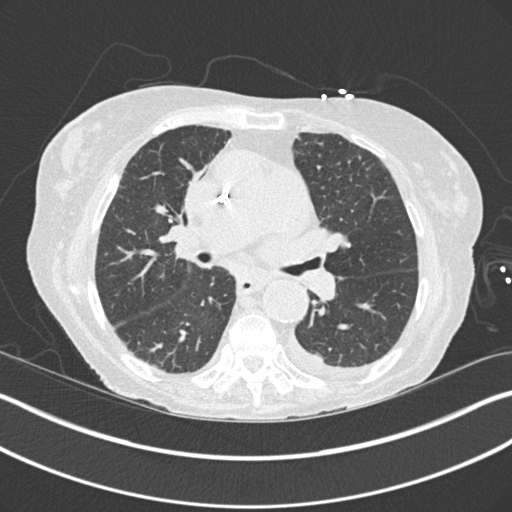
[im 103/161  lung]
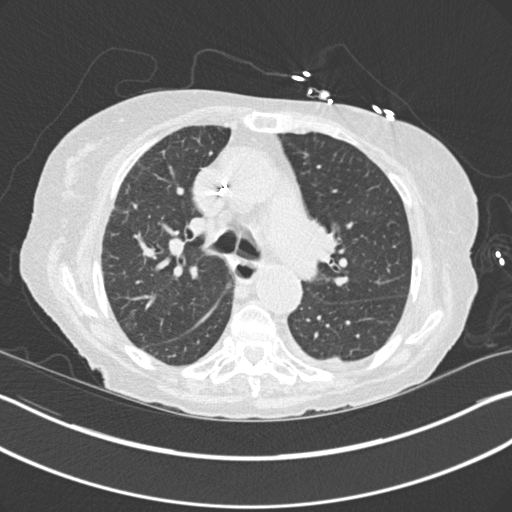
[im 115/161  mediastinal]
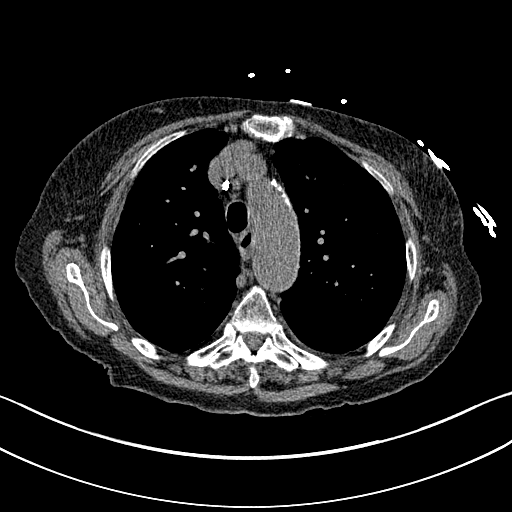
[im 115/161  lung]
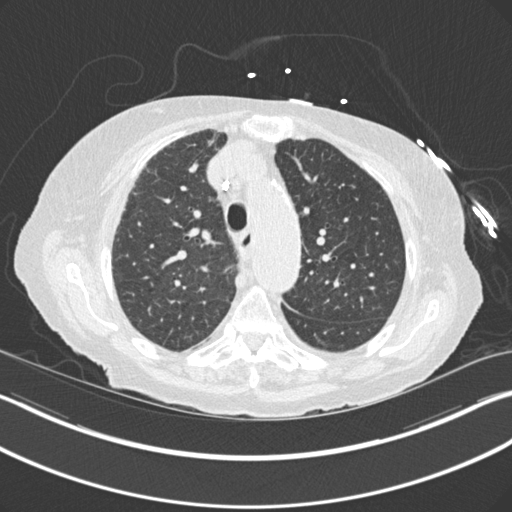
[im 126/161  lung]
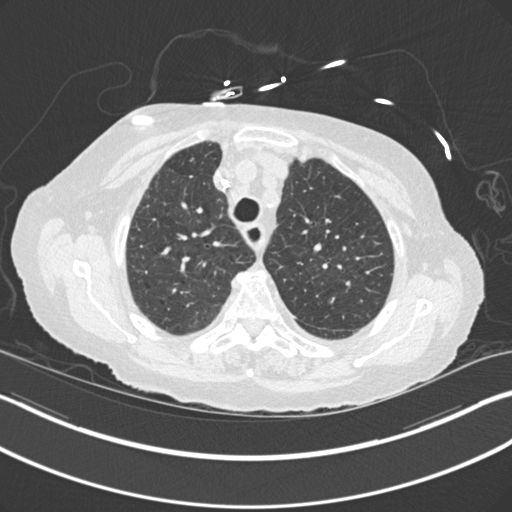
[im 138/161  lung]
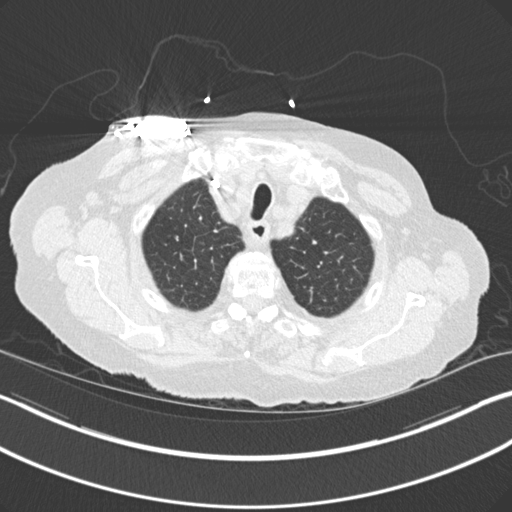
[im 149/161  lung]
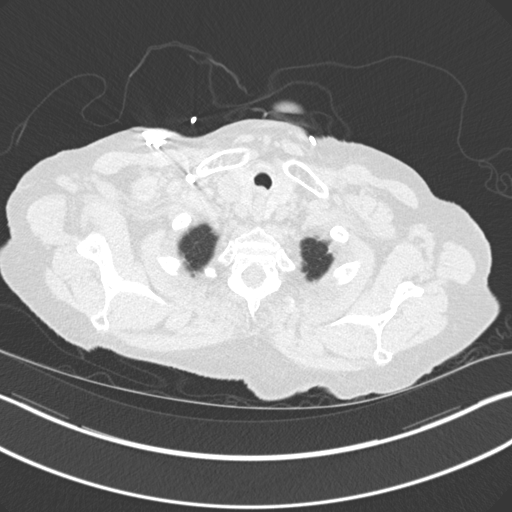

[Series 6: coronal · coronal · 0.67mm/px · 3 of 148 slices shown]
[im 30/148  lung]
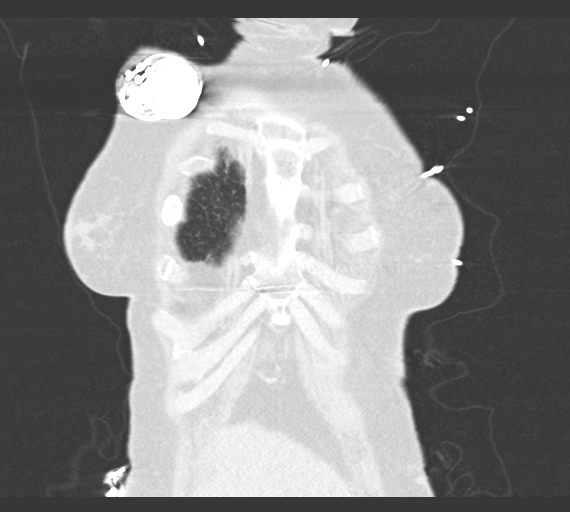
[im 59/148  lung]
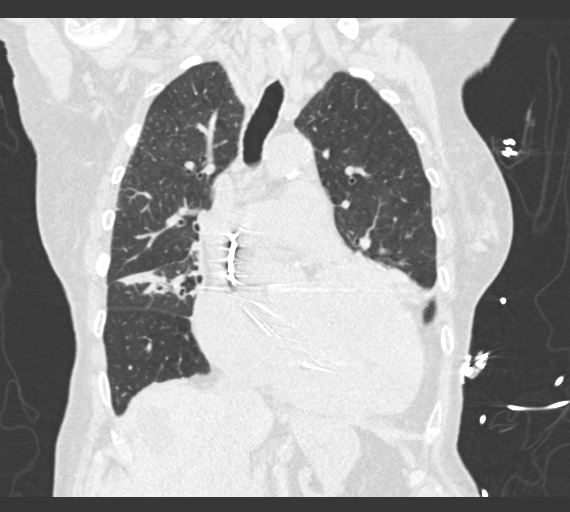
[im 89/148  lung]
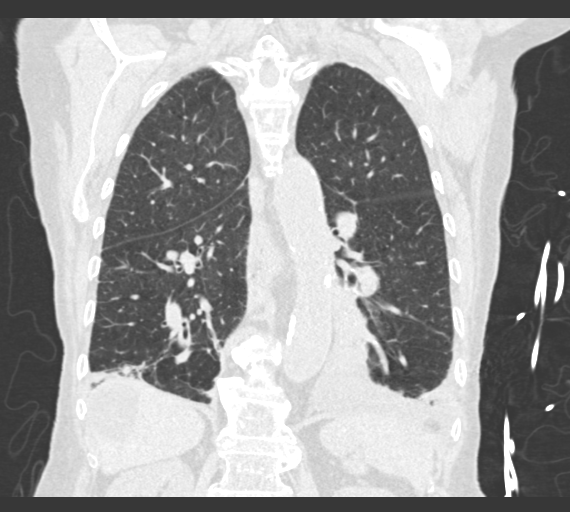

[15 of 36 positions shown; findings below may reference images not displayed]

FINDINGS: Cardiovascular: Atherosclerotic calcification of the arterial
vasculature, including three-vessel involvement of the coronary
arteries. Pulmonary arteries and heart are enlarged. No pericardial
effusion.

Mediastinum/Nodes: Mediastinal lymph nodes are not enlarged by CT
size criteria. Hilar regions are difficult to evaluate without IV
contrast. No axillary adenopathy. Esophagus is grossly unremarkable.

Lungs/Pleura: Mild centrilobular emphysema. Right middle lobe
collapse/consolidation. Mild smooth septal thickening with small
bilateral pleural effusions. Compressive atelectasis in both lower
lobes. There may be post infectious/postinflammatory scarring in the
peripheral right lower lobe. Mild peribronchial thickening.

Upper Abdomen: Numerous low-attenuation lesions are again seen in
the liver, measuring up to 3.4 cm, similar. Visualized portions of
the adrenal glands, kidneys, spleen and stomach are grossly
unremarkable. No upper abdominal adenopathy.

Musculoskeletal: Degenerative changes in the spine. T8 compression
fracture and T9 vertebral body augmentation are unchanged. No
worrisome lytic or sclerotic lesions.
IMPRESSION: 1. New collapse/consolidation in the right middle lobe. Difficult to
exclude a centrally obstructing lesion.
2. Mild septal thickening with bilateral pleural effusions,
indicative of congestive heart failure.
3. Aortic atherosclerosis ([id number]-170.0). Coronary artery
calcification.
4. Enlarged pulmonary arteries, indicative of pulmonary arterial
hypertension.

## 2018-11-07 DIAGNOSIS — I509 Heart failure, unspecified: Secondary | ICD-10-CM | POA: Diagnosis not present

## 2018-11-07 DIAGNOSIS — J441 Chronic obstructive pulmonary disease with (acute) exacerbation: Secondary | ICD-10-CM | POA: Diagnosis not present

## 2018-11-07 DIAGNOSIS — I110 Hypertensive heart disease with heart failure: Secondary | ICD-10-CM | POA: Diagnosis not present

## 2018-11-07 DIAGNOSIS — I442 Atrioventricular block, complete: Secondary | ICD-10-CM | POA: Diagnosis not present

## 2018-11-07 DIAGNOSIS — I4891 Unspecified atrial fibrillation: Secondary | ICD-10-CM | POA: Diagnosis not present

## 2018-11-07 DIAGNOSIS — J9611 Chronic respiratory failure with hypoxia: Secondary | ICD-10-CM | POA: Diagnosis not present

## 2018-11-08 DIAGNOSIS — I442 Atrioventricular block, complete: Secondary | ICD-10-CM | POA: Diagnosis not present

## 2018-11-08 DIAGNOSIS — J441 Chronic obstructive pulmonary disease with (acute) exacerbation: Secondary | ICD-10-CM | POA: Diagnosis not present

## 2018-11-08 DIAGNOSIS — M25532 Pain in left wrist: Secondary | ICD-10-CM | POA: Diagnosis not present

## 2018-11-08 DIAGNOSIS — J9611 Chronic respiratory failure with hypoxia: Secondary | ICD-10-CM | POA: Diagnosis not present

## 2018-11-08 DIAGNOSIS — I110 Hypertensive heart disease with heart failure: Secondary | ICD-10-CM | POA: Diagnosis not present

## 2018-11-08 DIAGNOSIS — I509 Heart failure, unspecified: Secondary | ICD-10-CM | POA: Diagnosis not present

## 2018-11-08 DIAGNOSIS — I4891 Unspecified atrial fibrillation: Secondary | ICD-10-CM | POA: Diagnosis not present

## 2018-11-12 DIAGNOSIS — I442 Atrioventricular block, complete: Secondary | ICD-10-CM | POA: Diagnosis not present

## 2018-11-12 DIAGNOSIS — I4891 Unspecified atrial fibrillation: Secondary | ICD-10-CM | POA: Diagnosis not present

## 2018-11-12 DIAGNOSIS — I110 Hypertensive heart disease with heart failure: Secondary | ICD-10-CM | POA: Diagnosis not present

## 2018-11-12 DIAGNOSIS — J9611 Chronic respiratory failure with hypoxia: Secondary | ICD-10-CM | POA: Diagnosis not present

## 2018-11-12 DIAGNOSIS — J441 Chronic obstructive pulmonary disease with (acute) exacerbation: Secondary | ICD-10-CM | POA: Diagnosis not present

## 2018-11-12 DIAGNOSIS — I509 Heart failure, unspecified: Secondary | ICD-10-CM | POA: Diagnosis not present

## 2018-11-16 ENCOUNTER — Telehealth: Payer: Self-pay | Admitting: Cardiovascular Disease

## 2018-11-16 ENCOUNTER — Other Ambulatory Visit: Payer: Self-pay

## 2018-11-16 NOTE — Telephone Encounter (Signed)
Called patient to schedule yearly w/ Dr C.  SW daughter.  Patient under hospice care at [facility]. Dementia has worsened and daughter does not feel virtual visit would work.Daughter can't get in facility to assist with downloads, etc.

## 2018-11-19 DIAGNOSIS — J9611 Chronic respiratory failure with hypoxia: Secondary | ICD-10-CM | POA: Diagnosis not present

## 2018-11-19 DIAGNOSIS — Z950 Presence of cardiac pacemaker: Secondary | ICD-10-CM | POA: Diagnosis not present

## 2018-11-19 DIAGNOSIS — I4891 Unspecified atrial fibrillation: Secondary | ICD-10-CM | POA: Diagnosis not present

## 2018-11-19 DIAGNOSIS — S81811D Laceration without foreign body, right lower leg, subsequent encounter: Secondary | ICD-10-CM | POA: Diagnosis not present

## 2018-11-19 DIAGNOSIS — I110 Hypertensive heart disease with heart failure: Secondary | ICD-10-CM | POA: Diagnosis not present

## 2018-11-19 DIAGNOSIS — Z86718 Personal history of other venous thrombosis and embolism: Secondary | ICD-10-CM | POA: Diagnosis not present

## 2018-11-19 DIAGNOSIS — Z9981 Dependence on supplemental oxygen: Secondary | ICD-10-CM | POA: Diagnosis not present

## 2018-11-19 DIAGNOSIS — F329 Major depressive disorder, single episode, unspecified: Secondary | ICD-10-CM | POA: Diagnosis not present

## 2018-11-19 DIAGNOSIS — J441 Chronic obstructive pulmonary disease with (acute) exacerbation: Secondary | ICD-10-CM | POA: Diagnosis not present

## 2018-11-19 DIAGNOSIS — E43 Unspecified severe protein-calorie malnutrition: Secondary | ICD-10-CM | POA: Diagnosis not present

## 2018-11-19 DIAGNOSIS — F0390 Unspecified dementia without behavioral disturbance: Secondary | ICD-10-CM | POA: Diagnosis not present

## 2018-11-19 DIAGNOSIS — Z8781 Personal history of (healed) traumatic fracture: Secondary | ICD-10-CM | POA: Diagnosis not present

## 2018-11-19 DIAGNOSIS — I442 Atrioventricular block, complete: Secondary | ICD-10-CM | POA: Diagnosis not present

## 2018-11-19 DIAGNOSIS — M353 Polymyalgia rheumatica: Secondary | ICD-10-CM | POA: Diagnosis not present

## 2018-11-19 DIAGNOSIS — I509 Heart failure, unspecified: Secondary | ICD-10-CM | POA: Diagnosis not present

## 2018-11-20 DIAGNOSIS — J9611 Chronic respiratory failure with hypoxia: Secondary | ICD-10-CM | POA: Diagnosis not present

## 2018-11-20 DIAGNOSIS — I442 Atrioventricular block, complete: Secondary | ICD-10-CM | POA: Diagnosis not present

## 2018-11-20 DIAGNOSIS — J441 Chronic obstructive pulmonary disease with (acute) exacerbation: Secondary | ICD-10-CM | POA: Diagnosis not present

## 2018-11-20 DIAGNOSIS — I110 Hypertensive heart disease with heart failure: Secondary | ICD-10-CM | POA: Diagnosis not present

## 2018-11-20 DIAGNOSIS — I509 Heart failure, unspecified: Secondary | ICD-10-CM | POA: Diagnosis not present

## 2018-11-20 DIAGNOSIS — I4891 Unspecified atrial fibrillation: Secondary | ICD-10-CM | POA: Diagnosis not present

## 2018-11-25 DIAGNOSIS — I442 Atrioventricular block, complete: Secondary | ICD-10-CM | POA: Diagnosis not present

## 2018-11-25 DIAGNOSIS — J9611 Chronic respiratory failure with hypoxia: Secondary | ICD-10-CM | POA: Diagnosis not present

## 2018-11-25 DIAGNOSIS — I110 Hypertensive heart disease with heart failure: Secondary | ICD-10-CM | POA: Diagnosis not present

## 2018-11-25 DIAGNOSIS — I509 Heart failure, unspecified: Secondary | ICD-10-CM | POA: Diagnosis not present

## 2018-11-25 DIAGNOSIS — J441 Chronic obstructive pulmonary disease with (acute) exacerbation: Secondary | ICD-10-CM | POA: Diagnosis not present

## 2018-11-25 DIAGNOSIS — I4891 Unspecified atrial fibrillation: Secondary | ICD-10-CM | POA: Diagnosis not present

## 2018-11-28 DIAGNOSIS — I4891 Unspecified atrial fibrillation: Secondary | ICD-10-CM | POA: Diagnosis not present

## 2018-11-28 DIAGNOSIS — I110 Hypertensive heart disease with heart failure: Secondary | ICD-10-CM | POA: Diagnosis not present

## 2018-11-28 DIAGNOSIS — J441 Chronic obstructive pulmonary disease with (acute) exacerbation: Secondary | ICD-10-CM | POA: Diagnosis not present

## 2018-11-28 DIAGNOSIS — I442 Atrioventricular block, complete: Secondary | ICD-10-CM | POA: Diagnosis not present

## 2018-11-28 DIAGNOSIS — J9611 Chronic respiratory failure with hypoxia: Secondary | ICD-10-CM | POA: Diagnosis not present

## 2018-11-28 DIAGNOSIS — I509 Heart failure, unspecified: Secondary | ICD-10-CM | POA: Diagnosis not present

## 2018-12-03 DIAGNOSIS — J9611 Chronic respiratory failure with hypoxia: Secondary | ICD-10-CM | POA: Diagnosis not present

## 2018-12-03 DIAGNOSIS — I442 Atrioventricular block, complete: Secondary | ICD-10-CM | POA: Diagnosis not present

## 2018-12-03 DIAGNOSIS — I110 Hypertensive heart disease with heart failure: Secondary | ICD-10-CM | POA: Diagnosis not present

## 2018-12-03 DIAGNOSIS — J441 Chronic obstructive pulmonary disease with (acute) exacerbation: Secondary | ICD-10-CM | POA: Diagnosis not present

## 2018-12-03 DIAGNOSIS — I509 Heart failure, unspecified: Secondary | ICD-10-CM | POA: Diagnosis not present

## 2018-12-03 DIAGNOSIS — I4891 Unspecified atrial fibrillation: Secondary | ICD-10-CM | POA: Diagnosis not present

## 2018-12-05 DIAGNOSIS — J9611 Chronic respiratory failure with hypoxia: Secondary | ICD-10-CM | POA: Diagnosis not present

## 2018-12-05 DIAGNOSIS — I4891 Unspecified atrial fibrillation: Secondary | ICD-10-CM | POA: Diagnosis not present

## 2018-12-05 DIAGNOSIS — I509 Heart failure, unspecified: Secondary | ICD-10-CM | POA: Diagnosis not present

## 2018-12-05 DIAGNOSIS — I442 Atrioventricular block, complete: Secondary | ICD-10-CM | POA: Diagnosis not present

## 2018-12-05 DIAGNOSIS — J441 Chronic obstructive pulmonary disease with (acute) exacerbation: Secondary | ICD-10-CM | POA: Diagnosis not present

## 2018-12-05 DIAGNOSIS — I110 Hypertensive heart disease with heart failure: Secondary | ICD-10-CM | POA: Diagnosis not present

## 2018-12-06 DIAGNOSIS — I442 Atrioventricular block, complete: Secondary | ICD-10-CM | POA: Diagnosis not present

## 2018-12-06 DIAGNOSIS — J9611 Chronic respiratory failure with hypoxia: Secondary | ICD-10-CM | POA: Diagnosis not present

## 2018-12-06 DIAGNOSIS — J441 Chronic obstructive pulmonary disease with (acute) exacerbation: Secondary | ICD-10-CM | POA: Diagnosis not present

## 2018-12-06 DIAGNOSIS — I509 Heart failure, unspecified: Secondary | ICD-10-CM | POA: Diagnosis not present

## 2018-12-06 DIAGNOSIS — I4891 Unspecified atrial fibrillation: Secondary | ICD-10-CM | POA: Diagnosis not present

## 2018-12-06 DIAGNOSIS — I110 Hypertensive heart disease with heart failure: Secondary | ICD-10-CM | POA: Diagnosis not present

## 2018-12-07 DIAGNOSIS — I110 Hypertensive heart disease with heart failure: Secondary | ICD-10-CM | POA: Diagnosis not present

## 2018-12-07 DIAGNOSIS — I4891 Unspecified atrial fibrillation: Secondary | ICD-10-CM | POA: Diagnosis not present

## 2018-12-07 DIAGNOSIS — J441 Chronic obstructive pulmonary disease with (acute) exacerbation: Secondary | ICD-10-CM | POA: Diagnosis not present

## 2018-12-07 DIAGNOSIS — J9611 Chronic respiratory failure with hypoxia: Secondary | ICD-10-CM | POA: Diagnosis not present

## 2018-12-07 DIAGNOSIS — I442 Atrioventricular block, complete: Secondary | ICD-10-CM | POA: Diagnosis not present

## 2018-12-07 DIAGNOSIS — I509 Heart failure, unspecified: Secondary | ICD-10-CM | POA: Diagnosis not present

## 2018-12-10 ENCOUNTER — Telehealth: Payer: Self-pay | Admitting: Cardiovascular Disease

## 2018-12-10 NOTE — Telephone Encounter (Signed)
Spoke with pt dtr, aware to just throw monitor away.

## 2018-12-10 NOTE — Telephone Encounter (Signed)
New Message     [name] is calling and says the pt passed away on 01-01-2019  She is wondering what she should do with the Monitor     Please call

## 2018-12-19 DEATH — deceased
